# Patient Record
Sex: Female | Born: 1935 | Race: White | Hispanic: No | Smoking: Former smoker
Health system: Southern US, Community
[De-identification: ages and names within clinical notes are randomized; demographics above are authoritative.]

## PROBLEM LIST (undated history)

## (undated) DIAGNOSIS — I4891 Unspecified atrial fibrillation: Secondary | ICD-10-CM

## (undated) DIAGNOSIS — M1990 Unspecified osteoarthritis, unspecified site: Secondary | ICD-10-CM

## (undated) DIAGNOSIS — Z5189 Encounter for other specified aftercare: Secondary | ICD-10-CM

## (undated) DIAGNOSIS — K589 Irritable bowel syndrome without diarrhea: Secondary | ICD-10-CM

## (undated) DIAGNOSIS — R001 Bradycardia, unspecified: Secondary | ICD-10-CM

## (undated) DIAGNOSIS — I10 Essential (primary) hypertension: Secondary | ICD-10-CM

## (undated) DIAGNOSIS — E039 Hypothyroidism, unspecified: Secondary | ICD-10-CM

## (undated) DIAGNOSIS — K5790 Diverticulosis of intestine, part unspecified, without perforation or abscess without bleeding: Secondary | ICD-10-CM

## (undated) DIAGNOSIS — E785 Hyperlipidemia, unspecified: Secondary | ICD-10-CM

## (undated) DIAGNOSIS — N390 Urinary tract infection, site not specified: Secondary | ICD-10-CM

## (undated) DIAGNOSIS — K635 Polyp of colon: Secondary | ICD-10-CM

## (undated) DIAGNOSIS — M810 Age-related osteoporosis without current pathological fracture: Secondary | ICD-10-CM

## (undated) DIAGNOSIS — Z950 Presence of cardiac pacemaker: Secondary | ICD-10-CM

## (undated) HISTORY — DX: Essential (primary) hypertension: I10

## (undated) HISTORY — DX: Age-related osteoporosis without current pathological fracture: M81.0

## (undated) HISTORY — PX: COLONOSCOPY: SHX174

## (undated) HISTORY — DX: Encounter for other specified aftercare: Z51.89

## (undated) HISTORY — DX: Hypothyroidism, unspecified: E03.9

## (undated) HISTORY — DX: Bradycardia, unspecified: R00.1

## (undated) HISTORY — DX: Hyperlipidemia, unspecified: E78.5

## (undated) HISTORY — DX: Polyp of colon: K63.5

## (undated) HISTORY — DX: Urinary tract infection, site not specified: N39.0

## (undated) HISTORY — PX: OTHER SURGICAL HISTORY: SHX169

## (undated) HISTORY — DX: Diverticulosis of intestine, part unspecified, without perforation or abscess without bleeding: K57.90

## (undated) HISTORY — DX: Unspecified atrial fibrillation: I48.91

## (undated) HISTORY — DX: Irritable bowel syndrome, unspecified: K58.9

## (undated) HISTORY — DX: Presence of cardiac pacemaker: Z95.0

## (undated) HISTORY — DX: Unspecified osteoarthritis, unspecified site: M19.90

## (undated) HISTORY — PX: EYE SURGERY: SHX253

## (undated) HISTORY — PX: BREAST CYST ASPIRATION: SHX578

---

## 1993-03-18 ENCOUNTER — Encounter: Payer: Self-pay | Admitting: Internal Medicine

## 1998-04-01 ENCOUNTER — Ambulatory Visit (HOSPITAL_COMMUNITY): Admission: RE | Admit: 1998-04-01 | Discharge: 1998-04-01 | Payer: Self-pay | Admitting: Gastroenterology

## 1999-03-06 ENCOUNTER — Encounter: Admission: RE | Admit: 1999-03-06 | Discharge: 1999-03-06 | Payer: Self-pay | Admitting: Obstetrics and Gynecology

## 1999-03-06 ENCOUNTER — Encounter: Payer: Self-pay | Admitting: Obstetrics and Gynecology

## 1999-04-09 ENCOUNTER — Other Ambulatory Visit: Admission: RE | Admit: 1999-04-09 | Discharge: 1999-04-09 | Payer: Self-pay | Admitting: Obstetrics and Gynecology

## 2000-03-07 ENCOUNTER — Encounter: Admission: RE | Admit: 2000-03-07 | Discharge: 2000-03-07 | Payer: Self-pay | Admitting: Obstetrics and Gynecology

## 2000-03-07 ENCOUNTER — Encounter: Payer: Self-pay | Admitting: Obstetrics and Gynecology

## 2000-05-10 ENCOUNTER — Other Ambulatory Visit: Admission: RE | Admit: 2000-05-10 | Discharge: 2000-05-10 | Payer: Self-pay | Admitting: Obstetrics and Gynecology

## 2001-03-09 ENCOUNTER — Encounter: Admission: RE | Admit: 2001-03-09 | Discharge: 2001-03-09 | Payer: Self-pay | Admitting: Obstetrics and Gynecology

## 2001-03-09 ENCOUNTER — Encounter: Payer: Self-pay | Admitting: Obstetrics and Gynecology

## 2001-05-11 ENCOUNTER — Other Ambulatory Visit: Admission: RE | Admit: 2001-05-11 | Discharge: 2001-05-11 | Payer: Self-pay | Admitting: Obstetrics and Gynecology

## 2002-02-14 ENCOUNTER — Encounter: Payer: Self-pay | Admitting: Obstetrics and Gynecology

## 2002-02-14 ENCOUNTER — Encounter: Admission: RE | Admit: 2002-02-14 | Discharge: 2002-02-14 | Payer: Self-pay | Admitting: Obstetrics and Gynecology

## 2002-05-28 ENCOUNTER — Ambulatory Visit (HOSPITAL_COMMUNITY): Admission: RE | Admit: 2002-05-28 | Discharge: 2002-05-28 | Payer: Self-pay | Admitting: Ophthalmology

## 2002-05-28 ENCOUNTER — Encounter: Payer: Self-pay | Admitting: Ophthalmology

## 2003-02-18 ENCOUNTER — Encounter: Admission: RE | Admit: 2003-02-18 | Discharge: 2003-02-18 | Payer: Self-pay | Admitting: Obstetrics and Gynecology

## 2003-02-21 ENCOUNTER — Encounter: Admission: RE | Admit: 2003-02-21 | Discharge: 2003-02-21 | Payer: Self-pay | Admitting: Obstetrics and Gynecology

## 2004-03-05 ENCOUNTER — Encounter: Admission: RE | Admit: 2004-03-05 | Discharge: 2004-03-05 | Payer: Self-pay | Admitting: Obstetrics and Gynecology

## 2004-12-13 ENCOUNTER — Encounter: Admission: RE | Admit: 2004-12-13 | Discharge: 2004-12-13 | Payer: Self-pay | Admitting: Cardiology

## 2004-12-14 ENCOUNTER — Ambulatory Visit: Payer: Self-pay | Admitting: Internal Medicine

## 2005-03-08 ENCOUNTER — Encounter: Admission: RE | Admit: 2005-03-08 | Discharge: 2005-03-08 | Payer: Self-pay | Admitting: Obstetrics and Gynecology

## 2006-03-09 ENCOUNTER — Encounter: Admission: RE | Admit: 2006-03-09 | Discharge: 2006-03-09 | Payer: Self-pay | Admitting: Obstetrics and Gynecology

## 2007-02-03 ENCOUNTER — Emergency Department (HOSPITAL_COMMUNITY): Admission: EM | Admit: 2007-02-03 | Discharge: 2007-02-03 | Payer: Self-pay | Admitting: Family Medicine

## 2007-03-13 ENCOUNTER — Encounter: Admission: RE | Admit: 2007-03-13 | Discharge: 2007-03-13 | Payer: Self-pay | Admitting: Obstetrics and Gynecology

## 2007-05-12 ENCOUNTER — Ambulatory Visit: Payer: Self-pay | Admitting: Internal Medicine

## 2008-02-09 DIAGNOSIS — Z950 Presence of cardiac pacemaker: Secondary | ICD-10-CM

## 2008-02-09 HISTORY — DX: Presence of cardiac pacemaker: Z95.0

## 2008-02-09 HISTORY — PX: PACEMAKER INSERTION: SHX728

## 2008-03-19 ENCOUNTER — Encounter: Admission: RE | Admit: 2008-03-19 | Discharge: 2008-03-19 | Payer: Self-pay | Admitting: Obstetrics and Gynecology

## 2008-06-21 ENCOUNTER — Encounter: Admission: RE | Admit: 2008-06-21 | Discharge: 2008-06-21 | Payer: Self-pay | Admitting: Orthopaedic Surgery

## 2008-07-02 DIAGNOSIS — K5730 Diverticulosis of large intestine without perforation or abscess without bleeding: Secondary | ICD-10-CM | POA: Insufficient documentation

## 2008-07-02 DIAGNOSIS — E079 Disorder of thyroid, unspecified: Secondary | ICD-10-CM | POA: Insufficient documentation

## 2008-07-03 ENCOUNTER — Ambulatory Visit: Payer: Self-pay | Admitting: Internal Medicine

## 2008-07-03 DIAGNOSIS — R194 Change in bowel habit: Secondary | ICD-10-CM | POA: Insufficient documentation

## 2008-07-03 DIAGNOSIS — D689 Coagulation defect, unspecified: Secondary | ICD-10-CM | POA: Insufficient documentation

## 2008-07-03 DIAGNOSIS — R198 Other specified symptoms and signs involving the digestive system and abdomen: Secondary | ICD-10-CM | POA: Insufficient documentation

## 2008-07-03 DIAGNOSIS — K5900 Constipation, unspecified: Secondary | ICD-10-CM | POA: Insufficient documentation

## 2008-07-15 ENCOUNTER — Telehealth: Payer: Self-pay | Admitting: Internal Medicine

## 2008-07-17 ENCOUNTER — Ambulatory Visit: Payer: Self-pay | Admitting: Internal Medicine

## 2008-07-17 ENCOUNTER — Encounter: Payer: Self-pay | Admitting: Internal Medicine

## 2008-07-19 ENCOUNTER — Encounter: Payer: Self-pay | Admitting: Internal Medicine

## 2008-10-25 ENCOUNTER — Encounter: Admission: RE | Admit: 2008-10-25 | Discharge: 2008-10-25 | Payer: Self-pay | Admitting: Cardiology

## 2008-11-03 ENCOUNTER — Emergency Department (HOSPITAL_COMMUNITY): Admission: EM | Admit: 2008-11-03 | Discharge: 2008-11-04 | Payer: Self-pay | Admitting: Emergency Medicine

## 2008-11-12 ENCOUNTER — Telehealth: Payer: Self-pay | Admitting: Internal Medicine

## 2008-11-13 ENCOUNTER — Ambulatory Visit: Payer: Self-pay | Admitting: Gastroenterology

## 2008-11-13 DIAGNOSIS — R141 Gas pain: Secondary | ICD-10-CM | POA: Insufficient documentation

## 2008-11-13 DIAGNOSIS — R143 Flatulence: Secondary | ICD-10-CM

## 2008-11-13 DIAGNOSIS — R142 Eructation: Secondary | ICD-10-CM

## 2008-11-13 DIAGNOSIS — K5900 Constipation, unspecified: Secondary | ICD-10-CM | POA: Insufficient documentation

## 2008-11-14 ENCOUNTER — Telehealth: Payer: Self-pay | Admitting: Physician Assistant

## 2008-11-15 LAB — CONVERTED CEMR LAB
Calcium: 9.3 mg/dL (ref 8.4–10.5)
Eosinophils Absolute: 0.1 10*3/uL (ref 0.0–0.7)
GFR calc non Af Amer: 65.15 mL/min (ref >60)
HCT: 39.1 % (ref 36.0–46.0)
Hemoglobin: 13.2 g/dL (ref 12.0–15.0)
MCHC: 33.8 g/dL (ref 30.0–36.0)
Monocytes Relative: 14.7 % — ABNORMAL HIGH (ref 3.0–12.0)
Neutrophils Relative %: 61.5 % (ref 43.0–77.0)
Platelets: 247 10*3/uL (ref 150.0–400.0)
RBC: 4.28 M/uL (ref 3.87–5.11)
RDW: 12.5 % (ref 11.5–14.6)
WBC: 7.4 10*3/uL (ref 4.5–10.5)

## 2008-12-11 ENCOUNTER — Ambulatory Visit: Payer: Self-pay | Admitting: Internal Medicine

## 2008-12-11 DIAGNOSIS — K589 Irritable bowel syndrome without diarrhea: Secondary | ICD-10-CM | POA: Insufficient documentation

## 2008-12-11 DIAGNOSIS — Z860100 Personal history of colon polyps, unspecified: Secondary | ICD-10-CM | POA: Insufficient documentation

## 2008-12-11 DIAGNOSIS — I495 Sick sinus syndrome: Secondary | ICD-10-CM | POA: Insufficient documentation

## 2008-12-13 ENCOUNTER — Ambulatory Visit: Payer: Self-pay | Admitting: Cardiovascular Disease

## 2008-12-17 ENCOUNTER — Observation Stay (HOSPITAL_COMMUNITY): Admission: AD | Admit: 2008-12-17 | Discharge: 2008-12-18 | Payer: Self-pay | Admitting: Cardiology

## 2009-03-27 ENCOUNTER — Encounter: Admission: RE | Admit: 2009-03-27 | Discharge: 2009-03-27 | Payer: Self-pay | Admitting: Obstetrics and Gynecology

## 2009-09-25 ENCOUNTER — Ambulatory Visit: Payer: Self-pay | Admitting: Cardiology

## 2009-10-24 ENCOUNTER — Encounter: Payer: Self-pay | Admitting: Internal Medicine

## 2009-10-29 ENCOUNTER — Ambulatory Visit: Payer: Self-pay | Admitting: Cardiology

## 2009-11-12 ENCOUNTER — Ambulatory Visit: Payer: Self-pay | Admitting: Cardiology

## 2009-11-13 ENCOUNTER — Ambulatory Visit: Payer: Self-pay | Admitting: Cardiology

## 2009-12-09 ENCOUNTER — Ambulatory Visit: Payer: Self-pay | Admitting: Cardiology

## 2009-12-09 IMAGING — CR DG CHEST 2V
2 series · 2 of 2 positions shown · non-contrast
Comparison: 11/04/2008.

CLINICAL DATA: 75-year-old female with pacemaker placement.

CHEST - 2 VIEW

[w chest pa]
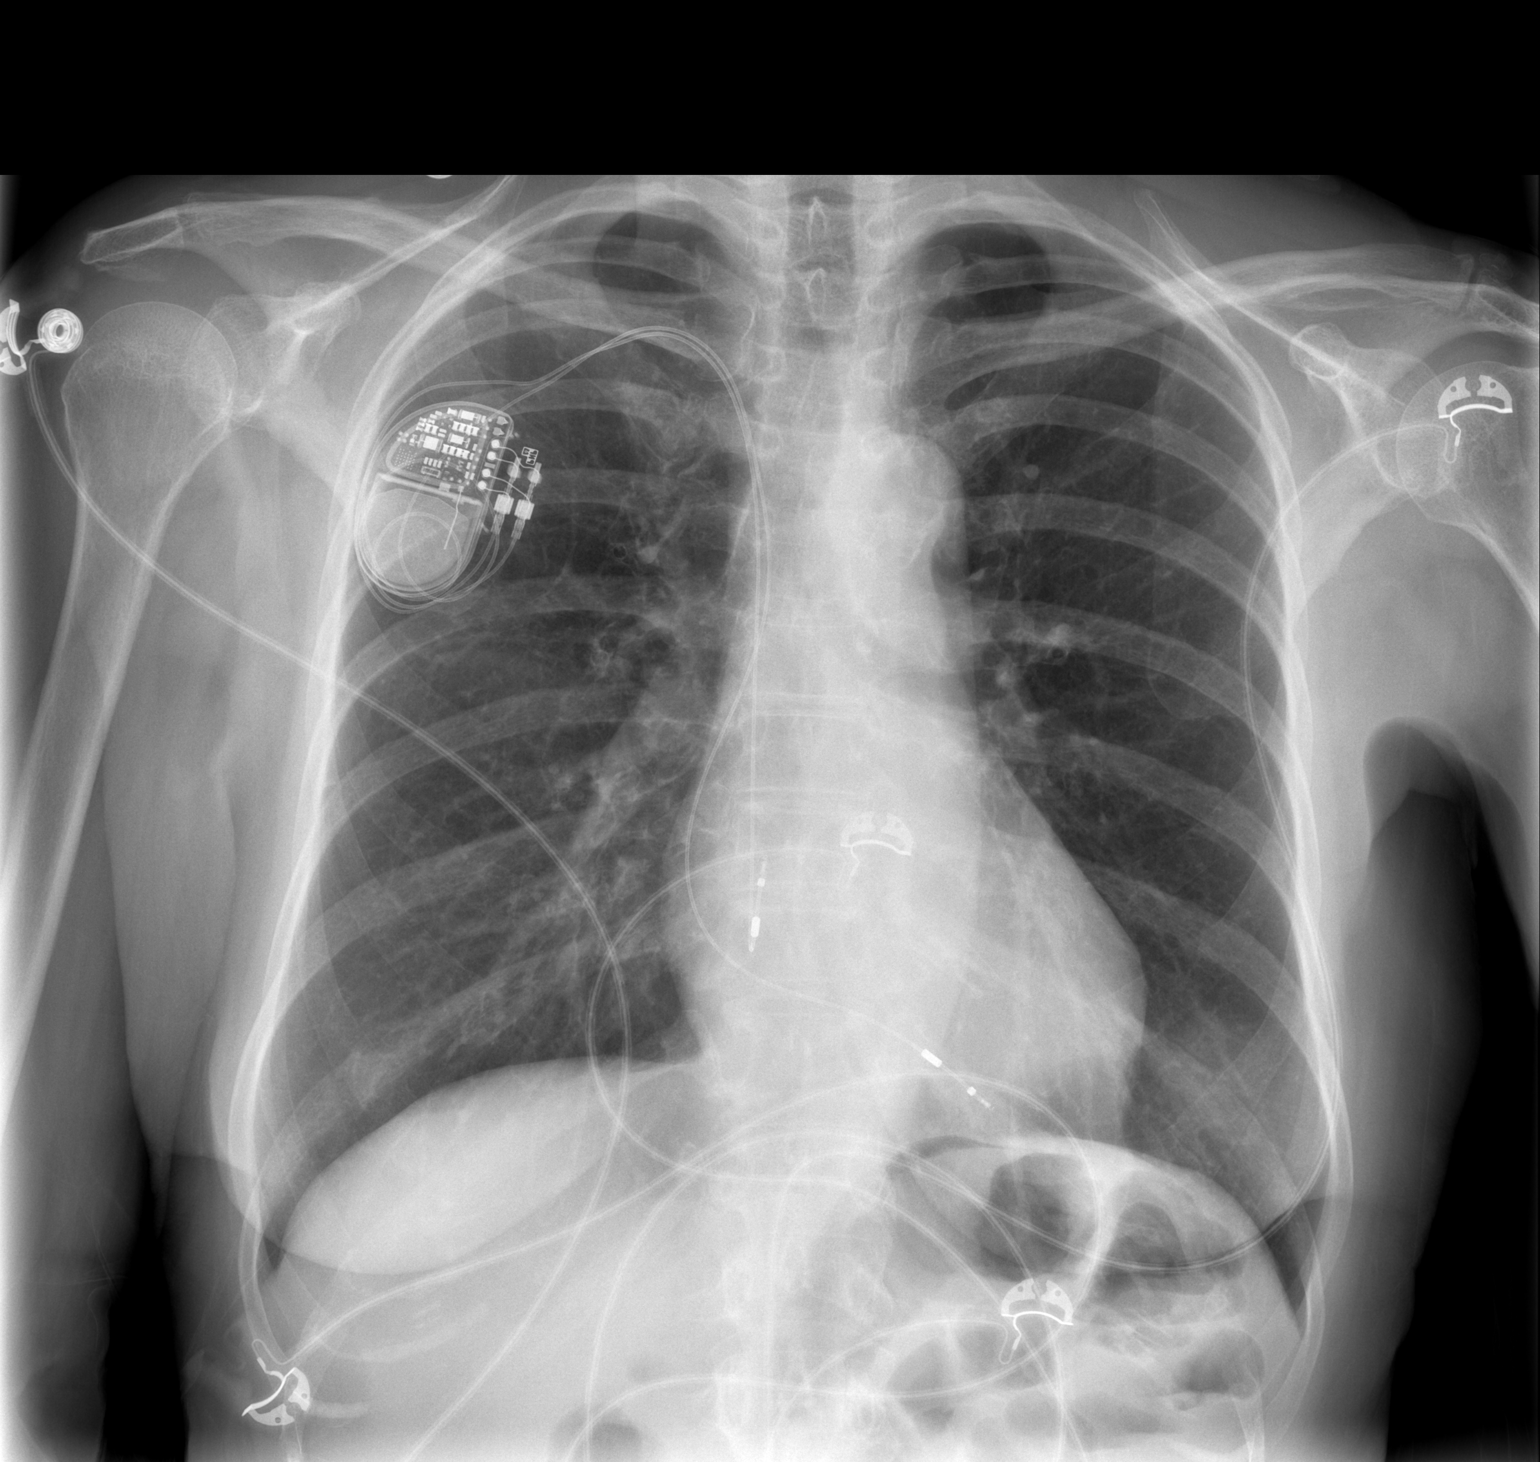

[w chest lat]
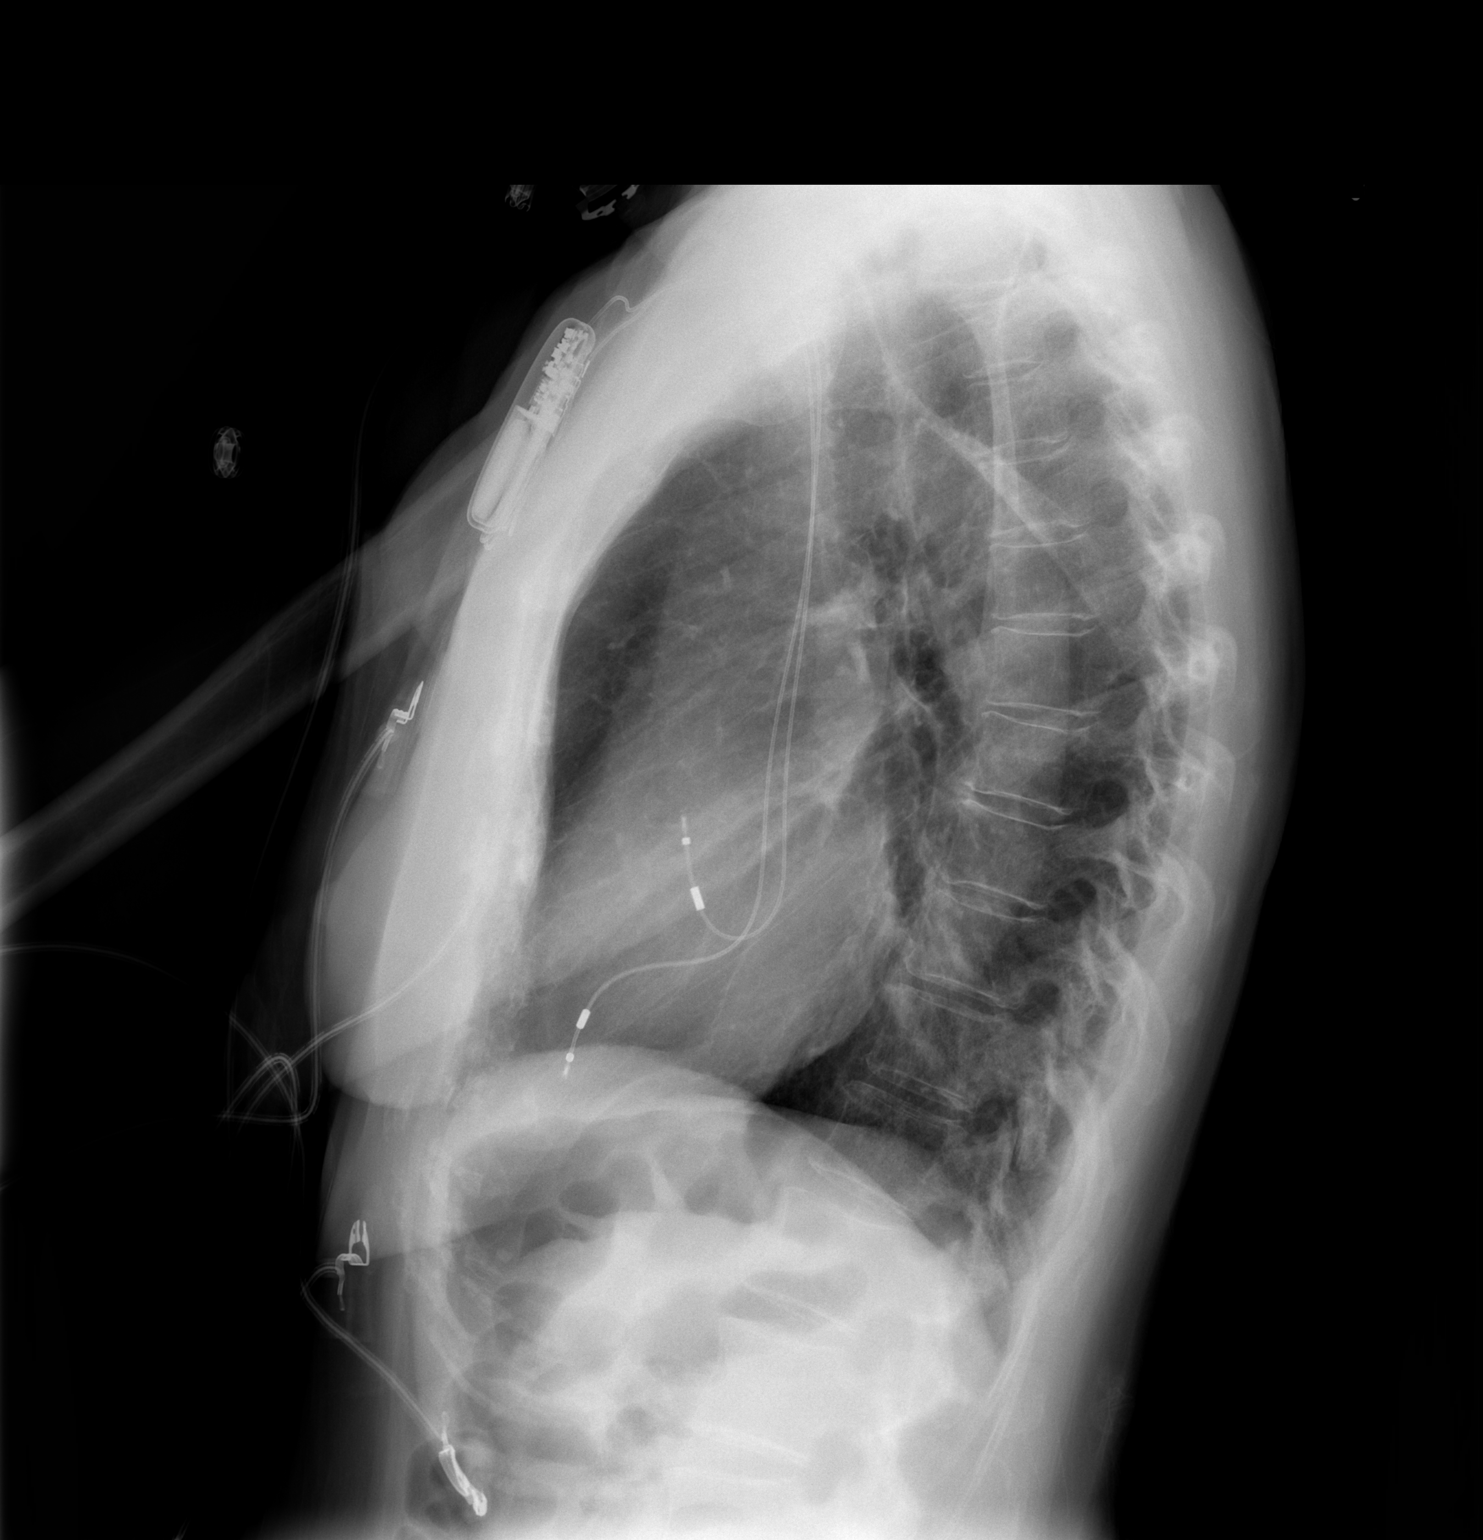

[2 of 2 positions shown; findings below may reference images not displayed]

FINDINGS: New right chest dual lead transvenous cardiac pacemaker.
No adverse features.  No pneumothorax. Stable cardiomegaly and
mediastinal contours.  No pulmonary edema, pleural effusion, or
acute airspace opacity. Stable visualized osseous structures.
IMPRESSION: 1.  Right chest cardiac pacemaker.  No adverse features.
2. No acute cardiopulmonary abnormality.

## 2010-01-05 ENCOUNTER — Encounter: Admission: RE | Admit: 2010-01-05 | Discharge: 2010-01-05 | Payer: Self-pay | Admitting: Internal Medicine

## 2010-01-09 ENCOUNTER — Ambulatory Visit: Payer: Self-pay | Admitting: Cardiology

## 2010-01-26 ENCOUNTER — Ambulatory Visit: Payer: Self-pay | Admitting: Cardiology

## 2010-01-29 ENCOUNTER — Telehealth (INDEPENDENT_AMBULATORY_CARE_PROVIDER_SITE_OTHER): Payer: Self-pay | Admitting: *Deleted

## 2010-01-29 ENCOUNTER — Ambulatory Visit: Payer: Self-pay | Admitting: Internal Medicine

## 2010-02-16 ENCOUNTER — Ambulatory Visit: Payer: Self-pay | Admitting: Cardiology

## 2010-02-18 ENCOUNTER — Encounter (INDEPENDENT_AMBULATORY_CARE_PROVIDER_SITE_OTHER): Payer: Self-pay | Admitting: *Deleted

## 2010-02-28 ENCOUNTER — Other Ambulatory Visit: Payer: Self-pay | Admitting: Obstetrics and Gynecology

## 2010-02-28 DIAGNOSIS — Z1239 Encounter for other screening for malignant neoplasm of breast: Secondary | ICD-10-CM

## 2010-03-10 NOTE — Miscellaneous (Signed)
Summary: Device preload  Clinical Lists Changes  Observations: Added new observation of PPM INDICATN: Sick sinus syndrome (10/24/2009 16:45) Added new observation of MAGNET RTE: BOL 85 ERI 65 (10/24/2009 16:45) Added new observation of PPMLEADSTAT2: active (10/24/2009 16:45) Added new observation of PPMLEADSER2: 161096  (10/24/2009 16:45) Added new observation of PPMLEADMOD2: 4470  (10/24/2009 16:45) Added new observation of PPMLEADDOI2: 12/17/2008  (10/24/2009 16:45) Added new observation of PPMLEADLOC2: 4470  (10/24/2009 16:45) Added new observation of PPMLEADSTAT1: active  (10/24/2009 16:45) Added new observation of PPMLEADSER1: 045409  (10/24/2009 16:45) Added new observation of PPMLEADMOD1: 4469  (10/24/2009 16:45) Added new observation of PPMLEADDOI1: 12/17/2008  (10/24/2009 16:45) Added new observation of PPMLEADLOC1: RA  (10/24/2009 16:45) Added new observation of PPM IMP MD: Duffy Rhody Tennant,MD  (10/24/2009 16:45) Added new observation of PPM DOI: 12/17/2008  (10/24/2009 16:45) Added new observation of PPM SERL#: [id number]  (10/24/2009 16:45) Added new observation of PPM MODL#: [id number]  (10/24/2009 16:45) Added new observation of PACEMAKERMFG: Medtronic  (10/24/2009 16:45) Added new observation of PACEMAKER MD: Hillis Range, MD  (10/24/2009 16:45)      PPM Specifications Following MD:  Hillis Range, MD     PPM Vendor:  Medtronic     PPM Model Number:  [id number]     PPM Serial Number:  [id number] PPM DOI:  12/17/2008     PPM Implanting MD:  Rolla Plate  Lead 1    Location: RA     DOI: 12/17/2008     Model #: 4469     Serial #: [id number]     Status: active Lead 2    Location: 4470     DOI: 12/17/2008     Model #: 4470     Serial #: [id number]     Status: active  Magnet Response Rate:  BOL 85 ERI 65  Indications:  Sick sinus syndrome

## 2010-03-12 NOTE — Progress Notes (Signed)
Summary: question transmission  Phone Note Call from Patient Call back at Home Phone [phone]   Caller: Patient Reason for Call: Talk to Nurse Summary of Call: pt has question re transmission. Initial call taken by: Roe Coombs,  January 29, 2010 2:07 PM  Follow-up for Phone Call        transmission recieved, patient aware. Follow-up by: Altha Harm, LPN,  January 30, 2010 8:18 AM

## 2010-03-12 NOTE — Cardiovascular Report (Signed)
Summary: Office Visit Remote   Office Visit Remote   Imported By: Roderic Ovens 02/20/2010 10:57:04  _____________________________________________________________________  External Attachment:    Type:   Image     Comment:   External Document

## 2010-03-12 NOTE — Letter (Signed)
Summary: Remote Device Check  [facility], Main Office  [address]   [place], [place] [address]   Phone: [phone]  Fax: [phone]     February 18, 2010 MRN: [id number]   Kathryn Gibson [address], [address] [place], [place]  [address]   Dear [name],   Your remote transmission was recieved and reviewed by your physician.  All diagnostics were within normal limits for you.   __X____Your next office visit is scheduled for:  04-16-10 @ 1200 with Dr Johney Frame.                                Sincerely,  Vella Kohler

## 2010-03-16 ENCOUNTER — Encounter (INDEPENDENT_AMBULATORY_CARE_PROVIDER_SITE_OTHER): Payer: Medicare Other

## 2010-03-16 DIAGNOSIS — Z7901 Long term (current) use of anticoagulants: Secondary | ICD-10-CM

## 2010-03-16 DIAGNOSIS — I4891 Unspecified atrial fibrillation: Secondary | ICD-10-CM

## 2010-03-30 ENCOUNTER — Ambulatory Visit
Admission: RE | Admit: 2010-03-30 | Discharge: 2010-03-30 | Disposition: A | Discharge status: Home / Self Care | Payer: Medicare Other | Source: Ambulatory Visit | Attending: Obstetrics and Gynecology | Admitting: Obstetrics and Gynecology

## 2010-03-30 DIAGNOSIS — Z1239 Encounter for other screening for malignant neoplasm of breast: Secondary | ICD-10-CM

## 2010-04-15 DIAGNOSIS — R002 Palpitations: Secondary | ICD-10-CM | POA: Insufficient documentation

## 2010-04-15 DIAGNOSIS — E039 Hypothyroidism, unspecified: Secondary | ICD-10-CM | POA: Insufficient documentation

## 2010-04-16 ENCOUNTER — Encounter (INDEPENDENT_AMBULATORY_CARE_PROVIDER_SITE_OTHER): Payer: Medicare Other

## 2010-04-16 ENCOUNTER — Encounter: Payer: Self-pay | Admitting: Internal Medicine

## 2010-04-16 ENCOUNTER — Encounter (INDEPENDENT_AMBULATORY_CARE_PROVIDER_SITE_OTHER): Payer: Medicare Other | Admitting: Internal Medicine

## 2010-04-16 DIAGNOSIS — I4891 Unspecified atrial fibrillation: Secondary | ICD-10-CM

## 2010-04-16 DIAGNOSIS — I495 Sick sinus syndrome: Secondary | ICD-10-CM

## 2010-04-16 DIAGNOSIS — I10 Essential (primary) hypertension: Secondary | ICD-10-CM

## 2010-04-16 DIAGNOSIS — Z7901 Long term (current) use of anticoagulants: Secondary | ICD-10-CM

## 2010-04-18 DIAGNOSIS — I10 Essential (primary) hypertension: Secondary | ICD-10-CM | POA: Insufficient documentation

## 2010-04-21 NOTE — Assessment & Plan Note (Signed)
Summary: pacer ck/medtronic/kl   Visit Type:  Initial Consult Referring Provider:  Dr Patty Sermons Primary Provider:  Larina Earthly, MD   History of Present Illness: Kathryn Gibson is a pleasant 77 yo WF with a h/o atrial fibrillation and tachycardia/bradycardia syndrome s/p PPM (MDT) by Dr Deborah Chalk  12/17/08 who presents today to establish care in the EP device clinic.  She reports doing well since his pacemaker was implanted.  She remains active despite his age.  She lives at [facility] and exercises there frequently.  She reports rare palpitations.  She denies chest pain, shortness of breath, orthopnea, PND, lower extremity edema, dizziness, presyncope, syncope, or neurologic sequela. The patient is tolerating medications without difficulties and is otherwise without complaint today.   Current Medications (verified): 1)  Coumadin 5 Mg Tabs (Warfarin Sodium) .... Take As Directed 2)  Pacerone 100 Mg Tabs (Amiodarone Hcl) .... 1/2 By Mouth Dialy 3)  Ambien 10 Mg Tabs (Zolpidem Tartrate) .Marland Kitchen.. 1 By Mouth At Bedtime 4)  Miralax  Powd (Polyethylene Glycol 3350) .... Mix Two Scoops in Water and Drink At Night 5)  Synthroid 50 Mcg Tabs (Levothyroxine Sodium) .... Take One By Mouth Every Other Day and 1 1/2 Tablet Every Other Day 6)  Multivitamins  Tabs (Multiple Vitamin) .Marland Kitchen.. 1 Tablet By Mouth Once Daily 7)  Vitamin D 1000 Unit Tabs (Cholecalciferol) .... One Tablet By Mouth Once Daily 8)  Calcium 600 600 Mg Tabs (Calcium Carbonate) .Marland Kitchen.. 1 Tablet By Mouth Once Daily 9)  Aspirin 81 Mg Tbec (Aspirin) .Marland Kitchen.. 1 Tablet By Mouth Once Daily 10)  Hyomax-Sl 0.125 Mg Subl (Hyoscyamine Sulfate) .... As Needed 11)  Vitamin C 1000 Mg Tabs (Ascorbic Acid) .... Take 1 Tablet By Mouth Once A Day 12)  Tylenol Extra Strength 500 Mg Tabs (Acetaminophen) .... As Needed 13)  Bromday 0.09 % (Daily) Soln (Bromfenac Sodium) .... Uad 14)  Fish Oil  Oil (Fish Oil) .... Take 1 Tablet By Mouth Once A Day 15)  Align  Caps (Probiotic  Product) .Marland Kitchen.. 1 Capsule By Mouth Once Daily X 2 Weeks 16)  Toprol Xl 50 Mg Xr24h-Tab (Metoprolol Succinate) .... Daily 17)  Calan Sr 240 Mg Cr-Tabs (Verapamil Hcl) .Marland Kitchen.. 1 By Mouth Daily  Allergies (verified): No Known Drug Allergies  Past History:  Past Medical History: BRADYCARDIA-TACHYCARDIA SYNDROME s/p PPM (MDT) HYPERTENSION  Paroxymal atrial fibrillation HYPOTHYROIDISM IRRITABLE BOWEL SYNDROME  CONSTIPATION COLONIC POLYPS, ADENOMATOUS, HX OF   Past Surgical History: s/p PPM (MDT) by Dr Deborah Chalk 2010  Family History: Reviewed history from 12/11/2008 and no changes required. No FH of Colon Cancer Family History of Heart Disease: Father, MI, Mother-CHF paternal grandmother cancer unknown type  Social History: Married, 2 boys Retired Patient is a former smoker. Quit 30 years ago Alcohol Use - yes-Red wine w/dinner Illicit Drug Use - no  Resides at [facility]  Review of Systems       All systems are reviewed and negative except as listed in the HPI.   Vital Signs:  Patient profile:   77 year old female Height:      61.5 inches Weight:      119 pounds BMI:     22.20 Pulse rate:   60 / minute Resp:     16 per minute BP sitting:   149 / 68  (right arm)  Vitals Entered By: Marrion Coy, CNA (April 16, 2010 12:04 PM)  Physical Exam  General:  Well developed, well nourished, in no acute distress. Head:  normocephalic  and atraumatic Eyes:  PERRLA/EOM intact; conjunctiva and lids normal. Mouth:  Teeth, gums and palate normal. Oral mucosa normal. Neck:  supple Chest Wall:  pacemaker pocket is well healed Lungs:  Clear bilaterally to auscultation and percussion. Heart:  RRR, no m/r/g Abdomen:  Bowel sounds positive; abdomen soft and non-tender without masses, organomegaly, or hernias noted. No hepatosplenomegaly. Msk:  Back normal, normal gait. Muscle strength and tone normal. Extremities:  No clubbing or cyanosis. Neurologic:  Alert and oriented x 3. Skin:   Intact without lesions or rashes. Psych:  Normal affect.   PPM Specifications Following MD:  Kathryn Range, MD     PPM Vendor:  Medtronic     PPM Model Number:  [id number]     PPM Serial Number:  [id number] PPM DOI:  12/17/2008     PPM Implanting MD:  Rolla Plate  Lead 1    Location: RA     DOI: 12/17/2008     Model #: 4469     Serial #: [id number]     Status: active Lead 2    Location: 4470     DOI: 12/17/2008     Model #: 4470     Serial #: [id number]     Status: active  Magnet Response Rate:  BOL 85 ERI 65  Indications:  Sick sinus syndrome  MD Comments:  see scanned report in [facility]  Impression & Recommendations:  Problem # 1:  BRADYCARDIA-TACHYCARDIA SYNDROME (ICD-427.81) normal pacemaker function no changes today see scanned report from [facility]  Problem # 2:  ESSENTIAL HYPERTENSION, BENIGN (ICD-401.1) above goal salt restriction no medicine changes today  Patient Instructions: 1)  Your physician wants you to follow-up in:12 months with Dr.Jermani Eberlein   You will receive a reminder letter in the mail two months in advance. If you don't receive a letter, please call our office to schedule the follow-up appointment.

## 2010-05-07 ENCOUNTER — Other Ambulatory Visit: Payer: Self-pay | Admitting: Cardiology

## 2010-05-07 DIAGNOSIS — I4891 Unspecified atrial fibrillation: Secondary | ICD-10-CM

## 2010-05-07 NOTE — Cardiovascular Report (Signed)
Summary: Office Visit   Office Visit   Imported By: Roderic Ovens 05/01/2010 14:39:36  _____________________________________________________________________  External Attachment:    Type:   Image     Comment:   External Document

## 2010-05-07 NOTE — Telephone Encounter (Signed)
escribe request  

## 2010-05-13 LAB — PROTIME-INR
INR: 1.18 (ref 0.00–1.49)
Prothrombin Time: 14.9 seconds (ref 11.6–15.2)

## 2010-05-15 LAB — CBC
HCT: 38 % (ref 36.0–46.0)
Hemoglobin: 13 g/dL (ref 12.0–15.0)
MCHC: 34.2 g/dL (ref 30.0–36.0)
MCV: 91.5 fL (ref 78.0–100.0)
Platelets: 220 10*3/uL (ref 150–400)
RBC: 4.15 MIL/uL (ref 3.87–5.11)
RDW: 13.3 % (ref 11.5–15.5)
WBC: 5.9 10*3/uL (ref 4.0–10.5)

## 2010-05-15 LAB — DIFFERENTIAL
Basophils Absolute: 0 10*3/uL (ref 0.0–0.1)
Eosinophils Relative: 1 % (ref 0–5)
Neutro Abs: 4.3 10*3/uL (ref 1.7–7.7)
Neutrophils Relative %: 72 % (ref 43–77)

## 2010-05-15 LAB — BASIC METABOLIC PANEL
BUN: 13 mg/dL (ref 6–23)
Chloride: 93 mEq/L — ABNORMAL LOW (ref 96–112)
Potassium: 4 mEq/L (ref 3.5–5.1)
Sodium: 130 mEq/L — ABNORMAL LOW (ref 135–145)

## 2010-05-15 LAB — POCT CARDIAC MARKERS
CKMB, poc: 1.8 ng/mL (ref 1.0–8.0)
Myoglobin, poc: 70.6 ng/mL (ref 12–200)

## 2010-05-18 ENCOUNTER — Encounter: Payer: Self-pay | Admitting: Cardiology

## 2010-05-19 ENCOUNTER — Encounter: Payer: Self-pay | Admitting: Cardiology

## 2010-05-19 ENCOUNTER — Ambulatory Visit (INDEPENDENT_AMBULATORY_CARE_PROVIDER_SITE_OTHER): Payer: Medicare Other | Admitting: Cardiology

## 2010-05-19 VITALS — BP 140/74 | HR 70 | Ht 61.0 in | Wt 117.6 lb

## 2010-05-19 DIAGNOSIS — I495 Sick sinus syndrome: Secondary | ICD-10-CM

## 2010-05-19 DIAGNOSIS — I10 Essential (primary) hypertension: Secondary | ICD-10-CM

## 2010-05-19 DIAGNOSIS — I4891 Unspecified atrial fibrillation: Secondary | ICD-10-CM

## 2010-05-19 NOTE — Assessment & Plan Note (Signed)
The patient has a dual-chamber pacemaker for tachybradycardia syndrome.  She had a recent pacemaker checkup with Dr. Jenel Lucks office.  No new problems encountered.

## 2010-05-19 NOTE — Assessment & Plan Note (Signed)
The patient has not been aware of any spikes in her blood pressure recently.  She is tolerating her present medications without difficulty.

## 2010-05-19 NOTE — Progress Notes (Signed)
History of Present Illness: This 77 year old woman is seen for a scheduled followup office visit.  She has a past history of atrial fibrillation and sick sinus syndrome.  She's had a dual-chamber pacemaker implanted for tachybradycardia syndrome on 12/17/08.  It is a Medtronic pacemaker.  The patient does have a history of hypothyroidism.  She maintains on low dose amiodarone 100 mg a day.  She's had fluctuating blood pressures which have been less extreme recently.  She does not have known coronary artery disease and she had a normal stress test nuclear study in 2007 and her ejection fraction was 70%.  Current Outpatient Prescriptions  Medication Sig Dispense Refill  . amiodarone (PACERONE) 200 MG tablet Take 200 mg by mouth daily. A half tablet daily po        . aspirin 81 MG tablet Take 81 mg by mouth daily.        . Bromfenac Sodium (BROMDAY OP) Apply to eye.        . calcium carbonate 200 MG capsule Take 250 mg by mouth daily.        . cholecalciferol (VITAMIN D) 1000 UNITS tablet Take 1,000 Units by mouth daily.        . fish oil-omega-3 fatty acids 1000 MG capsule Take 1 g by mouth.        . levothyroxine (SYNTHROID, LEVOTHROID) 50 MCG tablet Take 50 mcg by mouth daily. po and 75 mcg po alternate days       . metoprolol succinate (TOPROL-XL) 25 MG 24 hr tablet Take 25 mg by mouth daily.        . Multiple Vitamin (MULTIVITAMIN) capsule Take 1 capsule by mouth daily.        . Probiotic Product (ALIGN PO) Take by mouth as needed.       . verapamil (CALAN-SR) 240 MG CR tablet Take 240 mg by mouth at bedtime.        Marland Kitchen warfarin (COUMADIN) 5 MG tablet TAKE 1 TABLET (5 MG) FOR 5 DAYS AND TAKE ONE-HALF (1/2) TABLET (2.5MG ) FOR 2 DAYS OR AS DIRECTED  90 tablet  2  . zolpidem (AMBIEN) 10 MG tablet Take 10 mg by mouth at bedtime as needed.        . CycloSPORINE (RESTASIS OP) Apply to eye.        . fexofenadine (ALLEGRA) 180 MG tablet Take 180 mg by mouth as needed.          No Known  Allergies  Patient Active Problem List  Diagnoses  . UNSPECIFIED DISORDER OF THYROID  . COAGULOPATHY  . BRADYCARDIA-TACHYCARDIA SYNDROME  . DIVERTICULOSIS, COLON  . CONSTIPATION  . CONSTIPATION, CHRONIC  . IRRITABLE BOWEL SYNDROME  . ABDOMINAL BLOATING  . CHANGE IN BOWELS  . COLONIC POLYPS, ADENOMATOUS, HX OF  . HYPOTHYROIDISM  . Essential hypertension, benign  . PALPITATIONS  . Atrial fibrillation    History  Smoking status  . Former Smoker  . Types: Cigarettes  . Quit date: 02/09/1980  Smokeless tobacco  . Not on file    History  Alcohol Use  . 1.2 oz/week  . 2 Glasses of wine per week    No family history on file.  Review of Systems: Constitutional: no fever chills diaphoresis or fatigue or change in weight.  Head and neck: no hearing loss, no epistaxis, no photophobia or visual disturbance. Respiratory: No cough, shortness of breath or wheezing. Cardiovascular: No chest pain peripheral edema, palpitations. Gastrointestinal: No abdominal distention, no abdominal  pain, no change in bowel habits hematochezia or melena. Genitourinary: No dysuria, no frequency, no urgency, no nocturia. Musculoskeletal:No arthralgias, no back pain, no gait disturbance or myalgias. Neurological: No dizziness, no headaches, no numbness, no seizures, no syncope, no weakness, no tremors. Hematologic: No lymphadenopathy, no easy bruising. Psychiatric: No confusion, no hallucinations, no sleep disturbance.    Physical Exam: Filed Vitals:   05/19/10 1436  BP: 140/74  Pulse: 70  Her weight is 117 pounds.  The general appearance reveals a well-developed well-nourished woman in no distress.Pupils equal and reactive.   Extraocular Movements are full.  There is no scleral icterus.  The mouth and pharynx are normal.  The neck is supple.  The carotids reveal no bruits.  The jugular venous pressure is normal.  The thyroid is not enlarged.  There is no lymphadenopathy.The chest is clear to  percussion and auscultation. There are no rales or rhonchi. Expansion of the chest is symmetrical.The precordium is quiet.  The first heart sound is normal.  The second heart sound is physiologically split.  There is no murmur gallop rub or click.  There is no abnormal lift or heave.  The rhythm is regular today.The abdomen is soft and nontender. Bowel sounds are normal. The liver and spleen are not enlarged. There Are no abdominal masses. There are no bruits.The pedal pulses are good.  There is no phlebitis or edema.  There is no cyanosis or clubbing.Strength is normal and symmetrical in all extremities.  There is no lateralizing weakness.  There are no sensory deficits.   Assessment / Plan:  Her INR today is 2.1 and she'll continue same medication.  Recheck at monthly intervals and we'll see her back in 3 months for An office visit

## 2010-05-19 NOTE — Assessment & Plan Note (Signed)
Since last visit the patient has not been aware of any fibrillation.  She has been under a lot of stress.  Her husband has been hospitalized and is still in the hospital and is scheduled to get a PEG tube tomorrow.  Because of this her schedule has been less predictable.  She still has been able to get to her yoga class each week.

## 2010-06-16 ENCOUNTER — Ambulatory Visit (INDEPENDENT_AMBULATORY_CARE_PROVIDER_SITE_OTHER): Payer: Medicare Other | Admitting: *Deleted

## 2010-06-16 DIAGNOSIS — I4891 Unspecified atrial fibrillation: Secondary | ICD-10-CM

## 2010-06-16 LAB — POCT INR: INR: 2.1

## 2010-06-23 NOTE — Assessment & Plan Note (Signed)
[facility]                         GASTROENTEROLOGY OFFICE NOTE   NAME:[name], Kathryn Gibson                      MRN:          [id number]  DATE:05/12/2007                            DOB:          28-Feb-1933    HISTORY:  The patient presents today with complaints of constipation and  gas.  These are chronic problems.  She was initially evaluated in this  office December 14, 2004, for the same.  She is 77 years old and has a  history of hypertension, hypothyroidism, and atrial fibrillation for  which she is on Coumadin.  She is also known to have constipation,  predominant irritable bowel syndrome for which she had seen Dr. Sharrell Ku.  Her last complete colonoscopy was performed in April of 2005.  One diminutive polyp removed.  Follow up in 5 years recommended.  The  patient presents today stating that her bowel habits have been  acceptable on once daily MiraLax until the past 6 or 8 months.  Occasionally she will need to use MiraLax more often or even Dulcolax.  She has gone as much as 2 days without a bowel movement.  She also  complains of increased intestinal gas and belching.  She denies nausea,  vomiting, abdominal pain or GI bleeding.  Her appetite and weight are  stable.  There has been no interval family history of colon cancer.  Her  medications are stable.   CURRENT MEDICATIONS:  Coumadin, Toprol XL, amiodarone, Ambien, MiraLax,  Synthroid, hydrochlorothiazide, multivitamin, Vitamin D, Calcium,  vitamin C, Baby Aspirin, Diovan, Norvasc, and Restasis eye drops.   PHYSICAL EXAMINATION:  Finds a well-appearing female in no acute  distress.  Her blood pressure is 112/66, heart rate is 40 and regular,  her weight is 126 pounds.  HEENT:  Sclerae are anicteric.  Conjunctivae are pink.  Oral mucosa is  intact.  LUNGS:  Clear.  HEART:  Regular.  ABDOMEN:  Soft, without tenderness, mass or hernia, good bowel sounds  heard.   IMPRESSION:  1.  Constipation, predominant irritable bowel syndrome.  2. Gas and bloating.  3. History of colon polyps.   RECOMMENDATIONS:  1. Increase MiraLax to 2 scoops daily.  Titrate to need.  2. Empiric course of the probiotic Align 1 daily for two weeks.      Samples given.  Can be repeated on demand.  3. Due for surveillance colonoscopy April of 2010.  4. Ongoing general medical care with Dr. Felipa Eth.     Wilhemina Bonito. Marina Goodell, MD  Electronically Signed    JNP/MedQ  DD: 05/12/2007  DT: 05/12/2007  Job #: [id number]   cc:   Larina Earthly, M.D.

## 2010-06-26 NOTE — Op Note (Signed)
NAME:  Kathryn Gibson, Kathryn Gibson                         ACCOUNT NO.:  [id number]   MEDICAL RECORD NO.:  [id number]                   PATIENT TYPE:  OIB   LOCATION:  2873                                 FACILITY:  [facility]   PHYSICIAN:  Alford Highland. Rankin, M.D.                DATE OF BIRTH:  March 02, 1933   DATE OF PROCEDURE:  05/28/2002  DATE OF DISCHARGE:                                 OPERATIVE REPORT   PREOPERATIVE DIAGNOSIS:  Epiretinal membrane, left eye.   POSTOPERATIVE DIAGNOSIS:  Epiretinal membrane.   PROCEDURE:  Posterior vitrectomy with membrane peel, left eye.   ANESTHESIA:  Local retrobulbar with monitored anesthesia control.   INDICATION FOR PROCEDURE:  The patient is a 77 year old woman with profound  vision loss of the left eye affecting her activities of daily living and  contributing to poor functional reading capability.  She understands this is  an attempt to release the macular traction so as to allow visual acuity  improvement.  She understands the risks of anesthesia, including the rare  occurrence of death, and losses to the eye including but not limited to  hemorrhage, infection, scarring, need for another surgery, no change in  vision, loss of vision, and progressive disease despite intervention.  An  appropriate signed consent was obtained.   DESCRIPTION OF PROCEDURE:  She was taken to the operating room.  In the  operating room appropriate monitoring was followed by mild sedation.  Marcaine 0.75% delivered 5 mL retrobulbar followed by an additional 5 mL  laterally in the fashion of modified Darel Hong.  The left periocular region  was sterilely prepped and draped in the usual ophthalmic fashion.  A lid  speculum applied.  A conjunctival peritomy fashioned temporally and  superonasally.  A 4 mm infusion was secured 3.5 mm posterior to the limbus  in the inferotemporal quadrant.  Placement in the vitreous cavity verified  visually.  A superior sclerotomy was then  fashioned.  The wall microscope  was placed in position with BIOM attached.  Core vitrectomy was then begun.  Iatrogenic posterior vitreous detachment was necessary.  Vitreous skirt  elevated and trimmed 360 degrees.  Epiretinal membrane overlying the macular  region was then engaged with [facility] forceps.  The membrane was removed in a  continuous single sheet.  It was clear that most of this was internal  limiting membrane, as the typical whitening of the underlying retina.  No  macular hole formation was noted.   The peripheral retina was inspected using scleral depression techniques.   At this time the vitreous skirt was then cleaned further.  Anterior hyaloid  was confirmed to be removed.  At this time superior sclerotomies were closed  with 7-0 Vicryl suture.  The infusion removed and similarly closed with 7-0  Vicryl suture.  Conjunctiva closed with 7-0 Vicryl suture.  Subconjunctival  injection of antibiotic and steroid applied.  The patient  had a sterile  patch and Fox shield applied in the operating room.  She was returned to  short stay in excellent condition.  Then she will be discharged home as an  outpatient.                                               Alford Highland Rankin, M.D.    GAR/MEDQ  D:  05/28/2002  T:  05/29/2002  Job:  [id number]

## 2010-07-14 ENCOUNTER — Ambulatory Visit (INDEPENDENT_AMBULATORY_CARE_PROVIDER_SITE_OTHER): Payer: Medicare Other | Admitting: *Deleted

## 2010-07-14 DIAGNOSIS — I4891 Unspecified atrial fibrillation: Secondary | ICD-10-CM

## 2010-07-16 ENCOUNTER — Encounter: Payer: Medicare Other | Admitting: *Deleted

## 2010-07-21 ENCOUNTER — Encounter: Payer: Medicare Other | Admitting: *Deleted

## 2010-07-22 ENCOUNTER — Encounter: Payer: Medicare Other | Admitting: *Deleted

## 2010-07-23 ENCOUNTER — Other Ambulatory Visit: Payer: Self-pay | Admitting: Internal Medicine

## 2010-07-23 ENCOUNTER — Ambulatory Visit (INDEPENDENT_AMBULATORY_CARE_PROVIDER_SITE_OTHER): Payer: Medicare Other | Admitting: *Deleted

## 2010-07-23 DIAGNOSIS — I4891 Unspecified atrial fibrillation: Secondary | ICD-10-CM

## 2010-07-23 DIAGNOSIS — I495 Sick sinus syndrome: Secondary | ICD-10-CM

## 2010-07-27 NOTE — Progress Notes (Signed)
Pacer remote check  

## 2010-08-03 ENCOUNTER — Ambulatory Visit (INDEPENDENT_AMBULATORY_CARE_PROVIDER_SITE_OTHER): Payer: Medicare Other | Admitting: Cardiology

## 2010-08-03 ENCOUNTER — Encounter: Payer: Self-pay | Admitting: Cardiology

## 2010-08-03 ENCOUNTER — Ambulatory Visit (INDEPENDENT_AMBULATORY_CARE_PROVIDER_SITE_OTHER): Payer: Medicare Other | Admitting: *Deleted

## 2010-08-03 DIAGNOSIS — I4891 Unspecified atrial fibrillation: Secondary | ICD-10-CM

## 2010-08-03 DIAGNOSIS — I10 Essential (primary) hypertension: Secondary | ICD-10-CM

## 2010-08-03 DIAGNOSIS — I495 Sick sinus syndrome: Secondary | ICD-10-CM

## 2010-08-03 NOTE — Assessment & Plan Note (Signed)
The patient has been feeling well on her current blood pressure regimen.  She has been exercising on a regular basis.  She goes to the [employer] workout 3 times a week.  She does yoga twice a week she also walks in the swimming pool for exercise on regular basis.  She has not been expressing any chest pain or angina.  She notes occasional shortness of breath when climbing steps

## 2010-08-03 NOTE — Assessment & Plan Note (Signed)
The patient has a history of tachybradycardia syndrome.  She has a functioning dual-chamber pacemaker followed by Dr. Johney Frame.  Since last visit she has not been experiencing any significant palpitations or tachycardia he notes occasional brief extrasystoles.

## 2010-08-03 NOTE — Progress Notes (Signed)
Kathryn Gibson Date of Birth:  Oct 17, 1933 [facility] / [facility] [address]   [address] [place], [place]  [address] [phone]           Fax   [phone]  History of Present Illness: This pleasant 77 year old woman is seen for a scheduled followup visit.  She has become a widow over the past several months.  She seems to make any adjustment okay.  The friends at [facility] have been very kind to her.  She is also staying very busy exercising and keeping her up with physical fitness.  She's not expressing any chest pain or shortness of breath.  She does not have any history of known ischemic heart disease.  She had a normal nuclear stress test on 09/29/05.  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  Current Outpatient Prescriptions  Medication Sig Dispense Refill  . amiodarone (PACERONE) 200 MG tablet Take 200 mg by mouth daily. A half tablet daily po        . Ascorbic Acid (VITAMIN C) 1000 MG tablet Take 1,000 mg by mouth daily.        Marland Kitchen aspirin 81 MG tablet Take 81 mg by mouth daily.        . Bromfenac Sodium (BROMDAY OP) Apply to eye.        . calcium carbonate 200 MG capsule Take 250 mg by mouth daily.        . cholecalciferol (VITAMIN D) 1000 UNITS tablet Take 1,000 Units by mouth daily.        . fexofenadine (ALLEGRA) 180 MG tablet Take 180 mg by mouth as needed.        . fish oil-omega-3 fatty acids 1000 MG capsule Take 1 g by mouth.        . levothyroxine (SYNTHROID, LEVOTHROID) 50 MCG tablet Take 50 mcg by mouth daily. po and 75 mcg po alternate days       . metoprolol succinate (TOPROL-XL) 25 MG 24 hr tablet Take 25 mg by mouth daily.        . Multiple Vitamin (MULTIVITAMIN) capsule Take 1 capsule by mouth daily.         . Probiotic Product (ALIGN PO) Take by mouth as needed.       . verapamil (CALAN-SR) 240 MG CR tablet Take 240 mg by mouth at bedtime.        Marland Kitchen warfarin (COUMADIN) 5 MG tablet TAKE 1 TABLET (5 MG) FOR 5 DAYS AND TAKE ONE-HALF (1/2) TABLET (2.5MG ) FOR 2 DAYS OR AS DIRECTED  90 tablet  2  . zolpidem (AMBIEN) 10 MG tablet Take 10 mg by mouth at bedtime as needed.        Marland Kitchen DISCONTD: CycloSPORINE (RESTASIS OP) Apply to eye.          No Known Allergies  Patient Active Problem List  Diagnoses  . UNSPECIFIED DISORDER OF THYROID  . COAGULOPATHY  . BRADYCARDIA-TACHYCARDIA SYNDROME  . DIVERTICULOSIS, COLON  . CONSTIPATION  . CONSTIPATION, CHRONIC  . IRRITABLE BOWEL SYNDROME  . ABDOMINAL BLOATING  . CHANGE IN BOWELS  . COLONIC POLYPS, ADENOMATOUS, HX OF  . HYPOTHYROIDISM  . Essential hypertension, benign  . PALPITATIONS  . Atrial fibrillation    History  Smoking status  . Former Smoker  . Types:  Cigarettes  . Quit date: 02/09/1980  Smokeless tobacco  . Not on file    History  Alcohol Use  . 1.2 oz/week  . 2 Glasses of wine per week    No family history on file.  Review of Systems: Constitutional: no fever chills diaphoresis or fatigue or change in weight.  Head and neck: no hearing loss, no epistaxis, no photophobia or visual disturbance. Respiratory: No cough, shortness of breath or wheezing. Cardiovascular: No chest pain peripheral edema, palpitations. Gastrointestinal: No abdominal distention, no abdominal pain, no change in bowel habits hematochezia or melena. Genitourinary: No dysuria, no frequency, no urgency, no nocturia. Musculoskeletal:No arthralgias, no back pain, no gait disturbance or myalgias. Neurological: No dizziness, no headaches, no numbness, no seizures, no syncope, no weakness, no tremors. Hematologic: No lymphadenopathy, no easy bruising. Psychiatric: No confusion, no hallucinations, no sleep disturbance.    Physical Exam: Filed Vitals:    08/03/10 1447  BP: 134/78  Pulse: 70   The general appearance feels a well-developed well-nourished woman in no distress.The head and neck exam reveals pupils equal and reactive.  Extraocular movements are full.  There is no scleral icterus.  The mouth and pharynx are normal.  The neck is supple.  The carotids reveal no bruits.  The jugular venous pressure is normal.  The  thyroid is not enlarged.  There is no lymphadenopathy.  The chest is clear to percussion and auscultation.  There are no rales or rhonchi.  Expansion of the chest is symmetrical.  The precordium is quiet.  The first heart sound is normal.  The second heart sound is physiologically split.  There is no murmur gallop rub or click.  There is no abnormal lift or heave.  The abdomen is soft and nontender.  The bowel sounds are normal.  The liver and spleen are not enlarged.  There are no abdominal masses.  There are no abdominal bruits.  Extremities reveal good pedal pulses.  There is no phlebitis or edema.  There is no cyanosis or clubbing.  Strength is normal and symmetrical in all extremities.  There is no lateralizing weakness.  There are no sensory deficits.  The skin is warm and dry.  There is no rash.  Assessment / Plan: Continue present medication.  Her INR today is therapeutic at 2.3 white count at monthly intervals for protimes and we will see her in 4 months for a followup office visit

## 2010-08-14 ENCOUNTER — Encounter: Payer: Self-pay | Admitting: *Deleted

## 2010-08-17 ENCOUNTER — Ambulatory Visit: Payer: Medicare Other | Admitting: Cardiology

## 2010-09-01 ENCOUNTER — Encounter: Payer: Medicare Other | Admitting: *Deleted

## 2010-09-02 ENCOUNTER — Ambulatory Visit (INDEPENDENT_AMBULATORY_CARE_PROVIDER_SITE_OTHER): Payer: Medicare Other | Admitting: *Deleted

## 2010-09-02 DIAGNOSIS — I4891 Unspecified atrial fibrillation: Secondary | ICD-10-CM

## 2010-09-02 LAB — POCT INR: INR: 2.9

## 2010-09-30 ENCOUNTER — Ambulatory Visit (INDEPENDENT_AMBULATORY_CARE_PROVIDER_SITE_OTHER): Payer: Medicare Other | Admitting: *Deleted

## 2010-09-30 DIAGNOSIS — I4891 Unspecified atrial fibrillation: Secondary | ICD-10-CM

## 2010-09-30 LAB — POCT INR: INR: 2.7

## 2010-10-26 ENCOUNTER — Other Ambulatory Visit: Payer: Self-pay | Admitting: Dermatology

## 2010-10-28 ENCOUNTER — Ambulatory Visit (INDEPENDENT_AMBULATORY_CARE_PROVIDER_SITE_OTHER): Payer: Medicare Other | Admitting: *Deleted

## 2010-10-28 DIAGNOSIS — I4891 Unspecified atrial fibrillation: Secondary | ICD-10-CM

## 2010-10-28 LAB — POCT INR: INR: 2.5

## 2010-10-29 ENCOUNTER — Other Ambulatory Visit: Payer: Self-pay

## 2010-10-29 ENCOUNTER — Encounter: Payer: Self-pay | Admitting: Internal Medicine

## 2010-10-29 ENCOUNTER — Ambulatory Visit (INDEPENDENT_AMBULATORY_CARE_PROVIDER_SITE_OTHER): Payer: Medicare Other | Admitting: *Deleted

## 2010-10-29 DIAGNOSIS — I472 Ventricular tachycardia: Secondary | ICD-10-CM

## 2010-10-31 LAB — REMOTE PACEMAKER DEVICE
AL AMPLITUDE: 1.502 mv
BATTERY VOLTAGE: 3 V
RV LEAD AMPLITUDE: 8.7027 mv
RV LEAD IMPEDENCE PM: 552 Ohm

## 2010-11-06 NOTE — Progress Notes (Signed)
Pacer checked by remote 

## 2010-11-09 ENCOUNTER — Encounter: Payer: Self-pay | Admitting: *Deleted

## 2010-11-24 ENCOUNTER — Ambulatory Visit (INDEPENDENT_AMBULATORY_CARE_PROVIDER_SITE_OTHER): Payer: Medicare Other | Admitting: *Deleted

## 2010-11-24 ENCOUNTER — Ambulatory Visit (INDEPENDENT_AMBULATORY_CARE_PROVIDER_SITE_OTHER): Payer: Medicare Other | Admitting: Cardiology

## 2010-11-24 ENCOUNTER — Encounter: Payer: Self-pay | Admitting: Cardiology

## 2010-11-24 ENCOUNTER — Encounter: Payer: Medicare Other | Admitting: *Deleted

## 2010-11-24 DIAGNOSIS — R0609 Other forms of dyspnea: Secondary | ICD-10-CM

## 2010-11-24 DIAGNOSIS — R0600 Dyspnea, unspecified: Secondary | ICD-10-CM

## 2010-11-24 DIAGNOSIS — I10 Essential (primary) hypertension: Secondary | ICD-10-CM

## 2010-11-24 DIAGNOSIS — R0989 Other specified symptoms and signs involving the circulatory and respiratory systems: Secondary | ICD-10-CM

## 2010-11-24 DIAGNOSIS — R002 Palpitations: Secondary | ICD-10-CM

## 2010-11-24 DIAGNOSIS — I4891 Unspecified atrial fibrillation: Secondary | ICD-10-CM

## 2010-11-24 LAB — POCT INR: INR: 2.4

## 2010-11-24 NOTE — Assessment & Plan Note (Signed)
The patient has a history of essential hypertension.  She is on verapamil and metoprolol for her blood pressure.  Blood pressure has been remaining stable.  Is not having any headaches or dizzy spells.

## 2010-11-24 NOTE — Assessment & Plan Note (Signed)
The patient has been experiencing some dyspnea if she walks up an incline.  She has no dyspnea on level ground.  She is able to participate in exercise classes.  Her legs have felt a little weak when she walks up an incline.  He mentioned that her last TSH was high at 9.2, because she had not been taking her Synthroid in the proper way and wondered if the elevated TSH level could be affecting the muscles in her legs.  Now that she is taking the Synthroid properly.  The TSH should return to the normal range and we will observe the possible beneficial effect on her leg symptoms

## 2010-11-24 NOTE — Patient Instructions (Signed)
The current medical regimen is effective;  continue present plan and medications.  Your physician wants you to follow-up in: 4 months You will receive a reminder letter in the mail two months in advance. If you don't receive a letter, please call our office to schedule the follow-up appointment.  

## 2010-11-24 NOTE — Assessment & Plan Note (Signed)
The patient has been having occasional brief palpitations.  The episodes occur at random and are not related to exertion.  The episodes usually last between 2 minutes and 10 minutes.  They stopped spontaneously.

## 2010-11-24 NOTE — Progress Notes (Signed)
Kathryn Gibson Date of Birth:  1933/10/09 [facility] / [facility] [address]   [address] [place], [place]  [address] [phone]           Fax   [phone]  History of Present Illness: This pleasant 77 year old woman is seen for a scheduled followup office visit.  She has a history of tachybradycardia syndrome.  She has a past history of paroxysmal atrial fibrillation.  She is on long-term Coumadin.  She does not have a history of ischemic heart disease.  She had a normal nuclear stress test in August 2007.  She had a pacemaker implanted on 12/17/08 by Dr. Deborah Chalk for tachybradycardia syndrome.  Her most recent echocardiogram on 03/12/09 showed normal left ventricular systolic function and normal diastolic function with mild aortic sclerosis and mild aortic insufficiency and normal pulmonary artery pressure.  Current Outpatient Prescriptions  Medication Sig Dispense Refill  . Ascorbic Acid (VITAMIN C) 1000 MG tablet Take 1,000 mg by mouth daily.        Marland Kitchen aspirin 81 MG tablet Take 81 mg by mouth daily.        . Bromfenac Sodium (BROMDAY OP) Apply to eye.        . calcium carbonate 200 MG capsule Take 600 mg by mouth daily.       . cholecalciferol (VITAMIN D) 1000 UNITS tablet Take 1,000 Units by mouth daily.        . fexofenadine (ALLEGRA) 180 MG tablet Take 180 mg by mouth as needed.        . fish oil-omega-3 fatty acids 1000 MG capsule Take 1 g by mouth.        . levothyroxine (SYNTHROID, LEVOTHROID) 50 MCG tablet Take 50 mcg by mouth daily. po and 75 mcg po alternate days       . metoprolol succinate (TOPROL-XL) 25 MG 24 hr tablet Take 25 mg by mouth daily.        . Multiple Vitamin (MULTIVITAMIN) capsule Take 1 capsule by mouth daily.        . Probiotic Product (ALIGN PO) Take by mouth as needed.       . verapamil (CALAN-SR) 240 MG CR tablet Take 240 mg by mouth at bedtime.        Marland Kitchen warfarin (COUMADIN) 5 MG tablet TAKE 1 TABLET (5 MG) FOR 5 DAYS AND TAKE  ONE-HALF (1/2) TABLET (2.5MG ) FOR 2 DAYS OR AS DIRECTED  90 tablet  2  . zolpidem (AMBIEN) 10 MG tablet Take 10 mg by mouth at bedtime as needed.        Marland Kitchen amiodarone (PACERONE) 200 MG tablet Take 200 mg by mouth daily. A half tablet daily po          No Known Allergies  Patient Active Problem List  Diagnoses  . UNSPECIFIED DISORDER OF THYROID  . COAGULOPATHY  . BRADYCARDIA-TACHYCARDIA SYNDROME  . DIVERTICULOSIS, COLON  . CONSTIPATION  . CONSTIPATION, CHRONIC  . IRRITABLE BOWEL SYNDROME  . ABDOMINAL BLOATING  . CHANGE IN BOWELS  . COLONIC POLYPS, ADENOMATOUS, HX OF  . HYPOTHYROIDISM  . Essential hypertension, benign  . PALPITATIONS  . Atrial fibrillation    History  Smoking status  . Former Smoker  . Types: Cigarettes  . Quit date: 02/09/1980  Smokeless tobacco  . Not on file    History  Alcohol Use  . 1.2 oz/week  . 2 Glasses of wine per week    Family History  Problem Relation Age  of Onset  . Heart failure Mother   . Heart attack Father     Review of Systems: Constitutional: no fever chills diaphoresis or fatigue or change in weight.  Head and neck: no hearing loss, no epistaxis, no photophobia or visual disturbance. Respiratory: No cough, shortness of breath or wheezing. Cardiovascular: No chest pain peripheral edema, palpitations. Gastrointestinal: No abdominal distention, no abdominal pain, no change in bowel habits hematochezia or melena. Genitourinary: No dysuria, no frequency, no urgency, no nocturia. Musculoskeletal:No arthralgias, no back pain, no gait disturbance or myalgias. Neurological: No dizziness, no headaches, no numbness, no seizures, no syncope, no weakness, no tremors. Hematologic: No lymphadenopathy, no easy bruising. Psychiatric: No confusion, no hallucinations, no sleep disturbance.    Physical Exam: Filed Vitals:   11/24/10 1401  BP: 121/62  Pulse: 60   Gen. appearance reveals a well-developed, well-nourished woman in no  distress.Pupils equal and reactive.   Extraocular Movements are full.  There is no scleral icterus.  The mouth and pharynx are normal.  The neck is supple.  The carotids reveal no bruits.  The jugular venous pressure is normal.  The thyroid is not enlarged.  There is no lymphadenopathy.  The chest is clear to percussion and auscultation. There are no rales or rhonchi. Expansion of the chest is symmetrical.  The precordium is quiet.  The first heart sound is normal.  The second heart sound is physiologically split.  There is no murmur gallop rub or click.  There is no abnormal lift or heave.  The precordium is quiet.  The first heart sound is normal.  The second heart sound is physiologically split.  There is no murmur gallop rub or click.  There is no abnormal lift or heave.  The pedal pulses are good.  There is no phlebitis or edema.  There is no cyanosis or clubbing. Strength is normal and symmetrical in all extremities.  There is no lateralizing weakness.  There are no sensory deficits.  The skin is warm and dry.  There is no rash.   Assessment / Plan:  the patient is to continue her same cardiac medications.  She reported that she had a recent complete physical with Dr. Felipa Eth and that everything checked out well   recheck here in 4 months for followup office visit.  Electrocardiogram today shows atrial paced rhythm at 60 and nonspecific T-wave flattening

## 2010-12-07 ENCOUNTER — Ambulatory Visit: Payer: Medicare Other | Admitting: Cardiology

## 2010-12-10 NOTE — Progress Notes (Signed)
Addended by: Andrey Cota A on: 12/10/2010 04:28 PM   Modules accepted: Orders

## 2010-12-24 ENCOUNTER — Other Ambulatory Visit: Payer: Self-pay | Admitting: Cardiology

## 2010-12-24 NOTE — Telephone Encounter (Signed)
Refilled amiodarone 

## 2011-01-05 ENCOUNTER — Ambulatory Visit (INDEPENDENT_AMBULATORY_CARE_PROVIDER_SITE_OTHER): Payer: Medicare Other | Admitting: *Deleted

## 2011-01-05 DIAGNOSIS — I4891 Unspecified atrial fibrillation: Secondary | ICD-10-CM

## 2011-01-05 LAB — POCT INR: INR: 2.7

## 2011-01-07 ENCOUNTER — Telehealth: Payer: Self-pay | Admitting: Cardiology

## 2011-01-07 NOTE — Telephone Encounter (Signed)
Left message for patient that she can call to verify if we received her transmission before going out of town but the final from the MD may not be done by the time she goes out of town on the 18th.

## 2011-01-07 NOTE — Telephone Encounter (Signed)
New message: due to have check on 12-13 but will be going out of town on the 18 and would like to get report before leaving out of town.  Please call and let her know if this can be done.  [phone]  Leave message if no answer.

## 2011-01-21 ENCOUNTER — Encounter: Payer: Medicare Other | Admitting: *Deleted

## 2011-01-21 ENCOUNTER — Other Ambulatory Visit: Payer: Self-pay | Admitting: Internal Medicine

## 2011-01-21 ENCOUNTER — Encounter: Payer: Self-pay | Admitting: Internal Medicine

## 2011-01-21 ENCOUNTER — Ambulatory Visit (INDEPENDENT_AMBULATORY_CARE_PROVIDER_SITE_OTHER): Payer: Medicare Other | Admitting: *Deleted

## 2011-01-21 ENCOUNTER — Telehealth: Payer: Self-pay | Admitting: Cardiology

## 2011-01-21 DIAGNOSIS — I495 Sick sinus syndrome: Secondary | ICD-10-CM

## 2011-01-21 NOTE — Telephone Encounter (Signed)
New Msg: Pt calling wanting to make sure remote device check was received. Please return pt call to discuss further.

## 2011-01-21 NOTE — Telephone Encounter (Signed)
Spoke with pt to inform both transmissions that were sent was received. Pt is going out of town 01-26-11 for 3 weeks. Informed pt letter would be sent with next transmission date or OV.

## 2011-01-24 LAB — REMOTE PACEMAKER DEVICE
AL AMPLITUDE: 1.5 mv
AL IMPEDENCE PM: 560 Ohm
BAMS-0001: 170 {beats}/min
BATTERY VOLTAGE: 3 V
VENTRICULAR PACING PM: 0.07

## 2011-01-25 LAB — REMOTE PACEMAKER DEVICE
AL AMPLITUDE: 1.5 mv
AL IMPEDENCE PM: 560 Ohm
BAMS-0001: 170 {beats}/min
BRDY-0003RA: 120 {beats}/min
RV LEAD IMPEDENCE PM: 568 Ohm
VENTRICULAR PACING PM: 0.07

## 2011-01-26 NOTE — Progress Notes (Signed)
PPM remote 

## 2011-01-28 ENCOUNTER — Encounter: Payer: Medicare Other | Admitting: *Deleted

## 2011-02-04 ENCOUNTER — Encounter: Payer: Self-pay | Admitting: *Deleted

## 2011-02-17 ENCOUNTER — Other Ambulatory Visit: Payer: Self-pay | Admitting: *Deleted

## 2011-02-17 ENCOUNTER — Encounter: Payer: Self-pay | Admitting: Internal Medicine

## 2011-02-17 ENCOUNTER — Other Ambulatory Visit: Payer: Self-pay | Admitting: Internal Medicine

## 2011-02-17 ENCOUNTER — Ambulatory Visit (INDEPENDENT_AMBULATORY_CARE_PROVIDER_SITE_OTHER): Payer: Medicare Other | Admitting: *Deleted

## 2011-02-17 DIAGNOSIS — I4891 Unspecified atrial fibrillation: Secondary | ICD-10-CM

## 2011-02-17 MED ORDER — VERAPAMIL HCL 240 MG PO TBCR: 240.0000 mg | EXTENDED_RELEASE_TABLET | Freq: Every day | ORAL | Status: DC

## 2011-02-17 NOTE — Telephone Encounter (Signed)
Refilled verapamil.

## 2011-02-18 LAB — PACEMAKER DEVICE OBSERVATION
ATRIAL PACING PM: 96.56
BAMS-0001: 170 {beats}/min
RV LEAD IMPEDENCE PM: 552 Ohm

## 2011-02-26 ENCOUNTER — Other Ambulatory Visit: Payer: Self-pay | Admitting: Cardiology

## 2011-02-26 MED ORDER — VERAPAMIL HCL 240 MG PO TBCR: 240.0000 mg | EXTENDED_RELEASE_TABLET | Freq: Every day | ORAL | Status: DC

## 2011-03-01 ENCOUNTER — Other Ambulatory Visit: Payer: Self-pay | Admitting: *Deleted

## 2011-03-01 MED ORDER — METOPROLOL SUCCINATE ER 50 MG PO TB24: 50.0000 mg | ORAL_TABLET | Freq: Every day | ORAL | Status: DC

## 2011-03-01 NOTE — Telephone Encounter (Signed)
Refilled toprol

## 2011-03-04 ENCOUNTER — Other Ambulatory Visit: Payer: Self-pay | Admitting: Obstetrics and Gynecology

## 2011-03-04 DIAGNOSIS — Z1231 Encounter for screening mammogram for malignant neoplasm of breast: Secondary | ICD-10-CM

## 2011-03-08 ENCOUNTER — Other Ambulatory Visit: Payer: Self-pay | Admitting: *Deleted

## 2011-03-25 ENCOUNTER — Ambulatory Visit (INDEPENDENT_AMBULATORY_CARE_PROVIDER_SITE_OTHER): Payer: Medicare Other | Admitting: Cardiology

## 2011-03-25 ENCOUNTER — Encounter: Payer: Self-pay | Admitting: Cardiology

## 2011-03-25 ENCOUNTER — Ambulatory Visit (INDEPENDENT_AMBULATORY_CARE_PROVIDER_SITE_OTHER): Payer: Medicare Other

## 2011-03-25 DIAGNOSIS — I359 Nonrheumatic aortic valve disorder, unspecified: Secondary | ICD-10-CM

## 2011-03-25 DIAGNOSIS — I119 Hypertensive heart disease without heart failure: Secondary | ICD-10-CM

## 2011-03-25 DIAGNOSIS — I4891 Unspecified atrial fibrillation: Secondary | ICD-10-CM

## 2011-03-25 DIAGNOSIS — I10 Essential (primary) hypertension: Secondary | ICD-10-CM

## 2011-03-25 DIAGNOSIS — R002 Palpitations: Secondary | ICD-10-CM

## 2011-03-25 DIAGNOSIS — I351 Nonrheumatic aortic (valve) insufficiency: Secondary | ICD-10-CM | POA: Insufficient documentation

## 2011-03-25 LAB — POCT INR: INR: 2

## 2011-03-25 NOTE — Assessment & Plan Note (Signed)
The patient has not been having any headaches or dizzy spells.  Her blood pressure has been remaining stable on her current regimen.  She takes her beta blocker in the morning and her calcium blocker after lunch.

## 2011-03-25 NOTE — Progress Notes (Signed)
Kathryn Gibson Date of Birth:  1933-11-24 [facility] [address] [place], [place]  [address] [phone]         Fax   [phone]  History of Present Illness: This pleasant 78 year old woman is seen for a scheduled followup office visit.  She has a history of tachybradycardia syndrome and has a functioning pacemaker.  She is due to see Dr. Johney Frame next month.  Her pacemaker was implanted on 12/17/08 at Dr. Deborah Chalk is a history of aortic valve disease with mild aortic sclerosis and aortic insufficiency.  She does not have any history of ischemic heart disease and she had a normal nuclear stress test in August 2007.  Patient is on long-term Coumadin anticoagulation for her history of paroxysmal atrial fibrillation.  His last visit she's had several brief episodes of atrial fib but no sustained episodes.  Current Outpatient Prescriptions  Medication Sig Dispense Refill  . amiodarone (PACERONE) 200 MG tablet TAKE ONE-HALF (1/2) TABLET DAILY  45 tablet  3  . Ascorbic Acid (VITAMIN C) 1000 MG tablet Take 1,000 mg by mouth daily.        Marland Kitchen aspirin 81 MG tablet Take 81 mg by mouth daily.        . Bromfenac Sodium (BROMDAY OP) Apply to eye.        . calcium carbonate 200 MG capsule Take 600 mg by mouth daily.       . cholecalciferol (VITAMIN D) 1000 UNITS tablet Take 1,000 Units by mouth daily.        . fexofenadine (ALLEGRA) 180 MG tablet Take 180 mg by mouth as needed.        . fish oil-omega-3 fatty acids 1000 MG capsule Take 1 g by mouth.        Marland Kitchen GLUCOSAMINE-CHONDROITIN-MSM-D PO Take by mouth 2 (two) times daily.      Marland Kitchen levothyroxine (SYNTHROID, LEVOTHROID) 50 MCG tablet Take 50 mcg by mouth daily. po and 75 mcg po alternate days       . metoprolol succinate (TOPROL-XL) 50 MG 24 hr tablet Take 1 tablet (50 mg total) by mouth daily. Now taking 50mg  daily  90 tablet  3  . Multiple Vitamin (MULTIVITAMIN) capsule Take 1 capsule by mouth daily.        . Probiotic  Product (ALIGN PO) Take by mouth as needed.       . verapamil (CALAN-SR) 240 MG CR tablet Take 1 tablet (240 mg total) by mouth at bedtime.  90 tablet  3  . warfarin (COUMADIN) 5 MG tablet TAKE 1 TABLET (5 MG) FOR 5 DAYS AND TAKE ONE-HALF (1/2) TABLET (2.5MG ) FOR 2 DAYS OR AS DIRECTED  90 tablet  2  . zolpidem (AMBIEN) 10 MG tablet Take 10 mg by mouth at bedtime as needed.          No Known Allergies  Patient Active Problem List  Diagnoses  . UNSPECIFIED DISORDER OF THYROID  . COAGULOPATHY  . BRADYCARDIA-TACHYCARDIA SYNDROME  . DIVERTICULOSIS, COLON  . CONSTIPATION  . CONSTIPATION, CHRONIC  . IRRITABLE BOWEL SYNDROME  . ABDOMINAL BLOATING  . CHANGE IN BOWELS  . COLONIC POLYPS, ADENOMATOUS, HX OF  . HYPOTHYROIDISM  . Essential hypertension, benign  . PALPITATIONS  . Atrial fibrillation  . Dyspnea  . Aortic valve insufficiency    History  Smoking status  . Former Smoker  . Types: Cigarettes  . Quit date: 02/09/1980  Smokeless tobacco  . Not on file  History  Alcohol Use  . 1.2 oz/week  . 2 Glasses of wine per week    Family History  Problem Relation Age of Onset  . Heart failure Mother   . Heart attack Father     Review of Systems: Constitutional: no fever chills diaphoresis or fatigue or change in weight.  Head and neck: no hearing loss, no epistaxis, no photophobia or visual disturbance. Respiratory: No cough, shortness of breath or wheezing. Cardiovascular: No chest pain peripheral edema, palpitations. Gastrointestinal: No abdominal distention, no abdominal pain, no change in bowel habits hematochezia or melena. Genitourinary: No dysuria, no frequency, no urgency, no nocturia. Musculoskeletal:No arthralgias, no back pain, no gait disturbance or myalgias. Neurological: No dizziness, no headaches, no numbness, no seizures, no syncope, no weakness, no tremors. Hematologic: No lymphadenopathy, no easy bruising. Psychiatric: No confusion, no hallucinations, no  sleep disturbance.    Physical Exam: Filed Vitals:   03/25/11 1401  BP: 126/72  Pulse: 62   the general appearance reveals a well-developed well-nourished woman in no distress.The head and neck exam reveals pupils equal and reactive.  Extraocular movements are full.  There is no scleral icterus.  The mouth and pharynx are normal.  The neck is supple.  The carotids reveal no bruits.  The jugular venous pressure is normal.  The  thyroid is not enlarged.  There is no lymphadenopathy.  The chest is clear to percussion and auscultation.  There are no rales or rhonchi.  Expansion of the chest is symmetrical.  The precordium is quiet.  The first heart sound is normal.  The second heart sound is physiologically split.  There is no murmur gallop rub or click.  There is no abnormal lift or heave.  The abdomen is soft and nontender.  The bowel sounds are normal.  The liver and spleen are not enlarged.  There are no abdominal masses.  There are no abdominal bruits.  Extremities reveal good pedal pulses.  There is no phlebitis or edema.  There is no cyanosis or clubbing.  Strength is normal and symmetrical in all extremities.  There is no lateralizing weakness.  There are no sensory deficits.  The skin is warm and dry.  There is no rash.     Assessment / Plan: Continue same medication.  She will return soon for a two-dimensional echocardiogram to followup on her aortic valve disease.  Recheck in 4 months and after that plan to update her chest x-ray if she has not had one elsewhere in the interim.

## 2011-03-25 NOTE — Assessment & Plan Note (Signed)
The patient has not been having any symptoms of congestive heart failure.  She's had no paroxysmal nocturnal dyspnea.  She does have exertional dyspnea climbing stairs.  We will update her two-dimensional echocardiogram soon.

## 2011-03-25 NOTE — Assessment & Plan Note (Addendum)
The patient remains in normal sinus rhythm on low dose amiodarone 100 mg daily.  She's not having any side effects from the amiodarone.  Her last chest x-ray was in November 2010 and after her next office visit we should probably update her chest x-ray.

## 2011-03-25 NOTE — Patient Instructions (Signed)
Your physician recommends that you continue on your current medications as directed. Please refer to the Current Medication list given to you today. Your physician recommends that you schedule a follow-up appointment in: 4 months  Echocardiography Your caregiver has requested that you have an echocardiogram. This is a test which produces images of the heart by using sound waves. The echocardiogram is simple, painless, obtained within a short period of time and offers valuable information to your caregiver. The images from an echocardiogram can provide cardiac information such as:  Heart size.   Heart muscle function.   Heart valve function.   Aneurysm detection.   Evidence of a past heart attack.   Fluid buildup around the heart.   Heart muscle thickening.   Assess heart valve function after heart valve surgery.  BEFORE THE PROCEDURE  There may be paperwork for you to fill out prior to the echocardiogram. You should arrive 60 minutes prior to your procedure or as directed.  PROCEDURE   No special preparation is needed. Dress and eat normally.   The echocardiogram test normally takes less than an hour to do.   You will need to remove the clothes from your waist up.   A small hand held device about the size of a microphone is lubricated with gel and placed at various areas on your chest. The gel helps transmit the sound waves from the hand held device. The sound waves harmlessly bounce off your heart to allow the heart images to be captured in "real time" motion. These images are then recorded on video tape and stored on a computer so your cardiologist (specialized heart doctor) can interpret the echocardiogram.  RESULTS Echocardiogram results are usually available the same day you had your test. Ask your caregiver how to obtain your echocardiogram results. It is your responsibility to follow up on obtaining the results. Document Released: 01/23/2000 Document Revised: 10/07/2010  Document Reviewed: 09/05/2007 [facility] Patient Information 2012 [place], [place].

## 2011-04-01 ENCOUNTER — Ambulatory Visit
Admission: RE | Admit: 2011-04-01 | Discharge: 2011-04-01 | Disposition: A | Discharge status: Home / Self Care | Payer: Medicare Other | Source: Ambulatory Visit | Attending: Obstetrics and Gynecology | Admitting: Obstetrics and Gynecology

## 2011-04-01 DIAGNOSIS — Z1231 Encounter for screening mammogram for malignant neoplasm of breast: Secondary | ICD-10-CM

## 2011-04-09 ENCOUNTER — Ambulatory Visit (HOSPITAL_COMMUNITY): Payer: Medicare Other | Attending: Cardiology

## 2011-04-09 ENCOUNTER — Other Ambulatory Visit: Payer: Self-pay

## 2011-04-09 DIAGNOSIS — I351 Nonrheumatic aortic (valve) insufficiency: Secondary | ICD-10-CM

## 2011-04-09 DIAGNOSIS — R0989 Other specified symptoms and signs involving the circulatory and respiratory systems: Secondary | ICD-10-CM | POA: Insufficient documentation

## 2011-04-09 DIAGNOSIS — R0609 Other forms of dyspnea: Secondary | ICD-10-CM | POA: Insufficient documentation

## 2011-04-09 DIAGNOSIS — I359 Nonrheumatic aortic valve disorder, unspecified: Secondary | ICD-10-CM

## 2011-04-09 DIAGNOSIS — I4891 Unspecified atrial fibrillation: Secondary | ICD-10-CM | POA: Insufficient documentation

## 2011-04-16 ENCOUNTER — Telehealth: Payer: Self-pay | Admitting: Cardiology

## 2011-04-16 ENCOUNTER — Telehealth: Payer: Self-pay | Admitting: *Deleted

## 2011-04-16 NOTE — Telephone Encounter (Signed)
Advised of echo  

## 2011-04-16 NOTE — Telephone Encounter (Signed)
Message copied by Burnell Blanks on Fri Apr 16, 2011  5:11 PM ------      Message from: Cassell Clement      Created: Sun Apr 11, 2011  9:13 PM       Please report.  LV systolic function is normal.  There is diastolic dysfunction which can sometimes cause dyspnea.  The Aortic and mitral valves show only mild regurgitation.  Stay on same meds.

## 2011-04-16 NOTE — Telephone Encounter (Signed)
Fu call Pt was calling about verapamil sr or verapamil er she wants to discuss with you

## 2011-04-16 NOTE — Telephone Encounter (Signed)
Please return call to patient  Concerning medication, she can be reached at hm# [phone]  after 4pm today.

## 2011-04-16 NOTE — Telephone Encounter (Signed)
Message copied by Burnell Blanks on Fri Apr 16, 2011  5:12 PM ------      Message from: Cassell Clement      Created: Sun Apr 11, 2011  9:13 PM       Please report.  LV systolic function is normal.  There is diastolic dysfunction which can sometimes cause dyspnea.  The Aortic and mitral valves show only mild regurgitation.  Stay on same meds.

## 2011-04-16 NOTE — Telephone Encounter (Signed)
Advised ok to take 

## 2011-04-26 ENCOUNTER — Ambulatory Visit (INDEPENDENT_AMBULATORY_CARE_PROVIDER_SITE_OTHER): Payer: Medicare Other | Admitting: Pharmacist

## 2011-04-26 ENCOUNTER — Encounter: Payer: Self-pay | Admitting: Internal Medicine

## 2011-04-26 ENCOUNTER — Ambulatory Visit (INDEPENDENT_AMBULATORY_CARE_PROVIDER_SITE_OTHER): Payer: Medicare Other | Admitting: Internal Medicine

## 2011-04-26 VITALS — BP 128/67 | HR 65 | Resp 18 | Ht 61.0 in | Wt 119.8 lb

## 2011-04-26 DIAGNOSIS — I495 Sick sinus syndrome: Secondary | ICD-10-CM

## 2011-04-26 DIAGNOSIS — I4891 Unspecified atrial fibrillation: Secondary | ICD-10-CM

## 2011-04-26 LAB — PACEMAKER DEVICE OBSERVATION
AL IMPEDENCE PM: 568 Ohm
AL THRESHOLD: 1 V
ATRIAL PACING PM: 99
BAMS-0001: 170 {beats}/min
BATTERY VOLTAGE: 3 V
RV LEAD IMPEDENCE PM: 560 Ohm
RV LEAD THRESHOLD: 1 V

## 2011-04-26 LAB — POCT INR: INR: 2.1

## 2011-04-26 NOTE — Assessment & Plan Note (Signed)
No afib since last interrogation Continue coumadin I have recommended that she stop ASA.  She wishes to discuss this with Dr Patty Sermons first

## 2011-04-26 NOTE — Progress Notes (Signed)
PCP:  Hoyle Sauer, MD, MD Primary Cardiologist:  Dr Patty Sermons  The patient presents today for routine electrophysiology followup.  Since last being seen in our clinic, the patient reports doing very well.  She has a h/o atrial fibrillation and tachycardia/bradycardia syndrome s/p PPM (MDT) by Dr Deborah Chalk  12/17/08.  She remains active despite his age.  She lives at [place] and exercises there frequently.  She reports rare palpitations.  She denies chest pain, shortness of breath, orthopnea, PND, lower extremity edema, dizziness, presyncope, syncope, or neurologic sequela. The patient is tolerating medications without difficulties and is otherwise without complaint today.    Past Medical History  Diagnosis Date  . Atrial fibrillation   . Hypertension   . Hypothyroidism   . Irritable bowel syndrome (IBS)   . Constipation   . Colonic polyp   . Diverticulosis   . Bradycardia     s/p PPM   Past Surgical History  Procedure Date  . Pacemaker insertion 2010    implanted by Dr Deborah Chalk (MDT)  . Colonoscopy     Current Outpatient Prescriptions  Medication Sig Dispense Refill  . amiodarone (PACERONE) 200 MG tablet TAKE ONE-HALF (1/2) TABLET DAILY  45 tablet  3  . Ascorbic Acid (VITAMIN C) 1000 MG tablet Take 1,000 mg by mouth daily.        Marland Kitchen aspirin 81 MG tablet Take 81 mg by mouth daily.        . Bromfenac Sodium (BROMDAY OP) Apply to eye.        . calcium carbonate 200 MG capsule Take 600 mg by mouth daily.       . cholecalciferol (VITAMIN D) 1000 UNITS tablet Take 1,000 Units by mouth daily.        . fexofenadine (ALLEGRA) 180 MG tablet Take 180 mg by mouth as needed.        . fish oil-omega-3 fatty acids 1000 MG capsule Take 1 g by mouth.        Marland Kitchen GLUCOSAMINE-CHONDROITIN-MSM-D PO Take by mouth 2 (two) times daily.      Marland Kitchen levothyroxine (SYNTHROID, LEVOTHROID) 50 MCG tablet Take 50 mcg by mouth daily. po and 75 mcg po alternate days       . metoprolol succinate (TOPROL-XL) 50  MG 24 hr tablet Take 1 tablet (50 mg total) by mouth daily. Now taking 50mg  daily  90 tablet  3  . Multiple Vitamin (MULTIVITAMIN) capsule Take 1 capsule by mouth daily.        . Probiotic Product (ALIGN PO) Take by mouth as needed.       . verapamil (CALAN-SR) 240 MG CR tablet Take 1 tablet (240 mg total) by mouth at bedtime.  90 tablet  3  . warfarin (COUMADIN) 5 MG tablet TAKE 1 TABLET (5 MG) FOR 5 DAYS AND TAKE ONE-HALF (1/2) TABLET (2.5MG ) FOR 2 DAYS OR AS DIRECTED  90 tablet  2  . zolpidem (AMBIEN) 10 MG tablet Take 10 mg by mouth at bedtime as needed.          No Known Allergies  History   Social History  . Marital Status: Married    Spouse Name: N/A    Number of Children: N/A  . Years of Education: N/A   Occupational History  . Not on file.   Social History Main Topics  . Smoking status: Former Smoker    Types: Cigarettes    Quit date: 02/09/1980  . Smokeless tobacco: Not on file  .  Alcohol Use: 1.2 oz/week    2 Glasses of wine per week  . Drug Use: No  . Sexually Active: Not on file   Other Topics Concern  . Not on file   Social History Narrative  . No narrative on file    Family History  Problem Relation Age of Onset  . Heart failure Mother   . Heart attack Father     Physical Exam: Filed Vitals:   04/26/11 1512  BP: 128/67  Pulse: 65  Resp: 18  Height: 5\' 1"  (1.549 m)  Weight: 119 lb 12.8 oz (54.341 kg)    GEN- The patient is well appearing, alert and oriented x 3 today.   Head- normocephalic, atraumatic Eyes-  Sclera clear, conjunctiva pink Ears- hearing intact Oropharynx- clear Neck- supple, no JVP Lymph- no cervical lymphadenopathy Lungs- Clear to ausculation bilaterally, normal work of breathing Chest- pacemaker pocket is well healed Heart- Regular rate and rhythm, no murmurs, rubs or gallops, PMI not laterally displaced GI- soft, NT, ND, + BS Extremities- no clubbing, cyanosis, or edema  Pacemaker interrogation- reviewed in detail  today,  See PACEART report  Assessment and Plan:

## 2011-04-26 NOTE — Patient Instructions (Addendum)
Remote monitoring is used to monitor your Pacemaker of ICD from home. This monitoring reduces the number of office visits required to check your device to one time per year. It allows [place] to keep an eye on the functioning of your device to ensure it is working properly. You are scheduled for a device check from home on July 29, 2011. You may send your transmission at any time that day. If you have a wireless device, the transmission will be sent automatically. After your physician reviews your transmission, you will receive a postcard with your next transmission date.  Your physician wants you to follow-up in: 12 months with Dr Jacquiline Doe will receive a reminder letter in the mail two months in advance. If you don't receive a letter, please call our office to schedule the follow-up appointment.

## 2011-04-26 NOTE — Assessment & Plan Note (Signed)
Normal pacemaker function See Arita Miss Art report  She has a blunted heart rate histogram and is frequently A paced Rate response is adjusted today

## 2011-05-03 ENCOUNTER — Encounter: Payer: Self-pay | Admitting: Internal Medicine

## 2011-05-03 ENCOUNTER — Ambulatory Visit (INDEPENDENT_AMBULATORY_CARE_PROVIDER_SITE_OTHER): Payer: Medicare Other | Admitting: Internal Medicine

## 2011-05-03 VITALS — BP 120/64 | HR 60 | Ht 61.0 in | Wt 119.0 lb

## 2011-05-03 DIAGNOSIS — R143 Flatulence: Secondary | ICD-10-CM

## 2011-05-03 DIAGNOSIS — K5900 Constipation, unspecified: Secondary | ICD-10-CM

## 2011-05-03 DIAGNOSIS — Z86010 Personal history of colonic polyps: Secondary | ICD-10-CM

## 2011-05-03 DIAGNOSIS — R141 Gas pain: Secondary | ICD-10-CM

## 2011-05-03 DIAGNOSIS — D689 Coagulation defect, unspecified: Secondary | ICD-10-CM

## 2011-05-03 DIAGNOSIS — K589 Irritable bowel syndrome without diarrhea: Secondary | ICD-10-CM

## 2011-05-03 MED ORDER — PEG-KCL-NACL-NASULF-NA ASC-C 100 G PO SOLR: 1.0000 | Freq: Once | ORAL | Status: DC

## 2011-05-03 NOTE — Patient Instructions (Signed)
You have been scheduled for a colonoscopy with propofol. Please follow written instructions given to you at your visit today.  Please pick up your prep kit at the pharmacy within the next 1-3 days.  

## 2011-05-03 NOTE — Progress Notes (Signed)
HISTORY OF PRESENT ILLNESS:  Kathryn Gibson is a 78 y.o. female with multiple medical problems including hypertension, hypothyroidism, tachybradycardia syndrome status post pacemaker placement, atrial fibrillation on chronic Coumadin therapy, chronic abdominal complaints consistent with constipation predominant irritable bowel syndrome, and adenomatous colon polyps. She has been seen a number of times for chronic abdominal complaints. She was last seen November of 2010. See that dictation. She presents today with ongoing complaints of bloating, abdominal discomfort, and constipation. She inquires about surveillance colonoscopy. Problems with bloating are worse after meals. In addition to belching, she complains of foul-smelling gas which is embarrassing. She continues with regular gynecology checkups, the state she does not tolerate rectal examinations due to pain. She continues on chronic Coumadin therapy. For constipation she takes MiraLax. She has also been on a probiotic. Her last colonoscopy was performed in June of 2010. Small polyp was removed, but was found to have high-grade dysplasia. Followup in 3 years recommended. She also has internal and external hemorrhoids in addition to diverticulosis. REVIEW OF SYSTEMS:  All non-GI ROS negative except for back pain, arthritis, sinus and allergy trouble  Past Medical History  Diagnosis Date  . Atrial fibrillation   . Hypertension   . Hypothyroidism   . Irritable bowel syndrome (IBS)   . Constipation   . Colonic polyp   . Diverticulosis   . Bradycardia     s/p PPM  . Hemorrhoids     Past Surgical History  Procedure Date  . Pacemaker insertion 2010    implanted by Dr Deborah Chalk (MDT)  . Colonoscopy     Social History ANALUCIA HUSH  reports that she quit smoking about 31 years ago. Her smoking use included Cigarettes. She has never used smokeless tobacco. She reports that she drinks about 1.2 ounces of alcohol per week. She reports that  she does not use illicit drugs.  family history includes Heart attack in her father and Heart failure in her mother.  There is no history of Colon cancer.  No Known Allergies     PHYSICAL EXAMINATION: Vital signs: BP 120/64  Pulse 60  Ht 5\' 1"  (1.549 m)  Wt 119 lb (53.978 kg)  BMI 22.48 kg/m2  Constitutional: generally well-appearing, no acute distress Psychiatric: alert and oriented x3, cooperative Eyes: extraocular movements intact, anicteric, conjunctiva pink Mouth: oral pharynx moist, no lesions Neck: supple no lymphadenopathy Cardiovascular: heart regular rate and rhythm, no murmur Lungs: clear to auscultation bilaterally Abdomen: soft, nontender, nondistended, no obvious ascites, no peritoneal signs, normal bowel sounds, no organomegaly Rectal:deferred until colonoscopy Extremities: no lower extremity edema bilaterally Skin: no lesions on visible extremities Neuro: No focal deficits.   ASSESSMENT:  #1. Constipation predominant irritable bowel syndrome to explain problems with constipation, bloating, and abdominal discomfort #2. Personal history of adenomatous colon polyps. Due for followup surveillance. High-risk given comorbidities and chronic anticoagulation therapy #3. Multiple general medical problems. Stable. #4. Chronic Coumadin therapy   PLAN:  #1. Continue with MiraLax for constipation #2. Anti-gas and flatulence brochure and dietary measures #3. Surveillance colonoscopy.The nature of the procedure, as well as the risks, benefits, and alternatives were carefully and thoroughly reviewed with the patient. Ample time for discussion and questions allowed. The patient understood, was satisfied, and agreed to proceed. Movi prep prescribed. The patient instructed on its use #4. Hold Coumadin therapy 4 days prior to colonoscopy if okay with her cardiologist, Dr. Patty Sermons. We will correspond with him in this regard.Marland Kitchen

## 2011-05-04 ENCOUNTER — Telehealth: Payer: Self-pay

## 2011-05-04 NOTE — Telephone Encounter (Signed)
Advised patient

## 2011-05-04 NOTE — Telephone Encounter (Signed)
Left message at patients home.  Will forward to [employer]

## 2011-05-04 NOTE — Telephone Encounter (Signed)
05/04/2011    RE: Kathryn Gibson DOB: [birth year] MRN: [id number]   Dear Dr. Patty Sermons ,    We have scheduled the above patient for an endoscopic procedure. Our records show that she is on anticoagulation therapy.   Please advise as to how long the patient may come off her therapy of Coumandin prior to the procedure, which is scheduled for 06-21-11.  Please fax back/ or route the completed form to [facility] at [phone].   Sincerely,  Gracy Racer

## 2011-05-04 NOTE — Telephone Encounter (Signed)
Since she has had no recent atrial fibrillation she can hold her Coumadin for 4 days prior to the procedure and it does not need Lovenox bridging

## 2011-05-24 ENCOUNTER — Other Ambulatory Visit: Payer: Self-pay | Admitting: Pharmacist

## 2011-05-24 DIAGNOSIS — I4891 Unspecified atrial fibrillation: Secondary | ICD-10-CM

## 2011-05-24 MED ORDER — WARFARIN SODIUM 5 MG PO TABS: ORAL_TABLET | ORAL | Status: DC

## 2011-06-07 ENCOUNTER — Ambulatory Visit (INDEPENDENT_AMBULATORY_CARE_PROVIDER_SITE_OTHER): Payer: Medicare Other

## 2011-06-07 DIAGNOSIS — I4891 Unspecified atrial fibrillation: Secondary | ICD-10-CM

## 2011-06-21 ENCOUNTER — Ambulatory Visit (AMBULATORY_SURGERY_CENTER): Payer: Medicare Other | Admitting: Internal Medicine

## 2011-06-21 ENCOUNTER — Encounter: Payer: Self-pay | Admitting: Internal Medicine

## 2011-06-21 VITALS — BP 148/70 | HR 61 | Temp 95.7°F | Resp 18 | Ht 61.0 in | Wt 119.0 lb

## 2011-06-21 DIAGNOSIS — K589 Irritable bowel syndrome without diarrhea: Secondary | ICD-10-CM

## 2011-06-21 DIAGNOSIS — D126 Benign neoplasm of colon, unspecified: Secondary | ICD-10-CM

## 2011-06-21 DIAGNOSIS — Z86010 Personal history of colonic polyps: Secondary | ICD-10-CM

## 2011-06-21 DIAGNOSIS — R141 Gas pain: Secondary | ICD-10-CM

## 2011-06-21 DIAGNOSIS — Z1211 Encounter for screening for malignant neoplasm of colon: Secondary | ICD-10-CM

## 2011-06-21 MED ORDER — SODIUM CHLORIDE 0.9 % IV SOLN: 500.0000 mL | INTRAVENOUS | Status: DC

## 2011-06-21 NOTE — Progress Notes (Signed)
Patient did not experience any of the following events: a burn prior to discharge; a fall within the facility; wrong site/side/patient/procedure/implant event; or a hospital transfer or hospital admission upon discharge from the facility. (G8907) Patient did not have preoperative order for IV antibiotic SSI prophylaxis. (G8918)  

## 2011-06-21 NOTE — Patient Instructions (Signed)
Discharge instructions given with verbal understanding. Handouts on polyps,diverticulosis, and hemorrhoids given. Resume previous medications.YOU HAD AN ENDOSCOPIC PROCEDURE TODAY AT THE [facility]: Refer to the procedure report that was given to you for any specific questions about what was found during the examination.  If the procedure report does not answer your questions, please call your gastroenterologist to clarify.  If you requested that your care partner not be given the details of your procedure findings, then the procedure report has been included in a sealed envelope for you to review at your convenience later.  YOU SHOULD EXPECT: Some feelings of bloating in the abdomen. Passage of more gas than usual.  Walking can help get rid of the air that was put into your GI tract during the procedure and reduce the bloating. If you had a lower endoscopy (such as a colonoscopy or flexible sigmoidoscopy) you may notice spotting of blood in your stool or on the toilet paper. If you underwent a bowel prep for your procedure, then you may not have a normal bowel movement for a few days.  DIET: Your first meal following the procedure should be a light meal and then it is ok to progress to your normal diet.  A half-sandwich or bowl of soup is an example of a good first meal.  Heavy or fried foods are harder to digest and may make you feel nauseous or bloated.  Likewise meals heavy in dairy and vegetables can cause extra gas to form and this can also increase the bloating.  Drink plenty of fluids but you should avoid alcoholic beverages for 24 hours.  ACTIVITY: Your care partner should take you home directly after the procedure.  You should plan to take it easy, moving slowly for the rest of the day.  You can resume normal activity the day after the procedure however you should NOT DRIVE or use heavy machinery for 24 hours (because of the sedation medicines used during the test).    SYMPTOMS TO  REPORT IMMEDIATELY: A gastroenterologist can be reached at any hour.  During normal business hours, 8:30 AM to 5:00 PM Monday through Friday, call [phone].  After hours and on weekends, please call the GI answering service at [phone] who will take a message and have the physician on call contact you.   Following lower endoscopy (colonoscopy or flexible sigmoidoscopy):  Excessive amounts of blood in the stool  Significant tenderness or worsening of abdominal pains  Swelling of the abdomen that is new, acute  Fever of 100F or higher FOLLOW UP: If any biopsies were taken you will be contacted by phone or by letter within the next 1-3 weeks.  Call your gastroenterologist if you have not heard about the biopsies in 3 weeks.  Our staff will call the home number listed on your records the next business day following your procedure to check on you and address any questions or concerns that you may have at that time regarding the information given to you following your procedure. This is a courtesy call and so if there is no answer at the home number and we have not heard from you through the emergency physician on call, we will assume that you have returned to your regular daily activities without incident.  SIGNATURES/CONFIDENTIALITY: You and/or your care partner have signed paperwork which will be entered into your electronic medical record.  These signatures attest to the fact that that the information above on your After Visit Summary has   been reviewed and is understood.  Full responsibility of the confidentiality of this discharge information lies with you and/or your care-partner.  

## 2011-06-21 NOTE — Op Note (Signed)
[facility] [address] [place], [place]  [address]  COLONOSCOPY PROCEDURE REPORT  PATIENT:  Kathryn Gibson, Kathryn Gibson  MR#:  [id number] BIRTHDATE:  Oct 16, 1933, 78 yrs. old  GENDER:  female ENDOSCOPIST:  Wilhemina Bonito. Eda Keys, MD REF. BY:  Surveillance Program Recall, PROCEDURE DATE:  06/21/2011 PROCEDURE:  Colonoscopy with snare polypectomy x 3 ASA CLASS:  Class III INDICATIONS:  history of pre-cancerous (adenomatous) colon polyps, surveillance and high-risk screening ; multiple previous colonoscoppies elsewhere; last exam here 07-2008 (small polyp with HGD) MEDICATIONS:   MAC sedation, administered by CRNA, propofol (Diprivan) 320 mg IV  DESCRIPTION OF PROCEDURE:   After the risks benefits and alternatives of the procedure were thoroughly explained, informed consent was obtained.  Digital rectal exam was performed and revealed no abnormalities.   The LB CF-[id number] endoscope was introduced through the anus and advanced to the cecum, which was identified by both the appendix and ileocecal valve, without limitations.  The quality of the prep was adequate, using MoviPrep.  The instrument was then slowly withdrawn as the colon was fully examined. <<PROCEDUREIMAGES>>  FINDINGS:  Three polyps were found - 8mm sessile hyperplastic appearringin the cecum, 8mm sessile hp appearring and 5mm depressed lesion . Polyps were snared without cautery. Retrieval was successful.  Moderate diverticulosis was found throughout the colon.   Retroflexed views in the rectum revealed internal hemorrhoids.    The time to cecum = 3:24  minutes. The scope was then withdrawn in 21:52  minutes from the cecum and the procedure completed.  COMPLICATIONS:  None  ENDOSCOPIC IMPRESSION: 1) Three polyps in the colon - removed 2) Moderate diverticulosis throughout the colon 3) Internal and external hemorrhoids  RECOMMENDATIONS: 1) Repeat Colonoscopy in 3 or 5 years pending pathology  results.   ______________________________ Wilhemina Bonito. Eda Keys, MD  CC:  Chilton Greathouse, MD; The Patient  n. eSIGNED:   Wilhemina Bonito. Eda Keys at 06/21/2011 02:17 PM  Shella Maxim, [phone]

## 2011-06-22 ENCOUNTER — Telehealth: Payer: Self-pay | Admitting: Internal Medicine

## 2011-06-22 ENCOUNTER — Telehealth: Payer: Self-pay | Admitting: *Deleted

## 2011-06-22 NOTE — Telephone Encounter (Signed)
  Follow up Call-  Call back number 06/21/2011  Post procedure Call Back phone  # [phone]  Permission to leave phone message Yes     Patient questions:  Do you have a fever, pain , or abdominal swelling? yes Pain Score  3 *  Have you tolerated food without any problems? yes  Have you been able to return to your normal activities? yes  Do you have any questions about your discharge instructions: Diet   no Medications  no Follow up visit  no  Do you have questions or concerns about your Care? no  Actions: * If pain score is 4 or above: No action needed, pain <4.

## 2011-06-22 NOTE — Telephone Encounter (Signed)
Returned patient's call. Patient stating she has gas that she is having difficulty expelling. Patient stating she took a Yoga class this am which helped.Questioned patient's diet, she stated she had a burger with lettuce,tomato and onion with pasta salad and ice cream cake today for lunch. Patient instructed warm fluids and Gas-X would help. Positioning with one knees with head down and buttocks in air also helpful. Patient denies abdominal pain,swelling, fever or bleeding. Patient instructed to call us back with any concerns and especially any symptoms discussed on discharge paperwork. Patient verbalized satisfaction with instructions.

## 2011-06-28 ENCOUNTER — Encounter: Payer: Self-pay | Admitting: Internal Medicine

## 2011-06-28 ENCOUNTER — Ambulatory Visit (INDEPENDENT_AMBULATORY_CARE_PROVIDER_SITE_OTHER): Payer: Medicare Other

## 2011-06-28 DIAGNOSIS — I4891 Unspecified atrial fibrillation: Secondary | ICD-10-CM

## 2011-07-12 ENCOUNTER — Ambulatory Visit (INDEPENDENT_AMBULATORY_CARE_PROVIDER_SITE_OTHER): Payer: Medicare Other

## 2011-07-12 DIAGNOSIS — I4891 Unspecified atrial fibrillation: Secondary | ICD-10-CM

## 2011-07-27 ENCOUNTER — Encounter: Payer: Self-pay | Admitting: Cardiology

## 2011-07-27 ENCOUNTER — Ambulatory Visit (INDEPENDENT_AMBULATORY_CARE_PROVIDER_SITE_OTHER): Payer: Medicare Other

## 2011-07-27 ENCOUNTER — Ambulatory Visit (INDEPENDENT_AMBULATORY_CARE_PROVIDER_SITE_OTHER): Payer: Medicare Other | Admitting: Cardiology

## 2011-07-27 VITALS — BP 127/59 | HR 60 | Ht 61.0 in | Wt 121.1 lb

## 2011-07-27 DIAGNOSIS — I4891 Unspecified atrial fibrillation: Secondary | ICD-10-CM

## 2011-07-27 DIAGNOSIS — I119 Hypertensive heart disease without heart failure: Secondary | ICD-10-CM

## 2011-07-27 DIAGNOSIS — I359 Nonrheumatic aortic valve disorder, unspecified: Secondary | ICD-10-CM

## 2011-07-27 DIAGNOSIS — I480 Paroxysmal atrial fibrillation: Secondary | ICD-10-CM

## 2011-07-27 DIAGNOSIS — K5900 Constipation, unspecified: Secondary | ICD-10-CM

## 2011-07-27 DIAGNOSIS — I351 Nonrheumatic aortic (valve) insufficiency: Secondary | ICD-10-CM

## 2011-07-27 DIAGNOSIS — I10 Essential (primary) hypertension: Secondary | ICD-10-CM

## 2011-07-27 LAB — POCT INR: INR: 3.5

## 2011-07-27 NOTE — Assessment & Plan Note (Signed)
The patient has not been having any headaches or dizzy spells.  Blood pressure has been stable on current therapy

## 2011-07-27 NOTE — Patient Instructions (Signed)
Your physician recommends that you schedule a follow-up appointment in: 4 months with Dr. Patty Sermons.  EKG at 4 month appointment.

## 2011-07-27 NOTE — Assessment & Plan Note (Signed)
The patient reports that she had a recent colonoscopy by Dr. Marina Goodell which was uneventful.  He continues to have a lot of complaints of postprandial gas and bloating and she is going to try staying away from gluten products and see if this makes a difference

## 2011-07-27 NOTE — Assessment & Plan Note (Signed)
The patient has not been having any symptoms of congestive heart failure.  Her recent echocardiogram showed normal systolic function with pseudo-normal diastolic dysfunction

## 2011-07-27 NOTE — Progress Notes (Signed)
[name] Date of Birth:  1933/10/06 [facility] [address] [place], [place]  [address] [phone]  Fax   [phone]  HPI: This pleasant 78 year old woman is seen for a four-month followup office visit.  She has been feeling well.  She has had occasional brief episodes of palpitations.  She had some this past weekend while visiting friends in [place] and it was extremely hot and humid and she felt her heart beating out of rhythm.  Once she was able to rest and get into a cool environment her heart rate settled down.  She reports that overall she has done much better since her pacemaker was adjusted by Dr. Johney Frame at her last visit.  Her exertional dyspnea has improved significantly since then and she is able to do her yoga exercises and water aerobics without difficulty.  she also is able to climb steps without difficulty.  Current Outpatient Prescriptions  Medication Sig Dispense Refill  . amiodarone (PACERONE) 200 MG tablet TAKE ONE-HALF (1/2) TABLET DAILY  45 tablet  3  . Ascorbic Acid (VITAMIN C) 1000 MG tablet Take 1,000 mg by mouth daily.        Marland Kitchen aspirin 81 MG tablet Take 81 mg by mouth daily.        . Bromfenac Sodium (BROMDAY OP) Apply to eye.        . calcium carbonate 200 MG capsule Take 600 mg by mouth daily.       . cholecalciferol (VITAMIN D) 1000 UNITS tablet Take 1,000 Units by mouth daily.        . fexofenadine (ALLEGRA) 180 MG tablet Take 180 mg by mouth as needed.        . fish oil-omega-3 fatty acids 1000 MG capsule Take 1 g by mouth.        Marland Kitchen GLUCOSAMINE-CHONDROITIN-MSM-D PO Take by mouth 2 (two) times daily.      Marland Kitchen levothyroxine (SYNTHROID, LEVOTHROID) 50 MCG tablet Take 50 mcg by mouth daily. po and 75 mcg po alternate days       . metoprolol succinate (TOPROL-XL) 50 MG 24 hr tablet Take 1 tablet (50 mg total) by mouth daily. Now taking 50mg  daily  90 tablet  3  . Multiple Vitamin (MULTIVITAMIN) capsule Take 1 capsule by mouth  daily.        . polyethylene glycol powder (MIRALAX) powder Take by mouth daily. Two capfuls at bedtime      . Probiotic Product (ALIGN PO) Take by mouth as needed.       . verapamil (CALAN-SR) 240 MG CR tablet Take 1 tablet (240 mg total) by mouth at bedtime.  90 tablet  3  . warfarin (COUMADIN) 5 MG tablet TAKE 1 TABLET (5 MG) FOR 5 DAYS AND TAKE ONE-HALF (1/2) TABLET (2.5MG ) FOR 2 DAYS OR AS DIRECTED  90 tablet  1  . zolpidem (AMBIEN) 10 MG tablet Take 10 mg by mouth at bedtime as needed.          No Known Allergies  Patient Active Problem List  Diagnosis  . UNSPECIFIED DISORDER OF THYROID  . COAGULOPATHY  . BRADYCARDIA-TACHYCARDIA SYNDROME  . DIVERTICULOSIS, COLON  . CONSTIPATION  . CONSTIPATION, CHRONIC  . IRRITABLE BOWEL SYNDROME  . ABDOMINAL BLOATING  . CHANGE IN BOWELS  . COLONIC POLYPS, ADENOMATOUS, HX OF  . HYPOTHYROIDISM  . Essential hypertension, benign  . PALPITATIONS  . Atrial fibrillation  . Dyspnea  . Aortic valve insufficiency    History  Smoking status  . Former Smoker  . Types: Cigarettes  . Quit date: 02/09/1980  Smokeless tobacco  . Never Used    History  Alcohol Use  . 8.4 oz/week  . 14 Glasses of wine per week    Family History  Problem Relation Age of Onset  . Heart failure Mother   . Heart attack Father   . Colon cancer Neg Hx     Review of Systems: The patient denies any heat or cold intolerance.  No weight gain or weight loss.  The patient denies headaches or blurry vision.  There is no cough or sputum production.  The patient denies dizziness.  There is no hematuria or hematochezia.  The patient denies any muscle aches or arthritis.  The patient denies any rash.  The patient denies frequent falling or instability.  There is no history of depression or anxiety.  All other systems were reviewed and are negative.   Physical Exam: Filed Vitals:   07/27/11 1331  BP: 127/59  Pulse: 60   the general appearance reveals a well-developed  well-nourished woman in no distress.The head and neck exam reveals pupils equal and reactive.  Extraocular movements are full.  There is no scleral icterus.  The mouth and pharynx are normal.  The neck is supple.  The carotids reveal no bruits.  The jugular venous pressure is normal.  The  thyroid is not enlarged.  There is no lymphadenopathy.  The chest is clear to percussion and auscultation.  There are no rales or rhonchi.  Expansion of the chest is symmetrical.  The precordium is quiet.  The first heart sound is normal.  The second heart sound is physiologically split.  There is no murmur gallop rub or click.  There is no abnormal lift or heave.  The abdomen is soft and nontender.  The bowel sounds are normal.  The liver and spleen are not enlarged.  There are no abdominal masses.  There are no abdominal bruits.  Extremities reveal good pedal pulses.  There is no phlebitis or edema.  There is no cyanosis or clubbing.  Strength is normal and symmetrical in all extremities.  There is no lateralizing weakness.  There are no sensory deficits.  The skin is warm and dry.  There is no rash.      Assessment / Plan: The patient is to continue same medication.  She will recheck in 4 months for followup office visit and EKG.

## 2011-07-29 ENCOUNTER — Encounter: Payer: Self-pay | Admitting: Internal Medicine

## 2011-07-29 ENCOUNTER — Ambulatory Visit (INDEPENDENT_AMBULATORY_CARE_PROVIDER_SITE_OTHER): Payer: Medicare Other | Admitting: *Deleted

## 2011-07-29 DIAGNOSIS — I495 Sick sinus syndrome: Secondary | ICD-10-CM

## 2011-07-29 DIAGNOSIS — I4891 Unspecified atrial fibrillation: Secondary | ICD-10-CM

## 2011-07-29 LAB — MDC_IDC_ENUM_SESS_TYPE_REMOTE
Brady Statistic AS VS Percent: 0.35 %
Brady Statistic RV Percent Paced: 0 %
Lead Channel Impedance Value: 544 Ohm
Lead Channel Impedance Value: 552 Ohm
Lead Channel Sensing Intrinsic Amplitude: 1.3695
Lead Channel Sensing Intrinsic Amplitude: 9.3722
Lead Channel Setting Pacing Amplitude: 2 V
MDC IDC MSMT BATTERY VOLTAGE: 3 V
MDC IDC SESS DTM: 20130620133623
MDC IDC SET LEADCHNL RV PACING AMPLITUDE: 2.5 V
MDC IDC SET LEADCHNL RV PACING PULSEWIDTH: 0.4 ms
MDC IDC SET LEADCHNL RV SENSING SENSITIVITY: 0.9 mV
MDC IDC SET ZONE DETECTION INTERVAL: 350 ms
MDC IDC STAT BRADY AP VP PERCENT: 0 %
MDC IDC STAT BRADY AP VS PERCENT: 99.65 %
MDC IDC STAT BRADY AS VP PERCENT: 0 %
MDC IDC STAT BRADY RA PERCENT PACED: 99.65 %
Zone Setting Detection Interval: 400 ms

## 2011-08-09 LAB — REMOTE PACEMAKER DEVICE
AL AMPLITUDE: 1.4 mv
ATRIAL PACING PM: 97.57
BATTERY VOLTAGE: 3 V
VENTRICULAR PACING PM: 0.06

## 2011-08-10 ENCOUNTER — Ambulatory Visit (INDEPENDENT_AMBULATORY_CARE_PROVIDER_SITE_OTHER): Payer: Medicare Other | Admitting: *Deleted

## 2011-08-10 DIAGNOSIS — I4891 Unspecified atrial fibrillation: Secondary | ICD-10-CM

## 2011-08-10 LAB — POCT INR: INR: 2.9

## 2011-08-23 ENCOUNTER — Encounter: Payer: Self-pay | Admitting: *Deleted

## 2011-08-31 ENCOUNTER — Ambulatory Visit (INDEPENDENT_AMBULATORY_CARE_PROVIDER_SITE_OTHER): Payer: Medicare Other

## 2011-08-31 DIAGNOSIS — I4891 Unspecified atrial fibrillation: Secondary | ICD-10-CM

## 2011-09-28 ENCOUNTER — Ambulatory Visit (INDEPENDENT_AMBULATORY_CARE_PROVIDER_SITE_OTHER): Payer: Medicare Other | Admitting: Pharmacist

## 2011-09-28 DIAGNOSIS — I4891 Unspecified atrial fibrillation: Secondary | ICD-10-CM

## 2011-09-28 LAB — POCT INR: INR: 2.7

## 2011-10-26 ENCOUNTER — Ambulatory Visit (INDEPENDENT_AMBULATORY_CARE_PROVIDER_SITE_OTHER): Payer: Medicare Other | Admitting: Pharmacist

## 2011-10-26 DIAGNOSIS — I4891 Unspecified atrial fibrillation: Secondary | ICD-10-CM

## 2011-10-26 LAB — POCT INR: INR: 2.8

## 2011-11-02 ENCOUNTER — Other Ambulatory Visit (HOSPITAL_COMMUNITY): Payer: Self-pay | Admitting: Sports Medicine

## 2011-11-02 DIAGNOSIS — M79605 Pain in left leg: Secondary | ICD-10-CM

## 2011-11-08 ENCOUNTER — Ambulatory Visit (INDEPENDENT_AMBULATORY_CARE_PROVIDER_SITE_OTHER): Payer: Medicare Other | Admitting: *Deleted

## 2011-11-08 ENCOUNTER — Encounter: Payer: Self-pay | Admitting: Internal Medicine

## 2011-11-08 DIAGNOSIS — I495 Sick sinus syndrome: Secondary | ICD-10-CM

## 2011-11-08 DIAGNOSIS — I4891 Unspecified atrial fibrillation: Secondary | ICD-10-CM

## 2011-11-09 LAB — REMOTE PACEMAKER DEVICE
AL AMPLITUDE: 1.3 mv
BAMS-0001: 170 {beats}/min
RV LEAD IMPEDENCE PM: 520 Ohm

## 2011-11-09 NOTE — Progress Notes (Signed)
Remote pacer check  

## 2011-11-12 ENCOUNTER — Encounter (HOSPITAL_COMMUNITY)
Admission: RE | Admit: 2011-11-12 | Discharge: 2011-11-12 | Disposition: A | Discharge status: Home / Self Care | Payer: Medicare Other | Source: Ambulatory Visit | Attending: Sports Medicine | Admitting: Sports Medicine

## 2011-11-12 ENCOUNTER — Encounter (HOSPITAL_COMMUNITY): Payer: Self-pay

## 2011-11-12 DIAGNOSIS — M25559 Pain in unspecified hip: Secondary | ICD-10-CM | POA: Insufficient documentation

## 2011-11-12 DIAGNOSIS — M25569 Pain in unspecified knee: Secondary | ICD-10-CM | POA: Insufficient documentation

## 2011-11-12 DIAGNOSIS — M79609 Pain in unspecified limb: Secondary | ICD-10-CM | POA: Insufficient documentation

## 2011-11-12 DIAGNOSIS — M79605 Pain in left leg: Secondary | ICD-10-CM

## 2011-11-12 MED ORDER — TECHNETIUM TC 99M MEDRONATE IV KIT
25.0000 | PACK | Freq: Once | INTRAVENOUS | Status: AC | PRN
  Administered 2011-11-12: 25 via INTRAVENOUS

## 2011-11-17 ENCOUNTER — Encounter: Payer: Self-pay | Admitting: *Deleted

## 2011-11-17 ENCOUNTER — Other Ambulatory Visit: Payer: Self-pay | Admitting: Sports Medicine

## 2011-11-17 DIAGNOSIS — IMO0002 Reserved for concepts with insufficient information to code with codable children: Secondary | ICD-10-CM

## 2011-11-19 ENCOUNTER — Ambulatory Visit
Admission: RE | Admit: 2011-11-19 | Discharge: 2011-11-19 | Disposition: A | Discharge status: Home / Self Care | Payer: Medicare Other | Source: Ambulatory Visit | Attending: Sports Medicine | Admitting: Sports Medicine

## 2011-11-19 DIAGNOSIS — IMO0002 Reserved for concepts with insufficient information to code with codable children: Secondary | ICD-10-CM

## 2011-11-22 ENCOUNTER — Ambulatory Visit (INDEPENDENT_AMBULATORY_CARE_PROVIDER_SITE_OTHER): Payer: Medicare Other | Admitting: *Deleted

## 2011-11-22 ENCOUNTER — Encounter: Payer: Self-pay | Admitting: Cardiology

## 2011-11-22 ENCOUNTER — Ambulatory Visit (INDEPENDENT_AMBULATORY_CARE_PROVIDER_SITE_OTHER): Payer: Medicare Other | Admitting: Cardiology

## 2011-11-22 VITALS — BP 122/70 | HR 62 | Ht 61.0 in | Wt 123.4 lb

## 2011-11-22 DIAGNOSIS — I10 Essential (primary) hypertension: Secondary | ICD-10-CM

## 2011-11-22 DIAGNOSIS — I480 Paroxysmal atrial fibrillation: Secondary | ICD-10-CM

## 2011-11-22 DIAGNOSIS — R002 Palpitations: Secondary | ICD-10-CM

## 2011-11-22 DIAGNOSIS — I4891 Unspecified atrial fibrillation: Secondary | ICD-10-CM

## 2011-11-22 DIAGNOSIS — M5432 Sciatica, left side: Secondary | ICD-10-CM

## 2011-11-22 DIAGNOSIS — M5430 Sciatica, unspecified side: Secondary | ICD-10-CM

## 2011-11-22 NOTE — Assessment & Plan Note (Signed)
The patient has not been having any headaches or dizzy spells.  Blood pressure has been good when she checks it at home and it is good today here  in the office

## 2011-11-22 NOTE — Patient Instructions (Addendum)
Your physician recommends that you continue on your current medications as directed. Please refer to the Current Medication list given to you today.  Your physician recommends that you schedule a follow-up appointment in: 4 months  

## 2011-11-22 NOTE — Progress Notes (Signed)
Kathryn Gibson Date of Birth:  May 20, 1933 [facility] [address] [place], [place]  [address] [phone]  Fax   [phone]  HPI: This pleasant 78 year old woman is seen for a four-month followup office visit. She has been feeling well in regard to her heart. She has had occasional brief episodes of palpitations.  She has not been able to exercise as much this time because she has been experiencing sciatica of her left thigh and leg.  She does not experience discomfort at rest but does experience discomfort when she walks.  It has also interfered with her using an exercise bicycle and doing water aerobics exercises.   Current Outpatient Prescriptions  Medication Sig Dispense Refill  . amiodarone (PACERONE) 200 MG tablet TAKE ONE-HALF (1/2) TABLET DAILY  45 tablet  3  . Ascorbic Acid (VITAMIN C) 1000 MG tablet Take 1,000 mg by mouth daily.        Marland Kitchen aspirin 81 MG tablet Take 81 mg by mouth daily.        . Bromfenac Sodium (BROMDAY OP) Apply to eye.        . calcium carbonate 200 MG capsule Take 600 mg by mouth daily.       . cholecalciferol (VITAMIN D) 1000 UNITS tablet Take 1,000 Units by mouth daily.        . fexofenadine (ALLEGRA) 180 MG tablet Take 180 mg by mouth as needed.        . fish oil-omega-3 fatty acids 1000 MG capsule Take 1 g by mouth.        Marland Kitchen GLUCOSAMINE-CHONDROITIN-MSM-D PO Take by mouth 2 (two) times daily.      Marland Kitchen levothyroxine (SYNTHROID, LEVOTHROID) 50 MCG tablet Take 50 mcg by mouth daily. po and 75 mcg po alternate days       . metoprolol succinate (TOPROL-XL) 50 MG 24 hr tablet Take 1 tablet (50 mg total) by mouth daily. Now taking 50mg  daily  90 tablet  3  . Multiple Vitamin (MULTIVITAMIN) capsule Take 1 capsule by mouth daily.        . polyethylene glycol powder (MIRALAX) powder Take by mouth daily. Two capfuls at bedtime      . Probiotic Product (ALIGN PO) Take by mouth as needed.       . verapamil (CALAN-SR) 240 MG CR tablet Take  1 tablet (240 mg total) by mouth at bedtime.  90 tablet  3  . warfarin (COUMADIN) 5 MG tablet TAKE 1 TABLET (5 MG) FOR 5 DAYS AND TAKE ONE-HALF (1/2) TABLET (2.5MG ) FOR 2 DAYS OR AS DIRECTED  90 tablet  1  . zolpidem (AMBIEN) 10 MG tablet Take 10 mg by mouth at bedtime as needed.          No Known Allergies  Patient Active Problem List  Diagnosis  . UNSPECIFIED DISORDER OF THYROID  . COAGULOPATHY  . BRADYCARDIA-TACHYCARDIA SYNDROME  . DIVERTICULOSIS, COLON  . CONSTIPATION  . CONSTIPATION, CHRONIC  . IRRITABLE BOWEL SYNDROME  . ABDOMINAL BLOATING  . CHANGE IN BOWELS  . COLONIC POLYPS, ADENOMATOUS, HX OF  . HYPOTHYROIDISM  . Essential hypertension, benign  . PALPITATIONS  . Atrial fibrillation  . Dyspnea  . Aortic valve insufficiency    History  Smoking status  . Former Smoker  . Types: Cigarettes  . Quit date: 02/09/1980  Smokeless tobacco  . Never Used    History  Alcohol Use  . 8.4 oz/week  . 14 Glasses of wine per week  Family History  Problem Relation Age of Onset  . Heart failure Mother   . Heart attack Father   . Colon cancer Neg Hx     Review of Systems: The patient denies any heat or cold intolerance.  No weight gain or weight loss.  The patient denies headaches or blurry vision.  There is no cough or sputum production.  The patient denies dizziness.  There is no hematuria or hematochezia.  The patient denies any muscle aches or arthritis.  The patient denies any rash.  The patient denies frequent falling or instability.  There is no history of depression or anxiety.  All other systems were reviewed and are negative.   Physical Exam: Filed Vitals:   11/22/11 1339  BP: 122/70  Pulse: 62   the general appearance reveals a well-developed well-nourished woman in no distress.The head and neck exam reveals pupils equal and reactive.  Extraocular movements are full.  There is no scleral icterus.  The mouth and pharynx are normal.  The neck is supple.  The  carotids reveal no bruits.  The jugular venous pressure is normal.  The  thyroid is not enlarged.  There is no lymphadenopathy.  The chest is clear to percussion and auscultation.  There are no rales or rhonchi.  Expansion of the chest is symmetrical.  The precordium is quiet.  The first heart sound is normal.  The second heart sound is physiologically split.  There is no murmur gallop rub or click.  There is no abnormal lift or heave.  The abdomen is soft and nontender.  The bowel sounds are normal.  The liver and spleen are not enlarged.  There are no abdominal masses.  There are no abdominal bruits.  Extremities reveal good pedal pulses.  There is no phlebitis or edema.  There is no cyanosis or clubbing.  Strength is normal and symmetrical in all extremities.  There is no lateralizing weakness.  There are no sensory deficits.  The skin is warm and dry.  There is no rash.  EKG shows atrial paced rhythm with normal native QRS complexes    Assessment / Plan: Continue on same medication and be rechecked in 4 months

## 2011-11-22 NOTE — Assessment & Plan Note (Signed)
Patient has occasional sensation of brief palpitations.  She is on long-term anticoagulation with Coumadin.  She has not had any TIA symptoms

## 2011-11-22 NOTE — Assessment & Plan Note (Signed)
The patient is being followed by Dr. Rodolph Bong [facility]  She has also seen a chiropractor and has had some light massage therapy.  She is hopeful that the symptoms will start to resolve so that she can get back to her regular exercise routine.

## 2011-12-06 ENCOUNTER — Other Ambulatory Visit: Payer: Self-pay | Admitting: *Deleted

## 2011-12-06 ENCOUNTER — Other Ambulatory Visit: Payer: Self-pay

## 2011-12-06 DIAGNOSIS — I4891 Unspecified atrial fibrillation: Secondary | ICD-10-CM

## 2011-12-06 MED ORDER — WARFARIN SODIUM 5 MG PO TABS: ORAL_TABLET | ORAL | Status: DC

## 2011-12-10 ENCOUNTER — Other Ambulatory Visit: Payer: Self-pay | Admitting: *Deleted

## 2011-12-10 MED ORDER — AMIODARONE HCL 200 MG PO TABS: ORAL_TABLET | ORAL | Status: DC

## 2012-01-03 ENCOUNTER — Ambulatory Visit (INDEPENDENT_AMBULATORY_CARE_PROVIDER_SITE_OTHER): Payer: Medicare Other | Admitting: *Deleted

## 2012-01-03 DIAGNOSIS — I4891 Unspecified atrial fibrillation: Secondary | ICD-10-CM

## 2012-01-03 LAB — POCT INR: INR: 3

## 2012-02-12 ENCOUNTER — Encounter: Payer: Self-pay | Admitting: Internal Medicine

## 2012-02-14 ENCOUNTER — Ambulatory Visit (INDEPENDENT_AMBULATORY_CARE_PROVIDER_SITE_OTHER): Payer: Medicare Other | Admitting: *Deleted

## 2012-02-14 DIAGNOSIS — I495 Sick sinus syndrome: Secondary | ICD-10-CM

## 2012-02-14 DIAGNOSIS — I4891 Unspecified atrial fibrillation: Secondary | ICD-10-CM

## 2012-02-14 DIAGNOSIS — Z950 Presence of cardiac pacemaker: Secondary | ICD-10-CM

## 2012-02-16 LAB — REMOTE PACEMAKER DEVICE
AL AMPLITUDE: 1.3 mv
ATRIAL PACING PM: 97.81
BAMS-0001: 170 {beats}/min
RV LEAD IMPEDENCE PM: 528 Ohm

## 2012-02-24 ENCOUNTER — Encounter: Payer: Self-pay | Admitting: *Deleted

## 2012-02-28 ENCOUNTER — Other Ambulatory Visit: Payer: Self-pay | Admitting: Obstetrics and Gynecology

## 2012-02-28 DIAGNOSIS — Z1231 Encounter for screening mammogram for malignant neoplasm of breast: Secondary | ICD-10-CM

## 2012-03-14 ENCOUNTER — Other Ambulatory Visit: Payer: Self-pay

## 2012-03-14 MED ORDER — VERAPAMIL HCL ER 240 MG PO TBCR: 240.0000 mg | EXTENDED_RELEASE_TABLET | Freq: Every day | ORAL | Status: DC

## 2012-03-22 ENCOUNTER — Ambulatory Visit (INDEPENDENT_AMBULATORY_CARE_PROVIDER_SITE_OTHER): Payer: Medicare Other | Admitting: *Deleted

## 2012-03-22 ENCOUNTER — Ambulatory Visit: Payer: Medicare Other | Admitting: Cardiology

## 2012-03-22 DIAGNOSIS — I4891 Unspecified atrial fibrillation: Secondary | ICD-10-CM

## 2012-03-22 LAB — POCT INR: INR: 1.9

## 2012-03-25 ENCOUNTER — Other Ambulatory Visit: Payer: Self-pay

## 2012-03-29 ENCOUNTER — Ambulatory Visit: Payer: Medicare Other | Admitting: Cardiology

## 2012-04-03 ENCOUNTER — Ambulatory Visit
Admission: RE | Admit: 2012-04-03 | Discharge: 2012-04-03 | Disposition: A | Discharge status: Home / Self Care | Payer: Medicare Other | Source: Ambulatory Visit | Attending: Obstetrics and Gynecology | Admitting: Obstetrics and Gynecology

## 2012-04-03 DIAGNOSIS — Z1231 Encounter for screening mammogram for malignant neoplasm of breast: Secondary | ICD-10-CM

## 2012-04-06 ENCOUNTER — Ambulatory Visit (INDEPENDENT_AMBULATORY_CARE_PROVIDER_SITE_OTHER): Payer: Medicare Other

## 2012-04-06 ENCOUNTER — Encounter: Payer: Self-pay | Admitting: Cardiology

## 2012-04-06 ENCOUNTER — Ambulatory Visit (INDEPENDENT_AMBULATORY_CARE_PROVIDER_SITE_OTHER): Payer: Medicare Other | Admitting: Cardiology

## 2012-04-06 VITALS — BP 122/68 | HR 60 | Ht 61.5 in | Wt 122.4 lb

## 2012-04-06 DIAGNOSIS — I480 Paroxysmal atrial fibrillation: Secondary | ICD-10-CM

## 2012-04-06 DIAGNOSIS — I351 Nonrheumatic aortic (valve) insufficiency: Secondary | ICD-10-CM

## 2012-04-06 DIAGNOSIS — I4891 Unspecified atrial fibrillation: Secondary | ICD-10-CM

## 2012-04-06 DIAGNOSIS — I359 Nonrheumatic aortic valve disorder, unspecified: Secondary | ICD-10-CM

## 2012-04-06 LAB — POCT INR: INR: 2.5

## 2012-04-06 MED ORDER — AMIODARONE HCL 200 MG PO TABS: ORAL_TABLET | ORAL | Status: DC

## 2012-04-06 NOTE — Progress Notes (Signed)
Kathryn Gibson Date of Birth:  11-30-1933 [facility] [address] [place], [place]  [address] [phone]         Fax   [phone]  History of Present Illness: This pleasant 79 year old woman is seen for a four-month followup office visit. She has been feeling well in regard to her heart. She has had occasional brief episodes of palpitations.  Since last visit she has been doing well from the cardiac standpoint.  She estimates that she has brief runs of paroxysmal atrial fibrillation perhaps once every 2 weeks and the episode might last about 40 minutes.  Interestingly since she started to take her verapamil in the morning she feels that she has been having less episodes of palpitations.  She has not been getting as much regular exercise because the swimming pool out at [facility] has been closed for renovations for the past several months.   Current Outpatient Prescriptions  Medication Sig Dispense Refill  . amiodarone (PACERONE) 200 MG tablet Take 1/2 tablet daily  45 tablet  3  . Ascorbic Acid (VITAMIN C) 1000 MG tablet Take 1,000 mg by mouth daily.        Marland Kitchen aspirin 81 MG tablet Take 81 mg by mouth daily.        . Bromfenac Sodium (BROMDAY OP) Apply to eye.        . calcium carbonate 200 MG capsule Take 600 mg by mouth daily.       . cholecalciferol (VITAMIN D) 1000 UNITS tablet Take 1,000 Units by mouth daily.        . DULoxetine (CYMBALTA) 30 MG capsule Take 30 mg by mouth daily.      . fexofenadine (ALLEGRA) 180 MG tablet Take 180 mg by mouth as needed.        . fish oil-omega-3 fatty acids 1000 MG capsule Take 1 g by mouth.        Marland Kitchen GLUCOSAMINE-CHONDROITIN-MSM-D PO Take by mouth 2 (two) times daily.      Marland Kitchen levothyroxine (SYNTHROID, LEVOTHROID) 50 MCG tablet Take 50 mcg by mouth daily. po and 75 mcg po alternate days       . metoprolol succinate (TOPROL-XL) 50 MG 24 hr tablet Take 1 tablet (50 mg total) by mouth daily. Now taking 50mg  daily  90 tablet   3  . Multiple Vitamin (MULTIVITAMIN) capsule Take 1 capsule by mouth daily.        . polyethylene glycol powder (MIRALAX) powder Take by mouth daily. Two capfuls at bedtime      . Probiotic Product (ALIGN PO) Take by mouth as needed.       . verapamil (CALAN-SR) 240 MG CR tablet Take 1 tablet (240 mg total) by mouth at bedtime.  90 tablet  4  . warfarin (COUMADIN) 5 MG tablet TAKE 1 TABLET (5 MG) FOR 5 DAYS AND TAKE ONE-HALF (1/2) TABLET (2.5MG ) FOR 2 DAYS OR AS DIRECTED  90 tablet  1  . zolpidem (AMBIEN) 10 MG tablet Take 10 mg by mouth at bedtime as needed.         No current facility-administered medications for this visit.    No Known Allergies  Patient Active Problem List  Diagnosis  . UNSPECIFIED DISORDER OF THYROID  . COAGULOPATHY  . BRADYCARDIA-TACHYCARDIA SYNDROME  . DIVERTICULOSIS, COLON  . CONSTIPATION  . CONSTIPATION, CHRONIC  . IRRITABLE BOWEL SYNDROME  . ABDOMINAL BLOATING  . CHANGE IN BOWELS  . COLONIC POLYPS, ADENOMATOUS, HX OF  .  HYPOTHYROIDISM  . Essential hypertension, benign  . PALPITATIONS  . Atrial fibrillation  . Dyspnea  . Aortic valve insufficiency  . Sciatica of left side    History  Smoking status  . Former Smoker  . Types: Cigarettes  . Quit date: 02/09/1980  Smokeless tobacco  . Never Used    History  Alcohol Use  . 8.4 oz/week  . 14 Glasses of wine per week    Family History  Problem Relation Age of Onset  . Heart failure Mother   . Heart attack Father   . Colon cancer Neg Hx     Review of Systems: Constitutional: no fever chills diaphoresis or fatigue or change in weight.  Head and neck: no hearing loss, no epistaxis, no photophobia or visual disturbance. Respiratory: No cough, shortness of breath or wheezing. Cardiovascular: No chest pain peripheral edema, palpitations. Gastrointestinal: No abdominal distention, no abdominal pain, no change in bowel habits hematochezia or melena. Genitourinary: No dysuria, no frequency, no  urgency, no nocturia. Musculoskeletal:No arthralgias, no back pain, no gait disturbance or myalgias. Neurological: No dizziness, no headaches, no numbness, no seizures, no syncope, no weakness, no tremors. Hematologic: No lymphadenopathy, no easy bruising. Psychiatric: No confusion, no hallucinations, no sleep disturbance.    Physical Exam: Filed Vitals:   04/06/12 1437  BP: 122/68  Pulse: 60   the general appearance reveals a well-developed well-nourished woman in no distress.The head and neck exam reveals pupils equal and reactive.  Extraocular movements are full.  There is no scleral icterus.  The mouth and pharynx are normal.  The neck is supple.  The carotids reveal no bruits.  The jugular venous pressure is normal.  The  thyroid is not enlarged.  There is no lymphadenopathy.  The chest is clear to percussion and auscultation.  There are no rales or rhonchi.  Expansion of the chest is symmetrical.  The precordium is quiet.  The first heart sound is normal.  The second heart sound is physiologically split.  There is no murmur gallop rub or click.  There is no abnormal lift or heave.  The abdomen is soft and nontender.  The bowel sounds are normal.  The liver and spleen are not enlarged.  There are no abdominal masses.  There are no abdominal bruits.  Extremities reveal good pedal pulses.  There is no phlebitis or edema.  There is no cyanosis or clubbing.  Strength is normal and symmetrical in all extremities.  There is no lateralizing weakness.  There are no sensory deficits.  The skin is warm and dry.  There is no rash.     Assessment / Plan: Continue same medication.  Recheck in 4 months for followup office visit and EKG

## 2012-04-06 NOTE — Assessment & Plan Note (Signed)
The patient is not having a symptoms of congestive heart failure.  She has had some mild intermittent ankle edema after taking long car trips such as a recent trip to [place] with friends

## 2012-04-06 NOTE — Assessment & Plan Note (Signed)
The patient has done well with her long-term Coumadin.  She's had no TIA symptoms.

## 2012-04-06 NOTE — Patient Instructions (Addendum)
Your physician recommends that you continue on your current medications as directed. Please refer to the Current Medication list given to you today.  Your physician recommends that you schedule a follow-up appointment in: 4 months ov/ekg

## 2012-04-24 ENCOUNTER — Other Ambulatory Visit: Payer: Self-pay | Admitting: *Deleted

## 2012-04-24 DIAGNOSIS — I4891 Unspecified atrial fibrillation: Secondary | ICD-10-CM

## 2012-04-24 MED ORDER — METOPROLOL SUCCINATE ER 50 MG PO TB24: 50.0000 mg | ORAL_TABLET | Freq: Every day | ORAL | Status: DC

## 2012-04-24 MED ORDER — WARFARIN SODIUM 5 MG PO TABS: 5.0000 mg | ORAL_TABLET | ORAL | Status: DC

## 2012-05-03 ENCOUNTER — Encounter: Payer: Self-pay | Admitting: Internal Medicine

## 2012-05-03 ENCOUNTER — Ambulatory Visit (INDEPENDENT_AMBULATORY_CARE_PROVIDER_SITE_OTHER): Payer: Medicare Other | Admitting: Internal Medicine

## 2012-05-03 ENCOUNTER — Ambulatory Visit (INDEPENDENT_AMBULATORY_CARE_PROVIDER_SITE_OTHER): Payer: Medicare Other | Admitting: *Deleted

## 2012-05-03 VITALS — BP 136/82 | HR 64 | Ht 61.5 in | Wt 124.2 lb

## 2012-05-03 DIAGNOSIS — I4891 Unspecified atrial fibrillation: Secondary | ICD-10-CM

## 2012-05-03 DIAGNOSIS — I495 Sick sinus syndrome: Secondary | ICD-10-CM

## 2012-05-03 LAB — PACEMAKER DEVICE OBSERVATION
AL IMPEDENCE PM: 544 Ohm
ATRIAL PACING PM: 97.88
RV LEAD IMPEDENCE PM: 544 Ohm
VENTRICULAR PACING PM: 0.1

## 2012-05-03 NOTE — Progress Notes (Signed)
PCP:  Hoyle Sauer, MD Primary Cardiologist:  Dr Patty Sermons  The patient presents today for routine electrophysiology followup.  Since last being seen in our clinic, the patient reports doing very well.  She has a h/o atrial fibrillation and tachycardia/bradycardia syndrome s/p PPM (MDT) by Dr Deborah Chalk  12/17/08.  She remains active despite his age.  She lives at [facility] and exercises there frequently.  She reports rare palpitations.  She denies chest pain, shortness of breath, orthopnea, PND, lower extremity edema, dizziness, presyncope, syncope, or neurologic sequela. The patient is tolerating medications without difficulties and is otherwise without complaint today.    Past Medical History  Diagnosis Date  . Atrial fibrillation   . Hypertension   . Hypothyroidism   . Irritable bowel syndrome (IBS)   . Constipation   . Colonic polyp   . Diverticulosis   . Bradycardia     s/p PPM  . Hemorrhoids   . Arthritis   . Cataract    Past Surgical History  Procedure Laterality Date  . Pacemaker insertion  2010    implanted by Dr Deborah Chalk (MDT)  . Colonoscopy      Current Outpatient Prescriptions  Medication Sig Dispense Refill  . amiodarone (PACERONE) 200 MG tablet Take 1/2 tablet daily  45 tablet  3  . Ascorbic Acid (VITAMIN C) 1000 MG tablet Take 1,000 mg by mouth daily.        Marland Kitchen aspirin 81 MG tablet Take 81 mg by mouth daily.        . Bromfenac Sodium (BROMDAY OP) Apply to eye.        . calcium carbonate 200 MG capsule Take 600 mg by mouth daily.       . cholecalciferol (VITAMIN D) 1000 UNITS tablet Take 1,000 Units by mouth daily.        . clobetasol (OLUX) 0.05 % topical foam       . DULoxetine (CYMBALTA) 30 MG capsule Take 30 mg by mouth daily.      . fexofenadine (ALLEGRA) 180 MG tablet Take 180 mg by mouth as needed.        . fish oil-omega-3 fatty acids 1000 MG capsule Take 1 g by mouth.        Marland Kitchen GLUCOSAMINE-CHONDROITIN-MSM-D PO Take by mouth 2 (two) times daily.      Marland Kitchen  levothyroxine (SYNTHROID, LEVOTHROID) 50 MCG tablet Take 50 mcg by mouth daily. po and 75 mcg po alternate days       . metoprolol succinate (TOPROL-XL) 50 MG 24 hr tablet Take 1 tablet (50 mg total) by mouth daily.  90 tablet  3  . Multiple Vitamin (MULTIVITAMIN) capsule Take 1 capsule by mouth daily.        . polyethylene glycol powder (MIRALAX) powder Take by mouth daily. Two capfuls at bedtime      . Probiotic Product (ALIGN PO) Take by mouth as needed.       . traZODone (DESYREL) 50 MG tablet       . verapamil (CALAN-SR) 240 MG CR tablet Take 1 tablet (240 mg total) by mouth at bedtime.  90 tablet  4  . warfarin (COUMADIN) 5 MG tablet Take 1 tablet (5 mg total) by mouth as directed.  90 tablet  1  . zolpidem (AMBIEN) 10 MG tablet Take 10 mg by mouth at bedtime as needed.         No current facility-administered medications for this visit.    No Known Allergies  History  Social History  . Marital Status: Married    Spouse Name: N/A    Number of Children: N/A  . Years of Education: N/A   Occupational History  . Not on file.   Social History Main Topics  . Smoking status: Former Smoker    Types: Cigarettes    Quit date: 02/09/1980  . Smokeless tobacco: Never Used  . Alcohol Use: 8.4 oz/week    14 Glasses of wine per week  . Drug Use: No  . Sexually Active: Not on file   Other Topics Concern  . Not on file   Social History Narrative  . No narrative on file    Family History  Problem Relation Age of Onset  . Heart failure Mother   . Heart attack Father   . Colon cancer Neg Hx     Physical Exam: Filed Vitals:   05/03/12 1354  BP: 136/82  Pulse: 64  Height: 5' 1.5" (1.562 m)  Weight: 124 lb 3.2 oz (56.337 kg)  SpO2: 98%    GEN- The patient is well appearing, alert and oriented x 3 today.   Head- normocephalic, atraumatic Eyes-  Sclera clear, conjunctiva pink Ears- hearing intact Oropharynx- clear Neck- supple, no JVP Lymph- no cervical  lymphadenopathy Lungs- Clear to ausculation bilaterally, normal work of breathing Chest- pacemaker pocket is well healed Heart- Regular rate and rhythm, no murmurs, rubs or gallops, PMI not laterally displaced GI- soft, NT, ND, + BS Extremities- no clubbing, cyanosis, or edema  Pacemaker interrogation- reviewed in detail today,  See PACEART report  Assessment and Plan:  1. afib Continue current medicine long temr  2. Tachy/brady Normal pacemaker function See Pace Art report Histogram is flat.  Will adjust sensor from medium high to medium low  Return in 1 year

## 2012-05-03 NOTE — Patient Instructions (Addendum)
Your physician wants you to follow-up in: 12 months with Dr Jacquiline Doe will receive a reminder letter in the mail two months in advance. If you don't receive a letter, please call our office to schedule the follow-up appointment.   Remote monitoring is used to monitor your Pacemaker of ICD from home. This monitoring reduces the number of office visits required to check your device to one time per year. It allows [place] to keep an eye on the functioning of your device to ensure it is working properly. You are scheduled for a device check from home on 08/07/12. You may send your transmission at any time that day. If you have a wireless device, the transmission will be sent automatically. After your physician reviews your transmission, you will receive a postcard with your next transmission date.

## 2012-06-08 ENCOUNTER — Ambulatory Visit (INDEPENDENT_AMBULATORY_CARE_PROVIDER_SITE_OTHER): Payer: Medicare Other | Admitting: *Deleted

## 2012-06-08 DIAGNOSIS — I4891 Unspecified atrial fibrillation: Secondary | ICD-10-CM

## 2012-07-17 ENCOUNTER — Ambulatory Visit (INDEPENDENT_AMBULATORY_CARE_PROVIDER_SITE_OTHER): Payer: Medicare Other | Admitting: *Deleted

## 2012-07-17 ENCOUNTER — Telehealth: Payer: Self-pay | Admitting: Cardiology

## 2012-07-17 DIAGNOSIS — I4891 Unspecified atrial fibrillation: Secondary | ICD-10-CM

## 2012-07-17 LAB — POCT INR: INR: 3.2

## 2012-07-17 NOTE — Telephone Encounter (Signed)
New Prob     Pt has some questions regarding her pacemaker card. Please call.

## 2012-07-17 NOTE — Telephone Encounter (Signed)
Fast busy signal  Will try later

## 2012-07-18 NOTE — Telephone Encounter (Signed)
LMOM for return call//kwm  

## 2012-07-19 NOTE — Telephone Encounter (Signed)
LMOM for return call//kwm  

## 2012-07-20 NOTE — Telephone Encounter (Signed)
LMOM for return call, closing encounter due to several messages being left with no return call.

## 2012-07-26 ENCOUNTER — Encounter: Payer: Self-pay | Admitting: Cardiology

## 2012-07-26 ENCOUNTER — Ambulatory Visit (INDEPENDENT_AMBULATORY_CARE_PROVIDER_SITE_OTHER): Payer: Medicare Other | Admitting: Cardiology

## 2012-07-26 VITALS — BP 128/68 | HR 64 | Ht 61.5 in | Wt 120.8 lb

## 2012-07-26 DIAGNOSIS — I4891 Unspecified atrial fibrillation: Secondary | ICD-10-CM

## 2012-07-26 DIAGNOSIS — I495 Sick sinus syndrome: Secondary | ICD-10-CM

## 2012-07-26 DIAGNOSIS — Z79899 Other long term (current) drug therapy: Secondary | ICD-10-CM

## 2012-07-26 NOTE — Assessment & Plan Note (Signed)
EKG today shows that she is in a paced atrial rhythm.  She is maintaining normal sinus rhythm.  She is on low-dose amiodarone 100 mg daily.  She is not showing any evidence of amiodarone toxicity.  She has been getting thyroid function studies and liver function studies at her PCP.  She states that she has not had a chest x-ray in several years and we will order one for surveillance for her amiodarone.

## 2012-07-26 NOTE — Assessment & Plan Note (Signed)
The patient has not been expressing any dizziness or syncope.  She has a functioning dual-chamber pacemaker.

## 2012-07-26 NOTE — Patient Instructions (Addendum)
A chest x-ray takes a picture of the organs and structures inside the chest, including the heart, lungs, and blood vessels. This test can show several things, including, whether the heart is enlarges; whether fluid is building up in the lungs; and whether pacemaker / defibrillator leads are still in place. Go to [facility] on [address]/  Across  from [employer].  Your physician wants you to follow-up in: 4 months with ekg  You will receive a reminder letter in the mail two months in advance. If you don't receive a letter, please call our office to schedule the follow-up appointment.

## 2012-07-26 NOTE — Progress Notes (Signed)
[name] Date of Birth:  02/02/1934 [facility] [address] [place], [place]  [address] [phone]  Fax   [phone]  HPI: This pleasant 79 year old woman is seen for a four-month followup office visit. She has been feeling well in regard to her heart. She has had occasional brief episodes of palpitations. Since last visit she has been doing well from the cardiac standpoint. She estimates that she has brief runs of paroxysmal atrial fibrillation perhaps once every 2 weeks and the episode might last just a few minutes. Interestingly since she started to take her verapamil in the morning she feels that she has been having less episodes of palpitations.  Since we last saw her the swimming pool at [facility] has been renovated and she has been doing a lot of water aerobics in the pool.  She also does some yoga exercises.  She is feeling more physically fit.  Current Outpatient Prescriptions  Medication Sig Dispense Refill  . amiodarone (PACERONE) 200 MG tablet Take 1/2 tablet daily  45 tablet  3  . Ascorbic Acid (VITAMIN C) 1000 MG tablet Take 1,000 mg by mouth daily.        Marland Kitchen aspirin 81 MG tablet Take 81 mg by mouth daily.        . Bromfenac Sodium (BROMDAY OP) Apply to eye.        . calcium carbonate 200 MG capsule Take 600 mg by mouth daily.       . cholecalciferol (VITAMIN D) 1000 UNITS tablet Take 1,000 Units by mouth daily.        . clobetasol (OLUX) 0.05 % topical foam       . fish oil-omega-3 fatty acids 1000 MG capsule Take 1 g by mouth.        . levothyroxine (SYNTHROID, LEVOTHROID) 50 MCG tablet Take 50 mcg by mouth daily. po and 75 mcg po alternate days       . metoprolol succinate (TOPROL-XL) 50 MG 24 hr tablet Take 1 tablet (50 mg total) by mouth daily.  90 tablet  3  . Multiple Vitamin (MULTIVITAMIN) capsule Take 1 capsule by mouth daily.        . polyethylene glycol powder (MIRALAX) powder Take by mouth daily. Two capfuls at bedtime      .  Probiotic Product (ALIGN PO) Take by mouth as needed.       . traZODone (DESYREL) 50 MG tablet       . verapamil (CALAN-SR) 240 MG CR tablet Take 1 tablet (240 mg total) by mouth at bedtime.  90 tablet  4  . warfarin (COUMADIN) 5 MG tablet Take 1 tablet (5 mg total) by mouth as directed.  90 tablet  1  . zolpidem (AMBIEN) 10 MG tablet Take 10 mg by mouth at bedtime as needed.         No current facility-administered medications for this visit.    No Known Allergies  Patient Active Problem List   Diagnosis Date Noted  . Atrial fibrillation 05/19/2010    Priority: Medium  . Essential hypertension, benign 04/18/2010    Priority: Medium  . BRADYCARDIA-TACHYCARDIA SYNDROME 12/11/2008    Priority: Medium  . Sciatica of left side 11/22/2011  . Aortic valve insufficiency 03/25/2011  . Dyspnea 11/24/2010  . HYPOTHYROIDISM 04/15/2010  . PALPITATIONS 04/15/2010  . IRRITABLE BOWEL SYNDROME 12/11/2008  . COLONIC POLYPS, ADENOMATOUS, HX OF 12/11/2008  . CONSTIPATION 11/13/2008  . ABDOMINAL BLOATING 11/13/2008  . COAGULOPATHY 07/03/2008  .  CONSTIPATION, CHRONIC 07/03/2008  . CHANGE IN BOWELS 07/03/2008  . UNSPECIFIED DISORDER OF THYROID 07/02/2008  . DIVERTICULOSIS, COLON 07/02/2008    History  Smoking status  . Former Smoker  . Types: Cigarettes  . Quit date: 02/09/1980  Smokeless tobacco  . Never Used    History  Alcohol Use  . 8.4 oz/week  . 14 Glasses of wine per week    Family History  Problem Relation Age of Onset  . Heart failure Mother   . Heart attack Father   . Colon cancer Neg Hx     Review of Systems: The patient denies any heat or cold intolerance.  No weight gain or weight loss.  The patient denies headaches or blurry vision.  There is no cough or sputum production.  The patient denies dizziness.  There is no hematuria or hematochezia.  The patient denies any muscle aches or arthritis.  The patient denies any rash.  The patient denies frequent falling or  instability.  There is no history of depression or anxiety.  All other systems were reviewed and are negative.   Physical Exam: Filed Vitals:   07/26/12 1357  BP: 128/68  Pulse: 64   the general appearance is that of a healthy appearing middle-aged woman in no distress.The head and neck exam reveals pupils equal and reactive.  Extraocular movements are full.  There is no scleral icterus.  The mouth and pharynx are normal.  The neck is supple.  The carotids reveal no bruits.  The jugular venous pressure is normal.  The  thyroid is not enlarged.  There is no lymphadenopathy.  The chest is clear to percussion and auscultation.  There are no rales or rhonchi.  Expansion of the chest is symmetrical.  The precordium is quiet.  The first heart sound is normal.  The second heart sound is physiologically split.  There is no murmur gallop rub or click.  There is no abnormal lift or heave.  The abdomen is soft and nontender.  The bowel sounds are normal.  The liver and spleen are not enlarged.  There are no abdominal masses.  There are no abdominal bruits.  Extremities reveal good pedal pulses.  There is no phlebitis or edema.  There is no cyanosis or clubbing.  Strength is normal and symmetrical in all extremities.  There is no lateralizing weakness.  There are no sensory deficits.  The skin is warm and dry.  There is no rash.  EKG reviewed from 05/03/12 shows atrial paced rhythm with first degree AV block.      Assessment / Plan: The patient is to continue same medication.  She will get a chest x-ray in the next several days.  Recheck in 4 months for followup office visit and EKG

## 2012-08-04 ENCOUNTER — Ambulatory Visit (INDEPENDENT_AMBULATORY_CARE_PROVIDER_SITE_OTHER)
Admission: RE | Admit: 2012-08-04 | Discharge: 2012-08-04 | Disposition: A | Discharge status: Home / Self Care | Payer: Medicare Other | Source: Ambulatory Visit | Attending: Cardiology | Admitting: Cardiology

## 2012-08-04 DIAGNOSIS — Z79899 Other long term (current) drug therapy: Secondary | ICD-10-CM

## 2012-08-07 ENCOUNTER — Telehealth: Payer: Self-pay | Admitting: *Deleted

## 2012-08-07 ENCOUNTER — Ambulatory Visit (INDEPENDENT_AMBULATORY_CARE_PROVIDER_SITE_OTHER): Payer: Medicare Other | Admitting: *Deleted

## 2012-08-07 ENCOUNTER — Encounter: Payer: Self-pay | Admitting: Internal Medicine

## 2012-08-07 DIAGNOSIS — Z950 Presence of cardiac pacemaker: Secondary | ICD-10-CM

## 2012-08-07 DIAGNOSIS — I495 Sick sinus syndrome: Secondary | ICD-10-CM

## 2012-08-07 NOTE — Telephone Encounter (Signed)
Message copied by Burnell Blanks on Mon Aug 07, 2012 12:17 PM ------      Message from: Cassell Clement      Created: Sun Aug 06, 2012  6:22 PM       Chest xray good. Continue same meds. ------

## 2012-08-07 NOTE — Telephone Encounter (Signed)
Advised patient

## 2012-08-08 LAB — REMOTE PACEMAKER DEVICE
BAMS-0001: 170 {beats}/min
RV LEAD AMPLITUDE: 9.7 mv

## 2012-08-14 ENCOUNTER — Ambulatory Visit (INDEPENDENT_AMBULATORY_CARE_PROVIDER_SITE_OTHER): Payer: Medicare Other | Admitting: *Deleted

## 2012-08-14 ENCOUNTER — Telehealth: Payer: Self-pay | Admitting: Internal Medicine

## 2012-08-14 ENCOUNTER — Telehealth: Payer: Self-pay | Admitting: Cardiology

## 2012-08-14 DIAGNOSIS — I4891 Unspecified atrial fibrillation: Secondary | ICD-10-CM

## 2012-08-14 LAB — POCT INR: INR: 2.3

## 2012-08-15 NOTE — Telephone Encounter (Signed)
Spoke w/pt in regards transmission. Transmission was received but in mychart it shows as a no show. Pt was concerned with no show. Advised pt is was lengthy process to get completed and would be taken care of in next week or so. Pt aware and understands.

## 2012-08-15 NOTE — Telephone Encounter (Signed)
New Problem:    Patient called in wanting to speak with you.  Please call back around 10:30am or 3:00pm.

## 2012-08-16 ENCOUNTER — Encounter: Payer: Self-pay | Admitting: *Deleted

## 2012-08-23 NOTE — Telephone Encounter (Signed)
Talked with pt and informed her that green tea does have Vitamin K present and she needs to count it with her green intake for the week and she states understanding.

## 2012-08-23 NOTE — Telephone Encounter (Signed)
New Prob     Pt has a questions about green tea in regards to her coumadin. Please call.

## 2012-08-24 ENCOUNTER — Telehealth: Payer: Self-pay | Admitting: Cardiology

## 2012-08-24 NOTE — Telephone Encounter (Signed)
Having episodes over the last week to week and a half. Is not worse with exercise. Episodes seem to be happening more frequently. Last Saturday it happened on and off all day but usually they are only a few minutes. Is happening daily. Denies pain just feels it beating in her chest. Will discuss with Kathryn Patricia NP in am

## 2012-08-24 NOTE — Telephone Encounter (Signed)
New problem   Pt having tachycardia episodes but is feeling fine right now

## 2012-08-25 NOTE — Telephone Encounter (Signed)
Discussed with Lawson Fiscal NP regarding elevated heart rate/palpitations. Ok to have patient increase her Metoprolol to 75 mg daily, had to leave message. Will call her Tuesday and follow up

## 2012-08-29 NOTE — Telephone Encounter (Signed)
Left message to call back  

## 2012-08-29 NOTE — Telephone Encounter (Signed)
Follow Up      Pt calling following up on call from last week. Please call.

## 2012-08-29 NOTE — Telephone Encounter (Signed)
Spoke with patient and she has not had any episodes since stopping the green tea nor did she make any medication changes. Will call back if any further issues

## 2012-08-29 NOTE — Telephone Encounter (Signed)
Follow up  Pt states that she believes she has discovered what was causing the afib. She started drinking green tea. She said since she has stopped, she has stopped having the problem.

## 2012-09-13 ENCOUNTER — Ambulatory Visit (INDEPENDENT_AMBULATORY_CARE_PROVIDER_SITE_OTHER): Payer: Medicare Other | Admitting: *Deleted

## 2012-09-13 ENCOUNTER — Other Ambulatory Visit: Payer: Self-pay

## 2012-09-13 DIAGNOSIS — I4891 Unspecified atrial fibrillation: Secondary | ICD-10-CM

## 2012-09-13 LAB — POCT INR: INR: 2.9

## 2012-10-11 ENCOUNTER — Ambulatory Visit (INDEPENDENT_AMBULATORY_CARE_PROVIDER_SITE_OTHER): Payer: Medicare Other | Admitting: *Deleted

## 2012-10-11 DIAGNOSIS — I4891 Unspecified atrial fibrillation: Secondary | ICD-10-CM

## 2012-10-11 LAB — POCT INR: INR: 3.3

## 2012-10-23 ENCOUNTER — Other Ambulatory Visit: Payer: Self-pay | Admitting: *Deleted

## 2012-10-23 DIAGNOSIS — I4891 Unspecified atrial fibrillation: Secondary | ICD-10-CM

## 2012-10-23 MED ORDER — WARFARIN SODIUM 5 MG PO TABS: 5.0000 mg | ORAL_TABLET | ORAL | Status: DC

## 2012-11-01 ENCOUNTER — Ambulatory Visit (INDEPENDENT_AMBULATORY_CARE_PROVIDER_SITE_OTHER): Payer: Medicare Other | Admitting: *Deleted

## 2012-11-01 DIAGNOSIS — I4891 Unspecified atrial fibrillation: Secondary | ICD-10-CM

## 2012-11-13 ENCOUNTER — Encounter: Payer: Self-pay | Admitting: Internal Medicine

## 2012-11-13 ENCOUNTER — Telehealth: Payer: Self-pay | Admitting: Internal Medicine

## 2012-11-13 ENCOUNTER — Ambulatory Visit (INDEPENDENT_AMBULATORY_CARE_PROVIDER_SITE_OTHER): Payer: Medicare Other | Admitting: *Deleted

## 2012-11-13 DIAGNOSIS — I495 Sick sinus syndrome: Secondary | ICD-10-CM

## 2012-11-13 NOTE — Telephone Encounter (Signed)
Pt was out of town and had px with constipation. Used an enema but the "liquid would not come out." Pt went to the ER while she was out of town and the dx was fecal impaction. Pt states she is continuing to have problems with constipation. Pt scheduled to see Mike Gip PA tomorrow at 1:30pm. Pt aware of appt.

## 2012-11-14 ENCOUNTER — Encounter: Payer: Self-pay | Admitting: Physician Assistant

## 2012-11-14 ENCOUNTER — Ambulatory Visit (INDEPENDENT_AMBULATORY_CARE_PROVIDER_SITE_OTHER): Payer: Medicare Other | Admitting: Physician Assistant

## 2012-11-14 VITALS — BP 100/60 | HR 68 | Ht 61.0 in | Wt 118.4 lb

## 2012-11-14 DIAGNOSIS — K5900 Constipation, unspecified: Secondary | ICD-10-CM

## 2012-11-14 DIAGNOSIS — R140 Abdominal distension (gaseous): Secondary | ICD-10-CM

## 2012-11-14 DIAGNOSIS — R141 Gas pain: Secondary | ICD-10-CM

## 2012-11-14 MED ORDER — RIFAXIMIN 550 MG PO TABS: 550.0000 mg | ORAL_TABLET | Freq: Two times a day (BID) | ORAL | Status: DC

## 2012-11-14 NOTE — Progress Notes (Signed)
Subjective:    Patient ID: Kathryn Gibson, female    DOB: [birth year], 79 y.o.   MRN: [id number]  HPI Kathryn Gibson is a very nice 79 year old female known to Dr. Marina Goodell who has history of IBS and chronic constipation colon polyps and diverticulosis.  She also has a tachybradycardia syndrome and is status post pacemaker and history of atrial fibrillation for which she is on Coumadin. Last colonoscopy was done in May of 2013, she had 3 polyps removed one of which was a sessile serrated adenoma with high-grade dysplasia and was consistent with a depressed introverted adenoma. She is to have followup at a 3 year interval. Patient has been on chronic MiraLax. She comes in today after an episode of severe constipation while she was out of town visiting in [place]. She says she had been doing well with her bowels using 2 doses of MiraLax daily. She got off of her schedule just for a few days but says she was still having regular bowel movements but developed lower abdominal pain and pressure urgency in her rectum and also felt that she was having difficulty evacuating her bladder. She went to emergency room [facility] and had workup with CT scan of the abdomen and pelvis. We have obtained a copy of that report and this was done without any contrast. She was noted to have fecal retention with stool seen in the colon but no other abnormalities other than diverticulosis. She says she did not receive an enema but did have some sort of attempts at disimpaction. Her labs are reviewed and were unremarkable with the exception of a WBC of 14,000. She says she has been better since returning home and has continued to have bowel movements but is concerned whether she still has a high impaction. She says she's also been having increased difficulty with bloating and gas as well as belching. Says bloating and gas of been a problem long-term but are worse recently. She does not use any artificial sweeteners and does not drink any carbonated  beverages.    Review of Systems  Constitutional: Negative.   HENT: Negative.   Eyes: Negative.   Respiratory: Negative.   Cardiovascular: Negative.   Gastrointestinal: Positive for constipation.  Endocrine: Negative.   Genitourinary: Negative.   Musculoskeletal: Negative.   Skin: Negative.   Allergic/Immunologic: Negative.   Neurological: Negative.   Hematological: Negative.   Psychiatric/Behavioral: Negative.    Outpatient Prescriptions Prior to Visit  Medication Sig Dispense Refill  . amiodarone (PACERONE) 200 MG tablet Take 1/2 tablet daily  45 tablet  3  . Ascorbic Acid (VITAMIN C) 1000 MG tablet Take 1,000 mg by mouth daily.        Marland Kitchen aspirin 81 MG tablet Take 81 mg by mouth daily.        . Bromfenac Sodium (BROMDAY OP) Apply to eye.        . calcium carbonate 200 MG capsule Take 600 mg by mouth daily.       . cholecalciferol (VITAMIN D) 1000 UNITS tablet Take 1,000 Units by mouth daily.        . clobetasol (OLUX) 0.05 % topical foam       . fish oil-omega-3 fatty acids 1000 MG capsule Take 1 g by mouth.        . levothyroxine (SYNTHROID, LEVOTHROID) 50 MCG tablet Take 50 mcg by mouth daily. po and 75 mcg po alternate days       . metoprolol succinate (TOPROL-XL) 50 MG 24 hr  tablet Take 1 tablet (50 mg total) by mouth daily.  90 tablet  3  . Multiple Vitamin (MULTIVITAMIN) capsule Take 1 capsule by mouth daily.        . polyethylene glycol powder (MIRALAX) powder Take by mouth daily. Two capfuls at bedtime      . Probiotic Product (ALIGN PO) Take by mouth as needed.       . verapamil (CALAN-SR) 240 MG CR tablet Take 1 tablet (240 mg total) by mouth at bedtime.  90 tablet  4  . warfarin (COUMADIN) 5 MG tablet Take 1 tablet (5 mg total) by mouth as directed.  100 tablet  1  . zolpidem (AMBIEN) 10 MG tablet Take 10 mg by mouth at bedtime as needed.        . traZODone (DESYREL) 50 MG tablet        No facility-administered medications prior to visit.   No Known  Allergies Patient Active Problem List   Diagnosis Date Noted  . Sciatica of left side 11/22/2011  . Aortic valve insufficiency 03/25/2011  . Dyspnea 11/24/2010  . Atrial fibrillation 05/19/2010  . Essential hypertension, benign 04/18/2010  . HYPOTHYROIDISM 04/15/2010  . PALPITATIONS 04/15/2010  . BRADYCARDIA-TACHYCARDIA SYNDROME 12/11/2008  . IRRITABLE BOWEL SYNDROME 12/11/2008  . COLONIC POLYPS, ADENOMATOUS, HX OF 12/11/2008  . CONSTIPATION 11/13/2008  . ABDOMINAL BLOATING 11/13/2008  . COAGULOPATHY 07/03/2008  . CONSTIPATION, CHRONIC 07/03/2008  . CHANGE IN BOWELS 07/03/2008  . UNSPECIFIED DISORDER OF THYROID 07/02/2008  . DIVERTICULOSIS, COLON 07/02/2008   History  Substance Use Topics  . Smoking status: Former Smoker    Types: Cigarettes    Quit date: 02/09/1980  . Smokeless tobacco: Never Used  . Alcohol Use: 8.4 oz/week    14 Glasses of wine per week   Patient Active Problem List   Diagnosis Date Noted  . Sciatica of left side 11/22/2011  . Aortic valve insufficiency 03/25/2011  . Dyspnea 11/24/2010  . Atrial fibrillation 05/19/2010  . Essential hypertension, benign 04/18/2010  . HYPOTHYROIDISM 04/15/2010  . PALPITATIONS 04/15/2010  . BRADYCARDIA-TACHYCARDIA SYNDROME 12/11/2008  . IRRITABLE BOWEL SYNDROME 12/11/2008  . COLONIC POLYPS, ADENOMATOUS, HX OF 12/11/2008  . CONSTIPATION 11/13/2008  . ABDOMINAL BLOATING 11/13/2008  . COAGULOPATHY 07/03/2008  . CONSTIPATION, CHRONIC 07/03/2008  . CHANGE IN BOWELS 07/03/2008  . UNSPECIFIED DISORDER OF THYROID 07/02/2008  . DIVERTICULOSIS, COLON 07/02/2008       Objective:   Physical Exam  elderly white female in no acute distress, pleasant blood pressure 100/60 pulse 68 height 5 foot 1 weight 118. HEENT ;nontraumatic normocephalic EOMI PERRLA sclera anicteric, Supple; no JVD, Cardiovascular ;regular rate and rhythm with S1-S2 no murmur or gallop, pacemaker in the right chest wall Pulmonary; clear bilaterally, Abdomen;  soft, nondistended bowel sounds are active there is no palpable mass or hepatosplenomegaly she has  minimal tenderness bilaterally in the lower quadrants, Rectal; exam noninflamed external hemorrhoids there is no stool in the rectal vault, no fecal impaction, Extremities; no clubbing cyanosis or edema skin warm and dry, Psych; mood and affect normal and appropriate        Assessment & Plan:  #6  79 year old female with IBS and chronic constipation with recent exacerbation. I am not clear that she actually had a fecal impaction by the CT report and wonder if she had a mild episode of diverticulitis or other cause for discomfort and leukocytosis nevertheless she is better. #2 chronic bloating and gas-this may in part be side effect of double dose  MiraLax, will treat for possible bacterial overgrowth #3 history of adenomatous polyps with a sessile serrated adenoma with high-grade dysplasia May 2013 due for 3 year interval followup #4 status post pacemaker history of tachybradycardia syndrome #5 atrial fibrillation on chronic Coumadin  Plan; patient will continue MiraLax either one dose every day or 2 doses every other day. Have asked her to purge her bowel with 6 doses of MiraLax in a one-day period-then go back to her usual dosing. Continue probiotics She is given a course of Xifaxan 550 twice daily for 7 days to treat for possible bacterial overgrowth and have asked her to call after she completes the course with an update. She will followup with Dr. Marina Goodell in the office as needed and again will need followup colonoscopy in 2016

## 2012-11-14 NOTE — Patient Instructions (Addendum)
On the day of your choosing, take 6 doses of Miralax  ( all in one day ).  Stay close to the bathroom until finished.  We have given you samples of Xifaxan to take, 2 tablets daily for 2 days.  Then the prescription at [facility] is 2 tablets daily for 5 days, ( 10 ).    Call us back after you finish the antibiotic with a progress report.

## 2012-11-14 NOTE — Progress Notes (Signed)
Agree with initial assessment and plans 

## 2012-11-15 ENCOUNTER — Ambulatory Visit (INDEPENDENT_AMBULATORY_CARE_PROVIDER_SITE_OTHER): Payer: Medicare Other | Admitting: *Deleted

## 2012-11-15 DIAGNOSIS — I4891 Unspecified atrial fibrillation: Secondary | ICD-10-CM

## 2012-11-17 LAB — REMOTE PACEMAKER DEVICE
AL IMPEDENCE PM: 528 Ohm
ATRIAL PACING PM: 97.58
BAMS-0001: 170 {beats}/min
BATTERY VOLTAGE: 3 V

## 2012-11-22 ENCOUNTER — Ambulatory Visit: Payer: Medicare Other | Admitting: Internal Medicine

## 2012-11-24 ENCOUNTER — Ambulatory Visit (INDEPENDENT_AMBULATORY_CARE_PROVIDER_SITE_OTHER): Payer: Medicare Other | Admitting: *Deleted

## 2012-11-24 ENCOUNTER — Encounter: Payer: Self-pay | Admitting: Cardiology

## 2012-11-24 ENCOUNTER — Ambulatory Visit (INDEPENDENT_AMBULATORY_CARE_PROVIDER_SITE_OTHER): Payer: Medicare Other | Admitting: Cardiology

## 2012-11-24 VITALS — BP 130/70 | HR 63 | Ht 61.0 in | Wt 118.8 lb

## 2012-11-24 DIAGNOSIS — I4891 Unspecified atrial fibrillation: Secondary | ICD-10-CM

## 2012-11-24 DIAGNOSIS — I351 Nonrheumatic aortic (valve) insufficiency: Secondary | ICD-10-CM

## 2012-11-24 DIAGNOSIS — I359 Nonrheumatic aortic valve disorder, unspecified: Secondary | ICD-10-CM

## 2012-11-24 DIAGNOSIS — I480 Paroxysmal atrial fibrillation: Secondary | ICD-10-CM

## 2012-11-24 DIAGNOSIS — I119 Hypertensive heart disease without heart failure: Secondary | ICD-10-CM

## 2012-11-24 DIAGNOSIS — I10 Essential (primary) hypertension: Secondary | ICD-10-CM

## 2012-11-24 NOTE — Assessment & Plan Note (Signed)
She is not having any symptoms referable to her mild valvular heart disease.

## 2012-11-24 NOTE — Progress Notes (Signed)
Kathryn Gibson Date of Birth:  10-18-1933 [address] [place], [place]  [address] [phone]  Fax   [phone]  HPI: This pleasant 79 year old woman is seen for a four-month followup office visit.  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  She has a functioning dual-chamber pacemaker.  Since last visit she has had very few palpitations.  For a while she was drinking some green tea and noticed that the palpitations have increased in frequency.  Since stopping the green tea, palpitations have resolved.  The patient has been having a lot of GI issues since I last saw her.  She was seen in the emergency room in [facility] for partial intestinal blockage and did not require surgery.  Since returning to [facility] she has seen Dr. Marina Goodell, her gastroenterologist.  She has a lot of problems with intestinal gas.  She wonders if she is lactose intolerant.  She does avoid gluten as much as possible.  Current Outpatient Prescriptions  Medication Sig Dispense Refill  . amiodarone (PACERONE) 200 MG tablet Take 1/2 tablet daily  45 tablet  3  . Ascorbic Acid (VITAMIN C) 1000 MG tablet Take 1,000 mg by mouth daily.        Marland Kitchen aspirin 81 MG tablet Take 81 mg by mouth daily.        . Bromfenac Sodium (BROMDAY OP) Apply to eye.        . calcium carbonate 200 MG capsule Take 600 mg by mouth daily.       . cholecalciferol (VITAMIN D) 1000 UNITS tablet Take 1,000 Units by mouth daily.        . clobetasol (OLUX) 0.05 % topical foam       . fish oil-omega-3 fatty acids 1000 MG capsule Take 1 g by mouth.        . levothyroxine (SYNTHROID, LEVOTHROID) 50 MCG tablet Take 50 mcg by mouth daily. po and 75 mcg po alternate days       . metoprolol succinate (TOPROL-XL) 50 MG 24 hr tablet Take 1 tablet (50 mg total) by mouth daily.  90 tablet  3  . Multiple Vitamin (MULTIVITAMIN) capsule Take 1 capsule by mouth daily.        . polyethylene  glycol powder (MIRALAX) powder Take by mouth daily. Two capfuls at bedtime      . Probiotic Product (ALIGN PO) Take by mouth as needed.       . rifaximin (XIFAXAN) 550 MG TABS tablet Take 1 tablet (550 mg total) by mouth 2 (two) times daily.  10 tablet  0  . verapamil (CALAN-SR) 240 MG CR tablet Take 1 tablet (240 mg total) by mouth at bedtime.  90 tablet  4  . warfarin (COUMADIN) 5 MG tablet Take 1 tablet (5 mg total) by mouth as directed.  100 tablet  1  . zolpidem (AMBIEN) 10 MG tablet Take 10 mg by mouth at bedtime as needed.         No current facility-administered medications for this visit.    No Known Allergies  Patient Active Problem List   Diagnosis Date Noted  . Atrial fibrillation 05/19/2010    Priority: Medium  . Essential hypertension, benign 04/18/2010    Priority: Medium  . BRADYCARDIA-TACHYCARDIA SYNDROME 12/11/2008    Priority: Medium  . Sciatica of left side 11/22/2011  . Aortic valve insufficiency 03/25/2011  . Dyspnea 11/24/2010  . HYPOTHYROIDISM 04/15/2010  . PALPITATIONS  04/15/2010  . IRRITABLE BOWEL SYNDROME 12/11/2008  . COLONIC POLYPS, ADENOMATOUS, HX OF 12/11/2008  . CONSTIPATION 11/13/2008  . ABDOMINAL BLOATING 11/13/2008  . COAGULOPATHY 07/03/2008  . CONSTIPATION, CHRONIC 07/03/2008  . CHANGE IN BOWELS 07/03/2008  . UNSPECIFIED DISORDER OF THYROID 07/02/2008  . DIVERTICULOSIS, COLON 07/02/2008    History  Smoking status  . Former Smoker  . Types: Cigarettes  . Quit date: 02/09/1980  Smokeless tobacco  . Never Used    History  Alcohol Use  . 8.4 oz/week  . 14 Glasses of wine per week    Family History  Problem Relation Age of Onset  . Heart failure Mother   . Heart attack Father   . Colon cancer Neg Hx     Review of Systems: The patient denies any heat or cold intolerance.  No weight gain or weight loss.  The patient denies headaches or blurry vision.  There is no cough or sputum production.  The patient denies dizziness.  There  is no hematuria or hematochezia.  The patient denies any muscle aches or arthritis.  The patient denies any rash.  The patient denies frequent falling or instability.  There is no history of depression or anxiety.  All other systems were reviewed and are negative.   Physical Exam: Filed Vitals:   11/24/12 1327  BP: 130/70  Pulse: 63   the general appearance is that of a healthy appearing middle-aged woman in no distress.The head and neck exam reveals pupils equal and reactive.  Extraocular movements are full.  There is no scleral icterus.  The mouth and pharynx are normal.  The neck is supple.  The carotids reveal no bruits.  The jugular venous pressure is normal.  The  thyroid is not enlarged.  There is no lymphadenopathy.  The chest is clear to percussion and auscultation.  There are no rales or rhonchi.  Expansion of the chest is symmetrical.  The precordium is quiet.  The first heart sound is normal.  The second heart sound is physiologically split.  There is no murmur gallop rub or click.  There is no abnormal lift or heave.  The abdomen is soft and nontender.  The bowel sounds are normal.  The liver and spleen are not enlarged.  There are no abdominal masses.  There are no abdominal bruits.  Extremities reveal good pedal pulses.  There is no phlebitis or edema.  There is no cyanosis or clubbing.  Strength is normal and symmetrical in all extremities.  There is no lateralizing weakness.  There are no sensory deficits.  The skin is warm and dry.  There is no rash.  EKG today shows atrial pacemaker with normal QRS complex.  She has mild first degree AV block      Assessment / Plan: The patient is to continue same medication.  Recheck in 4 months for followup office visit.  Continue regular exercise.  She does-year-old and pool exercises as well as cardio fitness class

## 2012-11-24 NOTE — Assessment & Plan Note (Signed)
She continues to do well on long-term Coumadin.  She has not had any TIA or stroke symptoms.  Her frequency of paroxysmal atrial fibrillation he has decreased with avoidance of caffeine.

## 2012-11-24 NOTE — Assessment & Plan Note (Signed)
Her blood pressure was remaining stable on current therapy 

## 2012-11-24 NOTE — Patient Instructions (Signed)
Your physician recommends that you continue on your current medications as directed. Please refer to the Current Medication list given to you today.  Your physician wants you to follow-up in: 4 month ov You will receive a reminder letter in the mail two months in advance. If you don't receive a letter, please call our office to schedule the follow-up appointment.  

## 2012-11-27 ENCOUNTER — Telehealth: Payer: Self-pay | Admitting: Internal Medicine

## 2012-11-27 NOTE — Telephone Encounter (Signed)
Patient calling back after completing Xifaxan. States she is still burping after she eats and bloating after she eats. States she is not seeing any difference after taking the antibiotics.

## 2012-11-29 NOTE — Telephone Encounter (Signed)
Line busy, will try again later

## 2012-11-29 NOTE — Telephone Encounter (Signed)
Left a message for patient to call me. 

## 2012-11-29 NOTE — Telephone Encounter (Signed)
Ok- was not sure it would help but wanted to treat for Bact overgrowth to see if would help-would ask her to  Try one gas x or one phazyme  Before  each meal

## 2012-11-30 NOTE — Telephone Encounter (Signed)
Patient given recommendations. 

## 2012-12-04 ENCOUNTER — Encounter: Payer: Self-pay | Admitting: Internal Medicine

## 2012-12-11 ENCOUNTER — Telehealth: Payer: Self-pay | Admitting: Internal Medicine

## 2012-12-11 NOTE — Telephone Encounter (Signed)
Pt states her stomach has been hurting and she cannot eat much. C/O lots of gas and bloating and losing weight. Pts appt moved to Wednesday at 3:15pm. Pt aware of new appt date and time.

## 2012-12-11 NOTE — Telephone Encounter (Signed)
Left message for pt to call back  °

## 2012-12-13 ENCOUNTER — Ambulatory Visit (INDEPENDENT_AMBULATORY_CARE_PROVIDER_SITE_OTHER): Payer: Medicare Other | Admitting: Internal Medicine

## 2012-12-13 ENCOUNTER — Encounter: Payer: Self-pay | Admitting: Internal Medicine

## 2012-12-13 VITALS — BP 132/66 | HR 70 | Ht 61.0 in | Wt 117.0 lb

## 2012-12-13 DIAGNOSIS — Z86010 Personal history of colonic polyps: Secondary | ICD-10-CM

## 2012-12-13 DIAGNOSIS — R141 Gas pain: Secondary | ICD-10-CM

## 2012-12-13 DIAGNOSIS — K589 Irritable bowel syndrome without diarrhea: Secondary | ICD-10-CM

## 2012-12-13 DIAGNOSIS — K5900 Constipation, unspecified: Secondary | ICD-10-CM

## 2012-12-13 MED ORDER — LINACLOTIDE 145 MCG PO CAPS: 145.0000 ug | ORAL_CAPSULE | Freq: Every day | ORAL | Status: DC

## 2012-12-13 NOTE — Progress Notes (Signed)
HISTORY OF PRESENT ILLNESS:  Kathryn Gibson is a 79 y.o. female with multiple medical problems including hypertension, hypothyroidism, tachybradycardia syndrome status post pacemaker placement, atrial fibrillation on chronic Coumadin therapy, chronic abdominal complaints consistent with constipation predominant irritable bowel syndrome, and adenomatous colon polyps. She has been seen in number of occasions for chronic abdominal complaints. I last saw the patient in March 2013. She was seen by our extender 11/14/2012 regarding chronic constipation with recent exacerbation question of fecal impaction. As well chronic bloating with gas. She was continued on MiraLax. As well probiotics. Prescribe Xifaxan for 7 days. Followup at this time. Patient reports that she can move her bowels with MiraLax though occasionally 2 loose. Still with burping. This disturbs her socially. She takes beano with some relief. Also takes antacids. No abdominal pain. She inquired about when she is due for colonoscopy again. Her last colonoscopy May 2013 with 3 adenomas. Followup in 3 years recommended. She also has moderate diverticulosis.  REVIEW OF SYSTEMS:  All non-GI ROS negative upon review  Past Medical History  Diagnosis Date  . Atrial fibrillation   . Hypertension   . Hypothyroidism   . Irritable bowel syndrome (IBS)   . Constipation   . Colonic polyp   . Diverticulosis   . Bradycardia     s/p PPM  . Hemorrhoids   . Arthritis   . Cataract     Past Surgical History  Procedure Laterality Date  . Pacemaker insertion  2010    implanted by Dr Deborah Chalk ([facility])  . Colonoscopy      Social History [name]  reports that she quit smoking about 32 years ago. Her smoking use included Cigarettes. She smoked 0.00 packs per day. She has never used smokeless tobacco. She reports that she drinks about 8.4 ounces of alcohol per week. She reports that she does not use illicit drugs.  family history includes Heart  attack in her father; Heart failure in her mother. There is no history of Colon cancer.  No Known Allergies     PHYSICAL EXAMINATION: Vital signs: BP 132/66  Pulse 70  Ht 5\' 1"  (1.549 m)  Wt 117 lb (53.071 kg)  BMI 22.12 kg/m2 General: Well-developed, well-nourished, no acute distress HEENT: Sclerae are anicteric, conjunctiva pink. Oral mucosa intact Lungs: Clear Heart: Regular Abdomen: soft, nontender, nondistended, no obvious ascites, no peritoneal signs, normal bowel sounds. No organomegaly. Extremities: No edema Psychiatric: alert and oriented x3. Cooperative     ASSESSMENT:  #1. Constipation predominant IBS #2. Gas and bloating #3. History of adenomatous polyps   PLAN:  #1. Trial of Linzess 145 mcg daily. 1 month of samples provided #2. Discussion on intestinal gas. Discussed the role of diet, probiotic, etc. #3. Surveillance colonoscopy due around May 2016 #4. Followup as needed. If Linzess helpful, we may prescribe this for her. She will contact the office with an update

## 2012-12-13 NOTE — Patient Instructions (Signed)
We have given you some samples of Linzess - take one capsule 30 minutes before your first meal of the day.  If these work well for you, we will call you in a prescription

## 2012-12-14 ENCOUNTER — Other Ambulatory Visit: Payer: Self-pay

## 2012-12-22 ENCOUNTER — Ambulatory Visit (INDEPENDENT_AMBULATORY_CARE_PROVIDER_SITE_OTHER): Payer: Medicare Other | Admitting: *Deleted

## 2012-12-22 DIAGNOSIS — I4891 Unspecified atrial fibrillation: Secondary | ICD-10-CM

## 2012-12-27 ENCOUNTER — Ambulatory Visit: Payer: Medicare Other | Admitting: Internal Medicine

## 2013-01-10 ENCOUNTER — Ambulatory Visit (INDEPENDENT_AMBULATORY_CARE_PROVIDER_SITE_OTHER): Payer: Medicare Other | Admitting: Pharmacist

## 2013-01-10 DIAGNOSIS — I4891 Unspecified atrial fibrillation: Secondary | ICD-10-CM

## 2013-01-10 LAB — POCT INR: INR: 2.5

## 2013-02-09 ENCOUNTER — Ambulatory Visit (INDEPENDENT_AMBULATORY_CARE_PROVIDER_SITE_OTHER): Payer: Medicare Other | Admitting: *Deleted

## 2013-02-09 DIAGNOSIS — I4891 Unspecified atrial fibrillation: Secondary | ICD-10-CM

## 2013-02-09 LAB — POCT INR: INR: 2.2

## 2013-02-16 ENCOUNTER — Encounter: Payer: Medicare Other | Admitting: *Deleted

## 2013-02-16 DIAGNOSIS — I495 Sick sinus syndrome: Secondary | ICD-10-CM

## 2013-02-20 ENCOUNTER — Telehealth: Payer: Self-pay | Admitting: Internal Medicine

## 2013-02-20 ENCOUNTER — Encounter: Payer: Self-pay | Admitting: Internal Medicine

## 2013-02-20 NOTE — Telephone Encounter (Signed)
New message     Pt want device clinic to know that she did send in her remote transmission on the 9th,,,

## 2013-02-20 NOTE — Telephone Encounter (Signed)
Pt alerted by MyChart about a missed transmission. Transmission was received on 02/16/13.

## 2013-02-23 ENCOUNTER — Telehealth: Payer: Self-pay | Admitting: Cardiology

## 2013-02-23 ENCOUNTER — Telehealth: Payer: Self-pay | Admitting: Internal Medicine

## 2013-02-23 NOTE — Telephone Encounter (Signed)
Spoke with pt.  She missed 1 dose on Monday and took an extra 1/2 tablet on Tuesday.  Wanted to know if there was anything else she should do.  Explained to pt to continue with previous dose and we will follow up as previously scheduled.

## 2013-02-23 NOTE — Telephone Encounter (Signed)
New problem    Pt called for some advice she missed a dose and would like a call back.  Pt will back out by 10:30 am  back by 1:30pm.

## 2013-02-23 NOTE — Telephone Encounter (Signed)
Pt states she is having problems with constipation and she is going out of town next week, requests to be seen. Pt scheduled to see Amy Esterwood PA 02/26/13@2 :30pm. Pt aware of appt date and time.

## 2013-02-26 ENCOUNTER — Other Ambulatory Visit: Payer: Self-pay

## 2013-02-26 ENCOUNTER — Encounter: Payer: Self-pay | Admitting: Physician Assistant

## 2013-02-26 ENCOUNTER — Ambulatory Visit (INDEPENDENT_AMBULATORY_CARE_PROVIDER_SITE_OTHER): Payer: Medicare Other | Admitting: Physician Assistant

## 2013-02-26 VITALS — BP 106/52 | HR 64 | Ht 61.0 in | Wt 118.4 lb

## 2013-02-26 DIAGNOSIS — R143 Flatulence: Secondary | ICD-10-CM

## 2013-02-26 DIAGNOSIS — R140 Abdominal distension (gaseous): Secondary | ICD-10-CM

## 2013-02-26 DIAGNOSIS — R141 Gas pain: Secondary | ICD-10-CM

## 2013-02-26 DIAGNOSIS — R142 Eructation: Secondary | ICD-10-CM

## 2013-02-26 DIAGNOSIS — Z1231 Encounter for screening mammogram for malignant neoplasm of breast: Secondary | ICD-10-CM

## 2013-02-26 DIAGNOSIS — K5900 Constipation, unspecified: Secondary | ICD-10-CM

## 2013-02-26 DIAGNOSIS — K5909 Other constipation: Secondary | ICD-10-CM

## 2013-02-26 MED ORDER — LINACLOTIDE 290 MCG PO CAPS: 290.0000 ug | ORAL_CAPSULE | Freq: Every day | ORAL | Status: DC

## 2013-02-26 NOTE — Patient Instructions (Signed)
We have given you samples of Linzess 290 mcg. Take 1 tab daily. Continue the Miralax 17 gram in 8 oz of juice or water. We have given you a Gas prevention diet brochure.  Call with a progress report when you return from your trip.

## 2013-02-26 NOTE — Progress Notes (Signed)
Subjective:    Patient ID: Kathryn Gibson, female    DOB: 03-06-1933, 80 y.o.   MRN: [id number]  HPI  Kathryn Gibson is a pleasant 80 year old female known to Dr. Henrene Pastor. She has history of chronic constipation and diverticular disease as well as IV S. and adenomatous colon polyps. She is status post pacemaker placement for tachybradycardia syndrome and has history of atrial fib for which she is maintained on Coumadin. She is been seen a couple of times this past year with complaints of constipation and abdominal bloating. Her last colonoscopy was in May of 2013 she had 3 polyps removed at that time which were all adenomatous - one  with high-grade dysplasia and she is set for 3 year interval followup. She is maintained on 16 ounces of MiraLax daily. She had been given a trial of Xifaxan several months ago for potential bacterial overgrowth and says she did not notice any difference in her symptoms after taking the Xifaxan. Should also been given a trial of Linzess, November of 2014 after office visit with Dr. Henrene Pastor. She says she took the samples which were the 145 mcg dosage. She did not feel that that offered much benefit over the MiraLax. She  comes in today now concerned because she is going out of town on vacation and has been quite constipated again. She says her stools are somewhat narrow and piecy She also has a lot of belching and burping and says she feels full fluid most of the time. She has been drinking a glass of hot water with lemon every morning and has tried prune juice over the past couple of days. This is in addition to 16 ounces of MiraLax every night. Usually she will have some bowel movement each day but does not feel that she evacuates her bowels. She says this morning she did have a very good bowel movement and actually feels much better. She has also recently added beano - as needed basis especially when going out to eat.    Review of Systems  Constitutional: Negative.   HENT: Negative.    Eyes: Negative.   Respiratory: Negative.   Cardiovascular: Negative.   Gastrointestinal: Positive for constipation and abdominal distention.  Endocrine: Negative.   Genitourinary: Negative.   Musculoskeletal: Positive for back pain.  Allergic/Immunologic: Negative.   Neurological: Negative.   Hematological: Negative.   Psychiatric/Behavioral: Negative.    Outpatient Prescriptions Prior to Visit  Medication Sig Dispense Refill  . amiodarone (PACERONE) 200 MG tablet Take 1/2 tablet daily  45 tablet  3  . Ascorbic Acid (VITAMIN C) 1000 MG tablet Take 1,000 mg by mouth daily.        Marland Kitchen aspirin 81 MG tablet Take 81 mg by mouth daily.        . Bromfenac Sodium (BROMDAY OP) Apply to eye.        . calcium carbonate 200 MG capsule Take 600 mg by mouth daily.       . cholecalciferol (VITAMIN D) 1000 UNITS tablet Take 1,000 Units by mouth daily.        . clobetasol (OLUX) 0.05 % topical foam Use as needed      . fish oil-omega-3 fatty acids 1000 MG capsule Take 1 g by mouth.        . levothyroxine (SYNTHROID, LEVOTHROID) 50 MCG tablet Take 50 mcg by mouth daily. 20mcg po and 75 mcg po alternate days       . metoprolol succinate (TOPROL-XL) 50 MG 24 hr  tablet Take 1 tablet (50 mg total) by mouth daily.  90 tablet  3  . Multiple Vitamin (MULTIVITAMIN) capsule Take 1 capsule by mouth daily.        . polyethylene glycol powder (MIRALAX) powder Take by mouth daily. Two capfuls at bedtime      . Probiotic Product (ALIGN PO) Take by mouth as needed.       . verapamil (CALAN-SR) 240 MG CR tablet Take 1 tablet (240 mg total) by mouth at bedtime.  90 tablet  4  . warfarin (COUMADIN) 5 MG tablet Take 1 tablet (5 mg total) by mouth as directed.  100 tablet  1  . zolpidem (AMBIEN) 10 MG tablet Take 10 mg by mouth at bedtime as needed.        . rifaximin (XIFAXAN) 550 MG TABS tablet Take 1 tablet (550 mg total) by mouth 2 (two) times daily.  10 tablet  0   No facility-administered medications prior to  visit.   No Known Allergies Patient Active Problem List   Diagnosis Date Noted  . Sciatica of left side 11/22/2011  . Aortic valve insufficiency 03/25/2011  . Dyspnea 11/24/2010  . Atrial fibrillation 05/19/2010  . Essential hypertension, benign 04/18/2010  . HYPOTHYROIDISM 04/15/2010  . PALPITATIONS 04/15/2010  . BRADYCARDIA-TACHYCARDIA SYNDROME 12/11/2008  . IRRITABLE BOWEL SYNDROME 12/11/2008  . COLONIC POLYPS, ADENOMATOUS, HX OF 12/11/2008  . CONSTIPATION 11/13/2008  . ABDOMINAL BLOATING 11/13/2008  . COAGULOPATHY 07/03/2008  . CONSTIPATION, CHRONIC 07/03/2008  . CHANGE IN BOWELS 07/03/2008  . UNSPECIFIED DISORDER OF THYROID 07/02/2008  . DIVERTICULOSIS, COLON 07/02/2008   History  Substance Use Topics  . Smoking status: Former Smoker    Types: Cigarettes    Quit date: 02/09/1980  . Smokeless tobacco: Never Used  . Alcohol Use: 8.4 oz/week    14 Glasses of wine per week   family history includes Diabetes in her paternal grandfather; Heart attack in her father; Heart disease in her brother; Heart failure in her mother; Lung cancer in her son. There is no history of Colon cancer, Kidney disease, Liver disease, or Esophageal cancer.     Objective:   Physical Exam  well-developed elderly white female in no acute distress, pleasant blood pressure 106/52 pulse 64 height 5 foot 1 weight 118. HEENT ;nontraumatic normocephalic EOMI PERRLA sclera anicteric, Supple ;no JVD, Cardiovascular; regular rate and rhythm with S1-S2 no murmur or gallop, Pulmonary; clear bilaterally, Abdomen; soft no focal tenderness no guarding or rebound no palpable mass or hepatosplenomegaly bowel sounds are present, Rectal; exam not done, Extremities; no clubbing cyanosis or edema skin warm and dry, Psych ;mood and affect appropriate        Assessment & Plan:  #74 80 year old female with IBS and chronic constipation. Patient with chronic complaints of bloating and gas. Previous attempts at treating  bacterial overgrowth unhelpful. #2 adenomatous colon polyps due for followup colonoscopy May 2016 #3 diverticulosis #4 tachybradycardia syndrome status post pacemaker #5 chronic atrial fibrillation on Coumadin   Plan; Low gas diet Have offered a trial of higher dose Linzess 290 mcg once daily-and she was given a couple of weeks worth of samples. Initially she will continue 16 ounces of MiraLax every day and if she finds the Linzess helpful can back off to 8 ounces of MiraLax once daily Continue per induced daily Beano or Phazyme when necessary for gas Continue daily probiotic

## 2013-02-26 NOTE — Progress Notes (Signed)
Agree with assessment and plans 

## 2013-03-05 ENCOUNTER — Encounter: Payer: Self-pay | Admitting: *Deleted

## 2013-03-06 ENCOUNTER — Encounter: Payer: Self-pay | Admitting: Internal Medicine

## 2013-03-07 ENCOUNTER — Encounter: Payer: Self-pay | Admitting: Internal Medicine

## 2013-03-09 ENCOUNTER — Ambulatory Visit (INDEPENDENT_AMBULATORY_CARE_PROVIDER_SITE_OTHER): Payer: Medicare Other | Admitting: Pharmacist

## 2013-03-09 DIAGNOSIS — I4891 Unspecified atrial fibrillation: Secondary | ICD-10-CM

## 2013-03-09 LAB — POCT INR: INR: 2.9

## 2013-03-12 ENCOUNTER — Other Ambulatory Visit: Payer: Self-pay

## 2013-03-12 MED ORDER — AMIODARONE HCL 200 MG PO TABS: ORAL_TABLET | ORAL | Status: DC

## 2013-03-12 MED ORDER — METOPROLOL SUCCINATE ER 50 MG PO TB24: 50.0000 mg | ORAL_TABLET | Freq: Every day | ORAL | Status: DC

## 2013-03-13 ENCOUNTER — Ambulatory Visit: Payer: Medicare Other | Admitting: Cardiology

## 2013-04-03 ENCOUNTER — Ambulatory Visit: Payer: Medicare Other | Admitting: Cardiology

## 2013-04-04 ENCOUNTER — Encounter: Payer: Self-pay | Admitting: Cardiology

## 2013-04-04 ENCOUNTER — Ambulatory Visit (INDEPENDENT_AMBULATORY_CARE_PROVIDER_SITE_OTHER): Payer: Medicare Other | Admitting: Cardiology

## 2013-04-04 ENCOUNTER — Ambulatory Visit (INDEPENDENT_AMBULATORY_CARE_PROVIDER_SITE_OTHER): Payer: Medicare Other | Admitting: *Deleted

## 2013-04-04 VITALS — BP 130/58 | HR 60 | Ht 61.0 in | Wt 118.0 lb

## 2013-04-04 DIAGNOSIS — I351 Nonrheumatic aortic (valve) insufficiency: Secondary | ICD-10-CM

## 2013-04-04 DIAGNOSIS — I10 Essential (primary) hypertension: Secondary | ICD-10-CM

## 2013-04-04 DIAGNOSIS — I480 Paroxysmal atrial fibrillation: Secondary | ICD-10-CM

## 2013-04-04 DIAGNOSIS — Z5181 Encounter for therapeutic drug level monitoring: Secondary | ICD-10-CM | POA: Insufficient documentation

## 2013-04-04 DIAGNOSIS — I359 Nonrheumatic aortic valve disorder, unspecified: Secondary | ICD-10-CM

## 2013-04-04 DIAGNOSIS — I4891 Unspecified atrial fibrillation: Secondary | ICD-10-CM

## 2013-04-04 DIAGNOSIS — I119 Hypertensive heart disease without heart failure: Secondary | ICD-10-CM

## 2013-04-04 DIAGNOSIS — Z950 Presence of cardiac pacemaker: Secondary | ICD-10-CM

## 2013-04-04 LAB — POCT INR: INR: 3.2

## 2013-04-04 NOTE — Progress Notes (Signed)
Kathryn Gibson Date of Birth:  August 06, 1933 [address] [place], [place]  [address] [phone]  Fax   [phone]  HPI: This pleasant 80 year old woman is seen for a four-month followup office visit.  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  She has a functioning dual-chamber pacemaker.  Since last visit she has had very few palpitations.  She has had only one recognized episode of atrial fibrillation which lasted about an hour.  This is of note because she has been under more stress.  She has been flying back and forth to [facility] to help care for her son who has lung cancer and he was just released from the hospital after being there for a month.  Current Outpatient Prescriptions  Medication Sig Dispense Refill  . Alpha-D-Galactosidase (BEANO PO) Take by mouth. As needed      . amiodarone (PACERONE) 200 MG tablet Take 1/2 tablet daily  45 tablet  3  . Ascorbic Acid (VITAMIN C) 1000 MG tablet Take 1,000 mg by mouth daily.        Marland Kitchen aspirin 81 MG tablet Take 81 mg by mouth daily.        . Bromfenac Sodium (BROMDAY OP) Apply to eye.        . calcium carbonate 200 MG capsule Take 600 mg by mouth daily.       . Calcium Carbonate Antacid (TUMS E-X PO) Take by mouth. As needed      . cholecalciferol (VITAMIN D) 1000 UNITS tablet Take 1,000 Units by mouth daily.        . clobetasol (OLUX) 0.05 % topical foam Use as needed      . fish oil-omega-3 fatty acids 1000 MG capsule Take 1 g by mouth.        . levothyroxine (SYNTHROID, LEVOTHROID) 50 MCG tablet Take 50 mcg by mouth daily. 27mcg po and 75 mcg po alternate days       . metoprolol succinate (TOPROL-XL) 50 MG 24 hr tablet Take 1 tablet (50 mg total) by mouth daily.  90 tablet  3  . Multiple Vitamin (MULTIVITAMIN) capsule Take 1 capsule by mouth daily.        . polyethylene glycol powder (MIRALAX) powder Take by mouth daily. Two capfuls at bedtime      . Probiotic  Product (ALIGN PO) Take by mouth as needed.       . verapamil (CALAN-SR) 240 MG CR tablet Take 1 tablet (240 mg total) by mouth at bedtime.  90 tablet  4  . warfarin (COUMADIN) 5 MG tablet Take 1 tablet (5 mg total) by mouth as directed.  100 tablet  1  . zolpidem (AMBIEN) 10 MG tablet Take 10 mg by mouth at bedtime as needed.         No current facility-administered medications for this visit.    No Known Allergies  Patient Active Problem List   Diagnosis Date Noted  . Atrial fibrillation 05/19/2010    Priority: Medium  . Essential hypertension, benign 04/18/2010    Priority: Medium  . BRADYCARDIA-TACHYCARDIA SYNDROME 12/11/2008    Priority: Medium  . Encounter for therapeutic drug monitoring 04/04/2013  . Sciatica of left side 11/22/2011  . Aortic valve insufficiency 03/25/2011  . Dyspnea 11/24/2010  . HYPOTHYROIDISM 04/15/2010  . PALPITATIONS 04/15/2010  . IRRITABLE BOWEL SYNDROME 12/11/2008  . COLONIC POLYPS, ADENOMATOUS, HX OF 12/11/2008  . CONSTIPATION 11/13/2008  . ABDOMINAL BLOATING 11/13/2008  .  COAGULOPATHY 07/03/2008  . CONSTIPATION, CHRONIC 07/03/2008  . CHANGE IN BOWELS 07/03/2008  . UNSPECIFIED DISORDER OF THYROID 07/02/2008  . DIVERTICULOSIS, COLON 07/02/2008    History  Smoking status  . Former Smoker  . Types: Cigarettes  . Quit date: 02/09/1980  Smokeless tobacco  . Never Used    History  Alcohol Use  . 8.4 oz/week  . 14 Glasses of wine per week    Family History  Problem Relation Age of Onset  . Heart failure Mother   . Heart attack Father   . Colon cancer Neg Hx   . Diabetes Paternal Grandfather   . Kidney disease Neg Hx   . Liver disease Neg Hx   . Esophageal cancer Neg Hx   . Heart disease Brother   . Lung cancer Son     Review of Systems: The patient denies any heat or cold intolerance.  No weight gain or weight loss.  The patient denies headaches or blurry vision.  There is no cough or sputum production.  The patient denies  dizziness.  There is no hematuria or hematochezia.  The patient denies any muscle aches or arthritis.  The patient denies any rash.  The patient denies frequent falling or instability.  There is no history of depression or anxiety.  All other systems were reviewed and are negative.   Physical Exam: Filed Vitals:   04/04/13 1425  BP: 130/58  Pulse: 60   the general appearance is that of a healthy appearing middle-aged woman in no distress.The head and neck exam reveals pupils equal and reactive.  Extraocular movements are full.  There is no scleral icterus.  The mouth and pharynx are normal.  The neck is supple.  The carotids reveal no bruits.  The jugular venous pressure is normal.  The  thyroid is not enlarged.  There is no lymphadenopathy.  The chest is clear to percussion and auscultation.  There are no rales or rhonchi.  Expansion of the chest is symmetrical.  The precordium is quiet.  The first heart sound is normal.  The second heart sound is physiologically split.  There is no murmur gallop rub or click.  There is no abnormal lift or heave.  The abdomen is soft and nontender.  The bowel sounds are normal.  The liver and spleen are not enlarged.  There are no abdominal masses.  There are no abdominal bruits.  Extremities reveal good pedal pulses.  There is no phlebitis or edema.  There is no cyanosis or clubbing.  Strength is normal and symmetrical in all extremities.  There is no lateralizing weakness.  There are no sensory deficits.  The skin is warm and dry.  There is no rash.  EKG today shows atrial pacemaker with normal QRS complex.  She has mild first degree AV block      Assessment / Plan: The patient is to continue same medication.  Recheck in 4 months for followup office visit.  Continue regular aerobic exercise.

## 2013-04-04 NOTE — Patient Instructions (Signed)
Your physician recommends that you continue on your current medications as directed. Please refer to the Current Medication list given to you today.  Your physician recommends that you schedule a follow-up appointment in: 4 MONTH OV 

## 2013-04-04 NOTE — Assessment & Plan Note (Signed)
Blood pressure is remaining stable on current therapy.

## 2013-04-04 NOTE — Assessment & Plan Note (Signed)
The patient has not had any TIA symptoms.  Her heart rate has been stable.  She remains on low-dose amiodarone 100 mg daily.

## 2013-04-04 NOTE — Assessment & Plan Note (Signed)
The patient is not having any symptoms of congestive heart failure.  She stays physically active when in town.  She uses the swimming pool and also does yoga exercises at [facility] where she lives.

## 2013-04-05 ENCOUNTER — Ambulatory Visit: Payer: Medicare Other

## 2013-04-16 ENCOUNTER — Ambulatory Visit (INDEPENDENT_AMBULATORY_CARE_PROVIDER_SITE_OTHER): Payer: Medicare Other | Admitting: Internal Medicine

## 2013-04-16 ENCOUNTER — Encounter: Payer: Self-pay | Admitting: Internal Medicine

## 2013-04-16 VITALS — BP 125/68 | HR 65 | Ht 61.0 in | Wt 118.4 lb

## 2013-04-16 DIAGNOSIS — I4891 Unspecified atrial fibrillation: Secondary | ICD-10-CM

## 2013-04-16 DIAGNOSIS — I495 Sick sinus syndrome: Secondary | ICD-10-CM

## 2013-04-16 LAB — MDC_IDC_ENUM_SESS_TYPE_INCLINIC
Battery Voltage: 3 V
Brady Statistic AP VS Percent: 97.54 %
Brady Statistic AS VP Percent: 0.03 %
Brady Statistic AS VS Percent: 2.38 %
Brady Statistic RA Percent Paced: 97.59 %
Date Time Interrogation Session: 20150309123638
Lead Channel Impedance Value: 472 Ohm
Lead Channel Pacing Threshold Amplitude: 1 V
Lead Channel Pacing Threshold Amplitude: 1 V
Lead Channel Pacing Threshold Pulse Width: 0.4 ms
Lead Channel Sensing Intrinsic Amplitude: 0.5301
Lead Channel Sensing Intrinsic Amplitude: 9.3722
Lead Channel Setting Pacing Amplitude: 2.5 V
Lead Channel Setting Sensing Sensitivity: 0.9 mV
MDC IDC MSMT LEADCHNL RA IMPEDANCE VALUE: 520 Ohm
MDC IDC MSMT LEADCHNL RA PACING THRESHOLD PULSEWIDTH: 0.4 ms
MDC IDC SET LEADCHNL RA PACING AMPLITUDE: 2 V
MDC IDC SET LEADCHNL RV PACING PULSEWIDTH: 0.4 ms
MDC IDC STAT BRADY AP VP PERCENT: 0.05 %
MDC IDC STAT BRADY RV PERCENT PACED: 0.08 %
Zone Setting Detection Interval: 350 ms
Zone Setting Detection Interval: 400 ms

## 2013-04-16 NOTE — Patient Instructions (Signed)
Your physician wants you to follow-up in: 12 months with  [name], PA You will receive a reminder letter in the mail two months in advance. If you don't receive a letter, please call our office to schedule the follow-up appointment.   Remote monitoring is used to monitor your Pacemaker or ICD from home. This monitoring reduces the number of office visits required to check your device to one time per year. It allows [place] to keep an eye on the functioning of your device to ensure it is working properly. You are scheduled for a device check from home on 07/18/13. You may send your transmission at any time that day. If you have a wireless device, the transmission will be sent automatically. After your physician reviews your transmission, you will receive a postcard with your next transmission date.

## 2013-04-16 NOTE — Progress Notes (Signed)
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 female who presents today for routine electrophysiology followup.  Since last being seen in our clinic, the patient reports doing very well. She remains very active at [employer].  She exercises regularly without limitation.  Today, she denies symptoms of palpitations, chest pain, shortness of breath,  lower extremity edema, dizziness, presyncope, or syncope.  The patient is otherwise without complaint today.   Past Medical History  Diagnosis Date  . Atrial fibrillation   . Hypertension   . Hypothyroidism   . Irritable bowel syndrome (IBS)   . Constipation   . Colonic polyp   . Diverticulosis   . Bradycardia     s/p PPM  . Hemorrhoids   . Arthritis   . Cataract    Past Surgical History  Procedure Laterality Date  . Pacemaker insertion  2010    implanted by Dr Doreatha Lew ([facility])  . Colonoscopy      Current Outpatient Prescriptions  Medication Sig Dispense Refill  . Alpha-D-Galactosidase (BEANO PO) Take 1 capsule by mouth. As needed      . amiodarone (PACERONE) 200 MG tablet Take 1/2 tablet daily  45 tablet  3  . Ascorbic Acid (VITAMIN C) 1000 MG tablet Take 1,000 mg by mouth daily.        Marland Kitchen aspirin 81 MG tablet Take 81 mg by mouth daily.        . Bromfenac Sodium (BROMDAY OP) Apply to eye.        . calcium carbonate 200 MG capsule Take 600 mg by mouth daily.       . Calcium Carbonate Antacid (TUMS E-X PO) Take 1 capsule by mouth. As needed      . cholecalciferol (VITAMIN D) 1000 UNITS tablet Take 1,000 Units by mouth daily.        . clobetasol (OLUX) 0.05 % topical foam Use as needed      . fish oil-omega-3 fatty acids 1000 MG capsule Take 1 g by mouth daily.       Marland Kitchen levothyroxine (SYNTHROID, LEVOTHROID) 50 MCG tablet Take 12mcg by mouth and 75 mcg by mouth on alternate days      . metoprolol succinate (TOPROL-XL) 50 MG 24 hr tablet Take 1 tablet (50 mg total) by mouth daily.  90 tablet  3   . Multiple Vitamin (MULTIVITAMIN) capsule Take 1 capsule by mouth daily.        . polyethylene glycol powder (MIRALAX) powder Two capfuls at bedtime      . Probiotic Product (ALIGN PO) Take 1 capsule by mouth daily.       . verapamil (CALAN-SR) 240 MG CR tablet Take 1 tablet (240 mg total) by mouth at bedtime.  90 tablet  4  . warfarin (COUMADIN) 5 MG tablet Take 1 tablet (5 mg total) by mouth as directed.  100 tablet  1  . zolpidem (AMBIEN) 10 MG tablet Take 10 mg by mouth at bedtime as needed.         No current facility-administered medications for this visit.    Physical Exam: Filed Vitals:   04/16/13 1155  BP: 125/68  Pulse: 65  Height: 5\' 1"  (1.549 m)  Weight: 118 lb 6.4 oz (53.706 kg)    GEN- The patient is well appearing, alert and oriented x 3 today.   Head- normocephalic, atraumatic Eyes-  Sclera clear, conjunctiva pink Ears- hearing intact Oropharynx- clear Lungs- Clear to ausculation bilaterally,  normal work of breathing Chest- pacemaker pocket is well healed Heart- Regular rate and rhythm, no murmurs, rubs or gallops, PMI not laterally displaced GI- soft, NT, ND, + BS Extremities- no clubbing, cyanosis, or edema  Pacemaker interrogation- reviewed in detail today,  See PACEART report ekg today reveals atrial pacing at 65 bpm, nonspecific ST/T changes  Assessment and Plan:  1. Sick sinus syndrome Normal pacemaker function See Pace Art report No changes today  2. afib Well controlled apprporiately anticoagulated with coumadin chads2vasc score is at least 4.  carelink Return to see Jerene Pitch in 1 year Follow-up with Dr Mare Ferrari as scheduled

## 2013-04-20 ENCOUNTER — Ambulatory Visit (INDEPENDENT_AMBULATORY_CARE_PROVIDER_SITE_OTHER): Payer: Medicare Other | Admitting: *Deleted

## 2013-04-20 DIAGNOSIS — Z5181 Encounter for therapeutic drug level monitoring: Secondary | ICD-10-CM

## 2013-04-20 DIAGNOSIS — I4891 Unspecified atrial fibrillation: Secondary | ICD-10-CM

## 2013-04-20 LAB — POCT INR: INR: 2.5

## 2013-04-23 ENCOUNTER — Ambulatory Visit: Payer: Medicare Other

## 2013-05-02 ENCOUNTER — Encounter: Payer: Medicare Other | Admitting: Internal Medicine

## 2013-05-02 ENCOUNTER — Other Ambulatory Visit: Payer: Self-pay | Admitting: *Deleted

## 2013-05-02 ENCOUNTER — Other Ambulatory Visit: Payer: Self-pay

## 2013-05-02 DIAGNOSIS — I4891 Unspecified atrial fibrillation: Secondary | ICD-10-CM

## 2013-05-02 MED ORDER — WARFARIN SODIUM 5 MG PO TABS: ORAL_TABLET | ORAL | Status: DC

## 2013-05-02 MED ORDER — VERAPAMIL HCL ER 240 MG PO TBCR: 240.0000 mg | EXTENDED_RELEASE_TABLET | Freq: Every day | ORAL | Status: DC

## 2013-05-08 ENCOUNTER — Ambulatory Visit
Admission: RE | Admit: 2013-05-08 | Discharge: 2013-05-08 | Disposition: A | Discharge status: Home / Self Care | Payer: Medicare Other | Source: Ambulatory Visit

## 2013-05-08 DIAGNOSIS — Z1231 Encounter for screening mammogram for malignant neoplasm of breast: Secondary | ICD-10-CM

## 2013-05-16 ENCOUNTER — Encounter: Payer: Self-pay | Admitting: Cardiology

## 2013-05-18 ENCOUNTER — Ambulatory Visit (INDEPENDENT_AMBULATORY_CARE_PROVIDER_SITE_OTHER): Payer: Medicare Other | Admitting: *Deleted

## 2013-05-18 DIAGNOSIS — I4891 Unspecified atrial fibrillation: Secondary | ICD-10-CM

## 2013-05-18 DIAGNOSIS — Z5181 Encounter for therapeutic drug level monitoring: Secondary | ICD-10-CM

## 2013-05-18 LAB — POCT INR: INR: 2.2

## 2013-05-29 ENCOUNTER — Telehealth: Payer: Self-pay | Admitting: Cardiology

## 2013-05-29 NOTE — Telephone Encounter (Signed)
Advised patient, verbalized understanding  

## 2013-05-29 NOTE — Telephone Encounter (Signed)
For the last 5 days she is having multiple episodes of her heart going in and out of rhythm heart starts going up to 80 and then suddenly drops, happens several times a day. Patient states this is usual for her and wants for  Dr. Mare Ferrari to be aware. Will forward to him for review.

## 2013-05-29 NOTE — Telephone Encounter (Signed)
The heart rate of 80 is not significantly elevated.  If her arrhythmia is bothering her a lot she could try taking an extra half Toprol daily and see if it helps to stabilize the rhythm.  She has a pacemaker which will prevent her heart rate from going too low.

## 2013-05-29 NOTE — Telephone Encounter (Signed)
New Message  Pt called. Requests a call back to discuss tachycardia. She states that she has had symptoms every day since 04/17. Please call back to discuss.

## 2013-06-18 ENCOUNTER — Ambulatory Visit (INDEPENDENT_AMBULATORY_CARE_PROVIDER_SITE_OTHER): Payer: Medicare Other

## 2013-06-18 DIAGNOSIS — Z5181 Encounter for therapeutic drug level monitoring: Secondary | ICD-10-CM

## 2013-06-18 DIAGNOSIS — I4891 Unspecified atrial fibrillation: Secondary | ICD-10-CM

## 2013-06-18 LAB — POCT INR: INR: 2.2

## 2013-07-18 ENCOUNTER — Ambulatory Visit (INDEPENDENT_AMBULATORY_CARE_PROVIDER_SITE_OTHER): Payer: Medicare Other | Admitting: *Deleted

## 2013-07-18 ENCOUNTER — Telehealth: Payer: Self-pay | Admitting: Cardiology

## 2013-07-18 DIAGNOSIS — I495 Sick sinus syndrome: Secondary | ICD-10-CM

## 2013-07-18 DIAGNOSIS — I4891 Unspecified atrial fibrillation: Secondary | ICD-10-CM

## 2013-07-18 LAB — MDC_IDC_ENUM_SESS_TYPE_REMOTE
Battery Voltage: 2.99 V
Brady Statistic AP VP Percent: 0.03 %
Brady Statistic RA Percent Paced: 97.59 %
Brady Statistic RV Percent Paced: 0.04 %
Date Time Interrogation Session: 20150610195033
Lead Channel Impedance Value: 520 Ohm
Lead Channel Sensing Intrinsic Amplitude: 10.0416
Lead Channel Setting Pacing Amplitude: 2 V
Lead Channel Setting Sensing Sensitivity: 0.9 mV
MDC IDC MSMT LEADCHNL RA SENSING INTR AMPL: 1.1928
MDC IDC MSMT LEADCHNL RV IMPEDANCE VALUE: 432 Ohm
MDC IDC SET LEADCHNL RV PACING AMPLITUDE: 2.5 V
MDC IDC SET LEADCHNL RV PACING PULSEWIDTH: 0.4 ms
MDC IDC STAT BRADY AP VS PERCENT: 97.56 %
MDC IDC STAT BRADY AS VP PERCENT: 0.01 %
MDC IDC STAT BRADY AS VS PERCENT: 2.4 %
Zone Setting Detection Interval: 350 ms
Zone Setting Detection Interval: 400 ms

## 2013-07-18 NOTE — Progress Notes (Signed)
Remote pacemaker transmission.   

## 2013-07-18 NOTE — Telephone Encounter (Signed)
LMOVM reminding pt to send remote transmission.   

## 2013-07-23 ENCOUNTER — Ambulatory Visit: Payer: Medicare Other | Admitting: Cardiology

## 2013-07-24 ENCOUNTER — Encounter: Payer: Self-pay | Admitting: Cardiology

## 2013-07-24 ENCOUNTER — Ambulatory Visit (INDEPENDENT_AMBULATORY_CARE_PROVIDER_SITE_OTHER): Payer: Medicare Other | Admitting: Pharmacist

## 2013-07-24 ENCOUNTER — Ambulatory Visit (INDEPENDENT_AMBULATORY_CARE_PROVIDER_SITE_OTHER): Payer: Medicare Other | Admitting: Cardiology

## 2013-07-24 VITALS — BP 118/58 | HR 68 | Ht 61.0 in | Wt 120.0 lb

## 2013-07-24 DIAGNOSIS — I359 Nonrheumatic aortic valve disorder, unspecified: Secondary | ICD-10-CM

## 2013-07-24 DIAGNOSIS — R198 Other specified symptoms and signs involving the digestive system and abdomen: Secondary | ICD-10-CM

## 2013-07-24 DIAGNOSIS — Z5181 Encounter for therapeutic drug level monitoring: Secondary | ICD-10-CM

## 2013-07-24 DIAGNOSIS — I351 Nonrheumatic aortic (valve) insufficiency: Secondary | ICD-10-CM

## 2013-07-24 DIAGNOSIS — I4891 Unspecified atrial fibrillation: Secondary | ICD-10-CM

## 2013-07-24 LAB — POCT INR: INR: 2

## 2013-07-24 NOTE — Patient Instructions (Signed)
Your physician recommends that you continue on your current medications as directed. Please refer to the Current Medication list given to you today.   Your physician wants you to follow-up in: 4 months with EKG.   You will receive a reminder letter in the mail two months in advance. If you don't receive a letter, please call our office to schedule the follow-up appointment @ [phone]

## 2013-07-24 NOTE — Assessment & Plan Note (Signed)
The patient has not had any symptoms referable to her mild mitral regurgitation and mild aortic insufficiency.  Her exercise tolerance is good

## 2013-07-24 NOTE — Assessment & Plan Note (Signed)
At her last visit the patient had been complaining of a lot of digestive symptoms with noisy bowel sounds etc. on her own she started to drink hot water with lemon prior to meals and her symptoms have resolved significantly.

## 2013-07-24 NOTE — Progress Notes (Signed)
Kathryn Gibson Date of Birth:  September 17, 1933 [place],   [address] [phone]  Fax   [phone]  HPI: This pleasant 80 year old woman is seen for a scheduled four-month followup office visit.  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  She has a functioning dual-chamber pacemaker and is followed by Dr. Rayann Heman.  Since we last saw her she has been doing well.  She had been under more stress because her son who was living in [place] succumbed to lung cancer after battling it for 5 years. The patient has not been having any new cardiac symptoms.  She exercises by swimming and has added yoga.  She also intends to do more walking.  Current Outpatient Prescriptions  Medication Sig Dispense Refill  . Alpha-D-Galactosidase (BEANO PO) Take 1 capsule by mouth. As needed      . amiodarone (PACERONE) 200 MG tablet Take 1/2 tablet daily  45 tablet  3  . Ascorbic Acid (VITAMIN C) 1000 MG tablet Take 1,000 mg by mouth daily.        Marland Kitchen aspirin 81 MG tablet Take 81 mg by mouth daily.        . Bromfenac Sodium (BROMDAY OP) Apply to eye.        . calcium carbonate 200 MG capsule Take 600 mg by mouth daily.       . Calcium Carbonate Antacid (TUMS E-X PO) Take 1 capsule by mouth. As needed      . cholecalciferol (VITAMIN D) 1000 UNITS tablet Take 1,000 Units by mouth daily.        . clobetasol (OLUX) 0.05 % topical foam Use as needed      . fish oil-omega-3 fatty acids 1000 MG capsule Take 1 g by mouth daily.       Marland Kitchen levothyroxine (SYNTHROID, LEVOTHROID) 50 MCG tablet Take 69mcg by mouth and 75 mcg by mouth on alternate days      . metoprolol succinate (TOPROL-XL) 50 MG 24 hr tablet Take 50 mg by mouth as directed. 1 and 1/2 tablet daily      . Multiple Vitamin (MULTIVITAMIN) capsule Take 1 capsule by mouth daily.        . polyethylene glycol powder (MIRALAX) powder Two capfuls at bedtime      . Probiotic  Product (ALIGN PO) Take 1 capsule by mouth daily.       . temazepam (RESTORIL) 30 MG capsule Take 30 mg by mouth at bedtime as needed for sleep.      . verapamil (CALAN-SR) 240 MG CR tablet Take 1 tablet (240 mg total) by mouth at bedtime.  90 tablet  4  . warfarin (COUMADIN) 5 MG tablet Take as directed by coumadin clinic  100 tablet  1   No current facility-administered medications for this visit.    No Known Allergies  Patient Active Problem List   Diagnosis Date Noted  . Atrial fibrillation 05/19/2010    Priority: Medium  . Essential hypertension, benign 04/18/2010    Priority: Medium  . BRADYCARDIA-TACHYCARDIA SYNDROME 12/11/2008    Priority: Medium  . Encounter for therapeutic drug monitoring 04/04/2013  . Sciatica of left side 11/22/2011  . Aortic valve insufficiency 03/25/2011  . Dyspnea 11/24/2010  . HYPOTHYROIDISM 04/15/2010  . PALPITATIONS 04/15/2010  . IRRITABLE BOWEL SYNDROME 12/11/2008  . COLONIC POLYPS, ADENOMATOUS, HX OF 12/11/2008  . CONSTIPATION 11/13/2008  . ABDOMINAL BLOATING 11/13/2008  . COAGULOPATHY 07/03/2008  .  CONSTIPATION, CHRONIC 07/03/2008  . CHANGE IN BOWELS 07/03/2008  . UNSPECIFIED DISORDER OF THYROID 07/02/2008  . DIVERTICULOSIS, COLON 07/02/2008    History  Smoking status  . Former Smoker  . Types: Cigarettes  . Quit date: 02/09/1980  Smokeless tobacco  . Never Used    History  Alcohol Use  . 8.4 oz/week  . 14 Glasses of wine per week    Family History  Problem Relation Age of Onset  . Heart failure Mother   . Heart attack Father   . Colon cancer Neg Hx   . Diabetes Paternal Grandfather   . Kidney disease Neg Hx   . Liver disease Neg Hx   . Esophageal cancer Neg Hx   . Heart disease Brother   . Lung cancer Son     Review of Systems: The patient denies any heat or cold intolerance.  No weight gain or weight loss.  The patient denies headaches or blurry vision.  There is no cough or sputum production.  The patient denies  dizziness.  There is no hematuria or hematochezia.  The patient denies any muscle aches or arthritis.  The patient denies any rash.  The patient denies frequent falling or instability.  There is no history of depression or anxiety.  All other systems were reviewed and are negative.   Physical Exam: Filed Vitals:   07/24/13 1338  BP: 118/58  Pulse: 68   the general appearance reveals a well-developed well-nourished woman in no distress.  Her weight is up 2 pounds since last visit.The head and neck exam reveals pupils equal and reactive.  Extraocular movements are full.  There is no scleral icterus.  The mouth and pharynx are normal.  The neck is supple.  The carotids reveal no bruits.  The jugular venous pressure is normal.  The  thyroid is not enlarged.  There is no lymphadenopathy.  The chest is clear to percussion and auscultation.  There are no rales or rhonchi.  Expansion of the chest is symmetrical.  The precordium is quiet.  The first heart sound is normal.  The second heart sound is physiologically split.  There is no murmur gallop rub or click.  There is no abnormal lift or heave.  The abdomen is soft and nontender.  The bowel sounds are normal.  The liver and spleen are not enlarged.  There are no abdominal masses.  There are no abdominal bruits.  Extremities reveal good pedal pulses.  There is no phlebitis or edema.  There is no cyanosis or clubbing.  Strength is normal and symmetrical in all extremities.  There is no lateralizing weakness.  There are no sensory deficits.  The skin is warm and dry.  There is no rash.      Assessment / Plan:  1.  Paroxysmal atrial fibrillation, on warfarin and amiodarone 2. tachybradycardia syndrome with dual-chamber pacemaker 3.  Benign hypertensive heart disease without heart failure 4. chronic insomnia, improved on temazepam  Continue current medication.  Recheck in 4 months for office visit and EKG

## 2013-07-24 NOTE — Assessment & Plan Note (Signed)
She has had occasional brief episodes of palpitations or fluttering.  These do not last long and are not sustained.  She remains on amiodarone 100 mg daily.  Her last chest x-ray was less than a year ago and was satisfactory.  Her thyroid function has been monitored by Dr. Dagmar Hait.  She does have a history of hypothyroidism and is on Synthroid replacement

## 2013-08-07 ENCOUNTER — Encounter: Payer: Self-pay | Admitting: Cardiology

## 2013-08-21 ENCOUNTER — Ambulatory Visit (INDEPENDENT_AMBULATORY_CARE_PROVIDER_SITE_OTHER): Payer: Medicare Other | Admitting: *Deleted

## 2013-08-21 DIAGNOSIS — I4891 Unspecified atrial fibrillation: Secondary | ICD-10-CM

## 2013-08-21 DIAGNOSIS — Z5181 Encounter for therapeutic drug level monitoring: Secondary | ICD-10-CM

## 2013-08-21 LAB — POCT INR: INR: 2.4

## 2013-09-25 ENCOUNTER — Ambulatory Visit (INDEPENDENT_AMBULATORY_CARE_PROVIDER_SITE_OTHER): Payer: Medicare Other | Admitting: *Deleted

## 2013-09-25 DIAGNOSIS — I4891 Unspecified atrial fibrillation: Secondary | ICD-10-CM

## 2013-09-25 DIAGNOSIS — Z5181 Encounter for therapeutic drug level monitoring: Secondary | ICD-10-CM

## 2013-09-25 LAB — POCT INR: INR: 2.7

## 2013-10-10 ENCOUNTER — Encounter: Payer: Self-pay | Admitting: Internal Medicine

## 2013-10-22 ENCOUNTER — Ambulatory Visit (INDEPENDENT_AMBULATORY_CARE_PROVIDER_SITE_OTHER): Payer: Medicare Other | Admitting: *Deleted

## 2013-10-22 DIAGNOSIS — I495 Sick sinus syndrome: Secondary | ICD-10-CM

## 2013-10-22 LAB — MDC_IDC_ENUM_SESS_TYPE_REMOTE
Brady Statistic AP VP Percent: 0.05 %
Brady Statistic AP VS Percent: 97.21 %
Brady Statistic AS VS Percent: 2.72 %
Brady Statistic RV Percent Paced: 0.07 %
Lead Channel Impedance Value: 520 Ohm
Lead Channel Sensing Intrinsic Amplitude: 1.1928
Lead Channel Setting Pacing Amplitude: 2 V
Lead Channel Setting Pacing Pulse Width: 0.4 ms
Lead Channel Setting Sensing Sensitivity: 0.9 mV
MDC IDC MSMT BATTERY VOLTAGE: 2.99 V
MDC IDC MSMT LEADCHNL RV IMPEDANCE VALUE: 440 Ohm
MDC IDC MSMT LEADCHNL RV SENSING INTR AMPL: 9.3722
MDC IDC SESS DTM: 20150914181202
MDC IDC SET LEADCHNL RV PACING AMPLITUDE: 2.5 V
MDC IDC STAT BRADY AS VP PERCENT: 0.03 %
MDC IDC STAT BRADY RA PERCENT PACED: 97.25 %
Zone Setting Detection Interval: 350 ms
Zone Setting Detection Interval: 400 ms

## 2013-10-22 NOTE — Progress Notes (Signed)
Remote pacemaker transmission.   

## 2013-10-30 ENCOUNTER — Encounter: Payer: Self-pay | Admitting: Cardiology

## 2013-11-01 ENCOUNTER — Encounter: Payer: Self-pay | Admitting: Internal Medicine

## 2013-11-06 ENCOUNTER — Ambulatory Visit (INDEPENDENT_AMBULATORY_CARE_PROVIDER_SITE_OTHER): Payer: Medicare Other | Admitting: Pharmacist

## 2013-11-06 DIAGNOSIS — I4891 Unspecified atrial fibrillation: Secondary | ICD-10-CM

## 2013-11-06 DIAGNOSIS — Z5181 Encounter for therapeutic drug level monitoring: Secondary | ICD-10-CM

## 2013-11-06 LAB — POCT INR: INR: 3.8

## 2013-11-20 ENCOUNTER — Ambulatory Visit (INDEPENDENT_AMBULATORY_CARE_PROVIDER_SITE_OTHER): Payer: Medicare Other | Admitting: *Deleted

## 2013-11-20 DIAGNOSIS — I4891 Unspecified atrial fibrillation: Secondary | ICD-10-CM

## 2013-11-20 DIAGNOSIS — Z5181 Encounter for therapeutic drug level monitoring: Secondary | ICD-10-CM

## 2013-11-20 LAB — POCT INR: INR: 3.1

## 2013-12-10 ENCOUNTER — Ambulatory Visit (INDEPENDENT_AMBULATORY_CARE_PROVIDER_SITE_OTHER): Payer: Medicare Other | Admitting: Cardiology

## 2013-12-10 ENCOUNTER — Ambulatory Visit (INDEPENDENT_AMBULATORY_CARE_PROVIDER_SITE_OTHER): Payer: Medicare Other

## 2013-12-10 VITALS — BP 130/62 | HR 61 | Ht 61.0 in | Wt 119.0 lb

## 2013-12-10 DIAGNOSIS — Z5181 Encounter for therapeutic drug level monitoring: Secondary | ICD-10-CM

## 2013-12-10 DIAGNOSIS — I4891 Unspecified atrial fibrillation: Secondary | ICD-10-CM

## 2013-12-10 DIAGNOSIS — I480 Paroxysmal atrial fibrillation: Secondary | ICD-10-CM

## 2013-12-10 DIAGNOSIS — I10 Essential (primary) hypertension: Secondary | ICD-10-CM

## 2013-12-10 LAB — POCT INR: INR: 3

## 2013-12-10 NOTE — Assessment & Plan Note (Signed)
Blood pressure has been remaining stable on current therapy.  No dizziness or syncope.  Chest pain or angina.

## 2013-12-10 NOTE — Assessment & Plan Note (Signed)
She has not been aware of any recurrent atrial fibrillation.

## 2013-12-10 NOTE — Patient Instructions (Addendum)
Will check your coumadin today  Your physician recommends that you continue on your current medications as directed. Please refer to the Current Medication list given to you today.  Your physician recommends that you schedule a follow-up appointment in: 4 month ov

## 2013-12-10 NOTE — Progress Notes (Signed)
Kathryn Gibson Date of Birth:  09/03/1933 [address], [place]  [address] [phone]  Fax   [phone]  HPI: This pleasant 80 year old woman is seen for a scheduled four-month followup office visit.  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  She has a functioning dual-chamber pacemaker and is followed by Dr. Rayann Heman.  Since we last saw her she has been doing well.  The patient has not been having any new cardiac symptoms.  She exercises by walking in the swimming pool and has added yoga.  She also intends to do more walking.  Current Outpatient Prescriptions  Medication Sig Dispense Refill  . Alpha-D-Galactosidase (BEANO PO) Take 1 capsule by mouth. As needed    . amiodarone (PACERONE) 200 MG tablet Take 1/2 tablet daily 45 tablet 3  . Ascorbic Acid (VITAMIN C) 1000 MG tablet Take 1,000 mg by mouth daily.      Marland Kitchen aspirin 81 MG tablet Take 81 mg by mouth daily.      . Bromfenac Sodium (BROMDAY OP) Apply to eye.      . calcium carbonate 200 MG capsule Take 600 mg by mouth daily.     . Calcium Carbonate Antacid (TUMS E-X PO) Take 1 capsule by mouth. As needed    . cholecalciferol (VITAMIN D) 1000 UNITS tablet Take 1,000 Units by mouth daily.      . clobetasol (OLUX) 0.05 % topical foam Use as needed    . fish oil-omega-3 fatty acids 1000 MG capsule Take 1 g by mouth daily.     Marland Kitchen levothyroxine (SYNTHROID, LEVOTHROID) 50 MCG tablet Take 25mcg by mouth and 75 mcg by mouth on alternate days    . metoprolol succinate (TOPROL-XL) 50 MG 24 hr tablet Take 50 mg by mouth as directed. 1 and 1/2 tablet daily    . Multiple Vitamin (MULTIVITAMIN) capsule Take 1 capsule by mouth daily.      . polyethylene glycol powder (MIRALAX) powder Two capfuls at bedtime    . Probiotic Product (ALIGN PO) Take 1 capsule by mouth daily.     . temazepam (RESTORIL) 30 MG capsule Take 30 mg by mouth at bedtime as needed for  sleep.    . verapamil (CALAN-SR) 240 MG CR tablet Take 1 tablet (240 mg total) by mouth at bedtime. 90 tablet 4  . warfarin (COUMADIN) 5 MG tablet Take as directed by coumadin clinic 100 tablet 1   No current facility-administered medications for this visit.    No Known Allergies  Patient Active Problem List   Diagnosis Date Noted  . Atrial fibrillation 05/19/2010    Priority: Medium  . Essential hypertension, benign 04/18/2010    Priority: Medium  . BRADYCARDIA-TACHYCARDIA SYNDROME 12/11/2008    Priority: Medium  . Encounter for therapeutic drug monitoring 04/04/2013  . Sciatica of left side 11/22/2011  . Aortic valve insufficiency 03/25/2011  . Dyspnea 11/24/2010  . HYPOTHYROIDISM 04/15/2010  . PALPITATIONS 04/15/2010  . IRRITABLE BOWEL SYNDROME 12/11/2008  . COLONIC POLYPS, ADENOMATOUS, HX OF 12/11/2008  . CONSTIPATION 11/13/2008  . ABDOMINAL BLOATING 11/13/2008  . COAGULOPATHY 07/03/2008  . CONSTIPATION, CHRONIC 07/03/2008  . CHANGE IN BOWELS 07/03/2008  . UNSPECIFIED DISORDER OF THYROID 07/02/2008  . DIVERTICULOSIS, COLON 07/02/2008    History  Smoking status  . Former Smoker  . Types: Cigarettes  . Quit date: 02/09/1980  Smokeless tobacco  . Never Used    History  Alcohol  Use  . 8.4 oz/week  . 14 Glasses of wine per week    Family History  Problem Relation Age of Onset  . Heart failure Mother   . Heart attack Father   . Colon cancer Neg Hx   . Diabetes Paternal Grandfather   . Kidney disease Neg Hx   . Liver disease Neg Hx   . Esophageal cancer Neg Hx   . Heart disease Brother   . Lung cancer Son     Review of Systems: The patient denies any heat or cold intolerance.  No weight gain or weight loss.  The patient denies headaches or blurry vision.  There is no cough or sputum production.  The patient denies dizziness.  There is no hematuria or hematochezia.  The patient denies any muscle aches or arthritis.  The patient denies any rash.  The patient  denies frequent falling or instability.  There is no history of depression or anxiety.  All other systems were reviewed and are negative.   Physical Exam: Filed Vitals:   12/10/13 1347  BP: 130/62  Pulse: 61   the general appearance reveals a well-developed well-nourished woman in no distress.  Her weight is up 2 pounds since last visit.The head and neck exam reveals pupils equal and reactive.  Extraocular movements are full.  There is no scleral icterus.  The mouth and pharynx are normal.  The neck is supple.  The carotids reveal no bruits.  The jugular venous pressure is normal.  The  thyroid is not enlarged.  There is no lymphadenopathy.  The chest is clear to percussion and auscultation.  There are no rales or rhonchi.  Expansion of the chest is symmetrical.  The precordium is quiet.  The first heart sound is normal.  The second heart sound is physiologically split.  There is no murmur gallop rub or click.  There is no abnormal lift or heave.  The abdomen is soft and nontender.  The bowel sounds are normal.  The liver and spleen are not enlarged.  There are no abdominal masses.  There are no abdominal bruits.  Extremities reveal good pedal pulses.  There is no phlebitis or edema.  There is no cyanosis or clubbing.  Strength is normal and symmetrical in all extremities.  There is no lateralizing weakness.  There are no sensory deficits.  The skin is warm and dry.  There is no rash.   EKG today shows atrial paced rhythm at 61/m.  Nonspecific T wave abnormality.   Assessment / Plan:  1.  Paroxysmal atrial fibrillation, on warfarin and amiodarone 2. tachybradycardia syndrome with dual-chamber pacemaker 3.  Benign hypertensive heart disease without heart failure 4. chronic insomnia, improved on temazepam  Continue current medication.  Recheck in 4 months for office visit.  Consider chest x-ray after next office visit. Her last chest x-ray was in June 2014 and was normal.

## 2014-01-02 ENCOUNTER — Telehealth: Payer: Self-pay | Admitting: *Deleted

## 2014-01-02 NOTE — Telephone Encounter (Signed)
Pt called stating that she has been placed on Prednisone dose pak Prednisone 10 mg take 4 tabs for 2 days then 3 tabs for 2 days then 2 tabs for 2 days and 1 tab for 2 days. Instructed that Prednisone and coumadin have interaction and appt made to be seen on Monday and pt states understanding.

## 2014-01-07 ENCOUNTER — Ambulatory Visit (INDEPENDENT_AMBULATORY_CARE_PROVIDER_SITE_OTHER): Payer: Medicare Other | Admitting: Pharmacist

## 2014-01-07 DIAGNOSIS — Z5181 Encounter for therapeutic drug level monitoring: Secondary | ICD-10-CM

## 2014-01-07 DIAGNOSIS — I4891 Unspecified atrial fibrillation: Secondary | ICD-10-CM

## 2014-01-07 DIAGNOSIS — I480 Paroxysmal atrial fibrillation: Secondary | ICD-10-CM

## 2014-01-07 LAB — POCT INR: INR: 5.6

## 2014-01-21 ENCOUNTER — Ambulatory Visit (INDEPENDENT_AMBULATORY_CARE_PROVIDER_SITE_OTHER): Payer: Medicare Other | Admitting: Pharmacist

## 2014-01-21 DIAGNOSIS — I480 Paroxysmal atrial fibrillation: Secondary | ICD-10-CM

## 2014-01-21 DIAGNOSIS — I4891 Unspecified atrial fibrillation: Secondary | ICD-10-CM

## 2014-01-21 DIAGNOSIS — Z5181 Encounter for therapeutic drug level monitoring: Secondary | ICD-10-CM

## 2014-01-21 LAB — POCT INR: INR: 4

## 2014-01-22 ENCOUNTER — Telehealth: Payer: Self-pay | Admitting: Cardiology

## 2014-01-22 ENCOUNTER — Ambulatory Visit (INDEPENDENT_AMBULATORY_CARE_PROVIDER_SITE_OTHER): Payer: Medicare Other | Admitting: *Deleted

## 2014-01-22 ENCOUNTER — Encounter: Payer: Self-pay | Admitting: Internal Medicine

## 2014-01-22 DIAGNOSIS — I495 Sick sinus syndrome: Secondary | ICD-10-CM

## 2014-01-22 NOTE — Telephone Encounter (Signed)
Called spoke with pt, she is concerned missed Sunday's dosage of Coumadin, and then skipped yesterday's dosage of Coumadin per our instruction after INR check was 4.0.  Advised pt ok to have missed both doses of Coumadin, pt concerned INR will drop too low with 2 skipped dosages and dosage change of 1/2 tablet less per week.  Advised pt to take 5mg  today instead of 2.5mg , but then change dosage as instructed at OV 5mg  QD 2.5mg  on Tu, Th, and Sat since previous dosage was running INR supra therapeutic.

## 2014-01-22 NOTE — Progress Notes (Signed)
Remote pacemaker transmission.   

## 2014-01-22 NOTE — Telephone Encounter (Signed)
New message     For Gay Filler Pt says she did not take coumadin Sunday night.  She was seen yesterday, but pt did not know at that time she had missed Sunday's dosage.  She said her coumadin level was "off".  Could this be the reason why?

## 2014-01-23 LAB — MDC_IDC_ENUM_SESS_TYPE_REMOTE
Battery Voltage: 2.99 V
Brady Statistic AP VS Percent: 97.41 %
Brady Statistic AS VP Percent: 0.04 %
Brady Statistic RA Percent Paced: 97.45 %
Brady Statistic RV Percent Paced: 0.08 %
Date Time Interrogation Session: 20151215143125
Lead Channel Impedance Value: 424 Ohm
Lead Channel Setting Pacing Amplitude: 2.5 V
Lead Channel Setting Sensing Sensitivity: 0.9 mV
MDC IDC MSMT LEADCHNL RA IMPEDANCE VALUE: 488 Ohm
MDC IDC MSMT LEADCHNL RA SENSING INTR AMPL: 1.1 mV
MDC IDC MSMT LEADCHNL RV SENSING INTR AMPL: 8.7 mV
MDC IDC SET LEADCHNL RA PACING AMPLITUDE: 2 V
MDC IDC SET LEADCHNL RV PACING PULSEWIDTH: 0.4 ms
MDC IDC STAT BRADY AP VP PERCENT: 0.04 %
MDC IDC STAT BRADY AS VS PERCENT: 2.51 %
Zone Setting Detection Interval: 350 ms
Zone Setting Detection Interval: 400 ms

## 2014-01-28 ENCOUNTER — Telehealth: Payer: Self-pay | Admitting: Cardiology

## 2014-01-28 ENCOUNTER — Other Ambulatory Visit: Payer: Self-pay | Admitting: *Deleted

## 2014-01-28 MED ORDER — WARFARIN SODIUM 5 MG PO TABS: ORAL_TABLET | ORAL | Status: DC

## 2014-01-28 NOTE — Telephone Encounter (Signed)
New Msg    Pt missed nighttime  Medication so she took it this morning. Pt concerned about dosage please call at [phone].

## 2014-01-28 NOTE — Telephone Encounter (Signed)
Spoke with pt.  She took her dose of Coumadin from yesterday this morning.  Will have her go back to her normal dose tonight.

## 2014-02-07 ENCOUNTER — Ambulatory Visit (INDEPENDENT_AMBULATORY_CARE_PROVIDER_SITE_OTHER): Payer: Medicare Other | Admitting: Pharmacist

## 2014-02-07 DIAGNOSIS — Z5181 Encounter for therapeutic drug level monitoring: Secondary | ICD-10-CM

## 2014-02-07 DIAGNOSIS — I480 Paroxysmal atrial fibrillation: Secondary | ICD-10-CM

## 2014-02-07 DIAGNOSIS — I4891 Unspecified atrial fibrillation: Secondary | ICD-10-CM

## 2014-02-07 LAB — POCT INR: INR: 2.9

## 2014-02-14 ENCOUNTER — Encounter: Payer: Self-pay | Admitting: Cardiology

## 2014-02-16 ENCOUNTER — Other Ambulatory Visit: Payer: Self-pay | Admitting: Cardiology

## 2014-03-07 ENCOUNTER — Ambulatory Visit (INDEPENDENT_AMBULATORY_CARE_PROVIDER_SITE_OTHER): Payer: Medicare Other | Admitting: *Deleted

## 2014-03-07 DIAGNOSIS — I4891 Unspecified atrial fibrillation: Secondary | ICD-10-CM

## 2014-03-07 DIAGNOSIS — Z5181 Encounter for therapeutic drug level monitoring: Secondary | ICD-10-CM

## 2014-03-07 DIAGNOSIS — I480 Paroxysmal atrial fibrillation: Secondary | ICD-10-CM

## 2014-03-07 LAB — POCT INR: INR: 2.6

## 2014-04-04 ENCOUNTER — Ambulatory Visit (INDEPENDENT_AMBULATORY_CARE_PROVIDER_SITE_OTHER): Payer: Medicare Other | Admitting: *Deleted

## 2014-04-04 DIAGNOSIS — I4891 Unspecified atrial fibrillation: Secondary | ICD-10-CM

## 2014-04-04 DIAGNOSIS — I480 Paroxysmal atrial fibrillation: Secondary | ICD-10-CM

## 2014-04-04 DIAGNOSIS — Z5181 Encounter for therapeutic drug level monitoring: Secondary | ICD-10-CM

## 2014-04-04 LAB — POCT INR: INR: 2.7

## 2014-04-09 ENCOUNTER — Other Ambulatory Visit: Payer: Self-pay

## 2014-04-09 DIAGNOSIS — Z1231 Encounter for screening mammogram for malignant neoplasm of breast: Secondary | ICD-10-CM

## 2014-04-12 ENCOUNTER — Other Ambulatory Visit: Payer: Self-pay | Admitting: Cardiology

## 2014-04-15 ENCOUNTER — Other Ambulatory Visit: Payer: Self-pay | Admitting: Cardiology

## 2014-04-15 NOTE — Telephone Encounter (Signed)
Should this be 1.5 tablets qd? Please advise. Thanks, MI

## 2014-04-16 ENCOUNTER — Encounter: Payer: Self-pay | Admitting: Cardiology

## 2014-04-16 ENCOUNTER — Other Ambulatory Visit: Payer: Self-pay | Admitting: *Deleted

## 2014-04-16 ENCOUNTER — Ambulatory Visit (INDEPENDENT_AMBULATORY_CARE_PROVIDER_SITE_OTHER): Payer: Medicare Other | Admitting: Cardiology

## 2014-04-16 VITALS — BP 108/70 | HR 68 | Ht 61.0 in | Wt 121.2 lb

## 2014-04-16 DIAGNOSIS — I480 Paroxysmal atrial fibrillation: Secondary | ICD-10-CM

## 2014-04-16 DIAGNOSIS — I119 Hypertensive heart disease without heart failure: Secondary | ICD-10-CM

## 2014-04-16 DIAGNOSIS — Z79899 Other long term (current) drug therapy: Secondary | ICD-10-CM

## 2014-04-16 MED ORDER — METOPROLOL SUCCINATE ER 50 MG PO TB24: 50.0000 mg | ORAL_TABLET | Freq: Every day | ORAL | Status: DC

## 2014-04-16 NOTE — Patient Instructions (Signed)
Your physician recommends that you continue on your current medications as directed. Please refer to the Current Medication list given to you today.  Your physician wants you to follow-up in: [place] will receive a reminder letter in the mail two months in advance. If you don't receive a letter, please call our office to schedule the follow-up appointment.   A chest x-ray takes a picture of the organs and structures inside the chest, including the heart, lungs, and blood vessels. This test can show several things, including, whether the heart is enlarges; whether fluid is building up in the lungs; and whether pacemaker / defibrillator leads are still in place. [place]

## 2014-04-16 NOTE — Progress Notes (Signed)
Cardiology Office Note   Date:  04/16/2014   ID:  Kathryn Gibson, DOB [birth year], MRN [id number]  PCP:  Tivis Ringer, MD  Cardiologist:   Darlin Coco, MD   No chief complaint on file.     History of Present Illness: Kathryn Gibson is a 81 y.o. female who presents for scheduled four-month follow-up office visit This pleasant 81 year old woman is seen for a scheduled four-month followup office visit. 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 She has a functioning dual-chamber pacemaker and is followed by Dr. Rayann Heman. Since we last saw her she has been doing well.  The patient has not been having any new cardiac symptoms. She exercises by walking in the swimming pool and has added yoga. She also intends to do more walking.  Since we last saw her she injured her right leg in exercise class.  He saw her orthopedist and also a Restaurant manager, fast food.  She apparently had a bad strain all of her quadriceps muscle.  She has been able to get back into the water aerobics class and she is back in her yoga class now.  After her leg injury she also had a flareup of her sciatica. The cardiac standpoint she has done well.  She has had one brief episode of irregular heartbeat lasting less than 2 hours. The patient has had problems with drooping eyelids which is affecting her field of vision and may need surgery.  From a cardiac standpoint she would be okay for surgery and we would need to hold her Coumadin.   Past Medical History  Diagnosis Date  . Atrial fibrillation   . Hypertension   . Hypothyroidism   . Irritable bowel syndrome (IBS)   . Constipation   . Colonic polyp   . Diverticulosis   . Bradycardia     s/p PPM  . Hemorrhoids   . Arthritis   . Cataract     Past Surgical History  Procedure Laterality Date  . Pacemaker insertion  2010    implanted by Dr Doreatha Lew ([facility])  . Colonoscopy       Current Outpatient Prescriptions    Medication Sig Dispense Refill  . amiodarone (PACERONE) 200 MG tablet TAKE 1/2 BY MOUTH DAILY 45 tablet 0  . Ascorbic Acid (VITAMIN C) 1000 MG tablet Take 1,000 mg by mouth daily.      Marland Kitchen aspirin 81 MG tablet Take 81 mg by mouth daily.      . Bromfenac Sodium (BROMDAY OP) Apply to eye.      . calcium carbonate 200 MG capsule Take 600 mg by mouth daily.     . Calcium Carbonate Antacid (TUMS E-X PO) Take 1 capsule by mouth. As needed    . cholecalciferol (VITAMIN D) 1000 UNITS tablet Take 1,000 Units by mouth daily.      . fish oil-omega-3 fatty acids 1000 MG capsule Take 1 g by mouth daily.     Marland Kitchen levothyroxine (SYNTHROID, LEVOTHROID) 50 MCG tablet Take 45mcg by mouth and 75 mcg by mouth on alternate days    . Multiple Vitamin (MULTIVITAMIN) capsule Take 1 capsule by mouth daily.      . polyethylene glycol powder (MIRALAX) powder Two capfuls at bedtime    . Probiotic Product (ALIGN PO) Take 1 capsule by mouth daily.     . temazepam (RESTORIL) 30 MG capsule Take 30 mg by mouth at bedtime as needed for sleep.    . verapamil (CALAN-SR) 240  MG CR tablet Take 1 tablet (240 mg total) by mouth at bedtime. 90 tablet 4  . warfarin (COUMADIN) 5 MG tablet Take as directed by coumadin clinic (Patient taking differently: Take 5 mg by mouth. Take 5 mg Sunday, Monday, Wednesday, Friday and take 2.5 mg all other days) 90 tablet 1  . metoprolol succinate (TOPROL-XL) 50 MG 24 hr tablet Take 1 tablet (50 mg total) by mouth daily. 90 tablet 6   No current facility-administered medications for this visit.    Allergies:   Review of patient's allergies indicates no known allergies.    Social History:  The patient  reports that she quit smoking about 34 years ago. Her smoking use included Cigarettes. She has never used smokeless tobacco. She reports that she drinks about 8.4 oz of alcohol per week. She reports that she does not use illicit drugs.   Family History:  The patient's family history includes Diabetes in  her paternal grandfather; Heart attack in her father; Heart disease in her brother; Heart failure in her mother; Lung cancer in her son. There is no history of Colon cancer, Kidney disease, Liver disease, or Esophageal cancer.    ROS:  Please see the history of present illness.   Otherwise, review of systems are positive for none.   All other systems are reviewed and negative.    PHYSICAL EXAM: VS:  BP 108/70 mmHg  Pulse 68  Ht 5\' 1"  (1.549 m)  Wt 121 lb 3.2 oz (54.976 kg)  BMI 22.91 kg/m2 , BMI Body mass index is 22.91 kg/(m^2). GEN: Well nourished, well developed, in no acute distress HEENT: normal Neck: no JVD, carotid bruits, or masses Cardiac: RRR; no murmurs, rubs, or gallops,no edema  Respiratory:  clear to auscultation bilaterally, normal work of breathing GI: soft, nontender, nondistended, + BS MS: no deformity or atrophy Skin: warm and dry, no rash Neuro:  Strength and sensation are intact Psych: euthymic mood, full affect   EKG:  EKG is not ordered today.    Recent Labs: No results found for requested labs within last 365 days.    Lipid Panel No results found for: CHOL, TRIG, HDL, CHOLHDL, VLDL, LDLCALC, LDLDIRECT    Wt Readings from Last 3 Encounters:  04/16/14 121 lb 3.2 oz (54.976 kg)  12/10/13 119 lb (53.978 kg)  07/24/13 120 lb (54.432 kg)         ASSESSMENT AND PLAN:  1. Paroxysmal atrial fibrillation, on warfarin and amiodarone 2. tachybradycardia syndrome with dual-chamber pacemaker 3. Benign hypertensive heart disease without heart failure 4. chronic insomnia, improved on temazepam   Current medicines are reviewed at length with the patient today.  The patient does not have concerns regarding medicines.  The following changes have been made:  no change    Orders Placed This Encounter  Procedures  . DG Chest 2 View     Disposition: Continue on same medication.  We are going to arrange for a chest x-ray to follow-up on her chronic  amiodarone usage. Recheck in 4 months for office visit and EKG The patient states that Dr. Dagmar Hait has been monitoring her thyroid function   Signed, Darlin Coco, MD  04/16/2014 5:49 PM    [address], [place], [place]  [address] Phone: [phone]; Fax: [phone]

## 2014-04-29 ENCOUNTER — Ambulatory Visit (INDEPENDENT_AMBULATORY_CARE_PROVIDER_SITE_OTHER): Payer: Medicare Other | Admitting: Internal Medicine

## 2014-04-29 ENCOUNTER — Encounter: Payer: Self-pay | Admitting: Internal Medicine

## 2014-04-29 ENCOUNTER — Ambulatory Visit (INDEPENDENT_AMBULATORY_CARE_PROVIDER_SITE_OTHER): Payer: Medicare Other | Admitting: *Deleted

## 2014-04-29 ENCOUNTER — Ambulatory Visit
Admission: RE | Admit: 2014-04-29 | Discharge: 2014-04-29 | Disposition: A | Discharge status: Home / Self Care | Payer: Medicare Other | Source: Ambulatory Visit | Attending: Cardiology | Admitting: Cardiology

## 2014-04-29 VITALS — BP 134/60 | HR 64 | Ht 61.5 in | Wt 122.6 lb

## 2014-04-29 DIAGNOSIS — I4891 Unspecified atrial fibrillation: Secondary | ICD-10-CM

## 2014-04-29 DIAGNOSIS — I495 Sick sinus syndrome: Secondary | ICD-10-CM

## 2014-04-29 DIAGNOSIS — I480 Paroxysmal atrial fibrillation: Secondary | ICD-10-CM

## 2014-04-29 DIAGNOSIS — Z79899 Other long term (current) drug therapy: Secondary | ICD-10-CM

## 2014-04-29 DIAGNOSIS — Z5181 Encounter for therapeutic drug level monitoring: Secondary | ICD-10-CM

## 2014-04-29 LAB — MDC_IDC_ENUM_SESS_TYPE_INCLINIC
Brady Statistic AP VP Percent: 0.04 %
Brady Statistic AP VS Percent: 97.78 %
Brady Statistic AS VS Percent: 2.16 %
Brady Statistic RA Percent Paced: 97.82 %
Brady Statistic RV Percent Paced: 0.06 %
Date Time Interrogation Session: 20160321165207
Lead Channel Impedance Value: 504 Ohm
MDC IDC MSMT BATTERY VOLTAGE: 2.99 V
MDC IDC MSMT LEADCHNL RA PACING THRESHOLD AMPLITUDE: 0.5 V
MDC IDC MSMT LEADCHNL RA PACING THRESHOLD PULSEWIDTH: 0.4 ms
MDC IDC MSMT LEADCHNL RA SENSING INTR AMPL: 1.0161
MDC IDC MSMT LEADCHNL RV IMPEDANCE VALUE: 432 Ohm
MDC IDC MSMT LEADCHNL RV PACING THRESHOLD AMPLITUDE: 1 V
MDC IDC MSMT LEADCHNL RV PACING THRESHOLD PULSEWIDTH: 0.4 ms
MDC IDC MSMT LEADCHNL RV SENSING INTR AMPL: 10.0416
MDC IDC SET LEADCHNL RA PACING AMPLITUDE: 2 V
MDC IDC SET LEADCHNL RV PACING AMPLITUDE: 2.5 V
MDC IDC SET LEADCHNL RV PACING PULSEWIDTH: 0.4 ms
MDC IDC SET LEADCHNL RV SENSING SENSITIVITY: 0.9 mV
MDC IDC SET ZONE DETECTION INTERVAL: 350 ms
MDC IDC STAT BRADY AS VP PERCENT: 0.02 %
Zone Setting Detection Interval: 400 ms

## 2014-04-29 LAB — POCT INR: INR: 3

## 2014-04-29 NOTE — Progress Notes (Signed)
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 female who presents today for routine electrophysiology followup.  Since last being seen in our clinic, the patient reports doing very well. She remains very active at [employer].  She exercises regularly. Today, she denies symptoms of palpitations, chest pain, shortness of breath,  lower extremity edema, dizziness, presyncope, or syncope.  The patient is otherwise without complaint today.   Past Medical History  Diagnosis Date  . Atrial fibrillation   . Hypertension   . Hypothyroidism   . Irritable bowel syndrome (IBS)   . Constipation   . Colonic polyp   . Diverticulosis   . Bradycardia     s/p PPM  . Hemorrhoids   . Arthritis   . Cataract    Past Surgical History  Procedure Laterality Date  . Pacemaker insertion  2010    implanted by Dr Doreatha Lew ([facility])  . Colonoscopy      Current Outpatient Prescriptions  Medication Sig Dispense Refill  . amiodarone (PACERONE) 200 MG tablet TAKE 1/2 BY MOUTH DAILY (Patient taking differently: TAKE 1/2 TABLET BY MOUTH DAILY) 45 tablet 0  . Ascorbic Acid (VITAMIN C) 1000 MG tablet Take 1,000 mg by mouth daily.      Marland Kitchen aspirin 81 MG tablet Take 81 mg by mouth daily.      . Bromfenac Sodium (BROMDAY OP) Apply to eye as directed.     . calcium carbonate 200 MG capsule Take 600 mg by mouth daily.     . Calcium Carbonate Antacid (TUMS E-X PO) Take 1 capsule by mouth daily as needed (heartburn).     . cholecalciferol (VITAMIN D) 1000 UNITS tablet Take 1,000 Units by mouth daily.      . fish oil-omega-3 fatty acids 1000 MG capsule Take 1 g by mouth daily.     Marland Kitchen levothyroxine (SYNTHROID, LEVOTHROID) 50 MCG tablet Take 39mcg by mouth every other day and take 75 mcg by mouth on alternate days    . metoprolol succinate (TOPROL-XL) 50 MG 24 hr tablet Take 1 tablet (50 mg total) by mouth daily. 90 tablet 6  . Multiple Vitamin (MULTIVITAMIN) capsule Take 1 capsule by  mouth daily.      . polyethylene glycol powder (MIRALAX) powder Take two capfuls by mouth at bedtime    . Probiotic Product (ALIGN PO) Take 1 capsule by mouth daily.     . temazepam (RESTORIL) 30 MG capsule Take 30 mg by mouth at bedtime as needed for sleep.    . verapamil (CALAN-SR) 240 MG CR tablet Take 1 tablet (240 mg total) by mouth at bedtime. 90 tablet 4  . warfarin (COUMADIN) 5 MG tablet Take as directed by coumadin clinic (Patient taking differently: Take 5 mg by mouth. Take 5 mg Sunday, Monday, Wednesday, Friday and take 2.5 mg all other days) 90 tablet 1   No current facility-administered medications for this visit.    Physical Exam: Filed Vitals:   04/29/14 1245  BP: 134/60  Pulse: 64  Height: 5' 1.5" (1.562 m)  Weight: 122 lb 9.6 oz (55.611 kg)    GEN- The patient is well appearing, alert and oriented x 3 today.   Head- normocephalic, atraumatic Eyes-  Sclera clear, conjunctiva pink Ears- hearing intact Oropharynx- clear Lungs- Clear to ausculation bilaterally, normal work of breathing Chest- pacemaker pocket is well healed Heart- Regular rate and rhythm, no murmurs, rubs or gallops, PMI not laterally displaced GI- soft, NT,  ND, + BS Extremities- no clubbing, cyanosis, or edema  Pacemaker interrogation- reviewed in detail today,  See PACEART report ekg today reveals atrial pacing at 65 bpm, nonspecific ST/T changes  Assessment and Plan:  1. Sick sinus syndrome Normal pacemaker function See Pace Art report No changes today  2. afib Well controlled apprporiately anticoagulated with coumadin chads2vasc score is at least 4.  carelink Return to see Chanetta Marshall NP in 1 year Follow-up with Dr Mare Ferrari as scheduled

## 2014-04-29 NOTE — Patient Instructions (Signed)
Remote monitoring is used to monitor your Pacemaker or ICD from home. This monitoring reduces the number of office visits required to check your device to one time per year. It allows [place] to keep an eye on the functioning of your device to ensure it is working properly. You are scheduled for a device check from home on  07/29/2014. You may send your transmission at any time that day. If you have a wireless device, the transmission will be sent automatically. After your physician reviews your transmission, you will receive a postcard with your next transmission date.   Your physician wants you to follow-up in: 95 month with Chanetta Marshall NP. You will receive a reminder letter in the mail two months in advance. If you don't receive a letter, please call our office to schedule the follow-up appointment.

## 2014-05-01 ENCOUNTER — Telehealth: Payer: Self-pay | Admitting: Cardiology

## 2014-05-08 NOTE — Telephone Encounter (Signed)
Patient aware of chest xray

## 2014-05-13 ENCOUNTER — Ambulatory Visit
Admission: RE | Admit: 2014-05-13 | Discharge: 2014-05-13 | Disposition: A | Discharge status: Home / Self Care | Payer: Medicare Other | Source: Ambulatory Visit

## 2014-05-13 DIAGNOSIS — Z1231 Encounter for screening mammogram for malignant neoplasm of breast: Secondary | ICD-10-CM

## 2014-05-21 ENCOUNTER — Encounter: Payer: Self-pay | Admitting: Internal Medicine

## 2014-05-21 ENCOUNTER — Ambulatory Visit: Payer: Medicare Other | Admitting: Internal Medicine

## 2014-05-21 ENCOUNTER — Telehealth: Payer: Self-pay

## 2014-05-21 ENCOUNTER — Ambulatory Visit (INDEPENDENT_AMBULATORY_CARE_PROVIDER_SITE_OTHER): Payer: Medicare Other | Admitting: Internal Medicine

## 2014-05-21 VITALS — BP 110/60 | HR 61 | Ht 61.5 in | Wt 123.0 lb

## 2014-05-21 DIAGNOSIS — Z86010 Personal history of colonic polyps: Secondary | ICD-10-CM

## 2014-05-21 DIAGNOSIS — K6289 Other specified diseases of anus and rectum: Secondary | ICD-10-CM

## 2014-05-21 DIAGNOSIS — R32 Unspecified urinary incontinence: Secondary | ICD-10-CM | POA: Diagnosis not present

## 2014-05-21 DIAGNOSIS — K5900 Constipation, unspecified: Secondary | ICD-10-CM

## 2014-05-21 DIAGNOSIS — Z7901 Long term (current) use of anticoagulants: Secondary | ICD-10-CM | POA: Diagnosis not present

## 2014-05-21 DIAGNOSIS — I480 Paroxysmal atrial fibrillation: Secondary | ICD-10-CM

## 2014-05-21 NOTE — Patient Instructions (Signed)
You have been scheduled for a colonoscopy. Please follow written instructions given to you at your visit today.  Please pick up your prep supplies at the pharmacy within the next 1-3 days. If you use inhalers (even only as needed), please bring them with you on the day of your procedure.   

## 2014-05-21 NOTE — Telephone Encounter (Signed)
She should leave off her warfarin for 4 days

## 2014-05-21 NOTE — Telephone Encounter (Signed)
  05/21/2014   RE: Kathryn Gibson DOB: 07-25-1933 MRN: [id number]   Dear Dr. Mare Ferrari,    We have scheduled the above patient for an endoscopic procedure. Our records show that she is on anticoagulation therapy.   Please advise as to how long the patient may come off her therapy of Coumadin prior to the procedure, which is scheduled for 06/12/2014.  Please fax back/ or route the completed form to [place] at [phone].   Sincerely,    Phillis Haggis

## 2014-05-21 NOTE — Progress Notes (Signed)
HISTORY OF PRESENT ILLNESS:  Kathryn Gibson is a 81 y.o. female  with multiple medical problems as listed below including prior pacemaker placement and history of atrial fibrillation for which she is on chronic Coumadin. She also has a history of chronic constipation and adenomatous colon polyps for which she has been followed in this office. She has been on a number of agents for her chronic constipation which continues. She wishes to discuss this. She also reports intermittent incontinent episodes over the past year without warning. Finally, during routine evaluation with her gynecologist she had significant pain on rectal exam. Otherwise, she complains of no rectal pain with defecation or otherwise. She has undergone multiple previous colonoscopies elsewhere and here. Her last colonoscopy was May 2013. 3 polyps at that time including one with high-grade dysplasia. She also had colon polyp with high-grade dysplasia in 2010.Marland Kitchen Follow-up in 3 years recommended. Her INR has been stable on Coumadin. She currently uses 2 doses of MiraLAX daily. She has noticed that she needs more with time to achieve the desired effect. She has not had problems with rectal bleeding. Her overall general health has been stable over the past year including cardiac-wise for which she was recently evaluated by Dr. Mare Ferrari  REVIEW OF SYSTEMS:  All non-GI ROS negative except for the right as, visual change  Past Medical History  Diagnosis Date  . Atrial fibrillation   . Hypertension   . Hypothyroidism   . Irritable bowel syndrome (IBS)   . Constipation   . Colonic polyp     adenomatous  . Diverticulosis   . Bradycardia     s/p PPM  . Hemorrhoids   . Arthritis   . Cataract     Past Surgical History  Procedure Laterality Date  . Pacemaker insertion  2010    implanted by Dr Doreatha Lew ([facility])  . Colonoscopy      Social History Kathryn Gibson  reports that she quit smoking about 34 years ago. Her smoking use  included Cigarettes. She has never used smokeless tobacco. She reports that she drinks about 8.4 oz of alcohol per week. She reports that she does not use illicit drugs.  family history includes Diabetes in her paternal grandfather; Heart attack in her father; Heart disease in her brother; Heart failure in her mother; Lung cancer in her son. There is no history of Colon cancer, Kidney disease, Liver disease, or Esophageal cancer.  No Known Allergies     PHYSICAL EXAMINATION: Vital signs: BP 110/60 mmHg  Pulse 61  Ht 5' 1.5" (1.562 m)  Wt 123 lb (55.792 kg)  BMI 22.87 kg/m2  Constitutional: generally well-appearing, no acute distress Psychiatric: alert and oriented x3, cooperative Eyes: extraocular movements intact, anicteric, conjunctiva pink Mouth: oral pharynx moist, no lesions Neck: supple no lymphadenopathy Cardiovascular: heart regular rate and rhythm today, no murmur Lungs: clear to auscultation bilaterally Abdomen: soft, nontender, nondistended, no obvious ascites, no peritoneal signs, normal bowel sounds, no organomegaly Rectal: Deferred until colonoscopy Extremities: no lower extremity edema bilaterally Skin: no lesions on visible extremities Neuro: No focal deficits.   ASSESSMENT:  #1. Constipation. Ongoing problem #2. Intermittent fecal incontinence. New problem #3. Rectal pain on digital exam. Etiology unclear. Rule out stricture #4. Multiple medical problems. Stable #5. Chronic anticoagulation therapy #6. History of adenomatous colon polyps including high-grade dysplasia. Due for surveillance. Medically fit without contraindication   PLAN:  #1. Continue MiraLAX. We discussed titration techniques #2. Recommend protective undergarments for fecal incontinence episodes #3.  We will examine rectum at the time of colonoscopy with sedation #4. Management of anticoagulation for colonoscopy. Recommended to hold 5 days prior to the procedure with anticipated resumption  post procedure. We will inform her cardiologist to confirm acceptability #5. Colonoscopy with possible polypectomy. The patient is high risk given her age and comorbidities and the need to address anticoagulation.The nature of the procedure, as well as the risks, benefits, and alternatives were carefully and thoroughly reviewed with the patient. Ample time for discussion and questions allowed. The patient understood, was satisfied, and agreed to proceed.

## 2014-05-22 ENCOUNTER — Encounter: Payer: Self-pay | Admitting: Internal Medicine

## 2014-05-22 NOTE — Telephone Encounter (Signed)
Spoke with patient and told that, per Dr. Mare Ferrari, she should hold her Warfarin for 4 days prior to her procedure.  Patient agreed.

## 2014-05-27 ENCOUNTER — Ambulatory Visit (INDEPENDENT_AMBULATORY_CARE_PROVIDER_SITE_OTHER): Payer: Medicare Other | Admitting: *Deleted

## 2014-05-27 ENCOUNTER — Other Ambulatory Visit: Payer: Self-pay | Admitting: Cardiology

## 2014-05-27 DIAGNOSIS — I4891 Unspecified atrial fibrillation: Secondary | ICD-10-CM

## 2014-05-27 DIAGNOSIS — Z5181 Encounter for therapeutic drug level monitoring: Secondary | ICD-10-CM | POA: Diagnosis not present

## 2014-05-27 DIAGNOSIS — I480 Paroxysmal atrial fibrillation: Secondary | ICD-10-CM | POA: Diagnosis not present

## 2014-05-27 LAB — POCT INR: INR: 2.1

## 2014-05-27 NOTE — Patient Instructions (Signed)
Last dose of coumadin on 06/07/14 Once approved to continue coumadin by GI physician take extra 1/2 tablet for two doses then resume normal dose.

## 2014-05-30 ENCOUNTER — Telehealth: Payer: Self-pay | Admitting: *Deleted

## 2014-05-30 NOTE — Telephone Encounter (Signed)
Pt states she forgot to take her 5mg  of coumadin last night and states she has just taken it now at 9:48am. Instructed to take her regular dose of 2.5mg  later tonight . Pt states she will take it at midnight. Instructed to continue her dose as instructed and she states understanding.

## 2014-06-06 ENCOUNTER — Telehealth: Payer: Self-pay

## 2014-06-06 NOTE — Telephone Encounter (Signed)
Form signed by  Dr. Mare Ferrari to continue and faxed back to pharmacy   Patient called she can not get her pacerone from [facility] until some one call them to let them know its ok for her to get because of a drug interaction with warfarin please advise

## 2014-06-10 ENCOUNTER — Other Ambulatory Visit: Payer: Self-pay | Admitting: Cardiology

## 2014-06-12 ENCOUNTER — Ambulatory Visit (AMBULATORY_SURGERY_CENTER): Payer: Medicare Other | Admitting: Internal Medicine

## 2014-06-12 ENCOUNTER — Encounter: Payer: Self-pay | Admitting: Internal Medicine

## 2014-06-12 VITALS — BP 144/75 | HR 60 | Temp 96.3°F | Resp 11 | Ht 61.5 in | Wt 122.0 lb

## 2014-06-12 DIAGNOSIS — Z86010 Personal history of colonic polyps: Secondary | ICD-10-CM

## 2014-06-12 MED ORDER — SODIUM CHLORIDE 0.9 % IV SOLN: 500.0000 mL | INTRAVENOUS | Status: DC

## 2014-06-12 NOTE — Progress Notes (Signed)
Large bruised area right upper arm.  Pt does not really know what she did to her arm.  States she may have done  It on vacation.

## 2014-06-12 NOTE — Op Note (Signed)
[facility]  [address] [place], [address]   COLONOSCOPY PROCEDURE REPORT  PATIENT: Kathryn Gibson, Kathryn Gibson  MR#: [id number] BIRTHDATE: Apr 19, 1933 , 81  yrs. old GENDER: female ENDOSCOPIST: Eustace Quail, MD REFERRED JO:[id number] Program Recall PROCEDURE DATE:  06/12/2014 PROCEDURE:   Colonoscopy, surveillance First Screening Colonoscopy - Avg.  risk and is 50 yrs.  old or older - No.  Prior Negative Screening - Now for repeat screening. N/A  History of Adenoma - Now for follow-up colonoscopy & has been > or = to 3 yrs.  Yes hx of adenoma.  Has been 3 or more years since last colonoscopy. ASA CLASS:   Class III INDICATIONS:Surveillance due to prior colonic neoplasia and PH Colon Adenoma. Multiple prior colonoscopies. Had HGD in small polyps 2006 and 2013 MEDICATIONS: Monitored anesthesia care and Propofol 200 mg IV  DESCRIPTION OF PROCEDURE:   After the risks benefits and alternatives of the procedure were thoroughly explained, informed consent was obtained.  The digital rectal exam revealed external hemorrhoids and mild benign stenosis..   The LB [id number] endoscope was introduced through the anus and advanced to the cecum, which was identified by both the appendix and ileocecal valve. No adverse events experienced.   The quality of the prep was good.  (MoviPrep was used)  The instrument was then slowly withdrawn as the colon was fully examined.     COLON FINDINGS: There was severe diverticulosis noted in the left colon.   The examination was otherwise normal.  Retroflexed views revealed internal hemorrhoids. The time to cecum = 5.1 Withdrawal time = 15.6   The scope was withdrawn and the procedure completed. COMPLICATIONS: There were no immediate complications.  ENDOSCOPIC IMPRESSION: 1.   Severe diverticulosis was noted in the left colon 2.   The examination was otherwise normal  RECOMMENDATIONS: 1. Follow up colonoscopy in 5 years  if  medically fit and willing 2. Resume coumadin today and follow up with your coumadin clinic as you normally would  eSigned:  Eustace Quail, MD 06/12/2014 2:42 PM   cc: Prince Solian, MD and The Patient

## 2014-06-12 NOTE — Progress Notes (Signed)
To recovery, report to Myers, RN, VSS. 

## 2014-06-12 NOTE — Patient Instructions (Signed)
YOU HAD AN ENDOSCOPIC PROCEDURE TODAY AT [facility]:   Refer to the procedure report that was given to you for any specific questions about what was found during the examination.  If the procedure report does not answer your questions, please call your gastroenterologist to clarify.  If you requested that your care partner not be given the details of your procedure findings, then the procedure report has been included in a sealed envelope for you to review at your convenience later.  YOU SHOULD EXPECT: Some feelings of bloating in the abdomen. Passage of more gas than usual.  Walking can help get rid of the air that was put into your GI tract during the procedure and reduce the bloating. If you had a lower endoscopy (such as a colonoscopy or flexible sigmoidoscopy) you may notice spotting of blood in your stool or on the toilet paper. If you underwent a bowel prep for your procedure, you may not have a normal bowel movement for a few days.  Please Note:  You might notice some irritation and congestion in your nose or some drainage.  This is from the oxygen used during your procedure.  There is no need for concern and it should clear up in a day or so.  SYMPTOMS TO REPORT IMMEDIATELY:   Following lower endoscopy (colonoscopy or flexible sigmoidoscopy):  Excessive amounts of blood in the stool  Significant tenderness or worsening of abdominal pains  Swelling of the abdomen that is new, acute  Fever of 100F or higher   Following upper endoscopy (EGD)  Vomiting of blood or coffee ground material  New chest pain or pain under the shoulder blades  Painful or persistently difficult swallowing  New shortness of breath  Fever of 100F or higher  Black, tarry-looking stools  For urgent or emergent issues, a gastroenterologist can be reached at any hour by calling [phone].   DIET: Your first meal following the procedure should be a small meal and then it is ok to  progress to your normal diet. Heavy or fried foods are harder to digest and may make you feel nauseous or bloated.  Likewise, meals heavy in dairy and vegetables can increase bloating.  Drink plenty of fluids but you should avoid alcoholic beverages for 24 hours.  ACTIVITY:  You should plan to take it easy for the rest of today and you should NOT DRIVE or use heavy machinery until tomorrow (because of the sedation medicines used during the test).    FOLLOW UP: Our staff will call the number listed on your records the next business day following your procedure to check on you and address any questions or concerns that you may have regarding the information given to you following your procedure. If we do not reach you, we will leave a message.  However, if you are feeling well and you are not experiencing any problems, there is no need to return our call.  We will assume that you have returned to your regular daily activities without incident.  If any biopsies were taken you will be contacted by phone or by letter within the next 1-3 weeks.  Please call us at [phone] if you have not heard about the biopsies in 3 weeks.    SIGNATURES/CONFIDENTIALITY: You and/or your care partner have signed paperwork which will be entered into your electronic medical record.  These signatures attest to the fact that that the information above on your After Visit Summary has  been reviewed and is understood.  Full responsibility of the confidentiality of this discharge information lies with you and/or your care-partner.  Resume Coumadin today and follow up with Coumadin Clinic. Please review diverticulosis and high fiber diet handouts provided.

## 2014-06-13 ENCOUNTER — Telehealth: Payer: Self-pay

## 2014-06-13 NOTE — Telephone Encounter (Signed)
  Follow up Call-  Call back number 06/12/2014  Post procedure Call Back phone  # [phone]  Permission to leave phone message Yes     Patient questions:  Do you have a fever, pain , or abdominal swelling? No. Pain Score  0 *  Have you tolerated food without any problems? Yes.    Have you been able to return to your normal activities? Yes.    Do you have any questions about your discharge instructions: Diet   No. Medications  No. Follow up visit  No.  Do you have questions or concerns about your Care? No.  Actions: * If pain score is 4 or above: No action needed, pain <4.  No problems per the pt. maw

## 2014-06-19 ENCOUNTER — Ambulatory Visit (INDEPENDENT_AMBULATORY_CARE_PROVIDER_SITE_OTHER): Payer: Medicare Other | Admitting: *Deleted

## 2014-06-19 DIAGNOSIS — I480 Paroxysmal atrial fibrillation: Secondary | ICD-10-CM

## 2014-06-19 DIAGNOSIS — I4891 Unspecified atrial fibrillation: Secondary | ICD-10-CM | POA: Diagnosis not present

## 2014-06-19 DIAGNOSIS — Z5181 Encounter for therapeutic drug level monitoring: Secondary | ICD-10-CM | POA: Diagnosis not present

## 2014-06-19 LAB — POCT INR: INR: 1.6

## 2014-06-20 ENCOUNTER — Other Ambulatory Visit: Payer: Self-pay | Admitting: Cardiology

## 2014-07-03 ENCOUNTER — Ambulatory Visit (INDEPENDENT_AMBULATORY_CARE_PROVIDER_SITE_OTHER): Payer: Medicare Other | Admitting: *Deleted

## 2014-07-03 DIAGNOSIS — I480 Paroxysmal atrial fibrillation: Secondary | ICD-10-CM | POA: Diagnosis not present

## 2014-07-03 DIAGNOSIS — I4891 Unspecified atrial fibrillation: Secondary | ICD-10-CM

## 2014-07-03 DIAGNOSIS — Z5181 Encounter for therapeutic drug level monitoring: Secondary | ICD-10-CM | POA: Diagnosis not present

## 2014-07-03 LAB — POCT INR: INR: 2.5

## 2014-07-09 ENCOUNTER — Telehealth: Payer: Self-pay | Admitting: *Deleted

## 2014-07-09 NOTE — Telephone Encounter (Signed)
Spoke with pt and she states that she tripped and fell yesterday afternoon hitting her nose and left knee and states had lot of bleeding from nose. Went to Urgent Care and states had no fractures . Pt states she took 1/2 tablet( 2.5mg ) of coumadin  yesterday and will take 1 tablet (5mg )  today and asked if that would be ok and instructed that is fine. Did instruct her if has any more bleeding needs to see PCP and also if has more bleeding to call back and we will get her in this week to check INR and she states understanding.

## 2014-07-12 ENCOUNTER — Telehealth: Payer: Self-pay | Admitting: *Deleted

## 2014-07-12 ENCOUNTER — Emergency Department (HOSPITAL_COMMUNITY)
Admission: EM | Admit: 2014-07-12 | Discharge: 2014-07-13 | Disposition: A | Discharge status: Home / Self Care | Payer: Medicare Other | Source: Home / Self Care | Attending: Emergency Medicine | Admitting: Emergency Medicine

## 2014-07-12 ENCOUNTER — Encounter (HOSPITAL_COMMUNITY): Payer: Self-pay | Admitting: Emergency Medicine

## 2014-07-12 DIAGNOSIS — Z87891 Personal history of nicotine dependence: Secondary | ICD-10-CM

## 2014-07-12 DIAGNOSIS — I10 Essential (primary) hypertension: Secondary | ICD-10-CM | POA: Diagnosis present

## 2014-07-12 DIAGNOSIS — M1990 Unspecified osteoarthritis, unspecified site: Secondary | ICD-10-CM

## 2014-07-12 DIAGNOSIS — Z79899 Other long term (current) drug therapy: Secondary | ICD-10-CM

## 2014-07-12 DIAGNOSIS — R040 Epistaxis: Secondary | ICD-10-CM | POA: Diagnosis not present

## 2014-07-12 DIAGNOSIS — K5900 Constipation, unspecified: Secondary | ICD-10-CM | POA: Insufficient documentation

## 2014-07-12 DIAGNOSIS — Z7982 Long term (current) use of aspirin: Secondary | ICD-10-CM

## 2014-07-12 DIAGNOSIS — I4891 Unspecified atrial fibrillation: Secondary | ICD-10-CM | POA: Insufficient documentation

## 2014-07-12 DIAGNOSIS — H269 Unspecified cataract: Secondary | ICD-10-CM

## 2014-07-12 DIAGNOSIS — E876 Hypokalemia: Secondary | ICD-10-CM | POA: Diagnosis not present

## 2014-07-12 DIAGNOSIS — Z950 Presence of cardiac pacemaker: Secondary | ICD-10-CM

## 2014-07-12 DIAGNOSIS — Z86010 Personal history of colonic polyps: Secondary | ICD-10-CM

## 2014-07-12 DIAGNOSIS — T45515A Adverse effect of anticoagulants, initial encounter: Secondary | ICD-10-CM | POA: Diagnosis present

## 2014-07-12 DIAGNOSIS — E039 Hypothyroidism, unspecified: Secondary | ICD-10-CM

## 2014-07-12 DIAGNOSIS — D62 Acute posthemorrhagic anemia: Secondary | ICD-10-CM | POA: Diagnosis not present

## 2014-07-12 DIAGNOSIS — I480 Paroxysmal atrial fibrillation: Secondary | ICD-10-CM | POA: Diagnosis present

## 2014-07-12 DIAGNOSIS — Y838 Other surgical procedures as the cause of abnormal reaction of the patient, or of later complication, without mention of misadventure at the time of the procedure: Secondary | ICD-10-CM | POA: Diagnosis not present

## 2014-07-12 DIAGNOSIS — I482 Chronic atrial fibrillation: Secondary | ICD-10-CM | POA: Diagnosis present

## 2014-07-12 DIAGNOSIS — L7622 Postprocedural hemorrhage and hematoma of skin and subcutaneous tissue following other procedure: Secondary | ICD-10-CM | POA: Diagnosis not present

## 2014-07-12 DIAGNOSIS — R001 Bradycardia, unspecified: Secondary | ICD-10-CM | POA: Diagnosis present

## 2014-07-12 DIAGNOSIS — Z7901 Long term (current) use of anticoagulants: Secondary | ICD-10-CM

## 2014-07-12 DIAGNOSIS — D6832 Hemorrhagic disorder due to extrinsic circulating anticoagulants: Secondary | ICD-10-CM | POA: Diagnosis present

## 2014-07-12 LAB — PROTIME-INR
INR: 2.09 — ABNORMAL HIGH (ref 0.00–1.49)
Prothrombin Time: 23.4 seconds — ABNORMAL HIGH (ref 11.6–15.2)

## 2014-07-12 LAB — CBC
HCT: 37 % (ref 36.0–46.0)
HEMOGLOBIN: 12.1 g/dL (ref 12.0–15.0)
MCH: 29.2 pg (ref 26.0–34.0)
MCHC: 32.7 g/dL (ref 30.0–36.0)
MCV: 89.2 fL (ref 78.0–100.0)
PLATELETS: 236 10*3/uL (ref 150–400)
RBC: 4.15 MIL/uL (ref 3.87–5.11)
RDW: 14.7 % (ref 11.5–15.5)
WBC: 6.1 10*3/uL (ref 4.0–10.5)

## 2014-07-12 MED ORDER — BENZOCAINE 20 % MT SOLN
Freq: Once | OROMUCOSAL | Status: AC
  Administered 2014-07-12: 22:00:00 via OROMUCOSAL
  Filled 2014-07-12: qty 57

## 2014-07-12 NOTE — ED Notes (Signed)
Brought in by EMS from [facility] (independent living) with c/o nose bleeding.  Pt reported that she started having nose bleeding at  around 1600 today and has "not stopped since".  Pt reports she's on Coumadin.  Pt denies injury or trauma today---- pt reports she had a fall 4 days ago and was seen at Urgent Care.  Today, pt reported that she just bent over and started nose bleeding.  Pt has used Afrin Nasal Spray but it did not help stop the bleeding.

## 2014-07-12 NOTE — ED Provider Notes (Signed)
CSN: [id number]     Arrival date & time 07/12/14  2031 History   First MD Initiated Contact with Patient 07/12/14 2116     Chief Complaint  Patient presents with  . Epistaxis     (Consider location/radiation/quality/duration/timing/severity/associated sxs/prior Treatment) HPI Comments: Pt with hx of a-fib, on coumdin, fell 5 days ago and hit nose on ground.  States she did not hit her head anywhere else.  No LOC.  No neck or back pain.  No dizziness.  Went to Urgent Care and had x-ray of her nose, was not broken.  Today, when she bent over, her nose started bleeding again.  Has not been able to get the bleeding to stop.  Patient is a 81 y.o. female presenting with nosebleeds.  Epistaxis Associated symptoms: no congestion, no cough, no dizziness, no fever, no headaches and no sneezing     Past Medical History  Diagnosis Date  . Atrial fibrillation   . Hypertension   . Hypothyroidism   . Irritable bowel syndrome (IBS)   . Constipation   . Colonic polyp     adenomatous  . Diverticulosis   . Bradycardia     s/p PPM  . Hemorrhoids   . Arthritis   . Cataract    Past Surgical History  Procedure Laterality Date  . Pacemaker insertion  2010    implanted by Dr Doreatha Lew ([facility])  . Colonoscopy     Family History  Problem Relation Age of Onset  . Heart failure Mother   . Heart attack Father   . Colon cancer Neg Hx   . Diabetes Paternal Grandfather   . Kidney disease Neg Hx   . Liver disease Neg Hx   . Esophageal cancer Neg Hx   . Heart disease Brother   . Lung cancer Son    History  Substance Use Topics  . Smoking status: Former Smoker    Types: Cigarettes    Quit date: 02/09/1980  . Smokeless tobacco: Never Used  . Alcohol Use: 8.4 oz/week    14 Glasses of wine per week   OB History    No data available     Review of Systems  Constitutional: Negative for fever, chills, diaphoresis and fatigue.  HENT: Positive for nosebleeds. Negative for congestion, rhinorrhea and  sneezing.   Eyes: Negative.   Respiratory: Negative for cough, chest tightness and shortness of breath.   Cardiovascular: Negative for chest pain and leg swelling.  Gastrointestinal: Negative for nausea, vomiting, abdominal pain, diarrhea and blood in stool.  Genitourinary: Negative for frequency, hematuria, flank pain and difficulty urinating.  Musculoskeletal: Negative for back pain and arthralgias.  Skin: Negative for rash.  Neurological: Negative for dizziness, speech difficulty, weakness, numbness and headaches.      Allergies  Review of patient's allergies indicates no known allergies.  Home Medications   Prior to Admission medications   Medication Sig Start Date End Date Taking? Authorizing Provider  acetaminophen (TYLENOL) 500 MG tablet Take 500-1,000 mg by mouth every 6 (six) hours as needed for moderate pain or headache.   Yes Historical Provider, MD  amiodarone (PACERONE) 100 MG tablet Take 50 mg by mouth daily.   Yes Historical Provider, MD  Ascorbic Acid (VITAMIN C) 1000 MG tablet Take 1,000 mg by mouth daily.     Yes Historical Provider, MD  aspirin 81 MG tablet Take 81 mg by mouth daily.     Yes Historical Provider, MD  Calcium-Vitamin D (CALTRATE 600 PLUS-VIT D PO) Take  1 tablet by mouth daily.   Yes Historical Provider, MD  cholecalciferol (VITAMIN D) 1000 UNITS tablet Take 1,000 Units by mouth daily.     Yes Historical Provider, MD  levothyroxine (SYNTHROID, LEVOTHROID) 50 MCG tablet Take 58mcg by mouth every other day and take 75 mcg by mouth on alternate days   Yes Historical Provider, MD  LYSINE PO Take 1-2 tablets by mouth daily as needed (fever blister).   Yes Historical Provider, MD  metoprolol succinate (TOPROL-XL) 25 MG 24 hr tablet Take 25 mg by mouth daily.   Yes Historical Provider, MD  Multiple Vitamin (MULTIVITAMIN) capsule Take 1 capsule by mouth daily.     Yes Historical Provider, MD  Omega-3 Fatty Acids (FISH OIL) 1200 MG CAPS Take 1 capsule by mouth  daily. Mega Red.   Yes Historical Provider, MD  Polyethyl Glycol-Propyl Glycol (SYSTANE OP) Apply 1-2 drops to eye daily.   Yes Historical Provider, MD  polyethylene glycol powder (MIRALAX) powder Take two capfuls by mouth at bedtime   Yes Historical Provider, MD  Probiotic Product (ALIGN PO) Take 1 capsule by mouth daily.    Yes Historical Provider, MD  temazepam (RESTORIL) 30 MG capsule Take 30 mg by mouth at bedtime.    Yes Historical Provider, MD  verapamil (CALAN-SR) 240 MG CR tablet TAKE 1 BY MOUTH AT BEDTIME 05/27/14  Yes Darlin Coco, MD  warfarin (COUMADIN) 5 MG tablet Take as directed by coumadin clinic Patient taking differently: Take 5 mg by mouth. Take 5 mg Sunday, Monday, Wednesday, Friday and take 2.5 mg all other days 01/28/14  Yes Darlin Coco, MD  amiodarone (PACERONE) 200 MG tablet TAKE 1/2 BY MOUTH DAILY Patient not taking: Reported on 07/12/2014 04/12/14   Darlin Coco, MD  amiodarone (PACERONE) 200 MG tablet TAKE 1/2 BY MOUTH DAILY Patient not taking: Reported on 07/12/2014 06/20/14   Darlin Coco, MD  metoprolol succinate (TOPROL-XL) 50 MG 24 hr tablet Take 1 tablet (50 mg total) by mouth daily. Patient not taking: Reported on 07/12/2014 04/16/14   Darlin Coco, MD   BP 154/76 mmHg  Pulse 70  Temp(Src) 98 F (36.7 C) (Oral)  Resp 18  SpO2 96% Physical Exam  Constitutional: She is oriented to person, place, and time. She appears well-developed and well-nourished.  HENT:  Head: Normocephalic and atraumatic.  Bleeding from left nare and down posterior pharnyx  Eyes: Pupils are equal, round, and reactive to light.  Neck: Normal range of motion. Neck supple.  Cardiovascular: Normal rate, regular rhythm and normal heart sounds.   Pulmonary/Chest: Effort normal and breath sounds normal. No respiratory distress. She has no wheezes. She has no rales. She exhibits no tenderness.  Abdominal: Soft. Bowel sounds are normal. There is no tenderness. There is no rebound  and no guarding.  Musculoskeletal: Normal range of motion. She exhibits no edema.  Lymphadenopathy:    She has no cervical adenopathy.  Neurological: She is alert and oriented to person, place, and time.  Skin: Skin is warm and dry. No rash noted.  Psychiatric: She has a normal mood and affect.    ED Course  Procedures (including critical care time) Labs Review Labs Reviewed  PROTIME-INR - Abnormal; Notable for the following:    Prothrombin Time 23.4 (*)    INR 2.09 (*)    All other components within normal limits  CBC    Imaging Review No results found.   EKG Interpretation None      MDM   Final diagnoses:  Epistaxis    I initially placed a rhino rocket, but this did not stop the bleeding.  This was removed and surgifoam was packed in left nares.  Pt monitored for over an hour, and no further bleeding identified.  Pt advised to stop her coumadin for next 2 days and start it back on Monday.  Given referral to ENT.  Pt says that she did not hit her head, other than nose when she fell and fall was 5 days ago.  Denies any headaches, vomiting, or dizziness.  Given this, I did not feel that head CT indicated.    Malvin Johns, MD 07/13/14 [phone]

## 2014-07-12 NOTE — ED Notes (Signed)
Cart at bedside.

## 2014-07-12 NOTE — Telephone Encounter (Signed)
Patient scheduled for bilateral Ptosis Repair  Ok to stop ASA and Coumadin 5 days prio Will fax to Dr Katy Fitch [phone]

## 2014-07-12 NOTE — Telephone Encounter (Signed)
Pt called stating that she has another nose bleed  now had fall on 07/08/2014  and  is asking if she should hold coumadin Instructed that I cannot instruct  her to hold coumadin without knowing INR results and that this nurse  recommends that since is having another nose bleed that she needs to have this checked at either Urgent Care or ER and she states understanding.

## 2014-07-12 NOTE — ED Notes (Signed)
Per EMS, pt from [facility], reports nose bleeding all day today.  Started when she bent over to pick something up.  Pt is on blood thinner.

## 2014-07-13 NOTE — Discharge Instructions (Signed)
Stop your coumadin for the next 2 days.  Restart on Monday.  Nosebleed Nosebleeds can be caused by many conditions, including trauma, infections, polyps, foreign bodies, dry mucous membranes or climate, medicines, and air conditioning. Most nosebleeds occur in the front of the nose. Because of this location, most nosebleeds can be controlled by pinching the nostrils gently and continuously for at least 10 to 20 minutes. The long, continuous pressure allows enough time for the blood to clot. If pressure is released during that 10 to 20 minute time period, the process may have to be started again. The nosebleed may stop by itself or quit with pressure, or it may need concentrated heating (cautery) or pressure from packing. HOME CARE INSTRUCTIONS   If your nose was packed, try to maintain the pack inside until your health care provider removes it. If a gauze pack was used and it starts to fall out, gently replace it or cut the end off. Do not cut if a balloon catheter was used to pack the nose. Otherwise, do not remove unless instructed.  Avoid blowing your nose for 12 hours after treatment. This could dislodge the pack or clot and start the bleeding again.  If the bleeding starts again, sit up and bend forward, gently pinching the front half of your nose continuously for 20 minutes.  If bleeding was caused by dry mucous membranes, use over-the-counter saline nasal spray or gel. This will keep the mucous membranes moist and allow them to heal. If you must use a lubricant, choose the water-soluble variety. Use it only sparingly and not within several hours of lying down.  Do not use petroleum jelly or mineral oil, as these may drip into the lungs and cause serious problems.  Maintain humidity in your home by using less air conditioning or by using a humidifier.  Do not use aspirin or medicines which make bleeding more likely. Your health care provider can give you recommendations on this.  Resume  normal activities as you are able, but try to avoid straining, lifting, or bending at the waist for several days.  If the nosebleeds become recurrent and the cause is unknown, your health care provider may suggest laboratory tests. SEEK MEDICAL CARE IF: You have a fever. SEEK IMMEDIATE MEDICAL CARE IF:   Bleeding recurs and cannot be controlled.  There is unusual bleeding from or bruising on other parts of the body.  Nosebleeds continue.  There is any worsening of the condition which originally brought you in.  You become light-headed, feel faint, become sweaty, or vomit blood. MAKE SURE YOU:   Understand these instructions.  Will watch your condition.  Will get help right away if you are not doing well or get worse. Document Released: 11/04/2004 Document Revised: 06/11/2013 Document Reviewed: 12/26/2008 [facility] Patient Information 2015 [place], [place]. This information is not intended to replace advice given to you by your health care provider. Make sure you discuss any questions you have with your health care provider.

## 2014-07-14 ENCOUNTER — Inpatient Hospital Stay (HOSPITAL_COMMUNITY): Payer: Medicare Other | Admitting: Anesthesiology

## 2014-07-14 ENCOUNTER — Encounter (HOSPITAL_COMMUNITY)
Admission: EM | Disposition: A | Discharge status: Skilled Nursing Facility | Payer: Self-pay | Source: Home / Self Care | Attending: Internal Medicine

## 2014-07-14 ENCOUNTER — Inpatient Hospital Stay (HOSPITAL_COMMUNITY): Payer: Medicare Other

## 2014-07-14 ENCOUNTER — Emergency Department (HOSPITAL_COMMUNITY): Payer: Medicare Other | Admitting: Anesthesiology

## 2014-07-14 ENCOUNTER — Encounter (HOSPITAL_COMMUNITY): Payer: Self-pay | Admitting: Emergency Medicine

## 2014-07-14 ENCOUNTER — Inpatient Hospital Stay (HOSPITAL_COMMUNITY)
Admission: EM | Admit: 2014-07-14 | Discharge: 2014-07-22 | DRG: 133 | Disposition: A | Discharge status: Skilled Nursing Facility | Payer: Medicare Other | Attending: Internal Medicine | Admitting: Internal Medicine

## 2014-07-14 ENCOUNTER — Other Ambulatory Visit (HOSPITAL_COMMUNITY): Payer: Self-pay

## 2014-07-14 DIAGNOSIS — E876 Hypokalemia: Secondary | ICD-10-CM | POA: Diagnosis present

## 2014-07-14 DIAGNOSIS — I5189 Other ill-defined heart diseases: Secondary | ICD-10-CM | POA: Diagnosis present

## 2014-07-14 DIAGNOSIS — I480 Paroxysmal atrial fibrillation: Secondary | ICD-10-CM | POA: Diagnosis present

## 2014-07-14 DIAGNOSIS — D6832 Hemorrhagic disorder due to extrinsic circulating anticoagulants: Secondary | ICD-10-CM | POA: Diagnosis present

## 2014-07-14 DIAGNOSIS — Z87891 Personal history of nicotine dependence: Secondary | ICD-10-CM | POA: Diagnosis not present

## 2014-07-14 DIAGNOSIS — I10 Essential (primary) hypertension: Secondary | ICD-10-CM | POA: Diagnosis present

## 2014-07-14 DIAGNOSIS — E039 Hypothyroidism, unspecified: Secondary | ICD-10-CM

## 2014-07-14 DIAGNOSIS — I519 Heart disease, unspecified: Secondary | ICD-10-CM

## 2014-07-14 DIAGNOSIS — T148 Other injury of unspecified body region: Secondary | ICD-10-CM | POA: Diagnosis not present

## 2014-07-14 DIAGNOSIS — Z79899 Other long term (current) drug therapy: Secondary | ICD-10-CM | POA: Diagnosis not present

## 2014-07-14 DIAGNOSIS — Z7901 Long term (current) use of anticoagulants: Secondary | ICD-10-CM | POA: Diagnosis not present

## 2014-07-14 DIAGNOSIS — D62 Acute posthemorrhagic anemia: Secondary | ICD-10-CM | POA: Diagnosis not present

## 2014-07-14 DIAGNOSIS — R040 Epistaxis: Secondary | ICD-10-CM | POA: Diagnosis present

## 2014-07-14 DIAGNOSIS — L7622 Postprocedural hemorrhage and hematoma of skin and subcutaneous tissue following other procedure: Secondary | ICD-10-CM | POA: Diagnosis not present

## 2014-07-14 DIAGNOSIS — K5900 Constipation, unspecified: Secondary | ICD-10-CM | POA: Diagnosis present

## 2014-07-14 DIAGNOSIS — I495 Sick sinus syndrome: Secondary | ICD-10-CM

## 2014-07-14 DIAGNOSIS — T148XXA Other injury of unspecified body region, initial encounter: Secondary | ICD-10-CM

## 2014-07-14 DIAGNOSIS — Z7982 Long term (current) use of aspirin: Secondary | ICD-10-CM | POA: Diagnosis not present

## 2014-07-14 DIAGNOSIS — R194 Change in bowel habit: Secondary | ICD-10-CM | POA: Diagnosis present

## 2014-07-14 DIAGNOSIS — Z950 Presence of cardiac pacemaker: Secondary | ICD-10-CM | POA: Diagnosis not present

## 2014-07-14 DIAGNOSIS — Z86010 Personal history of colonic polyps: Secondary | ICD-10-CM | POA: Diagnosis not present

## 2014-07-14 DIAGNOSIS — M7981 Nontraumatic hematoma of soft tissue: Secondary | ICD-10-CM | POA: Diagnosis not present

## 2014-07-14 DIAGNOSIS — S301XXA Contusion of abdominal wall, initial encounter: Secondary | ICD-10-CM | POA: Diagnosis not present

## 2014-07-14 DIAGNOSIS — E038 Other specified hypothyroidism: Secondary | ICD-10-CM | POA: Diagnosis present

## 2014-07-14 DIAGNOSIS — Y838 Other surgical procedures as the cause of abnormal reaction of the patient, or of later complication, without mention of misadventure at the time of the procedure: Secondary | ICD-10-CM | POA: Diagnosis not present

## 2014-07-14 DIAGNOSIS — I482 Chronic atrial fibrillation, unspecified: Secondary | ICD-10-CM | POA: Diagnosis present

## 2014-07-14 DIAGNOSIS — IMO0002 Reserved for concepts with insufficient information to code with codable children: Secondary | ICD-10-CM | POA: Diagnosis present

## 2014-07-14 DIAGNOSIS — T45515A Adverse effect of anticoagulants, initial encounter: Secondary | ICD-10-CM | POA: Diagnosis present

## 2014-07-14 DIAGNOSIS — R001 Bradycardia, unspecified: Secondary | ICD-10-CM | POA: Diagnosis present

## 2014-07-14 HISTORY — PX: NASAL ENDOSCOPY WITH EPISTAXIS CONTROL: SHX5664

## 2014-07-14 HISTORY — PX: RADIOLOGY WITH ANESTHESIA: SHX6223

## 2014-07-14 LAB — CBC
HCT: 33.7 % — ABNORMAL LOW (ref 36.0–46.0)
HCT: 35.5 % — ABNORMAL LOW (ref 36.0–46.0)
HCT: 26.4 % — ABNORMAL LOW (ref 36.0–46.0)
HEMOGLOBIN: 12 g/dL (ref 12.0–15.0)
Hemoglobin: 11.3 g/dL — ABNORMAL LOW (ref 12.0–15.0)
Hemoglobin: 8.6 g/dL — ABNORMAL LOW (ref 12.0–15.0)
MCH: 29.4 pg (ref 26.0–34.0)
MCH: 29.9 pg (ref 26.0–34.0)
MCH: 29.4 pg (ref 26.0–34.0)
MCHC: 33.5 g/dL (ref 30.0–36.0)
MCHC: 33.8 g/dL (ref 30.0–36.0)
MCHC: 32.6 g/dL (ref 30.0–36.0)
MCV: 87.5 fL (ref 78.0–100.0)
MCV: 88.3 fL (ref 78.0–100.0)
MCV: 90.1 fL (ref 78.0–100.0)
PLATELETS: 220 10*3/uL (ref 150–400)
PLATELETS: 186 10*3/uL (ref 150–400)
Platelets: 246 10*3/uL (ref 150–400)
RBC: 3.85 MIL/uL — ABNORMAL LOW (ref 3.87–5.11)
RBC: 4.02 MIL/uL (ref 3.87–5.11)
RBC: 2.93 MIL/uL — ABNORMAL LOW (ref 3.87–5.11)
RDW: 14.7 % (ref 11.5–15.5)
RDW: 14.8 % (ref 11.5–15.5)
RDW: 15 % (ref 11.5–15.5)
WBC: 7.3 10*3/uL (ref 4.0–10.5)
WBC: 9.1 10*3/uL (ref 4.0–10.5)
WBC: 11.8 10*3/uL — AB (ref 4.0–10.5)

## 2014-07-14 LAB — PREPARE RBC (CROSSMATCH)

## 2014-07-14 LAB — PROTIME-INR
INR: 1.73 — ABNORMAL HIGH (ref 0.00–1.49)
INR: 1.58 — ABNORMAL HIGH (ref 0.00–1.49)
PROTHROMBIN TIME: 20.2 s — AB (ref 11.6–15.2)
PROTHROMBIN TIME: 18.9 s — AB (ref 11.6–15.2)

## 2014-07-14 LAB — POCT ACTIVATED CLOTTING TIME
ACTIVATED CLOTTING TIME: 208 s
Activated Clotting Time: 147 seconds
Activated Clotting Time: 177 seconds

## 2014-07-14 LAB — BASIC METABOLIC PANEL
Anion gap: 9 (ref 5–15)
BUN: 15 mg/dL (ref 6–20)
CO2: 28 mmol/L (ref 22–32)
CREATININE: 0.74 mg/dL (ref 0.44–1.00)
Calcium: 9.2 mg/dL (ref 8.9–10.3)
Chloride: 102 mmol/L (ref 101–111)
GFR calc Af Amer: >60 mL/min (ref >60)
GLUCOSE: 100 mg/dL — AB (ref 65–99)
Potassium: 3.8 mmol/L (ref 3.5–5.1)
Sodium: 139 mmol/L (ref 135–145)

## 2014-07-14 LAB — MAGNESIUM: Magnesium: 1.6 mg/dL — ABNORMAL LOW (ref 1.7–2.4)

## 2014-07-14 LAB — ABO/RH: ABO/RH(D): A POS

## 2014-07-14 SURGERY — CONTROL OF EPISTAXIS, ENDOSCOPIC
Anesthesia: General | Site: Nose

## 2014-07-14 SURGERY — RADIOLOGY WITH ANESTHESIA
Anesthesia: General

## 2014-07-14 MED ORDER — BACITRACIN-NEOMYCIN-POLYMYXIN 400-5-5000 EX OINT
TOPICAL_OINTMENT | CUTANEOUS | Status: AC
  Filled 2014-07-14: qty 1

## 2014-07-14 MED ORDER — PROTAMINE SULFATE 10 MG/ML IV SOLN
INTRAVENOUS | Status: DC | PRN
  Administered 2014-07-14: 7.5 mg via INTRAVENOUS

## 2014-07-14 MED ORDER — WHITE PETROLATUM GEL
Status: AC
  Administered 2014-07-15: 0.2
  Filled 2014-07-14: qty 1

## 2014-07-14 MED ORDER — PROPOFOL 10 MG/ML IV BOLUS
INTRAVENOUS | Status: DC | PRN
  Administered 2014-07-14: 100 mg via INTRAVENOUS

## 2014-07-14 MED ORDER — SUCCINYLCHOLINE CHLORIDE 20 MG/ML IJ SOLN
INTRAMUSCULAR | Status: DC | PRN
  Administered 2014-07-14: 100 mg via INTRAVENOUS

## 2014-07-14 MED ORDER — HEPARIN SODIUM (PORCINE) 1000 UNIT/ML IJ SOLN
INTRAMUSCULAR | Status: DC | PRN
  Administered 2014-07-14: 1000 [IU] via INTRAVENOUS
  Administered 2014-07-14: 500 [IU] via INTRAVENOUS
  Administered 2014-07-14: 1000 [IU] via INTRAVENOUS
  Administered 2014-07-14: 500 [IU] via INTRAVENOUS
  Administered 2014-07-14: 1000 [IU] via INTRAVENOUS

## 2014-07-14 MED ORDER — MEPERIDINE HCL 25 MG/ML IJ SOLN
INTRAMUSCULAR | Status: AC
  Administered 2014-07-14: 12.5 mg via INTRAVENOUS
  Filled 2014-07-14: qty 1

## 2014-07-14 MED ORDER — POLYETHYL GLYCOL-PROPYL GLYCOL 0.4-0.3 % OP SOLN: Freq: Every day | OPHTHALMIC | Status: DC

## 2014-07-14 MED ORDER — ROCURONIUM BROMIDE 100 MG/10ML IV SOLN
INTRAVENOUS | Status: DC | PRN
  Administered 2014-07-14: 20 mg via INTRAVENOUS

## 2014-07-14 MED ORDER — VITAMIN K1 10 MG/ML IJ SOLN
1.0000 mg | Freq: Once | INTRAVENOUS | Status: AC
  Administered 2014-07-14: 1 mg via INTRAVENOUS
  Filled 2014-07-14: qty 0.1

## 2014-07-14 MED ORDER — MEPERIDINE HCL 25 MG/ML IJ SOLN: 6.2500 mg | INTRAMUSCULAR | Status: DC | PRN

## 2014-07-14 MED ORDER — LIDOCAINE HCL (CARDIAC) 20 MG/ML IV SOLN
INTRAVENOUS | Status: AC
  Filled 2014-07-14: qty 5

## 2014-07-14 MED ORDER — ONDANSETRON HCL 4 MG PO TABS: 4.0000 mg | ORAL_TABLET | Freq: Four times a day (QID) | ORAL | Status: DC | PRN

## 2014-07-14 MED ORDER — PROPOFOL 10 MG/ML IV BOLUS
INTRAVENOUS | Status: AC
  Filled 2014-07-14: qty 20

## 2014-07-14 MED ORDER — CEFAZOLIN SODIUM 1-5 GM-% IV SOLN
1.0000 g | Freq: Once | INTRAVENOUS | Status: AC
  Administered 2014-07-14: 1 g via INTRAVENOUS
  Filled 2014-07-14: qty 50

## 2014-07-14 MED ORDER — LIDOCAINE HCL (CARDIAC) 20 MG/ML IV SOLN
INTRAVENOUS | Status: DC | PRN
  Administered 2014-07-14: 60 mg via INTRAVENOUS

## 2014-07-14 MED ORDER — ROCURONIUM BROMIDE 50 MG/5ML IV SOLN
INTRAVENOUS | Status: AC
  Filled 2014-07-14: qty 1

## 2014-07-14 MED ORDER — FENTANYL CITRATE (PF) 100 MCG/2ML IJ SOLN
25.0000 ug | INTRAMUSCULAR | Status: DC | PRN
  Administered 2014-07-14 (×2): 50 ug via INTRAVENOUS

## 2014-07-14 MED ORDER — LIDOCAINE-EPINEPHRINE 1 %-1:100000 IJ SOLN
INTRAMUSCULAR | Status: DC | PRN
  Administered 2014-07-14: 10 mL

## 2014-07-14 MED ORDER — ACETAMINOPHEN 650 MG RE SUPP: 650.0000 mg | Freq: Four times a day (QID) | RECTAL | Status: DC | PRN

## 2014-07-14 MED ORDER — GLYCOPYRROLATE 0.2 MG/ML IJ SOLN
INTRAMUSCULAR | Status: DC | PRN
  Administered 2014-07-14: 0.3 mg via INTRAVENOUS

## 2014-07-14 MED ORDER — BACITRACIN-NEOMYCIN-POLYMYXIN 400-5-5000 EX OINT
TOPICAL_OINTMENT | CUTANEOUS | Status: DC | PRN
  Administered 2014-07-14: 1 via TOPICAL

## 2014-07-14 MED ORDER — FENTANYL CITRATE (PF) 100 MCG/2ML IJ SOLN
INTRAMUSCULAR | Status: AC
  Filled 2014-07-14: qty 2

## 2014-07-14 MED ORDER — SODIUM CHLORIDE 0.9 % IV SOLN: INTRAVENOUS | Status: DC

## 2014-07-14 MED ORDER — SUCCINYLCHOLINE CHLORIDE 20 MG/ML IJ SOLN
INTRAMUSCULAR | Status: AC
  Filled 2014-07-14: qty 1

## 2014-07-14 MED ORDER — FENTANYL CITRATE (PF) 250 MCG/5ML IJ SOLN
INTRAMUSCULAR | Status: AC
  Filled 2014-07-14: qty 5

## 2014-07-14 MED ORDER — ONDANSETRON HCL 4 MG/2ML IJ SOLN: 4.0000 mg | Freq: Four times a day (QID) | INTRAMUSCULAR | Status: DC | PRN

## 2014-07-14 MED ORDER — IOHEXOL 300 MG/ML  SOLN
150.0000 mL | Freq: Once | INTRAMUSCULAR | Status: AC | PRN
  Administered 2014-07-14: 450 mL via INTRAVENOUS

## 2014-07-14 MED ORDER — NITROGLYCERIN 1 MG/10 ML FOR IR/CATH LAB
INTRA_ARTERIAL | Status: AC
  Administered 2014-07-14 (×2): 25 ug
  Filled 2014-07-14: qty 10

## 2014-07-14 MED ORDER — ONDANSETRON HCL 4 MG/2ML IJ SOLN
INTRAMUSCULAR | Status: DC | PRN
  Administered 2014-07-14: 4 mg via INTRAVENOUS

## 2014-07-14 MED ORDER — PROMETHAZINE HCL 25 MG/ML IJ SOLN: 6.2500 mg | INTRAMUSCULAR | Status: DC | PRN

## 2014-07-14 MED ORDER — SODIUM CHLORIDE 0.9 % IV SOLN
INTRAVENOUS | Status: DC | PRN
Start: 2014-07-14 — End: 2014-07-14
  Administered 2014-07-14 (×2): via INTRAVENOUS

## 2014-07-14 MED ORDER — LEVOTHYROXINE SODIUM 100 MCG IV SOLR
25.0000 ug | Freq: Every day | INTRAVENOUS | Status: DC
  Administered 2014-07-15: 25 ug via INTRAVENOUS
  Filled 2014-07-14 (×2): qty 5

## 2014-07-14 MED ORDER — MORPHINE SULFATE 2 MG/ML IJ SOLN
1.0000 mg | INTRAMUSCULAR | Status: DC | PRN
  Filled 2014-07-14: qty 1

## 2014-07-14 MED ORDER — PROPOFOL 10 MG/ML IV BOLUS
INTRAVENOUS | Status: DC | PRN
  Administered 2014-07-14: 70 mg via INTRAVENOUS
  Administered 2014-07-14: 30 mg via INTRAVENOUS

## 2014-07-14 MED ORDER — DEXAMETHASONE SODIUM PHOSPHATE 4 MG/ML IJ SOLN
INTRAMUSCULAR | Status: DC | PRN
  Administered 2014-07-14: 4 mg via INTRAVENOUS

## 2014-07-14 MED ORDER — SODIUM CHLORIDE 0.9 % IJ SOLN
3.0000 mL | Freq: Two times a day (BID) | INTRAMUSCULAR | Status: DC
  Administered 2014-07-15 – 2014-07-22 (×14): 3 mL via INTRAVENOUS

## 2014-07-14 MED ORDER — CEFAZOLIN SODIUM 1-5 GM-% IV SOLN
1.0000 g | Freq: Two times a day (BID) | INTRAVENOUS | Status: DC
  Administered 2014-07-15 – 2014-07-20 (×12): 1 g via INTRAVENOUS
  Filled 2014-07-14 (×14): qty 50

## 2014-07-14 MED ORDER — GLYCERIN (LAXATIVE) 2.1 G RE SUPP
1.0000 | Freq: Once | RECTAL | Status: DC
  Filled 2014-07-14: qty 1

## 2014-07-14 MED ORDER — PHENYLEPHRINE HCL 10 MG/ML IJ SOLN
10.0000 mg | INTRAMUSCULAR | Status: DC | PRN
  Administered 2014-07-14: 20 ug/min via INTRAVENOUS

## 2014-07-14 MED ORDER — FENTANYL CITRATE (PF) 100 MCG/2ML IJ SOLN
25.0000 ug | INTRAMUSCULAR | Status: DC | PRN
  Administered 2014-07-14 (×2): 25 ug via INTRAVENOUS
  Administered 2014-07-14: 50 ug via INTRAVENOUS

## 2014-07-14 MED ORDER — VITAMIN K1 10 MG/ML IJ SOLN: 10.0000 mg | Freq: Once | INTRAVENOUS | Status: DC

## 2014-07-14 MED ORDER — CEFAZOLIN SODIUM-DEXTROSE 2-3 GM-% IV SOLR
INTRAVENOUS | Status: DC | PRN
  Administered 2014-07-14: 2 g via INTRAVENOUS

## 2014-07-14 MED ORDER — LIDOCAINE-EPINEPHRINE 1 %-1:100000 IJ SOLN
INTRAMUSCULAR | Status: AC
  Filled 2014-07-14: qty 1

## 2014-07-14 MED ORDER — SODIUM CHLORIDE 0.9 % IV SOLN
10.0000 mg | INTRAVENOUS | Status: DC | PRN
Start: 2014-07-14 — End: 2014-07-14
  Administered 2014-07-14: 10 ug/min via INTRAVENOUS

## 2014-07-14 MED ORDER — FENTANYL CITRATE (PF) 100 MCG/2ML IJ SOLN
INTRAMUSCULAR | Status: DC | PRN
  Administered 2014-07-14: 50 ug via INTRAVENOUS
  Administered 2014-07-14 (×2): 25 ug via INTRAVENOUS
  Administered 2014-07-14: 50 ug via INTRAVENOUS

## 2014-07-14 MED ORDER — NEOSTIGMINE METHYLSULFATE 10 MG/10ML IV SOLN
INTRAVENOUS | Status: AC
  Filled 2014-07-14: qty 1

## 2014-07-14 MED ORDER — GLYCOPYRROLATE 0.2 MG/ML IJ SOLN
INTRAMUSCULAR | Status: AC
  Filled 2014-07-14: qty 3

## 2014-07-14 MED ORDER — SODIUM CHLORIDE 0.9 % IV SOLN
INTRAVENOUS | Status: AC
  Administered 2014-07-14: 21:00:00 via INTRAVENOUS

## 2014-07-14 MED ORDER — OXYMETAZOLINE HCL 0.05 % NA SOLN
NASAL | Status: AC
  Filled 2014-07-14: qty 15

## 2014-07-14 MED ORDER — LIDOCAINE HCL (CARDIAC) 20 MG/ML IV SOLN
INTRAVENOUS | Status: DC | PRN
  Administered 2014-07-14: 100 mg via INTRAVENOUS

## 2014-07-14 MED ORDER — NEOSTIGMINE METHYLSULFATE 10 MG/10ML IV SOLN
INTRAVENOUS | Status: DC | PRN
  Administered 2014-07-14: 2.5 mg via INTRAVENOUS

## 2014-07-14 MED ORDER — LORAZEPAM 2 MG/ML IJ SOLN: 0.2500 mg | Freq: Three times a day (TID) | INTRAMUSCULAR | Status: DC | PRN

## 2014-07-14 MED ORDER — METOPROLOL TARTRATE 1 MG/ML IV SOLN
5.0000 mg | INTRAVENOUS | Status: DC | PRN
  Administered 2014-07-16: 5 mg via INTRAVENOUS
  Filled 2014-07-14: qty 5

## 2014-07-14 MED ORDER — LORAZEPAM 2 MG/ML IJ SOLN: 0.5000 mg | Freq: Four times a day (QID) | INTRAMUSCULAR | Status: DC | PRN

## 2014-07-14 MED ORDER — SODIUM CHLORIDE 0.9 % IV SOLN
INTRAVENOUS | Status: DC | PRN
  Administered 2014-07-14: 17:00:00 via INTRAVENOUS

## 2014-07-14 MED ORDER — SODIUM CHLORIDE 0.9 % IV SOLN: Freq: Once | INTRAVENOUS | Status: DC

## 2014-07-14 MED ORDER — LIDOCAINE HCL 1 % IJ SOLN
INTRAMUSCULAR | Status: AC
  Filled 2014-07-14: qty 20

## 2014-07-14 MED ORDER — PHENYLEPHRINE HCL 0.5 % NA SOLN
NASAL | Status: DC | PRN
  Administered 2014-07-14: 2 [drp] via NASAL

## 2014-07-14 MED ORDER — NICARDIPINE HCL IN NACL 20-0.86 MG/200ML-% IV SOLN
5.0000 mg/h | INTRAVENOUS | Status: DC
  Administered 2014-07-14: 5 mg/h via INTRAVENOUS
  Filled 2014-07-14 (×2): qty 200

## 2014-07-14 MED ORDER — ONDANSETRON HCL 4 MG/2ML IJ SOLN
INTRAMUSCULAR | Status: AC
  Filled 2014-07-14: qty 2

## 2014-07-14 MED ORDER — ACETAMINOPHEN 500 MG PO TABS
1000.0000 mg | ORAL_TABLET | Freq: Four times a day (QID) | ORAL | Status: DC | PRN
  Administered 2014-07-20 – 2014-07-22 (×3): 1000 mg via ORAL
  Filled 2014-07-14 (×3): qty 2

## 2014-07-14 MED ORDER — POLYVINYL ALCOHOL 1.4 % OP SOLN
1.0000 [drp] | Freq: Every day | OPHTHALMIC | Status: DC
  Administered 2014-07-17 – 2014-07-22 (×6): 1 [drp] via OPHTHALMIC
  Filled 2014-07-14: qty 15

## 2014-07-14 SURGICAL SUPPLY — 47 items
ATTRACTOMAT 16X20 MAGNETIC DRP (DRAPES) IMPLANT
BLADE RAD40 ROTATE 4M 4 5PK (BLADE) IMPLANT
BLADE RAD60 ROTATE M4 4 5PK (BLADE) IMPLANT
BLADE SURG 15 STRL LF DISP TIS (BLADE) IMPLANT
BLADE SURG 15 STRL SS (BLADE)
BLADE TRICUT ROTATE M4 4 5PK (BLADE) ×2 IMPLANT
CANISTER SUCTION 2500CC (MISCELLANEOUS) ×2 IMPLANT
COAGULATOR SUCT SWTCH 10FR 6 (ELECTROSURGICAL) IMPLANT
CORDS BIPOLAR (ELECTRODE) IMPLANT
CRADLE DONUT ADULT HEAD (MISCELLANEOUS) IMPLANT
DECANTER SPIKE VIAL GLASS SM (MISCELLANEOUS) ×2 IMPLANT
DRAPE PROXIMA HALF (DRAPES) IMPLANT
DRESSING NASAL POPE 10X1.5X2.5 (GAUZE/BANDAGES/DRESSINGS) ×2 IMPLANT
DRESSING TELFA 8X10 (GAUZE/BANDAGES/DRESSINGS) IMPLANT
DRSG NASAL POPE 10X1.5X2.5 (GAUZE/BANDAGES/DRESSINGS) ×4
DRSG NASOPORE 8CM (GAUZE/BANDAGES/DRESSINGS) ×8 IMPLANT
ELECT REM PT RETURN 9FT ADLT (ELECTROSURGICAL)
ELECTRODE REM PT RTRN 9FT ADLT (ELECTROSURGICAL) IMPLANT
FILTER ARTHROSCOPY CONVERTOR (FILTER) ×2 IMPLANT
FLOSEAL 10ML (HEMOSTASIS) IMPLANT
GLOVE SURG SS PI 7.5 STRL IVOR (GLOVE) ×2 IMPLANT
GOWN STRL REUS W/ TWL LRG LVL3 (GOWN DISPOSABLE) ×2 IMPLANT
GOWN STRL REUS W/TWL LRG LVL3 (GOWN DISPOSABLE) ×2
KIT BASIN OR (CUSTOM PROCEDURE TRAY) ×2 IMPLANT
KIT ROOM TURNOVER OR (KITS) ×2 IMPLANT
NEEDLE HYPO 25GX1X1/2 BEV (NEEDLE) ×2 IMPLANT
NEEDLE SPNL 25GX3.5 QUINCKE BL (NEEDLE) IMPLANT
NS IRRIG 1000ML POUR BTL (IV SOLUTION) ×2 IMPLANT
PAD ARMBOARD 7.5X6 YLW CONV (MISCELLANEOUS) ×4 IMPLANT
PATTIES SURGICAL .5 X3 (DISPOSABLE) ×2 IMPLANT
PENCIL FOOT CONTROL (ELECTRODE) ×2 IMPLANT
SHEATH ENDOSCRUB 0 DEG (SHEATH) IMPLANT
SHEATH ENDOSCRUB 30 DEG (SHEATH) IMPLANT
SHEATH ENDOSCRUB 45 DEG (SHEATH) IMPLANT
SOLUTION ANTI FOG 6CC (MISCELLANEOUS) ×2 IMPLANT
SPECIMEN JAR SMALL (MISCELLANEOUS) ×4 IMPLANT
STRIP CLOSURE SKIN 1/2X4 (GAUZE/BANDAGES/DRESSINGS) ×2 IMPLANT
SURGIFLO W/THROMBIN 8M KIT (HEMOSTASIS) ×4 IMPLANT
SUT ETHILON 3 0 PS 1 (SUTURE) IMPLANT
SWAB COLLECTION DEVICE MRSA (MISCELLANEOUS) IMPLANT
SYR 50ML SLIP (SYRINGE) IMPLANT
SYRINGE 10CC LL (SYRINGE) ×2 IMPLANT
TOWEL OR 17X24 6PK STRL BLUE (TOWEL DISPOSABLE) ×2 IMPLANT
TRAY ENT MC OR (CUSTOM PROCEDURE TRAY) ×2 IMPLANT
TUBE CONNECTING 12X1/4 (SUCTIONS) ×2 IMPLANT
WATER STERILE IRR 1000ML POUR (IV SOLUTION) ×2 IMPLANT
WIPE INSTRUMENT VISIWIPE 73X73 (MISCELLANEOUS) ×2 IMPLANT

## 2014-07-14 NOTE — H&P (Signed)
[facility] History and Physical                                                                                    Kathryn Gibson, is a 81 y.o. female  MRN: [id number]   DOB - 06/21/1933  Admit Date - 07/14/2014  Outpatient Primary MD for the patient is Tivis Ringer, MD  Referring Physician:    Chief Complaint:   Chief Complaint  Patient presents with  . Epistaxis     HPI  Kathryn Gibson  is a 81 y.o. female, with a past medical history of atrial fibrillation on chronic Coumadin, hypothyroidism, and sick sinus syndrome status post pacemaker placement. She presented to [facility] emergency department this morning with recurrent epistaxis. Mrs. Koper is a very active lady who lives at [facility]. She was going to the movies with her girlfriends on 5/30 when she tripped over a newly constructed concrete barrier in the front of a parking space. Unfortunately she landed on her face. She went to the urgent care who x-rayed her face and found no fracture.  On 6/30 the patient developed epistaxis and was seen in [place]. She left there at 2 AM on 6/4 with nasal packing in place. At approximately 2 AM on 6/5 her nose started bleeding again and soaked through the packing. She presented to [facility] ER. She was evaluated by Dr. core of the ENT who took her to the OR and embolized her left nare.  Unfortunately shortly after the procedure she started to bleed profusely once again. She was taken to interventional radiology for potential maxillary artery embolization.  In the emergency department the patient's initial CBC shows a stable hemoglobin. Her INR is 1.73.  She will be admitted to stepdown with severe epistaxis.   Review of Systems   In addition to the HPI above,  No Fever-chills, No Headache, No changes with Vision or hearing, No problems swallowing food or Liquids, No Chest pain, Cough or Shortness of Breath, No Abdominal pain, No Nausea or Vomiting,  Bowel movements are regular, No Blood in stool or Urine, No dysuria, No new skin rashes or bruises, No new joints pains-aches,  No new weakness, tingling, numbness in any extremity, No recent weight gain or loss, A full 10 point Review of Systems was done, except as stated above, all other Review of Systems were negative.  Past Medical History  Past Medical History  Diagnosis Date  . Atrial fibrillation   . Hypertension   . Hypothyroidism   . Irritable bowel syndrome (IBS)   . Constipation   . Colonic polyp     adenomatous  . Diverticulosis   . Bradycardia     s/p PPM  . Hemorrhoids   . Arthritis   . Cataract     Past Surgical History  Procedure Laterality Date  . Pacemaker insertion  2010    implanted by Dr Doreatha Lew ([facility])  . Colonoscopy        Social History History  Substance Use Topics  . Smoking status: Former Smoker    Types: Cigarettes    Quit date: 02/09/1980  . Smokeless tobacco: Never Used  .  Alcohol Use: 8.4 oz/week    14 Glasses of wine per week   she has a 35-pack-year tobacco history but no longer smokes. She drinks one to 2 glasses of wine per night.  Family History Family History  Problem Relation Age of Onset  . Heart failure Mother   . Heart attack Father   . Colon cancer Neg Hx   . Diabetes Paternal Grandfather   . Kidney disease Neg Hx   . Liver disease Neg Hx   . Esophageal cancer Neg Hx   . Heart disease Brother   . Lung cancer Son    her father died at age 55 with an MI. Her mother passed at age 24 with congestive heart failure.  Prior to Admission medications   Medication Sig Start Date End Date Taking? Authorizing Provider  acetaminophen (TYLENOL) 500 MG tablet Take 500-1,000 mg by mouth every 6 (six) hours as needed for moderate pain or headache.   Yes Historical Provider, MD  amiodarone (PACERONE) 100 MG tablet Take 50 mg by mouth daily.   Yes Historical Provider, MD  Ascorbic Acid (VITAMIN C) 1000 MG tablet Take 1,000 mg by  mouth daily.     Yes Historical Provider, MD  aspirin 81 MG tablet Take 81 mg by mouth daily.     Yes Historical Provider, MD  Calcium-Vitamin D (CALTRATE 600 PLUS-VIT D PO) Take 1 tablet by mouth daily.   Yes Historical Provider, MD  cholecalciferol (VITAMIN D) 1000 UNITS tablet Take 1,000 Units by mouth daily.     Yes Historical Provider, MD  levothyroxine (SYNTHROID, LEVOTHROID) 50 MCG tablet Take 15mcg by mouth every other day and take 75 mcg by mouth on alternate days   Yes Historical Provider, MD  LYSINE PO Take 1-2 tablets by mouth daily as needed (fever blister).   Yes Historical Provider, MD  metoprolol succinate (TOPROL-XL) 50 MG 24 hr tablet Take 1 tablet (50 mg total) by mouth daily. 04/16/14  Yes Darlin Coco, MD  Multiple Vitamin (MULTIVITAMIN) capsule Take 1 capsule by mouth daily.     Yes Historical Provider, MD  Omega-3 Fatty Acids (FISH OIL) 1200 MG CAPS Take 1 capsule by mouth daily. Mega Red.   Yes Historical Provider, MD  Polyethyl Glycol-Propyl Glycol (SYSTANE OP) Apply 1-2 drops to eye daily.   Yes Historical Provider, MD  polyethylene glycol powder (MIRALAX) powder Take two capfuls by mouth at bedtime   Yes Historical Provider, MD  Probiotic Product (ALIGN PO) Take 1 capsule by mouth daily.    Yes Historical Provider, MD  temazepam (RESTORIL) 30 MG capsule Take 30 mg by mouth at bedtime.    Yes Historical Provider, MD  verapamil (CALAN-SR) 240 MG CR tablet TAKE 1 BY MOUTH AT BEDTIME 05/27/14  Yes Darlin Coco, MD  warfarin (COUMADIN) 5 MG tablet Take as directed by coumadin clinic Patient taking differently: Take 2.5-5 mg by mouth daily. Take 5 mg Sunday, Monday, Wednesday, Friday and take 2.5 mg all other days 01/28/14   Darlin Coco, MD    No Known Allergies  Physical Exam  Vitals  Blood pressure 157/60, pulse 62, temperature 97.8 F (36.6 C), temperature source Axillary, resp. rate 25, height 5' 1.5" (1.562 m), weight 54.432 kg (120 lb), SpO2 96  %.   General:  Thin, well-preserved female lying in bed in NAD, daughter at bedside. Nasal packing in left nare  Psych:  Normal affect and insight, Not Suicidal or Homicidal, Awake Alert, Oriented X 3.  Neuro:  No F.N deficits, ALL C.Nerves Intact, Strength 5/5 all 4 extremities, Sensation intact all 4 extremities.  ENT:  Ears and Eyes appear Normal, Conjunctivae clear, PER. Dry mucous membranes, red petechiae in posterior oropharynx  Neck:  Supple, No lymphadenopathy appreciated  Respiratory:  Symmetrical chest wall movement, Good air movement bilaterally, CTAB.  Cardiac:  RRR, No Murmurs, no LE edema noted, no JVD.    Abdomen:  Positive bowel sounds, Soft, Non tender, Non distended,  No masses appreciated  Skin:  No Cyanosis, Normal Skin Turgor, No Skin Rash or Bruise.  Extremities:  Able to move all 4. 5/5 strength in each,  no effusions.  Data Review  CBC  Recent Labs Lab 07/12/14 2111 07/14/14 0540 07/14/14 1613  WBC 6.1 7.3 9.1  HGB 12.1 11.3* 12.0  HCT 37.0 33.7* 35.5*  PLT 236 220 246  MCV 89.2 87.5 88.3  MCH 29.2 29.4 29.9  MCHC 32.7 33.5 33.8  RDW 14.7 14.7 14.8    Chemistries   Recent Labs Lab 07/14/14 0859  NA 139  K 3.8  CL 102  CO2 28  GLUCOSE 100*  BUN 15  CREATININE 0.74  CALCIUM 9.2     Coagulation profile  Recent Labs Lab 07/12/14 2111 07/14/14 0540  INR 2.09* 1.73*     Imaging results:   My personal review of EKG: Atrial paced rhythm with a prolonged QT.   Assessment & Plan  Principal Problem:   Epistaxis Active Problems:   BRADYCARDIA-TACHYCARDIA SYNDROME   CONSTIPATION, CHRONIC   Hypothyroidism   Essential hypertension, benign   Sick sinus syndrome   Diastolic dysfunction   Epistaxis, recurrent   Severe epistaxis  Severe epistaxis Thought to be secondary to trauma earlier in the week.  Taken to the operating room by Dr. Simeon Craft, ENT who embolized the left nare.  Unfortunately the patient started rebleeding  shortly after the procedure. Interventional radiology is currently evaluating the patient for internal maxillary artery embolization.  She last took Coumadin and aspirin on Thursday evening 6/2.   2 units of FFP have been ordered. The patient has received vitamin K 1 mg She will be admitted to stepdown.  check CBC every 8. Would consider transfusion before she reaches a hgb of 7 given her history of atrial fibrillation, and pacemaker placement.  Atrial fibrillation Rate controlled. On chronic Coumadin. INR 1.7 in the emergency department today.  Hold Coumadin and aspirin. Reversing INR as patient continues to have profuse bleeding after embolizing the left nare. Normally on amio.  Holding all oral medications as the patient is being kept NPO. Will place orders for PRN metoprolol  Sick sinus syndrome Status post pacemaker placement.  Diastolic dysfunction grade 2 Echocardiogram done in 2013 shows an LVEF of 60% with grade 2 diastolic dysfunction. She is currently slightly dry.  No signs of volume overload. As she is NPO I placed her on gentle IVF for 24 hours.  Chronic constipation. The patient normally manages with daily miralax. Glycerin suppository is ordered while she is NPO.  Hypothyroidism Synthroid IV.  Consultants Called:  Dr. Simeon Craft of ENT, Interventional Radiology  Family Communication:   Daughter at bedside.  Code Status:  Full code  Condition:  guarded  Potential Disposition: TBD.  Hopefully will return to [place] at [place] in 48 hours.  Time spent in minutes : [address]  PA-C on 07/14/2014 at 6:27 PM Between 7am to 7pm - Pager - [phone] After 7pm go to [email or web address] - password  [email or web address] And look for the night coverage person covering me after hours  [facility]

## 2014-07-14 NOTE — Sedation Documentation (Signed)
Manual pressure still being held by Dr Estanislado Pandy

## 2014-07-14 NOTE — ED Provider Notes (Signed)
Patient seen/examined in the Emergency Department in conjunction with Midlevel Provider  Patient reports epistaxis Exam : awake/alert, blood noted to left nare.  Plan: concern for possible posterior epistaxis PA will place rhino rocket and reassess bleeding Pt currently stable and protecting her airway   Ripley Fraise, MD 07/14/14 [phone]

## 2014-07-14 NOTE — ED Notes (Signed)
Pt fell at home on Monday onto the sidewalk (cement). Pt reports she went to the MD Monday after her fall to be evaluated. Pt reports uncontrolled nose bleed that started yesterday and pt reports she was seen at [facility]. Pt reports nose bleed started again at 0200, EMS called and EMS adm afrin, EMS reports pt's nose bleed stopped. However restarted about 30 minutes ago, EMS reports clots and uncontrolled bleeding. Pt in department to be evaluated.

## 2014-07-14 NOTE — ED Notes (Signed)
Pt remains  Monitored by blood pressure and pulse ox. Pts family remains at bedside.

## 2014-07-14 NOTE — Anesthesia Procedure Notes (Signed)
Procedure Name: Intubation Date/Time: 07/14/2014 5:11 PM Performed by: Marinda Elk A Pre-anesthesia Checklist: Patient identified, Emergency Drugs available, Suction available and Patient being monitored Patient Re-evaluated:Patient Re-evaluated prior to inductionOxygen Delivery Method: Circle system utilized Preoxygenation: Pre-oxygenation with 100% oxygen Intubation Type: IV induction, Rapid sequence and Cricoid Pressure applied Laryngoscope Size: Mac and 3 Grade View: Grade I Tube type: Subglottic suction tube Tube size: 7.5 mm Number of attempts: 1 Airway Equipment and Method: Stylet Placement Confirmation: ETT inserted through vocal cords under direct vision and positive ETCO2 Secured at: 21 cm Tube secured with: Tape Dental Injury: Teeth and Oropharynx as per pre-operative assessment

## 2014-07-14 NOTE — Progress Notes (Signed)
.   Subjective: S/p left sinus surgery and nasal endoscopy with cautery/sphenopalatine artery ligation. Awake and alert in PACU.  Objective: Vital signs in last 24 hours: Temp:  [97.9 F (36.6 C)-98 F (36.7 C)] 98 F (36.7 C) (06/05 1430) Pulse Rate:  [55-65] 59 (06/05 1430) Resp:  [14-22] 14 (06/05 1430) BP: (58-143)/(40-85) 141/55 mmHg (06/05 1426) SpO2:  [97 %-100 %] 98 % (06/05 1430) Weight:  [54.432 kg (120 lb)] 54.432 kg (120 lb) (06/05 0513)  Cn 2-12 intact and symmetric, has some left eyelid ecchymosis that was present pre-op, EOMI, PERRLA, awake, alert, left nostril packed with absorbable nasopore with some mild sanguinous staining but no active epistaxis.  @LABLAST2 (wbc:2,hgb:2,hct:2,plt:2)  Recent Labs  07/14/14 0859  NA 139  K 3.8  CL 102  CO2 28  GLUCOSE 100*  BUN 15  CREATININE 0.74  CALCIUM 9.2    Medications: Scheduled Meds:  Continuous Infusions:  PRN Meds:.fentaNYL (SUBLIMAZE) injection, promethazine  Assessment/Plan: Hospitalist service to admit for observation s/p left nasal endoscopy with cautery given medical issues/Coumadin. Can go home from ENT standpoint with PRN followup if no further bleeding, packing is all absorbable.   LOS: 0 days   Ruby Cola 07/14/2014, 2:34 PM

## 2014-07-14 NOTE — Transfer of Care (Signed)
Immediate Anesthesia Transfer of Care Note  Patient: Kathryn Gibson  Procedure(s) Performed: Procedure(s): RADIOLOGY WITH ANESTHESIA (N/A)  Patient Location: PACU  Anesthesia Type:General  Level of Consciousness: awake, alert , oriented and patient cooperative  Airway & Oxygen Therapy: Patient Spontanous Breathing and Patient connected to face mask oxygen  Post-op Assessment: Report given to RN and Post -op Vital signs reviewed and stable  Post vital signs: Reviewed and stable  Last Vitals:  Filed Vitals:   07/14/14 1645  BP: 157/60  Pulse: 62  Temp:   Resp: 25    Complications: No apparent anesthesia complications

## 2014-07-14 NOTE — Op Note (Signed)
DATE OF OPERATION: 07/14/2014 Surgeon: Ruby Cola Procedure Performed: left endoscopic ligation of sphenopalatine artery and control of left epistaxis 31238-LT 31256-LT left maxillary antrostomy 31255-LT left total ethmoidectomy 31287-LT left sphenoidotomy  PREOPERATIVE DIAGNOSIS: recurrent severe left epistaxis on Coumadin POSTOPERATIVE DIAGNOSIS: recurrent severe left epistaxis on Coumadin  SURGEON: Ruby Cola ANESTHESIA: General endotracheal.  ESTIMATED BLOOD LOSS: less than 39mL.  DRAINS/DRESSINGS: left nasal/sinus surgiflo and nasopore absorbable packing SPECIMENS: left sinus contents INDICATIONS: The patient is a 51 with a history of recurrent severe left epistaxis on Coumadin DESCRIPTION OF OPERATION: The patient was brought to the operating room and was placed in the supine position and was placed under general endotracheal anesthesia by anesthesiology. The patient's nose was inspected the nose was decongested with Afrin-coated pledgets which were then removed. The patient was prepped and draped in the usual sterile fashion. After the Afrin pledgets were removed, the inferior turbinates were outfractured bilaterally.  I first began on the left. Lidocaine 1% with 1:100,000 epinephrine was injected into the region of the nasal septum, sphenopalatine arteries, greater palatine arteries, and uncinate process bilaterally. Brief endoscopy on the right demonstrated no clot or active bleeding, and no prominent vessels, so no cautery was performed on the right. On the left there was copious clotted blood suctioned out from the left sphenoethmoid recess and the left middle meatus, with no distinct bleeding source identified on the left septum or turbinates, just diffuse mucosal oozing and middle meatal and sphenoethmoid recess clot.  In order to identify and isolate the left sphenopalatine main trunk, lateral nasal branch of the sphenopalatine artery, and the posterior septal branch of the left  sphenopalatine artery, the left maxillary, ethmoid, and sphenoid sinuses were opened to allow full visualization and isolation of the left sphenopalatine artery and its branches.  Attention then was directed toward the left side sinuses. The left uncinate process was removed systematically inferiorly to superiorly with back-biting forceps and the 45 blakesley.. Next, the maxillary medial wall was identified and expanded with the back-biting forceps and 45 blakesley until the back wall of the maxillary sinus was clearly visible. I  widely opened the left maxillary medial wall all the way up to the maxillary roof and back to the back wall of the maxillary sinus, and the natural ostium was widely opened using the 45 blakesley.  I used the 0 degree scope and Blakesley to identifying the left ethmoid bulla and the anterior and posterior ethmoid air cells were entered primarily and dissected with the 52mm Kerrison and 45 Blakesley out to the lamina and up to the ethmoid roof.  The natural ostium of the left sphenoid sinus was identified using the suction and opened using the suction and the Cottle elevator. The anterior wall of the left sphenoid was then opened widely using the 45 Blakesley up to the sphenoid roof and out to the lamina.  Now that the surrounding sinuses were patent and the crista ethmoidalis landmarks were clearly visible, I proceeded with the left sphenopalatine artery ligation. The left middle turbinate was deflected medially and Bolgerized to the septum with the Bovie, and the left crista ethmoidalis was identified. The Cottle was used to elevate the mucosa off of the crista ethmoidalis and the sphenopalatine foramen was identified with the sphenopalatine artery main trunk coursing out of it. The suction Bovie cautery was used to ligate and cauterize the left sphenopalatine artery. Next I identified the left lateral nasal branch coming anteriorly along the lateral nasal wall/inferior to the left  maxillary sinus and cauterized the lateral nasal branch with the suction Bovie. Lastly I used the suction and Cottle to identify the left posterior septal branch of the sphenopalatine artery coursing along the left sphenoidotomy anteriorly/inferiorly, and the left posterior septal branch was cauterized using the Bovie suction cautery.   I irrigated out the sinuses and suctioned out the nose and stomach. Hemostasis was noted so I Bolgerized the left middle turbinate to the septum and placed absorbable surgiflo foam and then absorbable 8cm Nasopore packing in the left ethmoid/middle meatus. The patient was turned back to anesthesia and extubated and awakened.   The patient tolerated the procedure well and returned to the recovery room in stable condition.   Dr. Ruby Cola was present and performed the entire procedure. 07/14/2014 2:08 PM Ruby Cola, MD  Plan: packing is all absorbable and will dissolve in 2 to 3 weeks. Patient can follow up as needed. No nose blowing x 2 weeks. Hospitalist service is admitting for observation and can go home from ENT standpoint if no further major bleeding.

## 2014-07-14 NOTE — ED Provider Notes (Signed)
CSN: [id number]     Arrival date & time 07/14/14  0505 History   First MD Initiated Contact with Patient 07/14/14 0602     Chief Complaint  Patient presents with  . Epistaxis     (Consider location/radiation/quality/duration/timing/severity/associated sxs/prior Treatment) HPI Comments: Patient's on anticoagulation with Coumadin due to A. fib presents with continued bleeding from her left naris. Patient was seen at [facility] Friday evening (6/3) and had a Rhino Rocket placed which did not work. She then had her there are packed with Surgicel which stopped the bleeding for several hours. Patient states that several hours after returning home the bleeding returned again. The packing must have fallen out when it the bleeding restarted. She's continued to have a slow ooze since that time. She had called EMS early this morning who applied a gauze packing. Patient has changed packing 3 since that time. The onset of this condition was acute. The course is constant. Aggravating factors: none. Alleviating factors: none.   Patient has held coumadin since bleeding began.   Several days prior to the bleeding, patient had a fall. She was seen at an outside urgent care and had x-rays which revealed a broken nose. She did not have bleeding until several days after this.   Patient is a 81 y.o. female presenting with nosebleeds. The history is provided by the patient and medical records.  Epistaxis Associated symptoms: no cough, no dizziness, no fever, no headaches and no sore throat     Past Medical History  Diagnosis Date  . Atrial fibrillation   . Hypertension   . Hypothyroidism   . Irritable bowel syndrome (IBS)   . Constipation   . Colonic polyp     adenomatous  . Diverticulosis   . Bradycardia     s/p PPM  . Hemorrhoids   . Arthritis   . Cataract    Past Surgical History  Procedure Laterality Date  . Pacemaker insertion  2010    implanted by Dr Doreatha Lew ([facility])  . Colonoscopy      Family History  Problem Relation Age of Onset  . Heart failure Mother   . Heart attack Father   . Colon cancer Neg Hx   . Diabetes Paternal Grandfather   . Kidney disease Neg Hx   . Liver disease Neg Hx   . Esophageal cancer Neg Hx   . Heart disease Brother   . Lung cancer Son    History  Substance Use Topics  . Smoking status: Former Smoker    Types: Cigarettes    Quit date: 02/09/1980  . Smokeless tobacco: Never Used  . Alcohol Use: 8.4 oz/week    14 Glasses of wine per week   OB History    No data available     Review of Systems  Constitutional: Negative for fever and fatigue.  HENT: Positive for nosebleeds. Negative for rhinorrhea, sore throat and tinnitus.   Eyes: Negative for photophobia, pain, redness and visual disturbance.  Respiratory: Negative for cough and shortness of breath.   Cardiovascular: Negative for chest pain.  Gastrointestinal: Negative for nausea, vomiting, abdominal pain and diarrhea.  Genitourinary: Negative for dysuria.  Musculoskeletal: Negative for myalgias, back pain, gait problem and neck pain.  Skin: Negative for rash and wound.  Neurological: Negative for dizziness, weakness, light-headedness, numbness and headaches.  Psychiatric/Behavioral: Negative for confusion and decreased concentration.    Allergies  Review of patient's allergies indicates no known allergies.  Home Medications   Prior to Admission medications  Medication Sig Start Date End Date Taking? Authorizing Provider  acetaminophen (TYLENOL) 500 MG tablet Take 500-1,000 mg by mouth every 6 (six) hours as needed for moderate pain or headache.    Historical Provider, MD  amiodarone (PACERONE) 100 MG tablet Take 50 mg by mouth daily.    Historical Provider, MD  amiodarone (PACERONE) 200 MG tablet TAKE 1/2 BY MOUTH DAILY Patient not taking: Reported on 07/12/2014 04/12/14   Darlin Coco, MD  amiodarone (PACERONE) 200 MG tablet TAKE 1/2 BY MOUTH DAILY Patient not taking:  Reported on 07/12/2014 06/20/14   Darlin Coco, MD  Ascorbic Acid (VITAMIN C) 1000 MG tablet Take 1,000 mg by mouth daily.      Historical Provider, MD  aspirin 81 MG tablet Take 81 mg by mouth daily.      Historical Provider, MD  Calcium-Vitamin D (CALTRATE 600 PLUS-VIT D PO) Take 1 tablet by mouth daily.    Historical Provider, MD  cholecalciferol (VITAMIN D) 1000 UNITS tablet Take 1,000 Units by mouth daily.      Historical Provider, MD  levothyroxine (SYNTHROID, LEVOTHROID) 50 MCG tablet Take 77mcg by mouth every other day and take 75 mcg by mouth on alternate days    Historical Provider, MD  LYSINE PO Take 1-2 tablets by mouth daily as needed (fever blister).    Historical Provider, MD  metoprolol succinate (TOPROL-XL) 25 MG 24 hr tablet Take 25 mg by mouth daily.    Historical Provider, MD  metoprolol succinate (TOPROL-XL) 50 MG 24 hr tablet Take 1 tablet (50 mg total) by mouth daily. Patient not taking: Reported on 07/12/2014 04/16/14   Darlin Coco, MD  Multiple Vitamin (MULTIVITAMIN) capsule Take 1 capsule by mouth daily.      Historical Provider, MD  Omega-3 Fatty Acids (FISH OIL) 1200 MG CAPS Take 1 capsule by mouth daily. Mega Red.    Historical Provider, MD  Polyethyl Glycol-Propyl Glycol (SYSTANE OP) Apply 1-2 drops to eye daily.    Historical Provider, MD  polyethylene glycol powder (MIRALAX) powder Take two capfuls by mouth at bedtime    Historical Provider, MD  Probiotic Product (ALIGN PO) Take 1 capsule by mouth daily.     Historical Provider, MD  temazepam (RESTORIL) 30 MG capsule Take 30 mg by mouth at bedtime.     Historical Provider, MD  verapamil (CALAN-SR) 240 MG CR tablet TAKE 1 BY MOUTH AT BEDTIME 05/27/14   Darlin Coco, MD  warfarin (COUMADIN) 5 MG tablet Take as directed by coumadin clinic Patient taking differently: Take 5 mg by mouth. Take 5 mg Sunday, Monday, Wednesday, Friday and take 2.5 mg all other days 01/28/14   Darlin Coco, MD   BP 108/60 mmHg   Pulse 60  Temp(Src) 97.9 F (36.6 C)  Resp 16  Ht 5' 1.5" (1.562 m)  Wt 120 lb (54.432 kg)  BMI 22.31 kg/m2  SpO2 100%   Physical Exam  Constitutional: She appears well-developed and well-nourished.  HENT:  Head: Normocephalic. Head is with contusion (minimal bruising below eyes).  Nose: Mucosal edema present. No sinus tenderness, nasal deformity, septal deviation or nasal septal hematoma. Epistaxis (L, posterior) is observed.  Dried blood in pharynx.   Eyes: Conjunctivae are normal.  Neck: Normal range of motion. Neck supple.  Pulmonary/Chest: No respiratory distress.  Neurological: She is alert.  Skin: Skin is warm and dry.  Psychiatric: She has a normal mood and affect.  Nursing note and vitals reviewed.   ED Course  Procedures (including critical  care time) Labs Review Labs Reviewed  CBC - Abnormal; Notable for the following:    RBC 3.85 (*)    Hemoglobin 11.3 (*)    HCT 33.7 (*)    All other components within normal limits  PROTIME-INR - Abnormal; Notable for the following:    Prothrombin Time 20.2 (*)    INR 1.73 (*)    All other components within normal limits  BASIC METABOLIC PANEL    Imaging Review No results found.   EKG Interpretation None       6:24 AM Patient seen and examined. We reviewed previous treatment which includes holding pressure appropriately, Afrin, gauze packing, rapid rhino, surgicel. I offer to attempt packing again. Patient adamantly refuses stating that it hurts and it didn't work the first time. I cannot see the exact point where bleeding is coming from but appears posterior and I do not see anything targets to cauterize.   Vital signs reviewed and are as follows: BP 108/60 mmHg  Pulse 60  Temp(Src) 97.9 F (36.6 C)  Resp 16  Ht 5' 1.5" (1.562 m)  Wt 120 lb (54.432 kg)  BMI 22.31 kg/m2  SpO2 100%  7:20 AM Patient seen by Dr. Christy Gentles who convinced patient to have packing.   Patient was asked to blow her nose, which she had  trouble doing 2/2 mucosal edema. One spray of Afrin was administered. 7.5cm rapid rhino was wetted with saline and inserted. Both balloons were inflated with 3cc of air. Patient tolerated procedure well. Will reassess. INR noted to be 1.73.   7:30 AM Packing saturated with blood. Additional 1cc air (2cc total) into each balloon. Will recheck.   8:25 AM Several rechecks performed. Patient continues to have some dripping from packing. I am not confident that patient will do well if discharged to home.   Spoke with Dr. Simeon Craft of ENT. Advised admit to medicine. He will see inpatient.   Patient and family updated and agree with plan. Waiting for BMP to result so I can admit patient.   11:04 AM Pt admitted to [facility].    MDM   Final diagnoses:  Posterior epistaxis   Admit.    Carlisle Cater, PA-C 07/14/14 [place], MD 07/14/14 [phone]

## 2014-07-14 NOTE — Anesthesia Postprocedure Evaluation (Signed)
  Anesthesia Post-op Note  Patient: Kathryn Gibson  Procedure(s) Performed: Procedure(s) (LRB): RADIOLOGY WITH ANESTHESIA (N/A)  Patient Location: PACU  Anesthesia Type: General  Level of Consciousness: awake and alert   Airway and Oxygen Therapy: Patient Spontanous Breathing  Post-op Pain: mild  Post-op Assessment: Post-op Vital signs reviewed, Patient's Cardiovascular Status Stable, Respiratory Function Stable, Patent Airway and No signs of Nausea or vomiting  Last Vitals:  Filed Vitals:   07/14/14 2030  BP:   Pulse: 71  Temp:   Resp: 16    Post-op Vital Signs: stable   Complications: No apparent anesthesia complications

## 2014-07-14 NOTE — ED Notes (Signed)
Pt is on coumadin

## 2014-07-14 NOTE — Progress Notes (Signed)
Late entry Call MD notify hematoma, press area and keep BP 110 order received.also notified pt/INR, Hgb result. Started cardine gtts 5mg /hr @2217 , bp dropped 74, stopped cardine gtts, call anesthegiolgy for BP, press Rt. Groin, called rapid respond, 500 ml NS bolus given,  Dr. Kalman Shan aware of Pt/INR, cbc result  FFP i unit order received. Pt. tx to 35m04 by Rapid respond RN@ 1820 via bed

## 2014-07-14 NOTE — Sedation Documentation (Signed)
Right groin noted to have soft swelling inferior lateral to puncture site, pressure held immediately by Melchor Amour.  Dr Estanislado Pandy aware.

## 2014-07-14 NOTE — Procedures (Signed)
S/P bilateral common carotid and bilateral external carotid arteriograms followed superselective embolization of Lt IMAX and infraorbital branches ,and Rt IMAX ,Infraorbital and nasal branches of facial artery using PVA particles

## 2014-07-14 NOTE — Progress Notes (Addendum)
Called by PACU nurse with report of patient bleeding from left nasal cavity. When seen bright red blood oozing from left nasal cavity through and around the left nasopore packing. I removed the old nasopore and placed a new 8cm nasopore anteriorly and a new 8cm nasopore posteriorly on the left, both coated with bactitracin ointment. Hemostasis was noted. Will speak with Hospitalists about reversing Coumadin. May need interventional radiology embolization of left internal maxillary artery if continues to bleed.

## 2014-07-14 NOTE — Anesthesia Preprocedure Evaluation (Signed)
Anesthesia Evaluation  Patient identified by MRN, date of birth, ID band Patient awake    Reviewed: Allergy & Precautions, NPO status , Patient's Chart, lab work & pertinent test results  Airway Mallampati: II  TM Distance: >3 FB Neck ROM: Full    Dental no notable dental hx.    Pulmonary neg pulmonary ROS, former smoker,  breath sounds clear to auscultation  Pulmonary exam normal       Cardiovascular hypertension, Pt. on medications and Pt. on home beta blockers Normal cardiovascular exam+ dysrhythmias Atrial Fibrillation + pacemaker + Valvular Problems/Murmurs AI Rhythm:Regular Rate:Normal     Neuro/Psych negative neurological ROS  negative psych ROS   GI/Hepatic negative GI ROS, Neg liver ROS,   Endo/Other  negative endocrine ROS  Renal/GU negative Renal ROS  negative genitourinary   Musculoskeletal  (+) Arthritis -,   Abdominal   Peds negative pediatric ROS (+)  Hematology negative hematology ROS (+)   Anesthesia Other Findings   Reproductive/Obstetrics negative OB ROS                             Anesthesia Physical Anesthesia Plan  ASA: III and emergent  Anesthesia Plan: General   Post-op Pain Management:    Induction: Intravenous  Airway Management Planned: Oral ETT  Additional Equipment:   Intra-op Plan:   Post-operative Plan: Extubation in OR  Informed Consent: I have reviewed the patients History and Physical, chart, labs and discussed the procedure including the risks, benefits and alternatives for the proposed anesthesia with the patient or authorized representative who has indicated his/her understanding and acceptance.   Dental advisory given  Plan Discussed with: CRNA and Surgeon  Anesthesia Plan Comments:         Anesthesia Quick Evaluation

## 2014-07-14 NOTE — ED Notes (Signed)
PA Josh at bedside.

## 2014-07-14 NOTE — Progress Notes (Signed)
Nasal bleeding noted, BRB, from left nostril.states feels something going down throat as well. Dr. Simeon Craft contacted and made aware.

## 2014-07-14 NOTE — ED Notes (Signed)
Returned from the bathroom. Lab at the bedside. ENT at the bedside.

## 2014-07-14 NOTE — Transfer of Care (Signed)
Immediate Anesthesia Transfer of Care Note  Patient: Kathryn Gibson  Procedure(s) Performed: Procedure(s): NASAL ENDOSCOPY WITH EPISTAXIS CONTROL (N/A)  Patient Location: PACU  Anesthesia Type:General  Level of Consciousness: awake, oriented, patient cooperative and responds to stimulation  Airway & Oxygen Therapy: Patient Spontanous Breathing and Patient connected to face mask oxygen  Post-op Assessment: Patient moving all extremities and Patient moving all extremities X 4  Post vital signs: Reviewed and stable  Last Vitals:  Filed Vitals:   07/14/14 1200  BP: 117/51  Pulse: 58  Temp:   Resp: 16    Complications: No apparent anesthesia complications

## 2014-07-14 NOTE — Anesthesia Procedure Notes (Signed)
Procedure Name: Intubation Date/Time: 07/14/2014 1:10 PM Performed by: Jacquiline Doe A Pre-anesthesia Checklist: Patient identified, Timeout performed, Emergency Drugs available, Suction available and Patient being monitored Patient Re-evaluated:Patient Re-evaluated prior to inductionOxygen Delivery Method: Circle system utilized Preoxygenation: Pre-oxygenation with 100% oxygen Intubation Type: IV induction, Rapid sequence and Cricoid Pressure applied Ventilation: Mask ventilation without difficulty Laryngoscope Size: Mac and 4 Grade View: Grade I Tube type: Oral Tube size: 7.0 mm Number of attempts: 1 Airway Equipment and Method: Stylet Placement Confirmation: ETT inserted through vocal cords under direct vision,  breath sounds checked- equal and bilateral and positive ETCO2 Secured at: 21 cm Tube secured with: Tape Dental Injury: Teeth and Oropharynx as per pre-operative assessment

## 2014-07-14 NOTE — Sedation Documentation (Signed)
Transferred to PACU.  Right groin soft, dressing clean, dry and intact.  Pressure dressing in place.  10 lb sand bag applied.  Groin reviewed with PACU RN by Sedalia Muta RT.  Pedal pulses strong.

## 2014-07-14 NOTE — Sedation Documentation (Signed)
6 french Eoseal by Melchor Amour RT

## 2014-07-14 NOTE — ED Notes (Signed)
amb pt to the bathroom.

## 2014-07-14 NOTE — Progress Notes (Signed)
When seen again in PACU patient still bleeding through anterior and posterior 8cm nasopores. I removed both the anterior and posterior nasopore and placed two side-by-side 10 cm meroceles and secured these to patient's left cheek. Patient was still bleeding through the side by side meroceles so she received vitamin K and I ordered 1 unit of fresh frozen plasma and spoke to Dr. Allean Found from Hospitalist service and Interventional radiology on call who will evaluate patient for possible internal maxillary artery embolization.

## 2014-07-14 NOTE — H&P (Signed)
07/14/2014  8:53 AM  Kathryn Gibson  PREOPERATIVE HISTORY AND PHYSICAL/CONSULT NOTE  CHIEF COMPLAINT: left epistaxis related to coumadin  HISTORY: This is a 81 year old on Coumadin for A-fib who presents with left epistaxis. Was apparently seen at [facility], had packing that did not work, EMT tried gauze, patient has had persistent epistaxis and ER consulted ENT for control.  She now presents for endoscopy with control of epistaxis.  Dr. Simeon Craft, Alroy Dust has discussed the risks (bleeding, infection, anesthesia risks, septal perforation, etc.), benefits, and alternatives of this procedure. The patient understands the risks and would like to proceed with the procedure. The chances of success of the procedure are >50% and the patient understands this. I personally performed an examination of the patient within 24 hours of the procedure.  PAST MEDICAL HISTORY: Past Medical History  Diagnosis Date  . Atrial fibrillation   . Hypertension   . Hypothyroidism   . Irritable bowel syndrome (IBS)   . Constipation   . Colonic polyp     adenomatous  . Diverticulosis   . Bradycardia     s/p PPM  . Hemorrhoids   . Arthritis   . Cataract     PAST SURGICAL HISTORY: Past Surgical History  Procedure Laterality Date  . Pacemaker insertion  2010    implanted by Dr Doreatha Lew ([facility])  . Colonoscopy      MEDICATIONS: No current facility-administered medications on file prior to encounter.   Current Outpatient Prescriptions on File Prior to Encounter  Medication Sig Dispense Refill  . acetaminophen (TYLENOL) 500 MG tablet Take 500-1,000 mg by mouth every 6 (six) hours as needed for moderate pain or headache.    Marland Kitchen amiodarone (PACERONE) 100 MG tablet Take 50 mg by mouth daily.    . Ascorbic Acid (VITAMIN C) 1000 MG tablet Take 1,000 mg by mouth daily.      Marland Kitchen aspirin 81 MG tablet Take 81 mg by mouth daily.      . Calcium-Vitamin D (CALTRATE 600 PLUS-VIT D PO) Take 1 tablet by mouth daily.    .  cholecalciferol (VITAMIN D) 1000 UNITS tablet Take 1,000 Units by mouth daily.      Marland Kitchen levothyroxine (SYNTHROID, LEVOTHROID) 50 MCG tablet Take 19mcg by mouth every other day and take 75 mcg by mouth on alternate days    . LYSINE PO Take 1-2 tablets by mouth daily as needed (fever blister).    . metoprolol succinate (TOPROL-XL) 50 MG 24 hr tablet Take 1 tablet (50 mg total) by mouth daily. 90 tablet 6  . Multiple Vitamin (MULTIVITAMIN) capsule Take 1 capsule by mouth daily.      . Omega-3 Fatty Acids (FISH OIL) 1200 MG CAPS Take 1 capsule by mouth daily. Mega Red.    Vladimir Faster Glycol-Propyl Glycol (SYSTANE OP) Apply 1-2 drops to eye daily.    . polyethylene glycol powder (MIRALAX) powder Take two capfuls by mouth at bedtime    . Probiotic Product (ALIGN PO) Take 1 capsule by mouth daily.     . temazepam (RESTORIL) 30 MG capsule Take 30 mg by mouth at bedtime.     . verapamil (CALAN-SR) 240 MG CR tablet TAKE 1 BY MOUTH AT BEDTIME 90 tablet 1  . amiodarone (PACERONE) 200 MG tablet TAKE 1/2 BY MOUTH DAILY (Patient not taking: Reported on 07/12/2014) 45 tablet 0  . amiodarone (PACERONE) 200 MG tablet TAKE 1/2 BY MOUTH DAILY (Patient not taking: Reported on 07/12/2014) 45 tablet 0  . warfarin (COUMADIN) 5  MG tablet Take as directed by coumadin clinic (Patient taking differently: Take 2.5-5 mg by mouth daily. Take 5 mg Sunday, Monday, Wednesday, Friday and take 2.5 mg all other days) 90 tablet 1     ALLERGIES: No Known Allergies  SOCIAL HISTORY: History   Social History  . Marital Status: Widowed    Spouse Name: N/A  . Number of Children: 3  . Years of Education: N/A   Occupational History  . retired    Social History Main Topics  . Smoking status: Former Smoker    Types: Cigarettes    Quit date: 02/09/1980  . Smokeless tobacco: Never Used  . Alcohol Use: 8.4 oz/week    14 Glasses of wine per week  . Drug Use: No  . Sexual Activity: Not on file   Other Topics Concern  . Not on file    Social History Narrative    FAMILY HISTORY: Family History  Problem Relation Age of Onset  . Heart failure Mother   . Heart attack Father   . Colon cancer Neg Hx   . Diabetes Paternal Grandfather   . Kidney disease Neg Hx   . Liver disease Neg Hx   . Esophageal cancer Neg Hx   . Heart disease Brother   . Lung cancer Son     REVIEW OF SYSTEMS:  HEENT: epistaxis, roof of mouth pain, otherwise negative x 12 systems except per HPI    PHYSICAL EXAM:  GENERAL:  NAD VITAL SIGNS:   Filed Vitals:   07/14/14 0815  BP: 118/60  Pulse: 59  Temp:   Resp:    SKIN:  Warm, dry HEENT:  Oral cavity normal with no masses or lesions, soft and hard palate normal. Right nasal cavity hemostatic, left nasal cavity with bloody rhino-rocket half-in nasal cavity. NECK: supple   ABDOMEN:  soft MUSCULOSKELETAL: normal strength PSYCH:  Normal affect NEUROLOGIC:  CN 2-12 intact and symmetric   ASSESSMENT AND PLAN: Plan to proceed with nasal endoscopy with control of epistaxis. Patient understands the risks, benefits, and alternatives. Informed written consent obtained/signed/witnessed and given to operating room staff. Hospitalists to admit for post-op care given multiple medical problems. 07/14/2014  8:53 AM Kathryn Gibson

## 2014-07-14 NOTE — ED Notes (Signed)
Lab called to ask about the BMP, has not received a light green top. Reordered so it is not an add-on

## 2014-07-14 NOTE — ED Notes (Signed)
Jarrett Soho, RN on 2W called back and informed pt will go to OR before coming to floor

## 2014-07-14 NOTE — Anesthesia Preprocedure Evaluation (Signed)
Anesthesia Evaluation  Patient identified by MRN, date of birth, ID band Patient awake    Reviewed: Allergy & Precautions, NPO status , Patient's Chart, lab work & pertinent test results  Airway Mallampati: II  TM Distance: >3 FB Neck ROM: Full    Dental no notable dental hx.    Pulmonary neg pulmonary ROS, former smoker,  breath sounds clear to auscultation  Pulmonary exam normal       Cardiovascular hypertension, Pt. on medications Normal cardiovascular exam+ dysrhythmias Atrial Fibrillation + pacemaker + Valvular Problems/Murmurs AI Rhythm:Regular Rate:Normal     Neuro/Psych negative neurological ROS  negative psych ROS   GI/Hepatic negative GI ROS, Neg liver ROS,   Endo/Other  Hypothyroidism   Renal/GU negative Renal ROS  negative genitourinary   Musculoskeletal negative musculoskeletal ROS (+)   Abdominal   Peds negative pediatric ROS (+)  Hematology On coumadin, anticoagulated   Anesthesia Other Findings   Reproductive/Obstetrics negative OB ROS                             Anesthesia Physical Anesthesia Plan  ASA: III and emergent  Anesthesia Plan: General   Post-op Pain Management:    Induction: Intravenous, Rapid sequence and Cricoid pressure planned  Airway Management Planned: Oral ETT  Additional Equipment:   Intra-op Plan:   Post-operative Plan: Extubation in OR  Informed Consent: I have reviewed the patients History and Physical, chart, labs and discussed the procedure including the risks, benefits and alternatives for the proposed anesthesia with the patient or authorized representative who has indicated his/her understanding and acceptance.   Dental advisory given  Plan Discussed with: CRNA and Surgeon  Anesthesia Plan Comments:         Anesthesia Quick Evaluation

## 2014-07-14 NOTE — Anesthesia Postprocedure Evaluation (Signed)
  Anesthesia Post-op Note  Patient: Kathryn Gibson  Procedure(s) Performed: Procedure(s) (LRB): NASAL ENDOSCOPY WITH EPISTAXIS CONTROL (N/A)  Patient Location: PACU  Anesthesia Type: General  Level of Consciousness: awake and alert   Airway and Oxygen Therapy: Patient Spontanous Breathing  Post-op Pain: mild  Post-op Assessment: Post-op Vital signs reviewed, Patient's Cardiovascular Status Stable, Respiratory Function Stable, Patent Airway and No signs of Nausea or vomiting  Last Vitals:  Filed Vitals:   07/14/14 1630  BP:   Pulse: 60  Temp:   Resp: 21    Post-op Vital Signs: stable   Complications: No apparent anesthesia complications

## 2014-07-14 NOTE — Progress Notes (Addendum)
I instilled additional 74mL of surgiflo foam around and anterior to the left 10cm meroceles and replaced the drip pad. Patient still with left sided oozing, somewhat improved after the meroceles. Will monitor. The meroceles will need to stay in for 2 weeks before removal and patient will need to be on an antibiotic for strep/staph coverage until the packing is removed.

## 2014-07-15 ENCOUNTER — Encounter (HOSPITAL_COMMUNITY)
Admission: EM | Disposition: A | Discharge status: Skilled Nursing Facility | Payer: Self-pay | Source: Home / Self Care | Attending: Internal Medicine

## 2014-07-15 ENCOUNTER — Inpatient Hospital Stay (HOSPITAL_COMMUNITY): Payer: Medicare Other

## 2014-07-15 ENCOUNTER — Encounter (HOSPITAL_COMMUNITY): Payer: Self-pay

## 2014-07-15 ENCOUNTER — Other Ambulatory Visit: Payer: Self-pay

## 2014-07-15 ENCOUNTER — Inpatient Hospital Stay (HOSPITAL_COMMUNITY): Payer: Medicare Other | Admitting: Anesthesiology

## 2014-07-15 DIAGNOSIS — M7981 Nontraumatic hematoma of soft tissue: Secondary | ICD-10-CM

## 2014-07-15 DIAGNOSIS — S301XXA Contusion of abdominal wall, initial encounter: Secondary | ICD-10-CM

## 2014-07-15 DIAGNOSIS — IMO0002 Reserved for concepts with insufficient information to code with codable children: Secondary | ICD-10-CM | POA: Diagnosis present

## 2014-07-15 HISTORY — PX: NASAL ENDOSCOPY WITH EPISTAXIS CONTROL: SHX5664

## 2014-07-15 HISTORY — PX: HEMATOMA EVACUATION: SHX5118

## 2014-07-15 LAB — CBC WITH DIFFERENTIAL/PLATELET
Basophils Absolute: 0 10*3/uL (ref 0.0–0.1)
Basophils Relative: 0 % (ref 0–1)
Eosinophils Absolute: 0 10*3/uL (ref 0.0–0.7)
Eosinophils Relative: 0 % (ref 0–5)
HEMATOCRIT: 22.2 % — AB (ref 36.0–46.0)
Hemoglobin: 7.4 g/dL — ABNORMAL LOW (ref 12.0–15.0)
Lymphocytes Relative: 10 % — ABNORMAL LOW (ref 12–46)
Lymphs Abs: 0.9 10*3/uL (ref 0.7–4.0)
MCH: 29.8 pg (ref 26.0–34.0)
MCHC: 33.3 g/dL (ref 30.0–36.0)
MCV: 89.5 fL (ref 78.0–100.0)
MONOS PCT: 11 % (ref 3–12)
Monocytes Absolute: 0.9 10*3/uL (ref 0.1–1.0)
NEUTROS PCT: 79 % — AB (ref 43–77)
Neutro Abs: 7 10*3/uL (ref 1.7–7.7)
Platelets: 171 10*3/uL (ref 150–400)
RBC: 2.48 MIL/uL — ABNORMAL LOW (ref 3.87–5.11)
RDW: 14.9 % (ref 11.5–15.5)
WBC: 8.9 10*3/uL (ref 4.0–10.5)

## 2014-07-15 LAB — PREPARE FRESH FROZEN PLASMA
UNIT DIVISION: 0
UNIT DIVISION: 0
Unit division: 0
Unit division: 0
Unit division: 0

## 2014-07-15 LAB — CBC
HCT: 19.3 % — ABNORMAL LOW (ref 36.0–46.0)
HCT: 27 % — ABNORMAL LOW (ref 36.0–46.0)
HEMOGLOBIN: 9.3 g/dL — AB (ref 12.0–15.0)
Hemoglobin: 6.4 g/dL — CL (ref 12.0–15.0)
MCH: 29.6 pg (ref 26.0–34.0)
MCH: 28.8 pg (ref 26.0–34.0)
MCHC: 33.2 g/dL (ref 30.0–36.0)
MCHC: 34.4 g/dL (ref 30.0–36.0)
MCV: 89.4 fL (ref 78.0–100.0)
MCV: 83.6 fL (ref 78.0–100.0)
Platelets: 155 10*3/uL (ref 150–400)
Platelets: 137 10*3/uL — ABNORMAL LOW (ref 150–400)
RBC: 2.16 MIL/uL — ABNORMAL LOW (ref 3.87–5.11)
RBC: 3.23 MIL/uL — AB (ref 3.87–5.11)
RDW: 14.9 % (ref 11.5–15.5)
RDW: 17.4 % — ABNORMAL HIGH (ref 11.5–15.5)
WBC: 11.6 10*3/uL — AB (ref 4.0–10.5)
WBC: 12.7 10*3/uL — ABNORMAL HIGH (ref 4.0–10.5)

## 2014-07-15 LAB — APTT: aPTT: 25 seconds (ref 24–37)

## 2014-07-15 LAB — BASIC METABOLIC PANEL
Anion gap: 6 (ref 5–15)
BUN: 9 mg/dL (ref 6–20)
CO2: 26 mmol/L (ref 22–32)
Calcium: 7.6 mg/dL — ABNORMAL LOW (ref 8.9–10.3)
Chloride: 110 mmol/L (ref 101–111)
Creatinine, Ser: 0.7 mg/dL (ref 0.44–1.00)
GFR calc non Af Amer: >60 mL/min (ref >60)
GLUCOSE: 139 mg/dL — AB (ref 65–99)
Potassium: 3.9 mmol/L (ref 3.5–5.1)
Sodium: 142 mmol/L (ref 135–145)

## 2014-07-15 LAB — PREPARE RBC (CROSSMATCH)

## 2014-07-15 LAB — PROTIME-INR
INR: 1.3 (ref 0.00–1.49)
PROTHROMBIN TIME: 16.3 s — AB (ref 11.6–15.2)

## 2014-07-15 SURGERY — EVACUATION HEMATOMA
Anesthesia: General | Site: Nose | Laterality: Right

## 2014-07-15 MED ORDER — LACTATED RINGERS IV SOLN
INTRAVENOUS | Status: DC | PRN
  Administered 2014-07-15: 16:00:00 via INTRAVENOUS

## 2014-07-15 MED ORDER — EPHEDRINE SULFATE 50 MG/ML IJ SOLN
INTRAMUSCULAR | Status: DC | PRN
  Administered 2014-07-15: 5 mg via INTRAVENOUS

## 2014-07-15 MED ORDER — PROPOFOL 10 MG/ML IV BOLUS
INTRAVENOUS | Status: DC | PRN
  Administered 2014-07-15: 80 mg via INTRAVENOUS

## 2014-07-15 MED ORDER — SODIUM CHLORIDE 0.9 % IV SOLN
INTRAVENOUS | Status: DC
  Administered 2014-07-15 – 2014-07-17 (×2): via INTRAVENOUS

## 2014-07-15 MED ORDER — LEVOTHYROXINE SODIUM 75 MCG PO TABS: 75.0000 ug | ORAL_TABLET | ORAL | Status: DC

## 2014-07-15 MED ORDER — ONDANSETRON HCL 4 MG/2ML IJ SOLN
INTRAMUSCULAR | Status: AC
  Filled 2014-07-15: qty 2

## 2014-07-15 MED ORDER — SODIUM CHLORIDE 0.9 % IV SOLN: Freq: Once | INTRAVENOUS | Status: DC

## 2014-07-15 MED ORDER — PROPOFOL 10 MG/ML IV BOLUS
INTRAVENOUS | Status: AC
  Filled 2014-07-15: qty 20

## 2014-07-15 MED ORDER — FENTANYL CITRATE (PF) 250 MCG/5ML IJ SOLN
INTRAMUSCULAR | Status: AC
  Filled 2014-07-15: qty 5

## 2014-07-15 MED ORDER — LEVOTHYROXINE SODIUM 50 MCG PO TABS
50.0000 ug | ORAL_TABLET | ORAL | Status: DC
  Administered 2014-07-15: 50 ug via ORAL
  Filled 2014-07-15 (×2): qty 1

## 2014-07-15 MED ORDER — PROMETHAZINE HCL 25 MG/ML IJ SOLN: 6.2500 mg | INTRAMUSCULAR | Status: DC | PRN

## 2014-07-15 MED ORDER — BACITRACIN ZINC 500 UNIT/GM EX OINT
TOPICAL_OINTMENT | CUTANEOUS | Status: AC
  Filled 2014-07-15: qty 28.35

## 2014-07-15 MED ORDER — LIDOCAINE HCL (CARDIAC) 20 MG/ML IV SOLN
INTRAVENOUS | Status: AC
  Filled 2014-07-15: qty 5

## 2014-07-15 MED ORDER — AMIODARONE HCL 100 MG PO TABS
50.0000 mg | ORAL_TABLET | Freq: Every day | ORAL | Status: DC
  Administered 2014-07-15 – 2014-07-22 (×8): 50 mg via ORAL
  Filled 2014-07-15 (×8): qty 1

## 2014-07-15 MED ORDER — LIDOCAINE-EPINEPHRINE 1 %-1:100000 IJ SOLN
INTRAMUSCULAR | Status: AC
  Filled 2014-07-15: qty 1

## 2014-07-15 MED ORDER — HYDROMORPHONE HCL 1 MG/ML IJ SOLN: 0.2500 mg | INTRAMUSCULAR | Status: DC | PRN

## 2014-07-15 MED ORDER — WARFARIN SODIUM 5 MG PO TABS: ORAL_TABLET | ORAL | Status: DC

## 2014-07-15 MED ORDER — OXYMETAZOLINE HCL 0.05 % NA SOLN
NASAL | Status: DC | PRN
  Administered 2014-07-15: 1 via TOPICAL

## 2014-07-15 MED ORDER — FENTANYL CITRATE (PF) 100 MCG/2ML IJ SOLN
INTRAMUSCULAR | Status: DC | PRN
  Administered 2014-07-15 (×3): 50 ug via INTRAVENOUS

## 2014-07-15 MED ORDER — GELATIN ABSORBABLE 12-7 MM EX MISC
CUTANEOUS | Status: DC | PRN
  Administered 2014-07-15: 1

## 2014-07-15 MED ORDER — LIDOCAINE HCL (CARDIAC) 20 MG/ML IV SOLN
INTRAVENOUS | Status: DC | PRN
  Administered 2014-07-15: 40 mg via INTRAVENOUS

## 2014-07-15 MED ORDER — OXYMETAZOLINE HCL 0.05 % NA SOLN
NASAL | Status: AC
  Filled 2014-07-15: qty 15

## 2014-07-15 MED ORDER — SODIUM CHLORIDE 0.9 % IR SOLN
Status: DC | PRN
  Administered 2014-07-15 (×2): 1000 mL

## 2014-07-15 MED ORDER — HEMOSTATIC AGENTS (NO CHARGE) OPTIME
TOPICAL | Status: DC | PRN
  Administered 2014-07-15: 1 via TOPICAL

## 2014-07-15 MED ORDER — SUCCINYLCHOLINE CHLORIDE 20 MG/ML IJ SOLN
INTRAMUSCULAR | Status: DC | PRN
  Administered 2014-07-15: 80 mg via INTRAVENOUS

## 2014-07-15 MED ORDER — LEVOTHYROXINE SODIUM 75 MCG PO TABS
75.0000 ug | ORAL_TABLET | ORAL | Status: DC
  Administered 2014-07-16: 75 ug via ORAL
  Filled 2014-07-15: qty 1

## 2014-07-15 MED ORDER — BACITRACIN ZINC 500 UNIT/GM EX OINT
TOPICAL_OINTMENT | CUTANEOUS | Status: DC | PRN
  Administered 2014-07-15: 1 via TOPICAL

## 2014-07-15 MED ORDER — PHENYLEPHRINE HCL 10 MG/ML IJ SOLN
10.0000 mg | INTRAVENOUS | Status: DC | PRN
  Administered 2014-07-15: 20 ug/min via INTRAVENOUS

## 2014-07-15 MED ORDER — ARTIFICIAL TEARS OP OINT
TOPICAL_OINTMENT | OPHTHALMIC | Status: DC | PRN
  Administered 2014-07-15: 1 via OPHTHALMIC

## 2014-07-15 MED ORDER — ONDANSETRON HCL 4 MG/2ML IJ SOLN
INTRAMUSCULAR | Status: DC | PRN
  Administered 2014-07-15: 4 mg via INTRAVENOUS

## 2014-07-15 MED ORDER — ROCURONIUM BROMIDE 50 MG/5ML IV SOLN
INTRAVENOUS | Status: AC
  Filled 2014-07-15: qty 1

## 2014-07-15 MED ORDER — LIDOCAINE-EPINEPHRINE 1 %-1:100000 IJ SOLN
INTRAMUSCULAR | Status: DC | PRN
  Administered 2014-07-15: 10 mL

## 2014-07-15 SURGICAL SUPPLY — 58 items
BANDAGE ESMARK 6X9 LF (GAUZE/BANDAGES/DRESSINGS) IMPLANT
BENZOIN TINCTURE PRP APPL 2/3 (GAUZE/BANDAGES/DRESSINGS) ×3 IMPLANT
BNDG ESMARK 6X9 LF (GAUZE/BANDAGES/DRESSINGS)
CANISTER SUCTION 2500CC (MISCELLANEOUS) ×3 IMPLANT
CLIP LIGATING EXTRA MED SLVR (CLIP) IMPLANT
CLIP LIGATING EXTRA SM BLUE (MISCELLANEOUS) ×3 IMPLANT
COAGULATOR SUCT SWTCH 10FR 6 (ELECTROSURGICAL) ×3 IMPLANT
COVER MAYO STAND STRL (DRAPES) ×6 IMPLANT
CUFF TOURNIQUET SINGLE 18IN (TOURNIQUET CUFF) IMPLANT
CUFF TOURNIQUET SINGLE 24IN (TOURNIQUET CUFF) IMPLANT
CUFF TOURNIQUET SINGLE 34IN LL (TOURNIQUET CUFF) IMPLANT
CUFF TOURNIQUET SINGLE 44IN (TOURNIQUET CUFF) IMPLANT
DRAIN SNY 10X20 3/4 PERF (WOUND CARE) IMPLANT
DRAIN WOUND SNY 15 RND (WOUND CARE) ×3 IMPLANT
DRAPE ORTHO SPLIT 77X108 STRL (DRAPES) ×1
DRAPE SURG ORHT 6 SPLT 77X108 (DRAPES) ×2 IMPLANT
DRAPE X-RAY CASS 24X20 (DRAPES) IMPLANT
DRSG COVADERM 4X8 (GAUZE/BANDAGES/DRESSINGS) IMPLANT
DRSG TELFA 3X8 NADH (GAUZE/BANDAGES/DRESSINGS) ×6 IMPLANT
ELECT REM PT RETURN 9FT ADLT (ELECTROSURGICAL) ×3
ELECTRODE REM PT RTRN 9FT ADLT (ELECTROSURGICAL) ×2 IMPLANT
EVACUATOR SILICONE 100CC (DRAIN) ×3 IMPLANT
GAUZE SPONGE 4X4 12PLY STRL (GAUZE/BANDAGES/DRESSINGS) ×3 IMPLANT
GEL ULTRASOUND 20GR AQUASONIC (MISCELLANEOUS) IMPLANT
GLOVE SS BIOGEL STRL SZ 7.5 (GLOVE) ×2 IMPLANT
GLOVE SUPERSENSE BIOGEL SZ 7.5 (GLOVE) ×1
GOWN STRL REUS W/ TWL LRG LVL3 (GOWN DISPOSABLE) ×6 IMPLANT
GOWN STRL REUS W/TWL LRG LVL3 (GOWN DISPOSABLE) ×3
HEMOSTAT NU-KNIT SURGICAL 3X4 (HEMOSTASIS) ×3 IMPLANT
KIT BASIN OR (CUSTOM PROCEDURE TRAY) ×3 IMPLANT
KIT ROOM TURNOVER OR (KITS) ×3 IMPLANT
NEEDLE HYPO 25GX1X1/2 BEV (NEEDLE) ×3 IMPLANT
NS IRRIG 1000ML POUR BTL (IV SOLUTION) ×3 IMPLANT
PACK PERIPHERAL VASCULAR (CUSTOM PROCEDURE TRAY) ×3 IMPLANT
PAD ABD 8X10 STRL (GAUZE/BANDAGES/DRESSINGS) ×3 IMPLANT
PAD ARMBOARD 7.5X6 YLW CONV (MISCELLANEOUS) ×6 IMPLANT
PADDING CAST COTTON 6X4 STRL (CAST SUPPLIES) IMPLANT
PATTIES SURGICAL 1/4 X 3 (GAUZE/BANDAGES/DRESSINGS) ×3 IMPLANT
SET COLLECT BLD 21X3/4 12 (NEEDLE) IMPLANT
SPONGE GAUZE 4X4 12PLY STER LF (GAUZE/BANDAGES/DRESSINGS) ×3 IMPLANT
SPONGE SURGIFOAM ABS GEL 100 (HEMOSTASIS) ×3 IMPLANT
STAPLER VISISTAT 35W (STAPLE) IMPLANT
STOPCOCK 4 WAY LG BORE MALE ST (IV SETS) IMPLANT
STRIP CLOSURE SKIN 1/2X4 (GAUZE/BANDAGES/DRESSINGS) ×3 IMPLANT
SURGIFLO W/THROMBIN 8M KIT (HEMOSTASIS) ×3 IMPLANT
SUT ETHILON 3 0 PS 1 (SUTURE) ×3 IMPLANT
SUT PROLENE 5 0 C 1 24 (SUTURE) IMPLANT
SUT PROLENE 6 0 CC (SUTURE) IMPLANT
SUT VIC AB 2-0 CTX 36 (SUTURE) IMPLANT
SUT VIC AB 3-0 SH 27 (SUTURE) ×1
SUT VIC AB 3-0 SH 27X BRD (SUTURE) ×2 IMPLANT
SYR CONTROL 10ML LL (SYRINGE) ×3 IMPLANT
TAPE CLOTH SURG 4X10 WHT LF (GAUZE/BANDAGES/DRESSINGS) ×3 IMPLANT
TRAY FOLEY W/METER SILVER 16FR (SET/KITS/TRAYS/PACK) IMPLANT
TUBING EXTENTION W/L.L. (IV SETS) IMPLANT
UNDERPAD 30X30 INCONTINENT (UNDERPADS AND DIAPERS) ×3 IMPLANT
WATER STERILE IRR 1000ML POUR (IV SOLUTION) ×3 IMPLANT
YANKAUER SUCT BULB TIP NO VENT (SUCTIONS) ×3 IMPLANT

## 2014-07-15 NOTE — Progress Notes (Signed)
Patient ID: Kathryn Gibson, female   DOB: [birth year], 81 y.o.   MRN: [id number]    Dr Estanislado Pandy has seen and examined pt CT pelvis does reveal large hematoma See dimensions in report  US doppler of Rt groin does not reveal pseudoaneurysm or fistula  Rt groin: skin break down with noted blisters . Hematoma obvious and tender; firm to touch Rt foot: 2+ pulses   Dr Early Vascular surgeon notified for consult per Dr Estanislado Pandy.

## 2014-07-15 NOTE — Anesthesia Postprocedure Evaluation (Signed)
  Anesthesia Post-op Note  Patient: Kathryn Gibson  Procedure(s) Performed: Procedure(s): EVACUATION Right Groin Hematoma (Right) NASAL ENDOSCOPY WITH EPISTAXIS CONTROL (Bilateral)  Patient Location: PACU  Anesthesia Type: General   Level of Consciousness: awake, alert  and oriented  Airway and Oxygen Therapy: Patient Spontanous Breathing  Post-op Pain: none  Post-op Assessment: Post-op Vital signs reviewed  Post-op Vital Signs: Reviewed  Last Vitals:  Filed Vitals:   07/15/14 1900  BP: 142/51  Pulse: 78  Temp:   Resp: 20    Complications: No apparent anesthesia complications

## 2014-07-15 NOTE — H&P (Signed)
07/15/2014  Kathryn Gibson  Interval PREOPERATIVE HISTORY AND PHYSICAL  CHIEF COMPLAINT: recurrent/persistent COumadin-related left epistaxis  HISTORY: This is a 81 year old who presented with recurrent left severe refractory Coumadin-related epistaxis. Has had left sinus surgery with sphenopalatine artery cautery as well as cautery with packing x 4 total with bilateral internal maxillary artery embolization and has persistent left sided oozing. She also has an ~ 11.4 x 5.3cm right groin hematoma and Vascular surgery is taking to OR to drain. Patient is still complaining of left oozing so will take to OR for revision left nasal endoscopy with cautery and revision packing. Dr. Simeon Craft, Alroy Dust has discussed the risks (anesthesia risks, recurrent bleeding, stroke, death, anoxic brain injury, etc.), benefits, and alternatives of this procedure. The patient understands the risks and would like to proceed with the procedure. The chances of success of the procedure are >25% and the patient understands this. I personally performed an examination of the patient within 24 hours of the procedure.  PAST MEDICAL HISTORY: Past Medical History  Diagnosis Date  . Atrial fibrillation   . Hypertension   . Hypothyroidism   . Irritable bowel syndrome (IBS)   . Constipation   . Colonic polyp     adenomatous  . Diverticulosis   . Bradycardia     s/p PPM  . Hemorrhoids   . Arthritis   . Cataract     PAST SURGICAL HISTORY: Past Surgical History  Procedure Laterality Date  . Pacemaker insertion  2010    implanted by Dr Doreatha Lew ([facility])  . Colonoscopy    . Nasal endoscopy with epistaxis control N/A 07/14/2014    Procedure: NASAL ENDOSCOPY WITH EPISTAXIS CONTROL;  Surgeon: Kathryn Cola, MD;  Location: [facility] OR;  Service: ENT;  Laterality: N/A;  . Radiology with anesthesia N/A 07/14/2014    Procedure: RADIOLOGY WITH ANESTHESIA;  Surgeon: Medication Radiologist, MD;  Location: [facility];  Service: Radiology;  Laterality:  N/A;    MEDICATIONS: Scheduled Meds: . sodium chloride   Intravenous Once  . [MAR Hold] amiodarone  50 mg Oral Daily  . [MAR Hold]  ceFAZolin (ANCEF) IV  1 g Intravenous Q12H  . Glycerin (Adult)  1 suppository Rectal Once  . [MAR Hold] levothyroxine  50 mcg Oral QODAY  . [MAR Hold] levothyroxine  75 mcg Oral QODAY  . [MAR Hold] polyvinyl alcohol  1 drop Both Eyes Daily  . [MAR Hold] sodium chloride  3 mL Intravenous Q12H   Continuous Infusions: . niCARDipine 5 mg/hr (07/14/14 2217)   PRN Meds:.[MAR Hold] acetaminophen **OR** [MAR Hold] acetaminophen, [MAR Hold] LORazepam, [MAR Hold] metoprolol, [MAR Hold]  morphine injection, [MAR Hold] ondansetron (ZOFRAN) IV   ALLERGIES: No Known Allergies  SOCIAL HISTORY: History   Social History  . Marital Status: Widowed    Spouse Name: N/A  . Number of Children: 3  . Years of Education: N/A   Occupational History  . retired    Social History Main Topics  . Smoking status: Former Smoker    Types: Cigarettes    Quit date: 02/09/1980  . Smokeless tobacco: Never Used  . Alcohol Use: 8.4 oz/week    14 Glasses of wine per week  . Drug Use: No  . Sexual Activity: Not on file   Other Topics Concern  . Not on file   Social History Narrative    FAMILY HISTORY:  REVIEW OF SYSTEMS:  HEENT: left epistaxis, right groin pain, otherwise negative x 12 systems except per HPI   PHYSICAL EXAM:  GENERAL:  NAD VITAL SIGNS:  Filed Vitals:   07/15/14 1420  BP: 147/58  Pulse:   Temp: 98.3 F (36.8 C)  Resp:    SKIN:  Warm, dry HEENT:  Oral cavity clear, left sided meroceles in place soaked with sanguinous drainage and mild oozing. ABDOMEN/MUSCULOSKELETAL:  Right groin hematoma PSYCH:  Normal affect NEUROLOGIC:  CN 2-12 intact and symmetric  DIAGNOSTIC STUDIES: hemoglobin down to ~6, CT abdomen shows 11.4x5.3cm right groin hematoma.  ASSESSMENT AND PLAN: Plan to proceed with revision nasal endoscopy with control of epistaxis  and packing. Patient understands the risks, benefits, and alternatives. Informed written consent signed, witnessed, and on chart. 07/15/2014  3:31 PM Kathryn Gibson

## 2014-07-15 NOTE — Progress Notes (Signed)
Right groin ultrasound evaluation completed.  Negative for pseudoaneurysm and A-V fistula.

## 2014-07-15 NOTE — Progress Notes (Addendum)
Subjective: POD#1 from left sinus surgery, cautery, and cautery of left sphenopalatine artery, packing x 3, and IR embolization of bilateral internal maxillary arteries. I was called to bedside for persistent mild left sided oozing/drip pad staining. Patient states she feels like there are clots in the back of her nose and she feels like "she is drowning" but she is not actually spitting up any clots.  Objective: Vital signs in last 24 hours: Temp:  [97.8 F (36.6 C)-99.8 F (37.7 C)] 98.8 F (37.1 C) (06/06 1220) Pulse Rate:  [59-76] 72 (06/06 1205) Resp:  [13-26] 19 (06/06 1205) BP: (60-170)/(30-68) 139/54 mmHg (06/06 1220) SpO2:  [94 %-100 %] 97 % (06/06 1205)  Oral cavity clear, EOMI, PERRLA, alert and oriented. Lying flat as she has a weight on a right groin hematoma from her catherization. Right nostril hemostatic with minimal surgiflo. Left nostril packed with two side by side 10cm meroceles secured to face, soaked with sanguinous drainage, with some mild sanguinous staining on her nasal drip pad. She is receiving a blood transfusion.  @LABLAST2 (wbc:2,hgb:2,hct:2,plt:2)  Recent Labs  07/14/14 0859 07/15/14 0150  NA 139 142  K 3.8 3.9  CL 102 110  CO2 28 26  GLUCOSE 100* 139*  BUN 15 9  CREATININE 0.74 0.70  CALCIUM 9.2 7.6*    Medications:  Scheduled Meds: . sodium chloride   Intravenous Once  . amiodarone  50 mg Oral Daily  .  ceFAZolin (ANCEF) IV  1 g Intravenous Q12H  . Glycerin (Adult)  1 suppository Rectal Once  . levothyroxine  50 mcg Oral QODAY  . [START ON 07/16/2014] levothyroxine  75 mcg Oral QODAY  . polyvinyl alcohol  1 drop Both Eyes Daily  . sodium chloride  3 mL Intravenous Q12H   Continuous Infusions: . sodium chloride 75 mL/hr at 07/15/14 1200  . niCARDipine 5 mg/hr (07/14/14 2217)   PRN Meds:.acetaminophen **OR** acetaminophen, LORazepam, metoprolol, morphine injection, ondansetron (ZOFRAN) IV  Assessment/Plan: I had a lengthy discussion with  the patient regarding her refractory left epistaxis.   She is getting a stat CT abdomen and [place] left groin to assess the hematoma and apparently a Vascular surgery consult. I discussed at length that this sort of refractory Coumadin-related couagulopathy/epistaxis can be extremely difficult to control. She has had maximal surgery, bilateral internal maxillary embolizations, two episodes of general anesthesia in the past 24 hours, and 4 sets of packing counting the ER packing she came in with. I discussed the options, which would be observation and changing the drip pad as needed and not panicking over mild oozing or dripping, removing the packs at the bedside and placing new 10cm meroceles, which may not be effective, will cause additional nasal trauma, and may not stop the bleeding, taking her back to the OR for removal of her packing, repeat cautery, and placement of new packing, or speaking with [facility] to see if they would recommend any additional or alternative treatments. She wants to wait until her abdominal/groin imaging is done to see if she needs to go to the OR with vascular surgery for treatment of her groin hematoma and reassess at that time. We will await her imaging and reassess.   LOS: 1 day   Ruby Cola 07/15/2014, 12:42 PM

## 2014-07-15 NOTE — Transfer of Care (Signed)
Immediate Anesthesia Transfer of Care Note  Patient: Kathryn Gibson  Procedure(s) Performed: Procedure(s): EVACUATION Right Groin Hematoma (Right) NASAL ENDOSCOPY WITH EPISTAXIS CONTROL (Bilateral)  Patient Location: PACU  Anesthesia Type:General  Level of Consciousness: awake, alert  and oriented  Airway & Oxygen Therapy: Patient Spontanous Breathing and Patient connected to face mask oxygen  Post-op Assessment: Report given to RN and Post -op Vital signs reviewed and stable  Post vital signs: Reviewed and stable  Last Vitals:  Filed Vitals:   07/15/14 1545  BP:   Pulse:   Temp: 36.2 C  Resp:     Complications: No apparent anesthesia complications

## 2014-07-15 NOTE — Plan of Care (Signed)
Problem: Consults Goal: General Medical Patient Education See Patient Education Module for specific education. Outcome: Progressing Sandbag 10 lbs applied/kept  to groin hematoma. Pulses palpable. Continuing to monitor. Epistaxis packing intact and dressing reinforced as necessary.

## 2014-07-15 NOTE — Progress Notes (Signed)
[facility] TEAM 1 - Stepdown/ICU TEAM Progress Note  TYKISHA AREOLA EYC:[id number] DOB: 1933/02/10 DOA: 07/14/2014 PCP: Tivis Ringer, MD  Admit HPI / Brief Narrative: 81 y.o. F with a history of atrial fibrillation on chronic warfarin, hypothyroidism, and sick sinus syndrome status post pacemaker placement who presented to the ED with recurrent epistaxis. She was going to the movies with her girlfriends on 5/30 when she tripped over a newly constructed concrete barrier in the front of a parking space. Unfortunately she landed on her face. She went to an urgent care center who x-rayed her face and found no fracture. On 6/03 the patient developed epistaxis and was seen in [place]. She left there at 2 AM on 6/4 with nasal packing in place. At approximately 2 AM on 6/5 her nose started bleeding again and soaked through the packing. She presented to [facility] ER. She was evaluated by Dr. Simeon Craft (ENT) who took her to the OR and embolized her left nare. Unfortunately shortly after the procedure she started to bleed profusely again. She was taken to IR for superselective embolization of Lt IMAX and infraorbital branches and Rt IMAX ,Infraorbital and nasal branches of facial artery using PVA particles.  Her initial INR was 1.73.Marland Kitchen  HPI/Subjective: Pt c/o ongoing discomfort in her nose, and in the R groin.  She denies cp, sob, n/v, or abdom pain.    Assessment/Plan:  Refractory epistaxis Care as per ENT  R groin hemorrhage at puncture site Care as per vascular surgery who has been consulted by interventional radiology  Acute blood loss anemia Threshold for further transfusion will be hemoglobin less than 7.0 - with ongoing bleeding from 2 different sources decision was made to transfuse with 2 units today when hemoglobin dropped to 6.4  Hypomagnesemia Replace and follow  Chronic afib In sinus rhythm at present  HTN Blood pressure reasonably controlled - avoid overcorrection in setting  of acute blood loss  Hypothyroidism Continue home Synthroid dose  SSS s/p pacer   Chronic constipation  Code Status: FULL Family Communication: no family present at time of exam Disposition Plan: SDU   Consultants: Vascular surgery ENT  Antibiotics: Cefazolin 6/5 >  DVT prophylaxis: SCDs  Objective: Blood pressure 129/49, pulse 69, temperature 98.8 F (37.1 C), temperature source Axillary, resp. rate 21, height 5' 1.5" (1.562 m), weight 54.432 kg (120 lb), SpO2 97 %.  Intake/Output Summary (Last 24 hours) at 07/15/14 1205 Last data filed at 07/15/14 1100  Gross per 24 hour  Intake   5554 ml  Output    625 ml  Net   4929 ml   Exam: General: No acute respiratory distress Lungs: Clear to auscultation bilaterally without wheezes or crackles Cardiovascular: Regular rate and rhythm without murmur gallop or rub normal S1 and S2 Abdomen: Nontender, nondistended, soft, bowel sounds positive, no rebound, no ascites, no appreciable mass Extremities: No significant cyanosis, clubbing, or edema bilateral lower extremities - large hematoma in right groin extending into posterior aspect of right thigh   Data Reviewed: Basic Metabolic Panel:  Recent Labs Lab 07/14/14 0859 07/14/14 2034 07/15/14 0150  NA 139  --  142  K 3.8  --  3.9  CL 102  --  110  CO2 28  --  26  GLUCOSE 100*  --  139*  BUN 15  --  9  CREATININE 0.74  --  0.70  CALCIUM 9.2  --  7.6*  MG  --  1.6*  --  CBC:  Recent Labs Lab 07/14/14 0540 07/14/14 1613 07/14/14 2034 07/15/14 0150 07/15/14 1045  WBC 7.3 9.1 11.8* 8.9 11.6*  NEUTROABS  --   --   --  7.0  --   HGB 11.3* 12.0 8.6* 7.4* 6.4*  HCT 33.7* 35.5* 26.4* 22.2* 19.3*  MCV 87.5 88.3 90.1 89.5 89.4  PLT 220 246 186 171 155    Liver Function Tests: No results for input(s): AST, ALT, ALKPHOS, BILITOT, PROT, ALBUMIN in the last 168 hours. No results for input(s): LIPASE, AMYLASE in the last 168 hours. No results for input(s): AMMONIA  in the last 168 hours.  Coags:  Recent Labs Lab 07/12/14 2111 07/14/14 0540 07/14/14 2034 07/15/14 0150  INR 2.09* 1.73* 1.58* 1.30    Recent Labs Lab 07/15/14 0150  APTT 25    Studies:   Recent x-ray studies have been reviewed in detail by the Attending Physician  Scheduled Meds:  Scheduled Meds: . sodium chloride   Intravenous Once  . amiodarone  50 mg Oral Daily  .  ceFAZolin (ANCEF) IV  1 g Intravenous Q12H  . Glycerin (Adult)  1 suppository Rectal Once  . levothyroxine  50 mcg Oral QODAY  . [START ON 07/16/2014] levothyroxine  75 mcg Oral QODAY  . polyvinyl alcohol  1 drop Both Eyes Daily  . sodium chloride  3 mL Intravenous Q12H    Time spent on care of this patient: 35 mins   Alfretta Pinch T , MD   [facility] Office  [phone] Pager - Text Page per Shea Evans as per below:  On-Call/Text Page:      [email or web address]      password [email or web address]  If 7PM-7AM, please contact night-coverage [email or web address] Password [email or web address] 07/15/2014, 12:05 PM   LOS: 1 day

## 2014-07-15 NOTE — Op Note (Signed)
    OPERATIVE REPORT  DATE OF SURGERY: 07/15/2014  PATIENT: Kathryn Gibson, 81 y.o. female MRN: [id number]  DOB: 29-May-1933  PRE-OPERATIVE DIAGNOSIS: Large hematoma right medial thigh with threatened skin viability  POST-OPERATIVE DIAGNOSIS:  Same  PROCEDURE: Evacuation of large hematoma right medial thigh and placement of a 15 Blake drain  SURGEON:  Curt Jews, M.D.  PHYSICIAN ASSISTANT: Collins PA-C  ANESTHESIA:  Gen.  EBL: Minimal ml  Total I/O In: 1691.7 [P.O.:240; I.V.:600; Blood:626.7; Other:175; IV Piggyback:50] Out: 850 [Urine:750; Blood:100]  BLOOD ADMINISTERED: None  DRAINS: 15 [place] Blake drain  SPECIMEN: None  COUNTS CORRECT:  YES  PLAN OF CARE: Patient will undergo ENT evaluation with Dr. Simeon Craft in the operating room for ongoing nosebleed   PATIENT DISPOSITION:  PACU - hemodynamically stable  PROCEDURE DETAILS: Patient was taken to the operative placed supine position where the area of the right groin right leg prepped in sterile fashion. Patient very large hematoma in the medial aspect of the upper thigh. There was already of blistering and skin breakdown over the medial area this from pressure. An incision was made slightly anterior to the worse area of skin breakdown. This was approximately 2-3 cm below the groin puncture site at the groin crease. The large hematoma was entered. Large hematoma was evacuated. There was no ongoing bleeding. The hematoma was evacuated in its entirety. The resulting defect was irrigated with saline and hemostasis tablet cautery. A 15 bright Blake drain was brought through the inferior most portion of the hematoma cavity and secured to the skin with a 3-0 nylon stitch. The drain was placed and the resultant cavity. The subcutaneous tissue and skin were closed with 30 Vicryls suture. Bactroban ointment was placed on the skin where it was excoriated. Sterile dressing was applied. The patient was then reprepped and draped for Dr. Simeon Craft who  will dictate a separate note   Curt Jews, M.D. 07/15/2014 4:42 PM

## 2014-07-15 NOTE — Consult Note (Signed)
[facility] [place]  Reason for Consult:  Right groin hematoma Referring Physician:  Dr. Estanislado Pandy MRN #:  [id number]  History of Present Illness: This is a 81 y.o. female who we've been consulted on regarding a right groin hematoma. She is s/p bilateral common carotid and bilateral external carotid arteriograms followed superselective embolization of left IMAX and infraorbital branches and right IMAX ,Infraorbital and nasal branches of facial artery using PVA particles by IR on 07/14/14. She presented to the [facility] ED on 07/14/14 for recurrent epistaxis. She was previously seen at [facility] where she had packing performed.  She has a PMH of atrial fibrillation on coumadin and HTN.   Past Medical History  Diagnosis Date  . Atrial fibrillation   . Hypertension   . Hypothyroidism   . Irritable bowel syndrome (IBS)   . Constipation   . Colonic polyp     adenomatous  . Diverticulosis   . Bradycardia     s/p PPM  . Hemorrhoids   . Arthritis   . Cataract    Past Surgical History  Procedure Laterality Date  . Pacemaker insertion  2010    implanted by Dr Doreatha Lew ([facility])  . Colonoscopy    . Nasal endoscopy with epistaxis control N/A 07/14/2014    Procedure: NASAL ENDOSCOPY WITH EPISTAXIS CONTROL;  Surgeon: Ruby Cola, MD;  Location: [facility] OR;  Service: ENT;  Laterality: N/A;  . Radiology with anesthesia N/A 07/14/2014    Procedure: RADIOLOGY WITH ANESTHESIA;  Surgeon: Medication Radiologist, MD;  Location: [facility];  Service: Radiology;  Laterality: N/A;    No Known Allergies  Prior to Admission medications   Medication Sig Start Date End Date Taking? Authorizing Provider  acetaminophen (TYLENOL) 500 MG tablet Take 500-1,000 mg by mouth every 6 (six) hours as needed for moderate pain or headache.   Yes Historical Provider, MD  amiodarone (PACERONE) 100 MG tablet Take 50 mg by mouth daily.   Yes Historical Provider, MD  Ascorbic Acid (VITAMIN C) 1000 MG tablet  Take 1,000 mg by mouth daily.     Yes Historical Provider, MD  aspirin 81 MG tablet Take 81 mg by mouth daily.     Yes Historical Provider, MD  Calcium-Vitamin D (CALTRATE 600 PLUS-VIT D PO) Take 1 tablet by mouth daily.   Yes Historical Provider, MD  cholecalciferol (VITAMIN D) 1000 UNITS tablet Take 1,000 Units by mouth daily.     Yes Historical Provider, MD  levothyroxine (SYNTHROID, LEVOTHROID) 50 MCG tablet Take 72mcg by mouth every other day and take 75 mcg by mouth on alternate days   Yes Historical Provider, MD  LYSINE PO Take 1-2 tablets by mouth daily as needed (fever blister).   Yes Historical Provider, MD  metoprolol succinate (TOPROL-XL) 50 MG 24 hr tablet Take 1 tablet (50 mg total) by mouth daily. 04/16/14  Yes Darlin Coco, MD  Multiple Vitamin (MULTIVITAMIN) capsule Take 1 capsule by mouth daily.     Yes Historical Provider, MD  Omega-3 Fatty Acids (FISH OIL) 1200 MG CAPS Take 1 capsule by mouth daily. Mega Red.   Yes Historical Provider, MD  Polyethyl Glycol-Propyl Glycol (SYSTANE OP) Apply 1-2 drops to eye daily.   Yes Historical Provider, MD  polyethylene glycol powder (MIRALAX) powder Take two capfuls by mouth at bedtime   Yes Historical Provider, MD  Probiotic Product (ALIGN PO) Take 1 capsule by mouth daily.    Yes Historical Provider, MD  temazepam (RESTORIL) 30 MG capsule Take  30 mg by mouth at bedtime.    Yes Historical Provider, MD  verapamil (CALAN-SR) 240 MG CR tablet TAKE 1 BY MOUTH AT BEDTIME 05/27/14  Yes Darlin Coco, MD  warfarin (COUMADIN) 5 MG tablet Take as directed by coumadin clinic Patient taking differently: Take 2.5-5 mg by mouth daily. Take 5 mg Sunday, Monday, Wednesday, Friday and take 2.5 mg all other days 01/28/14   Darlin Coco, MD    History   Social History  . Marital Status: Widowed    Spouse Name: N/A  . Number of Children: 3  . Years of Education: N/A   Occupational History  . retired    Social History Main Topics  . Smoking  status: Former Smoker    Types: Cigarettes    Quit date: 02/09/1980  . Smokeless tobacco: Never Used  . Alcohol Use: 8.4 oz/week    14 Glasses of wine per week  . Drug Use: No  . Sexual Activity: Not on file   Other Topics Concern  . Not on file   Social History Narrative    Family History  Problem Relation Age of Onset  . Heart failure Mother   . Heart attack Father   . Colon cancer Neg Hx   . Diabetes Paternal Grandfather   . Kidney disease Neg Hx   . Liver disease Neg Hx   . Esophageal cancer Neg Hx   . Heart disease Brother   . Lung cancer Son     ROS: [x]  Positive   [ ]  Negative   [ ]  All sytems reviewed and are negative  Cardiovascular: []  chest pain/pressure []  palpitations []  SOB lying flat []  DOE []  pain in legs while walking []  pain in legs at rest []  pain in legs at night []  non-healing ulcers []  hx of DVT []  swelling in legs  Pulmonary: []  productive cough []  asthma/wheezing []  home O2  Neurologic: []  weakness in []  arms []  legs []  numbness in []  arms []  legs []  hx of CVA []  mini stroke [] difficulty speaking or slurred speech []  temporary loss of vision in one eye []  dizziness  Hematologic: []  hx of cancer []  bleeding problems []  problems with blood clotting easily  Endocrine:   []  diabetes []  thyroid disease  GI []  vomiting blood []  blood in stool  GU: []  CKD/renal failure []  HD--[]  M/W/F or []  T/T/S []  burning with urination []  blood in urine  Psychiatric: []  anxiety []  depression  Musculoskeletal: []  arthritis []  joint pain  Integumentary: []  rashes []  ulcers  Constitutional: []  fever []  chills   Physical Examination  Filed Vitals:   07/15/14 1420  BP: 147/58  Pulse:   Temp: 98.3 F (36.8 C)  Resp:    Body mass index is 22.31 kg/(m^2).  General:  WDWN in NAD Gait: Not observed HENT: WNL, normocephalic Pulmonary: normal non-labored breathing, without Rales, rhonchi,  wheezing Cardiac: nml s1  s2 Abdomen: soft, NT/ND, no masses Vascular Exam/Pulses: 2+ radial pulses bilaterally. Large hematoma to right groin with blistering and thinning of skin. 2+ DP pulses bilaterally.  Extremities: without ischemic changes, without Gangrene , without cellulitis; without open wounds;  Musculoskeletal: no muscle wasting or atrophy  Neurologic: A&O X 3; Appropriate Affect ; SENSATION: normal; MOTOR FUNCTION:  moving all extremities equally. Speech is fluent/normal  CBC    Component Value Date/Time   WBC 11.6* 07/15/2014 1045   RBC 2.16* 07/15/2014 1045   HGB 6.4* 07/15/2014 1045   HCT 19.3* 07/15/2014 1045  PLT 155 07/15/2014 1045   MCV 89.4 07/15/2014 1045   MCH 29.6 07/15/2014 1045   MCHC 33.2 07/15/2014 1045   RDW 14.9 07/15/2014 1045   LYMPHSABS 0.9 07/15/2014 0150   MONOABS 0.9 07/15/2014 0150   EOSABS 0.0 07/15/2014 0150   BASOSABS 0.0 07/15/2014 0150    BMET    Component Value Date/Time   NA 142 07/15/2014 0150   K 3.9 07/15/2014 0150   CL 110 07/15/2014 0150   CO2 26 07/15/2014 0150   GLUCOSE 139* 07/15/2014 0150   BUN 9 07/15/2014 0150   CREATININE 0.70 07/15/2014 0150   CALCIUM 7.6* 07/15/2014 0150   GFRNONAA >60 07/15/2014 0150   GFRAA >60 07/15/2014 0150    COAGS: Lab Results  Component Value Date   INR 1.30 07/15/2014   INR 1.58* 07/14/2014   INR 1.73* 07/14/2014    ASSESSMENT: This is a 81 y.o. female with large right groin hematoma.  PLAN: The hematoma is not actively expanding. CT pelvis reveals no intraperitoneal or retroperitoneal hemorrhage in the pelvis. [place] of right groin reveals no pseudoaneurysm or fistula. She is starting to have significant skin breakdown from the tension of the hematoma. Will take to OR for evacuation of hematoma. Dr. Simeon Craft will also be seeing her in the OR for removal of packing and possible repeat cautery and new packing.    Virgina Jock, PA-C [facility] Office: [phone] Pager: [phone]  I  have examined the patient, reviewed and agree with above. Concern regarding the viability of skin over this large hematoma. She has completely normal vasculature with completely normal dorsalis pedis and normal femoral pulse. Reviewed her CT scan which showed no evidence of retroperitoneal hematoma. I does have a large hematoma in the medial right thigh which is tenting the skin. On physical exam she is artery had a significant amount of breakdown and excoriation over the skin. Did the evacuation hematoma and probable placement of drain to reduce chance for ongoing skin problems. She may have wound issues despite this with the degree of maceration and bruising and the questionable viability of the skin. Will take to the operating room this evening for evacuation of hematoma. Discussed with Dr. Simeon Craft. Will likely hematoma that he will address the ongoing nasal bleeding  Curt Jews, MD 07/15/2014 3:59 PM

## 2014-07-15 NOTE — Op Note (Signed)
DATE OF OPERATION: 07/15/2014 Surgeon: Ruby Cola Procedure Performed:  Left endoscopic control of epistaxis/nasal endoscopy with cauterization 31238-Left  PREOPERATIVE DIAGNOSIS: recurrent epistaxis-left POSTOPERATIVE DIAGNOSIS: recurrent epistaxis-left  SURGEON: Ruby Cola ANESTHESIA: General endotracheal.  ESTIMATED BLOOD LOSS: less than 50 mL.  DRAINS/DRESSINGS: left nasal gelfoam followed by surgicel followed by surgiflo absorbable packing completely filling the left sinuses and nasal cavity. SPECIMENS: none INDICATIONS: The patient is a 81yo with a history of refractory multiply recurrent epistaxis-left, s/p nasal endoscopy with cautery/spehnopalatine artery ligation/packing x 4 with recurrence of left epistaxis. DESCRIPTION OF OPERATION: The patient was brought to the operating room and was placed in the supine position and was placed under general endotracheal anesthesia by anesthesiology. Dr. Donnetta Hutching from Vascular surgery drained a right groin hematoma. The patient's nose was inspected and after the left merocel packs x 2 were removed the nose was decongested with Afrin-coated pledgets which were then removed. The patient was prepped and draped in the usual sterile fashion. After the Afrin pledgets were removed, the inose was inspected with the 0 degree scope.  I first began on the left. The middle turbinate was deflected medially and the left maxillary, ethmoid, and sphenoid cavities were irrigated out and suctioned. I cauterized all visible excoriated nasal septal, nasal floor, inferior and middle turbinate, and sinus cavity mucosa until hemostasis was noted. Once hemostasis was noted and no active bleeding or excoriated/oozing mucosa was seen, I meticulously packed the superior nasal cavity and maxillary/ethmoid/and sphenoid sinuses on the left with saline-soaked gelfoam, and then inferior to the gelfoam I meticulously layered surgicel, and then inferior to and to hold the surgicel in  place I placed 70mL of surgiflo foam. I then covered the left nostril with telfa and tape. The stomach was suctioned out and the patient was turned back to anesthesia and awakened from anesthesia and extubated without difficulty. The patient tolerated the procedure well with no immediate complications and was taken to the postoperative recovery area in good condition.   Dr. Ruby Cola was present and performed the entire procedure. 07/15/2014  5:48 PM Ruby Cola

## 2014-07-15 NOTE — Care Management Note (Signed)
Case Management Note  Patient Details  Name: UMA JERDE MRN: [id number] Date of Birth: 07/23/1933  Subjective/Objective:     Pt adm on 07/14/14 with epistaxis.  PTA, pt resided at [facility].                 Action/Plan: Will follow for discharge needs as pt progresses.  May benefit from PT evaluation prior to dc.    Expected Discharge Date:   (unknown)               Expected Discharge Plan:  Home/Self Care  In-House Referral:     Discharge planning Services  CM Consult  Post Acute Care Choice:    Choice offered to:     DME Arranged:    DME Agency:     HH Arranged:    HH Agency:     Status of Service:  In process, will continue to follow  Medicare Important Message Given:    Date Medicare IM Given:    Medicare IM give by:    Date Additional Medicare IM Given:    Additional Medicare Important Message give by:     If discussed at [place] of Stay Meetings, dates discussed:    Additional Comments:  Reinaldo Raddle, RN, BSN  Trauma/Neuro ICU Case Manager [phone]

## 2014-07-15 NOTE — Progress Notes (Signed)
PT Cancellation Note  Patient Details Name: TYLENE QUASHIE MRN: [id number] DOB: 26-Nov-1933   Cancelled Treatment:    Reason Eval/Treat Not Completed: Patient not medically ready.  Hgb in the 6's among other complications.  Will see 6/7 as able. 07/15/2014  Donnella Sham, [place] [phone]  (pager)   Brendalyn Vallely, Tessie Fass 07/15/2014, 11:33 AM

## 2014-07-15 NOTE — Anesthesia Preprocedure Evaluation (Signed)
Anesthesia Evaluation  Patient identified by MRN, date of birth, ID band Patient awake    Reviewed: Allergy & Precautions, NPO status , Patient's Chart, lab work & pertinent test results  Airway Mallampati: II  TM Distance: >3 FB Neck ROM: Full    Dental no notable dental hx. (+) Dental Advisory Given   Pulmonary neg pulmonary ROS, former smoker,  breath sounds clear to auscultation  Pulmonary exam normal       Cardiovascular hypertension, Pt. on medications and Pt. on home beta blockers Normal cardiovascular exam+ dysrhythmias Atrial Fibrillation + pacemaker + Valvular Problems/Murmurs AI Rhythm:Regular Rate:Normal     Neuro/Psych negative neurological ROS  negative psych ROS   GI/Hepatic negative GI ROS, Neg liver ROS,   Endo/Other  Hypothyroidism   Renal/GU negative Renal ROS  negative genitourinary   Musculoskeletal  (+) Arthritis -,   Abdominal   Peds negative pediatric ROS (+)  Hematology negative hematology ROS (+)   Anesthesia Other Findings   Reproductive/Obstetrics negative OB ROS                             Anesthesia Physical  Anesthesia Plan  ASA: III and emergent  Anesthesia Plan: General   Post-op Pain Management:    Induction: Intravenous  Airway Management Planned: Oral ETT  Additional Equipment:   Intra-op Plan:   Post-operative Plan: Extubation in OR  Informed Consent: I have reviewed the patients History and Physical, chart, labs and discussed the procedure including the risks, benefits and alternatives for the proposed anesthesia with the patient or authorized representative who has indicated his/her understanding and acceptance.   Dental advisory given  Plan Discussed with: CRNA, Surgeon and Anesthesiologist  Anesthesia Plan Comments:         Anesthesia Quick Evaluation

## 2014-07-15 NOTE — Progress Notes (Signed)
Subjective: POD#1 from left nasal endoscopy with cautery and POD#0 from revision left nasal endoscopy with revision packing. telfa over left nasal cavity is dry, patient awake and alert.  Objective: Vital signs in last 24 hours: Temp:  [97.1 F (36.2 C)-99.8 F (37.7 C)] 97.1 F (36.2 C) (06/06 1545) Pulse Rate:  [59-76] 72 (06/06 1530) Resp:  [13-26] 23 (06/06 1530) BP: (60-170)/(30-68) 166/55 mmHg (06/06 1530) SpO2:  [94 %-100 %] 98 % (06/06 1530)  Right groin with drain from I&D right groin hematoma. Right nostril hemostatic, left nostril covered with Telfa over layered absorbable left nasal packing.  @LABLAST2 (wbc:2,hgb:2,hct:2,plt:2)  Recent Labs  07/14/14 0859 07/15/14 0150  NA 139 142  K 3.8 3.9  CL 102 110  CO2 28 26  GLUCOSE 100* 139*  BUN 15 9  CREATININE 0.74 0.70  CALCIUM 9.2 7.6*    Medications:  Scheduled Meds: . sodium chloride   Intravenous Once  . [MAR Hold] amiodarone  50 mg Oral Daily  . [MAR Hold]  ceFAZolin (ANCEF) IV  1 g Intravenous Q12H  . Glycerin (Adult)  1 suppository Rectal Once  . [MAR Hold] levothyroxine  50 mcg Oral QODAY  . [MAR Hold] levothyroxine  75 mcg Oral QODAY  . [MAR Hold] polyvinyl alcohol  1 drop Both Eyes Daily  . [MAR Hold] sodium chloride  3 mL Intravenous Q12H   Continuous Infusions: . niCARDipine 5 mg/hr (07/14/14 2217)   PRN Meds:.[MAR Hold] acetaminophen **OR** [MAR Hold] acetaminophen, HYDROmorphone (DILAUDID) injection, [MAR Hold] LORazepam, [MAR Hold] metoprolol, [MAR Hold]  morphine injection, [MAR Hold] ondansetron (ZOFRAN) IV, promethazine  Assessment/Plan: Stable POD#0 from revision left nasal endoscopy with cautery and absorbable packing. Would keep NPO for now until 24 hours of no significant bleeding (mild or minimal oozing is normal). Her left nasal packing is now all absorbable and will dissolve over several weeks. No nose blowing or straining x 3 weeks. I updated her daughter Mickel Baas by phone.   LOS: 1 day    Ruby Cola 07/15/2014, 5:59 PM

## 2014-07-15 NOTE — Progress Notes (Signed)
Referring Physician(s):  Kathryn Gibson  Subjective:  Epistaxis B IMAX and infraorbital braches embolized in IR 6/5 pm Pt still with some oozing from L nostril RN has discussed with ENT--plan to go back to OR per RN  Hg from 7.4 to 6.4 R groin hematoma is hard to palpate Larger inferiorly Tender Back pain Discussed with Kathryn Gibson Will order [place] Rt groin And CT pelvis without Cx   Allergies: Review of patient's allergies indicates no known allergies.  Medications: Prior to Admission medications   Medication Sig Start Date End Date Taking? Authorizing Provider  acetaminophen (TYLENOL) 500 MG tablet Take 500-1,000 mg by mouth every 6 (six) hours as needed for moderate pain or headache.   Yes Historical Provider, MD  amiodarone (PACERONE) 100 MG tablet Take 50 mg by mouth daily.   Yes Historical Provider, MD  Ascorbic Acid (VITAMIN C) 1000 MG tablet Take 1,000 mg by mouth daily.     Yes Historical Provider, MD  aspirin 81 MG tablet Take 81 mg by mouth daily.     Yes Historical Provider, MD  Calcium-Vitamin D (CALTRATE 600 PLUS-VIT D PO) Take 1 tablet by mouth daily.   Yes Historical Provider, MD  cholecalciferol (VITAMIN D) 1000 UNITS tablet Take 1,000 Units by mouth daily.     Yes Historical Provider, MD  levothyroxine (SYNTHROID, LEVOTHROID) 50 MCG tablet Take 33mcg by mouth every other day and take 75 mcg by mouth on alternate days   Yes Historical Provider, MD  LYSINE PO Take 1-2 tablets by mouth daily as needed (fever blister).   Yes Historical Provider, MD  metoprolol succinate (TOPROL-XL) 50 MG 24 hr tablet Take 1 tablet (50 mg total) by mouth daily. 04/16/14  Yes Darlin Coco, MD  Multiple Vitamin (MULTIVITAMIN) capsule Take 1 capsule by mouth daily.     Yes Historical Provider, MD  Omega-3 Fatty Acids (FISH OIL) 1200 MG CAPS Take 1 capsule by mouth daily. Mega Red.   Yes Historical Provider, MD  Polyethyl Glycol-Propyl Glycol (SYSTANE OP) Apply 1-2 drops to eye daily.   Yes  Historical Provider, MD  polyethylene glycol powder (MIRALAX) powder Take two capfuls by mouth at bedtime   Yes Historical Provider, MD  Probiotic Product (ALIGN PO) Take 1 capsule by mouth daily.    Yes Historical Provider, MD  temazepam (RESTORIL) 30 MG capsule Take 30 mg by mouth at bedtime.    Yes Historical Provider, MD  verapamil (CALAN-SR) 240 MG CR tablet TAKE 1 BY MOUTH AT BEDTIME 05/27/14  Yes Darlin Coco, MD  warfarin (COUMADIN) 5 MG tablet Take as directed by coumadin clinic Patient taking differently: Take 2.5-5 mg by mouth daily. Take 5 mg Sunday, Monday, Wednesday, Friday and take 2.5 mg all other days 01/28/14   Darlin Coco, MD     Vital Signs: BP 129/49 mmHg  Pulse 69  Temp(Src) 98.8 F (37.1 C) (Axillary)  Resp 21  Ht 5' 1.5" (1.562 m)  Wt 54.432 kg (120 lb)  BMI 22.31 kg/m2  SpO2 97%  Physical Exam  Constitutional: She is oriented to person, place, and time. She appears well-nourished.  HENT:  B nasal packing in place Oozing from L side  Cardiovascular: Normal rate and regular rhythm.   Abdominal:  Rt groin with enlarged hematoma Hematoma is palpable and hard to touch Painful to pt Noted at site and above travels to medial thigh   Musculoskeletal: Normal range of motion.  Rt foot 2+ pulses  Neurological: She is alert and  oriented to person, place, and time.  Skin: Skin is warm.  Psychiatric: She has a normal mood and affect. Her behavior is normal. Judgment and thought content normal.  Nursing note and vitals reviewed.   Imaging: No results found.  Labs:  CBC:  Recent Labs  07/14/14 1613 07/14/14 2034 07/15/14 0150 07/15/14 1045  WBC 9.1 11.8* 8.9 11.6*  HGB 12.0 8.6* 7.4* 6.4*  HCT 35.5* 26.4* 22.2* 19.3*  PLT 246 186 171 155    COAGS:  Recent Labs  07/12/14 2111 07/14/14 0540 07/14/14 2034 07/15/14 0150  INR 2.09* 1.73* 1.58* 1.30  APTT  --   --   --  25    BMP:  Recent Labs  07/14/14 0859 07/15/14 0150  NA  139 142  K 3.8 3.9  CL 102 110  CO2 28 26  GLUCOSE 100* 139*  BUN 15 9  CALCIUM 9.2 7.6*  CREATININE 0.74 0.70  GFRNONAA >60 >60  GFRAA >60 >60    LIVER FUNCTION TESTS: No results for input(s): BILITOT, AST, ALT, ALKPHOS, PROT, ALBUMIN in the last 8760 hours.  Assessment and Plan:  Epistaxis Continued oozing---to return to OR per Kathryn Gibson Rt groin hematoma: hard; enlarged; Hg from 7.4 to 6.4 Rt groin arterial doppler stat ; CT pelvis stat per Kathryn Gibson Pt aware of plan  Signed: Ludy Gibson A 07/15/2014, 11:59 AM   I spent a total of 25 Minutes in face to face in clinical consultation/evaluation, greater than 50% of which was counseling/coordinating care for epistaxis; Rt groin hematoma

## 2014-07-15 NOTE — Anesthesia Procedure Notes (Signed)
Procedure Name: Intubation Date/Time: 07/15/2014 4:11 PM Performed by: Susa Loffler Pre-anesthesia Checklist: Patient identified, Timeout performed, Emergency Drugs available, Patient being monitored and Suction available Patient Re-evaluated:Patient Re-evaluated prior to inductionOxygen Delivery Method: Circle system utilized Preoxygenation: Pre-oxygenation with 100% oxygen Intubation Type: IV induction, Rapid sequence and Cricoid Pressure applied Laryngoscope Size: Mac and 3 Grade View: Grade I Tube type: Oral Tube size: 7.0 mm Number of attempts: 1 Airway Equipment and Method: Stylet Placement Confirmation: ETT inserted through vocal cords under direct vision,  positive ETCO2 and breath sounds checked- equal and bilateral Secured at: 21 cm Tube secured with: Tape Dental Injury: Teeth and Oropharynx as per pre-operative assessment

## 2014-07-16 ENCOUNTER — Encounter (HOSPITAL_COMMUNITY): Payer: Self-pay | Admitting: Vascular Surgery

## 2014-07-16 DIAGNOSIS — I482 Chronic atrial fibrillation, unspecified: Secondary | ICD-10-CM | POA: Diagnosis present

## 2014-07-16 DIAGNOSIS — R040 Epistaxis: Principal | ICD-10-CM

## 2014-07-16 DIAGNOSIS — I480 Paroxysmal atrial fibrillation: Secondary | ICD-10-CM | POA: Diagnosis present

## 2014-07-16 DIAGNOSIS — T148 Other injury of unspecified body region: Secondary | ICD-10-CM

## 2014-07-16 DIAGNOSIS — D62 Acute posthemorrhagic anemia: Secondary | ICD-10-CM | POA: Diagnosis present

## 2014-07-16 DIAGNOSIS — I10 Essential (primary) hypertension: Secondary | ICD-10-CM | POA: Diagnosis present

## 2014-07-16 DIAGNOSIS — E038 Other specified hypothyroidism: Secondary | ICD-10-CM | POA: Diagnosis present

## 2014-07-16 LAB — COMPREHENSIVE METABOLIC PANEL
ALK PHOS: 43 U/L (ref 38–126)
ALT: 15 U/L (ref 14–54)
ALT: 16 U/L (ref 14–54)
AST: 27 U/L (ref 15–41)
AST: 35 U/L (ref 15–41)
Albumin: 2.9 g/dL — ABNORMAL LOW (ref 3.5–5.0)
Albumin: 3 g/dL — ABNORMAL LOW (ref 3.5–5.0)
Alkaline Phosphatase: 49 U/L (ref 38–126)
Anion gap: 7 (ref 5–15)
Anion gap: 7 (ref 5–15)
BILIRUBIN TOTAL: 0.8 mg/dL (ref 0.3–1.2)
BUN: 7 mg/dL (ref 6–20)
BUN: 5 mg/dL — AB (ref 6–20)
CHLORIDE: 103 mmol/L (ref 101–111)
CO2: 26 mmol/L (ref 22–32)
CO2: 27 mmol/L (ref 22–32)
CREATININE: 0.64 mg/dL (ref 0.44–1.00)
CREATININE: 0.59 mg/dL (ref 0.44–1.00)
Calcium: 7.8 mg/dL — ABNORMAL LOW (ref 8.9–10.3)
Calcium: 8.1 mg/dL — ABNORMAL LOW (ref 8.9–10.3)
Chloride: 104 mmol/L (ref 101–111)
GFR calc Af Amer: >60 mL/min (ref >60)
GFR calc Af Amer: >60 mL/min (ref >60)
Glucose, Bld: 116 mg/dL — ABNORMAL HIGH (ref 65–99)
Glucose, Bld: 117 mg/dL — ABNORMAL HIGH (ref 65–99)
Potassium: 3.4 mmol/L — ABNORMAL LOW (ref 3.5–5.1)
Potassium: 3.1 mmol/L — ABNORMAL LOW (ref 3.5–5.1)
Sodium: 137 mmol/L (ref 135–145)
Sodium: 137 mmol/L (ref 135–145)
TOTAL PROTEIN: 5.6 g/dL — AB (ref 6.5–8.1)
Total Bilirubin: 1.1 mg/dL (ref 0.3–1.2)
Total Protein: 4.9 g/dL — ABNORMAL LOW (ref 6.5–8.1)

## 2014-07-16 LAB — CBC WITH DIFFERENTIAL/PLATELET
BASOS PCT: 0 % (ref 0–1)
Basophils Absolute: 0 10*3/uL (ref 0.0–0.1)
EOS PCT: 0 % (ref 0–5)
Eosinophils Absolute: 0 10*3/uL (ref 0.0–0.7)
HCT: 27.9 % — ABNORMAL LOW (ref 36.0–46.0)
HEMOGLOBIN: 9.4 g/dL — AB (ref 12.0–15.0)
LYMPHS ABS: 1.5 10*3/uL (ref 0.7–4.0)
Lymphocytes Relative: 12 % (ref 12–46)
MCH: 28.5 pg (ref 26.0–34.0)
MCHC: 33.7 g/dL (ref 30.0–36.0)
MCV: 84.5 fL (ref 78.0–100.0)
Monocytes Absolute: 2.3 10*3/uL — ABNORMAL HIGH (ref 0.1–1.0)
Monocytes Relative: 19 % — ABNORMAL HIGH (ref 3–12)
NEUTROS ABS: 8.7 10*3/uL — AB (ref 1.7–7.7)
Neutrophils Relative %: 69 % (ref 43–77)
PLATELETS: 148 10*3/uL — AB (ref 150–400)
RBC: 3.3 MIL/uL — AB (ref 3.87–5.11)
RDW: 17.6 % — ABNORMAL HIGH (ref 11.5–15.5)
WBC: 12.5 10*3/uL — AB (ref 4.0–10.5)

## 2014-07-16 LAB — CBC
HCT: 25.7 % — ABNORMAL LOW (ref 36.0–46.0)
Hemoglobin: 8.8 g/dL — ABNORMAL LOW (ref 12.0–15.0)
MCH: 28.6 pg (ref 26.0–34.0)
MCHC: 34.2 g/dL (ref 30.0–36.0)
MCV: 83.4 fL (ref 78.0–100.0)
PLATELETS: 144 10*3/uL — AB (ref 150–400)
RBC: 3.08 MIL/uL — ABNORMAL LOW (ref 3.87–5.11)
RDW: 18.1 % — AB (ref 11.5–15.5)
WBC: 11.6 10*3/uL — ABNORMAL HIGH (ref 4.0–10.5)

## 2014-07-16 LAB — MAGNESIUM
MAGNESIUM: 1.9 mg/dL (ref 1.7–2.4)
Magnesium: 1.8 mg/dL (ref 1.7–2.4)

## 2014-07-16 LAB — BLOOD PRODUCT ORDER (VERBAL) VERIFICATION

## 2014-07-16 LAB — MRSA PCR SCREENING: MRSA by PCR: NEGATIVE

## 2014-07-16 MED ORDER — LEVOTHYROXINE SODIUM 50 MCG PO TABS
50.0000 ug | ORAL_TABLET | ORAL | Status: DC
  Administered 2014-07-17 – 2014-07-21 (×3): 50 ug via ORAL
  Filled 2014-07-16 (×4): qty 1

## 2014-07-16 MED ORDER — WHITE PETROLATUM GEL
Status: AC
  Administered 2014-07-16: 01:00:00
  Filled 2014-07-16: qty 1

## 2014-07-16 MED ORDER — METOPROLOL SUCCINATE ER 50 MG PO TB24
50.0000 mg | ORAL_TABLET | Freq: Every day | ORAL | Status: DC
  Administered 2014-07-16 – 2014-07-22 (×7): 50 mg via ORAL
  Filled 2014-07-16 (×7): qty 1

## 2014-07-16 MED ORDER — SILVER SULFADIAZINE 1 % EX CREA
TOPICAL_CREAM | Freq: Two times a day (BID) | CUTANEOUS | Status: DC
Start: 2014-07-16 — End: 2014-07-22
  Administered 2014-07-17 – 2014-07-18 (×4): via TOPICAL
  Administered 2014-07-19: 1 via TOPICAL
  Administered 2014-07-19 – 2014-07-20 (×2): via TOPICAL
  Administered 2014-07-20: 1 via TOPICAL
  Administered 2014-07-21 – 2014-07-22 (×3): via TOPICAL
  Filled 2014-07-16: qty 85

## 2014-07-16 MED ORDER — DEXTROSE 5 % IV SOLN
5.0000 mg/h | INTRAVENOUS | Status: DC
  Administered 2014-07-16: 15 mg/h via INTRAVENOUS
  Administered 2014-07-16: 5 mg/h via INTRAVENOUS
  Administered 2014-07-17: 15 mg/h via INTRAVENOUS
  Filled 2014-07-16 (×2): qty 100

## 2014-07-16 MED ORDER — LEVOTHYROXINE SODIUM 75 MCG PO TABS
75.0000 ug | ORAL_TABLET | ORAL | Status: DC
  Administered 2014-07-18 – 2014-07-22 (×3): 75 ug via ORAL
  Filled 2014-07-16 (×5): qty 1

## 2014-07-16 NOTE — Evaluation (Signed)
Physical Therapy Evaluation Patient Details Name: Kathryn Gibson MRN: [id number] DOB: [birth year] Today's Date: 07/16/2014   History of Present Illness  This is a 81 year old who presented with recurrent left severe refractory Coumadin-related epistaxis s/p Left endoscopic control of epistaxis/nasal endoscopy with cauterization and evac of hematoma.  Clinical Impression  Pt admitted with/for epistaxis s/p sinus sx for repair.  Pt currently limited functionally due to the problems listed. ( See problems list.)   Pt will benefit from PT to maximize function and safety in order to get ready for next venue listed below.     Follow Up Recommendations Other (comment) ([facility] level rehab for a few days)    Equipment Recommendations  None recommended by PT    Recommendations for Other Services       Precautions / Restrictions Precautions Precautions: Fall      Mobility  Bed Mobility Overal bed mobility: Needs Assistance Bed Mobility: Rolling;Sidelying to Sit Rolling: Min guard Sidelying to sit: Min guard       General bed mobility comments: no assist needed  Transfers Overall transfer level: Needs assistance   Transfers: Sit to/from Stand Sit to Stand: Min guard         General transfer comment: use of UE's  Ambulation/Gait Ambulation/Gait assistance: Supervision Ambulation Distance (Feet): 200 Feet Assistive device: None Gait Pattern/deviations: Step-through pattern Gait velocity: slower   General Gait Details: guarded and mildly unsteady, but able to scan her environment, change speed and back up without LOB  Stairs            Wheelchair Mobility    Modified Rankin (Stroke Patients Only)       Balance Overall balance assessment: Needs assistance Sitting-balance support: No upper extremity supported Sitting balance-Leahy Scale: Good     Standing balance support: No upper extremity supported Standing balance-Leahy Scale: Fair                                Pertinent Vitals/Pain Pain Assessment: Faces Faces Pain Scale: Hurts a little bit Pain Location: nose Pain Descriptors / Indicators: Discomfort Pain Intervention(s): Monitored during session    Home Living Family/patient expects to be discharged to:: Other (Comment) (rehab at [facility]) Living Arrangements: Alone Available Help at Discharge: Available PRN/intermittently Type of Home: Apartment Home Access: Level entry     Home Layout: One level Home Equipment: None      Prior Function Level of Independence: Independent               Hand Dominance        Extremity/Trunk Assessment   Upper Extremity Assessment: Overall WFL for tasks assessed           Lower Extremity Assessment: Overall WFL for tasks assessed (mild weakness)         Communication   Communication: No difficulties  Cognition Arousal/Alertness: Awake/alert Behavior During Therapy: WFL for tasks assessed/performed Overall Cognitive Status: Within Functional Limits for tasks assessed                      General Comments      Exercises        Assessment/Plan    PT Assessment Patient needs continued PT services  PT Diagnosis Generalized weakness;Other (comment) (decreased activity tolerance)   PT Problem List Decreased strength;Decreased activity tolerance;Decreased balance  PT Treatment Interventions Gait training;Stair training;Functional mobility training;Therapeutic activities;Balance training;Patient/family education  PT Goals (Current goals can be found in the Care Plan section) Acute Rehab PT Goals Patient Stated Goal: back to my active lifestyle PT Goal Formulation: With patient Time For Goal Achievement: 07/23/14 Potential to Achieve Goals: Good    Frequency Min 3X/week   Barriers to discharge Decreased caregiver support      Co-evaluation               End of Session   Activity Tolerance: Patient tolerated  treatment well;Other (comment) Patient left: in chair;with call bell/phone within reach Nurse Communication: Mobility status         Time: 2449-7530 PT Time Calculation (min) (ACUTE ONLY): 21 min   Charges:   PT Evaluation $Initial PT Evaluation Tier I: 1 Procedure     PT G Codes:        Justeen Hehr, Tessie Fass 07/16/2014, 6:43 PM 07/16/2014  Donnella Sham, [place] [phone]  (pager)

## 2014-07-16 NOTE — Progress Notes (Signed)
[phone]  Pt became tachycardic 120s, ?afib on monitor, pt alert, in NAD, reported that she felt heart beating faster but that she felt ok. BP stable SBP 130s. PVCs noted in lead V followed by 14 beat run of Vtach, bigeminy, and another 14-15 beat run of Vtach in lead V. Pt states her heart has periods of tachycardia at home as well. MD notified, amiodarone AM dose given as previously ordered. MD to notify cardiology. Pt states her cardiologist is Dr. Mare Ferrari.

## 2014-07-16 NOTE — Progress Notes (Addendum)
[place] TEAM 1 - Stepdown/ICU TEAM Progress Note  Kathryn Gibson [id number]:[id number] DOB: Oct 20, 1933 DOA: 07/14/2014 PCP: Tivis Ringer, MD  Admit HPI / Brief Narrative: 81 year old WF PMHx Coumadin for A-fib, sick sinus syndrome with pacer, HTN, hypothyroidism.  Presents with left epistaxis. Was apparently seen at [facility], had packing that did not work, EMT tried gauze, patient has had persistent epistaxis and ER consulted ENT for control. She now presents for endoscopy with control of epistaxis. Dr. Simeon Craft, Alroy Dust has discussed the risks (bleeding, infection, anesthesia risks, septal perforation, etc.), benefits, and alternatives of this procedure. The patient understands the risks and would like to proceed with the procedure. The chances of success of the procedure are >50% and the patient understands this. I personally performed an examination of the patient within 24 hours of the procedure.  HPI/Subjective: 6/7A/O 4, NAD, patient can feel her rapid heartbeat, negative SOB, negative CP  Assessment/Plan: Refractory epistaxis Care as per ENT  R groin hemorrhage at puncture site Care as per vascular surgery who has been consulted by interventional radiology  Acute blood loss anemia -6/6 transfused with 2 units PRBC -Transfuse for Hemoglobin< 7.  Hypomagnesemia Replace and follow  Chronic atrial fibrillation/Frequent PVC -Started Diltiazem gtt no  Bolus; Consulted cardiology -Continue Toprol 50 mg daily -Await cardiology recommendations  HTN -See chronic H fibrillation   Hypothyroidism -Continue home Synthroid dose  SSS s/p pacer   Chronic constipation    Code Status: FULL Family Communication: no family present at time of exam Disposition Plan: Per ENT/cardiology    Consultants: Vascular surgery ENT Cardiology   Procedure/Significant Events: 6/5 S/P bilateral common carotid and bilateral external carotid arteriograms followed superselective  embolization of Lt IMAX and infraorbital branches ,and Rt IMAX ,Infraorbital and nasal branches of facial artery using PVA particles 6/6 Evacuation of large hematoma right medial thigh and placement of a 15 Blake drain -6/6 transfused with 2 units PRBC   Culture   Antibiotics: Cefazolin 6/5 >  DVT prophylaxis: SCDs   Devices    LINES / TUBES:      Continuous Infusions: . sodium chloride 60 mL/hr at 07/15/14 2148    Objective: VITAL SIGNS: Temp: 99 F (37.2 C) (06/07 1134) Temp Source: Oral (06/07 1134) BP: 129/85 mmHg (06/07 1500) Pulse Rate: 153 (06/07 1500) SPO2; FIO2:   Intake/Output Summary (Last 24 hours) at 07/16/14 1627 Last data filed at 07/16/14 1500  Gross per 24 hour  Intake   1832 ml  Output   3150 ml  Net  -1318 ml     Exam: General: No acute respiratory distress Lungs: Clear to auscultation bilaterally without wheezes or crackles Cardiovascular: Regular rate and rhythm without murmur gallop or rub normal S1 and S2 Abdomen: Nontender, nondistended, soft, bowel sounds positive, no rebound, no ascites, no appreciable mass Extremities: No significant cyanosis, clubbing, or edema bilateral lower extremities - large hematoma in right groin extending into posterior aspect of right thigh  Psychiatric:  Negative depression, negative anxiety, negative fatigue, negative mania  Neurologic:  Cranial nerves II through XII intact, tongue/uvula midline, all extremities muscle strength 5/5, sensation intact throughout, negative dysarthria, negative expressive aphasia, negative receptive aphasia.     Data Reviewed: Basic Metabolic Panel:  Recent Labs Lab 07/14/14 0859 07/14/14 2034 07/15/14 0150 07/16/14 0225  NA 139  --  142 137  K 3.8  --  3.9 3.4*  CL 102  --  110 104  CO2 28  --  26 26  GLUCOSE 100*  --  139* 116*  BUN 15  --  9 7  CREATININE 0.74  --  0.70 0.64  CALCIUM 9.2  --  7.6* 7.8*  MG  --  1.6*  --  1.8   Liver Function  Tests:  Recent Labs Lab 07/16/14 0225  AST 27  ALT 15  ALKPHOS 43  BILITOT 0.8  PROT 4.9*  ALBUMIN 2.9*   No results for input(s): LIPASE, AMYLASE in the last 168 hours. No results for input(s): AMMONIA in the last 168 hours. CBC:  Recent Labs Lab 07/14/14 2034 07/15/14 0150 07/15/14 1045 07/15/14 2006 07/16/14 0225  WBC 11.8* 8.9 11.6* 12.7* 11.6*  NEUTROABS  --  7.0  --   --   --   HGB 8.6* 7.4* 6.4* 9.3* 8.8*  HCT 26.4* 22.2* 19.3* 27.0* 25.7*  MCV 90.1 89.5 89.4 83.6 83.4  PLT 186 171 155 137* 144*   Cardiac Enzymes: No results for input(s): CKTOTAL, CKMB, CKMBINDEX, TROPONINI in the last 168 hours. BNP (last 3 results) No results for input(s): BNP in the last 8760 hours.  ProBNP (last 3 results) No results for input(s): PROBNP in the last 8760 hours.  CBG: No results for input(s): GLUCAP in the last 168 hours.  No results found for this or any previous visit (from the past 240 hour(s)).   Studies:  Recent x-ray studies have been reviewed in detail by the Attending Physician  Scheduled Meds:  Scheduled Meds: . amiodarone  50 mg Oral Daily  .  ceFAZolin (ANCEF) IV  1 g Intravenous Q12H  . Glycerin (Adult)  1 suppository Rectal Once  . [START ON 07/17/2014] levothyroxine  50 mcg Oral QODAY   And  . [START ON 07/18/2014] levothyroxine  75 mcg Oral QODAY  . polyvinyl alcohol  1 drop Both Eyes Daily  . silver sulfADIAZINE   Topical BID  . sodium chloride  3 mL Intravenous Q12H    Time spent on care of this patient: 40 mins   Tanaiya Kolarik, Geraldo Docker , MD  [facility] Office  [phone] Pager - [phone]  On-Call/Text Page:      [email or web address]      password [email or web address]  If 7PM-7AM, please contact night-coverage [email or web address] Password [email or web address] 07/16/2014, 4:27 PM   LOS: 2 days   Care during the described time interval was provided by me .  I have reviewed this patient's available data, including medical history, events of note, physical examination, and all  test results as part of my evaluation. I have personally reviewed and interpreted all radiology studies.   Dia Crawford, MD [phone] Pager

## 2014-07-16 NOTE — Progress Notes (Addendum)
[facility]  Subjective  - Very sore, but the right groin area feels better over all since surgery.   Objective 131/53 71 99.3 F (37.4 C) (Oral) 22 93%  Intake/Output Summary (Last 24 hours) at 07/16/14 0749 Last data filed at 07/16/14 0700  Gross per 24 hour  Intake 2993.66 ml  Output   2150 ml  Net 843.66 ml    Drain in place out put 20 cc total Dressing in place, skin soft DP/PT palpable 2+ pulses right LE   Assessment/Planning: POD # 1 Evacuation of large hematoma right medial thigh and placement of a 15 Blake drain  Plan dressing changes tomorrow with silvadene daily to skin, not incision and cover with dry dressing Activity as tolerates per primary physician  Laurence Slate [facility] 07/16/2014 7:49 AM --  Laboratory Lab Results:  Recent Labs  07/15/14 2006 07/16/14 0225  WBC 12.7* 11.6*  HGB 9.3* 8.8*  HCT 27.0* 25.7*  PLT 137* 144*   BMET  Recent Labs  07/15/14 0150 07/16/14 0225  NA 142 137  K 3.9 3.4*  CL 110 104  CO2 26 26  GLUCOSE 139* 116*  BUN 9 7  CREATININE 0.70 0.64  CALCIUM 7.6* 7.8*    COAG Lab Results  Component Value Date   INR 1.30 07/15/2014   INR 1.58* 07/14/2014   INR 1.73* 07/14/2014   No results found for: PTT    I have examined the patient, reviewed and agree with above. Comfortable this morning. 2+ dorsalis pedis pulse on the right. Dressing intact with no large hematoma. Minimal output from her JP drain. Will probably discontinue this tomorrow. Can mobilize ad lib.  Curt Jews, MD 07/16/2014 8:40 AM

## 2014-07-16 NOTE — Consult Note (Signed)
CARDIOLOGY CONSULT NOTE   Patient ID: Kathryn Gibson MRN: [id number], DOB/AGE: 07/31/1933   Admit date: 07/14/2014 Date of Consult: 07/16/2014   Primary Physician: Tivis Ringer, MD Primary Cardiologist: Dr. Moise Boring. Allred  Pt. Profile  81 yo female with PMH of HTN, hypothyroidism, PAF on coumadin, and h/o tachybrady s/p Medtronic  PPM present with recurrent L sided epistaxis, s/p endoscopic cauterization x 2 by ENT and maxillary artery occlusion by IR. Hospital course complicated by R groing hematoma require evacuation by vascular surgery. Pt went into a-fib with RVR around 2:48pm on 6/7  Problem List  Past Medical History  Diagnosis Date  . Atrial fibrillation   . Hypertension   . Hypothyroidism   . Irritable bowel syndrome (IBS)   . Constipation   . Colonic polyp     adenomatous  . Diverticulosis   . Bradycardia     s/p PPM  . Hemorrhoids   . Arthritis   . Cataract     Past Surgical History  Procedure Laterality Date  . Pacemaker insertion  2010    implanted by Dr Doreatha Lew ([facility])  . Colonoscopy    . Nasal endoscopy with epistaxis control N/A 07/14/2014    Procedure: NASAL ENDOSCOPY WITH EPISTAXIS CONTROL;  Surgeon: Ruby Cola, MD;  Location: [facility] OR;  Service: ENT;  Laterality: N/A;  . Radiology with anesthesia N/A 07/14/2014    Procedure: RADIOLOGY WITH ANESTHESIA;  Surgeon: Medication Radiologist, MD;  Location: [place];  Service: Radiology;  Laterality: N/A;  . Hematoma evacuation Right 07/15/2014    Procedure: EVACUATION Right Groin Hematoma;  Surgeon: Rosetta Posner, MD;  Location: [facility];  Service: Vascular;  Laterality: Right;  . Nasal endoscopy with epistaxis control Bilateral 07/15/2014    Procedure: NASAL ENDOSCOPY WITH EPISTAXIS CONTROL;  Surgeon: Ruby Cola, MD;  Location: [facility] OR;  Service: ENT;  Laterality: Bilateral;     Allergies  No Known Allergies  HPI   The patient is a 81 yo female with PMH of HTN, hypothyroidism, PAF on coumadin, and h/o  tachybrady s/p Medtronic PPM. She has been followed by both Dr. Mare Ferrari and Dr. Rayann Heman. She has been taking amiodarone, Toprol-XL, verapamil and Coumadin for her atrial fibrillation. With amiodarone, patient has been maintaining sinus rhythm prior to this admission. She has been on Coumadin chronically without significant bleeding episode.  According to the patient, she had a fall last Monday on [place]. She hit her face and nose at the time. However she did not have significant bleeding until Friday 07/12/2014. She initially went to [facility] ER on 6/3, she had nasal packing in place. Unfortunately, she had recurrent left-sided epistaxis and sought medical attention at [facility]. She was seen by ENT who took her to the OR for endoscopic ligation of sphenopalatine artery. Postop, she had recurrent nosebleed. She was admitted to internal medicine service and was given FFP for reversal. She was also seen by interventional radiology service and underwent superselective embolization of bilateral IMAX and infraorbital branches using PVA particles. Post procedure, she developed enlarging right groin hematoma. Ultrasound did not reveal any sign of pseudoaneurysm or AV fistula. CT of the pelvis reveals a large hematoma. Vascular surgery was consulted, given the enlarging size of groin hematoma, she was taken to the OR for evacuation of blood clot. Due to recurrent bleed, she was taken back to the OR on 07/15/2014 for repeat left endoscopy control of epistaxis with cauterization. She was doing well until 07/16/2014 when she went  back into atrial fibrillation with RVR around 2:48 PM. Cardiology has been consulted for recurrent paroxysmal atrial fibrillation with RVR. Of note, she is currently off verapamil and Toprol-XL, IV diltiazem has been started.  Inpatient Medications  . amiodarone  50 mg Oral Daily  .  ceFAZolin (ANCEF) IV  1 g Intravenous Q12H  . Glycerin (Adult)  1 suppository Rectal Once  . [START ON  07/17/2014] levothyroxine  50 mcg Oral QODAY   And  . [START ON 07/18/2014] levothyroxine  75 mcg Oral QODAY  . polyvinyl alcohol  1 drop Both Eyes Daily  . silver sulfADIAZINE   Topical BID  . sodium chloride  3 mL Intravenous Q12H    Family History Family History  Problem Relation Age of Onset  . Heart failure Mother   . Heart attack Father   . Colon cancer Neg Hx   . Diabetes Paternal Grandfather   . Kidney disease Neg Hx   . Liver disease Neg Hx   . Esophageal cancer Neg Hx   . Heart disease Brother   . Lung cancer Son      Social History History   Social History  . Marital Status: Widowed    Spouse Name: N/A  . Number of Children: 3  . Years of Education: N/A   Occupational History  . retired    Social History Main Topics  . Smoking status: Former Smoker    Types: Cigarettes    Quit date: 02/09/1980  . Smokeless tobacco: Never Used  . Alcohol Use: 8.4 oz/week    14 Glasses of wine per week  . Drug Use: No  . Sexual Activity: Not on file   Other Topics Concern  . Not on file   Social History Narrative     Review of Systems  General:  No chills, fever, night sweats or weight changes.  Cardiovascular:  No chest pain, dyspnea on exertion, edema, orthopnea, palpitations, paroxysmal nocturnal dyspnea. +L nose bleed, R groin hematoma Dermatological: No rash, lesions/masses Respiratory: No cough, dyspnea Urologic: No hematuria, dysuria Abdominal:   No nausea, vomiting, diarrhea, bright red blood per rectum, melena, or hematemesis Neurologic:  No visual changes, wkns, changes in mental status. All other systems reviewed and are otherwise negative except as noted above.  Physical Exam  Blood pressure 142/100, pulse 139, temperature 99.1 F (37.3 C), temperature source Oral, resp. rate 20, height 5' 1.5" (1.562 m), weight 120 lb (54.432 kg), SpO2 96 %.  General: Pleasant, NAD Psych: Normal affect. Neuro: Alert and oriented X 3. Moves all extremities  spontaneously. HEENT: L nasal packing in place Neck: Supple without bruits or JVD. Lungs:  Resp regular and unlabored, CTA. Heart: irregular. Tachycardic. no s3, s4, or murmurs. Abdomen: Soft, non-tender, non-distended, BS + x 4.  R groin dressing in place.  Extremities: No clubbing, cyanosis or edema. DP/PT/Radials 2+ and equal bilaterally.  Labs  No results for input(s): CKTOTAL, CKMB, TROPONINI in the last 72 hours. Lab Results  Component Value Date   WBC 11.6* 07/16/2014   HGB 8.8* 07/16/2014   HCT 25.7* 07/16/2014   MCV 83.4 07/16/2014   PLT 144* 07/16/2014    Recent Labs Lab 07/16/14 0225  NA 137  K 3.4*  CL 104  CO2 26  BUN 7  CREATININE 0.64  CALCIUM 7.8*  PROT 4.9*  BILITOT 0.8  ALKPHOS 43  ALT 15  AST 27  GLUCOSE 116*    Radiology/Studies  Ct Pelvis Wo Contrast  07/15/2014  CLINICAL DATA:  Status post arteriographic procedures yesterday with embolization for treatment of uncontrolled epistaxis. There is drop in hemoglobin with clinical right groin/thigh hematoma.  EXAM: CT PELVIS WITHOUT CONTRAST  TECHNIQUE: Multidetector CT imaging of the pelvis was performed following the standard protocol without intravenous contrast.  COMPARISON:  None.  FINDINGS: There is a large focal hematoma of the right groin beginning below the inguinal ligament and extending into the medial proximal thigh. On axial images, maximal hematoma dimensions are approximately 5.3 x 11.5 cm. There is adjacent stranding in the fat. This hematoma appears to lie outside of the muscular compartments. There is no evidence of retroperitoneal or peritoneal hemorrhage in the pelvis. The bladder contains a Foley catheter. No incidental masses. Bony structures are within normal limits.  IMPRESSION: Focal hematoma of the right groin extending into the medial proximal thigh. Maximal dimensions are approximately 12 cm on axial images. No intraperitoneal or retroperitoneal hemorrhage within the pelvis.    Electronically Signed   By: Aletta Edouard M.D.   On: 07/15/2014 13:16    ECG  A-fib with RVR  ASSESSMENT AND PLAN (patient seen with Dr. Johnsie Cancel)  1. PAF with RVR  - CHA2DS2-Vasc score at least 4 (female, HTN, age)  - appears wide complex on telemetry, however unlikely to be vtach given lack of hemodynamic compromise. Likely a-fib with RVR   - continue amiodarone, uptitrate IV diltiazem to keep her HR <100 at rest  - restart PO Toprol XL. Likely will be off coumadin for 3-4 weeks  2. L sided epistaxis  - s/p endoscopic cauterization x 2 by ENT and maxillary artery occlusion by IR  3. R groin hematoma s/p evacuation by vascular on 6/6  - U/S large hematoma, negative for AV fistula or pseudoanuerysm  4. HTN 5. Hypothyroidism 6. history of tachybradycardia syndrome status post Medtronic pacemaker  Signed, Almyra Deforest, PA-C 07/16/2014, 5:38 PM;t

## 2014-07-16 NOTE — Progress Notes (Signed)
Referring Physician(s): Dr Simeon Craft  Subjective:  Epistaxis B IMAX and infraorbital branches embolization 6/5 Rt groin hematoma--to OR for evacuation 6/6 with Dr Donnetta Hutching and revision of L nasal cautery/packing at same time with Dr Simeon Craft Resting this am Packing L nares in place Rt groin evacuated with drain intact   Allergies: Review of patient's allergies indicates no known allergies.  Medications: Prior to Admission medications   Medication Sig Start Date End Date Taking? Authorizing Provider  acetaminophen (TYLENOL) 500 MG tablet Take 500-1,000 mg by mouth every 6 (six) hours as needed for moderate pain or headache.   Yes Historical Provider, MD  amiodarone (PACERONE) 100 MG tablet Take 50 mg by mouth daily.   Yes Historical Provider, MD  Ascorbic Acid (VITAMIN C) 1000 MG tablet Take 1,000 mg by mouth daily.     Yes Historical Provider, MD  aspirin 81 MG tablet Take 81 mg by mouth daily.     Yes Historical Provider, MD  Calcium-Vitamin D (CALTRATE 600 PLUS-VIT D PO) Take 1 tablet by mouth daily.   Yes Historical Provider, MD  cholecalciferol (VITAMIN D) 1000 UNITS tablet Take 1,000 Units by mouth daily.     Yes Historical Provider, MD  levothyroxine (SYNTHROID, LEVOTHROID) 50 MCG tablet Take 4mcg by mouth every other day and take 75 mcg by mouth on alternate days   Yes Historical Provider, MD  LYSINE PO Take 1-2 tablets by mouth daily as needed (fever blister).   Yes Historical Provider, MD  metoprolol succinate (TOPROL-XL) 50 MG 24 hr tablet Take 1 tablet (50 mg total) by mouth daily. 04/16/14  Yes Darlin Coco, MD  Multiple Vitamin (MULTIVITAMIN) capsule Take 1 capsule by mouth daily.     Yes Historical Provider, MD  Omega-3 Fatty Acids (FISH OIL) 1200 MG CAPS Take 1 capsule by mouth daily. Mega Red.   Yes Historical Provider, MD  Polyethyl Glycol-Propyl Glycol (SYSTANE OP) Apply 1-2 drops to eye daily.   Yes Historical Provider, MD  polyethylene glycol powder (MIRALAX) powder  Take two capfuls by mouth at bedtime   Yes Historical Provider, MD  Probiotic Product (ALIGN PO) Take 1 capsule by mouth daily.    Yes Historical Provider, MD  temazepam (RESTORIL) 30 MG capsule Take 30 mg by mouth at bedtime.    Yes Historical Provider, MD  verapamil (CALAN-SR) 240 MG CR tablet TAKE 1 BY MOUTH AT BEDTIME 05/27/14  Yes Darlin Coco, MD  warfarin (COUMADIN) 5 MG tablet Take as directed by coumadin clinic 07/15/14   Darlin Coco, MD     Vital Signs: BP 131/53 mmHg  Pulse 71  Temp(Src) 99.3 F (37.4 C) (Oral)  Resp 22  Ht 5' 1.5" (1.562 m)  Wt 54.432 kg (120 lb)  BMI 22.31 kg/m2  SpO2 93%  Physical Exam  Abdominal:  Rt groin sl tender to touch Flat and soft; ecchymotic Skin breakdown appears some better today Drain intact---bloody fluid Rt 2+ pulses  T max 100 last pm afeb this am Hg 8.8      Imaging: Ct Pelvis Wo Contrast  07/15/2014   CLINICAL DATA:  Status post arteriographic procedures yesterday with embolization for treatment of uncontrolled epistaxis. There is drop in hemoglobin with clinical right groin/thigh hematoma.  EXAM: CT PELVIS WITHOUT CONTRAST  TECHNIQUE: Multidetector CT imaging of the pelvis was performed following the standard protocol without intravenous contrast.  COMPARISON:  None.  FINDINGS: There is a large focal hematoma of the right groin beginning below the inguinal ligament and  extending into the medial proximal thigh. On axial images, maximal hematoma dimensions are approximately 5.3 x 11.5 cm. There is adjacent stranding in the fat. This hematoma appears to lie outside of the muscular compartments. There is no evidence of retroperitoneal or peritoneal hemorrhage in the pelvis. The bladder contains a Foley catheter. No incidental masses. Bony structures are within normal limits.  IMPRESSION: Focal hematoma of the right groin extending into the medial proximal thigh. Maximal dimensions are approximately 12 cm on axial images. No  intraperitoneal or retroperitoneal hemorrhage within the pelvis.   Electronically Signed   By: Aletta Edouard M.D.   On: 07/15/2014 13:16    Labs:  CBC:  Recent Labs  07/15/14 0150 07/15/14 1045 07/15/14 2006 07/16/14 0225  WBC 8.9 11.6* 12.7* 11.6*  HGB 7.4* 6.4* 9.3* 8.8*  HCT 22.2* 19.3* 27.0* 25.7*  PLT 171 155 137* 144*    COAGS:  Recent Labs  07/12/14 2111 07/14/14 0540 07/14/14 2034 07/15/14 0150  INR 2.09* 1.73* 1.58* 1.30  APTT  --   --   --  25    BMP:  Recent Labs  07/14/14 0859 07/15/14 0150 07/16/14 0225  NA 139 142 137  K 3.8 3.9 3.4*  CL 102 110 104  CO2 28 26 26   GLUCOSE 100* 139* 116*  BUN 15 9 7   CALCIUM 9.2 7.6* 7.8*  CREATININE 0.74 0.70 0.64  GFRNONAA >60 >60 >60  GFRAA >60 >60 >60    LIVER FUNCTION TESTS:  Recent Labs  07/16/14 0225  BILITOT 0.8  AST 27  ALT 15  ALKPHOS 43  PROT 4.9*  ALBUMIN 2.9*    Assessment and Plan:  Epistaxis B IMAX and infraorbital branches embolization 6/5 Rt groin hematoma evac 6/6 With re cautery and packing L nares Plan per Dr Simeon Craft and Dr Donnetta Hutching Will follow  Signed: Remiel Corti A 07/16/2014, 8:18 AM   I spent a total of 15 Minutes in face to face in clinical consultation/evaluation, greater than 50% of which was counseling/coordinating care for epistaxis

## 2014-07-16 NOTE — Progress Notes (Addendum)
Pt ambulated with PT. HR tachycardic not resolved with sitting in chair. Pt given prn Lopressor, BP stable 120s/60s. Pt feels the tachycardia in her chest but denies CP. Pt assisted back to bed. MD called. EKG ordered. No further orders at this time. Will continue to monitor and f/u with MD upon receipt of EKG.  Pt asked about Verapamil, MD notified.  Update: MD immediately contact with EKG results. HR irregular 110s-120s, MD aware. Pt same. No further orders received at this time. Will continue to monitor.

## 2014-07-17 ENCOUNTER — Telehealth: Payer: Self-pay

## 2014-07-17 DIAGNOSIS — I482 Chronic atrial fibrillation: Secondary | ICD-10-CM

## 2014-07-17 LAB — CBC
HEMATOCRIT: 31.8 % — AB (ref 36.0–46.0)
Hemoglobin: 10.6 g/dL — ABNORMAL LOW (ref 12.0–15.0)
MCH: 28.3 pg (ref 26.0–34.0)
MCHC: 33.3 g/dL (ref 30.0–36.0)
MCV: 85 fL (ref 78.0–100.0)
PLATELETS: 171 10*3/uL (ref 150–400)
RBC: 3.74 MIL/uL — ABNORMAL LOW (ref 3.87–5.11)
RDW: 16.9 % — ABNORMAL HIGH (ref 11.5–15.5)
WBC: 12.4 10*3/uL — AB (ref 4.0–10.5)

## 2014-07-17 MED ORDER — LEVOTHYROXINE SODIUM 50 MCG PO TABS
50.0000 ug | ORAL_TABLET | ORAL | Status: DC
Start: 2014-07-17 — End: 2014-07-17

## 2014-07-17 MED ORDER — SENNOSIDES-DOCUSATE SODIUM 8.6-50 MG PO TABS
1.0000 | ORAL_TABLET | Freq: Two times a day (BID) | ORAL | Status: DC
  Administered 2014-07-17 – 2014-07-22 (×9): 1 via ORAL
  Filled 2014-07-17 (×11): qty 1

## 2014-07-17 MED ORDER — POTASSIUM CHLORIDE CRYS ER 20 MEQ PO TBCR
40.0000 meq | EXTENDED_RELEASE_TABLET | Freq: Once | ORAL | Status: AC
  Administered 2014-07-17: 40 meq via ORAL
  Filled 2014-07-17: qty 2

## 2014-07-17 MED ORDER — POLYETHYLENE GLYCOL 3350 17 G PO PACK
17.0000 g | PACK | Freq: Two times a day (BID) | ORAL | Status: DC
  Administered 2014-07-17 – 2014-07-21 (×7): 17 g via ORAL
  Filled 2014-07-17 (×12): qty 1

## 2014-07-17 MED ORDER — DILTIAZEM HCL 60 MG PO TABS
60.0000 mg | ORAL_TABLET | Freq: Three times a day (TID) | ORAL | Status: DC
  Administered 2014-07-17 – 2014-07-18 (×3): 60 mg via ORAL
  Filled 2014-07-17 (×7): qty 1

## 2014-07-17 NOTE — Progress Notes (Signed)
[place] TEAM 1 - Stepdown/ICU TEAM Progress Note  Kathryn Gibson MCN:[id number] DOB: 07-Jul-1933 DOA: 07/14/2014 PCP: Tivis Ringer, MD  Admit HPI / Brief Narrative: 81 y.o. F with a history of atrial fibrillation on chronic warfarin, hypothyroidism, and sick sinus syndrome status post pacemaker placement who presented to the ED with recurrent epistaxis. She was going to the movies with her girlfriends on 5/30 when she tripped over a newly constructed concrete barrier in the front of a parking space. Unfortunately she landed on her face. She went to an urgent care center who x-rayed her face and found no fracture. On 6/03 the patient developed epistaxis and was seen in [place]. She left there at 2 AM on 6/4 with nasal packing in place. At approximately 2 AM on 6/5 her nose started bleeding again and soaked through the packing. She presented to [facility] ER. She was evaluated by Dr. Simeon Craft (ENT) who took her to the OR and embolized her left nare. Unfortunately shortly after the procedure she started to bleed profusely again. She was taken to IR for superselective embolization of Lt IMAX and infraorbital branches and Rt IMAX ,Infraorbital and nasal branches of facial artery using PVA particles.  Her initial INR was 1.73.Marland Kitchen  HPI/Subjective: The patient has no new complaints today report from being hungry.  She denies chest pain shortness of breath fevers chills nausea or vomiting.  She has not noted any further epistaxis.  She reports the pain in the right thigh is tolerable.  Assessment/Plan:  Refractory epistaxis Care as per ENT - appears to have resolved s/p second trip to OR - nasal packing is absorbable over 2-3 week period  R groin hemorrhage at puncture site Care as per Vascular Surgery - appears to be improving nicely  Acute blood loss anemia The patient's hemoglobin is holding steady/increasing - she is status post 2 units packed for blood cells during the course of his  hospital stay thus far - there is no evidence of ongoing blood loss at this time  Hypomagnesemia Replace and follow - goal is 4.0  Chronic afib In sinus rhythm at present - Cardiology is transitioning her from IV Cardizem to oral Cardizem - if heart rate remains controlled overnight should be able to transfer to a telemetry bed  HTN Blood pressure well controlled at the present time  Hypothyroidism Continue home Synthroid dose  SSS s/p pacer   Chronic constipation Resume usual home bowel regimen  Code Status: FULL Family Communication: Spoke with daughter in law at bedside Disposition Plan: SDU - transfer to telemetry bed in a.m. if heart rate controlled  Consultants: Vascular surgery ENT  Antibiotics: Cefazolin 6/5 >  DVT prophylaxis: SCDs  Objective: Blood pressure 128/84, pulse 111, temperature 98 F (36.7 C), temperature source Oral, resp. rate 22, height 5\' 1"  (1.549 m), weight 61.4 kg (135 lb 5.8 oz), SpO2 96 %.  Intake/Output Summary (Last 24 hours) at 07/17/14 1100 Last data filed at 07/17/14 0800  Gross per 24 hour  Intake 1460.84 ml  Output   4175 ml  Net -2714.16 ml   Exam: General: No acute respiratory distress HEENT: Nasal packing in place and left snare with no evidence of bleeding Lungs: Clear to auscultation bilaterally - no wheezes or crackles Cardiovascular: Regular rate and rhythm without murmur gallop or rub  Abdomen: Nontender, nondistended, soft, bowel sounds positive, no rebound, no ascites, no appreciable mass Extremities: No significant cyanosis, clubbing, or edema bilateral lower extremities - right thigh/groin  wound dressed and dry  Data Reviewed: Basic Metabolic Panel:  Recent Labs Lab 07/14/14 0859 07/14/14 2034 07/15/14 0150 07/16/14 0225 07/16/14 1912  NA 139  --  142 137 137  K 3.8  --  3.9 3.4* 3.1*  CL 102  --  110 104 103  CO2 28  --  26 26 27   GLUCOSE 100*  --  139* 116* 117*  BUN 15  --  9 7 5*  CREATININE 0.74   --  0.70 0.64 0.59  CALCIUM 9.2  --  7.6* 7.8* 8.1*  MG  --  1.6*  --  1.8 1.9    CBC:  Recent Labs Lab 07/15/14 0150 07/15/14 1045 07/15/14 2006 07/16/14 0225 07/16/14 1912  WBC 8.9 11.6* 12.7* 11.6* 12.5*  NEUTROABS 7.0  --   --   --  8.7*  HGB 7.4* 6.4* 9.3* 8.8* 9.4*  HCT 22.2* 19.3* 27.0* 25.7* 27.9*  MCV 89.5 89.4 83.6 83.4 84.5  PLT 171 155 137* 144* 148*    Liver Function Tests:  Recent Labs Lab 07/16/14 0225 07/16/14 1912  AST 27 35  ALT 15 16  ALKPHOS 43 49  BILITOT 0.8 1.1  PROT 4.9* 5.6*  ALBUMIN 2.9* 3.0*    Coags:  Recent Labs Lab 07/12/14 2111 07/14/14 0540 07/14/14 2034 07/15/14 0150  INR 2.09* 1.73* 1.58* 1.30    Recent Labs Lab 07/15/14 0150  APTT 25    Studies:   Recent x-ray studies have been reviewed in detail by the Attending Physician  Scheduled Meds:  Scheduled Meds: . amiodarone  50 mg Oral Daily  .  ceFAZolin (ANCEF) IV  1 g Intravenous Q12H  . Glycerin (Adult)  1 suppository Rectal Once  . levothyroxine  50 mcg Oral QODAY   And  . [START ON 07/18/2014] levothyroxine  75 mcg Oral QODAY  . metoprolol succinate  50 mg Oral Daily  . polyvinyl alcohol  1 drop Both Eyes Daily  . silver sulfADIAZINE   Topical BID  . sodium chloride  3 mL Intravenous Q12H    Time spent on care of this patient: 35 mins   Darnice Comrie T , MD   [facility] Office  [phone] Pager - Text Page per Shea Evans as per below:  On-Call/Text Page:      [email or web address]      password [email or web address]  If 7PM-7AM, please contact night-coverage [email or web address] Password [email or web address] 07/17/2014, 11:00 AM   LOS: 3 days

## 2014-07-17 NOTE — Progress Notes (Addendum)
  Progress Note    07/17/2014 7:44 AM 2 Days Post-Op  Subjective:  C/o soreness and swelling in right thigh  Tm 99.5 now afebrile  Filed Vitals:   07/17/14 0529  BP: 103/60  Pulse: 110  Temp:   Resp: 18    Physical Exam: Cardiac:  irregular Lungs:  Non labored Incisions:  Clean, dry & intact Extremities:  2+ right palpable DP pulse; skin breakdown over right medial thigh.   CBC    Component Value Date/Time   WBC 12.5* 07/16/2014 1912   RBC 3.30* 07/16/2014 1912   HGB 9.4* 07/16/2014 1912   HCT 27.9* 07/16/2014 1912   PLT 148* 07/16/2014 1912   MCV 84.5 07/16/2014 1912   MCH 28.5 07/16/2014 1912   MCHC 33.7 07/16/2014 1912   RDW 17.6* 07/16/2014 1912   LYMPHSABS 1.5 07/16/2014 1912   MONOABS 2.3* 07/16/2014 1912   EOSABS 0.0 07/16/2014 1912   BASOSABS 0.0 07/16/2014 1912    BMET    Component Value Date/Time   NA 137 07/16/2014 1912   K 3.1* 07/16/2014 1912   CL 103 07/16/2014 1912   CO2 27 07/16/2014 1912   GLUCOSE 117* 07/16/2014 1912   BUN 5* 07/16/2014 1912   CREATININE 0.59 07/16/2014 1912   CALCIUM 8.1* 07/16/2014 1912   GFRNONAA >60 07/16/2014 1912   GFRAA >60 07/16/2014 1912    INR    Component Value Date/Time   INR 1.30 07/15/2014 0150   INR 2.5 07/03/2014 1316     Intake/Output Summary (Last 24 hours) at 07/17/14 0744 Last data filed at 07/17/14 0616  Gross per 24 hour  Intake 1690.84 ml  Output   3750 ml  Net -2059.16 ml   JP drain output:  50cc/24hr (up from previous day)  Assessment:  81 y.o. female is s/p:  Evacuation of large hematoma right medial thigh and placement of a 15 Blake drain  2 Days Post-Op  Plan: -pt doing well this morning-incision is clean and in tact. -2+ right palpable DP pulse -c/o soreness and swelling over right groin this am-right thigh is soft -JP drain with 50cc/24hr.  Will d/w Dr. Donnetta Hutching when to dc -still with skin breakdown over the skin of medial thigh -there is order to remove foley, but given  where skin breakdown is, would favor leaving foley in one more day -DVT prophylaxis:  None at this time for high risk of bleeding-resume per Cardiology (3-4 weeks) -Afib-rate control-cardiology following -change dressing after Dr. Donnetta Hutching has seen pt. -will check CBC tomorrow   Leontine Locket, PA-C [facility] [phone] 07/17/2014 7:44 AM    I have examined the patient, reviewed and agree with above. Looks good today. Spoke with The patient and her stepdaughter present. Skin actually looks much better in her right medial thigh. Minimal output JP drain will discontinue this. No lower extremity ischemia. Mobilize when necessary.  Curt Jews, MD 07/17/2014 10:17 AM

## 2014-07-17 NOTE — Progress Notes (Signed)
Patient ID: Kathryn Gibson, female   DOB: 31-Oct-1933, 81 y.o.   MRN: [id number]    Subjective:  Denies SSCP, palpitations or Dyspnea Feels much better  Dressings/JP drain left leg   Objective:  Filed Vitals:   07/17/14 0400 07/17/14 0529 07/17/14 0700 07/17/14 0756  BP: 96/53 103/60  128/84  Pulse: 110 110  111  Temp: 98.7 F (37.1 C)  98 F (36.7 C)   TempSrc: Oral  Oral   Resp: 20 18  22   Height:      Weight:      SpO2: 95% 96%  96%    Intake/Output from previous day:  Intake/Output Summary (Last 24 hours) at 07/17/14 1131 Last data filed at 07/17/14 0800  Gross per 24 hour  Intake 1460.84 ml  Output   4175 ml  Net -2714.16 ml    Physical Exam: Affect appropriate Elderly white female  HEENT: normal Neck supple with no adenopathy JVP normal no bruits no thyromegaly Lungs clear with no wheezing and good diaphragmatic motion Heart:  S1/S2 no murmur, no rub, gallop or click PMI normal Abdomen: benighn, BS positve, no tenderness, no AAA Post left groin hematoma evacuation with JP drain in place Distal pulses intact with no bruits No edema Neuro non-focal Skin warm and dry No muscular weakness   Lab Results: Basic Metabolic Panel:  Recent Labs  07/16/14 0225 07/16/14 1912  NA 137 137  K 3.4* 3.1*  CL 104 103  CO2 26 27  GLUCOSE 116* 117*  BUN 7 5*  CREATININE 0.64 0.59  CALCIUM 7.8* 8.1*  MG 1.8 1.9   Liver Function Tests:  Recent Labs  07/16/14 0225 07/16/14 1912  AST 27 35  ALT 15 16  ALKPHOS 43 49  BILITOT 0.8 1.1  PROT 4.9* 5.6*  ALBUMIN 2.9* 3.0*   CBC:  Recent Labs  07/15/14 0150  07/16/14 0225 07/16/14 1912  WBC 8.9  < > 11.6* 12.5*  NEUTROABS 7.0  --   --  8.7*  HGB 7.4*  < > 8.8* 9.4*  HCT 22.2*  < > 25.7* 27.9*  MCV 89.5  < > 83.4 84.5  PLT 171  < > 144* 148*  < > = values in this interval not displayed.   Imaging: Ct Pelvis Wo Contrast  07/15/2014   CLINICAL DATA:  Status post arteriographic procedures yesterday  with embolization for treatment of uncontrolled epistaxis. There is drop in hemoglobin with clinical right groin/thigh hematoma.  EXAM: CT PELVIS WITHOUT CONTRAST  TECHNIQUE: Multidetector CT imaging of the pelvis was performed following the standard protocol without intravenous contrast.  COMPARISON:  None.  FINDINGS: There is a large focal hematoma of the right groin beginning below the inguinal ligament and extending into the medial proximal thigh. On axial images, maximal hematoma dimensions are approximately 5.3 x 11.5 cm. There is adjacent stranding in the fat. This hematoma appears to lie outside of the muscular compartments. There is no evidence of retroperitoneal or peritoneal hemorrhage in the pelvis. The bladder contains a Foley catheter. No incidental masses. Bony structures are within normal limits.  IMPRESSION: Focal hematoma of the right groin extending into the medial proximal thigh. Maximal dimensions are approximately 12 cm on axial images. No intraperitoneal or retroperitoneal hemorrhage within the pelvis.   Electronically Signed   By: Aletta Edouard M.D.   On: 07/15/2014 13:16    Cardiac Studies:  ECG:  afib nonspecific ST changes   Telemetry:  afib rate 115  Echo:  Medications:   . amiodarone  50 mg Oral Daily  .  ceFAZolin (ANCEF) IV  1 g Intravenous Q12H  . Glycerin (Adult)  1 suppository Rectal Once  . levothyroxine  50 mcg Oral QODAY   And  . [START ON 07/18/2014] levothyroxine  75 mcg Oral QODAY  . metoprolol succinate  50 mg Oral Daily  . polyvinyl alcohol  1 drop Both Eyes Daily  . silver sulfADIAZINE   Topical BID  . sodium chloride  3 mL Intravenous Q12H     . sodium chloride 60 mL/hr at 07/17/14 0800  . diltiazem (CARDIZEM) infusion 15 mg/hr (07/17/14 0513)    Assessment/Plan:  Afib:  No anticoagulation due to epistaxis and hematoma evacuation.  Continue toprol and change to po cardizem Epistaxis: resolved post packing, ligation and embolization of  maxillary and orbital artery Hematoma: per Dr Donnetta Hutching JP drain out Hct stable   Jenkins Rouge 07/17/2014, 11:31 AM

## 2014-07-17 NOTE — Progress Notes (Signed)
Subjective: POD# 3 and 2 from left nasal endoscopy with cautery. Left nasal cavity hemostatic completely filled with absorbable packing.  Objective: Vital signs in last 24 hours: Temp:  [98.5 F (36.9 C)-99.3 F (37.4 C)] 98.7 F (37.1 C) (06/08 0400) Pulse Rate:  [72-153] 110 (06/08 0529) Resp:  [17-29] 18 (06/08 0529) BP: (96-147)/(43-100) 103/60 mmHg (06/08 0529) SpO2:  [91 %-99 %] 96 % (06/08 0529) Weight:  [61.4 kg (135 lb 5.8 oz)] 61.4 kg (135 lb 5.8 oz) (06/07 2040)  EOMI, PERLLA, stable resolving left periorbital ecchymosis, right nasal cavity patent and hemostatic, left nasal cavity completely packed with absorbable packing, I removed her left nasal telfa, the surgiflo has set up nicely and is dry with no sanguinous staining.  @LABLAST2 (wbc:2,hgb:2,hct:2,plt:2)  Recent Labs  07/16/14 0225 07/16/14 1912  NA 137 137  K 3.4* 3.1*  CL 104 103  CO2 26 27  GLUCOSE 116* 117*  BUN 7 5*  CREATININE 0.64 0.59  CALCIUM 7.8* 8.1*    Medications:  Scheduled Meds: . amiodarone  50 mg Oral Daily  .  ceFAZolin (ANCEF) IV  1 g Intravenous Q12H  . Glycerin (Adult)  1 suppository Rectal Once  . levothyroxine  50 mcg Oral QODAY   And  . [START ON 07/18/2014] levothyroxine  75 mcg Oral QODAY  . metoprolol succinate  50 mg Oral Daily  . polyvinyl alcohol  1 drop Both Eyes Daily  . silver sulfADIAZINE   Topical BID  . sodium chloride  3 mL Intravenous Q12H   Continuous Infusions: . sodium chloride 60 mL/hr at 07/16/14 2215  . diltiazem (CARDIZEM) infusion 15 mg/hr (07/17/14 0513)   PRN Meds:.acetaminophen **OR** acetaminophen, LORazepam, metoprolol, morphine injection, ondansetron (ZOFRAN) IV  Assessment/Plan: Improved s/p left nasal endoscopy with cautery x 2. Left nasal cavity is hemostatic. Left nasal packing is all absorbable and will dissolve on it's own over the next 2-3 weeks. No nose blowing and patient should not try to manipulate or remove the left nasal packing. Follow  up as needed.   LOS: 3 days   Ruby Cola 07/17/2014, 7:26 AM

## 2014-07-17 NOTE — Progress Notes (Signed)
Subjective: Patient denies any further nasal bleeding. She states her right thigh drain has been removed today and pain improving.  Allergies: Review of patient's allergies indicates no known allergies.  Medications: Prior to Admission medications   Medication Sig Start Date End Date Taking? Authorizing Provider  acetaminophen (TYLENOL) 500 MG tablet Take 500-1,000 mg by mouth every 6 (six) hours as needed for moderate pain or headache.   Yes Historical Provider, MD  amiodarone (PACERONE) 100 MG tablet Take 50 mg by mouth daily.   Yes Historical Provider, MD  Ascorbic Acid (VITAMIN C) 1000 MG tablet Take 1,000 mg by mouth daily.     Yes Historical Provider, MD  aspirin 81 MG tablet Take 81 mg by mouth daily.     Yes Historical Provider, MD  Calcium-Vitamin D (CALTRATE 600 PLUS-VIT D PO) Take 1 tablet by mouth daily.   Yes Historical Provider, MD  cholecalciferol (VITAMIN D) 1000 UNITS tablet Take 1,000 Units by mouth daily.     Yes Historical Provider, MD  levothyroxine (SYNTHROID, LEVOTHROID) 50 MCG tablet Take 41mcg by mouth every other day and take 75 mcg by mouth on alternate days   Yes Historical Provider, MD  LYSINE PO Take 1-2 tablets by mouth daily as needed (fever blister).   Yes Historical Provider, MD  metoprolol succinate (TOPROL-XL) 50 MG 24 hr tablet Take 1 tablet (50 mg total) by mouth daily. 04/16/14  Yes Darlin Coco, MD  Multiple Vitamin (MULTIVITAMIN) capsule Take 1 capsule by mouth daily.     Yes Historical Provider, MD  Omega-3 Fatty Acids (FISH OIL) 1200 MG CAPS Take 1 capsule by mouth daily. Mega Red.   Yes Historical Provider, MD  Polyethyl Glycol-Propyl Glycol (SYSTANE OP) Apply 1-2 drops to eye daily.   Yes Historical Provider, MD  polyethylene glycol powder (MIRALAX) powder Take two capfuls by mouth at bedtime   Yes Historical Provider, MD  Probiotic Product (ALIGN PO) Take 1 capsule by mouth daily.    Yes Historical Provider, MD  temazepam (RESTORIL) 30 MG  capsule Take 30 mg by mouth at bedtime.    Yes Historical Provider, MD  verapamil (CALAN-SR) 240 MG CR tablet TAKE 1 BY MOUTH AT BEDTIME 05/27/14  Yes Darlin Coco, MD  warfarin (COUMADIN) 5 MG tablet Take as directed by coumadin clinic 07/15/14   Darlin Coco, MD   Vital Signs: BP 128/84 mmHg  Pulse 111  Temp(Src) 98.5 F (36.9 C) (Oral)  Resp 22  Ht 5\' 1"  (1.549 m)  Wt 135 lb 5.8 oz (61.4 kg)  BMI 25.59 kg/m2  SpO2 96%  Physical Exam General: A&Ox3, NAD, sitting up in chair Periorbital ecchymosis, packing intact Ext: Right groin dressing intact, soft, JP drain out, DP 2+ b/l  Imaging: Ct Pelvis Wo Contrast  07/15/2014   CLINICAL DATA:  Status post arteriographic procedures yesterday with embolization for treatment of uncontrolled epistaxis. There is drop in hemoglobin with clinical right groin/thigh hematoma.  EXAM: CT PELVIS WITHOUT CONTRAST  TECHNIQUE: Multidetector CT imaging of the pelvis was performed following the standard protocol without intravenous contrast.  COMPARISON:  None.  FINDINGS: There is a large focal hematoma of the right groin beginning below the inguinal ligament and extending into the medial proximal thigh. On axial images, maximal hematoma dimensions are approximately 5.3 x 11.5 cm. There is adjacent stranding in the fat. This hematoma appears to lie outside of the muscular compartments. There is no evidence of retroperitoneal or peritoneal hemorrhage in the pelvis. The bladder contains  a Foley catheter. No incidental masses. Bony structures are within normal limits.  IMPRESSION: Focal hematoma of the right groin extending into the medial proximal thigh. Maximal dimensions are approximately 12 cm on axial images. No intraperitoneal or retroperitoneal hemorrhage within the pelvis.   Electronically Signed   By: Aletta Edouard M.D.   On: 07/15/2014 13:16   Ir Transcath/emboliz  07/16/2014   CLINICAL DATA:  Recurrent severe epistaxis.  EXAM: IR ANGIO EXTERNAL  CAROTID SEL EXT CAROTID BILAT MOD SED BILATERAL COMMON CAROTID ARTERIOGRAMS, BILATERAL EXTERNAL CAROTID ARTERIOGRAMS FOLLOWED BY SUPER SELECTIVE ENDOVASCULAR EMBOLIZATION OF INTERNAL MAXILLARY ARTERIES BILATERALLY, INFRAORBITAL ARTERIES BILATERALLY, AND OF THE RIGHT FACIAL ARTERY NASAL BRANCHES USING PVA PARTICLES.  ANESTHESIA/SEDATION: General anesthesia.  MEDICATIONS: As per general anesthesia.  CONTRAST:  60 mL OMNIPAQUE IOHEXOL 300 MG/ML  SOLN  PROCEDURE: Following a full explanation of the procedure along with the potential associated complications, an informed witnessed consent was obtained.  The patient was put under general anesthesia by the Department of Anesthesiology at [facility].  The right groin was prepped and draped in the usual sterile fashion. Thereafter using modified Seldinger technique, transfemoral access into the right common femoral artery was obtained without difficulty. Over a 0.035 inch guidewire, a 5 French Pinnacle sheath was inserted. Through this, and also over a 0.035 inch guidewire a 5 French JB1 catheter was advanced to the aortic arch region and selectively positioned in the left common carotid artery, the left external carotid artery, the right common carotid artery and the right external carotid artery.  COMPLICATIONS: None immediate.  FINDINGS: The left common carotid arteriogram demonstrates the left external carotid artery at its origin and its major branches proximally normal.  The left internal carotid artery at the bulb to the cranial skull base appears widely patent.  The left internal carotid artery at its proximal two-thirds is widely patent.  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 No evidence of significant narrowing noted.  More distally the petrous, the cavernous and the supraclinoid segments are widely patent.  The left middle and the left anterior cerebral arteries opacify normally into the  capillary and venous phases. The right common carotid arteriogram demonstrates the right internal carotid artery at the bulb and its proximal one-third to be normal.  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  No associated significant narrowing is noted.  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  Distal to this the vessel is seen to opacify to the cranial skull base.  The petrous, the cavernous and the supraclinoid segments are widely patent.  The right middle and the right anterior cerebral arteries opacify normally into the capillary and venous phases.  ENDOVASCULAR SUPER SELECTIVE EMBOLIZATION OF THE EXTERNAL CAROTID ARTERY BRANCHES BILATERALLY USING PVA PARTICLES SIZES 250-350 MICRONS, 350-500 MICRONS, AND 500-700 MICRONS.  The diagnostic JB1 catheter in the left common carotid artery was then exchanged over a 0.035 inch 300 cm Rosen exchange guidewire for a 6 French 65 cm neurovascular sheath using biplane roadmap technique and constant fluoroscopic guidance. Good aspiration was obtained from the side port of the 6 [place] Arrow neurovascular sheath. A gentle contrast injection demonstrated no evidence of spasm, dissections or of intraluminal filling defects.  This was then connected to continuous heparinized saline infusion.  Over  the [employer] guidewire and using biplane roadmap technique and constant fluoroscopic guidance, a 6 French 90 cm Brite tip Envoy guide catheter was then advanced and positioned in the proximal left external carotid artery. The guidewire was removed. Good aspiration was obtained from the hub of the 6 [place] Envoy guide catheter.  A control arteriogram performed through the left external carotid artery demonstrated abnormal prominence of the internal maxillary artery branches and  of the sphenopalatine branch.  Using biplane roadmap technique and constant fluoroscopic guidance, in a coaxial manner and with constant heparinized saline infusion, a rapid transit micro catheter was then advanced over a 0.014 inch Softip Synchro micro guidewire to the distal end of the 6 [place] guide catheter.  With the micro guidewire leading with a J-tip configuration and torque device, the combination was then navigated without difficulty to the junction of the infraorbital and the sphenopalatine branches in the internal maxillary vessel.  After having confirmed absence of dangerous anastomosis, embolization was then performed using PVA particles of 250-350 microns, 350-500 microns and 500-700 microns mixture mixed with 75% contrast and 25% heparinized saline infusion.  The embolizations were performed under intermittent fluoroscopic guidance until there was stasis and/or left flex at the tip of the micro catheter as the micro catheter was retrieved more proximally.  At the end of this, a control arteriogram performed through the 6 [place] guide catheter in the left external carotid artery demonstrated complete occlusion of the embolized vessels.  Attempts were then made to gain IV access into the significantly severely tortuous left facial artery proximally. Access was obtained with the micro guidewire and micro catheter.  However, there was severe spasm and occlusion.  This vessel was, therefore, not embolized.  A control arteriogram performed after removal of the micro catheter and the micro guidewire, with the 6 [place] guide catheter in the left common carotid artery demonstrated no evidence of abnormal intracranial filling defects.  In a similar fashion as described above, access was obtained with the 6 French Brite tip Envoy guide catheter in the proximal right external carotid artery. Prior to this, a control arteriogram performed centered intracranially demonstrated no evidence of abnormal filling  defects or occlusions or stenosis.  Again using biplane roadmap technique and constant fluoroscopic guidance, in a coaxial manner and with constant heparinized saline infusion, the combination of the rapid transit 2 tip micro catheter over a 0.014 inch Softip Transend EX micro guidewire was then advanced into the distal right internal maxillary artery just proximal to the infraorbital and the sphenopalatine arteries.  Again after having confirmed absence of dangerous anastomosis, embolization was then performed utilizing PVA particles sizes 250-350 microns, 350-500 microns, and 500-700 microns in a mixture of 75% contrast and 25% heparinized saline infusion.  Following achievement of stasis following complete embolization of these vessels, access was obtained into the right facial artery using biplane roadmap technique and constant fluoroscopic guidance. Again after having confirmed the absence of dangerous anastomosis, embolization was carried out with PVA particles of sizes as described above of the nasal branches of the facial artery.  Once stasis had been achieved, the micro catheter was then gently retrieved and removed. A control arteriogram performed through the 6 [place] Envoy guide catheter in the right external carotid artery demonstrated complete stasis of the embolized vessels.  No other abnormal vessels were noted.  A control arteriogram performed through the 6 [place] guide catheter in the right common carotid artery demonstrated no evidence of intracranial filling defects or of occlusions.  Throughout the procedure, the patient's hemodynamic status and neurological status remained stable.  The patient's ACT was maintained in the region of approximately 160-180 seconds.  The 6 [place] guide catheter and the 6 French neurovascular sheath were then retrieved into the abdominal aorta and exchanged over a J-tip guidewire for a 6 [place] Pinnacle sheath. This in turn was then successfully replaced with an  external closure device. The patient was given 7.5 mg of protamine sulfate to reverse the effects of IV heparin.  IMPRESSION: Status post super selective endovascular embolization of the left sphenopalatine, the infraorbital and the internal maxillary artery on the left, and of the right sphenopalatine, the infraorbital, the internal maxillary branches, and of the of nasal branches of the right facial artery using PVA particles as described above.  A rounded prominence of contrast is to the left of the midline in the left floor of the left orbit, being supplied by the left ophthalmic artery. The significance of this is unclear at this time. This may represent a pseudo aneurysm or a vascular abnormality. Further evaluation of this may be needed with either an orbital MRI MRA exam or a CT of the orbits with contrast.   Electronically Signed   By: Luanne Bras M.D.   On: 07/15/2014 10:03   Ir Angiogram Follow Up Study  07/16/2014   CLINICAL DATA:  Recurrent severe epistaxis.  EXAM: IR ANGIO EXTERNAL CAROTID SEL EXT CAROTID BILAT MOD SED BILATERAL COMMON CAROTID ARTERIOGRAMS, BILATERAL EXTERNAL CAROTID ARTERIOGRAMS FOLLOWED BY SUPER SELECTIVE ENDOVASCULAR EMBOLIZATION OF INTERNAL MAXILLARY ARTERIES BILATERALLY, INFRAORBITAL ARTERIES BILATERALLY, AND OF THE RIGHT FACIAL ARTERY NASAL BRANCHES USING PVA PARTICLES.  ANESTHESIA/SEDATION: General anesthesia.  MEDICATIONS: As per general anesthesia.  CONTRAST:  60 mL OMNIPAQUE IOHEXOL 300 MG/ML  SOLN  PROCEDURE: Following a full explanation of the procedure along with the potential associated complications, an informed witnessed consent was obtained.  The patient was put under general anesthesia by the Department of Anesthesiology at [facility].  The right groin was prepped and draped in the usual sterile fashion. Thereafter using modified Seldinger technique, transfemoral access into the right common femoral artery was obtained without difficulty. Over a  0.035 inch guidewire, a 5 French Pinnacle sheath was inserted. Through this, and also over a 0.035 inch guidewire a 5 French JB1 catheter was advanced to the aortic arch region and selectively positioned in the left common carotid artery, the left external carotid artery, the right common carotid artery and the right external carotid artery.  COMPLICATIONS: None immediate.  FINDINGS: The left common carotid arteriogram demonstrates the left external carotid artery at its origin and its major branches proximally normal.  The left internal carotid artery at the bulb to the cranial skull base appears widely patent.  The left internal carotid artery at its proximal two-thirds is widely patent.  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 No evidence of significant narrowing noted.  More distally the petrous, the cavernous and the supraclinoid segments are widely patent.  The left middle and the left anterior cerebral arteries opacify normally into the capillary and venous phases. The right common carotid arteriogram demonstrates the right internal carotid artery at the bulb and its proximal one-third to be normal.  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  No associated significant narrowing is noted.  Also demonstrated is  a broad-based outpouching of the distal right cervical ICA indicative of a probable aneurysm.  Distal to this the vessel is seen to opacify to the cranial skull base.  The petrous, the cavernous and the supraclinoid segments are widely patent.  The right middle and the right anterior cerebral arteries opacify normally into the capillary and venous phases.  ENDOVASCULAR SUPER SELECTIVE EMBOLIZATION OF THE EXTERNAL CAROTID ARTERY BRANCHES BILATERALLY USING PVA PARTICLES SIZES 250-350 MICRONS, 350-500 MICRONS, AND 500-700  MICRONS.  The diagnostic JB1 catheter in the left common carotid artery was then exchanged over a 0.035 inch 300 cm Rosen exchange guidewire for a 6 French 65 cm neurovascular sheath using biplane roadmap technique and constant fluoroscopic guidance. Good aspiration was obtained from the side port of the 6 [place] Arrow neurovascular sheath. A gentle contrast injection demonstrated no evidence of spasm, dissections or of intraluminal filling defects.  This was then connected to continuous heparinized saline infusion.  Over the [facility] exchange guidewire and using biplane roadmap technique and constant fluoroscopic guidance, a 6 French 90 cm Brite tip Envoy guide catheter was then advanced and positioned in the proximal left external carotid artery. The guidewire was removed. Good aspiration was obtained from the hub of the 6 [place] Envoy guide catheter.  A control arteriogram performed through the left external carotid artery demonstrated abnormal prominence of the internal maxillary artery branches and of the sphenopalatine branch.  Using biplane roadmap technique and constant fluoroscopic guidance, in a coaxial manner and with constant heparinized saline infusion, a rapid transit micro catheter was then advanced over a 0.014 inch Softip Synchro micro guidewire to the distal end of the 6 [place] guide catheter.  With the micro guidewire leading with a J-tip configuration and torque device, the combination was then navigated without difficulty to the junction of the infraorbital and the sphenopalatine branches in the internal maxillary vessel.  After having confirmed absence of dangerous anastomosis, embolization was then performed using PVA particles of 250-350 microns, 350-500 microns and 500-700 microns mixture mixed with 75% contrast and 25% heparinized saline infusion.  The embolizations were performed under intermittent fluoroscopic guidance until there was stasis and/or left flex at the tip of the micro catheter  as the micro catheter was retrieved more proximally.  At the end of this, a control arteriogram performed through the 6 [place] guide catheter in the left external carotid artery demonstrated complete occlusion of the embolized vessels.  Attempts were then made to gain IV access into the significantly severely tortuous left facial artery proximally. Access was obtained with the micro guidewire and micro catheter.  However, there was severe spasm and occlusion.  This vessel was, therefore, not embolized.  A control arteriogram performed after removal of the micro catheter and the micro guidewire, with the 6 [place] guide catheter in the left common carotid artery demonstrated no evidence of abnormal intracranial filling defects.  In a similar fashion as described above, access was obtained with the 6 French Brite tip Envoy guide catheter in the proximal right external carotid artery. Prior to this, a control arteriogram performed centered intracranially demonstrated no evidence of abnormal filling defects or occlusions or stenosis.  Again using biplane roadmap technique and constant fluoroscopic guidance, in a coaxial manner and with constant heparinized saline infusion, the combination of the rapid transit 2 tip micro catheter over a 0.014 inch Softip Transend EX micro guidewire was then advanced into the distal right internal maxillary artery just proximal to the infraorbital and the sphenopalatine arteries.  Again after having confirmed absence of dangerous anastomosis, embolization was then performed utilizing PVA particles sizes 250-350 microns, 350-500 microns, and 500-700 microns in a mixture of 75% contrast and 25% heparinized saline infusion.  Following achievement of stasis following complete embolization of these vessels, access was obtained into the right facial artery using biplane roadmap technique and constant fluoroscopic guidance. Again after having confirmed the absence of dangerous anastomosis,  embolization was carried out with PVA particles of sizes as described above of the nasal branches of the facial artery.  Once stasis had been achieved, the micro catheter was then gently retrieved and removed. A control arteriogram performed through the 6 [place] Envoy guide catheter in the right external carotid artery demonstrated complete stasis of the embolized vessels.  No other abnormal vessels were noted.  A control arteriogram performed through the 6 [place] guide catheter in the right common carotid artery demonstrated no evidence of intracranial filling defects or of occlusions.  Throughout the procedure, the patient's hemodynamic status and neurological status remained stable.  The patient's ACT was maintained in the region of approximately 160-180 seconds.  The 6 [place] guide catheter and the 6 French neurovascular sheath were then retrieved into the abdominal aorta and exchanged over a J-tip guidewire for a 6 [place] Pinnacle sheath. This in turn was then successfully replaced with an external closure device. The patient was given 7.5 mg of protamine sulfate to reverse the effects of IV heparin.  IMPRESSION: Status post super selective endovascular embolization of the left sphenopalatine, the infraorbital and the internal maxillary artery on the left, and of the right sphenopalatine, the infraorbital, the internal maxillary branches, and of the of nasal branches of the right facial artery using PVA particles as described above.  A rounded prominence of contrast is to the left of the midline in the left floor of the left orbit, being supplied by the left ophthalmic artery. The significance of this is unclear at this time. This may represent a pseudo aneurysm or a vascular abnormality. Further evaluation of this may be needed with either an orbital MRI MRA exam or a CT of the orbits with contrast.   Electronically Signed   By: Luanne Bras M.D.   On: 07/15/2014 10:03   Ir Angio Intra Extracran Sel  Com Carotid Innominate Bilat Mod Sed  07/16/2014   CLINICAL DATA:  Recurrent severe epistaxis.  EXAM: IR ANGIO EXTERNAL CAROTID SEL EXT CAROTID BILAT MOD SED BILATERAL COMMON CAROTID ARTERIOGRAMS, BILATERAL EXTERNAL CAROTID ARTERIOGRAMS FOLLOWED BY SUPER SELECTIVE ENDOVASCULAR EMBOLIZATION OF INTERNAL MAXILLARY ARTERIES BILATERALLY, INFRAORBITAL ARTERIES BILATERALLY, AND OF THE RIGHT FACIAL ARTERY NASAL BRANCHES USING PVA PARTICLES.  ANESTHESIA/SEDATION: General anesthesia.  MEDICATIONS: As per general anesthesia.  CONTRAST:  60 mL OMNIPAQUE IOHEXOL 300 MG/ML  SOLN  PROCEDURE: Following a full explanation of the procedure along with the potential associated complications, an informed witnessed consent was obtained.  The patient was put under general anesthesia by the Department of Anesthesiology at [facility].  The right groin was prepped and draped in the usual sterile fashion. Thereafter using modified Seldinger technique, transfemoral access into the right common femoral artery was obtained without difficulty. Over a 0.035 inch guidewire, a 5 French Pinnacle sheath was inserted. Through this, and also over a 0.035 inch guidewire a 5 French JB1 catheter was advanced to the aortic arch region and selectively positioned in the left common carotid artery, the left external carotid artery, the right common carotid artery and the right external carotid artery.  COMPLICATIONS: None immediate.  FINDINGS: The left common carotid arteriogram demonstrates the left external carotid artery at its origin and its major branches proximally normal.  The left internal carotid artery at the bulb to the cranial skull base appears widely patent.  The left internal carotid artery at its proximal two-thirds is widely patent.  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 No evidence of significant narrowing noted.  More distally the petrous, the cavernous and the  supraclinoid segments are widely patent.  The left middle and the left anterior cerebral arteries opacify normally into the capillary and venous phases. The right common carotid arteriogram demonstrates the right internal carotid artery at the bulb and its proximal one-third to be normal.  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  No associated significant narrowing is noted.  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  Distal to this the vessel is seen to opacify to the cranial skull base.  The petrous, the cavernous and the supraclinoid segments are widely patent.  The right middle and the right anterior cerebral arteries opacify normally into the capillary and venous phases.  ENDOVASCULAR SUPER SELECTIVE EMBOLIZATION OF THE EXTERNAL CAROTID ARTERY BRANCHES BILATERALLY USING PVA PARTICLES SIZES 250-350 MICRONS, 350-500 MICRONS, AND 500-700 MICRONS.  The diagnostic JB1 catheter in the left common carotid artery was then exchanged over a 0.035 inch 300 cm Rosen exchange guidewire for a 6 French 65 cm neurovascular sheath using biplane roadmap technique and constant fluoroscopic guidance. Good aspiration was obtained from the side port of the 6 [place] Arrow neurovascular sheath. A gentle contrast injection demonstrated no evidence of spasm, dissections or of intraluminal filling defects.  This was then connected to continuous heparinized saline infusion.  Over the [facility] exchange guidewire and using biplane roadmap technique and constant fluoroscopic guidance, a 6 French 90 cm Brite tip Envoy guide catheter was then advanced and positioned in the proximal left external carotid artery. The guidewire was removed. Good aspiration was obtained from the hub of the 6 [place] Envoy guide catheter.  A control arteriogram  performed through the left external carotid artery demonstrated abnormal prominence of the internal maxillary artery branches and of the sphenopalatine branch.  Using biplane roadmap technique and constant fluoroscopic guidance, in a coaxial manner and with constant heparinized saline infusion, a rapid transit micro catheter was then advanced over a 0.014 inch Softip Synchro micro guidewire to the distal end of the 6 [place] guide catheter.  With the micro guidewire leading with a J-tip configuration and torque device, the combination was then navigated without difficulty to the junction of the infraorbital and the sphenopalatine branches in the internal maxillary vessel.  After having confirmed absence of dangerous anastomosis, embolization was then performed using PVA particles of 250-350 microns, 350-500 microns and 500-700 microns mixture mixed with 75% contrast and 25% heparinized saline infusion.  The embolizations were performed under intermittent fluoroscopic guidance until there was stasis and/or left flex at the tip of the micro catheter as the micro catheter was retrieved more proximally.  At the end of this, a control arteriogram performed through the 6 [place] guide catheter in the left external carotid artery demonstrated complete occlusion of the embolized vessels.  Attempts were then made to gain IV access into the significantly severely tortuous left  facial artery proximally. Access was obtained with the micro guidewire and micro catheter.  However, there was severe spasm and occlusion.  This vessel was, therefore, not embolized.  A control arteriogram performed after removal of the micro catheter and the micro guidewire, with the 6 [place] guide catheter in the left common carotid artery demonstrated no evidence of abnormal intracranial filling defects.  In a similar fashion as described above, access was obtained with the 6 [place] Brite tip Envoy guide catheter in the proximal right external carotid  artery. Prior to this, a control arteriogram performed centered intracranially demonstrated no evidence of abnormal filling defects or occlusions or stenosis.  Again using biplane roadmap technique and constant fluoroscopic guidance, in a coaxial manner and with constant heparinized saline infusion, the combination of the rapid transit 2 tip micro catheter over a 0.014 inch Softip Transend EX micro guidewire was then advanced into the distal right internal maxillary artery just proximal to the infraorbital and the sphenopalatine arteries.  Again after having confirmed absence of dangerous anastomosis, embolization was then performed utilizing PVA particles sizes 250-350 microns, 350-500 microns, and 500-700 microns in a mixture of 75% contrast and 25% heparinized saline infusion.  Following achievement of stasis following complete embolization of these vessels, access was obtained into the right facial artery using biplane roadmap technique and constant fluoroscopic guidance. Again after having confirmed the absence of dangerous anastomosis, embolization was carried out with PVA particles of sizes as described above of the nasal branches of the facial artery.  Once stasis had been achieved, the micro catheter was then gently retrieved and removed. A control arteriogram performed through the 6 [place] Envoy guide catheter in the right external carotid artery demonstrated complete stasis of the embolized vessels.  No other abnormal vessels were noted.  A control arteriogram performed through the 6 [place] guide catheter in the right common carotid artery demonstrated no evidence of intracranial filling defects or of occlusions.  Throughout the procedure, the patient's hemodynamic status and neurological status remained stable.  The patient's ACT was maintained in the region of approximately 160-180 seconds.  The 6 [place] guide catheter and the 6 French neurovascular sheath were then retrieved into the abdominal aorta  and exchanged over a J-tip guidewire for a 6 [place] Pinnacle sheath. This in turn was then successfully replaced with an external closure device. The patient was given 7.5 mg of protamine sulfate to reverse the effects of IV heparin.  IMPRESSION: Status post super selective endovascular embolization of the left sphenopalatine, the infraorbital and the internal maxillary artery on the left, and of the right sphenopalatine, the infraorbital, the internal maxillary branches, and of the of nasal branches of the right facial artery using PVA particles as described above.  A rounded prominence of contrast is to the left of the midline in the left floor of the left orbit, being supplied by the left ophthalmic artery. The significance of this is unclear at this time. This may represent a pseudo aneurysm or a vascular abnormality. Further evaluation of this may be needed with either an orbital MRI MRA exam or a CT of the orbits with contrast.   Electronically Signed   By: Luanne Bras M.D.   On: 07/15/2014 10:03   Ir Angio External Carotid Sel Ext Carotid Bilat Mod Sed  07/16/2014   CLINICAL DATA:  Recurrent severe epistaxis.  EXAM: IR ANGIO EXTERNAL CAROTID SEL EXT CAROTID BILAT MOD SED BILATERAL COMMON CAROTID ARTERIOGRAMS, BILATERAL EXTERNAL CAROTID ARTERIOGRAMS FOLLOWED BY SUPER SELECTIVE  ENDOVASCULAR EMBOLIZATION OF INTERNAL MAXILLARY ARTERIES BILATERALLY, INFRAORBITAL ARTERIES BILATERALLY, AND OF THE RIGHT FACIAL ARTERY NASAL BRANCHES USING PVA PARTICLES.  ANESTHESIA/SEDATION: General anesthesia.  MEDICATIONS: As per general anesthesia.  CONTRAST:  60 mL OMNIPAQUE IOHEXOL 300 MG/ML  SOLN  PROCEDURE: Following a full explanation of the procedure along with the potential associated complications, an informed witnessed consent was obtained.  The patient was put under general anesthesia by the Department of Anesthesiology at [facility].  The right groin was prepped and draped in the usual sterile fashion.  Thereafter using modified Seldinger technique, transfemoral access into the right common femoral artery was obtained without difficulty. Over a 0.035 inch guidewire, a 5 French Pinnacle sheath was inserted. Through this, and also over a 0.035 inch guidewire a 5 French JB1 catheter was advanced to the aortic arch region and selectively positioned in the left common carotid artery, the left external carotid artery, the right common carotid artery and the right external carotid artery.  COMPLICATIONS: None immediate.  FINDINGS: The left common carotid arteriogram demonstrates the left external carotid artery at its origin and its major branches proximally normal.  The left internal carotid artery at the bulb to the cranial skull base appears widely patent.  The left internal carotid artery at its proximal two-thirds is widely patent.  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 No evidence of significant narrowing noted.  More distally the petrous, the cavernous and the supraclinoid segments are widely patent.  The left middle and the left anterior cerebral arteries opacify normally into the capillary and venous phases. The right common carotid arteriogram demonstrates the right internal carotid artery at the bulb and its proximal one-third to be normal.  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  No associated significant narrowing is noted.  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  Distal to this the vessel is seen to opacify to the cranial skull base.  The petrous, the cavernous and the supraclinoid segments are widely patent.  The right middle and the right anterior cerebral arteries opacify normally into the capillary and venous phases.  ENDOVASCULAR SUPER  SELECTIVE EMBOLIZATION OF THE EXTERNAL CAROTID ARTERY BRANCHES BILATERALLY USING PVA PARTICLES SIZES 250-350 MICRONS, 350-500 MICRONS, AND 500-700 MICRONS.  The diagnostic JB1 catheter in the left common carotid artery was then exchanged over a 0.035 inch 300 cm Rosen exchange guidewire for a 6 French 65 cm neurovascular sheath using biplane roadmap technique and constant fluoroscopic guidance. Good aspiration was obtained from the side port of the 6 [place] Arrow neurovascular sheath. A gentle contrast injection demonstrated no evidence of spasm, dissections or of intraluminal filling defects.  This was then connected to continuous heparinized saline infusion.  Over the [facility] exchange guidewire and using biplane roadmap technique and constant fluoroscopic guidance, a 6 French 90 cm Brite tip Envoy guide catheter was then advanced and positioned in the proximal left external carotid artery. The guidewire was removed. Good aspiration was obtained from the hub of the 6 [place] Envoy guide catheter.  A control arteriogram performed through the left external carotid artery demonstrated abnormal prominence of the internal maxillary artery branches and of the sphenopalatine branch.  Using biplane roadmap technique and constant fluoroscopic guidance, in a coaxial manner and with constant heparinized saline infusion,  a rapid transit micro catheter was then advanced over a 0.014 inch Softip Synchro micro guidewire to the distal end of the 6 [place] guide catheter.  With the micro guidewire leading with a J-tip configuration and torque device, the combination was then navigated without difficulty to the junction of the infraorbital and the sphenopalatine branches in the internal maxillary vessel.  After having confirmed absence of dangerous anastomosis, embolization was then performed using PVA particles of 250-350 microns, 350-500 microns and 500-700 microns mixture mixed with 75% contrast and 25% heparinized saline infusion.   The embolizations were performed under intermittent fluoroscopic guidance until there was stasis and/or left flex at the tip of the micro catheter as the micro catheter was retrieved more proximally.  At the end of this, a control arteriogram performed through the 6 [place] guide catheter in the left external carotid artery demonstrated complete occlusion of the embolized vessels.  Attempts were then made to gain IV access into the significantly severely tortuous left facial artery proximally. Access was obtained with the micro guidewire and micro catheter.  However, there was severe spasm and occlusion.  This vessel was, therefore, not embolized.  A control arteriogram performed after removal of the micro catheter and the micro guidewire, with the 6 [place] guide catheter in the left common carotid artery demonstrated no evidence of abnormal intracranial filling defects.  In a similar fashion as described above, access was obtained with the 6 [place] Brite tip Envoy guide catheter in the proximal right external carotid artery. Prior to this, a control arteriogram performed centered intracranially demonstrated no evidence of abnormal filling defects or occlusions or stenosis.  Again using biplane roadmap technique and constant fluoroscopic guidance, in a coaxial manner and with constant heparinized saline infusion, the combination of the rapid transit 2 tip micro catheter over a 0.014 inch Softip Transend EX micro guidewire was then advanced into the distal right internal maxillary artery just proximal to the infraorbital and the sphenopalatine arteries.  Again after having confirmed absence of dangerous anastomosis, embolization was then performed utilizing PVA particles sizes 250-350 microns, 350-500 microns, and 500-700 microns in a mixture of 75% contrast and 25% heparinized saline infusion.  Following achievement of stasis following complete embolization of these vessels, access was obtained into the right facial  artery using biplane roadmap technique and constant fluoroscopic guidance. Again after having confirmed the absence of dangerous anastomosis, embolization was carried out with PVA particles of sizes as described above of the nasal branches of the facial artery.  Once stasis had been achieved, the micro catheter was then gently retrieved and removed. A control arteriogram performed through the 6 [place] Envoy guide catheter in the right external carotid artery demonstrated complete stasis of the embolized vessels.  No other abnormal vessels were noted.  A control arteriogram performed through the 6 [place] guide catheter in the right common carotid artery demonstrated no evidence of intracranial filling defects or of occlusions.  Throughout the procedure, the patient's hemodynamic status and neurological status remained stable.  The patient's ACT was maintained in the region of approximately 160-180 seconds.  The 6 [place] guide catheter and the 6 French neurovascular sheath were then retrieved into the abdominal aorta and exchanged over a J-tip guidewire for a 6 [place] Pinnacle sheath. This in turn was then successfully replaced with an external closure device. The patient was given 7.5 mg of protamine sulfate to reverse the effects of IV heparin.  IMPRESSION: Status post super selective endovascular embolization of the left sphenopalatine,  the infraorbital and the internal maxillary artery on the left, and of the right sphenopalatine, the infraorbital, the internal maxillary branches, and of the of nasal branches of the right facial artery using PVA particles as described above.  A rounded prominence of contrast is to the left of the midline in the left floor of the left orbit, being supplied by the left ophthalmic artery. The significance of this is unclear at this time. This may represent a pseudo aneurysm or a vascular abnormality. Further evaluation of this may be needed with either an orbital MRI MRA exam or a CT  of the orbits with contrast.   Electronically Signed   By: Luanne Bras M.D.   On: 07/15/2014 10:03   Ir Neuro Each Add'l After Basic Uni Left (ms)  07/16/2014   CLINICAL DATA:  Recurrent severe epistaxis.  EXAM: IR ANGIO EXTERNAL CAROTID SEL EXT CAROTID BILAT MOD SED BILATERAL COMMON CAROTID ARTERIOGRAMS, BILATERAL EXTERNAL CAROTID ARTERIOGRAMS FOLLOWED BY SUPER SELECTIVE ENDOVASCULAR EMBOLIZATION OF INTERNAL MAXILLARY ARTERIES BILATERALLY, INFRAORBITAL ARTERIES BILATERALLY, AND OF THE RIGHT FACIAL ARTERY NASAL BRANCHES USING PVA PARTICLES.  ANESTHESIA/SEDATION: General anesthesia.  MEDICATIONS: As per general anesthesia.  CONTRAST:  60 mL OMNIPAQUE IOHEXOL 300 MG/ML  SOLN  PROCEDURE: Following a full explanation of the procedure along with the potential associated complications, an informed witnessed consent was obtained.  The patient was put under general anesthesia by the Department of Anesthesiology at [facility].  The right groin was prepped and draped in the usual sterile fashion. Thereafter using modified Seldinger technique, transfemoral access into the right common femoral artery was obtained without difficulty. Over a 0.035 inch guidewire, a 5 French Pinnacle sheath was inserted. Through this, and also over a 0.035 inch guidewire a 5 French JB1 catheter was advanced to the aortic arch region and selectively positioned in the left common carotid artery, the left external carotid artery, the right common carotid artery and the right external carotid artery.  COMPLICATIONS: None immediate.  FINDINGS: The left common carotid arteriogram demonstrates the left external carotid artery at its origin and its major branches proximally normal.  The left internal carotid artery at the bulb to the cranial skull base appears widely patent.  The left internal carotid artery at its proximal two-thirds is widely patent.  Mild FMD-like changes are seen involving the distal one-third of the left internal  carotid artery. No evidence of significant narrowing noted.  More distally the petrous, the cavernous and the supraclinoid segments are widely patent.  The left middle and the left anterior cerebral arteries opacify normally into the capillary and venous phases. The right common carotid arteriogram demonstrates the right internal carotid artery at the bulb and its proximal one-third to be normal.  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  No associated significant narrowing is noted.  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  Distal to this the vessel is seen to opacify to the cranial skull base.  The petrous, the cavernous and the supraclinoid segments are widely patent.  The right middle and the right anterior cerebral arteries opacify normally into the capillary and venous phases.  ENDOVASCULAR SUPER SELECTIVE EMBOLIZATION OF THE EXTERNAL CAROTID ARTERY BRANCHES BILATERALLY USING PVA PARTICLES SIZES 250-350 MICRONS, 350-500 MICRONS, AND 500-700 MICRONS.  The diagnostic JB1 catheter in the left common carotid  artery was then exchanged over a 0.035 inch 300 cm [place] Holster exchange guidewire for a 6 French 65 cm neurovascular sheath using biplane roadmap technique and constant fluoroscopic guidance. Good aspiration was obtained from the side port of the 6 [place] Arrow neurovascular sheath. A gentle contrast injection demonstrated no evidence of spasm, dissections or of intraluminal filling defects.  This was then connected to continuous heparinized saline infusion.  Over the [facility] exchange guidewire and using biplane roadmap technique and constant fluoroscopic guidance, a 6 French 90 cm Brite tip Envoy guide catheter was then advanced and positioned in the proximal left external carotid artery. The guidewire was removed.  Good aspiration was obtained from the hub of the 6 [place] Envoy guide catheter.  A control arteriogram performed through the left external carotid artery demonstrated abnormal prominence of the internal maxillary artery branches and of the sphenopalatine branch.  Using biplane roadmap technique and constant fluoroscopic guidance, in a coaxial manner and with constant heparinized saline infusion, a rapid transit micro catheter was then advanced over a 0.014 inch Softip Synchro micro guidewire to the distal end of the 6 [place] guide catheter.  With the micro guidewire leading with a J-tip configuration and torque device, the combination was then navigated without difficulty to the junction of the infraorbital and the sphenopalatine branches in the internal maxillary vessel.  After having confirmed absence of dangerous anastomosis, embolization was then performed using PVA particles of 250-350 microns, 350-500 microns and 500-700 microns mixture mixed with 75% contrast and 25% heparinized saline infusion.  The embolizations were performed under intermittent fluoroscopic guidance until there was stasis and/or left flex at the tip of the micro catheter as the micro catheter was retrieved more proximally.  At the end of this, a control arteriogram performed through the 6 [place] guide catheter in the left external carotid artery demonstrated complete occlusion of the embolized vessels.  Attempts were then made to gain IV access into the significantly severely tortuous left facial artery proximally. Access was obtained with the micro guidewire and micro catheter.  However, there was severe spasm and occlusion.  This vessel was, therefore, not embolized.  A control arteriogram performed after removal of the micro catheter and the micro guidewire, with the 6 [place] guide catheter in the left common carotid artery demonstrated no evidence of abnormal intracranial filling defects.  In a similar fashion as described above,  access was obtained with the 6 [place] Brite tip Envoy guide catheter in the proximal right external carotid artery. Prior to this, a control arteriogram performed centered intracranially demonstrated no evidence of abnormal filling defects or occlusions or stenosis.  Again using biplane roadmap technique and constant fluoroscopic guidance, in a coaxial manner and with constant heparinized saline infusion, the combination of the rapid transit 2 tip micro catheter over a 0.014 inch Softip Transend EX micro guidewire was then advanced into the distal right internal maxillary artery just proximal to the infraorbital and the sphenopalatine arteries.  Again after having confirmed absence of dangerous anastomosis, embolization was then performed utilizing PVA particles sizes 250-350 microns, 350-500 microns, and 500-700 microns in a mixture of 75% contrast and 25% heparinized saline infusion.  Following achievement of stasis following complete embolization of these vessels, access was obtained into the right facial artery using biplane roadmap technique and constant fluoroscopic guidance. Again after having confirmed the absence of dangerous anastomosis, embolization was carried out with PVA particles of sizes as described above of the nasal branches of the facial artery.  Once  stasis had been achieved, the micro catheter was then gently retrieved and removed. A control arteriogram performed through the 6 [place] Envoy guide catheter in the right external carotid artery demonstrated complete stasis of the embolized vessels.  No other abnormal vessels were noted.  A control arteriogram performed through the 6 [place] guide catheter in the right common carotid artery demonstrated no evidence of intracranial filling defects or of occlusions.  Throughout the procedure, the patient's hemodynamic status and neurological status remained stable.  The patient's ACT was maintained in the region of approximately 160-180 seconds.  The 6  [place] guide catheter and the 6 [place] neurovascular sheath were then retrieved into the abdominal aorta and exchanged over a J-tip guidewire for a 6 [place] Pinnacle sheath. This in turn was then successfully replaced with an external closure device. The patient was given 7.5 mg of protamine sulfate to reverse the effects of IV heparin.  IMPRESSION: Status post super selective endovascular embolization of the left sphenopalatine, the infraorbital and the internal maxillary artery on the left, and of the right sphenopalatine, the infraorbital, the internal maxillary branches, and of the of nasal branches of the right facial artery using PVA particles as described above.  A rounded prominence of contrast is to the left of the midline in the left floor of the left orbit, being supplied by the left ophthalmic artery. The significance of this is unclear at this time. This may represent a pseudo aneurysm or a vascular abnormality. Further evaluation of this may be needed with either an orbital MRI MRA exam or a CT of the orbits with contrast.   Electronically Signed   By: Luanne Bras M.D.   On: 07/15/2014 10:03   Ir Neuro Each Add'l After Basic Uni Right (ms)  07/16/2014   CLINICAL DATA:  Recurrent severe epistaxis.  EXAM: IR ANGIO EXTERNAL CAROTID SEL EXT CAROTID BILAT MOD SED BILATERAL COMMON CAROTID ARTERIOGRAMS, BILATERAL EXTERNAL CAROTID ARTERIOGRAMS FOLLOWED BY SUPER SELECTIVE ENDOVASCULAR EMBOLIZATION OF INTERNAL MAXILLARY ARTERIES BILATERALLY, INFRAORBITAL ARTERIES BILATERALLY, AND OF THE RIGHT FACIAL ARTERY NASAL BRANCHES USING PVA PARTICLES.  ANESTHESIA/SEDATION: General anesthesia.  MEDICATIONS: As per general anesthesia.  CONTRAST:  60 mL OMNIPAQUE IOHEXOL 300 MG/ML  SOLN  PROCEDURE: Following a full explanation of the procedure along with the potential associated complications, an informed witnessed consent was obtained.  The patient was put under general anesthesia by the Department of  Anesthesiology at [facility].  The right groin was prepped and draped in the usual sterile fashion. Thereafter using modified Seldinger technique, transfemoral access into the right common femoral artery was obtained without difficulty. Over a 0.035 inch guidewire, a 5 French Pinnacle sheath was inserted. Through this, and also over a 0.035 inch guidewire a 5 French JB1 catheter was advanced to the aortic arch region and selectively positioned in the left common carotid artery, the left external carotid artery, the right common carotid artery and the right external carotid artery.  COMPLICATIONS: None immediate.  FINDINGS: The left common carotid arteriogram demonstrates the left external carotid artery at its origin and its major branches proximally normal.  The left internal carotid artery at the bulb to the cranial skull base appears widely patent.  The left internal carotid artery at its proximal two-thirds is widely patent.  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 No evidence of significant narrowing noted.  More distally the petrous, the cavernous and the supraclinoid segments are widely patent.  The  left middle and the left anterior cerebral arteries opacify normally into the capillary and venous phases. The right common carotid arteriogram demonstrates the right internal carotid artery at the bulb and its proximal one-third to be normal.  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  No associated significant narrowing is noted.  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  Distal to this the vessel is seen to opacify to the cranial skull base.  The petrous, the cavernous and the supraclinoid segments are widely patent.  The right middle and the  right anterior cerebral arteries opacify normally into the capillary and venous phases.  ENDOVASCULAR SUPER SELECTIVE EMBOLIZATION OF THE EXTERNAL CAROTID ARTERY BRANCHES BILATERALLY USING PVA PARTICLES SIZES 250-350 MICRONS, 350-500 MICRONS, AND 500-700 MICRONS.  The diagnostic JB1 catheter in the left common carotid artery was then exchanged over a 0.035 inch 300 cm Rosen exchange guidewire for a 6 French 65 cm neurovascular sheath using biplane roadmap technique and constant fluoroscopic guidance. Good aspiration was obtained from the side port of the 6 [place] Arrow neurovascular sheath. A gentle contrast injection demonstrated no evidence of spasm, dissections or of intraluminal filling defects.  This was then connected to continuous heparinized saline infusion.  Over the [facility] exchange guidewire and using biplane roadmap technique and constant fluoroscopic guidance, a 6 French 90 cm Brite tip Envoy guide catheter was then advanced and positioned in the proximal left external carotid artery. The guidewire was removed. Good aspiration was obtained from the hub of the 6 [place] Envoy guide catheter.  A control arteriogram performed through the left external carotid artery demonstrated abnormal prominence of the internal maxillary artery branches and of the sphenopalatine branch.  Using biplane roadmap technique and constant fluoroscopic guidance, in a coaxial manner and with constant heparinized saline infusion, a rapid transit micro catheter was then advanced over a 0.014 inch Softip Synchro micro guidewire to the distal end of the 6 [place] guide catheter.  With the micro guidewire leading with a J-tip configuration and torque device, the combination was then navigated without difficulty to the junction of the infraorbital and the sphenopalatine branches in the internal maxillary vessel.  After having confirmed absence of dangerous anastomosis, embolization was then performed using PVA particles of 250-350  microns, 350-500 microns and 500-700 microns mixture mixed with 75% contrast and 25% heparinized saline infusion.  The embolizations were performed under intermittent fluoroscopic guidance until there was stasis and/or left flex at the tip of the micro catheter as the micro catheter was retrieved more proximally.  At the end of this, a control arteriogram performed through the 6 [place] guide catheter in the left external carotid artery demonstrated complete occlusion of the embolized vessels.  Attempts were then made to gain IV access into the significantly severely tortuous left facial artery proximally. Access was obtained with the micro guidewire and micro catheter.  However, there was severe spasm and occlusion.  This vessel was, therefore, not embolized.  A control arteriogram performed after removal of the micro catheter and the micro guidewire, with the 6 [place] guide catheter in the left common carotid artery demonstrated no evidence of abnormal intracranial filling defects.  In a similar fashion as described above, access was obtained with the 6 French Brite tip Envoy guide catheter in the proximal right external carotid artery. Prior to this,  a control arteriogram performed centered intracranially demonstrated no evidence of abnormal filling defects or occlusions or stenosis.  Again using biplane roadmap technique and constant fluoroscopic guidance, in a coaxial manner and with constant heparinized saline infusion, the combination of the rapid transit 2 tip micro catheter over a 0.014 inch Softip Transend EX micro guidewire was then advanced into the distal right internal maxillary artery just proximal to the infraorbital and the sphenopalatine arteries.  Again after having confirmed absence of dangerous anastomosis, embolization was then performed utilizing PVA particles sizes 250-350 microns, 350-500 microns, and 500-700 microns in a mixture of 75% contrast and 25% heparinized saline infusion.  Following  achievement of stasis following complete embolization of these vessels, access was obtained into the right facial artery using biplane roadmap technique and constant fluoroscopic guidance. Again after having confirmed the absence of dangerous anastomosis, embolization was carried out with PVA particles of sizes as described above of the nasal branches of the facial artery.  Once stasis had been achieved, the micro catheter was then gently retrieved and removed. A control arteriogram performed through the 6 [place] Envoy guide catheter in the right external carotid artery demonstrated complete stasis of the embolized vessels.  No other abnormal vessels were noted.  A control arteriogram performed through the 6 [place] guide catheter in the right common carotid artery demonstrated no evidence of intracranial filling defects or of occlusions.  Throughout the procedure, the patient's hemodynamic status and neurological status remained stable.  The patient's ACT was maintained in the region of approximately 160-180 seconds.  The 6 [place] guide catheter and the 6 French neurovascular sheath were then retrieved into the abdominal aorta and exchanged over a J-tip guidewire for a 6 [place] Pinnacle sheath. This in turn was then successfully replaced with an external closure device. The patient was given 7.5 mg of protamine sulfate to reverse the effects of IV heparin.  IMPRESSION: Status post super selective endovascular embolization of the left sphenopalatine, the infraorbital and the internal maxillary artery on the left, and of the right sphenopalatine, the infraorbital, the internal maxillary branches, and of the of nasal branches of the right facial artery using PVA particles as described above.  A rounded prominence of contrast is to the left of the midline in the left floor of the left orbit, being supplied by the left ophthalmic artery. The significance of this is unclear at this time. This may represent a pseudo  aneurysm or a vascular abnormality. Further evaluation of this may be needed with either an orbital MRI MRA exam or a CT of the orbits with contrast.   Electronically Signed   By: Luanne Bras M.D.   On: 07/15/2014 10:03    Labs:  CBC:  Recent Labs  07/15/14 2006 07/16/14 0225 07/16/14 1912 07/17/14 1405  WBC 12.7* 11.6* 12.5* 12.4*  HGB 9.3* 8.8* 9.4* 10.6*  HCT 27.0* 25.7* 27.9* 31.8*  PLT 137* 144* 148* 171    COAGS:  Recent Labs  07/12/14 2111 07/14/14 0540 07/14/14 2034 07/15/14 0150  INR 2.09* 1.73* 1.58* 1.30  APTT  --   --   --  25    BMP:  Recent Labs  07/14/14 0859 07/15/14 0150 07/16/14 0225 07/16/14 1912  NA 139 142 137 137  K 3.8 3.9 3.4* 3.1*  CL 102 110 104 103  CO2 28 26 26 27   GLUCOSE 100* 139* 116* 117*  BUN 15 9 7  5*  CALCIUM 9.2 7.6* 7.8* 8.1*  CREATININE 0.74 0.70 0.64  0.9  GFRNONAA >60 >60 >60 >60  GFRAA >60 >60 >60 >60    LIVER FUNCTION TESTS:  Recent Labs  07/16/14 0225 07/16/14 1912  BILITOT 0.8 1.1  AST 27 35  ALT 15 16  ALKPHOS 43 49  PROT 4.9* 5.6*  ALBUMIN 2.9* 3.0*    Assessment and Plan: Afib on anticoagulation s/p fall Epistaxis  S/p embolization of bilateral IMAX arteries and infraorbital branches 6/5 S S/p left nasal endoscopy with cautery x 2-Dr. Simeon Craft Right groin hematoma-S/p OR evacuation and 15 F drain placed-removed today H/H stable    Signed: Hedy Jacob 07/17/2014, 4:28 PM   I spent a total of 15 Minutes in face to face in clinical consultation/evaluation, greater than 50% of which was counseling/coordinating care for epistaxis.

## 2014-07-17 NOTE — Progress Notes (Signed)
Medicare Important Message given? YES (If response is "NO", the following Medicare IM given date fields will be blank) Date Medicare IM given:07/17/14 Medicare IM given by: Abigaile Rossie 

## 2014-07-18 DIAGNOSIS — E876 Hypokalemia: Secondary | ICD-10-CM | POA: Diagnosis present

## 2014-07-18 LAB — CBC
HCT: 28.7 % — ABNORMAL LOW (ref 36.0–46.0)
Hemoglobin: 9.5 g/dL — ABNORMAL LOW (ref 12.0–15.0)
MCH: 28.7 pg (ref 26.0–34.0)
MCHC: 33.1 g/dL (ref 30.0–36.0)
MCV: 86.7 fL (ref 78.0–100.0)
PLATELETS: 183 10*3/uL (ref 150–400)
RBC: 3.31 MIL/uL — ABNORMAL LOW (ref 3.87–5.11)
RDW: 16.8 % — AB (ref 11.5–15.5)
WBC: 10.6 10*3/uL — ABNORMAL HIGH (ref 4.0–10.5)

## 2014-07-18 LAB — TYPE AND SCREEN
ABO/RH(D): A POS
Antibody Screen: NEGATIVE
UNIT DIVISION: 0
UNIT DIVISION: 0
Unit division: 0
Unit division: 0

## 2014-07-18 LAB — MAGNESIUM: Magnesium: 2 mg/dL (ref 1.7–2.4)

## 2014-07-18 LAB — BASIC METABOLIC PANEL
ANION GAP: 8 (ref 5–15)
CO2: 27 mmol/L (ref 22–32)
CREATININE: 0.51 mg/dL (ref 0.44–1.00)
Calcium: 8.2 mg/dL — ABNORMAL LOW (ref 8.9–10.3)
Chloride: 106 mmol/L (ref 101–111)
GFR calc non Af Amer: >60 mL/min (ref >60)
GLUCOSE: 111 mg/dL — AB (ref 65–99)
Potassium: 3.6 mmol/L (ref 3.5–5.1)
Sodium: 141 mmol/L (ref 135–145)

## 2014-07-18 MED ORDER — DILTIAZEM HCL 60 MG PO TABS
60.0000 mg | ORAL_TABLET | Freq: Four times a day (QID) | ORAL | Status: DC
  Administered 2014-07-18 – 2014-07-19 (×4): 60 mg via ORAL
  Filled 2014-07-18 (×8): qty 1

## 2014-07-18 MED ORDER — POTASSIUM CHLORIDE CRYS ER 20 MEQ PO TBCR
40.0000 meq | EXTENDED_RELEASE_TABLET | Freq: Once | ORAL | Status: AC
  Administered 2014-07-18: 40 meq via ORAL
  Filled 2014-07-18: qty 2

## 2014-07-18 NOTE — Telephone Encounter (Signed)
refill 

## 2014-07-18 NOTE — Progress Notes (Signed)
Patient Name: Kathryn Gibson Date of Encounter: 07/18/2014   Primary Cardiologist: Dr. Moise Boring. Allred    Principal Problem:   Epistaxis Active Problems:   BRADYCARDIA-TACHYCARDIA SYNDROME   CONSTIPATION, CHRONIC   Hypothyroidism   Essential hypertension, benign   Sick sinus syndrome   Diastolic dysfunction   Epistaxis, recurrent   Severe epistaxis   Hematoma   PAF (paroxysmal atrial fibrillation)   Acute blood loss anemia   Hypomagnesemia   Chronic atrial fibrillation   Essential hypertension   Other specified hypothyroidism   Posterior epistaxis    SUBJECTIVE  Denies any CP or SOB. Has been ambulating without problem.  CURRENT MEDS . amiodarone  50 mg Oral Daily  .  ceFAZolin (ANCEF) IV  1 g Intravenous Q12H  . diltiazem  60 mg Oral 3 times per day  . Glycerin (Adult)  1 suppository Rectal Once  . levothyroxine  50 mcg Oral QODAY   And  . levothyroxine  75 mcg Oral QODAY  . metoprolol succinate  50 mg Oral Daily  . polyethylene glycol  17 g Oral BID  . polyvinyl alcohol  1 drop Both Eyes Daily  . senna-docusate  1 tablet Oral BID  . silver sulfADIAZINE   Topical BID  . sodium chloride  3 mL Intravenous Q12H    OBJECTIVE  Filed Vitals:   07/17/14 2337 07/18/14 0434 07/18/14 0435 07/18/14 0725  BP:   120/69 135/70  Pulse:   88 102  Temp: 98.3 F (36.8 C) 98.1 F (36.7 C)  98.3 F (36.8 C)  TempSrc: Oral Oral  Oral  Resp:   21 16  Height:      Weight:      SpO2:   97% 98%    Intake/Output Summary (Last 24 hours) at 07/18/14 1032 Last data filed at 07/18/14 0725  Gross per 24 hour  Intake 730.83 ml  Output    600 ml  Net 130.83 ml   Filed Weights   07/14/14 0513 07/16/14 2040  Weight: 120 lb (54.432 kg) 135 lb 5.8 oz (61.4 kg)    PHYSICAL EXAM  General: Pleasant, NAD. Neuro: Alert and oriented X 3. Moves all extremities spontaneously. Psych: Normal affect. HEENT:  Normal  Neck: Supple without bruits or JVD. Lungs:  Resp regular  and unlabored, CTA. Heart: irregular. no s3, s4, or murmurs. Abdomen: Soft, non-tender, non-distended, BS + x 4.  Extremities: No clubbing, cyanosis or edema. DP/PT/Radials 2+ and equal bilaterally.  Accessory Clinical Findings  CBC  Recent Labs  07/16/14 1912 07/17/14 1405 07/18/14 0352  WBC 12.5* 12.4* 10.6*  NEUTROABS 8.7*  --   --   HGB 9.4* 10.6* 9.5*  HCT 27.9* 31.8* 28.7*  MCV 84.5 85.0 86.7  PLT 148* 171 630   Basic Metabolic Panel  Recent Labs  07/16/14 1912 07/18/14 0352  NA 137 141  K 3.1* 3.6  CL 103 106  CO2 27 27  GLUCOSE 117* 111*  BUN 5* <5*  CREATININE 0.59 0.51  CALCIUM 8.1* 8.2*  MG 1.9 2.0   Liver Function Tests  Recent Labs  07/16/14 0225 07/16/14 1912  AST 27 35  ALT 15 16  ALKPHOS 43 49  BILITOT 0.8 1.1  PROT 4.9* 5.6*  ALBUMIN 2.9* 3.0*    TELE A-fib with HR high 90-110s    ECG  No new EKG  Echocardiogram 04/09/2011  LV EF: 55% -  60%  ------------------------------------------------------------ Indications:   424.1 Aortic valve disorders.  ------------------------------------------------------------ History:  PMH: Acquired from the patient and from the patient's chart. PMH: Atrial Fibrillation. Dyspnea. Hypothyroidism. Palpitations. Tachy-Brady Syndrome. Risk factors: Former tobacco use.  ------------------------------------------------------------ Study Conclusions  - Left ventricle: The cavity size was normal. Wall thickness was normal. Systolic function was normal. The estimated ejection fraction was in the range of 55% to 60%. Wall motion was normal; there were no regional wall motion abnormalities. Features are consistent with a pseudonormal left ventricular filling pattern, with concomitant abnormal relaxation and increased filling pressure (grade 2 diastolic dysfunction). - Aortic valve: Mild regurgitation. - Mitral valve: Mild regurgitation. - Left atrium: The atrium was mildly  dilated. - Pulmonary arteries: Systolic pressure was mildly increased. PA peak pressure: 46mm Hg (S).    Radiology/Studies  Ct Pelvis Wo Contrast  07/15/2014   CLINICAL DATA:  Status post arteriographic procedures yesterday with embolization for treatment of uncontrolled epistaxis. There is drop in hemoglobin with clinical right groin/thigh hematoma.  EXAM: CT PELVIS WITHOUT CONTRAST  TECHNIQUE: Multidetector CT imaging of the pelvis was performed following the standard protocol without intravenous contrast.  COMPARISON:  None.  FINDINGS: There is a large focal hematoma of the right groin beginning below the inguinal ligament and extending into the medial proximal thigh. On axial images, maximal hematoma dimensions are approximately 5.3 x 11.5 cm. There is adjacent stranding in the fat. This hematoma appears to lie outside of the muscular compartments. There is no evidence of retroperitoneal or peritoneal hemorrhage in the pelvis. The bladder contains a Foley catheter. No incidental masses. Bony structures are within normal limits.  IMPRESSION: Focal hematoma of the right groin extending into the medial proximal thigh. Maximal dimensions are approximately 12 cm on axial images. No intraperitoneal or retroperitoneal hemorrhage within the pelvis.   Electronically Signed   By: Aletta Edouard M.D.   On: 07/15/2014 13:16    ASSESSMENT AND PLAN   1. PAF with RVR - CHA2DS2-Vasc score at least 4 (female, HTN, age) - Likely will be off coumadin for 3-4 weeks  - continue toprol XL, HR only boderline controlled, will increase diltiazem to 60mg  q6HR, likely can consolidate to 240mg  or 300mg  diltiazem CD tomorrow   2. L sided epistaxis - s/p endoscopic cauterization x 2 by ENT and maxillary artery occlusion by IR  3. R groin hematoma s/p evacuation by vascular on 6/6 - U/S large hematoma, negative for AV fistula or pseudoanuerysm  4. HTN 5.  Hypothyroidism 6. history of tachybradycardia syndrome status post [employer] pacemaker  Signed, Woodward Ku Pager: [phone]  She is fine to participate in PT do not fixate on HR  Meds adjusted  Would have ENT re evaluate for Left tear duct drainage and sinus pressure.  No anticoagulation for 3-4 weeks  Jenkins Rouge

## 2014-07-18 NOTE — Progress Notes (Signed)
Physical Therapy Treatment Patient Details Name: Kathryn Gibson MRN: [id number] DOB: Sep 06, 1933 Today's Date: 08-12-2014    History of Present Illness This is a 81 year old who presented with recurrent left severe refractory Coumadin-related epistaxis s/p Left endoscopic control of epistaxis/nasal endoscopy with cauterization and evac of hematoma.    PT Comments    Pt with increased gait and balance but with utilization of berg assessment remains a significant fall risk. Pt with education for HEP and states she has been performing throughout the day as well as education for current balance deficits and fall risk. Will continue to follow.   Follow Up Recommendations  SNF;Home health PT (if pt not back to [facility] plans on ST-SNF)     Equipment Recommendations       Recommendations for Other Services       Precautions / Restrictions Precautions Precautions: Fall    Mobility  Bed Mobility Overal bed mobility: Modified Independent                Transfers Overall transfer level: Modified independent                  Ambulation/Gait Ambulation/Gait assistance: Supervision Ambulation Distance (Feet): 300 Feet Assistive device: None Gait Pattern/deviations: Step-through pattern   Gait velocity interpretation: at or above normal speed for age/gender General Gait Details: guarded and mildly unsteady   Stairs            Wheelchair Mobility    Modified Rankin (Stroke Patients Only)       Balance     Sitting balance-Leahy Scale: Good       Standing balance-Leahy Scale: Good                      Cognition Arousal/Alertness: Awake/alert Behavior During Therapy: WFL for tasks assessed/performed Overall Cognitive Status: Within Functional Limits for tasks assessed                      Exercises General Exercises - Lower Extremity Hip ABduction/ADduction: AROM;Both;20 reps;Supine Hip Flexion/Marching: AROM;Seated;Both;20 reps     General Comments        Pertinent Vitals/Pain Pain Assessment: No/denies pain  HR 95-115    Home Living                      Prior Function            PT Goals (current goals can now be found in the care plan section) Progress towards PT goals: Progressing toward goals    Frequency       PT Plan Current plan remains appropriate    Co-evaluation             End of Session   Activity Tolerance: Patient tolerated treatment well Patient left: in bed;with call bell/phone within Gibson     Time: 1414-1435 PT Time Calculation (min) (ACUTE ONLY): 21 min  Charges:  $Gait Training: 8-22 mins                    G Codes:      Kathryn Gibson 08/12/14, 3:02 PM Kathryn Gibson, [place]

## 2014-07-18 NOTE — Progress Notes (Signed)
[facility]  Subjective  - Still complains of pain, but some better than yesterday.  She also complains of dry mouth.   Objective 120/69 88 98.1 F (36.7 C) (Oral) 21 97%  Intake/Output Summary (Last 24 hours) at 07/18/14 0758 Last data filed at 07/18/14 0600  Gross per 24 hour  Intake 780.83 ml  Output    975 ml  Net -194.17 ml    Right medial thigh healing well, incision without active drainage and compartments are soft Distally palpable DP2+ Heart irregularly irregular Lungs non labored breathing  Assessment/Planning: POD # 3 Hematoma evacuation right groin  Skin is healing, incision clean and dry Foley was d/c'd  A fib followed by cardiology as well as DVT prophylaxis HGB 9.5 today asymptomatic when getting up, no current active bleeding Continue BID silvadene dressing changes to medial right thigh  Theda Sers [facility] MAUREEN 07/18/2014 7:58 AM --  Laboratory Lab Results:  Recent Labs  07/17/14 1405 07/18/14 0352  WBC 12.4* 10.6*  HGB 10.6* 9.5*  HCT 31.8* 28.7*  PLT 171 183   BMET  Recent Labs  07/16/14 1912 07/18/14 0352  NA 137 141  K 3.1* 3.6  CL 103 106  CO2 27 27  GLUCOSE 117* 111*  BUN 5* <5*  CREATININE 0.59 0.51  CALCIUM 8.1* 8.2*    COAG Lab Results  Component Value Date   INR 1.30 07/15/2014   INR 1.58* 07/14/2014   INR 1.73* 07/14/2014   No results found for: PTT

## 2014-07-18 NOTE — Progress Notes (Signed)
[facility] TEAM 1 - Stepdown/ICU TEAM Progress Note  Kathryn Gibson JSE:[id number] DOB: 09-16-1933 DOA: 07/14/2014 PCP: Tivis Ringer, MD  Admit HPI / Brief Narrative: 81 year old WF PMHx Coumadin for A-fib, sick sinus syndrome with pacer, HTN, hypothyroidism.  Presents with left epistaxis. Was apparently seen at [facility], had packing that did not work, EMT tried gauze, patient has had persistent epistaxis and ER consulted ENT for control. She now presents for endoscopy with control of epistaxis. Dr. Simeon Craft, Al[place] Dust has discussed the risks (bleeding, infection, anesthesia risks, septal perforation, etc.), benefits, and alternatives of this procedure. The patient understands the risks and would like to proceed with the procedure. The chances of success of the procedure are >50% and the patient understands this. I personally performed an examination of the patient within 24 hours of the procedure.  HPI/Subjective: 6/9 A/O 4, positive excessive tearing, positive left cheek and ear pressure, negative SOB, negative CP  Assessment/Plan: Refractory epistaxis -Care as per ENT -Continue antibiotics per ENT  R groin hemorrhage at puncture site -Care as per vascular surgery who has been consulted by interventional radiology  Acute blood loss anemia -6/6 transfused with 2 units PRBC -Transfuse for Hemoglobin< 7.  Hypokalemia -Potassium goal> 4 -K Dur 40 mEq 1  Hypomagnesemia Replace and follow  Chronic atrial fibrillation/Frequent PVC -Started Diltiazem gtt no  Bolus; cardiology not transitioning patient to PO Cardizem  -Continue Toprol 50 mg daily -Rate not yet well controlled.  HTN -See chronic atrial fibrillation   Hypothyroidism -Continue home Synthroid dose  SSS s/p pacer   Chronic constipation    Code Status: FULL Family Communication: no family present at time of exam Disposition Plan: Per ENT/cardiology    Consultants: Vascular  surgery ENT Cardiology   Procedure/Significant Events: 6/5 S/P bilateral common carotid and bilateral external carotid arteriograms followed superselective embolization of Lt IMAX and infraorbital branches ,and Rt IMAX ,Infraorbital and nasal branches of facial artery using PVA particles 6/6 Evacuation of large hematoma right medial thigh and placement of a 15 Blake drain -6/6 transfused with 2 units PRBC   Culture   Antibiotics: Cefazolin 6/5 >  DVT prophylaxis: SCDs   Devices    LINES / TUBES:      Continuous Infusions: . sodium chloride 10 mL/hr at 07/18/14 0700    Objective: VITAL SIGNS: Temp: 97.7 F (36.5 C) (06/09 1908) Temp Source: Oral (06/09 1908) BP: 111/58 mmHg (06/09 1556) Pulse Rate: 115 (06/09 1448) SPO2; FIO2:   Intake/Output Summary (Last 24 hours) at 07/18/14 2152 Last data filed at 07/18/14 1916  Gross per 24 hour  Intake    120 ml  Output    500 ml  Net   -380 ml     Exam: General: A/O 4, No acute respiratory distress, discomfort with left nare packing Lungs: Clear to auscultation bilaterally without wheezes or crackles Cardiovascular: Irregular irregular rhythm and rate, without murmur gallop or rub normal S1 and S2 Abdomen: Nontender, nondistended, soft, bowel sounds positive, no rebound, no ascites, no appreciable mass Extremities: No significant cyanosis, clubbing, or edema bilateral lower extremities - large hematoma in right groin extending into posterior aspect of right thigh (resolving)  Psychiatric:  Negative depression, negative anxiety, negative fatigue, negative mania  Neurologic:  Cranial nerves II through XII intact, tongue/uvula midline, all extremities muscle strength 5/5, sensation intact throughout, negative dysarthria, negative expressive aphasia, negative receptive aphasia.     Data Reviewed: Basic Metabolic Panel:  Recent Labs Lab 07/14/14 0859 07/14/14 2034 07/15/14 0150  07/16/14 0225 07/16/14 1912  07/18/14 0352  NA 139  --  142 137 137 141  K 3.8  --  3.9 3.4* 3.1* 3.6  CL 102  --  110 104 103 106  CO2 28  --  26 26 27 27   GLUCOSE 100*  --  139* 116* 117* 111*  BUN 15  --  9 7 5* <5*  CREATININE 0.74  --  0.70 0.64 0.59 0.51  CALCIUM 9.2  --  7.6* 7.8* 8.1* 8.2*  MG  --  1.6*  --  1.8 1.9 2.0   Liver Function Tests:  Recent Labs Lab 07/16/14 0225 07/16/14 1912  AST 27 35  ALT 15 16  ALKPHOS 43 49  BILITOT 0.8 1.1  PROT 4.9* 5.6*  ALBUMIN 2.9* 3.0*   No results for input(s): LIPASE, AMYLASE in the last 168 hours. No results for input(s): AMMONIA in the last 168 hours. CBC:  Recent Labs Lab 07/15/14 0150  07/15/14 2006 07/16/14 0225 07/16/14 1912 07/17/14 1405 07/18/14 0352  WBC 8.9  < > 12.7* 11.6* 12.5* 12.4* 10.6*  NEUTROABS 7.0  --   --   --  8.7*  --   --   HGB 7.4*  < > 9.3* 8.8* 9.4* 10.6* 9.5*  HCT 22.2*  < > 27.0* 25.7* 27.9* 31.8* 28.7*  MCV 89.5  < > 83.6 83.4 84.5 85.0 86.7  PLT 171  < > 137* 144* 148* 171 183  < > = values in this interval not displayed. Cardiac Enzymes: No results for input(s): CKTOTAL, CKMB, CKMBINDEX, TROPONINI in the last 168 hours. BNP (last 3 results) No results for input(s): BNP in the last 8760 hours.  ProBNP (last 3 results) No results for input(s): PROBNP in the last 8760 hours.  CBG: No results for input(s): GLUCAP in the last 168 hours.  Recent Results (from the past 240 hour(s))  MRSA PCR Screening     Status: None   Collection Time: 07/16/14  9:00 PM  Result Value Ref Range Status   MRSA by PCR NEGATIVE NEGATIVE Final    Comment:        The GeneXpert MRSA Assay (FDA approved for NASAL specimens only), is one component of a comprehensive MRSA colonization surveillance program. It is not intended to diagnose MRSA infection nor to guide or monitor treatment for MRSA infections.      Studies:  Recent x-ray studies have been reviewed in detail by the Attending Physician  Scheduled  Meds:  Scheduled Meds: . amiodarone  50 mg Oral Daily  .  ceFAZolin (ANCEF) IV  1 g Intravenous Q12H  . diltiazem  60 mg Oral 4 times per day  . Glycerin (Adult)  1 suppository Rectal Once  . levothyroxine  50 mcg Oral QODAY   And  . levothyroxine  75 mcg Oral QODAY  . metoprolol succinate  50 mg Oral Daily  . polyethylene glycol  17 g Oral BID  . polyvinyl alcohol  1 drop Both Eyes Daily  . senna-docusate  1 tablet Oral BID  . silver sulfADIAZINE   Topical BID  . sodium chloride  3 mL Intravenous Q12H    Time spent on care of this patient: 40 mins   Vondell Babers, Geraldo Docker , MD  [facility] Office  [phone] Pager - [phone]  On-Call/Text Page:      [email or web address]      password [email or web address]  If 7PM-7AM, please contact night-coverage [email or web address] Password [email or web address] 07/18/2014, 9:52 PM   LOS:  4 days   Care during the described time interval was provided by me .  I have reviewed this patient's available data, including medical history, events of note, physical examination, and all test results as part of my evaluation. I have personally reviewed and interpreted all radiology studies.   Dia Crawford, MD [phone] Pager

## 2014-07-18 NOTE — Progress Notes (Signed)
PT Cancellation Note  Patient Details Name: BRITTINI BRUBECK MRN: [id number] DOB: 1933/06/02   Cancelled Treatment:    Reason Eval/Treat Not Completed: Medical issues which prohibited therapy (pt in uncontrollled a-fib, HR 112-160 at rest. Will check back later today after she receives her medication.)   Philomena Doheny 07/18/2014, 9:44 AM [phone]

## 2014-07-18 NOTE — Progress Notes (Signed)
Note in cardiology evaluation to have ENT evaluate for sinus pressure and tear duct drainage. Patient had life threatening refractory epistaxis that required multiple episodes of packing and endoscopic cauterization x 2. Currently has extensive left sinus cavity absorbable packing that was required to stop her life-threatening refractory epistaxis. The tear duct drainage is expected/routine tear duct drainage/epiphora from blockage of the inferior meatus opening of the tear duct from the absorbable packing and the sinus pressure is also from the absorbable packing. The risk of removing her absorbable packing FAR outweighs the sinus pressure and tearing symptoms as if patient has another episode of epistaxis she has failed all surgical and endovascular options and the only other options would be transfer to [facility] and/or consideration of transcervical ligation of carotid artery vessels. The absorbable packing will dissolve on its own in 2-3 weeks and the packing is routinely used in sinus and CSF leak surgeries and the tearing/sinus pressure will resolve on its own as the packing dissolves.

## 2014-07-19 LAB — COMPREHENSIVE METABOLIC PANEL
ALK PHOS: 49 U/L (ref 38–126)
ALT: 13 U/L — ABNORMAL LOW (ref 14–54)
ANION GAP: 9 (ref 5–15)
AST: 22 U/L (ref 15–41)
Albumin: 2.7 g/dL — ABNORMAL LOW (ref 3.5–5.0)
BUN: 7 mg/dL (ref 6–20)
CO2: 27 mmol/L (ref 22–32)
CREATININE: 0.63 mg/dL (ref 0.44–1.00)
Calcium: 8.3 mg/dL — ABNORMAL LOW (ref 8.9–10.3)
Chloride: 104 mmol/L (ref 101–111)
GFR calc non Af Amer: >60 mL/min (ref >60)
Glucose, Bld: 109 mg/dL — ABNORMAL HIGH (ref 65–99)
Potassium: 3.6 mmol/L (ref 3.5–5.1)
Sodium: 140 mmol/L (ref 135–145)
TOTAL PROTEIN: 5.4 g/dL — AB (ref 6.5–8.1)
Total Bilirubin: 0.8 mg/dL (ref 0.3–1.2)

## 2014-07-19 LAB — CBC WITH DIFFERENTIAL/PLATELET
Basophils Absolute: 0 10*3/uL (ref 0.0–0.1)
Basophils Relative: 0 % (ref 0–1)
Eosinophils Absolute: 0.2 10*3/uL (ref 0.0–0.7)
Eosinophils Relative: 3 % (ref 0–5)
HEMATOCRIT: 27.7 % — AB (ref 36.0–46.0)
Hemoglobin: 9.1 g/dL — ABNORMAL LOW (ref 12.0–15.0)
LYMPHS PCT: 19 % (ref 12–46)
Lymphs Abs: 1.8 10*3/uL (ref 0.7–4.0)
MCH: 28.7 pg (ref 26.0–34.0)
MCHC: 32.9 g/dL (ref 30.0–36.0)
MCV: 87.4 fL (ref 78.0–100.0)
MONO ABS: 1.9 10*3/uL — AB (ref 0.1–1.0)
Monocytes Relative: 20 % — ABNORMAL HIGH (ref 3–12)
Neutro Abs: 5.5 10*3/uL (ref 1.7–7.7)
Neutrophils Relative %: 58 % (ref 43–77)
Platelets: 234 10*3/uL (ref 150–400)
RBC: 3.17 MIL/uL — ABNORMAL LOW (ref 3.87–5.11)
RDW: 16.7 % — ABNORMAL HIGH (ref 11.5–15.5)
WBC: 9.4 10*3/uL (ref 4.0–10.5)

## 2014-07-19 LAB — URINALYSIS, ROUTINE W REFLEX MICROSCOPIC
Bilirubin Urine: NEGATIVE
Glucose, UA: NEGATIVE mg/dL
Hgb urine dipstick: NEGATIVE
KETONES UR: NEGATIVE mg/dL
LEUKOCYTES UA: NEGATIVE
NITRITE: NEGATIVE
PH: 7.5 (ref 5.0–8.0)
Protein, ur: NEGATIVE mg/dL
Specific Gravity, Urine: 1.005 (ref 1.005–1.030)
Urobilinogen, UA: 0.2 mg/dL (ref 0.0–1.0)

## 2014-07-19 LAB — MAGNESIUM: Magnesium: 2 mg/dL (ref 1.7–2.4)

## 2014-07-19 MED ORDER — DILTIAZEM HCL 90 MG PO TABS
90.0000 mg | ORAL_TABLET | Freq: Four times a day (QID) | ORAL | Status: AC
  Administered 2014-07-19: 90 mg via ORAL
  Filled 2014-07-19 (×3): qty 1

## 2014-07-19 MED ORDER — DILTIAZEM HCL ER COATED BEADS 360 MG PO CP24
360.0000 mg | ORAL_CAPSULE | Freq: Every day | ORAL | Status: DC
  Administered 2014-07-20 – 2014-07-22 (×3): 360 mg via ORAL
  Filled 2014-07-19 (×3): qty 1

## 2014-07-19 NOTE — Progress Notes (Signed)
Subjective: Patient states she isn't having as good of a day today. She does still c/o right groin pain but improving.   Allergies: Review of patient's allergies indicates no known allergies.  Medications: Prior to Admission medications   Medication Sig Start Date End Date Taking? Authorizing Provider  acetaminophen (TYLENOL) 500 MG tablet Take 500-1,000 mg by mouth every 6 (six) hours as needed for moderate pain or headache.   Yes Historical Provider, MD  amiodarone (PACERONE) 100 MG tablet Take 50 mg by mouth daily.   Yes Historical Provider, MD  Ascorbic Acid (VITAMIN C) 1000 MG tablet Take 1,000 mg by mouth daily.     Yes Historical Provider, MD  aspirin 81 MG tablet Take 81 mg by mouth daily.     Yes Historical Provider, MD  Calcium-Vitamin D (CALTRATE 600 PLUS-VIT D PO) Take 1 tablet by mouth daily.   Yes Historical Provider, MD  cholecalciferol (VITAMIN D) 1000 UNITS tablet Take 1,000 Units by mouth daily.     Yes Historical Provider, MD  levothyroxine (SYNTHROID, LEVOTHROID) 50 MCG tablet Take 66mcg by mouth every other day and take 75 mcg by mouth on alternate days   Yes Historical Provider, MD  LYSINE PO Take 1-2 tablets by mouth daily as needed (fever blister).   Yes Historical Provider, MD  metoprolol succinate (TOPROL-XL) 50 MG 24 hr tablet Take 1 tablet (50 mg total) by mouth daily. 04/16/14  Yes Darlin Coco, MD  Multiple Vitamin (MULTIVITAMIN) capsule Take 1 capsule by mouth daily.     Yes Historical Provider, MD  Omega-3 Fatty Acids (FISH OIL) 1200 MG CAPS Take 1 capsule by mouth daily. Mega Red.   Yes Historical Provider, MD  Polyethyl Glycol-Propyl Glycol (SYSTANE OP) Apply 1-2 drops to eye daily.   Yes Historical Provider, MD  polyethylene glycol powder (MIRALAX) powder Take two capfuls by mouth at bedtime   Yes Historical Provider, MD  Probiotic Product (ALIGN PO) Take 1 capsule by mouth daily.    Yes Historical Provider, MD  temazepam (RESTORIL) 30 MG capsule Take 30  mg by mouth at bedtime.    Yes Historical Provider, MD  verapamil (CALAN-SR) 240 MG CR tablet TAKE 1 BY MOUTH AT BEDTIME 05/27/14  Yes Darlin Coco, MD  warfarin (COUMADIN) 5 MG tablet Take as directed by coumadin clinic 07/15/14   Darlin Coco, MD   Vital Signs: BP 119/68 mmHg  Pulse 95  Temp(Src) 98.1 F (36.7 C) (Oral)  Resp 12  Ht 5\' 1"  (1.549 m)  Wt 135 lb 5.8 oz (61.4 kg)  BMI 25.59 kg/m2  SpO2 100%  Physical Exam General: A&Ox3, NAD, sitting up in chair Periorbital ecchymosis improving, packing intact Ext: Right groin dressing intact, soft, ecchymosis improving  Imaging: Ct Pelvis Wo Contrast  07/15/2014   CLINICAL DATA:  Status post arteriographic procedures yesterday with embolization for treatment of uncontrolled epistaxis. There is drop in hemoglobin with clinical right groin/thigh hematoma.  EXAM: CT PELVIS WITHOUT CONTRAST  TECHNIQUE: Multidetector CT imaging of the pelvis was performed following the standard protocol without intravenous contrast.  COMPARISON:  None.  FINDINGS: There is a large focal hematoma of the right groin beginning below the inguinal ligament and extending into the medial proximal thigh. On axial images, maximal hematoma dimensions are approximately 5.3 x 11.5 cm. There is adjacent stranding in the fat. This hematoma appears to lie outside of the muscular compartments. There is no evidence of retroperitoneal or peritoneal hemorrhage in the pelvis. The bladder contains a Foley  catheter. No incidental masses. Bony structures are within normal limits.  IMPRESSION: Focal hematoma of the right groin extending into the medial proximal thigh. Maximal dimensions are approximately 12 cm on axial images. No intraperitoneal or retroperitoneal hemorrhage within the pelvis.   Electronically Signed   By: Aletta Edouard M.D.   On: 07/15/2014 13:16    Labs:  CBC:  Recent Labs  07/16/14 1912 07/17/14 1405 07/18/14 0352 07/19/14 0245  WBC 12.5* 12.4* 10.6*  9.4  HGB 9.4* 10.6* 9.5* 9.1*  HCT 27.9* 31.8* 28.7* 27.7*  PLT 148* 171 183 234    COAGS:  Recent Labs  07/12/14 2111 07/14/14 0540 07/14/14 2034 07/15/14 0150  INR 2.09* 1.73* 1.58* 1.30  APTT  --   --   --  25    BMP:  Recent Labs  07/16/14 0225 07/16/14 1912 07/18/14 0352 07/19/14 0245  NA 137 137 141 140  K 3.4* 3.1* 3.6 3.6  CL 104 103 106 104  CO2 26 27 27 27   GLUCOSE 116* 117* 111* 109*  BUN 7 5* <5* 7  CALCIUM 7.8* 8.1* 8.2* 8.3*  CREATININE 0.64 0.59 0.51 0.63  GFRNONAA >60 >60 >60 >60  GFRAA >60 >60 >60 >60    LIVER FUNCTION TESTS:  Recent Labs  07/16/14 0225 07/16/14 1912 07/19/14 0245  BILITOT 0.8 1.1 0.8  AST 27 35 22  ALT 15 16 13*  ALKPHOS 43 49 49  PROT 4.9* 5.6* 5.4*  ALBUMIN 2.9* 3.0* 2.7*    Assessment and Plan: Afib on anticoagulation s/p fall Epistaxis  S/p embolization of bilateral IMAX arteries and infraorbital branches 6/5  S/p left nasal endoscopy with cautery x 2-Dr. Simeon Craft Right groin hematoma-S/p OR evacuation and 15 F drain placed-removed, hematoma soft, appreciate vascular surgery assistance, continue BID silvadene    Signed: Hedy Jacob 07/19/2014, 12:24 PM   I spent a total of 15 Minutes in face to face in clinical consultation/evaluation, greater than 50% of which was counseling/coordinating care for epistaxis.

## 2014-07-19 NOTE — Progress Notes (Signed)
Pt with complaints of difficulty starting urine stream at start of shift. Increased frequency throughout the night. Post void residual checked, bladder scan showed 412. Tylene Fantasia, NP(o/c) notified, order received for I&O cath and UA. Before cathing pt spontaneously voided 337ml, so I&O canceled and urine sent to lab

## 2014-07-19 NOTE — Progress Notes (Signed)
Patient ID: YAZMYN VALBUENA, female   DOB: [birth year], 81 y.o.   MRN: [id number]   Patient Name: Kathryn Gibson Date of Encounter: 07/19/2014   Primary Cardiologist: Dr. Moise Boring. Allred    Principal Problem:   Epistaxis Active Problems:   BRADYCARDIA-TACHYCARDIA SYNDROME   CONSTIPATION, CHRONIC   Hypothyroidism   Essential hypertension, benign   Sick sinus syndrome   Diastolic dysfunction   Epistaxis, recurrent   Severe epistaxis   Hematoma   PAF (paroxysmal atrial fibrillation)   Acute blood loss anemia   Hypomagnesemia   Chronic atrial fibrillation   Essential hypertension   Other specified hypothyroidism   Posterior epistaxis   Hypokalemia    SUBJECTIVE  Denies any CP or SOB. Has been ambulating without problem.  CURRENT MEDS . amiodarone  50 mg Oral Daily  .  ceFAZolin (ANCEF) IV  1 g Intravenous Q12H  . diltiazem  60 mg Oral 4 times per day  . Glycerin (Adult)  1 suppository Rectal Once  . levothyroxine  50 mcg Oral QODAY   And  . levothyroxine  75 mcg Oral QODAY  . metoprolol succinate  50 mg Oral Daily  . polyethylene glycol  17 g Oral BID  . polyvinyl alcohol  1 drop Both Eyes Daily  . senna-docusate  1 tablet Oral BID  . silver sulfADIAZINE   Topical BID  . sodium chloride  3 mL Intravenous Q12H    OBJECTIVE  Filed Vitals:   07/19/14 0735 07/19/14 0736 07/19/14 0737 07/19/14 1251  BP:  119/68    Pulse: 88 123 95   Temp:    98.8 F (37.1 C)  TempSrc:    Oral  Resp: 27 21 12    Height:      Weight:      SpO2: 99% 99% 100%     Intake/Output Summary (Last 24 hours) at 07/19/14 1256 Last data filed at 07/19/14 1025  Gross per 24 hour  Intake    120 ml  Output   1404 ml  Net  -1284 ml   Filed Weights   07/14/14 0513 07/16/14 2040  Weight: 54.432 kg (120 lb) 61.4 kg (135 lb 5.8 oz)    PHYSICAL EXAM  General: Pleasant, NAD. Neuro: Alert and oriented X 3. Moves all extremities spontaneously. Psych: Normal affect. HEENT:   Normal  Neck: Supple without bruits or JVD. Lungs:  Resp regular and unlabored, CTA. Heart: irregular. no s3, s4, or murmurs. Abdomen: Soft, non-tender, non-distended, BS + x 4.  Extremities: No clubbing, cyanosis or edema. DP/PT/Radials 2+ and equal bilaterally.  Accessory Clinical Findings  CBC  Recent Labs  07/16/14 1912  07/18/14 0352 07/19/14 0245  WBC 12.5*  < > 10.6* 9.4  NEUTROABS 8.7*  --   --  5.5  HGB 9.4*  < > 9.5* 9.1*  HCT 27.9*  < > 28.7* 27.7*  MCV 84.5  < > 86.7 87.4  PLT 148*  < > 183 234  < > = values in this interval not displayed. Basic Metabolic Panel  Recent Labs  07/18/14 0352 07/19/14 0245  NA 141 140  K 3.6 3.6  CL 106 104  CO2 27 27  GLUCOSE 111* 109*  BUN <5* 7  CREATININE 0.51 0.63  CALCIUM 8.2* 8.3*  MG 2.0 2.0   Liver Function Tests  Recent Labs  07/16/14 1912 07/19/14 0245  AST 35 22  ALT 16 13*  ALKPHOS 49 49  BILITOT 1.1 0.8  PROT 5.6* 5.4*  ALBUMIN  3.0* 2.7*    TELE A-fib with HR high 90-110s    ECG  No new EKG  Echocardiogram 04/09/2011  LV EF: 55% -  60%  ------------------------------------------------------------ Indications:   424.1 Aortic valve disorders.  ------------------------------------------------------------ History:  PMH: Acquired from the patient and from the patient's chart. PMH: Atrial Fibrillation. Dyspnea. Hypothyroidism. Palpitations. Tachy-Brady Syndrome. Risk factors: Former tobacco use.  ------------------------------------------------------------ Study Conclusions  - Left ventricle: The cavity size was normal. Wall thickness was normal. Systolic function was normal. The estimated ejection fraction was in the range of 55% to 60%. Wall motion was normal; there were no regional wall motion abnormalities. Features are consistent with a pseudonormal left ventricular filling pattern, with concomitant abnormal relaxation and increased filling pressure (grade 2  diastolic dysfunction). - Aortic valve: Mild regurgitation. - Mitral valve: Mild regurgitation. - Left atrium: The atrium was mildly dilated. - Pulmonary arteries: Systolic pressure was mildly increased. PA peak pressure: 15mm Hg (S).    Radiology/Studies  Ct Pelvis Wo Contrast  07/15/2014   CLINICAL DATA:  Status post arteriographic procedures yesterday with embolization for treatment of uncontrolled epistaxis. There is drop in hemoglobin with clinical right groin/thigh hematoma.  EXAM: CT PELVIS WITHOUT CONTRAST  TECHNIQUE: Multidetector CT imaging of the pelvis was performed following the standard protocol without intravenous contrast.  COMPARISON:  None.  FINDINGS: There is a large focal hematoma of the right groin beginning below the inguinal ligament and extending into the medial proximal thigh. On axial images, maximal hematoma dimensions are approximately 5.3 x 11.5 cm. There is adjacent stranding in the fat. This hematoma appears to lie outside of the muscular compartments. There is no evidence of retroperitoneal or peritoneal hemorrhage in the pelvis. The bladder contains a Foley catheter. No incidental masses. Bony structures are within normal limits.  IMPRESSION: Focal hematoma of the right groin extending into the medial proximal thigh. Maximal dimensions are approximately 12 cm on axial images. No intraperitoneal or retroperitoneal hemorrhage within the pelvis.   Electronically Signed   By: Aletta Edouard M.D.   On: 07/15/2014 13:16    ASSESSMENT AND PLAN   1. PAF with RVR - CHA2DS2-Vasc score at least 4 (female, HTN, age) - Likely will be off coumadin for 3-4 weeks  - continue toprol XL, HR only boderline controlled, will increase diltiazem to 60mg  q6HR, likely can consolidate to 240mg  or 300mg  diltiazem CD tomorrow   2. L sided epistaxis - s/p endoscopic cauterization x 2 by ENT and maxillary artery occlusion by IR Appreciate ENT note.   Tear duct drainage and sinus pressure normal due to  Resorbable packing still in place that is blocking duct and causing pressure  Fluid clear   3. R groin hematoma s/p evacuation by vascular on 6/6 - U/S large hematoma, negative for AV fistula or pseudoanuerysm  4. HTN 5. Hypothyroidism 6. history of tachybradycardia syndrome status post [employer] pacemaker   [employer]

## 2014-07-19 NOTE — Progress Notes (Signed)
Brilinta  pamplet with enclosed 30 day free card given to pt. Awaiting benefits check , CM  will f/u with pt with results prior to d/c. [phone], has Brilinta in stock, CM made pt aware. CM will f/u with d/cneeds.

## 2014-07-19 NOTE — Progress Notes (Signed)
UR COMPLETED  

## 2014-07-19 NOTE — Progress Notes (Signed)
Patient Name: Kathryn Gibson Date of Encounter: 07/19/2014  Principal Problem:   Epistaxis Active Problems:   BRADYCARDIA-TACHYCARDIA SYNDROME   CONSTIPATION, CHRONIC   Hypothyroidism   Essential hypertension, benign   Sick sinus syndrome   Diastolic dysfunction   Epistaxis, recurrent   Severe epistaxis   Hematoma   PAF (paroxysmal atrial fibrillation)   Acute blood loss anemia   Hypomagnesemia   Chronic atrial fibrillation   Essential hypertension   Other specified hypothyroidism   Posterior epistaxis   Hypokalemia  SUBJECTIVE  Feels tired. Was dizzy in AM, better now. She stated that she would like to go back on verapamil. Feels palpitations. Denies CP or SOB.   CURRENT MEDS . amiodarone  50 mg Oral Daily  .  ceFAZolin (ANCEF) IV  1 g Intravenous Q12H  . diltiazem  60 mg Oral 4 times per day  . Glycerin (Adult)  1 suppository Rectal Once  . levothyroxine  50 mcg Oral QODAY   And  . levothyroxine  75 mcg Oral QODAY  . metoprolol succinate  50 mg Oral Daily  . polyethylene glycol  17 g Oral BID  . polyvinyl alcohol  1 drop Both Eyes Daily  . senna-docusate  1 tablet Oral BID  . silver sulfADIAZINE   Topical BID  . sodium chloride  3 mL Intravenous Q12H    OBJECTIVE  Filed Vitals:   07/19/14 0700 07/19/14 0735 07/19/14 0736 07/19/14 0737  BP:   119/68   Pulse:  88 123 95  Temp: 98.1 F (36.7 C)     TempSrc: Oral     Resp:  27 21 12   Height:      Weight:      SpO2:  99% 99% 100%    Intake/Output Summary (Last 24 hours) at 07/19/14 1216 Last data filed at 07/19/14 1025  Gross per 24 hour  Intake    120 ml  Output   1404 ml  Net  -1284 ml   Filed Weights   07/14/14 0513 07/16/14 2040  Weight: 120 lb (54.432 kg) 135 lb 5.8 oz (61.4 kg)    PHYSICAL EXAM  General: Pleasant, NAD. Neuro: Alert and oriented X 3. Moves all extremities spontaneously. Psych: Normal affect. HEENT:  Normal  Neck: Supple without bruits or JVD. Lungs:  Resp regular and  unlabored, CTA. Heart: irregular.  no s3, s4, or murmurs. Abdomen: Soft, non-tender, non-distended, BS + x 4.  Extremities: No clubbing, cyanosis or edema. DP/PT/Radials 2+ and equal bilaterally.  Accessory Clinical Findings  CBC  Recent Labs  07/16/14 1912  07/18/14 0352 07/19/14 0245  WBC 12.5*  < > 10.6* 9.4  NEUTROABS 8.7*  --   --  5.5  HGB 9.4*  < > 9.5* 9.1*  HCT 27.9*  < > 28.7* 27.7*  MCV 84.5  < > 86.7 87.4  PLT 148*  < > 183 234  < > = values in this interval not displayed. Basic Metabolic Panel  Recent Labs  07/18/14 0352 07/19/14 0245  NA 141 140  K 3.6 3.6  CL 106 104  CO2 27 27  GLUCOSE 111* 109*  BUN <5* 7  CREATININE 0.51 0.63  CALCIUM 8.2* 8.3*  MG 2.0 2.0   Liver Function Tests  Recent Labs  07/16/14 1912 07/19/14 0245  AST 35 22  ALT 16 13*  ALKPHOS 49 49  BILITOT 1.1 0.8  PROT 5.6* 5.4*  ALBUMIN 3.0* 2.7*   TELE  afib with rate of 90-100s.  At time elevates to 120-130s. Occasional PVCs.    ASSESSMENT AND PLAN    1. PAF with RVR - CHA2DS2-Vasc score at least 4 (female, HTN, age) - Likely will be off coumadin for 3-4 weeks due to epistaxis and hematoma evacuation. - Continue toprol XL 50mg  and home dose of amio 50mg  daily.   - Rate in 100s, at times elevated to 120s-130s. Somehow she hasn't received AM dose of dilt. Will increase diltiazem to 90mg  q6HR.  - She would like to go back on verapamil, however we will hold for now until stable.   2. L sided epistaxis - s/p endoscopic cauterization x 2 by ENT and maxillary artery occlusion by IR - of note 6/9, Dr. Simeon Craft, "risk of removing her absorbable packing FAR outweighs the sinus pressure and tearing symptoms". - Absorbable packing will dissolve on its own in 2-3 weeks  3. R groin hematoma s/p evacuation by vascular on 6/6 - U/S large hematoma, negative for AV fistula or pseudoanuerysm  4. HTN - Stable, continue current regimen.   5. Hypothyroidism 6. history of  tachybradycardia syndrome status post [employer] pacemaker  Signed, Bhagat,Bhavinkumar PA-C Pager [phone]   See separate progress note same day.

## 2014-07-19 NOTE — Clinical Social Work Note (Signed)
CSW spoke with PT about her recommendation for SNF placement. Per PT pt is doing well and does not need SNF placement. Pt will transition back home. CSW will sign off.   Garrett, MSW, [place]

## 2014-07-19 NOTE — Progress Notes (Signed)
Patient ID: Kathryn Gibson, female   DOB: 05-03-1933, 81 y.o.   MRN: [id number] Comfortable. Denies any right groin or leg pain. Incision and right groin is difficult. Skin looks quite good. Does not appear as though there will be a full-thickness loss. Palpable dorsalis pedis pulse on the right.  No vascular issues. We'll see her again next week if still in house. Please call over the weekend if there are vascular issues

## 2014-07-19 NOTE — Progress Notes (Signed)
[place] TEAM 1 - Stepdown/ICU TEAM Progress Note  Kathryn Gibson [id number]:[id number] DOB: January 25, 1934 DOA: 07/14/2014 PCP: Tivis Ringer, MD  Admit HPI / Brief Narrative: 81 y.o. F with a history of atrial fibrillation on chronic warfarin, hypothyroidism, and sick sinus syndrome status post pacemaker placement who presented to the ED with recurrent epistaxis. She was going to the movies with her girlfriends on 5/30 when she tripped over a newly constructed concrete barrier in the front of a parking space. Unfortunately she landed on her face. She went to an urgent care center who x-rayed her face and found no fracture. On 6/03 the patient developed epistaxis and was seen in [place]. She left there at 2 AM on 6/4 with nasal packing in place. At approximately 2 AM on 6/5 her nose started bleeding again and soaked through the packing. She presented to [facility] ER. She was evaluated by Dr. Simeon Craft (ENT) who took her to the OR and embolized her left nare. Unfortunately shortly after the procedure she started to bleed profusely again. She was taken to IR for superselective embolization of Lt IMAX and infraorbital branches and Rt IMAX ,Infraorbital and nasal branches of facial artery using PVA particles.  Her initial INR was 1.73.Marland Kitchen  HPI/Subjective: The patient continues to complain of significant drainage from the left eye and pressure in the left ear.  It has been explained to her again that this is a natural consequence of her necessary nasal packing.  She denies headache fevers chills nausea vomiting chest pain or shortness of breath.  Assessment/Plan:  Refractory epistaxis Care as per ENT - appears to have resolved s/p second trip to OR - nasal packing is absorbable over 2-3 week period - prophylactic antibiotics continues  R groin hemorrhage at puncture site Care as per Vascular Surgery - appears to be improving nicely  Acute blood loss anemia The patient's hemoglobin is holding  steady/increasing - she is status post 2 units packed for blood cells during the course of his hospital stay thus far - there is no evidence of ongoing blood loss at this time  Hypomagnesemia Magnesium presently at goal of 2.0  Chronic afib Heart rate better controlled today - transfer to telemetry bed - transition to long-acting medication/mimic home regimen  HTN Blood pressure well controlled at the present time  Hypothyroidism Continue home Synthroid dose  SSS s/p pacer   Chronic constipation On usual home regimen  Code Status: FULL Family Communication: No family present at time of visit today Disposition Plan: transfer to telemetry bed - probable discharge in next 24-48 hours if heart rate remains controlled with adjustment in medications  Consultants: Vascular surgery ENT  Antibiotics: Cefazolin 6/5 >  DVT prophylaxis: SCDs  Objective: Blood pressure 129/72, pulse 110, temperature 98.8 F (37.1 C), temperature source Oral, resp. rate 16, height 5\' 1"  (1.549 m), weight 61.4 kg (135 lb 5.8 oz), SpO2 100 %.  Intake/Output Summary (Last 24 hours) at 07/19/14 1515 Last data filed at 07/19/14 1025  Gross per 24 hour  Intake    120 ml  Output   1404 ml  Net  -1284 ml   Exam: General: No acute respiratory distress - alert and conversant HEENT: Nasal packing in place in left snare with no evidence of bleeding Lungs: Clear to auscultation bilaterally without wheezes or crackles Cardiovascular: Regular rate without murmur gallop or rub  Abdomen: Nontender, nondistended, soft, bowel sounds positive, no rebound, no ascites, no appreciable mass Extremities: No significant cyanosis,  clubbing, or edema bilateral lower extremities - right thigh/groin wound dressed and dry  Data Reviewed: Basic Metabolic Panel:  Recent Labs Lab 07/14/14 2034 07/15/14 0150 07/16/14 0225 07/16/14 1912 07/18/14 0352 07/19/14 0245  NA  --  142 137 137 141 140  K  --  3.9 3.4* 3.1* 3.6  3.6  CL  --  110 104 103 106 104  CO2  --  26 26 27 27 27   GLUCOSE  --  139* 116* 117* 111* 109*  BUN  --  9 7 5* <5* 7  CREATININE  --  0.70 0.64 0.59 0.51 0.63  CALCIUM  --  7.6* 7.8* 8.1* 8.2* 8.3*  MG 1.6*  --  1.8 1.9 2.0 2.0    CBC:  Recent Labs Lab 07/15/14 0150  07/16/14 0225 07/16/14 1912 07/17/14 1405 07/18/14 0352 07/19/14 0245  WBC 8.9  < > 11.6* 12.5* 12.4* 10.6* 9.4  NEUTROABS 7.0  --   --  8.7*  --   --  5.5  HGB 7.4*  < > 8.8* 9.4* 10.6* 9.5* 9.1*  HCT 22.2*  < > 25.7* 27.9* 31.8* 28.7* 27.7*  MCV 89.5  < > 83.4 84.5 85.0 86.7 87.4  PLT 171  < > 144* 148* 171 183 234  < > = values in this interval not displayed.  Liver Function Tests:  Recent Labs Lab 07/16/14 0225 07/16/14 1912 07/19/14 0245  AST 27 35 22  ALT 15 16 13*  ALKPHOS 43 49 49  BILITOT 0.8 1.1 0.8  PROT 4.9* 5.6* 5.4*  ALBUMIN 2.9* 3.0* 2.7*    Coags:  Recent Labs Lab 07/12/14 2111 07/14/14 0540 07/14/14 2034 07/15/14 0150  INR 2.09* 1.73* 1.58* 1.30    Recent Labs Lab 07/15/14 0150  APTT 25    Studies:   Recent x-ray studies have been reviewed in detail by the Attending Physician  Scheduled Meds:  Scheduled Meds: . amiodarone  50 mg Oral Daily  .  ceFAZolin (ANCEF) IV  1 g Intravenous Q12H  . diltiazem  90 mg Oral 4 times per day  . Glycerin (Adult)  1 suppository Rectal Once  . levothyroxine  50 mcg Oral QODAY   And  . levothyroxine  75 mcg Oral QODAY  . metoprolol succinate  50 mg Oral Daily  . polyethylene glycol  17 g Oral BID  . polyvinyl alcohol  1 drop Both Eyes Daily  . senna-docusate  1 tablet Oral BID  . silver sulfADIAZINE   Topical BID  . sodium chloride  3 mL Intravenous Q12H    Time spent on care of this patient: 25 mins   Shariah Assad T , MD   [facility] Office  [phone] Pager - Text Page per Shea Evans as per below:  On-Call/Text Page:      [email or web address]      password [email or web address]  If 7PM-7AM, please contact  night-coverage [email or web address] Password [email or web address] 07/19/2014, 3:15 PM   LOS: 5 days

## 2014-07-19 NOTE — Progress Notes (Signed)
Medicare Important Message given? YES (If response is "NO", the following Medicare IM given date fields will be blank) Date Medicare IM given:07/19/14 Medicare IM given by: Tymon Nemetz 

## 2014-07-19 NOTE — Progress Notes (Signed)
Patient to transfer to 2W09 report given to receiving nurse [facility] all questions answered at this time.  Pt. VSS with no s/s of distress noted.  Patient stable at transfer.

## 2014-07-20 MED ORDER — LORAZEPAM 0.5 MG PO TABS: 0.2500 mg | ORAL_TABLET | Freq: Three times a day (TID) | ORAL | Status: DC | PRN

## 2014-07-20 MED ORDER — OXYCODONE HCL 5 MG PO TABS: 5.0000 mg | ORAL_TABLET | ORAL | Status: DC | PRN

## 2014-07-20 MED ORDER — ACETAMINOPHEN-CODEINE #3 300-30 MG PO TABS: 1.0000 | ORAL_TABLET | ORAL | Status: DC | PRN

## 2014-07-20 MED ORDER — MORPHINE SULFATE 2 MG/ML IJ SOLN: 1.0000 mg | INTRAMUSCULAR | Status: DC | PRN

## 2014-07-20 MED ORDER — POTASSIUM CHLORIDE CRYS ER 20 MEQ PO TBCR
40.0000 meq | EXTENDED_RELEASE_TABLET | Freq: Once | ORAL | Status: AC
  Administered 2014-07-20: 40 meq via ORAL
  Filled 2014-07-20: qty 2

## 2014-07-20 MED ORDER — CEPHALEXIN 500 MG PO CAPS
500.0000 mg | ORAL_CAPSULE | Freq: Two times a day (BID) | ORAL | Status: DC
  Administered 2014-07-20 – 2014-07-22 (×5): 500 mg via ORAL
  Filled 2014-07-20 (×9): qty 1

## 2014-07-20 NOTE — Progress Notes (Signed)
[facility] TEAM 1 - Stepdown/ICU TEAM Progress Note  Kathryn Gibson TDD:[id number] DOB: Nov 12, 1933 DOA: 07/14/2014 PCP: Tivis Ringer, MD  Admit HPI / Brief Narrative: 81 y.o. F with a history of atrial fibrillation on chronic warfarin, hypothyroidism, and sick sinus syndrome status post pacemaker placement who presented to the ED with recurrent epistaxis. She was going to the movies with her girlfriends on 5/30 when she tripped over a newly constructed concrete barrier in the front of a parking space. Unfortunately she landed on her face. She went to an urgent care center who x-rayed her face and found no fracture. On 6/03 the patient developed epistaxis and was seen in [place]. She left there at 2 AM on 6/4 with nasal packing in place. At approximately 2 AM on 6/5 her nose started bleeding again and soaked through the packing. She presented to [facility] ER. She was evaluated by Dr. Simeon Craft (ENT) who took her to the OR and embolized her left nare. Unfortunately shortly after the procedure she started to bleed profusely again. She was taken to IR for superselective embolization of Lt IMAX and infraorbital branches and Rt IMAX ,Infraorbital and nasal branches of facial artery using PVA particles.  Her initial INR was 1.73.Marland Kitchen  HPI/Subjective: Pt is noting some minor blood staining of the fluid dripping from her L nare today.  She denies posterior bleeding, sob, n/v, abdom pain, or cp.  She ambulated in the hall this morning and her HR did not increase markedly.    Assessment/Plan:  Refractory epistaxis Care as per ENT - appears to have resolved s/p second trip to OR - nasal packing is absorbable over 2-3 week period - prophylactic antibiotics continues - change abx to oral today   R groin hemorrhage at puncture site Care as per Vascular Surgery - appears to be improving nicely - exam reveals soft thigh w/ no acute complications   Acute blood loss anemia The patient's hemoglobin is  variable but appears to be stable in general - she is status post 2 units packed for blood cells during the course of his hospital stay thus far - blood loss at L nare currently appears to be very minimal - cont to follow   Hypomagnesemia Magnesium presently at goal of 2.0  Chronic afib transitioned to long-acting medication/mimic home regimen - HR somewhat rapid this AM (116) but presently better controlled - follow trend   HTN Blood pressure well controlled at the present time  Hypothyroidism Continue home Synthroid dose  SSS s/p pacer   Chronic constipation On usual home regimen  Code Status: FULL Family Communication: daughter in law at bedside  Disposition Plan: transfer to telemetry bed - probable d/c Monday if HR controlled and Hgb stable - pt desires rehab level at [facility] to begin with, and I agree this is a good idea - PT to f/u   Consultants: Vascular surgery ENT  Antibiotics: Cefazolin 6/5 > 6/11 Keflex 6/11 >  DVT prophylaxis: SCDs  Objective: Blood pressure 128/79, pulse 116, temperature 98 F (36.7 C), temperature source Oral, resp. rate 20, height 5\' 1"  (1.549 m), weight 61.4 kg (135 lb 5.8 oz), SpO2 98 %.  Intake/Output Summary (Last 24 hours) at 07/20/14 1322 Last data filed at 07/20/14 1229  Gross per 24 hour  Intake    240 ml  Output   2050 ml  Net  -1810 ml   Exam: General: alert and conversant HEENT: Nasal packing in place in left nare - now  blood stained w/ some clotting evident at orifice, but no evidence of significant active bleeding  Lungs: Clear to auscultation bilaterally without wheezes or crackles Cardiovascular: Regular rate - irreg irreg rhythm - without murmur gallop or rub  Abdomen: Nontender, nondistended, soft, bowel sounds positive, no rebound, no ascites, no appreciable mass Extremities: No significant cyanosis, clubbing, or edema bilateral lower extremities - right thigh/groin wound dressed and dry and not tense   Data  Reviewed: Basic Metabolic Panel:  Recent Labs Lab 07/14/14 2034 07/15/14 0150 07/16/14 0225 07/16/14 1912 07/18/14 0352 07/19/14 0245  NA  --  142 137 137 141 140  K  --  3.9 3.4* 3.1* 3.6 3.6  CL  --  110 104 103 106 104  CO2  --  26 26 27 27 27   GLUCOSE  --  139* 116* 117* 111* 109*  BUN  --  9 7 5* <5* 7  CREATININE  --  0.70 0.64 0.59 0.51 0.63  CALCIUM  --  7.6* 7.8* 8.1* 8.2* 8.3*  MG 1.6*  --  1.8 1.9 2.0 2.0    CBC:  Recent Labs Lab 07/15/14 0150  07/16/14 0225 07/16/14 1912 07/17/14 1405 07/18/14 0352 07/19/14 0245  WBC 8.9  < > 11.6* 12.5* 12.4* 10.6* 9.4  NEUTROABS 7.0  --   --  8.7*  --   --  5.5  HGB 7.4*  < > 8.8* 9.4* 10.6* 9.5* 9.1*  HCT 22.2*  < > 25.7* 27.9* 31.8* 28.7* 27.7*  MCV 89.5  < > 83.4 84.5 85.0 86.7 87.4  PLT 171  < > 144* 148* 171 183 234  < > = values in this interval not displayed.  Liver Function Tests:  Recent Labs Lab 07/16/14 0225 07/16/14 1912 07/19/14 0245  AST 27 35 22  ALT 15 16 13*  ALKPHOS 43 49 49  BILITOT 0.8 1.1 0.8  PROT 4.9* 5.6* 5.4*  ALBUMIN 2.9* 3.0* 2.7*    Coags:  Recent Labs Lab 07/14/14 0540 07/14/14 2034 07/15/14 0150  INR 1.73* 1.58* 1.30    Recent Labs Lab 07/15/14 0150  APTT 25    Studies:   Recent x-ray studies have been reviewed in detail by the Attending Physician  Scheduled Meds:  Scheduled Meds: . amiodarone  50 mg Oral Daily  .  ceFAZolin (ANCEF) IV  1 g Intravenous Q12H  . diltiazem  360 mg Oral Daily  . Glycerin (Adult)  1 suppository Rectal Once  . levothyroxine  50 mcg Oral QODAY   And  . levothyroxine  75 mcg Oral QODAY  . metoprolol succinate  50 mg Oral Daily  . polyethylene glycol  17 g Oral BID  . polyvinyl alcohol  1 drop Both Eyes Daily  . senna-docusate  1 tablet Oral BID  . silver sulfADIAZINE   Topical BID  . sodium chloride  3 mL Intravenous Q12H    Time spent on care of this patient: 35 mins   Quitman Norberto T , MD   [facility]  [phone] Pager - Text Page per Shea Evans as per below:  On-Call/Text Page:      [email or web address]      password [email or web address]  If 7PM-7AM, please contact night-coverage [email or web address] Password [email or web address] 07/20/2014, 1:22 PM   LOS: 6 days

## 2014-07-21 LAB — CBC
HEMATOCRIT: 29.9 % — AB (ref 36.0–46.0)
HEMOGLOBIN: 9.7 g/dL — AB (ref 12.0–15.0)
MCH: 28.4 pg (ref 26.0–34.0)
MCHC: 32.4 g/dL (ref 30.0–36.0)
MCV: 87.4 fL (ref 78.0–100.0)
PLATELETS: 328 10*3/uL (ref 150–400)
RBC: 3.42 MIL/uL — AB (ref 3.87–5.11)
RDW: 16.2 % — ABNORMAL HIGH (ref 11.5–15.5)
WBC: 9.7 10*3/uL (ref 4.0–10.5)

## 2014-07-21 NOTE — Progress Notes (Signed)
[place] TEAM 1 - Stepdown/ICU TEAM Progress Note  Kathryn Gibson BSJ:[id number] DOB: [birth year] DOA: 07/14/2014 PCP: Tivis Ringer, MD  Admit HPI / Brief Narrative: 81 y.o. F with a history of atrial fibrillation on chronic warfarin, hypothyroidism, and sick sinus syndrome status post pacemaker placement who presented to the ED with recurrent epistaxis. She was going to the movies with her girlfriends on 5/30 when she tripped over a newly constructed concrete barrier in the front of a parking space. Unfortunately she landed on her face. She went to an urgent care center who x-rayed her face and found no fracture. On 6/03 the patient developed epistaxis and was seen in [place]. She left there at 2 AM on 6/4 with nasal packing in place. At approximately 2 AM on 6/5 her nose started bleeding again and soaked through the packing. She presented to [facility] ER. She was evaluated by Dr. Simeon Craft (ENT) who took her to the OR and embolized her left nare. Unfortunately shortly after the procedure she started to bleed profusely again. She was taken to IR for superselective embolization of Lt IMAX and infraorbital branches and Rt IMAX ,Infraorbital and nasal branches of facial artery using PVA particles.  Her initial INR was 1.73.Marland Kitchen  HPI/Subjective: No new complaints today.  Has ambulated in the hall 2 times today.  Reports that she continued to feel weak in general.  Denies chest pain nausea vomiting or abdominal pain.    Assessment/Plan:  Refractory epistaxis Care as per ENT - appears to have resolved s/p second trip to OR - nasal packing is absorbable over 2-3 week period - prophylactic antibiotic continues - changed abx to oral    R groin hemorrhage at puncture site Care as per Vascular Surgery - appears to be improving nicely   Acute blood loss anemia The patient's hemoglobin is climbing - she is status post 2 units packed for blood cells during the course of his hospital stay thus far -  blood loss at L nare currently appears to be very minimal - cont to follow   Hypomagnesemia Magnesium presently at goal of 2.0  Chronic afib Patient now on oral medication only - heart rate presently well controlled - follow trend   HTN Blood pressure well controlled at the present time  Hypothyroidism Continue home Synthroid dose  SSS s/p pacer   Chronic constipation On usual home regimen  Code Status: FULL Family Communication: No family present at time of exam today  Disposition Plan: probable d/c Monday if HR controlled and Hgb stable - pt desires rehab level at [facility] to begin with and I agree this is a good idea - PT to f/u   Consultants: Vascular surgery ENT  Antibiotics: Cefazolin 6/5 > 6/11 Keflex 6/11 >  DVT prophylaxis: SCDs  Objective: Blood pressure 118/52, pulse 88, temperature 98.1 F (36.7 C), temperature source Oral, resp. rate 18, height 5\' 1"  (1.549 m), weight 61.4 kg (135 lb 5.8 oz), SpO2 98 %.  Intake/Output Summary (Last 24 hours) at 07/21/14 1645 Last data filed at 07/21/14 1500  Gross per 24 hour  Intake    735 ml  Output   2650 ml  Net  -1915 ml   Exam: General: alert and conversant HEENT: Nasal packing in place in left nare blood stained w/ some clotting evident at orifice, but no evidence of active bleeding  Lungs: Clear to auscultation bilaterally without wheezes / crackles Cardiovascular: Regular rate - irreg irreg rhythm - without murmur gallop  or rub  Abdomen: Nontender, nondistended, soft, bowel sounds positive, no rebound, no ascites, no appreciable mass Extremities: No significant cyanosis, clubbing, or edema bilateral lower extremities  Data Reviewed: Basic Metabolic Panel:  Recent Labs Lab 07/14/14 2034 07/15/14 0150 07/16/14 0225 07/16/14 1912 07/18/14 0352 07/19/14 0245  NA  --  142 137 137 141 140  K  --  3.9 3.4* 3.1* 3.6 3.6  CL  --  110 104 103 106 104  CO2  --  26 26 27 27 27   GLUCOSE  --  139* 116*  117* 111* 109*  BUN  --  9 7 5* <5* 7  CREATININE  --  0.70 0.64 0.59 0.51 0.63  CALCIUM  --  7.6* 7.8* 8.1* 8.2* 8.3*  MG 1.6*  --  1.8 1.9 2.0 2.0    CBC:  Recent Labs Lab 07/15/14 0150  07/16/14 1912 07/17/14 1405 07/18/14 0352 07/19/14 0245 07/21/14 0440  WBC 8.9  < > 12.5* 12.4* 10.6* 9.4 9.7  NEUTROABS 7.0  --  8.7*  --   --  5.5  --   HGB 7.4*  < > 9.4* 10.6* 9.5* 9.1* 9.7*  HCT 22.2*  < > 27.9* 31.8* 28.7* 27.7* 29.9*  MCV 89.5  < > 84.5 85.0 86.7 87.4 87.4  PLT 171  < > 148* 171 183 234 328  < > = values in this interval not displayed.  Liver Function Tests:  Recent Labs Lab 07/16/14 0225 07/16/14 1912 07/19/14 0245  AST 27 35 22  ALT 15 16 13*  ALKPHOS 43 49 49  BILITOT 0.8 1.1 0.8  PROT 4.9* 5.6* 5.4*  ALBUMIN 2.9* 3.0* 2.7*    Coags:  Recent Labs Lab 07/14/14 2034 07/15/14 0150  INR 1.58* 1.30    Recent Labs Lab 07/15/14 0150  APTT 25    Studies:   Recent x-ray studies have been reviewed in detail by the Attending Physician  Scheduled Meds:  Scheduled Meds: . amiodarone  50 mg Oral Daily  . cephALEXin  500 mg Oral Q12H  . diltiazem  360 mg Oral Daily  . Glycerin (Adult)  1 suppository Rectal Once  . levothyroxine  50 mcg Oral QODAY   And  . levothyroxine  75 mcg Oral QODAY  . metoprolol succinate  50 mg Oral Daily  . polyethylene glycol  17 g Oral BID  . polyvinyl alcohol  1 drop Both Eyes Daily  . senna-docusate  1 tablet Oral BID  . silver sulfADIAZINE   Topical BID  . sodium chloride  3 mL Intravenous Q12H    Time spent on care of this patient: 25 mins   Bryse Blanchette T , MD   [facility] Office  [phone] Pager - Text Page per Shea Evans as per below:  On-Call/Text Page:      [email or web address]      password [email or web address]  If 7PM-7AM, please contact night-coverage [email or web address] Password [email or web address] 07/21/2014, 4:45 PM   LOS: 7 days

## 2014-07-21 NOTE — Progress Notes (Signed)
Patient ID: Kathryn Gibson, female   DOB: 05-08-1933, 81 y.o.   MRN: [id number] Comfortable. Possibly for discharge tomorrow The patient was concern regarding fullness in her right thigh. Continues to heal. Extensive bruising as anticipated. Some thickness at the incision as this is healing with no evidence of reaccumulation of more hematoma. Fortunately skin continues to look quite good with no evidence of significant full-thickness loss.

## 2014-07-21 NOTE — Progress Notes (Signed)
Pt ambulated 600 feet with the rolling walker. Pt tolerated well no s/s of distress. Pt c/o of nasal packing irritating her, but no s/s of bleeding.   Will continue to monitor.   Earlie Lou

## 2014-07-22 ENCOUNTER — Telehealth: Payer: Self-pay | Admitting: Vascular Surgery

## 2014-07-22 LAB — CBC
HEMATOCRIT: 30.5 % — AB (ref 36.0–46.0)
Hemoglobin: 9.9 g/dL — ABNORMAL LOW (ref 12.0–15.0)
MCH: 28.4 pg (ref 26.0–34.0)
MCHC: 32.5 g/dL (ref 30.0–36.0)
MCV: 87.4 fL (ref 78.0–100.0)
PLATELETS: 353 10*3/uL (ref 150–400)
RBC: 3.49 MIL/uL — AB (ref 3.87–5.11)
RDW: 16.1 % — ABNORMAL HIGH (ref 11.5–15.5)
WBC: 10.4 10*3/uL (ref 4.0–10.5)

## 2014-07-22 MED ORDER — SILVER SULFADIAZINE 1 % EX CREA: TOPICAL_CREAM | Freq: Two times a day (BID) | CUTANEOUS | Status: DC

## 2014-07-22 MED ORDER — CEPHALEXIN 500 MG PO CAPS: 500.0000 mg | ORAL_CAPSULE | Freq: Two times a day (BID) | ORAL | Status: DC

## 2014-07-22 MED ORDER — DILTIAZEM HCL ER COATED BEADS 360 MG PO CP24: 360.0000 mg | ORAL_CAPSULE | Freq: Every day | ORAL | Status: DC

## 2014-07-22 NOTE — Progress Notes (Signed)
CSW contacted [facility] for SNF placement- per [facility] pt is not appropriate for SNF placement but they could accept the pt into their ALF facility for respite care- pt is agreeable.  Patient will discharge to [facility] Anticipated discharge date: 07/22/14 Family notified: pt called family for ride Transportation by family- pt anticipates that family will arrive at [place] signing off.  Domenica Reamer, [place] Social Worker [phone]

## 2014-07-22 NOTE — Clinical Social Work Note (Signed)
Clinical Social Work Assessment  Patient Details  Name: Kathryn Gibson MRN: [id number] Date of Birth: December 01, 1933  Date of referral:  07/22/14               Reason for consult:  Facility Placement                Permission sought to share information with:  Facility Sport and exercise psychologist Permission granted to share information::  Yes, Verbal Permission Granted  Name::     Rosendo Gros  Agency::  [facility]  Relationship::     Contact Information:  [phone]  Housing/Transportation Living arrangements for the past 2 months:  [place] of Information:  Patient Patient Interpreter Needed:  None Criminal Activity/Legal Involvement Pertinent to Current Situation/Hospitalization:  No - Comment as needed Significant Relationships:  Adult Children Lives with:  Facility Resident, Self Do you feel safe going back to the place where you live?  Yes Need for family participation in patient care:  No (Coment)  Care giving concerns:  Pt lives alone in independent living portion at [facility]   Social Worker assessment / plan: CSW spoke with pt about Dr concerns about pt returning to living alone/ pt had expressed desire to go to SNF portion of [facility].   Employment status:  Retired Nurse, adult PT Recommendations:  [place] / Referral to community resources:  [place]  Patient/Family's Response to care:  Pt had requested that she be transferred to SNF portion at [facility] for a week so she could become more stable prior to returning back to [place] spoke with [facility] who does not think the pt sounds appropriate for SNF portion and wishes to speak to the pt about going to ALF while the pt recovers.  Patient/Family's Understanding of and Emotional Response to Diagnosis, Current Treatment, and Prognosis: unclear- pt did not  express any concerns at this time  Emotional Assessment Appearance:  Appears stated age Attitude/Demeanor/Rapport:    Affect (typically observed):  Appropriate, Anxious, Pleasant Orientation:  Oriented to Self, Oriented to Place, Oriented to  Time, Oriented to Situation Alcohol / Substance use:  Not Applicable Psych involvement (Current and /or in the community):  No (Comment)  Discharge Needs  Concerns to be addressed:  Discharge Planning Concerns Readmission within the last 30 days:  No Current discharge risk:  Lives alone, Physical Impairment Barriers to Discharge:  No Barriers Identified   Cranford Mon, LCSW 07/22/2014, 12:00 PM

## 2014-07-22 NOTE — Progress Notes (Signed)
  [facility] Progress Note  Subjective  - POD #7  Denies any pain with right groin. Having some numbness there. This is not new. Has been present since before surgery.   Objective Filed Vitals:   07/22/14 0431  BP: 120/65  Pulse: 84  Temp: 97.6 F (36.4 C)  Resp: 18    Intake/Output Summary (Last 24 hours) at 07/22/14 0931 Last data filed at 07/22/14 0856  Gross per 24 hour  Intake    998 ml  Output   2200 ml  Net  -1202 ml    Right groin with ecchymosis. Tissue swelling at right groin from prior hematoma. No new hematoma. Incision clean and intact. Skin without breakdown. Healing well.   Assessment/Planning: 81 y.o. female is s/p: Evacuation of right medial thigh hematoma 7 Days Post-Op   Skin is healing well without evidence of breakdown.  Numbness likely due to compression of nerves with prior hematoma.  For possible d/c today. F/u with Dr. Donnetta Hutching in 2 weeks.   Alvia Grove 07/22/2014 9:31 AM --  Laboratory CBC    Component Value Date/Time   WBC 10.4 07/22/2014 0343   HGB 9.9* 07/22/2014 0343   HCT 30.5* 07/22/2014 0343   PLT 353 07/22/2014 0343    BMET    Component Value Date/Time   NA 140 07/19/2014 0245   K 3.6 07/19/2014 0245   CL 104 07/19/2014 0245   CO2 27 07/19/2014 0245   GLUCOSE 109* 07/19/2014 0245   BUN 7 07/19/2014 0245   CREATININE 0.63 07/19/2014 0245   CALCIUM 8.3* 07/19/2014 0245   GFRNONAA >60 07/19/2014 0245   GFRAA >60 07/19/2014 0245    COAG Lab Results  Component Value Date   INR 1.30 07/15/2014   INR 1.58* 07/14/2014   INR 1.73* 07/14/2014   No results found for: PTT  Antibiotics Anti-infectives    Start     Dose/Rate Route Frequency Ordered Stop   07/20/14 1500  cephALEXin (KEFLEX) capsule 500 mg     500 mg Oral Every 12 hours 07/20/14 1406     07/15/14 0000  ceFAZolin (ANCEF) IVPB 1 g/50 mL premix  Status:  Discontinued     1 g 100 mL/hr over 30 Minutes Intravenous Every 12 hours 07/14/14  1605 07/20/14 1406   07/14/14 0900  ceFAZolin (ANCEF) IVPB 1 g/50 mL premix    Comments:  In operating room   1 g 100 mL/hr over 30 Minutes Intravenous  Once 07/14/14 0900 07/14/14 1010       Virgina Jock, PA-C [facility] Office: [phone] Pager: [phone] 07/22/2014 9:31 AM

## 2014-07-22 NOTE — Discharge Summary (Signed)
DISCHARGE SUMMARY  Kathryn Gibson  MR#: [id number]  DOB:[birth year]  Date of Admission: 07/14/2014 Date of Discharge: 07/22/2014  Attending Physician:Mayleen Borrero T  Patient's XHB:ZJIR,CVELFYBOFB R, MD  Consults: ENT - [place] [facility] - [facility]   Disposition: D/C to ALF at [facility] w/ PT/OT assistance   Follow-up Appts:     Follow-up Information    Follow up with Curt Jews, MD In 2 weeks.   Specialties:  Vascular Surgery, Cardiology   Why:  Our office will call you to arrange an appointment (sent)   Contact information:   [place] [address] [phone]       Schedule an appointment as soon as possible for a visit with Ruby Cola, MD.   Specialty:  Otolaryngology   Why:  As needed   Contact information:   [address] [place] [address] [place] [address] [phone]       Follow up with Warren Danes, MD. Schedule an appointment as soon as possible for a visit in 2 weeks.   Specialty:  Cardiology   Contact information:   [address] [place] [address] [phone]      Tests Needing Follow-up: -consideration should be given to resuming warfarin anticoag for Afib and ASA ~2 weeks post d/c  -R thigh wound and L nare should be followed w/ serial exam  Wound Care: -nasal packing should not be manipulated and will dissolve on its on per ENT -R thigh wound should be kept clean and dry - silvadene should be applied to wound BID  Discharge Diagnoses: Refractory epistaxis  R groin hemorrhage at puncture site Acute blood loss anemia Hypomagnesemia Chronic afib  HTN Hypothyroidism SSS s/p pacer  Chronic constipation  Initial presentation: 81 y.o. F with a history of atrial fibrillation on chronic warfarin, hypothyroidism, and sick sinus syndrome status post pacemaker placement who presented to the ED with recurrent epistaxis. She was going to the movies with her girlfriends on 5/30 when she tripped  over a newly constructed concrete barrier in the front of a parking space. Unfortunately she landed on her face. She went to an urgent care center who x-rayed her face and found no fracture. On 6/03 the patient developed epistaxis and was seen in [place]. She left there at 2 AM on 6/4 with nasal packing in place. At approximately 2 AM on 6/5 her nose started bleeding again and soaked through the packing. She presented to [facility] ER. She was evaluated by Dr. Simeon Craft (ENT) who took her to the OR and embolized her left nare. Unfortunately shortly after the procedure she started to bleed profusely again. She was taken to IR for superselective embolization of Lt IMAX and infraorbital branches and Rt IMAX ,Infraorbital and nasal branches of facial artery using PVA particles. Her initial INR was 1.73.Marland Kitchen  Hospital Course:  Refractory epistaxis Care as per ENT - appears to have resolved s/p second trip to OR - nasal packing is absorbable over 2-3 week period per ENT - prophylactic antibiotic continues - changed abx to oral to complete another week of tx - blood loss at L nare currently appears to be very minimal though pt is developing a significant amount of clotted blood and packing at nare orifice - this is troublesome to the pt but should not be disturbed and allowed to dissolve/loosen on its onas per ENT  R groin hemorrhage at puncture site Improving nicely as per Vascular Surgery - no apparent complications  Acute blood loss anemia The patient's hemoglobin is climbing at the time of her d/c - she is status post 2 units packed for blood cells during the course of his hospital stay  Hypomagnesemia Magnesium presently at goal of 2.0  Chronic afib heart rate presently well controlled - follow trend   HTN Blood pressure well controlled at the present time  Hypothyroidism Continue home Synthroid dose  SSS s/p pacer   Chronic constipation On usual home regimen    Medication List      STOP taking these medications        aspirin 81 MG tablet     verapamil 240 MG CR tablet  Commonly known as:  CALAN-SR     warfarin 5 MG tablet  Commonly known as:  COUMADIN      TAKE these medications        acetaminophen 500 MG tablet  Commonly known as:  TYLENOL  Take 500-1,000 mg by mouth every 6 (six) hours as needed for moderate pain or headache.     ALIGN PO  Take 1 capsule by mouth daily.     amiodarone 100 MG tablet  Commonly known as:  PACERONE  Take 50 mg by mouth daily.     CALTRATE 600 PLUS-VIT D PO  Take 1 tablet by mouth daily.     cephALEXin 500 MG capsule  Commonly known as:  KEFLEX  Take 1 capsule (500 mg total) by mouth every 12 (twelve) hours.     cholecalciferol 1000 UNITS tablet  Commonly known as:  VITAMIN D  Take 1,000 Units by mouth daily.     diltiazem 360 MG 24 hr capsule  Commonly known as:  CARDIZEM CD  Take 1 capsule (360 mg total) by mouth daily.     Fish Oil 1200 MG Caps  Take 1 capsule by mouth daily. Mega Red.     levothyroxine 50 MCG tablet  Commonly known as:  SYNTHROID, LEVOTHROID  Take 18mcg by mouth every other day and take 75 mcg by mouth on alternate days     LYSINE PO  Take 1-2 tablets by mouth daily as needed (fever blister).     metoprolol succinate 50 MG 24 hr tablet  Commonly known as:  TOPROL-XL  Take 1 tablet (50 mg total) by mouth daily.     MIRALAX powder  Generic drug:  polyethylene glycol powder  Take two capfuls by mouth at bedtime     multivitamin capsule  Take 1 capsule by mouth daily.     silver sulfADIAZINE 1 % cream  Commonly known as:  SILVADENE  Apply topically 2 (two) times daily.     SYSTANE OP  Apply 1-2 drops to eye daily.     temazepam 30 MG capsule  Commonly known as:  RESTORIL  Take 30 mg by mouth at bedtime.     vitamin C 1000 MG tablet  Take 1,000 mg by mouth daily.       Day of Discharge BP 120/65 mmHg  Pulse 84  Temp(Src) 97.6 F (36.4 C) (Oral)  Resp 18  Ht 5\' 1"   (1.549 m)  Wt 61.4 kg (135 lb 5.8 oz)  BMI 25.59 kg/m2  SpO2 98%  Physical Exam: General: alert and conversant HEENT: Nasal packing in place in left nare blood stained w/ some clotting evident at orifice, but no evidence of active bleeding  Lungs: Clear to auscultation bilaterally without wheezes / crackles Cardiovascular: Regular rate - irreg irreg rhythm - no murmur  gallop or rub  Abdomen: Nontender, nondistended, soft, bowel sounds positive, no rebound, no ascites, no appreciable mass Extremities: No significant cyanosis, clubbing, edema bilateral lower extremities Cutaneous:  R groin ecchymosis - no palpable hematoma   Basic Metabolic Panel:  Recent Labs Lab 07/16/14 0225 07/16/14 1912 07/18/14 0352 07/19/14 0245  NA 137 137 141 140  K 3.4* 3.1* 3.6 3.6  CL 104 103 106 104  CO2 26 27 27 27   GLUCOSE 116* 117* 111* 109*  BUN 7 5* <5* 7  CREATININE 0.64 0.59 0.51 0.63  CALCIUM 7.8* 8.1* 8.2* 8.3*  MG 1.8 1.9 2.0 2.0    Liver Function Tests:  Recent Labs Lab 07/16/14 0225 07/16/14 1912 07/19/14 0245  AST 27 35 22  ALT 15 16 13*  ALKPHOS 43 49 49  BILITOT 0.8 1.1 0.8  PROT 4.9* 5.6* 5.4*  ALBUMIN 2.9* 3.0* 2.7*   CBC:  Recent Labs Lab 07/16/14 1912 07/17/14 1405 07/18/14 0352 07/19/14 0245 07/21/14 0440 07/22/14 0343  WBC 12.5* 12.4* 10.6* 9.4 9.7 10.4  NEUTROABS 8.7*  --   --  5.5  --   --   HGB 9.4* 10.6* 9.5* 9.1* 9.7* 9.9*  HCT 27.9* 31.8* 28.7* 27.7* 29.9* 30.5*  MCV 84.5 85.0 86.7 87.4 87.4 87.4  PLT 148* 171 183 234 328 353    Recent Results (from the past 240 hour(s))  MRSA PCR Screening     Status: None   Collection Time: 07/16/14  9:00 PM  Result Value Ref Range Status   MRSA by PCR NEGATIVE NEGATIVE Final    Comment:        The GeneXpert MRSA Assay (FDA approved for NASAL specimens only), is one component of a comprehensive MRSA colonization surveillance program. It is not intended to diagnose MRSA infection nor to guide  or monitor treatment for MRSA infections.       Time spent in discharge (includes decision making & examination of pt): >30 minutes  07/22/2014, 1:36 PM   Cherene Altes, MD [facility] Office  [phone] Pager [phone]  On-Call/Text Page:      [email or web address]      password [facility]

## 2014-07-22 NOTE — Discharge Instructions (Signed)

## 2014-07-22 NOTE — Telephone Encounter (Signed)
-----   Message from Mena Goes, RN sent at 07/22/2014  9:42 AM EDT ----- Regarding: Schedule   ----- Message -----    From: Alvia Grove, PA-C    Sent: 07/22/2014   9:35 AM      To: Vvs Charge Pool  S/p evacuation of right medial thigh hematoma 07/15/14  F/u with Dr. Donnetta Hutching in 2 weeks  Thanks Maudie Mercury

## 2014-07-22 NOTE — Telephone Encounter (Signed)
Tried to reach [place] at [place], had to leave a message regarding appt, dpm

## 2014-07-22 NOTE — Care Management Note (Addendum)
Case Management Note  Patient Details  Name: Kathryn Gibson MRN: [id number] Date of Birth: 12-08-1933  Subjective/Objective:     Pt adm on 07/14/14 with epistaxis.  PTA, pt resided at [place].                 Action/Plan: Will follow for discharge needs as pt progresses.  May benefit from PT evaluation prior to dc.    Expected Discharge Date:   (unknown)               Expected Discharge Plan:  Home/Self Care  In-House Referral:     Discharge planning Services  CM Consult, SW Consult  Post Acute Care Choice:    Choice offered to:     DME Arranged:    DME Agency:     HH Arranged:    [place] Agency:     Status of Service:  Complete, will sign off Medicare Important Message Given:   Yes Date Medicare IM Given:   07/22/14 Medicare IM give by:   Elenor Quinones Date Additional Medicare IM Given:    Additional Medicare Important Message give by:     If discussed at [place] of Stay Meetings, dates discussed:    Disposition Plan:  [place]  Additional Comments:  Pt will discharge to [facility] per SW. NO CM needs  Reinaldo Raddle, RN, BSN  Trauma/Neuro ICU Case Manager [phone]

## 2014-07-22 NOTE — Progress Notes (Signed)
Physical Therapy Treatment Patient Details Name: Kathryn Gibson MRN: [id number] DOB: Jul 19, 1933 Today's Date: 07/22/2014    History of Present Illness This is a 81 year old who presented with recurrent left severe refractory Coumadin-related epistaxis s/p Left endoscopic control of epistaxis/nasal endoscopy with cauterization and evac of hematoma.    PT Comments    Pt making good progress.  Plan is to go to [facility] before returning to her Independent living apartment at [facility].  Follow Up Recommendations  SNF     Equipment Recommendations  None recommended by PT    Recommendations for Other Services       Precautions / Restrictions Precautions Precautions: Fall Restrictions Weight Bearing Restrictions: No    Mobility  Bed Mobility               General bed mobility comments: up in chair upon arrival  Transfers Overall transfer level: Modified independent                  Ambulation/Gait Ambulation/Gait assistance: Min guard Ambulation Distance (Feet): 550 Feet Assistive device: None Gait Pattern/deviations: Step-through pattern Gait velocity: decreased   General Gait Details: some unsteadiness noted and with decreased gait speed with slight guarded gait   Stairs            Wheelchair Mobility    Modified Rankin (Stroke Patients Only)       Balance     Sitting balance-Leahy Scale: Good     Standing balance support: No upper extremity supported Standing balance-Leahy Scale: Good                      Cognition Arousal/Alertness: Awake/alert Behavior During Therapy: WFL for tasks assessed/performed Overall Cognitive Status: Within Functional Limits for tasks assessed                      Exercises General Exercises - Lower Extremity Hip Flexion/Marching: Standing;10 reps Toe Raises: Both;10 reps;Standing Heel Raises: Both;10 reps;Standing Mini-Sqauts: Strengthening;Both;10 reps;Standing     General Comments        Pertinent Vitals/Pain Pain Assessment: Faces Faces Pain Scale: Hurts little more Pain Location: headache Pain Descriptors / Indicators: Heaviness Pain Intervention(s): Patient requesting pain meds-RN notified    Home Living                      Prior Function            PT Goals (current goals can now be found in the care plan section) Acute Rehab PT Goals Patient Stated Goal: back to my active lifestyle PT Goal Formulation: With patient Time For Goal Achievement: 07/29/14 Potential to Achieve Goals: Good Progress towards PT goals: Progressing toward goals    Frequency  Min 3X/week    PT Plan Current plan remains appropriate    Co-evaluation             End of Session Equipment Utilized During Treatment: Gait belt Activity Tolerance: Patient tolerated treatment well Patient left: in chair;with call bell/phone within reach     Time: 1046-1103 PT Time Calculation (min) (ACUTE ONLY): 17 min  Charges:  $Gait Training: 8-22 mins                    G Codes:      Edge Mauger LUBECK 07/22/2014, 11:20 AM

## 2014-07-23 ENCOUNTER — Non-Acute Institutional Stay: Payer: Medicare Other | Admitting: Internal Medicine

## 2014-07-23 ENCOUNTER — Encounter: Payer: Self-pay | Admitting: Internal Medicine

## 2014-07-23 DIAGNOSIS — R410 Disorientation, unspecified: Secondary | ICD-10-CM

## 2014-07-23 DIAGNOSIS — I480 Paroxysmal atrial fibrillation: Secondary | ICD-10-CM | POA: Diagnosis not present

## 2014-07-23 DIAGNOSIS — K121 Other forms of stomatitis: Secondary | ICD-10-CM

## 2014-07-23 DIAGNOSIS — Z5181 Encounter for therapeutic drug level monitoring: Secondary | ICD-10-CM | POA: Diagnosis not present

## 2014-07-23 DIAGNOSIS — Z7901 Long term (current) use of anticoagulants: Secondary | ICD-10-CM

## 2014-07-23 DIAGNOSIS — E038 Other specified hypothyroidism: Secondary | ICD-10-CM

## 2014-07-23 DIAGNOSIS — D62 Acute posthemorrhagic anemia: Secondary | ICD-10-CM | POA: Diagnosis not present

## 2014-07-23 DIAGNOSIS — R040 Epistaxis: Secondary | ICD-10-CM

## 2014-07-23 DIAGNOSIS — E876 Hypokalemia: Secondary | ICD-10-CM | POA: Diagnosis not present

## 2014-07-23 NOTE — Progress Notes (Signed)
Patient ID: Kath[facility], female   DOB: 1933/06/26, 81 y.o.   MRN: [id number]  Provider:  Rexene Edison. Mariea Clonts, D.O., C.M.D. Location:  [facility]  PCP: Tivis Ringer, MD  Code Status: DNR Goals of Care: Advanced Directive information Does patient have an advance directive?: Yes, Type of Advance Directive: [place], Does patient want to make changes to advanced directive?: No - Patient declined   Chief Complaint  Patient presents with  . New Admit To SNF    actually ALF s/p hospitalization for fall with epistaxis and right groin bleed at puncture site    HPI: 81 y.o. female who is very active and independent at [facility] with h/o pafib and sick sinus syndrome s/p pacemaker, also on antiarrhythmic, rate control and coumadin therapy with INR goal 2-3, htn, colon polyp, diverticulosis and irritable bowel was admitted to AL for short term respite and PT, OT s/p hospitalization with recurrent epistaxis.  She had sustained a fall on concrete landing on her face.  She was found not to have any acute injuries from this. She was seen in urgent care.  However, a few days later, she started with her epistaxis--first went to [facility] for packing and finally [facility] where she wound up getting admitted due to persistent epistaxis.  At admission, her INR was 1.7 and her coumadin was held.  She required a 2 unit prbc transfusion at one point.  She underwent initial left endocsopic ligation on inside her left nare by Dr. Simeon Craft, however, continued to have bleeding that became more severe felt to be posterior that required selective embolization by interventional radiology.   The epistaxis continued and she wound up having to have another procedure and have dissolvable packing put in the left nostril.  She still has this at present and it appears saturated with blood and is coming out of her left nostril despite her best attempts not to mess with it.   Unfortunately, per patient, a  blood vessel was nicked in her right groin and she wound up with a baseball sized hematoma. The hematoma required surgical evacuation due to skin damage.  This was performed 6/6 by Dr. Donnetta Hutching.  She still has swelling and bruising in the right groin.  She is getting the dressing there changed daily and silvadene applied.  She denies pain in the groin.    Her biggest concern today was pain in her mouth.  She has a dry mouth to begin with and is very uncomfortable eating.  She has an ulceration on the roof of her mouth from intubation.  She has been using lozenges and avoiding hard foods.  Drinking more liquids.    She also says she's tried increased the potassium in her diet bc she was hypokalemic.    She is frustrated that this ordeal has been going on for 8 days now.  She sees Dr. Simeon Craft again this afternoon.  She says she feels confused but was fully alert and oriented while speaking with me.  The discharge summary suggests considering restarting coumadin after 2 weeks.    ROS: Review of Systems  Constitutional: Positive for weight loss and malaise/fatigue. Negative for fever and chills.  HENT: Positive for nosebleeds. Negative for congestion and hearing loss.   Eyes: Negative for blurred vision.  Respiratory: Negative for shortness of breath.   Cardiovascular: Negative for chest pain.  Gastrointestinal: Negative for abdominal pain, constipation, blood in stool and melena.  Genitourinary: Negative for dysuria, urgency  and frequency.  Musculoskeletal: Positive for falls. Negative for myalgias, back pain, joint pain and neck pain.  Skin: Negative for rash.  Neurological: Positive for weakness. Negative for dizziness and headaches.  Psychiatric/Behavioral: Negative for depression and memory loss.       Says she feels confused, but seems oriented, did misplace her tv remote today    Past Medical History  Diagnosis Date  . Atrial fibrillation   . Hypertension   . Hypothyroidism   .  Irritable bowel syndrome (IBS)   . Constipation   . Colonic polyp     adenomatous  . Diverticulosis   . Bradycardia     s/p PPM  . Hemorrhoids   . Arthritis   . Cataract    Past Surgical History  Procedure Laterality Date  . Pacemaker insertion  2010    implanted by Dr Doreatha Lew ([facility])  . Colonoscopy    . Nasal endoscopy with epistaxis control N/A 07/14/2014    Procedure: NASAL ENDOSCOPY WITH EPISTAXIS CONTROL;  Surgeon: Ruby Cola, MD;  Location: [facility] OR;  Service: ENT;  Laterality: N/A;  . Radiology with anesthesia N/A 07/14/2014    Procedure: RADIOLOGY WITH ANESTHESIA;  Surgeon: Medication Radiologist, MD;  Location: [facility];  Service: Radiology;  Laterality: N/A;  . Hematoma evacuation Right 07/15/2014    Procedure: EVACUATION Right Groin Hematoma;  Surgeon: Rosetta Posner, MD;  Location: [facility];  Service: Vascular;  Laterality: Right;  . Nasal endoscopy with epistaxis control Bilateral 07/15/2014    Procedure: NASAL ENDOSCOPY WITH EPISTAXIS CONTROL;  Surgeon: Ruby Cola, MD;  Location: [facility] OR;  Service: ENT;  Laterality: Bilateral;   Social History:   reports that she quit smoking about 34 years ago. Her smoking use included Cigarettes. She has never used smokeless tobacco. She reports that she drinks about 8.4 oz of alcohol per week. She reports that she does not use illicit drugs.  Family History  Problem Relation Age of Onset  . Heart failure Mother   . Heart attack Father   . Colon cancer Neg Hx   . Diabetes Paternal Grandfather   . Kidney disease Neg Hx   . Liver disease Neg Hx   . Esophageal cancer Neg Hx   . Heart disease Brother   . Lung cancer Son     No Known Allergies  Medications: Patient's Medications  New Prescriptions   No medications on file  Previous Medications   ACETAMINOPHEN (TYLENOL) 500 MG TABLET    Take 500-1,000 mg by mouth every 6 (six) hours as needed for moderate pain or headache.   AMIODARONE (PACERONE) 100 MG TABLET    Take 50 mg by mouth daily.     ASCORBIC ACID (VITAMIN C) 1000 MG TABLET    Take 1,000 mg by mouth daily.     CALCIUM-VITAMIN D (CALTRATE 600 PLUS-VIT D PO)    Take 1 tablet by mouth daily.   CEPHALEXIN (KEFLEX) 500 MG CAPSULE    Take 1 capsule (500 mg total) by mouth every 12 (twelve) hours.   CHOLECALCIFEROL (VITAMIN D) 1000 UNITS TABLET    Take 1,000 Units by mouth daily.     DILTIAZEM (CARDIZEM CD) 360 MG 24 HR CAPSULE    Take 1 capsule (360 mg total) by mouth daily.   LEVOTHYROXINE (SYNTHROID, LEVOTHROID) 50 MCG TABLET    Take 45mcg by mouth every other day and take 75 mcg by mouth on alternate days   LYSINE PO    Take 1-2 tablets by mouth  daily as needed (fever blister).   METOPROLOL SUCCINATE (TOPROL-XL) 50 MG 24 HR TABLET    Take 1 tablet (50 mg total) by mouth daily.   MULTIPLE VITAMIN (MULTIVITAMIN) CAPSULE    Take 1 capsule by mouth daily.     OMEGA-3 FATTY ACIDS (FISH OIL) 1200 MG CAPS    Take 1 capsule by mouth daily. Mega Red.   POLYETHYL GLYCOL-PROPYL GLYCOL (SYSTANE OP)    Apply 1-2 drops to eye daily.   POLYETHYLENE GLYCOL POWDER (MIRALAX) POWDER    Take two capfuls by mouth at bedtime   PROBIOTIC PRODUCT (ALIGN PO)    Take 1 capsule by mouth daily.    SILVER SULFADIAZINE (SILVADENE) 1 % CREAM    Apply topically 2 (two) times daily.   TEMAZEPAM (RESTORIL) 30 MG CAPSULE    Take 30 mg by mouth at bedtime.   Modified Medications   No medications on file  Discontinued Medications   No medications on file     Physical Exam: Filed Vitals:   07/23/14 1505  BP: 113/64  Pulse: 86  Temp: 98.2 F (36.8 C)  Resp: 18  Weight: 120 lb (54.432 kg)  SpO2: 97%   Body mass index is 22.69 kg/(m^2). Physical Exam  Constitutional: She is oriented to person, place, and time. She appears well-developed and well-nourished. No distress.  HENT:  Left nare with dissolvable gauze tampon in place that is saturated with blood--she is dabbing her nose with a tissue and blood and sticky material are coming off onto the  tissue; some ecchymoses remain around both eyes  Eyes: EOM are normal. Pupils are equal, round, and reactive to light.  Cardiovascular: Normal rate, regular rhythm, normal heart sounds and intact distal pulses.   Pulmonary/Chest: Effort normal and breath sounds normal. She has no rales.  Abdominal: Soft. Bowel sounds are normal. She exhibits no distension. There is no tenderness.  Musculoskeletal: Normal range of motion.  Neurological: She is alert and oriented to person, place, and time.  Skin: Skin is warm and dry.  Right groin with hematoma and ecchymoses still present down medial thigh; silvadene and dressing in place  Psychiatric: She has a normal mood and affect.    Labs reviewed: Basic Metabolic Panel:  Recent Labs  07/16/14 1912 07/18/14 0352 07/19/14 0245  NA 137 141 140  K 3.1* 3.6 3.6  CL 103 106 104  CO2 27 27 27   GLUCOSE 117* 111* 109*  BUN 5* <5* 7  CREATININE 0.59 0.51 0.63  CALCIUM 8.1* 8.2* 8.3*  MG 1.9 2.0 2.0   Liver Function Tests:  Recent Labs  07/16/14 0225 07/16/14 1912 07/19/14 0245  AST 27 35 22  ALT 15 16 13*  ALKPHOS 43 49 49  BILITOT 0.8 1.1 0.8  PROT 4.9* 5.6* 5.4*  ALBUMIN 2.9* 3.0* 2.7*   No results for input(s): LIPASE, AMYLASE in the last 8760 hours. No results for input(s): AMMONIA in the last 8760 hours. CBC:  Recent Labs  07/15/14 0150  07/16/14 1912  07/19/14 0245 07/21/14 0440 07/22/14 0343  WBC 8.9  < > 12.5*  < > 9.4 9.7 10.4  NEUTROABS 7.0  --  8.7*  --  5.5  --   --   HGB 7.4*  < > 9.4*  < > 9.1* 9.7* 9.9*  HCT 22.2*  < > 27.9*  < > 27.7* 29.9* 30.5*  MCV 89.5  < > 84.5  < > 87.4 87.4 87.4  PLT 171  < > 148*  < >  234 328 353  < > = values in this interval not displayed. Imaging and Procedures obtained prior to AL admission: 07/14/14:  IR angiogram f/u study:  Status post super selective endovascular embolization of the left sphenopalatine, the infraorbital and the internal maxillary artery on the left, and of the  right sphenopalatine, the infraorbital, the internal maxillary branches, and of the of nasal branches of the right facial artery using PVA particles as described above.  A rounded prominence of contrast is to the left of the midline in the left floor of the left orbit, being supplied by the left ophthalmic artery. The significance of this is unclear at this time.This may represent a pseudo aneurysm or a vascular abnormality. Further evaluation of this may be needed with either an orbital MRI MRA exam or a CT of the orbits with contrast.  07/15/14: CT pelvis w/o contrast:  Focal hematoma of the right groin extending into the medial proximal thigh. Maximal dimensions are approximately 12 cm on axial images. No intraperitoneal or retroperitoneal hemorrhage within the pelvis.  Patient Care Team: Prince Solian, MD as PCP - General (Internal Medicine) Ruby Cola, MD as Consulting Physician (Otolaryngology) Rosetta Posner, MD as Consulting Physician (Vascular Surgery) Darlin Coco, MD as Consulting Physician (Cardiology)  Assessment/Plan 1. Posterior epistaxis -sounds like this was posterior and anterior  -is s/p several procedures by ENT and then IR to embolize the affected vessels -now has blood saturated packing in left nostril--await Dr. Theressa Millard opinion for next stops on that  2. Other specified hypothyroidism -cont current synthroid  3. PAF (paroxysmal atrial fibrillation) -has pacemaker, but rate had been rapid in hospital and diltiazem was increased to 360mg  from 240mg   4. Anticoagulation goal of INR 2 to 3 -when on coumadin for her afib -coumadin is on hold at present due to the epistaxis and groin hematoma -consider restarting in 2 wks from when it got under control  5. Hypokalemia -cont potassium rich diet and f/u bmp  6. Acute blood loss anemia -will f/u cbc next draw to reassess her hgb  7. Ulcer (traumatic) of oral mucosa -likely from intubation or extubation -using  lozenges and also ordered some magic mouthwash with lidocaine to help with this irritation  8. Acute delirium -seems very mild--only pt notes her confusion at this time, will monitor   Functional status: is independent in adls, but feels weak at present  Family/ staff Communication: discussed with AL nurse and nurse manager  Labs/tests ordered:  Cbc, bmp next draw, keep appts with Dr. Simeon Craft, Dr. Donnetta Hutching and Dr. Mare Ferrari  Mykell Genao L. Anikin Prosser, D.O. [facility] [address], [place] [address] Cell Phone (Mon-Fri 8am-5pm):  [phone] On Call:  [phone] & follow prompts after 5pm & weekends Office Phone:  [phone] Office Fax:  [phone]

## 2014-07-29 ENCOUNTER — Encounter: Payer: Self-pay | Admitting: Vascular Surgery

## 2014-07-30 ENCOUNTER — Encounter: Payer: Self-pay | Admitting: Vascular Surgery

## 2014-07-30 ENCOUNTER — Ambulatory Visit (INDEPENDENT_AMBULATORY_CARE_PROVIDER_SITE_OTHER): Payer: Medicare Other | Admitting: *Deleted

## 2014-07-30 ENCOUNTER — Ambulatory Visit (INDEPENDENT_AMBULATORY_CARE_PROVIDER_SITE_OTHER): Payer: Self-pay | Admitting: Vascular Surgery

## 2014-07-30 ENCOUNTER — Encounter: Payer: Self-pay | Admitting: Nurse Practitioner

## 2014-07-30 ENCOUNTER — Encounter: Payer: Self-pay | Admitting: Internal Medicine

## 2014-07-30 ENCOUNTER — Ambulatory Visit (INDEPENDENT_AMBULATORY_CARE_PROVIDER_SITE_OTHER): Payer: Medicare Other | Admitting: Nurse Practitioner

## 2014-07-30 VITALS — BP 118/57 | HR 61 | Resp 16 | Ht 60.75 in | Wt 117.8 lb

## 2014-07-30 VITALS — BP 90/60 | HR 61

## 2014-07-30 DIAGNOSIS — T148 Other injury of unspecified body region: Secondary | ICD-10-CM | POA: Diagnosis not present

## 2014-07-30 DIAGNOSIS — Z9889 Other specified postprocedural states: Secondary | ICD-10-CM

## 2014-07-30 DIAGNOSIS — R040 Epistaxis: Secondary | ICD-10-CM

## 2014-07-30 DIAGNOSIS — I495 Sick sinus syndrome: Secondary | ICD-10-CM

## 2014-07-30 DIAGNOSIS — I480 Paroxysmal atrial fibrillation: Secondary | ICD-10-CM | POA: Diagnosis not present

## 2014-07-30 DIAGNOSIS — I10 Essential (primary) hypertension: Secondary | ICD-10-CM

## 2014-07-30 DIAGNOSIS — T148XXA Other injury of unspecified body region, initial encounter: Secondary | ICD-10-CM

## 2014-07-30 LAB — CUP PACEART INCLINIC DEVICE CHECK
Battery Voltage: 2.99 V
Brady Statistic AP VP Percent: 0.11 %
Brady Statistic AS VP Percent: 0.25 %
Brady Statistic RA Percent Paced: 90.81 %
Brady Statistic RV Percent Paced: 0.36 %
Date Time Interrogation Session: 20160621093400
Lead Channel Impedance Value: 504 Ohm
Lead Channel Pacing Threshold Amplitude: 1 V
Lead Channel Sensing Intrinsic Amplitude: 1.4578
Lead Channel Setting Pacing Amplitude: 2.5 V
Lead Channel Setting Pacing Pulse Width: 0.4 ms
MDC IDC MSMT LEADCHNL RA PACING THRESHOLD PULSEWIDTH: 0.4 ms
MDC IDC MSMT LEADCHNL RV IMPEDANCE VALUE: 400 Ohm
MDC IDC MSMT LEADCHNL RV PACING THRESHOLD AMPLITUDE: 1 V
MDC IDC MSMT LEADCHNL RV PACING THRESHOLD PULSEWIDTH: 0.4 ms
MDC IDC MSMT LEADCHNL RV SENSING INTR AMPL: 9.7069
MDC IDC SET LEADCHNL RA PACING AMPLITUDE: 2 V
MDC IDC SET LEADCHNL RV SENSING SENSITIVITY: 0.9 mV
MDC IDC SET ZONE DETECTION INTERVAL: 400 ms
MDC IDC STAT BRADY AP VS PERCENT: 90.7 %
MDC IDC STAT BRADY AS VS PERCENT: 8.94 %
Zone Setting Detection Interval: 350 ms

## 2014-07-30 LAB — CBC
HCT: 32 % — ABNORMAL LOW (ref 36.0–46.0)
Hemoglobin: 10.6 g/dL — ABNORMAL LOW (ref 12.0–15.0)
MCHC: 32.9 g/dL (ref 30.0–36.0)
MCV: 88 fl (ref 78.0–100.0)
Platelets: 552 10*3/uL — ABNORMAL HIGH (ref 150.0–400.0)
RBC: 3.64 Mil/uL — ABNORMAL LOW (ref 3.87–5.11)
RDW: 16.7 % — ABNORMAL HIGH (ref 11.5–15.5)
WBC: 10.7 10*3/uL — ABNORMAL HIGH (ref 4.0–10.5)

## 2014-07-30 NOTE — Progress Notes (Signed)
Pacemaker check in clinic- add on (missed remote 07/29/14). Normal device function. Thresholds, sensing, impedances consistent with previous measurements. Device programmed to maximize longevity. 42 AT/AF episodes (7.4% +warfarin-- on hold currently r/t nose bleed)- longest 6 days, pk A > 400, pk V 194. 25 NSVT episodes- available EGMs show AF RVR. Device programmed at appropriate safety margins. Histogram distribution appropriate for patient activity level. Device programmed to optimize intrinsic conduction. Battery voltage 2.99V. Carelink 10/29/14, ROV with Amber in March 2017.

## 2014-07-30 NOTE — Progress Notes (Signed)
CARDIOLOGY OFFICE NOTE  Date:  07/30/2014    [name] Date of Birth: [birth year] Medical Record #[id number]  PCP:  Tivis Ringer, MD  Cardiologist:  Mare Ferrari   Chief Complaint  Patient presents with  . Atrial Fibrillation    One week post hospital visit - seen for Dr. Mare Ferrari    History of Present Illness: Kathryn [facility] is a 81 y.o. female who presents today for a post hospital visit. Seen for Dr. Mare Ferrari. She has a history of atrial fibrillation on chronic warfarin, hypothyroidism, and sick sinus syndrome status post pacemaker placement.  She presented to the ED with recurrent epistaxis. She was going to the movies with her girlfriends on 5/30 when she tripped over a newly constructed concrete barrier in the front of a parking space. Unfortunately she landed on her face. She went to an urgent care center who x-rayed her face and found no fracture. On 6/03 the patient developed epistaxis and was seen in [facility]. She left there at 2 AM on 6/4 with nasal packing in place. At approximately 2 AM on 6/5 her nose started bleeding again and soaked through the packing. She presented to [facility] ER. She was evaluated by Dr. Simeon Craft (ENT) who took her to the OR and embolized her left nare. Unfortunately shortly after the procedure she started to bleed profusely again. She was taken to IR for superselective embolization of Lt IMAX and infraorbital branches and Rt IMAX ,Infraorbital and nasal branches of facial artery using PVA particles.This was done thru her groin - she ended up needing surgical intervention on her groin due to significant skin breakdown from the tension of the hematoma -  Her initial INR was 1.73.  Her epistaxis resolved after second trip to OR - nasal packing is absorbable over 2-3 week period per ENT - prophylactic antibiotic continues - changed abx to oral to complete another week of tx. She was transfused. Coumadin was held. Had to see vascular for  her groin puncture. No intervention required.  She has PAF - did have some AF with RVR noted during this admission. Meds were adjusted.   Discharged on 07/22/2014. Need to consider resuming anticoagulation 2 weeks post discharge.   Comes in here today. Here alone. Events from earlier this month reviewed. Still with some oozing from her left nare. No active bleeding. Her groin is sore and she thinks she has another hematoma - seeing Dr. Donnetta Hutching and Dr. Simeon Craft today. No palpitations. Not dizzy. BP is soft. Remains off all anticoagulation. No chest pain. Not short of breath. Remains on amiodarone.   Past Medical History  Diagnosis Date  . Atrial fibrillation   . Hypertension   . Hypothyroidism   . Irritable bowel syndrome (IBS)   . Constipation   . Colonic polyp     adenomatous  . Diverticulosis   . Bradycardia     s/p PPM  . Hemorrhoids   . Arthritis   . Cataract     Past Surgical History  Procedure Laterality Date  . Pacemaker insertion  2010    implanted by Dr Doreatha Lew ([facility])  . Colonoscopy    . Nasal endoscopy with epistaxis control N/A 07/14/2014    Procedure: NASAL ENDOSCOPY WITH EPISTAXIS CONTROL;  Surgeon: Ruby Cola, MD;  Location: [facility];  Service: ENT;  Laterality: N/A;  . Radiology with anesthesia N/A 07/14/2014    Procedure: RADIOLOGY WITH ANESTHESIA;  Surgeon: Medication Radiologist, MD;  Location: [place];  Service: Radiology;  Laterality: N/A;  . Hematoma evacuation Right 07/15/2014    Procedure: EVACUATION Right Groin Hematoma;  Surgeon: Rosetta Posner, MD;  Location: [place];  Service: Vascular;  Laterality: Right;  . Nasal endoscopy with epistaxis control Bilateral 07/15/2014    Procedure: NASAL ENDOSCOPY WITH EPISTAXIS CONTROL;  Surgeon: Ruby Cola, MD;  Location: [place];  Service: ENT;  Laterality: Bilateral;     Medications: Current Outpatient Prescriptions  Medication Sig Dispense Refill  . acetaminophen (TYLENOL) 500 MG tablet Take 500-1,000 mg by mouth every 6 (six)  hours as needed for moderate pain or headache.    Marland Kitchen amiodarone (PACERONE) 100 MG tablet Take 50 mg by mouth daily.    . Ascorbic Acid (VITAMIN C) 1000 MG tablet Take 1,000 mg by mouth daily.      . Calcium-Vitamin D (CALTRATE 600 PLUS-VIT D PO) Take 1 tablet by mouth daily.    . cephALEXin (KEFLEX) 500 MG capsule Take 1 capsule (500 mg total) by mouth every 12 (twelve) hours. 14 capsule 0  . cholecalciferol (VITAMIN D) 1000 UNITS tablet Take 1,000 Units by mouth daily.      Marland Kitchen diltiazem (CARDIZEM CD) 360 MG 24 hr capsule Take 1 capsule (360 mg total) by mouth daily. 30 capsule 0  . guaifenesin (ROBITUSSIN) 100 MG/5ML syrup Take 200 mg by mouth 3 (three) times daily as needed for cough.    . levothyroxine (SYNTHROID, LEVOTHROID) 50 MCG tablet Take 42mcg by mouth every other day and take 75 mcg by mouth on alternate days    . LYSINE PO Take 1-2 tablets by mouth daily as needed (fever blister).    . metoprolol succinate (TOPROL-XL) 50 MG 24 hr tablet Take 1 tablet (50 mg total) by mouth daily. 90 tablet 6  . Multiple Vitamin (MULTIVITAMIN) capsule Take 1 capsule by mouth daily.      . Omega-3 Fatty Acids (FISH OIL) 1200 MG CAPS Take 1 capsule by mouth daily. Mega Red.    Vladimir Faster Glycol-Propyl Glycol (SYSTANE OP) Apply 1-2 drops to eye daily.    . polyethylene glycol powder (MIRALAX) powder Take two capfuls by mouth at bedtime    . Probiotic Product (ALIGN PO) Take 1 capsule by mouth daily.     . silver sulfADIAZINE (SILVADENE) 1 % cream Apply topically 2 (two) times daily. 50 g 0  . temazepam (RESTORIL) 30 MG capsule Take 30 mg by mouth at bedtime.      No current facility-administered medications for this visit.    Allergies: No Known Allergies  Social History: The patient  reports that she quit smoking about 34 years ago. Her smoking use included Cigarettes. She has never used smokeless tobacco. She reports that she drinks about 8.4 oz of alcohol per week. She reports that she does not  use illicit drugs.   Family History: The patient's family history includes Diabetes in her paternal grandfather; Heart attack in her father; Heart disease in her brother; Heart failure in her mother; Lung cancer in her son. There is no history of Colon cancer, Kidney disease, Liver disease, or Esophageal cancer.   Review of Systems: Please see the history of present illness.      All other systems are reviewed and negative.   Physical Exam: VS:  BP 90/60 mmHg  Pulse 61  SpO2 100% .  BMI There is no weight on file to calculate BMI.  Wt Readings from Last 3 Encounters:  07/23/14 120 lb (54.432 kg)  07/16/14  135 lb 5.8 oz (61.4 kg)  06/12/14 122 lb (55.339 kg)    General: Pleasant. Well developed, well nourished and in no acute distress.  HEENT: Normal but has packing in left nare. Neck: Supple, no JVD, carotid bruits, or masses noted.  Cardiac: Regular rate and rhythm. No murmurs, rubs, or gallops. No edema.  Respiratory:  Lungs are clear to auscultation bilaterally with normal work of breathing.  GI: Soft and nontender.  MS: No deformity or atrophy. Gait and ROM intact. Skin: Warm and dry. Color is normal. Groin dressing on the right noted. Extensive bruising down her leg. Neuro:  Strength and sensation are intact and no gross focal deficits noted.  Psych: Alert, appropriate and with normal affect.   LABORATORY DATA:  EKG:  EKG is ordered today. This demonstrates A pacing with underlying NSR   Lab Results  Component Value Date   WBC 10.4 07/22/2014   HGB 9.9* 07/22/2014   HCT 30.5* 07/22/2014   PLT 353 07/22/2014   GLUCOSE 109* 07/19/2014   ALT 13* 07/19/2014   AST 22 07/19/2014   NA 140 07/19/2014   K 3.6 07/19/2014   CL 104 07/19/2014   CREATININE 0.63 07/19/2014   BUN 7 07/19/2014   CO2 27 07/19/2014   INR 1.30 07/15/2014    BNP (last 3 results) No results for input(s): BNP in the last 8760 hours.  ProBNP (last 3 results) No results for input(s): PROBNP in  the last 8760 hours.   Other Studies Reviewed Today:   Assessment/Plan: 1. Fall - with significant epistaxis/transfusion/surgical intervention - seeing Dr. Simeon Craft today.  2. Hematoma - requiring surgical intervention - seeing Dr. Donnetta Hutching today  3. PAF - on higher doses of medicines. In sinus today. Holding on restarting anticoagulation for now.   4. HTN - BP pretty soft today - she does not want me to cut back on her medicines - she is not dizzy. She will increase her salt. Monitor BP at the facility.  5. PPM - missed her remote check - will see if we can spot check her today.   Current medicines are reviewed with the patient today.  The patient does not have concerns regarding medicines other than what has been noted above.  The following changes have been made:  See above.  Labs/ tests ordered today include:    Orders Placed This Encounter  Procedures  . CBC  . EKG 12-Lead     Disposition:   FU with Dr. Mare Ferrari in 10 days.   Patient is agreeable to this plan and will call if any problems develop in the interim.   Signed: Burtis Junes, RN, ANP-C 07/30/2014 9:00 AM  [facility] [address] [place], [place]  [address] Phone: [phone] Fax: [phone]

## 2014-07-30 NOTE — Progress Notes (Signed)
Here today for follow-up of evacuation of a large hematoma in her right medial thigh on 07/15/2014. She had had recurrent nosebleed and underwent procedure interventional radiology with coiling of the vessels to hopefully stop the nosebleed. She suffered complication very large tense hematoma and her medial right thigh. She was taken to the operating room with concern regarding regarding threatened skin viability. She had a somewhat prolonged hospital course and was eventually discharged. She presents today for wound check.  Dorsally her skin loss over this area was partial-thickness with blistering and this is now healing quite nicely. She does have a large amount of bruising which is continuing to be resolved. She did have some thickness in the midportion of the hematoma and appears though she is having a serous fluid collection in this area. This is not pulsatile. She does have a 2+ right popliteal and dorsalis pedis pulse. I imaged this area with SonoSite ultrasound and does appear to be mostly serous fluid.  Impression and plan: Serous fluid collection at the area of a large hematoma. The skin is viable. We'll see her again in one week. Discussed option of attempted to aspirate this versus allowing for continued resolution. Also explain the outside chance that she may require return to the operating room as an outpatient for evacuation of this area as well. She will continue her activity without limitation and we will see her again in one week. She is here today with her daughter who understands to proceed the plan as well

## 2014-07-30 NOTE — Patient Instructions (Addendum)
We will be checking the following labs today - CBC   Medication Instructions:    Continue with your current medicines.   Stay off aspirin and coumadin for now    Testing/Procedures To Be Arranged:  N/A  Follow-Up:  See Dr. Mare Ferrari in about 10 days   Other Special Instructions:   Monitor BP  Increase salt intake   Call the [place] office at [phone] if you have any questions, problems or concerns.

## 2014-08-02 ENCOUNTER — Encounter: Payer: Self-pay | Admitting: Vascular Surgery

## 2014-08-06 ENCOUNTER — Ambulatory Visit (INDEPENDENT_AMBULATORY_CARE_PROVIDER_SITE_OTHER): Payer: Self-pay | Admitting: Vascular Surgery

## 2014-08-06 ENCOUNTER — Encounter: Payer: Self-pay | Admitting: Vascular Surgery

## 2014-08-06 VITALS — BP 134/52 | HR 64 | Ht 60.0 in | Wt 119.5 lb

## 2014-08-06 DIAGNOSIS — Z9889 Other specified postprocedural states: Secondary | ICD-10-CM

## 2014-08-06 NOTE — Progress Notes (Signed)
Here today for continued follow-up of the evacuation of hematoma in her left thigh 1 07/15/2014. I'd seen her one week ago. It did appear that she was wanting office serous fluid collections that time. She was having resolution of infiltration of blood throughout her thigh. She is seen today to determine whether there was any role for drainage of this collection.  She looks quite good today. In the  Discomfort is resolving. The skin is completely intact over the area of the hematoma. Her incision is healed completely. She does continue to have extensive bruising which is resolving. The area of fullness in her groin is significantly less than it was one week ago.  Impression and plan: Continued resolution of hematoma. Do not see any role for aspiration of this this time. We'll see her again in 3 weeks for continued follow-up. Continues to have 2+ dorsalis pedis pulse. I do not see any contraindication to anticoagulation is indicated for cardiac reasons.

## 2014-08-07 ENCOUNTER — Encounter: Payer: Self-pay | Admitting: Cardiology

## 2014-08-07 ENCOUNTER — Ambulatory Visit (INDEPENDENT_AMBULATORY_CARE_PROVIDER_SITE_OTHER): Payer: Medicare Other | Admitting: Cardiology

## 2014-08-07 VITALS — BP 130/50 | HR 53 | Ht 60.0 in | Wt 119.0 lb

## 2014-08-07 DIAGNOSIS — T148 Other injury of unspecified body region: Secondary | ICD-10-CM | POA: Diagnosis not present

## 2014-08-07 DIAGNOSIS — I480 Paroxysmal atrial fibrillation: Secondary | ICD-10-CM | POA: Diagnosis not present

## 2014-08-07 DIAGNOSIS — T148XXA Other injury of unspecified body region, initial encounter: Secondary | ICD-10-CM

## 2014-08-07 MED ORDER — AMIODARONE HCL 100 MG PO TABS: ORAL_TABLET | ORAL | Status: DC

## 2014-08-07 MED ORDER — DILTIAZEM HCL ER COATED BEADS 360 MG PO CP24: 360.0000 mg | ORAL_CAPSULE | Freq: Every day | ORAL | Status: DC

## 2014-08-07 NOTE — Patient Instructions (Addendum)
Medication Instructions:  RESTART WARFARIN AS PREVIOUSLY TAKING (5 MG 1 TABLET X 4 DAYS AND 1/2 X 3 DAYS)  Labwork: NONE  Testing/Procedures: NONE  Follow-Up: Keep appointment 08/22/14  Return next week for Coumadin check

## 2014-08-07 NOTE — Progress Notes (Signed)
Cardiology Office Note   Date:  08/07/2014   ID:  Kathryn Gibson, DOB 11/20/1933, MRN [id number]  PCP:  Kathryn Ringer, MD  Cardiologist: Darlin Coco MD  Chief Complaint  Patient presents with  . Atrial Fibrillation      History of Present Illness: Kathryn Gibson is a 81 y.o. female who presents for for follow-up office visit. She has a past history of paroxysmal atrial fibrillation and tachybradycardia syndrome.  She has a functioning pacemaker.  She was in her usual state of health until several weeks ago when she tripped on a [facility] barrier [facility] of [facility].  She suffered severe trauma to her nose.  She was on Coumadin at the time.  She was going to the movies with her girlfriends on 5/30 when she tripped over a newly constructed concrete barrier in the front of a parking space. Unfortunately she landed on her face. She went to an urgent care center who x-rayed her face and found no fracture. On 6/03 the patient developed epistaxis and was seen in [place]. She left there at 2 AM on 6/4 with nasal packing in place. At approximately 2 AM on 6/5 her nose started bleeding again and soaked through the packing. She presented to [facility] ER. She was evaluated by Dr. Simeon Craft (ENT) who took her to the OR and embolized her left nare. Unfortunately shortly after the procedure she started to bleed profusely again. She was taken to IR for superselective embolization of Lt IMAX and infraorbital branches and Rt IMAX ,Infraorbital and nasal branches of facial artery using PVA particles.This was done thru her groin - she ended up needing surgical intervention on her groin due to significant skin breakdown from the tension of the hematoma - Her initial INR was 1.73. She has seen Dr. Donnetta Hutching twice and has a final appointment in follow-up with him in several weeks.  Right now she is off her Coumadin.  She is not having any active bleeding.  She has remained in sinus rhythm.  She did  have some paroxysmal atrial fibrillation from the stress of the acute injury.  Past Medical History  Diagnosis Date  . Atrial fibrillation   . Hypertension   . Hypothyroidism   . Irritable bowel syndrome (IBS)   . Constipation   . Colonic polyp     adenomatous  . Diverticulosis   . Bradycardia     s/p PPM  . Hemorrhoids   . Arthritis   . Cataract     Past Surgical History  Procedure Laterality Date  . Pacemaker insertion  2010    implanted by Dr Doreatha Lew ([facility])  . Colonoscopy    . Nasal endoscopy with epistaxis control N/A 07/14/2014    Procedure: NASAL ENDOSCOPY WITH EPISTAXIS CONTROL;  Surgeon: Ruby Cola, MD;  Location: [facility] OR;  Service: ENT;  Laterality: N/A;  . Radiology with anesthesia N/A 07/14/2014    Procedure: RADIOLOGY WITH ANESTHESIA;  Surgeon: Medication Radiologist, MD;  Location: [place];  Service: Radiology;  Laterality: N/A;  . Hematoma evacuation Right 07/15/2014    Procedure: EVACUATION Right Groin Hematoma;  Surgeon: Rosetta Posner, MD;  Location: [place];  Service: Vascular;  Laterality: Right;  . Nasal endoscopy with epistaxis control Bilateral 07/15/2014    Procedure: NASAL ENDOSCOPY WITH EPISTAXIS CONTROL;  Surgeon: Ruby Cola, MD;  Location: [place];  Service: ENT;  Laterality: Bilateral;     Current Outpatient Prescriptions  Medication Sig Dispense Refill  . acetaminophen (  TYLENOL) 500 MG tablet Take 500-1,000 mg by mouth every 6 (six) hours as needed for moderate pain or headache.    Marland Kitchen amiodarone (PACERONE) 100 MG tablet 1/2 TABLET BY MOUTH DAILY 45 tablet 3  . amoxicillin-clavulanate (AUGMENTIN) 500-125 MG per tablet Take 500 mg by mouth 2 (two) times daily.   0  . Ascorbic Acid (VITAMIN C) 1000 MG tablet Take 1,000 mg by mouth daily.      . Calcium-Vitamin D (CALTRATE 600 PLUS-VIT D PO) Take 1 tablet by mouth daily.    . cholecalciferol (VITAMIN D) 1000 UNITS tablet Take 1,000 Units by mouth daily.      Marland Kitchen diltiazem (CARDIZEM CD) 360 MG 24 hr capsule Take 1  capsule (360 mg total) by mouth daily. 90 capsule 3  . levothyroxine (SYNTHROID, LEVOTHROID) 50 MCG tablet Take 45mcg by mouth every other day and take 75 mcg by mouth on alternate days    . LYSINE PO Take 1-2 tablets by mouth daily as needed (fever blister).    . metoprolol succinate (TOPROL-XL) 50 MG 24 hr tablet Take 1 tablet (50 mg total) by mouth daily. 90 tablet 6  . Multiple Vitamin (MULTIVITAMIN) capsule Take 1 capsule by mouth daily.      . mupirocin ointment (BACTROBAN) 2 % Apply 1 application topically 3 (three) times daily.   0  . Omega-3 Fatty Acids (FISH OIL) 1200 MG CAPS Take 1 capsule by mouth daily. Mega Red.    Vladimir Faster Glycol-Propyl Glycol (SYSTANE OP) Apply 1-2 drops to eye daily.    . polyethylene glycol powder (GLYCOLAX/MIRALAX) powder Take 1 Container by mouth daily.   0  . Probiotic Product (ALIGN PO) Take 1 capsule by mouth daily.     . temazepam (RESTORIL) 30 MG capsule Take 30 mg by mouth at bedtime.     Marland Kitchen warfarin (COUMADIN) 5 MG tablet Take 5 mg by mouth as directed. As directed by Coumadin Clinic     No current facility-administered medications for this visit.    Allergies:   Review of patient's allergies indicates no known allergies.    Social History:  The patient  reports that she quit smoking about 34 years ago. Her smoking use included Cigarettes. She has never used smokeless tobacco. She reports that she drinks about 8.4 oz of alcohol per week. She reports that she does not use illicit drugs.   Family History:  The patient's family history includes Diabetes in her paternal grandfather; Heart attack in her father; Heart disease in her brother; Heart failure in her mother; Hypertension in her mother; Lung cancer in her son. There is no history of Colon cancer, Kidney disease, Liver disease, Esophageal cancer, or Stroke.    ROS:  Please see the history of present illness.   Otherwise, review of systems are positive for none.   All other systems are reviewed  and negative.    PHYSICAL EXAM: VS:  BP 130/50 mmHg  Pulse 53  Ht 5' (1.524 m)  Wt 119 lb (53.978 kg)  BMI 23.24 kg/m2  SpO2 98% , BMI Body mass index is 23.24 kg/(m^2). GEN: Well nourished, well developed, in no acute distress HEENT: normal Neck: no JVD, carotid bruits, or masses Cardiac: RRR; no murmurs, rubs, or gallops,no edema  Respiratory:  clear to auscultation bilaterally, normal work of breathing GI: soft, nontender, nondistended, + BS MS: no deformity or atrophy Skin: warm and dry, no rash Neuro:  Strength and sensation are intact Psych: euthymic mood, full affect  EKG:  EKG is not ordered today.    Recent Labs: 07/19/2014: ALT 13*; BUN 7; Creatinine, Ser 0.63; Magnesium 2.0; Potassium 3.6; Sodium 140 07/30/2014: Hemoglobin 10.6*; Platelets 552.0*    Lipid Panel No results found for: CHOL, TRIG, HDL, CHOLHDL, VLDL, LDLCALC, LDLDIRECT    Wt Readings from Last 3 Encounters:  08/07/14 119 lb (53.978 kg)  08/06/14 119 lb 8 oz (54.205 kg)  07/30/14 117 lb 12.8 oz (53.434 kg)        ASSESSMENT AND PLAN:  1.  Sick sinus syndrome with paroxysmal atrial fibrillation. 2.  Recent mechanical fall resulting in severe epistaxis 3.  Recent surgical intervention for large hematoma of right groin following percutaneous intervention for her epistaxis. 4.  Functioning pacemaker (Medtronic) 5.  Hypothyroidism followed by Dr. Dagmar Hait   Current medicines are reviewed at length with the patient today.  The patient does not have concerns regarding medicines.  The following changes have been made:  Continue current dose of amiodarone 50 mg daily.  Continue generic diltiazem CD 361 daily.  She is no longer on verapamil.  We will restart warfarin now and it would like her to attend the Coumadin clinic next week for follow-up labs.  She will keep her previously scheduled appointment with me on July 14  Labs/ tests ordered today include:  No orders of the defined types were placed  in this encounter.      Berna Spare MD 08/07/2014 7:21 PM    [address], [place], [place]  [address] Phone: [phone]; Fax: [phone]

## 2014-08-14 ENCOUNTER — Ambulatory Visit (INDEPENDENT_AMBULATORY_CARE_PROVIDER_SITE_OTHER): Payer: Medicare Other | Admitting: *Deleted

## 2014-08-14 ENCOUNTER — Telehealth: Payer: Self-pay | Admitting: *Deleted

## 2014-08-14 DIAGNOSIS — I4891 Unspecified atrial fibrillation: Secondary | ICD-10-CM

## 2014-08-14 DIAGNOSIS — Z5181 Encounter for therapeutic drug level monitoring: Secondary | ICD-10-CM

## 2014-08-14 DIAGNOSIS — I480 Paroxysmal atrial fibrillation: Secondary | ICD-10-CM

## 2014-08-14 LAB — POCT INR: INR: 1.5

## 2014-08-14 NOTE — Telephone Encounter (Signed)
-----   Message from Darlin Coco, MD sent at 08/14/2014  3:00 PM EDT ----- Regarding: RE: ASA resumption  No we will not restart ASA at this time. Will discuss with patient at next OV. ----- Message -----    From: Zenovia Jarred, RN    Sent: 08/14/2014   1:32 PM      To: Darlin Coco, MD Subject: ASA resumption                                 Pt has been off ASA after fall and serve nose bleeds, seen today in [facility], she wants to know if she should resume 81mg s ASA or not? Please advise.

## 2014-08-14 NOTE — Telephone Encounter (Signed)
Telephoned pt and made her aware to not restart ASA at this time per Dr Mare Ferrari, he will discuss at next OV.

## 2014-08-16 ENCOUNTER — Telehealth: Payer: Self-pay

## 2014-08-16 NOTE — Telephone Encounter (Signed)
Prior auth for Amiodarone 100mg sent to [employer]. 

## 2014-08-19 ENCOUNTER — Telehealth: Payer: Self-pay

## 2014-08-19 ENCOUNTER — Other Ambulatory Visit: Payer: Self-pay

## 2014-08-19 ENCOUNTER — Ambulatory Visit: Payer: Medicare Other | Admitting: Cardiology

## 2014-08-19 MED ORDER — DILTIAZEM HCL ER COATED BEADS 360 MG PO CP24: 360.0000 mg | ORAL_CAPSULE | Freq: Every day | ORAL | Status: DC

## 2014-08-19 NOTE — Telephone Encounter (Signed)
30 day supply of Diltiazem 360mg  sent to [employer] till mail order arrives.

## 2014-08-22 ENCOUNTER — Encounter: Payer: Self-pay | Admitting: Cardiology

## 2014-08-22 ENCOUNTER — Ambulatory Visit (INDEPENDENT_AMBULATORY_CARE_PROVIDER_SITE_OTHER): Payer: Medicare Other | Admitting: Cardiology

## 2014-08-22 ENCOUNTER — Ambulatory Visit (INDEPENDENT_AMBULATORY_CARE_PROVIDER_SITE_OTHER): Payer: Medicare Other | Admitting: Pharmacist

## 2014-08-22 VITALS — BP 124/50 | HR 62 | Ht 60.0 in | Wt 119.0 lb

## 2014-08-22 DIAGNOSIS — Z5181 Encounter for therapeutic drug level monitoring: Secondary | ICD-10-CM

## 2014-08-22 DIAGNOSIS — I480 Paroxysmal atrial fibrillation: Secondary | ICD-10-CM

## 2014-08-22 DIAGNOSIS — I4891 Unspecified atrial fibrillation: Secondary | ICD-10-CM | POA: Diagnosis not present

## 2014-08-22 DIAGNOSIS — I10 Essential (primary) hypertension: Secondary | ICD-10-CM | POA: Diagnosis not present

## 2014-08-22 LAB — POCT INR: INR: 3.3

## 2014-08-22 NOTE — Patient Instructions (Signed)
Medication Instructions:  Your physician recommends that you continue on your current medications as directed. Please refer to the Current Medication list given to you today.  Labwork: NONE  Testing/Procedures: NONE  Follow-Up: Your physician wants you to follow-up in: 4 MONTH You will receive a reminder letter in the mail two months in advance. If you don't receive a letter, please call our office to schedule the follow-up appointment.

## 2014-08-22 NOTE — Progress Notes (Signed)
Cardiology Office Note   Date:  08/22/2014   ID:  Kathryn Gibson, DOB May 08, 1933, MRN [id number]  PCP:  Tivis Ringer, MD  Cardiologist: Darlin Coco MD  Chief Complaint  Patient presents with  . Atrial Fibrillation      History of Present Illness: Kathryn Gibson is a 81 y.o. female who presents for scheduled follow-up office visit.  She has a past history of paroxysmal atrial fibrillation and tachybradycardia syndrome. She has a functioning pacemaker. She was in her usual state of health until last month when she tripped on a concrete barrier outside of [facility]. She suffered severe trauma to her nose. She was on Coumadin at the time. She was going to the movies with her girlfriends on 5/30 when she tripped over a newly constructed concrete barrier in the front of a parking space. Unfortunately she landed on her face. She went to an urgent care center who x-rayed her face and found no fracture. On 6/03 the patient developed epistaxis and was seen in [place]. She left there at 2 AM on 6/4 with nasal packing in place. At approximately 2 AM on 6/5 her nose started bleeding again and soaked through the packing. She presented to [facility] ER. She was evaluated by Dr. Simeon Craft (ENT) who took her to the OR and embolized her left nare. Unfortunately shortly after the procedure she started to bleed profusely again. She was taken to IR for superselective embolization of Lt IMAX and infraorbital branches and Rt IMAX ,Infraorbital and nasal branches of facial artery using PVA particles.This was done thru her groin - she ended up needing surgical intervention on her groin due to significant skin breakdown from the tension of the hematoma - Her initial INR was 1.73.  Her groin has now stabilized and she is back on her warfarin.  She has had no further epistaxis.  Her INR today is actually high at 3.3 and she has been instructed to eat some greens tonight by the coagulation  staff. She has noted occasional mild ankle edema.  This may be from her high dose diltiazem.  She is not having any symptoms of congestive heart failure.  Her edema has improved since she has been more physically active this past week.  She is back going to water aerobics etc. She has not had any recurrent atrial fibrillation.  No chest pain.    Past Medical History  Diagnosis Date  . Atrial fibrillation   . Hypertension   . Hypothyroidism   . Irritable bowel syndrome (IBS)   . Constipation   . Colonic polyp     adenomatous  . Diverticulosis   . Bradycardia     s/p PPM  . Hemorrhoids   . Arthritis   . Cataract     Past Surgical History  Procedure Laterality Date  . Pacemaker insertion  2010    implanted by Dr Doreatha Lew ([facility])  . Colonoscopy    . Nasal endoscopy with epistaxis control N/A 07/14/2014    Procedure: NASAL ENDOSCOPY WITH EPISTAXIS CONTROL;  Surgeon: Ruby Cola, MD;  Location: [facility] OR;  Service: ENT;  Laterality: N/A;  . Radiology with anesthesia N/A 07/14/2014    Procedure: RADIOLOGY WITH ANESTHESIA;  Surgeon: Medication Radiologist, MD;  Location: [place];  Service: Radiology;  Laterality: N/A;  . Hematoma evacuation Right 07/15/2014    Procedure: EVACUATION Right Groin Hematoma;  Surgeon: Rosetta Posner, MD;  Location: [place];  Service: Vascular;  Laterality: Right;  .  Nasal endoscopy with epistaxis control Bilateral 07/15/2014    Procedure: NASAL ENDOSCOPY WITH EPISTAXIS CONTROL;  Surgeon: Ruby Cola, MD;  Location: [place];  Service: ENT;  Laterality: Bilateral;     Current Outpatient Prescriptions  Medication Sig Dispense Refill  . acetaminophen (TYLENOL) 500 MG tablet Take 500-1,000 mg by mouth every 6 (six) hours as needed for moderate pain or headache.    Marland Kitchen amiodarone (PACERONE) 100 MG tablet 1/2 TABLET BY MOUTH DAILY 45 tablet 3  . Ascorbic Acid (VITAMIN C) 1000 MG tablet Take 1,000 mg by mouth daily.      . Calcium-Vitamin D (CALTRATE 600 PLUS-VIT D PO) Take 1  tablet by mouth daily.    . cholecalciferol (VITAMIN D) 1000 UNITS tablet Take 1,000 Units by mouth daily.      Marland Kitchen diltiazem (CARDIZEM CD) 360 MG 24 hr capsule Take 1 capsule (360 mg total) by mouth daily. 30 capsule 0  . levothyroxine (SYNTHROID, LEVOTHROID) 50 MCG tablet Take 4mcg by mouth every other day and take 75 mcg by mouth on alternate days    . LYSINE PO Take 1-2 tablets by mouth daily as needed (fever blister).    . metoprolol succinate (TOPROL-XL) 50 MG 24 hr tablet Take 1 tablet (50 mg total) by mouth daily. 90 tablet 6  . Multiple Vitamin (MULTIVITAMIN) capsule Take 1 capsule by mouth daily.      . mupirocin ointment (BACTROBAN) 2 % Apply 1 application topically 3 (three) times daily.   0  . Omega-3 Fatty Acids (FISH OIL) 1200 MG CAPS Take 1 capsule by mouth daily. Mega Red.    Vladimir Faster Glycol-Propyl Glycol (SYSTANE OP) Apply 1-2 drops to eye daily.    . polyethylene glycol powder (GLYCOLAX/MIRALAX) powder Take 1 Container by mouth daily.   0  . Probiotic Product (ALIGN PO) Take 1 capsule by mouth daily.     . temazepam (RESTORIL) 30 MG capsule Take 30 mg by mouth at bedtime.     Marland Kitchen warfarin (COUMADIN) 5 MG tablet Take 5 mg by mouth as directed. As directed by Coumadin Clinic     No current facility-administered medications for this visit.    Allergies:   Review of patient's allergies indicates no known allergies.    Social History:  The patient  reports that she quit smoking about 34 years ago. Her smoking use included Cigarettes. She has never used smokeless tobacco. She reports that she drinks about 8.4 oz of alcohol per week. She reports that she does not use illicit drugs.   Family History:  The patient's family history includes Diabetes in her paternal grandfather; Heart attack in her father; Heart disease in her brother; Heart failure in her mother; Hypertension in her mother; Lung cancer in her son. There is no history of Colon cancer, Kidney disease, Liver disease,  Esophageal cancer, or Stroke.    ROS:  Please see the history of present illness.   Otherwise, review of systems are positive for none.   All other systems are reviewed and negative.    PHYSICAL EXAM: VS:  BP 124/50 mmHg  Pulse 62  Ht 5' (1.524 m)  Wt 119 lb (53.978 kg)  BMI 23.24 kg/m2  SpO2 98% , BMI Body mass index is 23.24 kg/(m^2). GEN: Well nourished, well developed, in no acute distress HEENT: normal Neck: no JVD, carotid bruits, or masses Cardiac: RRR; no murmurs, rubs, or gallops,no edema  Respiratory:  clear to auscultation bilaterally, normal work of breathing GI: soft, nontender,  nondistended, + BS MS: no deformity or atrophy Skin: warm and dry, no rash Neuro:  Strength and sensation are intact Psych: euthymic mood, full affect   EKG:  EKG is not ordered today.    Recent Labs: 07/19/2014: ALT 13*; BUN 7; Creatinine, Ser 0.63; Magnesium 2.0; Potassium 3.6; Sodium 140 07/30/2014: Hemoglobin 10.6*; Platelets 552.0*    Lipid Panel No results found for: CHOL, TRIG, HDL, CHOLHDL, VLDL, LDLCALC, LDLDIRECT    Wt Readings from Last 3 Encounters:  08/22/14 119 lb (53.978 kg)  08/07/14 119 lb (53.978 kg)  08/06/14 119 lb 8 oz (54.205 kg)        ASSESSMENT AND PLAN:  1. Sick sinus syndrome with paroxysmal atrial fibrillation. 2. Recent mechanical fall resulting in severe epistaxis 3. Recent surgical intervention for large hematoma of right groin following percutaneous intervention for her epistaxis. 4. Functioning pacemaker (Medtronic) 5. Hypothyroidism followed by Dr. Dagmar Hait   Current medicines are reviewed at length with the patient today. The patient does not have concerns regarding medicines.  The following changes have been made: Continue current dose of amiodarone 50 mg daily. Continue generic diltiazem CD 361 daily. She is no longer on verapamil.She is back on her warfarin.  Current medicines are reviewed at length with the patient today.  The  patient does not have concerns regarding medicines.  The following changes have been made:  no change  Labs/ tests ordered today include:  No orders of the defined types were placed in this encounter.     Disposition: We talked about possibly reducing the dose of her diltiazem CD.  However she has a large quantity of her current dose.  She would prefer not to change.  Some of her pedal edema has improved with increased activity.  Continue to observe.  Consider reduction in dosage at next visit if symptoms persist.  Recheck in 4 months for a follow-up office visit.   Berna Spare MD 08/22/2014 3:14 PM    [facility] [address], [place], [place]  [address] Phone: [phone]; Fax: [phone]

## 2014-08-23 ENCOUNTER — Encounter: Payer: Self-pay | Admitting: Vascular Surgery

## 2014-08-27 ENCOUNTER — Ambulatory Visit (INDEPENDENT_AMBULATORY_CARE_PROVIDER_SITE_OTHER): Payer: Self-pay | Admitting: Vascular Surgery

## 2014-08-27 ENCOUNTER — Encounter: Payer: Self-pay | Admitting: Vascular Surgery

## 2014-08-27 VITALS — BP 120/65 | HR 61 | Temp 97.1°F | Resp 18 | Ht 61.0 in | Wt 123.4 lb

## 2014-08-27 DIAGNOSIS — Z9889 Other specified postprocedural states: Secondary | ICD-10-CM

## 2014-08-27 NOTE — Progress Notes (Signed)
Here today for continued follow-up of her right groin hematoma. She is continue to have marked resolution of this. The area in her thigh is COMPLETELY RESOLVED. SHE DOES HAVE SOME DARK STAINING DIFFUSELY IN HER THIGH PARTICULARLY IN THE MEDIAL GROIN AREA FROM THE EXTENSIVE HEMATOMA THAT WAS PRESENT. THE INCISION ITSELF IS WELL-HEALED AND THE DORSALIS PEDIS PULSES ARE 2+ BILATERALLY.  He continues to have some slight was in her medial groin related to hematoma and this is resolving as well.  The patient is released for full activity and will see [place] again on as-needed basis

## 2014-09-05 ENCOUNTER — Ambulatory Visit (INDEPENDENT_AMBULATORY_CARE_PROVIDER_SITE_OTHER): Payer: Medicare Other | Admitting: Pharmacist

## 2014-09-05 DIAGNOSIS — Z5181 Encounter for therapeutic drug level monitoring: Secondary | ICD-10-CM | POA: Diagnosis not present

## 2014-09-05 DIAGNOSIS — I480 Paroxysmal atrial fibrillation: Secondary | ICD-10-CM

## 2014-09-05 DIAGNOSIS — I4891 Unspecified atrial fibrillation: Secondary | ICD-10-CM

## 2014-09-05 LAB — POCT INR: INR: 2.9

## 2014-09-19 ENCOUNTER — Other Ambulatory Visit: Payer: Self-pay | Admitting: Cardiology

## 2014-09-26 ENCOUNTER — Ambulatory Visit (INDEPENDENT_AMBULATORY_CARE_PROVIDER_SITE_OTHER): Payer: Medicare Other | Admitting: *Deleted

## 2014-09-26 DIAGNOSIS — I480 Paroxysmal atrial fibrillation: Secondary | ICD-10-CM | POA: Diagnosis not present

## 2014-09-26 DIAGNOSIS — Z5181 Encounter for therapeutic drug level monitoring: Secondary | ICD-10-CM | POA: Diagnosis not present

## 2014-09-26 DIAGNOSIS — I4891 Unspecified atrial fibrillation: Secondary | ICD-10-CM | POA: Diagnosis not present

## 2014-09-26 LAB — POCT INR: INR: 2.1

## 2014-10-24 ENCOUNTER — Ambulatory Visit (INDEPENDENT_AMBULATORY_CARE_PROVIDER_SITE_OTHER): Payer: Medicare Other | Admitting: *Deleted

## 2014-10-24 DIAGNOSIS — I480 Paroxysmal atrial fibrillation: Secondary | ICD-10-CM

## 2014-10-24 DIAGNOSIS — Z5181 Encounter for therapeutic drug level monitoring: Secondary | ICD-10-CM

## 2014-10-24 DIAGNOSIS — I4891 Unspecified atrial fibrillation: Secondary | ICD-10-CM

## 2014-10-24 LAB — POCT INR: INR: 2.5

## 2014-10-29 ENCOUNTER — Telehealth: Payer: Self-pay | Admitting: Cardiology

## 2014-10-29 ENCOUNTER — Ambulatory Visit (INDEPENDENT_AMBULATORY_CARE_PROVIDER_SITE_OTHER): Payer: Medicare Other | Admitting: *Deleted

## 2014-10-29 DIAGNOSIS — I495 Sick sinus syndrome: Secondary | ICD-10-CM

## 2014-10-29 NOTE — Telephone Encounter (Signed)
LMOVM reminding pt to send remote transmission.   

## 2014-10-29 NOTE — Telephone Encounter (Signed)
New Message  4. Are you calling to see if we received your device transmission? N  Pt is not sure how to send the signal with the new part. Please assist.

## 2014-10-30 NOTE — Telephone Encounter (Signed)
LMOVM informing her that her transmission was received.

## 2014-10-30 NOTE — Progress Notes (Signed)
Remote pacemaker transmission.   

## 2014-11-02 LAB — CUP PACEART REMOTE DEVICE CHECK
Battery Voltage: 2.97 V
Brady Statistic AS VS Percent: 0.99 %
Brady Statistic RA Percent Paced: 99.01 %
Lead Channel Impedance Value: 416 Ohm
Lead Channel Sensing Intrinsic Amplitude: 0.884 mV
Lead Channel Sensing Intrinsic Amplitude: 9.372 mV
Lead Channel Setting Pacing Amplitude: 2 V
Lead Channel Setting Pacing Amplitude: 2.5 V
MDC IDC MSMT LEADCHNL RA IMPEDANCE VALUE: 512 Ohm
MDC IDC SESS DTM: 20160920211234
MDC IDC SET LEADCHNL RV PACING PULSEWIDTH: 0.4 ms
MDC IDC SET LEADCHNL RV SENSING SENSITIVITY: 0.9 mV
MDC IDC SET ZONE DETECTION INTERVAL: 350 ms
MDC IDC STAT BRADY AP VP PERCENT: 0.01 %
MDC IDC STAT BRADY AP VS PERCENT: 98.99 %
MDC IDC STAT BRADY AS VP PERCENT: 0 %
MDC IDC STAT BRADY RV PERCENT PACED: 0.01 %
Zone Setting Detection Interval: 400 ms

## 2014-11-08 ENCOUNTER — Encounter: Payer: Self-pay | Admitting: Cardiology

## 2014-11-20 ENCOUNTER — Encounter: Payer: Self-pay | Admitting: Internal Medicine

## 2014-11-21 ENCOUNTER — Ambulatory Visit (INDEPENDENT_AMBULATORY_CARE_PROVIDER_SITE_OTHER): Payer: Medicare Other | Admitting: Pharmacist

## 2014-11-21 DIAGNOSIS — I480 Paroxysmal atrial fibrillation: Secondary | ICD-10-CM

## 2014-11-21 DIAGNOSIS — I4891 Unspecified atrial fibrillation: Secondary | ICD-10-CM | POA: Diagnosis not present

## 2014-11-21 DIAGNOSIS — Z5181 Encounter for therapeutic drug level monitoring: Secondary | ICD-10-CM

## 2014-11-21 LAB — POCT INR: INR: 2.3

## 2014-12-05 ENCOUNTER — Encounter: Payer: Self-pay | Admitting: Internal Medicine

## 2014-12-12 ENCOUNTER — Ambulatory Visit (INDEPENDENT_AMBULATORY_CARE_PROVIDER_SITE_OTHER): Payer: Medicare Other | Admitting: Cardiology

## 2014-12-12 ENCOUNTER — Encounter: Payer: Self-pay | Admitting: Cardiology

## 2014-12-12 VITALS — BP 120/66 | HR 63 | Ht 61.0 in | Wt 123.1 lb

## 2014-12-12 DIAGNOSIS — I480 Paroxysmal atrial fibrillation: Secondary | ICD-10-CM | POA: Diagnosis not present

## 2014-12-12 MED ORDER — DILTIAZEM HCL ER COATED BEADS 360 MG PO CP24: 360.0000 mg | ORAL_CAPSULE | Freq: Every day | ORAL | Status: DC

## 2014-12-12 MED ORDER — AMIODARONE HCL 100 MG PO TABS: ORAL_TABLET | ORAL | Status: DC

## 2014-12-12 NOTE — Progress Notes (Signed)
Cardiology Office Note   Date:  12/12/2014   ID:  Kathryn Gibson, DOB 1933/08/23, MRN [id number]  PCP:  Tivis Ringer, MD  Cardiologist: Darlin Coco MD  Chief Complaint  Patient presents with  . Atrial Fibrillation      History of Present Illness: Kathryn Gibson is a 81 y.o. female who presents for four-month follow-up visit  She has a past history of paroxysmal atrial fibrillation and tachybradycardia syndrome. She has a functioning pacemaker.Recently she has been aware of increased racing of her heart.  She has been concerned that she is back in atrial fibrillation.  At the current time she is taking amiodarone 50 mg daily and Toprol 50 mg daily and diltiazem CD 360 mg daily.  Her blood pressure has been satisfactory.  She has been physically active participating in a lot of the exercise classes at [facility].  She has not been having any chest pain.  No dizziness or syncope.  No symptoms of CHF.  Past Medical History  Diagnosis Date  . Atrial fibrillation ([place])   . Hypertension   . Hypothyroidism   . Irritable bowel syndrome (IBS)   . Constipation   . Colonic polyp     adenomatous  . Diverticulosis   . Bradycardia     s/p PPM  . Hemorrhoids   . Arthritis   . Cataract     Past Surgical History  Procedure Laterality Date  . Pacemaker insertion  2010    implanted by Dr Doreatha Lew (MDT)  . Colonoscopy    . Nasal endoscopy with epistaxis control N/A 07/14/2014    Procedure: NASAL ENDOSCOPY WITH EPISTAXIS CONTROL;  Surgeon: Ruby Cola, MD;  Location: [facility] OR;  Service: ENT;  Laterality: N/A;  . Radiology with anesthesia N/A 07/14/2014    Procedure: RADIOLOGY WITH ANESTHESIA;  Surgeon: Medication Radiologist, MD;  Location: [place];  Service: Radiology;  Laterality: N/A;  . Hematoma evacuation Right 07/15/2014    Procedure: EVACUATION Right Groin Hematoma;  Surgeon: Rosetta Posner, MD;  Location: [place];  Service: Vascular;  Laterality: Right;  . Nasal endoscopy with  epistaxis control Bilateral 07/15/2014    Procedure: NASAL ENDOSCOPY WITH EPISTAXIS CONTROL;  Surgeon: Ruby Cola, MD;  Location: [place];  Service: ENT;  Laterality: Bilateral;     Current Outpatient Prescriptions  Medication Sig Dispense Refill  . acetaminophen (TYLENOL) 500 MG tablet Take 500-1,000 mg by mouth every 6 (six) hours as needed for moderate pain or headache.    Marland Kitchen amiodarone (PACERONE) 100 MG tablet 1 TABLET BY MOUTH TWICE A DAY FOR 3 DAYS AND THEN 1 TABLET DAILY 45 tablet 3  . Ascorbic Acid (VITAMIN C) 1000 MG tablet Take 1,000 mg by mouth daily.      . Calcium-Vitamin D (CALTRATE 600 PLUS-VIT D PO) Take 1 tablet by mouth daily.    . cholecalciferol (VITAMIN D) 1000 UNITS tablet Take 1,000 Units by mouth daily.      Marland Kitchen diltiazem (CARDIZEM CD) 360 MG 24 hr capsule Take 1 capsule (360 mg total) by mouth daily. 30 capsule 11  . LYSINE PO Take 1-2 tablets by mouth daily as needed (fever blister).    . metoprolol succinate (TOPROL-XL) 50 MG 24 hr tablet Take 1 tablet (50 mg total) by mouth daily. 90 tablet 6  . metoprolol succinate (TOPROL-XL) 50 MG 24 hr tablet Take 50 mg by mouth as directed. TAKE 1 TABLET IN THE MORNING AND OK TO TAKE 1 IN THE EVENING AS  NEEDED Take with or immediately following a meal.    . Multiple Vitamin (MULTIVITAMIN) capsule Take 1 capsule by mouth daily.      . mupirocin ointment (BACTROBAN) 2 % Apply 1 application topically 3 (three) times daily.   0  . Omega-3 Fatty Acids (FISH OIL) 1200 MG CAPS Take 1 capsule by mouth daily. Mega Red.    Vladimir Faster Glycol-Propyl Glycol (SYSTANE OP) Place 1-2 drops into both eyes daily.     . polyethylene glycol powder (GLYCOLAX/MIRALAX) powder Take 2 Containers by mouth daily.   0  . Probiotic Product (ALIGN PO) Take 1 capsule by mouth daily.     Marland Kitchen SYNTHROID 75 MCG tablet Take 50 mcg by mouth daily before breakfast.  2  . temazepam (RESTORIL) 30 MG capsule Take 30 mg by mouth at bedtime.     Marland Kitchen warfarin (COUMADIN) 5 MG  tablet Take 5 mg by mouth as directed. As directed by [facility]     No current facility-administered medications for this visit.    Allergies:   Review of patient's allergies indicates no known allergies.    Social History:  The patient  reports that she quit smoking about 34 years ago. Her smoking use included Cigarettes. She has never used smokeless tobacco. She reports that she drinks about 8.4 oz of alcohol per week. She reports that she does not use illicit drugs.   Family History:  The patient's family history includes Diabetes in her paternal grandfather; Heart attack in her father; Heart disease in her brother; Heart failure in her mother; Hypertension in her mother; Lung cancer in her son. There is no history of Colon cancer, Kidney disease, Liver disease, Esophageal cancer, or Stroke.    ROS:  Please see the history of present illness.   Otherwise, review of systems are positive for none.   All other systems are reviewed and negative.    PHYSICAL EXAM: VS:  BP 120/66 mmHg  Pulse 63  Ht 5\' 1"  (1.549 m)  Wt 123 lb 1.9 oz (55.847 kg)  BMI 23.28 kg/m2 , BMI Body mass index is 23.28 kg/(m^2). GEN: Well nourished, well developed, in no acute distress HEENT: normal Neck: no JVD, carotid bruits, or masses Cardiac: Irregularly irregular; no murmurs, rubs, or gallops,no edema  Respiratory:  clear to auscultation bilaterally, normal work of breathing GI: soft, nontender, nondistended, + BS MS: no deformity or atrophy Skin: warm and dry, no rash Neuro:  Strength and sensation are intact Psych: euthymic mood, full affect   EKG:  EKG is ordered today. The ekg ordered today demonstrates atrial fibrillation with demand ventricular pacing.  Since 07/30/14, atrial fibrillation is again present.   Recent Labs: 07/19/2014: ALT 13*; BUN 7; Creatinine, Ser 0.63; Magnesium 2.0; Potassium 3.6; Sodium 140 07/30/2014: Hemoglobin 10.6*; Platelets 552.0*    Lipid Panel No results found  for: CHOL, TRIG, HDL, CHOLHDL, VLDL, LDLCALC, LDLDIRECT    Wt Readings from Last 3 Encounters:  12/12/14 123 lb 1.9 oz (55.847 kg)  08/27/14 123 lb 6.4 oz (55.974 kg)  08/22/14 119 lb (53.978 kg)        ASSESSMENT AND PLAN:  1. Sick sinus syndrome with paroxysmal atrial fibrillation.  2.. Functioning pacemaker ([employer]tronic) 3.. Hypothyroidism followed by Dr. Dagmar Hait   Current medicines are reviewed at length with the patient today. The patient does not have concerns regarding medicines.     Current medicines are reviewed at length with the patient today.  The patient has concerns regarding medicines.  The following changes have been made:  She will increase her amiodarone up to 100 mg twice a day for 3 days and then 100 mg daily thereafter.  Continue Toprol every morning and if her heart rate is rapid in the evening she can take a second Toprol.  Labs/ tests ordered today include:   Orders Placed This Encounter  Procedures  . EKG 12-Lead    Disposition: Recheck in 4 months for office visit, or sooner when necessary.  Continue long-term warfarin.  Berna Spare MD 12/12/2014 6:38 PM    [address] [place], [place], [place]  [address] Phone: [phone]; Fax: [phone]

## 2014-12-12 NOTE — Patient Instructions (Signed)
Medication Instructions:  INCREASE YOUR AMIODARONE TO 100 MG TWICE A DAY FOR 3 DAYS AND THEN 100 MG ONCE A DAY  OK TO TAKE AN EXTRA METOPROLOL IN THE EVENING AS NEEDED   Labwork: NONE  Testing/Procedures: NONE  Follow-Up: Your physician recommends that you schedule a follow-up appointment in: [place] G NP  If you need a refill on your cardiac medications before your next appointment, please call your pharmacy.

## 2014-12-13 ENCOUNTER — Telehealth: Payer: Self-pay | Admitting: Cardiology

## 2014-12-13 NOTE — Telephone Encounter (Signed)
New message      Pt was seen yesterday.  She is still in AFIB.  Please call she has questions

## 2014-12-13 NOTE — Telephone Encounter (Signed)
Spoke with patient and she feels heart rate still controlled  Advised ok to resume regular activities as advised yesterday per  Dr. Mare Ferrari and to call back Monday with update  Patient agreeable to plan

## 2014-12-16 ENCOUNTER — Telehealth: Payer: Self-pay | Admitting: Cardiology

## 2014-12-16 NOTE — Telephone Encounter (Signed)
Will forward to  Dr. Brackbill so he will be aware 

## 2014-12-16 NOTE — Telephone Encounter (Signed)
Good news.

## 2014-12-16 NOTE — Telephone Encounter (Signed)
New Message    Pt calling stating that she is back in rhythm since this morning and no longer needs Rip Harbour to call her.

## 2014-12-27 ENCOUNTER — Telehealth: Payer: Self-pay | Admitting: Cardiology

## 2014-12-27 MED ORDER — METOPROLOL SUCCINATE ER 50 MG PO TB24: 50.0000 mg | ORAL_TABLET | Freq: Every day | ORAL | Status: DC

## 2014-12-27 NOTE — Telephone Encounter (Signed)
Pt's Rx was sent to pt's pharmacy as requested. Confirmation received.  °

## 2014-12-31 ENCOUNTER — Ambulatory Visit (INDEPENDENT_AMBULATORY_CARE_PROVIDER_SITE_OTHER): Payer: Medicare Other | Admitting: *Deleted

## 2014-12-31 DIAGNOSIS — Z5181 Encounter for therapeutic drug level monitoring: Secondary | ICD-10-CM | POA: Diagnosis not present

## 2014-12-31 DIAGNOSIS — I480 Paroxysmal atrial fibrillation: Secondary | ICD-10-CM | POA: Diagnosis not present

## 2014-12-31 DIAGNOSIS — I4891 Unspecified atrial fibrillation: Secondary | ICD-10-CM | POA: Diagnosis not present

## 2014-12-31 LAB — POCT INR: INR: 2

## 2015-01-16 ENCOUNTER — Telehealth: Payer: Self-pay | Admitting: Cardiology

## 2015-01-16 MED ORDER — AMIODARONE HCL 200 MG PO TABS: ORAL_TABLET | ORAL | Status: DC

## 2015-01-16 NOTE — Telephone Encounter (Signed)
Spoke with patient and her Amiodarone 100 mg will be $51 vs $6 for the 200 mg tablets and cutting them in half Will send Rx for 200 mg take 1/2 tablet daily to mail order as requested

## 2015-01-16 NOTE — Telephone Encounter (Signed)
New MEssage   Pt requested to speak w/ RN about changing the dosage for her amiodorone. Asked pt if it could wait until Friday- pt preferred to speak w/ RN today. Please call back and discuss.

## 2015-01-16 NOTE — Telephone Encounter (Signed)
Agree 

## 2015-01-23 ENCOUNTER — Telehealth: Payer: Self-pay | Admitting: Cardiology

## 2015-01-23 MED ORDER — METOPROLOL SUCCINATE ER 50 MG PO TB24: 50.0000 mg | ORAL_TABLET | Freq: Every day | ORAL | Status: DC

## 2015-01-23 NOTE — Telephone Encounter (Signed)
New Message  Pt stated she wanted her metoprolol changed to [facility]    *STAT* If patient is at the pharmacy, call can be transferred to refill team.   1. Which medications need to be refilled? (please list name of each medication and dose if known) metoprolol 50 mg  2. Which pharmacy/location (including street and city if local pharmacy) is medication to be sent to? [facility]  3. Do they need a 30 day or 90 day supply? 90 day

## 2015-01-23 NOTE — Telephone Encounter (Signed)
Switched refills to [facility] as requested, called and cancelled all refills on file with [facility]. Spoke with joe, he expressed understanding & cancelled the refills on file.

## 2015-01-28 ENCOUNTER — Ambulatory Visit (INDEPENDENT_AMBULATORY_CARE_PROVIDER_SITE_OTHER): Payer: Medicare Other | Admitting: *Deleted

## 2015-01-28 DIAGNOSIS — I4891 Unspecified atrial fibrillation: Secondary | ICD-10-CM | POA: Diagnosis not present

## 2015-01-28 DIAGNOSIS — Z5181 Encounter for therapeutic drug level monitoring: Secondary | ICD-10-CM

## 2015-01-28 DIAGNOSIS — I480 Paroxysmal atrial fibrillation: Secondary | ICD-10-CM | POA: Diagnosis not present

## 2015-01-28 LAB — POCT INR: INR: 1.6

## 2015-01-29 ENCOUNTER — Ambulatory Visit (INDEPENDENT_AMBULATORY_CARE_PROVIDER_SITE_OTHER): Payer: Medicare Other | Admitting: *Deleted

## 2015-01-29 ENCOUNTER — Telehealth: Payer: Self-pay | Admitting: Cardiology

## 2015-01-29 DIAGNOSIS — I495 Sick sinus syndrome: Secondary | ICD-10-CM

## 2015-01-29 NOTE — Telephone Encounter (Signed)
LMOVM reminding pt to send remote transmission.   

## 2015-01-30 NOTE — Progress Notes (Signed)
Remote pacemaker transmission.   

## 2015-02-04 ENCOUNTER — Telehealth: Payer: Self-pay | Admitting: Cardiology

## 2015-02-04 ENCOUNTER — Other Ambulatory Visit: Payer: Self-pay | Admitting: *Deleted

## 2015-02-04 LAB — CUP PACEART REMOTE DEVICE CHECK
Brady Statistic AP VS Percent: 93.3 %
Brady Statistic AS VP Percent: 2.34 %
Brady Statistic AS VS Percent: 4.01 %
Implantable Lead Implant Date: 20101109
Implantable Lead Implant Date: 20101109
Implantable Lead Location: 753859
Implantable Lead Model: 4469
Implantable Lead Model: 4470
Implantable Lead Serial Number: 657967
Lead Channel Impedance Value: 424 Ohm
Lead Channel Impedance Value: 496 Ohm
Lead Channel Sensing Intrinsic Amplitude: 0.884 mV
Lead Channel Sensing Intrinsic Amplitude: 9.707 mV
Lead Channel Setting Pacing Amplitude: 2.5 V
Lead Channel Setting Pacing Pulse Width: 0.4 ms
MDC IDC LEAD LOCATION: 753860
MDC IDC LEAD SERIAL: 535369
MDC IDC MSMT BATTERY VOLTAGE: 2.97 V
MDC IDC SESS DTM: 20161221211420
MDC IDC SET LEADCHNL RA PACING AMPLITUDE: 2 V
MDC IDC SET LEADCHNL RV SENSING SENSITIVITY: 0.9 mV
MDC IDC STAT BRADY AP VP PERCENT: 0.35 %
MDC IDC STAT BRADY RA PERCENT PACED: 93.65 %
MDC IDC STAT BRADY RV PERCENT PACED: 2.69 %

## 2015-02-04 MED ORDER — DILTIAZEM HCL ER COATED BEADS 360 MG PO CP24: 360.0000 mg | ORAL_CAPSULE | Freq: Every day | ORAL | Status: DC

## 2015-02-04 NOTE — Telephone Encounter (Signed)
Pt needs refill of Cardizem wants 90 day instead of 30 day-[facility]  pt [phone]

## 2015-02-04 NOTE — Addendum Note (Signed)
Addended by: Domenica Reamer R on: 02/04/2015 02:19 PM   Modules accepted: Orders

## 2015-02-04 NOTE — Addendum Note (Signed)
Addended by: Domenica Reamer R on: 02/04/2015 02:41 PM   Modules accepted: Orders

## 2015-02-06 ENCOUNTER — Encounter: Payer: Self-pay | Admitting: Cardiology

## 2015-02-11 ENCOUNTER — Ambulatory Visit (INDEPENDENT_AMBULATORY_CARE_PROVIDER_SITE_OTHER): Payer: Medicare Other | Admitting: *Deleted

## 2015-02-11 DIAGNOSIS — I480 Paroxysmal atrial fibrillation: Secondary | ICD-10-CM | POA: Diagnosis not present

## 2015-02-11 DIAGNOSIS — I4891 Unspecified atrial fibrillation: Secondary | ICD-10-CM | POA: Diagnosis not present

## 2015-02-11 DIAGNOSIS — Z5181 Encounter for therapeutic drug level monitoring: Secondary | ICD-10-CM | POA: Diagnosis not present

## 2015-02-11 LAB — POCT INR: INR: 1.6

## 2015-02-13 ENCOUNTER — Telehealth: Payer: Self-pay | Admitting: Cardiology

## 2015-02-13 NOTE — Telephone Encounter (Signed)
Called pt and pt stated that she was looking at the wrong medication and that she did not need a refill on the metoprolol. I advised the pt that if she has any other problems, questions or concerns to call the office. Pt verbalized understanding.

## 2015-02-13 NOTE — Telephone Encounter (Signed)
New message       *STAT* If patient is at the pharmacy, call can be transferred to refill team.   1. Which medications need to be refilled? (please list name of each medication and dose if known)  Metoprolol 100mg  2. Which pharmacy/location (including street and city if local pharmacy) is medication to be sent to?[facility] 3. Do they need a 30 day or 90 day supply? 90 day supply Pt states 50mg  was recently called in but she takes 100mg 

## 2015-02-22 ENCOUNTER — Telehealth: Payer: Self-pay | Admitting: Internal Medicine

## 2015-02-22 NOTE — Telephone Encounter (Signed)
Patient was called. She said that she was instructed by Dr. Mare Ferrari that when she goes into atrial fibrillation, she was told to take an extra toprol xl and a higher dose of amiodarone. She said that her INR was lower 2 weeks ago. She missed her cardiazem one night. She was advised to go to ED if she is concerned about getting her INR checked which was 1.6 on 1/3. Her warfarin dose was increased at that time. She was also advised to take another dose of toprol xl 50 mg to have her HR improved. She denied any infection and no chances of her dehydrated.

## 2015-02-23 NOTE — Telephone Encounter (Signed)
Agree with advice given

## 2015-02-24 ENCOUNTER — Telehealth: Payer: Self-pay | Admitting: Cardiology

## 2015-02-24 NOTE — Telephone Encounter (Signed)
Increase amiodarone to a full tablet for the next 4 weeks then if no further problems can decrease back to half tab.

## 2015-02-24 NOTE — Telephone Encounter (Signed)
New message     Pt was in afib all weekend.  She is out of afib now.  Should she change any of her medications?  Please call before 9am or after 1pm

## 2015-02-24 NOTE — Telephone Encounter (Signed)
Will forward to  Dr. Brackbill for review 

## 2015-02-24 NOTE — Telephone Encounter (Signed)
Advised patient, verbalized understanding  

## 2015-02-25 ENCOUNTER — Ambulatory Visit (INDEPENDENT_AMBULATORY_CARE_PROVIDER_SITE_OTHER): Payer: Medicare Other | Admitting: *Deleted

## 2015-02-25 DIAGNOSIS — I480 Paroxysmal atrial fibrillation: Secondary | ICD-10-CM | POA: Diagnosis not present

## 2015-02-25 DIAGNOSIS — Z5181 Encounter for therapeutic drug level monitoring: Secondary | ICD-10-CM | POA: Diagnosis not present

## 2015-02-25 DIAGNOSIS — I4891 Unspecified atrial fibrillation: Secondary | ICD-10-CM

## 2015-02-25 LAB — POCT INR: INR: 2.1

## 2015-02-26 ENCOUNTER — Telehealth: Payer: Self-pay | Admitting: Cardiology

## 2015-02-26 NOTE — Telephone Encounter (Signed)
Patient aware of appointment

## 2015-02-26 NOTE — Telephone Encounter (Signed)
Patient having issues with going in and out of Afib Recent med change does not seem to be helping  Dr. Mare Ferrari recommended following up with Afib clinic  Patient scheduled for tomorrow, ok per [facility]

## 2015-02-26 NOTE — Telephone Encounter (Signed)
New message     Patient calling C/O back in Afib - increase medication is not working- sat & sun

## 2015-02-27 ENCOUNTER — Ambulatory Visit (HOSPITAL_COMMUNITY)
Admission: RE | Admit: 2015-02-27 | Discharge: 2015-02-27 | Disposition: A | Discharge status: Home / Self Care | Payer: Medicare Other | Source: Ambulatory Visit | Attending: Nurse Practitioner | Admitting: Nurse Practitioner

## 2015-02-27 ENCOUNTER — Encounter (HOSPITAL_COMMUNITY): Payer: Self-pay | Admitting: Nurse Practitioner

## 2015-02-27 ENCOUNTER — Inpatient Hospital Stay (HOSPITAL_COMMUNITY): Admission: RE | Admit: 2015-02-27 | Payer: Medicare Other | Source: Ambulatory Visit | Admitting: Nurse Practitioner

## 2015-02-27 VITALS — BP 102/60 | HR 62 | Ht 61.0 in | Wt 125.0 lb

## 2015-02-27 DIAGNOSIS — Z8249 Family history of ischemic heart disease and other diseases of the circulatory system: Secondary | ICD-10-CM | POA: Insufficient documentation

## 2015-02-27 DIAGNOSIS — I10 Essential (primary) hypertension: Secondary | ICD-10-CM | POA: Diagnosis not present

## 2015-02-27 DIAGNOSIS — K589 Irritable bowel syndrome without diarrhea: Secondary | ICD-10-CM | POA: Diagnosis not present

## 2015-02-27 DIAGNOSIS — E039 Hypothyroidism, unspecified: Secondary | ICD-10-CM | POA: Diagnosis not present

## 2015-02-27 DIAGNOSIS — I480 Paroxysmal atrial fibrillation: Secondary | ICD-10-CM

## 2015-02-27 DIAGNOSIS — Z87891 Personal history of nicotine dependence: Secondary | ICD-10-CM | POA: Insufficient documentation

## 2015-02-27 DIAGNOSIS — Z7901 Long term (current) use of anticoagulants: Secondary | ICD-10-CM | POA: Insufficient documentation

## 2015-02-27 DIAGNOSIS — Z950 Presence of cardiac pacemaker: Secondary | ICD-10-CM | POA: Diagnosis not present

## 2015-02-27 DIAGNOSIS — Z79899 Other long term (current) drug therapy: Secondary | ICD-10-CM | POA: Insufficient documentation

## 2015-02-27 NOTE — Progress Notes (Signed)
Patient ID: Kathryn Gibson, female   DOB: [birth year], 82 y.o.   MRN: [id number]    Primary Care Physician: Tivis Ringer, MD Referring Physician: Dr. Jordan Hawks is a 82 y.o. female with a h/o PPM, PAF, that is here for issues with increased afib occurrence since last Saturday. It makes her feel week when she is in it and she misses her exercise class. She hasn't been to exercise in over a week and she feels that she is getting deconditioned in this short period of time.  She has called Dr. Sherryl Barters office and he had suggested to increase amiodarone to 200 mg a day, from 100 mg a day, and she has done this since Sunday. as she was turning into the parking lot for the clinic she went back into normal rhythm.  Device interrogated today with good battery life,and  confirms paroxysmal afib varying from a few seconds up to 55 mins at at time but more frequent episodes since 12/21. Pacer interrogation 12/21 had shown afib burden at 6 %.  Today, she denies symptoms of palpitations, chest pain, shortness of breath, orthopnea, PND, lower extremity edema, dizziness, presyncope, syncope, or neurologic sequela. The patient is tolerating medications without difficulties and is otherwise without complaint today.   Past Medical History  Diagnosis Date  . Atrial fibrillation ([place])   . Hypertension   . Hypothyroidism   . Irritable bowel syndrome (IBS)   . Constipation   . Colonic polyp     adenomatous  . Diverticulosis   . Bradycardia     s/p PPM  . Hemorrhoids   . Arthritis   . Cataract    Past Surgical History  Procedure Laterality Date  . Pacemaker insertion  2010    implanted by Dr Doreatha Lew (MDT)  . Colonoscopy    . Nasal endoscopy with epistaxis control N/A 07/14/2014    Procedure: NASAL ENDOSCOPY WITH EPISTAXIS CONTROL;  Surgeon: Ruby Cola, MD;  Location: [facility] OR;  Service: ENT;  Laterality: N/A;  . Radiology with anesthesia N/A 07/14/2014    Procedure: RADIOLOGY WITH  ANESTHESIA;  Surgeon: Medication Radiologist, MD;  Location: [place];  Service: Radiology;  Laterality: N/A;  . Hematoma evacuation Right 07/15/2014    Procedure: EVACUATION Right Groin Hematoma;  Surgeon: Rosetta Posner, MD;  Location: [place];  Service: Vascular;  Laterality: Right;  . Nasal endoscopy with epistaxis control Bilateral 07/15/2014    Procedure: NASAL ENDOSCOPY WITH EPISTAXIS CONTROL;  Surgeon: Ruby Cola, MD;  Location: [place];  Service: ENT;  Laterality: Bilateral;    Current Outpatient Prescriptions  Medication Sig Dispense Refill  . acetaminophen (TYLENOL) 500 MG tablet Take 500-1,000 mg by mouth every 6 (six) hours as needed for moderate pain or headache.    Marland Kitchen amiodarone (PACERONE) 200 MG tablet Take 200 mg by mouth daily.    . Ascorbic Acid (VITAMIN C) 1000 MG tablet Take 1,000 mg by mouth daily.      . Calcium-Vitamin D (CALTRATE 600 PLUS-VIT D PO) Take 1 tablet by mouth daily.    . cholecalciferol (VITAMIN D) 1000 UNITS tablet Take 1,000 Units by mouth daily.      Marland Kitchen diltiazem (CARDIZEM CD) 360 MG 24 hr capsule Take 1 capsule (360 mg total) by mouth daily. 90 capsule 3  . LYSINE PO Take 1-2 tablets by mouth daily as needed (fever blister).    . metoprolol succinate (TOPROL-XL) 50 MG 24 hr tablet Take 1 tablet (50 mg total) by  mouth daily. 90 tablet 2  . Multiple Vitamin (MULTIVITAMIN) capsule Take 1 capsule by mouth daily.      . mupirocin ointment (BACTROBAN) 2 % Apply 1 application topically 3 (three) times daily.   0  . Omega-3 Fatty Acids (FISH OIL) 1200 MG CAPS Take 1 capsule by mouth daily. Mega Red.    Vladimir Faster Glycol-Propyl Glycol (SYSTANE OP) Place 1-2 drops into both eyes daily.     . polyethylene glycol powder (GLYCOLAX/MIRALAX) powder Take 2 Containers by mouth daily.   0  . Probiotic Product (ALIGN PO) Take 1 capsule by mouth daily.     Marland Kitchen SYNTHROID 75 MCG tablet Take 50 mcg by mouth daily before breakfast.  2  . temazepam (RESTORIL) 30 MG capsule Take 30 mg by  mouth at bedtime.     Marland Kitchen warfarin (COUMADIN) 5 MG tablet Take 5 mg by mouth as directed. As directed by [facility]     No current facility-administered medications for this encounter.    No Known Allergies  Social History   Social History  . Marital Status: Widowed    Spouse Name: N/A  . Number of Children: 3  . Years of Education: N/A   Occupational History  . retired    Social History Main Topics  . Smoking status: Former Smoker    Types: Cigarettes    Quit date: 02/09/1980  . Smokeless tobacco: Never Used  . Alcohol Use: 8.4 oz/week    14 Glasses of wine per week  . Drug Use: No  . Sexual Activity: Not on file   Other Topics Concern  . Not on file   Social History Narrative    Family History  Problem Relation Age of Onset  . Heart failure Mother   . Heart attack Father   . Colon cancer Neg Hx   . Diabetes Paternal Grandfather   . Kidney disease Neg Hx   . Liver disease Neg Hx   . Esophageal cancer Neg Hx   . Heart disease Brother   . Lung cancer Son   . Stroke Neg Hx   . Hypertension Mother     ROS- All systems are reviewed and negative except as per the HPI above  Physical Exam: Filed Vitals:   02/27/15 1101  BP: 102/60  Pulse: 62  Height: 5\' 1"  (1.549 m)  Weight: 125 lb (56.7 kg)    GEN- The patient is well appearing, alert and oriented x 3 today.   Head- normocephalic, atraumatic Eyes-  Sclera clear, conjunctiva pink Ears- hearing intact Oropharynx- clear Neck- supple, no JVP Lymph- no cervical lymphadenopathy Lungs- Clear to ausculation bilaterally, normal work of breathing Heart- Regular rate and rhythm, no murmurs, rubs or gallops, PMI not laterally displaced GI- soft, NT, ND, + BS Extremities- no clubbing, cyanosis, or edema MS- no significant deformity or atrophy Skin- no rash or lesion Psych- euthymic mood, full affect Neuro- strength and sensation are intact  EKG-Atrial paced rhythm with prolonged AV conduction v rate 62  bpm, Pr int 246 ms, qrs int 90 ms, qtc 491 ms. Epic records reviewed Paceart reviewed Blanking period adjusted by Tomi Bamberger, Medtr[employer] rep, so that pt would not feel the mode changes  Assessment and Plan: 1. PAF Has had more afib burden over the last week Continue on amiodarone 200 mg a day Continue with metoprolol with extra doses as need  Amiodarone level Continue with warfarin  Will call pt with amiodarone level when back with any adjustment /f/u visit  as needed  Geroge Baseman. Jayanth Szczesniak, [facility] [address] [place], [place] [address] [phone]

## 2015-03-03 LAB — AMIODARONE LEVEL
AMIODARONE LVL: 2.4 ug/mL (ref 1.0–2.5)
N-DESETHYL-AMIODARONE: 0.8 ug/mL — AB (ref 1.0–2.5)

## 2015-03-07 ENCOUNTER — Encounter: Payer: Self-pay | Admitting: *Deleted

## 2015-03-10 ENCOUNTER — Encounter: Payer: Self-pay | Admitting: Cardiology

## 2015-03-18 ENCOUNTER — Ambulatory Visit (INDEPENDENT_AMBULATORY_CARE_PROVIDER_SITE_OTHER): Payer: Medicare Other | Admitting: *Deleted

## 2015-03-18 DIAGNOSIS — I480 Paroxysmal atrial fibrillation: Secondary | ICD-10-CM | POA: Diagnosis not present

## 2015-03-18 DIAGNOSIS — Z5181 Encounter for therapeutic drug level monitoring: Secondary | ICD-10-CM | POA: Diagnosis not present

## 2015-03-18 DIAGNOSIS — I4891 Unspecified atrial fibrillation: Secondary | ICD-10-CM | POA: Diagnosis not present

## 2015-03-18 LAB — POCT INR: INR: 2.1

## 2015-03-31 ENCOUNTER — Ambulatory Visit (INDEPENDENT_AMBULATORY_CARE_PROVIDER_SITE_OTHER): Payer: Medicare Other | Admitting: Internal Medicine

## 2015-03-31 ENCOUNTER — Encounter: Payer: Self-pay | Admitting: Internal Medicine

## 2015-03-31 ENCOUNTER — Telehealth: Payer: Self-pay | Admitting: Internal Medicine

## 2015-03-31 VITALS — BP 128/68 | HR 65 | Ht 61.0 in | Wt 124.6 lb

## 2015-03-31 DIAGNOSIS — I480 Paroxysmal atrial fibrillation: Secondary | ICD-10-CM | POA: Diagnosis not present

## 2015-03-31 DIAGNOSIS — I10 Essential (primary) hypertension: Secondary | ICD-10-CM | POA: Diagnosis not present

## 2015-03-31 DIAGNOSIS — I495 Sick sinus syndrome: Secondary | ICD-10-CM | POA: Diagnosis not present

## 2015-03-31 LAB — CUP PACEART INCLINIC DEVICE CHECK
Battery Voltage: 2.97 V
Brady Statistic AP VP Percent: 1.61 %
Brady Statistic AS VS Percent: 3.41 %
Brady Statistic RV Percent Paced: 4.62 %
Date Time Interrogation Session: 20170220122956
Implantable Lead Location: 753859
Implantable Lead Location: 753860
Implantable Lead Serial Number: 535369
Lead Channel Impedance Value: 512 Ohm
Lead Channel Sensing Intrinsic Amplitude: 0.972 mV
Lead Channel Setting Pacing Amplitude: 2 V
Lead Channel Setting Pacing Pulse Width: 0.4 ms
MDC IDC LEAD IMPLANT DT: 20101109
MDC IDC LEAD IMPLANT DT: 20101109
MDC IDC LEAD SERIAL: 657967
MDC IDC MSMT LEADCHNL RV IMPEDANCE VALUE: 432 Ohm
MDC IDC MSMT LEADCHNL RV SENSING INTR AMPL: 11.715 mV
MDC IDC SET LEADCHNL RV PACING AMPLITUDE: 2.5 V
MDC IDC SET LEADCHNL RV SENSING SENSITIVITY: 0.9 mV
MDC IDC STAT BRADY AP VS PERCENT: 91.97 %
MDC IDC STAT BRADY AS VP PERCENT: 3.02 %
MDC IDC STAT BRADY RA PERCENT PACED: 93.57 %

## 2015-03-31 NOTE — Patient Instructions (Addendum)
Medication Instructions:  Your physician recommends that you continue on your current medications as directed. Please refer to the Current Medication list given to you today.   Labwork:  Your physician recommends that you return for lab work 2 weeks prior to the procedure on____________  Will need weekly INR's for 4 weeks prior to the ablation  Testing/Procedures: Your physician has requested that you have an echocardiogram. Echocardiography is a painless test that uses sound waves to create images of your heart. It provides your doctor with information about the size and shape of your heart and how well your heart's chambers and valves are working. This procedure takes approximately one hour. There are no restrictions for this procedure.  Your physician has recommended that you have an ablation. Catheter ablation is a medical procedure used to treat some cardiac arrhythmias (irregular heartbeats). During catheter ablation, a long, thin, flexible tube is put into a blood vessel in your groin (upper thigh), or neck. This tube is called an ablation catheter. It is then guided to your heart through the blood vessel. Radio frequency waves destroy small areas of heart tissue where abnormal heartbeats may cause an arrhythmia to start. Please see the instruction sheet given to you today.--Call Janan Halter, RN to schedule--[phone]  Please arrive at the [facility] Entrance of [facility] on ___________ at _____________.  Do not eat or drink after midnight the night prior to your procedure.  Do not take any medications the morning of your procedure.   Your physician has requested that you have cardiac CT. Cardiac computed tomography (CT) is a painless test that uses an x-ray machine to take clear, detailed pictures of your heart. For further information please visit [email or web address]. Please follow instruction sheet as given.---this will need to be scheduled one week prior to the ablation.        Follow-Up: Your physician recommends that you schedule a follow-up appointment 4 weeks post procedure with Roderic Palau, NP in afib clinic and 3 months with Dr Rayann Heman after procedure   Any Other Special Instructions Will Be Listed Below (If Applicable).     If you need a refill on your cardiac medications before your next appointment, please call your pharmacy.

## 2015-03-31 NOTE — Telephone Encounter (Signed)
New message     Pt is calling to discuss date and time for an ablation.  Before discussing this, she need more information.  Will she need to go to rehab after ablation? Will she need someone to be with her for days after ablation?  Please call.

## 2015-04-01 NOTE — Progress Notes (Addendum)
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  Since last being seen in our clinic, the patient reports doing very well. She remains very active at [employer].  She exercises regularly. Unfortunately, she has had increasing frequency and duration of atrial fibrillation.  She reports palpitations and feels washed out and fatigued.  Episodes occur every few days and last up to 24 hours.  Today, she denies symptoms of palpitations, chest pain, shortness of breath,  lower extremity edema, dizziness, presyncope, or syncope.  The patient is otherwise without complaint today.   Past Medical History  Diagnosis Date  . Atrial fibrillation ([place])   . Hypertension   . Hypothyroidism   . Irritable bowel syndrome (IBS)   . Constipation   . Colonic polyp     adenomatous  . Diverticulosis   . Bradycardia     s/p PPM  . Hemorrhoids   . Arthritis   . Cataract    Past Surgical History  Procedure Laterality Date  . Pacemaker insertion  2010    implanted by Dr Doreatha Lew (MDT)  . Colonoscopy    . Nasal endoscopy with epistaxis control N/A 07/14/2014    Procedure: NASAL ENDOSCOPY WITH EPISTAXIS CONTROL;  Surgeon: Ruby Cola, MD;  Location: [facility];  Service: ENT;  Laterality: N/A;  . Radiology with anesthesia N/A 07/14/2014    Procedure: RADIOLOGY WITH ANESTHESIA;  Surgeon: Medication Radiologist, MD;  Location: [place];  Service: Radiology;  Laterality: N/A;  . Hematoma evacuation Right 07/15/2014    Procedure: EVACUATION Right Groin Hematoma;  Surgeon: Rosetta Posner, MD;  Location: [place];  Service: Vascular;  Laterality: Right;  . Nasal endoscopy with epistaxis control Bilateral 07/15/2014    Procedure: NASAL ENDOSCOPY WITH EPISTAXIS CONTROL;  Surgeon: Ruby Cola, MD;  Location: [place];  Service: ENT;  Laterality: Bilateral;    Current Outpatient Prescriptions  Medication Sig Dispense Refill  .  acetaminophen (TYLENOL) 500 MG tablet Take 500-1,000 mg by mouth every 6 (six) hours as needed for moderate pain or headache.    Marland Kitchen amiodarone (PACERONE) 200 MG tablet Take 100 mg by mouth daily.     . Ascorbic Acid (VITAMIN C) 1000 MG tablet Take 1,000 mg by mouth daily.      . Calcium-Vitamin D (CALTRATE 600 PLUS-VIT D PO) Take 1 tablet by mouth daily.    . cholecalciferol (VITAMIN D) 1000 UNITS tablet Take 1,000 Units by mouth daily.      Marland Kitchen diltiazem (CARDIZEM CD) 360 MG 24 hr capsule Take 1 capsule (360 mg total) by mouth daily. 90 capsule 3  . LYSINE PO Take 1-2 tablets by mouth daily as needed (fever blister).    . metoprolol succinate (TOPROL-XL) 50 MG 24 hr tablet Take 1 tablet (50 mg total) by mouth daily. 90 tablet 2  . Multiple Vitamin (MULTIVITAMIN) capsule Take 1 capsule by mouth daily.      . mupirocin ointment (BACTROBAN) 2 % Apply 1 application topically 3 (three) times daily.   0  . Omega-3 Fatty Acids (FISH OIL) 1200 MG CAPS Take 1 capsule by mouth daily. Mega Red.    Vladimir Faster Glycol-Propyl Glycol (SYSTANE OP) Place 1-2 drops into both eyes daily.     . polyethylene glycol powder (GLYCOLAX/MIRALAX) powder Take 2 Containers by mouth daily.   0  . Probiotic Product (ALIGN PO) Take 1 capsule by mouth daily.     Marland Kitchen  SYNTHROID 50 MCG tablet as directed.  7  . SYNTHROID 75 MCG tablet Take 50-75 mcg by mouth daily before breakfast. Alternates between 50 mcg and 75 mcg daily  2  . temazepam (RESTORIL) 30 MG capsule Take 30 mg by mouth at bedtime.     Marland Kitchen warfarin (COUMADIN) 5 MG tablet Take 5 mg by mouth as directed. As directed by [facility]     No current facility-administered medications for this visit.    Physical Exam: Filed Vitals:   03/31/15 1137  BP: 128/68  Pulse: 65  Height: 5\' 1"  (1.549 m)  Weight: 124 lb 9.6 oz (56.518 kg)    GEN- The patient is well appearing, alert and oriented x 3 today.   Head- normocephalic, atraumatic Eyes-  Sclera clear, conjunctiva  pink Ears- hearing intact Oropharynx- clear Lungs- Clear to ausculation bilaterally, normal work of breathing Chest- pacemaker pocket is well healed Heart- Regular rate and rhythm, no murmurs, rubs or gallops, PMI not laterally displaced GI- soft, NT, ND, + BS Extremities- no clubbing, cyanosis, or edema  Pacemaker interrogation- reviewed in detail today,  See PACEART report  Assessment and Plan:  1. Sick sinus syndrome Normal pacemaker function See Pace Art report No changes today  2. afib Recently increased despite medical therapy with amiodarone.  Recent amiodarone level does not suggest that we could increase her dose further. apprporiately anticoagulated with coumadin chads2vasc score is at least 4. Therapeutic strategies for afib including medicine and ablation were discussed in detail with the patient today. Risk, benefits, and alternatives to EP study and radiofrequency ablation for afib were also discussed in detail today. These risks include but are not limited to stroke, bleeding, vascular damage, tamponade, perforation, damage to the esophagus, lungs, and other structures, pulmonary vein stenosis, worsening renal function, and death. The patient understands these risk and wishes to proceed.  We will therefore proceed with catheter ablation at the next available time. Obtain 2D echo to evaluate for structural heart changes.  Will obtain cardiac CT prior to ablation to evaluate for LA thrombus.  3. HTN Stable No change required today   Thompson Grayer MD, [facility] 04/01/2015 10:42 PM

## 2015-04-07 ENCOUNTER — Encounter: Payer: Self-pay | Admitting: Internal Medicine

## 2015-04-07 DIAGNOSIS — J209 Acute bronchitis, unspecified: Secondary | ICD-10-CM | POA: Diagnosis not present

## 2015-04-07 DIAGNOSIS — Z7901 Long term (current) use of anticoagulants: Secondary | ICD-10-CM | POA: Diagnosis not present

## 2015-04-07 DIAGNOSIS — Z6823 Body mass index (BMI) 23.0-23.9, adult: Secondary | ICD-10-CM | POA: Diagnosis not present

## 2015-04-07 DIAGNOSIS — R05 Cough: Secondary | ICD-10-CM | POA: Diagnosis not present

## 2015-04-07 DIAGNOSIS — R509 Fever, unspecified: Secondary | ICD-10-CM | POA: Diagnosis not present

## 2015-04-07 NOTE — Telephone Encounter (Signed)
Returned call to patient and she was questioning how long the actual ablation took.  A friend of hers had it done at [facility] and it took 7 hours.  This scared her a bit.  I reassured her that most of the time it was around 3 hours but could be longer.

## 2015-04-07 NOTE — Telephone Encounter (Signed)
This encounter was created in error - please disregard.

## 2015-04-07 NOTE — Telephone Encounter (Signed)
New message      Talk to the nurse.  Pt has questions regarding her upcoming ablation

## 2015-04-07 NOTE — Telephone Encounter (Signed)
Call Documentation      Kathryn Gibson at 04/07/2015 1:35 PM     Status: Signed       Expand All Collapse All   New message      Talk to the nurse. Pt has questions regarding her upcoming ablation

## 2015-04-10 ENCOUNTER — Encounter: Payer: Self-pay | Admitting: Internal Medicine

## 2015-04-11 ENCOUNTER — Ambulatory Visit: Payer: Medicare Other | Admitting: Nurse Practitioner

## 2015-04-11 ENCOUNTER — Other Ambulatory Visit (HOSPITAL_COMMUNITY): Payer: Medicare Other

## 2015-04-14 ENCOUNTER — Encounter: Payer: Self-pay | Admitting: Internal Medicine

## 2015-04-14 DIAGNOSIS — T148 Other injury of unspecified body region: Secondary | ICD-10-CM | POA: Diagnosis not present

## 2015-04-14 DIAGNOSIS — K5909 Other constipation: Secondary | ICD-10-CM | POA: Diagnosis not present

## 2015-04-14 DIAGNOSIS — Z7901 Long term (current) use of anticoagulants: Secondary | ICD-10-CM | POA: Diagnosis not present

## 2015-04-14 DIAGNOSIS — J208 Acute bronchitis due to other specified organisms: Secondary | ICD-10-CM | POA: Diagnosis not present

## 2015-04-14 DIAGNOSIS — Z6823 Body mass index (BMI) 23.0-23.9, adult: Secondary | ICD-10-CM | POA: Diagnosis not present

## 2015-04-14 NOTE — Telephone Encounter (Signed)
Call Documentation      Earnestine Mealing at 04/14/2015 8:23 AM     Status: Signed       Expand All Collapse All   New message      Pt says she is due to have an echo tomorrow because of a procedure she is having. She states that she is sick and unable to lie down for the echo. Will she have to cancel her procedure?

## 2015-04-14 NOTE — Telephone Encounter (Signed)
Spoke with patient and we will move her echo to the end of next week.  Will not cancel her procedure at this point.  Kathryn Gibson will call her to reschedule today.   I will have her labs done for the procedure the same day.

## 2015-04-14 NOTE — Telephone Encounter (Signed)
New message      Pt says she is due to have an echo tomorrow because of a procedure she is having. She states that she is sick and unable to lie down for the echo.  Will she have to cancel her procedure?

## 2015-04-14 NOTE — Telephone Encounter (Signed)
This encounter was created in error - please disregard.

## 2015-04-15 ENCOUNTER — Ambulatory Visit (INDEPENDENT_AMBULATORY_CARE_PROVIDER_SITE_OTHER): Payer: Medicare Other | Admitting: *Deleted

## 2015-04-15 ENCOUNTER — Other Ambulatory Visit (HOSPITAL_COMMUNITY): Payer: Medicare Other

## 2015-04-15 DIAGNOSIS — I480 Paroxysmal atrial fibrillation: Secondary | ICD-10-CM | POA: Diagnosis not present

## 2015-04-15 DIAGNOSIS — I4891 Unspecified atrial fibrillation: Secondary | ICD-10-CM | POA: Diagnosis not present

## 2015-04-15 DIAGNOSIS — Z5181 Encounter for therapeutic drug level monitoring: Secondary | ICD-10-CM

## 2015-04-15 LAB — POCT INR: INR: 4

## 2015-04-16 ENCOUNTER — Telehealth: Payer: Self-pay | Admitting: *Deleted

## 2015-04-16 NOTE — Telephone Encounter (Signed)
Pt calls to inform us that she started Prednisone yesterday. She took two 20mg s tablets, due to take same dosage today then it goes to 20mg s for 4 days, thus regimen ends on Sunday.

## 2015-04-17 DIAGNOSIS — M9903 Segmental and somatic dysfunction of lumbar region: Secondary | ICD-10-CM | POA: Diagnosis not present

## 2015-04-17 DIAGNOSIS — M5116 Intervertebral disc disorders with radiculopathy, lumbar region: Secondary | ICD-10-CM | POA: Diagnosis not present

## 2015-04-18 ENCOUNTER — Ambulatory Visit (INDEPENDENT_AMBULATORY_CARE_PROVIDER_SITE_OTHER): Payer: Medicare Other | Admitting: Pharmacist

## 2015-04-18 DIAGNOSIS — Z5181 Encounter for therapeutic drug level monitoring: Secondary | ICD-10-CM

## 2015-04-18 DIAGNOSIS — I480 Paroxysmal atrial fibrillation: Secondary | ICD-10-CM

## 2015-04-18 DIAGNOSIS — I4891 Unspecified atrial fibrillation: Secondary | ICD-10-CM

## 2015-04-18 LAB — POCT INR: INR: 2.7

## 2015-04-21 ENCOUNTER — Telehealth: Payer: Self-pay | Admitting: *Deleted

## 2015-04-21 DIAGNOSIS — M5116 Intervertebral disc disorders with radiculopathy, lumbar region: Secondary | ICD-10-CM | POA: Diagnosis not present

## 2015-04-21 DIAGNOSIS — M9903 Segmental and somatic dysfunction of lumbar region: Secondary | ICD-10-CM | POA: Diagnosis not present

## 2015-04-21 NOTE — Telephone Encounter (Signed)
Patient called to inform us that she missed a dose and needs to know what to do to get back on schedule with her dosing.  Advised to take 1.5 tablets tonight and then resume her normal Coumadin schedule; she is pending an ablation per chart & She verbalized understanding.

## 2015-04-22 DIAGNOSIS — M5116 Intervertebral disc disorders with radiculopathy, lumbar region: Secondary | ICD-10-CM | POA: Diagnosis not present

## 2015-04-22 DIAGNOSIS — M9903 Segmental and somatic dysfunction of lumbar region: Secondary | ICD-10-CM | POA: Diagnosis not present

## 2015-04-23 ENCOUNTER — Ambulatory Visit (INDEPENDENT_AMBULATORY_CARE_PROVIDER_SITE_OTHER): Payer: Medicare Other | Admitting: Nurse Practitioner

## 2015-04-23 ENCOUNTER — Encounter: Payer: Self-pay | Admitting: Nurse Practitioner

## 2015-04-23 ENCOUNTER — Ambulatory Visit (INDEPENDENT_AMBULATORY_CARE_PROVIDER_SITE_OTHER): Payer: Medicare Other | Admitting: *Deleted

## 2015-04-23 VITALS — BP 120/60 | HR 60 | Ht 60.0 in | Wt 124.4 lb

## 2015-04-23 DIAGNOSIS — I4891 Unspecified atrial fibrillation: Secondary | ICD-10-CM

## 2015-04-23 DIAGNOSIS — I480 Paroxysmal atrial fibrillation: Secondary | ICD-10-CM

## 2015-04-23 DIAGNOSIS — Z7901 Long term (current) use of anticoagulants: Secondary | ICD-10-CM

## 2015-04-23 DIAGNOSIS — Z79899 Other long term (current) drug therapy: Secondary | ICD-10-CM | POA: Diagnosis not present

## 2015-04-23 DIAGNOSIS — Z5181 Encounter for therapeutic drug level monitoring: Secondary | ICD-10-CM

## 2015-04-23 LAB — POCT INR: INR: 4

## 2015-04-23 NOTE — Patient Instructions (Addendum)
We will be checking the following labs today - NONE   Medication Instructions:    Continue with your current medicines.     Testing/Procedures To Be Arranged:  N/A  Follow-Up:   See Dr. Oval Linsey in 4 months.     Other Special Instructions:   I will have Claiborne Billings - Dr. Jackalyn Lombard nurse - call about rescheduling your tests/ablation    If you need a refill on your cardiac medications before your next appointment, please call your pharmacy.   Call the [place] office at [phone] if you have any questions, problems or concerns.

## 2015-04-23 NOTE — Progress Notes (Signed)
CARDIOLOGY OFFICE NOTE  Date:  04/23/2015    Kathryn Gibson Date of Birth: 06-20-1933 Medical Record [id number]  PCP:  Tivis Ringer, MD  Cardiologist:  Former patient of Dr. Sherryl Barters    Chief Complaint  Patient presents with  . Atrial Fibrillation    Follow up visit - former patient of Dr. Sherryl Barters. Now seeing Dr. Rayann Heman    History of Present Illness: Kathryn Gibson is a 82 y.o. female who presents today for a follow up visit. Former patient of Dr. Sherryl Barters.   She has a history of paroxysmal atrial fibrillation and tachybradycardia syndrome. She has a functioning pacemaker in place.She remains on amiodarone therapy. Other issues include HTN, hypothyroidism and IBS.  Last seen by Dr. Mare Ferrari back in September. Saw Dr. Rayann Heman last month - noted to be having more AF despite her current therapy. Not able to increase amiodarone. Ablation was discussed. She was to have CT and echo.   Comes back today. Here alone. CT scheduled for later this month. Was to have her echo today - she has cancelled - she has had the "crud" and now has twisted her back. Wants to postpone all studies.  Her URI keeps "coming and going" - on current therapy. She does not feel good. She has not exercised over the past 3 weeks. Says her "a fib has been so good". She has had close follow up with Dr. Dagmar Hait. Her labs are checked by PCP.   Past Medical History  Diagnosis Date  . Atrial fibrillation ([place])   . Hypertension   . Hypothyroidism   . Irritable bowel syndrome (IBS)   . Constipation   . Colonic polyp     adenomatous  . Diverticulosis   . Bradycardia     s/p PPM  . Hemorrhoids   . Arthritis   . Cataract     Past Surgical History  Procedure Laterality Date  . Pacemaker insertion  2010    implanted by Dr Doreatha Lew (MDT)  . Colonoscopy    . Nasal endoscopy with epistaxis control N/A 07/14/2014    Procedure: NASAL ENDOSCOPY WITH EPISTAXIS CONTROL;  Surgeon: Ruby Cola, MD;   Location: [facility];  Service: ENT;  Laterality: N/A;  . Radiology with anesthesia N/A 07/14/2014    Procedure: RADIOLOGY WITH ANESTHESIA;  Surgeon: Medication Radiologist, MD;  Location: [place];  Service: Radiology;  Laterality: N/A;  . Hematoma evacuation Right 07/15/2014    Procedure: EVACUATION Right Groin Hematoma;  Surgeon: Rosetta Posner, MD;  Location: [place];  Service: Vascular;  Laterality: Right;  . Nasal endoscopy with epistaxis control Bilateral 07/15/2014    Procedure: NASAL ENDOSCOPY WITH EPISTAXIS CONTROL;  Surgeon: Ruby Cola, MD;  Location: [place];  Service: ENT;  Laterality: Bilateral;     Medications: Current Outpatient Prescriptions  Medication Sig Dispense Refill  . acetaminophen (TYLENOL) 500 MG tablet Take 500-1,000 mg by mouth every 6 (six) hours as needed for moderate pain or headache.    Marland Kitchen amiodarone (PACERONE) 200 MG tablet Take 100 mg by mouth daily.     . Ascorbic Acid (VITAMIN C) 1000 MG tablet Take 1,000 mg by mouth daily.      . benzonatate (TESSALON) 100 MG capsule Take 100 mg by mouth 3 (three) times daily as needed for cough.    . Calcium-Vitamin D (CALTRATE 600 PLUS-VIT D PO) Take 1 tablet by mouth daily.    . cholecalciferol (VITAMIN D) 1000 UNITS tablet Take 1,000 Units by mouth  daily.      . diltiazem (CARDIZEM CD) 360 MG 24 hr capsule Take 1 capsule (360 mg total) by mouth daily. 90 capsule 3  . LYSINE PO Take 1-2 tablets by mouth daily as needed (fever blister).    . metoprolol succinate (TOPROL-XL) 50 MG 24 hr tablet Take 1 tablet (50 mg total) by mouth daily. 90 tablet 2  . Multiple Vitamin (MULTIVITAMIN) capsule Take 1 capsule by mouth daily.      . mupirocin ointment (BACTROBAN) 2 % Apply 1 application topically 3 (three) times daily.   0  . Omega-3 Fatty Acids (FISH OIL) 1200 MG CAPS Take 1 capsule by mouth daily. Mega Red.    Vladimir Faster Glycol-Propyl Glycol (SYSTANE OP) Place 1-2 drops into both eyes daily.     . polyethylene glycol powder  (GLYCOLAX/MIRALAX) powder Take 2 Containers by mouth daily.   0  . Probiotic Product (ALIGN PO) Take 1 capsule by mouth daily.     Marland Kitchen SYNTHROID 50 MCG tablet as directed.  7  . SYNTHROID 75 MCG tablet Take 50-75 mcg by mouth daily before breakfast. Alternates between 50 mcg and 75 mcg daily  2  . temazepam (RESTORIL) 30 MG capsule Take 30 mg by mouth at bedtime.     . VENTOLIN HFA 108 (90 Base) MCG/ACT inhaler Inhale 2 puffs into the lungs as needed.   0  . warfarin (COUMADIN) 5 MG tablet Take 5 mg by mouth as directed. As directed by Coumadin Clinic     No current facility-administered medications for this visit.    Allergies: No Known Allergies  Social History: The patient  reports that she quit smoking about 35 years ago. Her smoking use included Cigarettes. She has never used smokeless tobacco. She reports that she drinks about 8.4 oz of alcohol per week. She reports that she does not use illicit drugs.   Family History: The patient's family history includes Diabetes in her paternal grandfather; Heart attack in her father; Heart disease in her brother; Heart failure in her mother; Hypertension in her mother; Lung cancer in her son. There is no history of Colon cancer, Kidney disease, Liver disease, Esophageal cancer, or Stroke.   Review of Systems: Please see the history of present illness.   Otherwise, the review of systems is positive for none.   All other systems are reviewed and negative.   Physical Exam: VS:  BP 120/60 mmHg  Pulse 60  Ht 5' (1.524 m)  Wt 124 lb 6.4 oz (56.427 kg)  BMI 24.30 kg/m2 .  BMI Body mass index is 24.3 kg/(m^2).  Wt Readings from Last 3 Encounters:  04/23/15 124 lb 6.4 oz (56.427 kg)  03/31/15 124 lb 9.6 oz (56.518 kg)  02/27/15 125 lb (56.7 kg)    General: Pleasant. She looks like she does not feel well. She is in no acute distress. HEENT: Normal. Neck: Supple, no JVD, carotid bruits, or masses noted.  Cardiac: Regular rate and rhythm. No  murmurs, rubs, or gallops. No edema.  Respiratory:  Lungs are clear to auscultation bilaterally with normal work of breathing.  GI: Soft and nontender.  MS: No deformity or atrophy. Gait and ROM intact. Skin: Warm and dry. Color is normal.  Neuro:  Strength and sensation are intact and no gross focal deficits noted.  Psych: Alert, appropriate and with normal affect.   LABORATORY DATA:  EKG:  EKG is not ordered today.  Lab Results  Component Value Date   WBC 10.7*  07/30/2014   HGB 10.6* 07/30/2014   HCT 32.0* 07/30/2014   PLT 552.0* 07/30/2014   GLUCOSE 109* 07/19/2014   ALT 13* 07/19/2014   AST 22 07/19/2014   NA 140 07/19/2014   K 3.6 07/19/2014   CL 104 07/19/2014   CREATININE 0.63 07/19/2014   BUN 7 07/19/2014   CO2 27 07/19/2014   INR 4.0 04/23/2015    BNP (last 3 results) No results for input(s): BNP in the last 8760 hours.  ProBNP (last 3 results) No results for input(s): PROBNP in the last 8760 hours.   Other Studies Reviewed Today:   Assessment/Plan: 1. Sick sinus syndrome - has PPM in place.   2. PAF - with more recent exacerbations - on amiodarone - ablation has already been discussed - she feels like is not well enough to proceed at this time. Her studies have been cancelled and she is to call Claiborne Billings - Dr. Jackalyn Lombard nurse when she is ready to proceed.   3. HTN - BP ok on current regimen.   4. Chronic anticoagulation - no problems noted.   She would like to follow with Dr. Oval Linsey going forward. Will arrange for OV in 4 months.    Current medicines are reviewed with the patient today.  The patient does not have concerns regarding medicines other than what has been noted above.  The following changes have been made:  See above.  Labs/ tests ordered today include:   No orders of the defined types were placed in this encounter.     Disposition:   FU with Dr. Oval Linsey in 4 months. She will be in touch with Dr. Rayann Heman when ready to proceed with  ablation.  Patient is agreeable to this plan and will call if any problems develop in the interim.   Signed: Burtis Junes, RN, ANP-C 04/23/2015 2:34 PM  [facility] [address] [place], [place]  [address] Phone: [phone] Fax: [phone]

## 2015-04-23 NOTE — Telephone Encounter (Signed)
Patient in to see Truitt Merle, NP today and wants to cancel her ablation on 05/08/15.  She will follow up with Dr Oval Linsey in 4 months and call back if she would like to proceed with ablation in the future.  I have canceled the procdure with lab([facility]) and CT pre- procedure canceled

## 2015-04-24 DIAGNOSIS — M5116 Intervertebral disc disorders with radiculopathy, lumbar region: Secondary | ICD-10-CM | POA: Diagnosis not present

## 2015-04-24 DIAGNOSIS — M9903 Segmental and somatic dysfunction of lumbar region: Secondary | ICD-10-CM | POA: Diagnosis not present

## 2015-04-28 DIAGNOSIS — M5116 Intervertebral disc disorders with radiculopathy, lumbar region: Secondary | ICD-10-CM | POA: Diagnosis not present

## 2015-04-28 DIAGNOSIS — M9903 Segmental and somatic dysfunction of lumbar region: Secondary | ICD-10-CM | POA: Diagnosis not present

## 2015-04-30 DIAGNOSIS — M5116 Intervertebral disc disorders with radiculopathy, lumbar region: Secondary | ICD-10-CM | POA: Diagnosis not present

## 2015-04-30 DIAGNOSIS — M9903 Segmental and somatic dysfunction of lumbar region: Secondary | ICD-10-CM | POA: Diagnosis not present

## 2015-05-01 ENCOUNTER — Ambulatory Visit (INDEPENDENT_AMBULATORY_CARE_PROVIDER_SITE_OTHER): Payer: Medicare Other | Admitting: Pharmacist

## 2015-05-01 DIAGNOSIS — I480 Paroxysmal atrial fibrillation: Secondary | ICD-10-CM

## 2015-05-01 DIAGNOSIS — I4891 Unspecified atrial fibrillation: Secondary | ICD-10-CM | POA: Diagnosis not present

## 2015-05-01 DIAGNOSIS — Z5181 Encounter for therapeutic drug level monitoring: Secondary | ICD-10-CM

## 2015-05-01 LAB — POCT INR: INR: 2.6

## 2015-05-02 ENCOUNTER — Ambulatory Visit (HOSPITAL_COMMUNITY): Payer: Medicare Other

## 2015-05-02 DIAGNOSIS — H10413 Chronic giant papillary conjunctivitis, bilateral: Secondary | ICD-10-CM | POA: Diagnosis not present

## 2015-05-02 DIAGNOSIS — H01022 Squamous blepharitis right lower eyelid: Secondary | ICD-10-CM | POA: Diagnosis not present

## 2015-05-02 DIAGNOSIS — H01024 Squamous blepharitis left upper eyelid: Secondary | ICD-10-CM | POA: Diagnosis not present

## 2015-05-02 DIAGNOSIS — H01021 Squamous blepharitis right upper eyelid: Secondary | ICD-10-CM | POA: Diagnosis not present

## 2015-05-02 DIAGNOSIS — Z961 Presence of intraocular lens: Secondary | ICD-10-CM | POA: Diagnosis not present

## 2015-05-02 DIAGNOSIS — H04123 Dry eye syndrome of bilateral lacrimal glands: Secondary | ICD-10-CM | POA: Diagnosis not present

## 2015-05-02 DIAGNOSIS — H01025 Squamous blepharitis left lower eyelid: Secondary | ICD-10-CM | POA: Diagnosis not present

## 2015-05-05 DIAGNOSIS — M5116 Intervertebral disc disorders with radiculopathy, lumbar region: Secondary | ICD-10-CM | POA: Diagnosis not present

## 2015-05-05 DIAGNOSIS — M9903 Segmental and somatic dysfunction of lumbar region: Secondary | ICD-10-CM | POA: Diagnosis not present

## 2015-05-06 DIAGNOSIS — Z79899 Other long term (current) drug therapy: Secondary | ICD-10-CM | POA: Diagnosis not present

## 2015-05-06 DIAGNOSIS — E039 Hypothyroidism, unspecified: Secondary | ICD-10-CM | POA: Diagnosis not present

## 2015-05-07 ENCOUNTER — Telehealth (HOSPITAL_COMMUNITY): Payer: Self-pay | Admitting: *Deleted

## 2015-05-07 ENCOUNTER — Telehealth: Payer: Self-pay | Admitting: Internal Medicine

## 2015-05-07 DIAGNOSIS — M5116 Intervertebral disc disorders with radiculopathy, lumbar region: Secondary | ICD-10-CM | POA: Diagnosis not present

## 2015-05-07 DIAGNOSIS — M9903 Segmental and somatic dysfunction of lumbar region: Secondary | ICD-10-CM | POA: Diagnosis not present

## 2015-05-07 NOTE — Telephone Encounter (Signed)
Spoke with patient and let her know I would be in touch with her tomorrow to schedule

## 2015-05-07 NOTE — Telephone Encounter (Signed)
Pt called in stating her afib is starting to act up again and is "getting on her nerves" questioning whether she should/could increase a certain medication to curb the afib.  Pt states the last time she increased her amiodarone to 200mg  a day for short-term it "messed up her thyroid levels" so she is on 100mg  of amiodarone a day.  HR when in NSR is 65-78 and when in afib closer to 100 but not over 100.  Patient states no other symptoms noted other than "she knows its there" she is planning a trip to [place], [place] for her birthday and wants to have a plan in place in case she goes into afib while out of town.  Instructed patient I will discussed with Roderic Palau, NP if medication changes are warranted. Pt also mentioned she would like to be rescheduled at some point for ablation with Dr. Rayann Heman but will be in touch with his office after her trip.  Will be in touch with patient later today/in AM with recommendations from [facility].

## 2015-05-07 NOTE — Telephone Encounter (Signed)
New Message  Pt requested to speak w/ RN to resch her ablation. Please call back and discuss.

## 2015-05-08 ENCOUNTER — Ambulatory Visit (HOSPITAL_COMMUNITY): Admit: 2015-05-08 | Payer: Medicare Other | Admitting: Internal Medicine

## 2015-05-08 ENCOUNTER — Encounter (HOSPITAL_COMMUNITY): Payer: Self-pay

## 2015-05-08 SURGERY — ATRIAL FIBRILLATION ABLATION
Anesthesia: Monitor Anesthesia Care

## 2015-05-08 NOTE — Telephone Encounter (Signed)
Butch Penny talked with patient -- does not want to increase amiodarone but did instruct patient if she feels like her heart rate in afib is fast can take extra 1/2 tablet of metoprolol as long as blood pressure is acceptable. Patient verbalized understanding and was appreciative of advice.

## 2015-05-08 NOTE — Telephone Encounter (Signed)
Weekly INR's starting week of 4/24 Echo week of 4/24 CT week of 5/15 Labs week of 5/8 Ablation 5/23 9:30

## 2015-05-12 ENCOUNTER — Encounter: Payer: Self-pay | Admitting: Internal Medicine

## 2015-05-13 DIAGNOSIS — M9903 Segmental and somatic dysfunction of lumbar region: Secondary | ICD-10-CM | POA: Diagnosis not present

## 2015-05-13 DIAGNOSIS — M5116 Intervertebral disc disorders with radiculopathy, lumbar region: Secondary | ICD-10-CM | POA: Diagnosis not present

## 2015-05-16 DIAGNOSIS — H04123 Dry eye syndrome of bilateral lacrimal glands: Secondary | ICD-10-CM | POA: Diagnosis not present

## 2015-05-16 DIAGNOSIS — H01021 Squamous blepharitis right upper eyelid: Secondary | ICD-10-CM | POA: Diagnosis not present

## 2015-05-16 DIAGNOSIS — H01025 Squamous blepharitis left lower eyelid: Secondary | ICD-10-CM | POA: Diagnosis not present

## 2015-05-16 DIAGNOSIS — H01024 Squamous blepharitis left upper eyelid: Secondary | ICD-10-CM | POA: Diagnosis not present

## 2015-05-16 DIAGNOSIS — Z961 Presence of intraocular lens: Secondary | ICD-10-CM | POA: Diagnosis not present

## 2015-05-16 DIAGNOSIS — H10413 Chronic giant papillary conjunctivitis, bilateral: Secondary | ICD-10-CM | POA: Diagnosis not present

## 2015-05-16 DIAGNOSIS — H01022 Squamous blepharitis right lower eyelid: Secondary | ICD-10-CM | POA: Diagnosis not present

## 2015-05-19 DIAGNOSIS — M9903 Segmental and somatic dysfunction of lumbar region: Secondary | ICD-10-CM | POA: Diagnosis not present

## 2015-05-19 DIAGNOSIS — M5116 Intervertebral disc disorders with radiculopathy, lumbar region: Secondary | ICD-10-CM | POA: Diagnosis not present

## 2015-05-22 ENCOUNTER — Ambulatory Visit (INDEPENDENT_AMBULATORY_CARE_PROVIDER_SITE_OTHER): Payer: Medicare Other | Admitting: *Deleted

## 2015-05-22 DIAGNOSIS — I480 Paroxysmal atrial fibrillation: Secondary | ICD-10-CM

## 2015-05-22 DIAGNOSIS — I4891 Unspecified atrial fibrillation: Secondary | ICD-10-CM

## 2015-05-22 DIAGNOSIS — Z5181 Encounter for therapeutic drug level monitoring: Secondary | ICD-10-CM | POA: Diagnosis not present

## 2015-05-22 LAB — POCT INR: INR: 3.6

## 2015-05-23 ENCOUNTER — Other Ambulatory Visit: Payer: Self-pay

## 2015-05-23 DIAGNOSIS — Z1231 Encounter for screening mammogram for malignant neoplasm of breast: Secondary | ICD-10-CM

## 2015-05-26 DIAGNOSIS — M9903 Segmental and somatic dysfunction of lumbar region: Secondary | ICD-10-CM | POA: Diagnosis not present

## 2015-05-26 DIAGNOSIS — M5116 Intervertebral disc disorders with radiculopathy, lumbar region: Secondary | ICD-10-CM | POA: Diagnosis not present

## 2015-05-27 ENCOUNTER — Ambulatory Visit (INDEPENDENT_AMBULATORY_CARE_PROVIDER_SITE_OTHER): Payer: Medicare Other

## 2015-05-27 DIAGNOSIS — I4891 Unspecified atrial fibrillation: Secondary | ICD-10-CM

## 2015-05-27 DIAGNOSIS — I480 Paroxysmal atrial fibrillation: Secondary | ICD-10-CM

## 2015-05-27 DIAGNOSIS — Z5181 Encounter for therapeutic drug level monitoring: Secondary | ICD-10-CM

## 2015-05-27 LAB — POCT INR: INR: 3.7

## 2015-05-29 ENCOUNTER — Other Ambulatory Visit (HOSPITAL_COMMUNITY): Payer: Medicare Other

## 2015-06-04 ENCOUNTER — Ambulatory Visit (INDEPENDENT_AMBULATORY_CARE_PROVIDER_SITE_OTHER): Payer: Medicare Other | Admitting: *Deleted

## 2015-06-04 DIAGNOSIS — I4891 Unspecified atrial fibrillation: Secondary | ICD-10-CM

## 2015-06-04 DIAGNOSIS — Z5181 Encounter for therapeutic drug level monitoring: Secondary | ICD-10-CM

## 2015-06-04 DIAGNOSIS — M5116 Intervertebral disc disorders with radiculopathy, lumbar region: Secondary | ICD-10-CM | POA: Diagnosis not present

## 2015-06-04 DIAGNOSIS — I480 Paroxysmal atrial fibrillation: Secondary | ICD-10-CM | POA: Diagnosis not present

## 2015-06-04 DIAGNOSIS — M9903 Segmental and somatic dysfunction of lumbar region: Secondary | ICD-10-CM | POA: Diagnosis not present

## 2015-06-04 LAB — POCT INR: INR: 3.6

## 2015-06-05 ENCOUNTER — Other Ambulatory Visit: Payer: Self-pay

## 2015-06-05 ENCOUNTER — Ambulatory Visit (HOSPITAL_COMMUNITY): Payer: Medicare Other | Attending: Cardiology

## 2015-06-05 DIAGNOSIS — I119 Hypertensive heart disease without heart failure: Secondary | ICD-10-CM | POA: Diagnosis not present

## 2015-06-05 DIAGNOSIS — I480 Paroxysmal atrial fibrillation: Secondary | ICD-10-CM

## 2015-06-05 DIAGNOSIS — I4891 Unspecified atrial fibrillation: Secondary | ICD-10-CM | POA: Diagnosis present

## 2015-06-05 DIAGNOSIS — I351 Nonrheumatic aortic (valve) insufficiency: Secondary | ICD-10-CM | POA: Diagnosis not present

## 2015-06-05 DIAGNOSIS — I059 Rheumatic mitral valve disease, unspecified: Secondary | ICD-10-CM | POA: Diagnosis not present

## 2015-06-05 LAB — ECHOCARDIOGRAM COMPLETE
A4CEF: 72 %
CHL CUP AORTIC ROOT 2D: 28 mm
CHL CUP DOP CALC LVOT VTI: 22.4 cm
CHL CUP LA SIZE INDEX: 2.88 mm/m2
CHL CUP LA VOL 2D: 55 mL
CHL CUP LVOT MEAN VEL: 63.7 cm/s
CHL CUP LVOT SV INDEX: 46 mL/m2
CHL CUP TV PEAK RV-RA GRADIENT: 27 cm/s
E decel time: 173 msec
E/e' ratio: 7.88
FS: 43 % (ref 28–44)
IV/PV OW: 1.16
LA VOL 2D INDEX: 35.9 mL/m2
LA diam end sys: 44 mm
LA vol index: 40.5 mL/m2
LA vol: 62 mL
LASIZE: 44 mm
LDCA: 3.14 cm2
LV PW d: 8.12 mm — AB (ref 0.6–1.1)
LV PW s: 8.12 mm
LVIDD: 49.1 mm — AB (ref 3.5–6.0)
LVIDS: 27.9 mm — AB (ref 2.1–4.0)
LVOT peak vel: 83.5 cm/s
LVOTD: 20 mm
LVOTSV: 70 mL
MV Dec: 173 ms
MVPG: 4 mmHg
MVPKEVEL: 97.7 cm/s
P 1/2 time: 477 ms
TDI e' lateral: 12.4 cm/s
TDI e' medial: 8.33 cm/s
TR max vel: 258 cm/s

## 2015-06-09 ENCOUNTER — Other Ambulatory Visit: Payer: Self-pay | Admitting: *Deleted

## 2015-06-09 MED ORDER — AMIODARONE HCL 200 MG PO TABS: 100.0000 mg | ORAL_TABLET | Freq: Every day | ORAL | Status: DC

## 2015-06-11 ENCOUNTER — Ambulatory Visit (INDEPENDENT_AMBULATORY_CARE_PROVIDER_SITE_OTHER): Payer: Medicare Other | Admitting: *Deleted

## 2015-06-11 DIAGNOSIS — I4891 Unspecified atrial fibrillation: Secondary | ICD-10-CM

## 2015-06-11 DIAGNOSIS — I480 Paroxysmal atrial fibrillation: Secondary | ICD-10-CM

## 2015-06-11 DIAGNOSIS — Z5181 Encounter for therapeutic drug level monitoring: Secondary | ICD-10-CM

## 2015-06-11 LAB — POCT INR: INR: 1.8

## 2015-06-13 DIAGNOSIS — M859 Disorder of bone density and structure, unspecified: Secondary | ICD-10-CM | POA: Diagnosis not present

## 2015-06-17 ENCOUNTER — Ambulatory Visit
Admission: RE | Admit: 2015-06-17 | Discharge: 2015-06-17 | Disposition: A | Discharge status: Home / Self Care | Payer: Medicare Other | Source: Ambulatory Visit

## 2015-06-17 DIAGNOSIS — Z1231 Encounter for screening mammogram for malignant neoplasm of breast: Secondary | ICD-10-CM

## 2015-06-18 ENCOUNTER — Telehealth: Payer: Self-pay | Admitting: Cardiovascular Disease

## 2015-06-18 MED ORDER — WARFARIN SODIUM 5 MG PO TABS: 5.0000 mg | ORAL_TABLET | ORAL | Status: DC

## 2015-06-18 NOTE — Telephone Encounter (Signed)
Patient called directly in to N. Rose, RN triage line and left VM requesting refill of warfarin to be sent to [facility] on [address] -- patient left name, DOB, phone # for return call  Called patient and left message that medication will be refilled and left an appt reminder for her coumadin check scheduled for 06/20/15 Rx(s) sent to pharmacy electronically.

## 2015-06-20 ENCOUNTER — Encounter: Payer: Self-pay | Admitting: Podiatry

## 2015-06-20 ENCOUNTER — Ambulatory Visit (INDEPENDENT_AMBULATORY_CARE_PROVIDER_SITE_OTHER): Payer: Medicare Other | Admitting: Podiatry

## 2015-06-20 ENCOUNTER — Ambulatory Visit (INDEPENDENT_AMBULATORY_CARE_PROVIDER_SITE_OTHER): Payer: Medicare Other | Admitting: *Deleted

## 2015-06-20 ENCOUNTER — Ambulatory Visit (INDEPENDENT_AMBULATORY_CARE_PROVIDER_SITE_OTHER): Payer: Medicare Other

## 2015-06-20 VITALS — BP 125/64 | HR 67 | Resp 16 | Ht 61.0 in | Wt 125.0 lb

## 2015-06-20 DIAGNOSIS — I480 Paroxysmal atrial fibrillation: Secondary | ICD-10-CM

## 2015-06-20 DIAGNOSIS — Z5181 Encounter for therapeutic drug level monitoring: Secondary | ICD-10-CM

## 2015-06-20 DIAGNOSIS — M79671 Pain in right foot: Secondary | ICD-10-CM

## 2015-06-20 DIAGNOSIS — M21619 Bunion of unspecified foot: Secondary | ICD-10-CM

## 2015-06-20 DIAGNOSIS — I4891 Unspecified atrial fibrillation: Secondary | ICD-10-CM

## 2015-06-20 DIAGNOSIS — M779 Enthesopathy, unspecified: Secondary | ICD-10-CM

## 2015-06-20 LAB — POCT INR: INR: 3.1

## 2015-06-20 NOTE — Progress Notes (Signed)
   Subjective:    Patient ID: Kathryn Gibson, female    DOB: [birth year], 82 y.o.   MRN: [id number]  HPI Chief Complaint  Patient presents with  . Foot Pain    Right foot; Dorsal & Plantar forefoot & bunion; pt stated, "thinks has arthritis in foot; Had surgery done on right foot 2nd toe-took bone out several years ago"      Review of Systems  Cardiovascular: Positive for palpitations.  Gastrointestinal: Positive for constipation.  Musculoskeletal: Positive for arthralgias.  All other systems reviewed and are negative.      Objective:   Physical Exam        Assessment & Plan:

## 2015-06-20 NOTE — Progress Notes (Signed)
Subjective:     Patient ID: Kathryn Gibson, female   DOB: 1933-09-30, 82 y.o.   MRN: [id number]  HPI patient presents stating that she has pain around her bunion site right and then pain in her right forefoot that she's not sure about. States that it's been hurting her for a while and certain shoe gear is more difficult than others   Review of Systems  All other systems reviewed and are negative.      Objective:   Physical Exam  Constitutional: She is oriented to person, place, and time.  Cardiovascular: Intact distal pulses.   Musculoskeletal: Normal range of motion.  Neurological: She is oriented to person, place, and time.  Skin: Skin is warm and dry.  Nursing note and vitals reviewed.  neurovascular status found to be diminished DP PT pulses bilateral with hyperostosis medial aspect first metatarsal head right and digital deformity second digit with inflammation around the second metatarsophalangeal joint of a mild nature right. Patient is noted to have good digital perfusion is well oriented 3 with no current equinus condition noted     Assessment:     Inflammatory capsulitis with moderate structural bunion deformity with inflammation around the first metatarsal head    Plan:     H&P and x-rays reviewed. I discussed different treatment options and I recommended at this time wider shoes softer leather with considerations for injection treatment if symptoms were to persist. Patient will try first this conservative treatment see how it does  X-ray report indicates that there is elongation elevation of the first metatarsal angle right with deviation of the hallux against the second toe

## 2015-06-23 ENCOUNTER — Telehealth: Payer: Self-pay | Admitting: Internal Medicine

## 2015-06-23 NOTE — Telephone Encounter (Signed)
Spoke with pt.  She will come to the clinic on 5/16 to check INR.  If subtherapeutic, will need to discuss ablation with Dr. Rayann Heman to ensure pt needs CT scan on Wednesday.  May consider changing pt to Xarelto for the next week to ensure fully anticoagulated since she has had some issues maintaining therapeutic INR the past few weeks.  Will await INR results tomorrow to determine actions.

## 2015-06-23 NOTE — Telephone Encounter (Signed)
New Message  Pt had additional questions about her blood about ablation- sched for 5/23. Also had questions about upcoming CT. Pt requested call back after 2pm today. Please call back and discuss.

## 2015-06-24 ENCOUNTER — Ambulatory Visit (INDEPENDENT_AMBULATORY_CARE_PROVIDER_SITE_OTHER): Payer: Medicare Other | Admitting: *Deleted

## 2015-06-24 DIAGNOSIS — I480 Paroxysmal atrial fibrillation: Secondary | ICD-10-CM

## 2015-06-24 DIAGNOSIS — Z5181 Encounter for therapeutic drug level monitoring: Secondary | ICD-10-CM | POA: Diagnosis not present

## 2015-06-24 LAB — POCT INR: INR: 2.3

## 2015-06-24 NOTE — Telephone Encounter (Signed)
Pt's INR today 2.3.  Discussed option of changing to Xarelto for the next few weeks with patient to ensure appropriately anticoagulated for procedure without having to be concerned with INR.  She is agreeable to this.  CrCl- 44 mL/min.  Will dose at 15mg  once daily with meal.  She will start tonight.

## 2015-06-25 ENCOUNTER — Other Ambulatory Visit: Payer: Self-pay | Admitting: Internal Medicine

## 2015-06-25 ENCOUNTER — Ambulatory Visit (HOSPITAL_COMMUNITY)
Admission: RE | Admit: 2015-06-25 | Discharge: 2015-06-25 | Disposition: A | Discharge status: Home / Self Care | Payer: Medicare Other | Source: Ambulatory Visit | Attending: Internal Medicine | Admitting: Internal Medicine

## 2015-06-25 DIAGNOSIS — I480 Paroxysmal atrial fibrillation: Secondary | ICD-10-CM

## 2015-06-26 ENCOUNTER — Ambulatory Visit (HOSPITAL_COMMUNITY)
Admission: RE | Admit: 2015-06-26 | Discharge: 2015-06-26 | Disposition: A | Discharge status: Home / Self Care | Payer: Medicare Other | Source: Ambulatory Visit | Attending: Internal Medicine | Admitting: Internal Medicine

## 2015-06-26 ENCOUNTER — Encounter (HOSPITAL_COMMUNITY): Payer: Self-pay

## 2015-06-26 DIAGNOSIS — I517 Cardiomegaly: Secondary | ICD-10-CM | POA: Diagnosis not present

## 2015-06-26 DIAGNOSIS — I480 Paroxysmal atrial fibrillation: Secondary | ICD-10-CM

## 2015-06-26 DIAGNOSIS — M5116 Intervertebral disc disorders with radiculopathy, lumbar region: Secondary | ICD-10-CM | POA: Diagnosis not present

## 2015-06-26 DIAGNOSIS — M9903 Segmental and somatic dysfunction of lumbar region: Secondary | ICD-10-CM | POA: Diagnosis not present

## 2015-06-26 LAB — POCT I-STAT CREATININE: Creatinine, Ser: 0.9 mg/dL (ref 0.44–1.00)

## 2015-06-26 MED ORDER — IOPAMIDOL (ISOVUE-370) INJECTION 76%
INTRAVENOUS | Status: AC
  Administered 2015-06-26: 80 mL
  Filled 2015-06-26: qty 100

## 2015-06-27 ENCOUNTER — Telehealth: Payer: Self-pay | Admitting: Internal Medicine

## 2015-06-27 NOTE — Telephone Encounter (Signed)
New message  Pt is requesting a call back to discuss her ablation time. She was told 9:30a and 11a so she proclaims that she is a bit confused please call back to clarify.

## 2015-06-27 NOTE — Telephone Encounter (Signed)
Returned call to patient and went over again her time to be at the hospital. She will be there at 7:30am NPO after MN No medications the morning of She verbalized understanding

## 2015-06-30 ENCOUNTER — Ambulatory Visit (INDEPENDENT_AMBULATORY_CARE_PROVIDER_SITE_OTHER): Payer: Medicare Other | Admitting: *Deleted

## 2015-06-30 DIAGNOSIS — I495 Sick sinus syndrome: Secondary | ICD-10-CM | POA: Diagnosis not present

## 2015-06-30 NOTE — Progress Notes (Signed)
Remote pacemaker transmission.   

## 2015-07-01 ENCOUNTER — Ambulatory Visit (HOSPITAL_COMMUNITY): Payer: Medicare Other | Admitting: Certified Registered"

## 2015-07-01 ENCOUNTER — Encounter (HOSPITAL_COMMUNITY): Payer: Self-pay

## 2015-07-01 ENCOUNTER — Ambulatory Visit (HOSPITAL_COMMUNITY)
Admission: RE | Admit: 2015-07-01 | Discharge: 2015-07-02 | Disposition: A | Discharge status: Home / Self Care | Payer: Medicare Other | Source: Ambulatory Visit | Attending: Internal Medicine | Admitting: Internal Medicine

## 2015-07-01 ENCOUNTER — Encounter (HOSPITAL_COMMUNITY)
Admission: RE | Disposition: A | Discharge status: Home / Self Care | Payer: Self-pay | Source: Ambulatory Visit | Attending: Internal Medicine

## 2015-07-01 ENCOUNTER — Ambulatory Visit (HOSPITAL_COMMUNITY): Payer: Medicare Other

## 2015-07-01 ENCOUNTER — Ambulatory Visit (HOSPITAL_COMMUNITY): Admit: 2015-07-01 | Payer: Self-pay | Admitting: Cardiology

## 2015-07-01 DIAGNOSIS — Z79899 Other long term (current) drug therapy: Secondary | ICD-10-CM | POA: Insufficient documentation

## 2015-07-01 DIAGNOSIS — I495 Sick sinus syndrome: Secondary | ICD-10-CM | POA: Insufficient documentation

## 2015-07-01 DIAGNOSIS — I481 Persistent atrial fibrillation: Secondary | ICD-10-CM | POA: Insufficient documentation

## 2015-07-01 DIAGNOSIS — K589 Irritable bowel syndrome without diarrhea: Secondary | ICD-10-CM | POA: Diagnosis not present

## 2015-07-01 DIAGNOSIS — I10 Essential (primary) hypertension: Secondary | ICD-10-CM | POA: Insufficient documentation

## 2015-07-01 DIAGNOSIS — E039 Hypothyroidism, unspecified: Secondary | ICD-10-CM | POA: Diagnosis not present

## 2015-07-01 DIAGNOSIS — I4891 Unspecified atrial fibrillation: Secondary | ICD-10-CM | POA: Diagnosis present

## 2015-07-01 DIAGNOSIS — Z87891 Personal history of nicotine dependence: Secondary | ICD-10-CM | POA: Insufficient documentation

## 2015-07-01 DIAGNOSIS — I484 Atypical atrial flutter: Secondary | ICD-10-CM | POA: Diagnosis not present

## 2015-07-01 DIAGNOSIS — Z950 Presence of cardiac pacemaker: Secondary | ICD-10-CM | POA: Insufficient documentation

## 2015-07-01 DIAGNOSIS — Z7901 Long term (current) use of anticoagulants: Secondary | ICD-10-CM | POA: Insufficient documentation

## 2015-07-01 DIAGNOSIS — M1990 Unspecified osteoarthritis, unspecified site: Secondary | ICD-10-CM | POA: Diagnosis not present

## 2015-07-01 DIAGNOSIS — I480 Paroxysmal atrial fibrillation: Secondary | ICD-10-CM | POA: Diagnosis not present

## 2015-07-01 DIAGNOSIS — I483 Typical atrial flutter: Secondary | ICD-10-CM | POA: Diagnosis not present

## 2015-07-01 HISTORY — PX: TEE WITHOUT CARDIOVERSION: SHX5443

## 2015-07-01 HISTORY — PX: ELECTROPHYSIOLOGIC STUDY: SHX172A

## 2015-07-01 LAB — BASIC METABOLIC PANEL
Anion gap: 8 (ref 5–15)
BUN: 17 mg/dL (ref 6–20)
CHLORIDE: 104 mmol/L (ref 101–111)
CO2: 26 mmol/L (ref 22–32)
CREATININE: 0.83 mg/dL (ref 0.44–1.00)
Calcium: 9.4 mg/dL (ref 8.9–10.3)
GFR calc non Af Amer: >60 mL/min (ref >60)
Glucose, Bld: 89 mg/dL (ref 65–99)
Potassium: 3.9 mmol/L (ref 3.5–5.1)
Sodium: 138 mmol/L (ref 135–145)

## 2015-07-01 LAB — MRSA PCR SCREENING: MRSA by PCR: NEGATIVE

## 2015-07-01 LAB — CBC
HCT: 40.9 % (ref 36.0–46.0)
Hemoglobin: 13.4 g/dL (ref 12.0–15.0)
MCH: 29.8 pg (ref 26.0–34.0)
MCHC: 32.8 g/dL (ref 30.0–36.0)
MCV: 91.1 fL (ref 78.0–100.0)
PLATELETS: 201 10*3/uL (ref 150–400)
RBC: 4.49 MIL/uL (ref 3.87–5.11)
RDW: 14.1 % (ref 11.5–15.5)
WBC: 7.5 10*3/uL (ref 4.0–10.5)

## 2015-07-01 LAB — POCT ACTIVATED CLOTTING TIME
ACTIVATED CLOTTING TIME: 307 s
ACTIVATED CLOTTING TIME: 322 s
ACTIVATED CLOTTING TIME: 322 s
ACTIVATED CLOTTING TIME: 167 s

## 2015-07-01 SURGERY — ECHOCARDIOGRAM, TRANSESOPHAGEAL
Anesthesia: Moderate Sedation

## 2015-07-01 SURGERY — ATRIAL FIBRILLATION ABLATION
Anesthesia: General

## 2015-07-01 MED ORDER — SODIUM CHLORIDE 0.9% FLUSH: 3.0000 mL | Freq: Two times a day (BID) | INTRAVENOUS | Status: DC

## 2015-07-01 MED ORDER — ONDANSETRON HCL 4 MG/2ML IJ SOLN: 4.0000 mg | Freq: Once | INTRAMUSCULAR | Status: DC | PRN

## 2015-07-01 MED ORDER — BUPIVACAINE HCL (PF) 0.25 % IJ SOLN
INTRAMUSCULAR | Status: DC | PRN
  Administered 2015-07-01: 15 mL

## 2015-07-01 MED ORDER — HYDROCODONE-ACETAMINOPHEN 5-325 MG PO TABS: 1.0000 | ORAL_TABLET | ORAL | Status: DC | PRN

## 2015-07-01 MED ORDER — FENTANYL CITRATE (PF) 100 MCG/2ML IJ SOLN
INTRAMUSCULAR | Status: AC
  Filled 2015-07-01: qty 2

## 2015-07-01 MED ORDER — HEPARIN SODIUM (PORCINE) 1000 UNIT/ML IJ SOLN
INTRAMUSCULAR | Status: DC | PRN
  Administered 2015-07-01: 10000 [IU] via INTRAVENOUS
  Administered 2015-07-01 (×2): 1000 [IU] via INTRAVENOUS

## 2015-07-01 MED ORDER — HEPARIN SODIUM (PORCINE) 1000 UNIT/ML IJ SOLN
INTRAMUSCULAR | Status: AC
  Filled 2015-07-01: qty 1

## 2015-07-01 MED ORDER — RIVAROXABAN 15 MG PO TABS
15.0000 mg | ORAL_TABLET | Freq: Every day | ORAL | Status: DC
  Administered 2015-07-01: 15 mg via ORAL
  Filled 2015-07-01: qty 1

## 2015-07-01 MED ORDER — ONDANSETRON HCL 4 MG/2ML IJ SOLN
INTRAMUSCULAR | Status: DC | PRN
  Administered 2015-07-01: 4 mg via INTRAVENOUS

## 2015-07-01 MED ORDER — FENTANYL CITRATE (PF) 100 MCG/2ML IJ SOLN
INTRAMUSCULAR | Status: DC | PRN
  Administered 2015-07-01 (×3): 25 ug via INTRAVENOUS

## 2015-07-01 MED ORDER — LIDOCAINE HCL (CARDIAC) 20 MG/ML IV SOLN
INTRAVENOUS | Status: DC | PRN
  Administered 2015-07-01: 50 mg via INTRAVENOUS

## 2015-07-01 MED ORDER — MIDAZOLAM HCL 5 MG/ML IJ SOLN
INTRAMUSCULAR | Status: AC
  Filled 2015-07-01: qty 2

## 2015-07-01 MED ORDER — FENTANYL CITRATE (PF) 100 MCG/2ML IJ SOLN
INTRAMUSCULAR | Status: DC | PRN
  Administered 2015-07-01 (×2): 25 ug via INTRAVENOUS

## 2015-07-01 MED ORDER — SODIUM CHLORIDE 0.9 % IV SOLN
INTRAVENOUS | Status: DC
  Administered 2015-07-01 (×3): via INTRAVENOUS

## 2015-07-01 MED ORDER — DIPHENHYDRAMINE HCL 50 MG/ML IJ SOLN
INTRAMUSCULAR | Status: DC | PRN
  Administered 2015-07-01: 12.5 mg via INTRAVENOUS

## 2015-07-01 MED ORDER — ALBUMIN HUMAN 5 % IV SOLN
INTRAVENOUS | Status: DC | PRN
  Administered 2015-07-01: 11:00:00 via INTRAVENOUS

## 2015-07-01 MED ORDER — MIDAZOLAM HCL 5 MG/5ML IJ SOLN
INTRAMUSCULAR | Status: DC | PRN
  Administered 2015-07-01: 1 mg via INTRAVENOUS

## 2015-07-01 MED ORDER — MIDAZOLAM HCL 10 MG/2ML IJ SOLN
INTRAMUSCULAR | Status: DC | PRN
  Administered 2015-07-01 (×2): 2 mg via INTRAVENOUS

## 2015-07-01 MED ORDER — IOPAMIDOL (ISOVUE-370) INJECTION 76%
INTRAVENOUS | Status: DC | PRN
  Administered 2015-07-01: 5 mL via INTRAVENOUS

## 2015-07-01 MED ORDER — ONDANSETRON HCL 4 MG/2ML IJ SOLN: 4.0000 mg | Freq: Four times a day (QID) | INTRAMUSCULAR | Status: DC | PRN

## 2015-07-01 MED ORDER — SODIUM CHLORIDE 0.9 % IV SOLN: 250.0000 mL | INTRAVENOUS | Status: DC | PRN

## 2015-07-01 MED ORDER — PROPOFOL 10 MG/ML IV BOLUS
INTRAVENOUS | Status: DC | PRN
  Administered 2015-07-01: 80 mg via INTRAVENOUS

## 2015-07-01 MED ORDER — BUTAMBEN-TETRACAINE-BENZOCAINE 2-2-14 % EX AERO
INHALATION_SPRAY | CUTANEOUS | Status: DC | PRN
  Administered 2015-07-01: 2 via TOPICAL

## 2015-07-01 MED ORDER — IOPAMIDOL (ISOVUE-370) INJECTION 76%
INTRAVENOUS | Status: AC
  Filled 2015-07-01: qty 50

## 2015-07-01 MED ORDER — SODIUM CHLORIDE 0.9% FLUSH: 3.0000 mL | INTRAVENOUS | Status: DC | PRN

## 2015-07-01 MED ORDER — TEMAZEPAM 15 MG PO CAPS
30.0000 mg | ORAL_CAPSULE | Freq: Every day | ORAL | Status: DC
Start: 2015-07-01 — End: 2015-07-02
  Administered 2015-07-01: 30 mg via ORAL
  Filled 2015-07-01: qty 2

## 2015-07-01 MED ORDER — LEVOTHYROXINE SODIUM 50 MCG PO TABS
50.0000 ug | ORAL_TABLET | Freq: Every day | ORAL | Status: DC
Start: 2015-07-02 — End: 2015-07-02
  Administered 2015-07-02: 50 ug via ORAL
  Filled 2015-07-01: qty 1

## 2015-07-01 MED ORDER — PHENYLEPHRINE HCL 10 MG/ML IJ SOLN
10.0000 mg | INTRAVENOUS | Status: DC | PRN
  Administered 2015-07-01: 20 ug/min via INTRAVENOUS

## 2015-07-01 MED ORDER — ACETAMINOPHEN 325 MG PO TABS: 650.0000 mg | ORAL_TABLET | ORAL | Status: DC | PRN

## 2015-07-01 MED ORDER — FENTANYL CITRATE (PF) 100 MCG/2ML IJ SOLN: 25.0000 ug | INTRAMUSCULAR | Status: DC | PRN

## 2015-07-01 MED ORDER — PROTAMINE SULFATE 10 MG/ML IV SOLN
INTRAVENOUS | Status: DC | PRN
Start: 2015-07-01 — End: 2015-07-01
  Administered 2015-07-01: 30 mg via INTRAVENOUS

## 2015-07-01 MED ORDER — DIPHENHYDRAMINE HCL 50 MG/ML IJ SOLN
INTRAMUSCULAR | Status: AC
  Filled 2015-07-01: qty 1

## 2015-07-01 MED ORDER — HEPARIN SODIUM (PORCINE) 1000 UNIT/ML IJ SOLN
INTRAMUSCULAR | Status: DC | PRN
  Administered 2015-07-01 (×2): 1000 [IU] via INTRAVENOUS

## 2015-07-01 MED ORDER — BUPIVACAINE HCL (PF) 0.25 % IJ SOLN
INTRAMUSCULAR | Status: AC
  Filled 2015-07-01: qty 30

## 2015-07-01 SURGICAL SUPPLY — 18 items
BAG SNAP BAND KOVER 36X36 (MISCELLANEOUS) ×2 IMPLANT
BLANKET WARM UNDERBOD FULL ACC (MISCELLANEOUS) ×2 IMPLANT
CATH NAVISTAR SMARTTOUCH DF (ABLATOR) ×2 IMPLANT
CATH SOUNDSTAR 3D IMAGING (CATHETERS) ×2 IMPLANT
CATH VARIABLE LASSO NAV 2515 (CATHETERS) ×2 IMPLANT
CATH WEBSTER BI DIR CS D-F CRV (CATHETERS) ×2 IMPLANT
COVER SWIFTLINK CONNECTOR (BAG) ×2 IMPLANT
NEEDLE TRANSEP BRK 71CM 407200 (NEEDLE) ×2 IMPLANT
PACK EP LATEX FREE (CUSTOM PROCEDURE TRAY) ×1
PACK EP LF (CUSTOM PROCEDURE TRAY) ×1 IMPLANT
PAD DEFIB LIFELINK (PAD) ×2 IMPLANT
PATCH CARTO3 (PAD) ×2 IMPLANT
SHEATH AVANTI 11F 11CM (SHEATH) ×2 IMPLANT
SHEATH PINNACLE 7F 10CM (SHEATH) ×4 IMPLANT
SHEATH PINNACLE 9F 10CM (SHEATH) ×2 IMPLANT
SHEATH SWARTZ TS SL2 63CM 8.5F (SHEATH) ×2 IMPLANT
SHIELD RADPAD SCOOP 12X17 (MISCELLANEOUS) ×2 IMPLANT
TUBING SMART ABLATE COOLFLOW (TUBING) ×2 IMPLANT

## 2015-07-01 NOTE — Anesthesia Preprocedure Evaluation (Addendum)
Anesthesia Evaluation  Patient identified by MRN, date of birth, ID band Patient awake    Reviewed: Allergy & Precautions, H&P , NPO status , Patient's Chart, lab work & pertinent test results, reviewed documented beta blocker date and time   Airway Mallampati: II  TM Distance: >3 FB Neck ROM: full    Dental  (+) Dental Advisory Given, Caps All front teeth are capped:   Pulmonary neg pulmonary ROS, shortness of breath and with exertion, former smoker,    Pulmonary exam normal breath sounds clear to auscultation       Cardiovascular Exercise Tolerance: Good hypertension, Pt. on medications and Pt. on home beta blockers negative cardio ROS Normal cardiovascular exam+ dysrhythmias Atrial Fibrillation + pacemaker  Rhythm:regular Rate:Normal  Diastolic dysfunction   Neuro/Psych negative neurological ROS  negative psych ROS   GI/Hepatic negative GI ROS, Neg liver ROS,   Endo/Other  negative endocrine ROSHypothyroidism   Renal/GU negative Renal ROS  negative genitourinary   Musculoskeletal   Abdominal   Peds  Hematology negative hematology ROS (+)   Anesthesia Other Findings   Reproductive/Obstetrics negative OB ROS                           Anesthesia Physical Anesthesia Plan  ASA: III  Anesthesia Plan: General   Post-op Pain Management:    Induction: Intravenous  Airway Management Planned: LMA  Additional Equipment:   Intra-op Plan:   Post-operative Plan:   Informed Consent: I have reviewed the patients History and Physical, chart, labs and discussed the procedure including the risks, benefits and alternatives for the proposed anesthesia with the patient or authorized representative who has indicated his/her understanding and acceptance.   Dental Advisory Given  Plan Discussed with: CRNA  Anesthesia Plan Comments:         Anesthesia Quick Evaluation

## 2015-07-01 NOTE — CV Procedure (Signed)
    PROCEDURE NOTE:  Procedure:  Transesophageal echocardiogram Operator:  Fransico Him, MD Indications:  Atrial fibrillation Complications: None  During this procedure the patient is administered a total of Versed 4 mg and Fentanyl 50 mg and 12.5mg  Benadryl to achieve and maintain moderate conscious sedation.  The patient's heart rate, blood pressure, and oxygen saturation are monitored continuously during the procedure. The period of conscious sedation is 20 minutes, of which I was present face-to-face 100% of this time.  Results: Normal LV size and function Normal RV size and function with pacer wire in cavity Normal RA with pacer wire in cavity Mildly dilated  LA Normal TV with mild to moderate TR Normal PV with trivial PR Normal MV with mild to moderate MR Mildly thickened AV leaflets with mild to moderate AR Normal interatrial septum with no evidence of shunt by colorflow dopper  Normal thoracic and ascending aorta.  The patient tolerated the procedure well and was transferred back to their room in stable condition.  Signed: Fransico Him, MD [facility]

## 2015-07-01 NOTE — H&P (Signed)
   Expand All Collapse All   PCP: Kathryn Ringer, MD Primary Cardiologist: Dr Kathryn Gibson is a 82 y.o. female who presents today for afib ablation. Since last being seen in our clinic, the patient reports doing very well. She remains very active at [employer]. She exercises regularly. Unfortunately, she has had increasing frequency and duration of atrial fibrillation. She reports palpitations and feels washed out and fatigued. Episodes occur every few days and last up to 24 hours. Today, she denies symptoms of palpitations, chest pain, shortness of breath, lower extremity edema, dizziness, presyncope, or syncope. The patient is otherwise without complaint today.   Past Medical History  Diagnosis Date  . Atrial fibrillation ([place])   . Hypertension   . Hypothyroidism   . Irritable bowel syndrome (IBS)   . Constipation   . Colonic polyp     adenomatous  . Diverticulosis   . Bradycardia     s/p PPM  . Hemorrhoids   . Arthritis   . Cataract    Past Surgical History  Procedure Laterality Date  . Pacemaker insertion  2010    implanted by Dr Doreatha Lew ([facility])  . Colonoscopy    . Nasal endoscopy with epistaxis control N/A 07/14/2014    Procedure: NASAL ENDOSCOPY WITH EPISTAXIS CONTROL; Surgeon: Ruby Cola, MD; Location: [facility] OR; Service: ENT; Laterality: N/A;  . Radiology with anesthesia N/A 07/14/2014    Procedure: RADIOLOGY WITH ANESTHESIA; Surgeon: Medication Radiologist, MD; Location: [place]; Service: Radiology; Laterality: N/A;  . Hematoma evacuation Right 07/15/2014    Procedure: EVACUATION Right Groin Hematoma; Surgeon: Rosetta Posner, MD; Location: [place]; Service: Vascular; Laterality: Right;  . Nasal endoscopy with epistaxis control Bilateral 07/15/2014    Procedure: NASAL ENDOSCOPY WITH EPISTAXIS CONTROL; Surgeon: Ruby Cola, MD; Location: [facility] OR; Service: ENT;  Laterality: Bilateral;   .medicines reviewed  Filed Vitals:   07/01/15 0838  BP: 136/74  Pulse: 63  Temp: 97.9 F (36.6 C)  Resp: 20     GEN- The patient is well appearing, alert and oriented x 3 today.  Head- normocephalic, atraumatic Eyes- Sclera clear, conjunctiva pink Ears- hearing intact Oropharynx- clear Lungs- Clear to ausculation bilaterally, normal work of breathing Chest- pacemaker pocket is well healed Heart- Regular rate and rhythm, no murmurs, rubs or gallops, PMI not laterally displaced GI- soft, NT, ND, + BS Extremities- no clubbing, cyanosis, or edema   Assessment and Plan:   afib Recently increased despite medical therapy with amiodarone. Recent amiodarone level does not suggest that we could increase her dose further. She has been switched from coumadin to xarelto due to labile INRs Cardiac CT is reviewed.  LAA could not be visualized.  She will therefore require TEE at this time. chads2vasc score is at least 4. Therapeutic strategies for afib including medicine and ablation were discussed in detail with the patient today. Risk, benefits, and alternatives to EP study and radiofrequency ablation for afib were also discussed in detail today. These risks include but are not limited to stroke, bleeding, vascular damage, tamponade, perforation, damage to the esophagus, lungs, and other structures, pulmonary vein stenosis, worsening renal function, and death. The patient understands these risk and wishes to proceed.  Risks of TEE also discussed  Thompson Grayer MD, [facility] 07/01/2015 9:02 AM

## 2015-07-01 NOTE — Transfer of Care (Signed)
Immediate Anesthesia Transfer of Care Note  Patient: Kathryn Gibson  Procedure(s) Performed: Procedure(s): Atrial Fibrillation Ablation (N/A)  Patient Location: Cath Lab  Anesthesia Type:General  Level of Consciousness: awake, alert  and oriented  Airway & Oxygen Therapy: Patient connected to nasal cannula oxygen  Post-op Assessment: Post -op Vital signs reviewed and stable  Post vital signs: stable  Last Vitals:  Filed Vitals:   07/01/15 0955 07/01/15 1000  BP: 126/37 107/53  Pulse: 61 59  Temp:    Resp: 13 13    Last Pain: There were no vitals filed for this visit.       Complications: No apparent anesthesia complications

## 2015-07-01 NOTE — Anesthesia Postprocedure Evaluation (Signed)
Anesthesia Post Note  Patient: Kathryn Gibson  Procedure(s) Performed: Procedure(s) (LRB): Atrial Fibrillation Ablation (N/A)  Patient location during evaluation: PACU Anesthesia Type: General Level of consciousness: awake and alert Pain management: pain level controlled Vital Signs Assessment: post-procedure vital signs reviewed and stable Respiratory status: spontaneous breathing, nonlabored ventilation, respiratory function stable and patient connected to nasal cannula oxygen Cardiovascular status: blood pressure returned to baseline and stable Postop Assessment: no signs of nausea or vomiting Anesthetic complications: no    Last Vitals:  Filed Vitals:   07/01/15 1415 07/01/15 1420  BP: 119/56 112/57  Pulse: 60 59  Temp:    Resp: 11 0    Last Pain: There were no vitals filed for this visit.               Zenaida Deed

## 2015-07-01 NOTE — Progress Notes (Signed)
Holly, CRNA came to get patient to take to cath lab for ablation procedure after having TEE finished

## 2015-07-01 NOTE — Anesthesia Procedure Notes (Signed)
Procedure Name: LMA Insertion Date/Time: 07/01/2015 10:27 AM Performed by: Lavell Luster Pre-anesthesia Checklist: Patient identified, Emergency Drugs available, Suction available, Patient being monitored and Timeout performed Patient Re-evaluated:Patient Re-evaluated prior to inductionOxygen Delivery Method: Circle system utilized Preoxygenation: Pre-oxygenation with 100% oxygen Intubation Type: IV induction Ventilation: Mask ventilation without difficulty LMA: LMA inserted LMA Size: 4.0 Number of attempts: 1 Placement Confirmation: positive ETCO2 and breath sounds checked- equal and bilateral Tube secured with: Tape Dental Injury: Teeth and Oropharynx as per pre-operative assessment

## 2015-07-01 NOTE — Progress Notes (Signed)
Site area: rt fem venous sheaths 21fr, 37fr, 9fr Site Prior to Removal:  Level  0 Pressure Applied For: 30 min Manual: yes   Patient Status During Pull:  A/O Post Pull Site:  Level 0 Post Pull Instructions Given:  Instructions given and pt verbalizes understanding Post Pull Pulses Present: Rt pt 2+ Dressing Applied:  Tegaderm and 4x4 Bedrest begins @ 14:10:00 Comments:

## 2015-07-01 NOTE — Interval H&P Note (Signed)
History and Physical Interval Note:  07/01/2015 9:08 AM  Kathryn Gibson  has presented today for surgery, with the diagnosis of afib  The various methods of treatment have been discussed with the patient and family. After consideration of risks, benefits and other options for treatment, the patient has consented to  Procedure(s): TRANSESOPHAGEAL ECHOCARDIOGRAM (TEE) (N/A) as a surgical intervention .  The patient's history has been reviewed, patient examined, no change in status, stable for surgery.  I have reviewed the patient's chart and labs.  Questions were answered to the patient's satisfaction.     Fransico Him

## 2015-07-01 NOTE — Progress Notes (Signed)
  Echocardiogram Echocardiogram Transesophageal has been performed.  Jennette Dubin 07/01/2015, 10:35 AM

## 2015-07-02 ENCOUNTER — Encounter (HOSPITAL_COMMUNITY): Payer: Self-pay | Admitting: Internal Medicine

## 2015-07-02 DIAGNOSIS — I480 Paroxysmal atrial fibrillation: Secondary | ICD-10-CM

## 2015-07-02 DIAGNOSIS — I483 Typical atrial flutter: Secondary | ICD-10-CM | POA: Diagnosis not present

## 2015-07-02 DIAGNOSIS — I484 Atypical atrial flutter: Secondary | ICD-10-CM | POA: Diagnosis not present

## 2015-07-02 DIAGNOSIS — I481 Persistent atrial fibrillation: Secondary | ICD-10-CM | POA: Diagnosis not present

## 2015-07-02 MED ORDER — PANTOPRAZOLE SODIUM 40 MG PO TBEC: 40.0000 mg | DELAYED_RELEASE_TABLET | Freq: Every day | ORAL | Status: DC

## 2015-07-02 NOTE — Progress Notes (Signed)
Pt d/c home per MD order, pt VSS, pt tol well, pt to take am meds at home per pt, d/c instructions given, family at [facility], pt and family verbalized understanding of d/c, all questions answered

## 2015-07-02 NOTE — Discharge Instructions (Signed)
No driving for 1 week. No lifting over 5 lbs for 1 week. No sexual or vigorous activity for 1 week. You may return to work on 07/09/15. Keep procedure site clean & dry. If you notice increased pain, swelling, bleeding or pus, call/return!  You may shower, but no soaking baths/hot tubs/pools for 1 week.     You have an appointment set up with the [facility].  Multiple studies have shown that being followed by a dedicated atrial fibrillation clinic in addition to the standard care you receive from your other physicians improves health. We believe that enrollment in the atrial fibrillation clinic will allow [place] to better care for you.   The phone number to the [facility] is [phone]. The clinic is staffed Monday through Friday from 8:30am to 5pm.  Parking Directions: The clinic is located in the Heart and Vascular Building connected to [facility]. 1)From [address] turn on to [address] and go to the 3rd entrance  (Heart and Vascular entrance) on the right. 2)Look to the right for Heart &Vascular Parking Garage. 3)A code for the entrance is required please call the clinic to receive this.   4)Take the elevators to the 1st floor. Registration is in the room with the glass walls at the end of the hallway.  If you have any trouble parking or locating the clinic, please dont hesitate to call [phone].

## 2015-07-02 NOTE — Discharge Summary (Signed)
ELECTROPHYSIOLOGY PROCEDURE DISCHARGE SUMMARY    Patient ID: DAMITRA SANTRY,  MRN: [id number], DOB/AGE: 02/22/1933 82 y.o.  Admit date: 07/01/2015 Discharge date: 07/02/2015  Primary Care Physician: Tivis Ringer, MD Primary Cardiologist: Dr.Brackbill previously Electrophysiologist: Thompson Grayer, MD  Primary Discharge Diagnosis:  1. Paroxysmal AF     CHA2DS2Vasc is at least 4 on Xarelto  Secondary Discharge Diagnosis:  1. SSS s/p PPM 2. HTN 3. Hypothyroidism  Procedures This Admission:  1.  Electrophysiology study and radiofrequency catheter ablation on 07/01/15 by Dr Thompson Grayer.  This study demonstrated: CONCLUSIONS: 1. Atrial paced rhythm upon presentation.  2. Successful electrical isolation and anatomical encircling of all four pulmonary veins with radiofrequency current and a WACA approach. Additional left atrial ablation was performed to create a standard box lesion along the posterior wall of the left atrium. 3. Multiple atypical atrial flutter circuits observed too unstable for mapping today. Mitral isthmus and Cavo-tricuspid isthmus ablation performed today 4. Successful cardioversion to sinus rhythm 5. No early apparent complications.  Brief HPI: Kathryn Gibson is a 82 y.o. female with a history of paroxysmal atrial fibrillation.  They have failed medical therapy with amiodarone. Risks, benefits, and alternatives to catheter ablation of atrial fibrillation were reviewed with the patient who wished to proceed.  The patient underwent TEE prior to the procedure which demonstrated normal LV function and no LAA thrombus.    Hospital Course:  The patient was admitted and underwent EPS/RFCA of atrial fibrillation with details as outlined above.  They were monitored on telemetry overnight which demonstrated predominantly sinus rhythm with short afib.  Groin was without complication on the day of discharge.  The patient was examined and considered to be stable for  discharge.  Wound care and restrictions were reviewed with the patient.  The patient will be seen back by Roderic Palau, NP in 4 weeks and Dr Rayann Heman in 12 weeks for post ablation follow up.  The patient was very anxious prior to discharge, stating "I have been in afib all night".  I assured her that she had had actually very little afib.  She states "I am in afib right now".  I reassured her that she was actually in sinus.  Physical Exam: Filed Vitals:   07/02/15 0322 07/02/15 0400 07/02/15 0500 07/02/15 0600  BP: 115/60 129/60 118/65 124/57  Pulse: 64 59 63 59  Temp: 97.8 F (36.6 C) 97.8 F (36.6 C)    TempSrc: Oral Oral    Resp: 18 18 17 21   Height:      Weight:      SpO2: 99% 96% 96% 96%    GEN- The patient is anxious appearing, alert and oriented x 3 today.   HEENT: normocephalic, atraumatic; sclera clear, conjunctiva pink; hearing intact; oropharynx clear; neck supple  Lungs- Clear to ausculation bilaterally, normal work of breathing.  No wheezes, rales, rhonchi Heart- Regular rate and rhythm, no murmurs, rubs or gallops  GI- soft, non-tender, non-distended, bowel sounds present  Extremities- no clubbing, cyanosis, or edema; DP/PT/radial pulses 2+ bilaterally, groin without hematoma/bruit MS- no significant deformity or atrophy Skin- warm and dry, no rash or lesion Psych- anxious mood, full affect Neuro- strength and sensation are intact   Labs:   Lab Results  Component Value Date   WBC 7.5 07/01/2015   HGB 13.4 07/01/2015   HCT 40.9 07/01/2015   MCV 91.1 07/01/2015   PLT 201 07/01/2015     Recent Labs Lab 07/01/15 0922  NA 138  K 3.9  CL 104  CO2 26  BUN 17  CREATININE 0.83  CALCIUM 9.4  GLUCOSE 89     Discharge Medications:    Medication List    STOP taking these medications        metoprolol succinate 50 MG 24 hr tablet  Commonly known as:  TOPROL-XL      TAKE these medications        acetaminophen 500 MG tablet  Commonly known as:  TYLENOL   Take 500-1,000 mg by mouth every 6 (six) hours as needed for moderate pain or headache.     ALIGN PO  Take 1 capsule by mouth daily.     amiodarone 200 MG tablet  Commonly known as:  PACERONE  Take 0.5 tablets (100 mg total) by mouth daily.     CALTRATE 600 PLUS-VIT D PO  Take 1 tablet by mouth daily.     cholecalciferol 1000 units tablet  Commonly known as:  VITAMIN D  Take 1,000 Units by mouth daily.     diltiazem 360 MG 24 hr capsule  Commonly known as:  CARDIZEM CD  Take 1 capsule (360 mg total) by mouth daily.     Fish Oil 1200 MG Caps  Take 1 capsule by mouth daily. Mega Red.     LYSINE PO  Take 1-2 tablets by mouth daily as needed (fever blister).     multivitamin capsule  Take 1 capsule by mouth daily.     mupirocin ointment 2 %  Commonly known as:  BACTROBAN  Apply 1 application topically 3 (three) times daily.     pantoprazole 40 MG tablet  Commonly known as:  PROTONIX  Take 1 tablet (40 mg total) by mouth daily.     polyethylene glycol packet  Commonly known as:  MIRALAX / GLYCOLAX  Take 17 g by mouth 2 (two) times daily.     polyethylene glycol powder powder  Commonly known as:  GLYCOLAX/MIRALAX  Take 2 Containers by mouth daily.     Rivaroxaban 15 MG Tabs tablet  Commonly known as:  XARELTO  Take 15 mg by mouth daily with supper.     SYNTHROID 75 MCG tablet  Generic drug:  levothyroxine  Take 50-75 mcg by mouth daily before breakfast. Alternates between 50 mcg and 75 mcg daily     SYNTHROID 50 MCG tablet  Generic drug:  levothyroxine  as directed.     SYSTANE OP  Place 1-2 drops into both eyes daily.     temazepam 30 MG capsule  Commonly known as:  RESTORIL  Take 30 mg by mouth at bedtime.     vitamin C 1000 MG tablet  Take 1,000 mg by mouth daily.     warfarin 5 MG tablet  Commonly known as:  COUMADIN  Take 1 tablet (5 mg total) by mouth as directed. As directed by [facility]        Disposition:  Home  Follow-up  Information    Follow up with [facility] On 07/09/2015.   Specialty:  Cardiology   Why:  1:15PM, coumadin clinic   Contact information:   [address], [place] [address] [phone]      Duration of Discharge Encounter: Greater than 30 minutes including physician time.  Army Fossa MD  07/02/2015 8:23 AM

## 2015-07-02 NOTE — Progress Notes (Addendum)
Pt complaint of continuing afib post-ablation. HR increases to the 90's during periods of afib. Notified MD and most likely cause is inflamation post procedure. No new orders at this time.

## 2015-07-03 ENCOUNTER — Telehealth (HOSPITAL_COMMUNITY): Payer: Self-pay | Admitting: Nurse Practitioner

## 2015-07-03 NOTE — Telephone Encounter (Signed)
Pt wanted to report "soreness" in between her shoulder blades and chest discomfort when she takes deep breaths. She had afib ablation 2 days ago.  I told her that this sounds like MS pain, secondary to the procedure, and does not represent anything that she should be alarmed about. She can take Tylenol for the discomfort and should improve in a few days. She denies any exertional chest pain, shortness of breath or swallowing difficulties.She can call back tomorrow if she has ongoing concerns.

## 2015-07-04 ENCOUNTER — Other Ambulatory Visit (HOSPITAL_COMMUNITY): Payer: Self-pay | Admitting: *Deleted

## 2015-07-04 MED ORDER — RIVAROXABAN 15 MG PO TABS: 15.0000 mg | ORAL_TABLET | Freq: Every day | ORAL | Status: DC

## 2015-07-18 ENCOUNTER — Telehealth (HOSPITAL_COMMUNITY): Payer: Self-pay | Admitting: *Deleted

## 2015-07-18 LAB — CUP PACEART REMOTE DEVICE CHECK
Battery Voltage: 2.96 V
Brady Statistic AS VP Percent: 0.43 %
Brady Statistic RA Percent Paced: 95.34 %
Brady Statistic RV Percent Paced: 0.69 %
Date Time Interrogation Session: 20170522114332
Implantable Lead Implant Date: 20101109
Implantable Lead Location: 753859
Implantable Lead Model: 4469
Lead Channel Impedance Value: 416 Ohm
Lead Channel Sensing Intrinsic Amplitude: 9.707 mV
Lead Channel Setting Pacing Amplitude: 2 V
Lead Channel Setting Pacing Amplitude: 2.5 V
MDC IDC LEAD IMPLANT DT: 20101109
MDC IDC LEAD LOCATION: 753860
MDC IDC LEAD SERIAL: 535369
MDC IDC LEAD SERIAL: 657967
MDC IDC MSMT LEADCHNL RA IMPEDANCE VALUE: 504 Ohm
MDC IDC MSMT LEADCHNL RA SENSING INTR AMPL: 0.707 mV
MDC IDC SET LEADCHNL RV PACING PULSEWIDTH: 0.4 ms
MDC IDC SET LEADCHNL RV SENSING SENSITIVITY: 0.9 mV
MDC IDC STAT BRADY AP VP PERCENT: 0.26 %
MDC IDC STAT BRADY AP VS PERCENT: 95.08 %
MDC IDC STAT BRADY AS VS PERCENT: 4.23 %

## 2015-07-18 MED ORDER — METOPROLOL SUCCINATE ER 50 MG PO TB24: ORAL_TABLET | ORAL | Status: DC

## 2015-07-18 NOTE — Telephone Encounter (Signed)
Pt called in stating having afib daily since ablation. HR in low 100s and this concerns her.  Educated patient that the ablation was not a failure bc of continued afib - 3 month healing process. Discussed with Roderic Palau, NP will restart metoprolol succinate that was stopped in hospital.. Will restart at 25mg  once a day may take extra 1/2 tablet if needed for fast heart rate. Patient will call back next week to report symptoms will bring in if needed.

## 2015-07-21 ENCOUNTER — Encounter (HOSPITAL_COMMUNITY): Payer: Self-pay | Admitting: Nurse Practitioner

## 2015-07-21 ENCOUNTER — Ambulatory Visit (HOSPITAL_COMMUNITY)
Admission: RE | Admit: 2015-07-21 | Discharge: 2015-07-21 | Disposition: A | Discharge status: Home / Self Care | Payer: Medicare Other | Source: Ambulatory Visit | Attending: Nurse Practitioner | Admitting: Nurse Practitioner

## 2015-07-21 VITALS — BP 122/66 | HR 65 | Ht 61.0 in | Wt 125.2 lb

## 2015-07-21 DIAGNOSIS — E039 Hypothyroidism, unspecified: Secondary | ICD-10-CM | POA: Diagnosis not present

## 2015-07-21 DIAGNOSIS — Z950 Presence of cardiac pacemaker: Secondary | ICD-10-CM | POA: Diagnosis not present

## 2015-07-21 DIAGNOSIS — Z87891 Personal history of nicotine dependence: Secondary | ICD-10-CM | POA: Insufficient documentation

## 2015-07-21 DIAGNOSIS — K649 Unspecified hemorrhoids: Secondary | ICD-10-CM | POA: Insufficient documentation

## 2015-07-21 DIAGNOSIS — I480 Paroxysmal atrial fibrillation: Secondary | ICD-10-CM | POA: Diagnosis not present

## 2015-07-21 DIAGNOSIS — Z79899 Other long term (current) drug therapy: Secondary | ICD-10-CM | POA: Insufficient documentation

## 2015-07-21 DIAGNOSIS — K5790 Diverticulosis of intestine, part unspecified, without perforation or abscess without bleeding: Secondary | ICD-10-CM | POA: Diagnosis not present

## 2015-07-21 DIAGNOSIS — I10 Essential (primary) hypertension: Secondary | ICD-10-CM | POA: Insufficient documentation

## 2015-07-21 DIAGNOSIS — Z9889 Other specified postprocedural states: Secondary | ICD-10-CM | POA: Diagnosis not present

## 2015-07-21 DIAGNOSIS — I4891 Unspecified atrial fibrillation: Secondary | ICD-10-CM | POA: Diagnosis not present

## 2015-07-21 DIAGNOSIS — Z823 Family history of stroke: Secondary | ICD-10-CM | POA: Diagnosis not present

## 2015-07-21 DIAGNOSIS — Z800 Family history of malignant neoplasm of digestive organs: Secondary | ICD-10-CM | POA: Diagnosis not present

## 2015-07-21 DIAGNOSIS — F1099 Alcohol use, unspecified with unspecified alcohol-induced disorder: Secondary | ICD-10-CM | POA: Insufficient documentation

## 2015-07-21 DIAGNOSIS — Z801 Family history of malignant neoplasm of trachea, bronchus and lung: Secondary | ICD-10-CM | POA: Diagnosis not present

## 2015-07-21 DIAGNOSIS — Z8249 Family history of ischemic heart disease and other diseases of the circulatory system: Secondary | ICD-10-CM | POA: Diagnosis not present

## 2015-07-21 DIAGNOSIS — K589 Irritable bowel syndrome without diarrhea: Secondary | ICD-10-CM | POA: Insufficient documentation

## 2015-07-21 DIAGNOSIS — Z833 Family history of diabetes mellitus: Secondary | ICD-10-CM | POA: Diagnosis not present

## 2015-07-21 MED ORDER — METOPROLOL SUCCINATE ER 50 MG PO TB24: 50.0000 mg | ORAL_TABLET | Freq: Every day | ORAL | Status: DC

## 2015-07-21 NOTE — Patient Instructions (Signed)
Your physician has recommended you make the following change in your medication:  1)Metoprolol 50mg  once a day

## 2015-07-22 ENCOUNTER — Encounter (HOSPITAL_COMMUNITY): Payer: Self-pay | Admitting: Nurse Practitioner

## 2015-07-22 NOTE — Progress Notes (Signed)
Patient ID: Kathryn Gibson, female   DOB: 07/30/1933, 82 y.o.   MRN: [id number]     Primary Care Physician: Tivis Ringer, MD Referring Physician: Dr. Ezzard Flax is a 82 y.o. female with a h/o afib s/p afib ablation 07/01/15. She asked to be seen for epiosdes of heart racing since procedure. She has returned to her exercise and feels well exercising, never notices heart irregularity at those times. BB was stopped after ablation but she has retuirned to 1/2 tab this past week which has helped but feels that she requires the full 50 mg tab. PPM interrogated and shows episodes of abtach and aflutter but no afib. Denies any swallowing difficulties or groin issues from procedure.  Today, she denies symptoms of palpitations, chest pain, shortness of breath, orthopnea, PND, lower extremity edema, dizziness, presyncope, syncope, or neurologic sequela. The patient is tolerating medications without difficulties and is otherwise without complaint today.   Past Medical History  Diagnosis Date  . Atrial fibrillation ([place])   . Hypertension   . Hypothyroidism   . Irritable bowel syndrome (IBS)   . Constipation   . Colonic polyp     adenomatous  . Diverticulosis   . Bradycardia     s/p PPM  . Hemorrhoids   . Arthritis   . Cataract   . Pacemaker 2010   Past Surgical History  Procedure Laterality Date  . Pacemaker insertion  2010    implanted by Dr Doreatha Lew (MDT)  . Colonoscopy    . Nasal endoscopy with epistaxis control N/A 07/14/2014    Procedure: NASAL ENDOSCOPY WITH EPISTAXIS CONTROL;  Surgeon: Ruby Cola, MD;  Location: [facility] OR;  Service: ENT;  Laterality: N/A;  . Radiology with anesthesia N/A 07/14/2014    Procedure: RADIOLOGY WITH ANESTHESIA;  Surgeon: Medication Radiologist, MD;  Location: [place];  Service: Radiology;  Laterality: N/A;  . Hematoma evacuation Right 07/15/2014    Procedure: EVACUATION Right Groin Hematoma;  Surgeon: Rosetta Posner, MD;  Location: [place];  Service:  Vascular;  Laterality: Right;  . Nasal endoscopy with epistaxis control Bilateral 07/15/2014    Procedure: NASAL ENDOSCOPY WITH EPISTAXIS CONTROL;  Surgeon: Ruby Cola, MD;  Location: [place];  Service: ENT;  Laterality: Bilateral;  . Tee without cardioversion N/A 07/01/2015    Procedure: TRANSESOPHAGEAL ECHOCARDIOGRAM (TEE);  Surgeon: Sueanne Margarita, MD;  Location: [facility] ENDOSCOPY;  Service: Cardiovascular;  Laterality: N/A;  . Electrophysiologic study N/A 07/01/2015    Procedure: Atrial Fibrillation Ablation;  Surgeon: Thompson Grayer, MD;  Location: [place] CV LAB;  Service: Cardiovascular;  Laterality: N/A;    Current Outpatient Prescriptions  Medication Sig Dispense Refill  . acetaminophen (TYLENOL) 500 MG tablet Take 500-1,000 mg by mouth every 6 (six) hours as needed for moderate pain or headache.    Marland Kitchen amiodarone (PACERONE) 200 MG tablet Take 0.5 tablets (100 mg total) by mouth daily. 45 tablet 2  . Ascorbic Acid (VITAMIN C) 1000 MG tablet Take 1,000 mg by mouth daily.      . Calcium-Vitamin D (CALTRATE 600 PLUS-VIT D PO) Take 1 tablet by mouth daily.    . cholecalciferol (VITAMIN D) 1000 UNITS tablet Take 1,000 Units by mouth daily.      Marland Kitchen diltiazem (CARDIZEM CD) 360 MG 24 hr capsule Take 1 capsule (360 mg total) by mouth daily. 90 capsule 3  . LYSINE PO Take 1-2 tablets by mouth daily as needed (fever blister).    . metoprolol succinate (TOPROL XL)  50 MG 24 hr tablet Take 1 tablet (50 mg total) by mouth daily.    . Multiple Vitamin (MULTIVITAMIN) capsule Take 1 capsule by mouth daily.      . mupirocin ointment (BACTROBAN) 2 % Apply 1 application topically 3 (three) times daily.   0  . Omega-3 Fatty Acids (FISH OIL) 1200 MG CAPS Take 1 capsule by mouth daily. Mega Red.    . pantoprazole (PROTONIX) 40 MG tablet Take 1 tablet (40 mg total) by mouth daily. 45 tablet 0  . Polyethyl Glycol-Propyl Glycol (SYSTANE OP) Place 1-2 drops into both eyes daily.     . polyethylene glycol (MIRALAX /  GLYCOLAX) packet Take 17 g by mouth 2 (two) times daily.    . polyethylene glycol powder (GLYCOLAX/MIRALAX) powder Take 2 Containers by mouth daily.   0  . Probiotic Product (ALIGN PO) Take 1 capsule by mouth daily.     . Rivaroxaban (XARELTO) 15 MG TABS tablet Take 1 tablet (15 mg total) by mouth daily with supper. 30 tablet 6  . SYNTHROID 50 MCG tablet as directed.  7  . SYNTHROID 75 MCG tablet Take 50-75 mcg by mouth daily before breakfast. Alternates between 50 mcg and 75 mcg daily  2  . temazepam (RESTORIL) 30 MG capsule Take 30 mg by mouth at bedtime.      No current facility-administered medications for this encounter.    No Known Allergies  Social History   Social History  . Marital Status: Widowed    Spouse Name: N/A  . Number of Children: 3  . Years of Education: N/A   Occupational History  . retired    Social History Main Topics  . Smoking status: Former Smoker    Types: Cigarettes    Quit date: 02/09/1980  . Smokeless tobacco: Never Used  . Alcohol Use: 8.4 oz/week    14 Glasses of wine per week  . Drug Use: No  . Sexual Activity: Not on file   Other Topics Concern  . Not on file   Social History Narrative    Family History  Problem Relation Age of Onset  . Heart failure Mother   . Heart attack Father   . Colon cancer Neg Hx   . Diabetes Paternal Grandfather   . Kidney disease Neg Hx   . Liver disease Neg Hx   . Esophageal cancer Neg Hx   . Heart disease Brother   . Lung cancer Son   . Stroke Neg Hx   . Hypertension Mother     ROS- All systems are reviewed and negative except as per the HPI above  Physical Exam: Filed Vitals:   07/21/15 1540  BP: 122/66  Pulse: 65  Height: 5\' 1"  (1.549 m)  Weight: 125 lb 3.2 oz (56.79 kg)    GEN- The patient is well appearing, alert and oriented x 3 today.   Head- normocephalic, atraumatic Eyes-  Sclera clear, conjunctiva pink Ears- hearing intact Oropharynx- clear Neck- supple, no JVP Lymph- no  cervical lymphadenopathy Lungs- Clear to ausculation bilaterally, normal work of breathing Heart- paced rate and rhythm, no murmurs, rubs or gallops, PMI not laterally displaced GI- soft, NT, ND, + BS Extremities- no clubbing, cyanosis, or edema MS- no significant deformity or atrophy Skin- no rash or lesion Psych- euthymic mood, full affect Neuro- strength and sensation are intact  EKG-atrial paced rhythm with prolonged av conduction, pr int 249 ms, qrs int 94 ms, qtc 461 ms Epic records reviewed PPM interrogated  with normal functioning device by Tomi Bamberger, MDT rep, periods of atach/aflutter noted, no afib     Assessment and Plan: 1. S/p afib ablation with intermittent epsoed of atach/aflutter Increase toprol to 50 mg qd Continue amiodarone at 100 mg qd Continue xarelto at 15 mg qd  F/u as scheduled 5/26 and will interrogate device at that time  Butch Penny C. Guliana Weyandt, [facility] [address] [place], [place] [address] [phone]

## 2015-07-24 ENCOUNTER — Other Ambulatory Visit (HOSPITAL_COMMUNITY): Payer: Self-pay | Admitting: *Deleted

## 2015-07-24 ENCOUNTER — Encounter: Payer: Self-pay | Admitting: Cardiology

## 2015-07-24 MED ORDER — METOPROLOL SUCCINATE ER 50 MG PO TB24: 50.0000 mg | ORAL_TABLET | Freq: Every day | ORAL | Status: DC

## 2015-08-04 ENCOUNTER — Ambulatory Visit (HOSPITAL_COMMUNITY)
Admit: 2015-08-04 | Discharge: 2015-08-04 | Disposition: A | Discharge status: Home / Self Care | Payer: Medicare Other | Attending: Nurse Practitioner | Admitting: Nurse Practitioner

## 2015-08-04 ENCOUNTER — Encounter (HOSPITAL_COMMUNITY): Payer: Self-pay | Admitting: Nurse Practitioner

## 2015-08-04 VITALS — BP 138/72 | HR 100 | Ht 61.0 in | Wt 126.0 lb

## 2015-08-04 DIAGNOSIS — K5900 Constipation, unspecified: Secondary | ICD-10-CM | POA: Insufficient documentation

## 2015-08-04 DIAGNOSIS — I498 Other specified cardiac arrhythmias: Secondary | ICD-10-CM | POA: Insufficient documentation

## 2015-08-04 DIAGNOSIS — M1990 Unspecified osteoarthritis, unspecified site: Secondary | ICD-10-CM | POA: Diagnosis not present

## 2015-08-04 DIAGNOSIS — K589 Irritable bowel syndrome without diarrhea: Secondary | ICD-10-CM | POA: Diagnosis not present

## 2015-08-04 DIAGNOSIS — R001 Bradycardia, unspecified: Secondary | ICD-10-CM | POA: Diagnosis not present

## 2015-08-04 DIAGNOSIS — K635 Polyp of colon: Secondary | ICD-10-CM | POA: Diagnosis not present

## 2015-08-04 DIAGNOSIS — K5790 Diverticulosis of intestine, part unspecified, without perforation or abscess without bleeding: Secondary | ICD-10-CM | POA: Diagnosis not present

## 2015-08-04 DIAGNOSIS — Z950 Presence of cardiac pacemaker: Secondary | ICD-10-CM | POA: Diagnosis not present

## 2015-08-04 DIAGNOSIS — R9431 Abnormal electrocardiogram [ECG] [EKG]: Secondary | ICD-10-CM | POA: Insufficient documentation

## 2015-08-04 DIAGNOSIS — I471 Supraventricular tachycardia: Secondary | ICD-10-CM | POA: Diagnosis not present

## 2015-08-04 DIAGNOSIS — H269 Unspecified cataract: Secondary | ICD-10-CM | POA: Diagnosis not present

## 2015-08-04 DIAGNOSIS — Z87891 Personal history of nicotine dependence: Secondary | ICD-10-CM | POA: Insufficient documentation

## 2015-08-04 DIAGNOSIS — E039 Hypothyroidism, unspecified: Secondary | ICD-10-CM | POA: Insufficient documentation

## 2015-08-04 DIAGNOSIS — Z7901 Long term (current) use of anticoagulants: Secondary | ICD-10-CM | POA: Insufficient documentation

## 2015-08-04 DIAGNOSIS — I10 Essential (primary) hypertension: Secondary | ICD-10-CM | POA: Diagnosis not present

## 2015-08-04 DIAGNOSIS — I4891 Unspecified atrial fibrillation: Secondary | ICD-10-CM | POA: Insufficient documentation

## 2015-08-04 MED ORDER — METOPROLOL SUCCINATE ER 50 MG PO TB24: ORAL_TABLET | ORAL | Status: DC

## 2015-08-04 NOTE — Progress Notes (Signed)
Patient ID: Kathryn Gibson, female   DOB: 1933-06-26, 82 y.o.   MRN: [id number]           Primary Care Physician: Tivis Ringer, MD Referring Physician: Dr. Ezzard Flax is a 82 y.o. female with a h/o afib, s/p afib ablation 07/01/15. She asked to be seen for epiosdes of heart racing since procedure. She has returned to her exercise and feels well exercising, never notices heart irregularity at those times. BB was stopped after ablation but she has retuirned to 1/2 tab this past week which has helped but feels that she requires the full 50 mg tab. PPM interrogated and shows episodes of atach and aflutter but no afib. Denies any swallowing difficulties or groin issues from procedure.  Returns 6/26, c/o of "more afib than ever." Device interrogated and last afib she had was 28 mins on 6/20. Other than that, she has some sinus tach up to 100-110 bpm. EKG shows SR today at 100 bpm. She is upset that she has gained 2 lbs over the last month. Thinks PPI is responsible. No swallowing or groin issues.Exercising regularly with no heart rate issues with exercise.  Today, she denies symptoms of   chest pain, shortness of breath, orthopnea, PND, lower extremity edema, dizziness, presyncope, syncope, or neurologic sequela. The patient is tolerating medications without difficulties and is otherwise without complaint today.   Past Medical History  Diagnosis Date  . Atrial fibrillation ([place])   . Hypertension   . Hypothyroidism   . Irritable bowel syndrome (IBS)   . Constipation   . Colonic polyp     adenomatous  . Diverticulosis   . Bradycardia     s/p PPM  . Hemorrhoids   . Arthritis   . Cataract   . Pacemaker 2010   Past Surgical History  Procedure Laterality Date  . Pacemaker insertion  2010    implanted by Dr Doreatha Lew (MDT)  . Colonoscopy    . Nasal endoscopy with epistaxis control N/A 07/14/2014    Procedure: NASAL ENDOSCOPY WITH EPISTAXIS CONTROL;  Surgeon: Ruby Cola,  MD;  Location: [facility] OR;  Service: ENT;  Laterality: N/A;  . Radiology with anesthesia N/A 07/14/2014    Procedure: RADIOLOGY WITH ANESTHESIA;  Surgeon: Medication Radiologist, MD;  Location: [place];  Service: Radiology;  Laterality: N/A;  . Hematoma evacuation Right 07/15/2014    Procedure: EVACUATION Right Groin Hematoma;  Surgeon: Rosetta Posner, MD;  Location: [place];  Service: Vascular;  Laterality: Right;  . Nasal endoscopy with epistaxis control Bilateral 07/15/2014    Procedure: NASAL ENDOSCOPY WITH EPISTAXIS CONTROL;  Surgeon: Ruby Cola, MD;  Location: [place];  Service: ENT;  Laterality: Bilateral;  . Tee without cardioversion N/A 07/01/2015    Procedure: TRANSESOPHAGEAL ECHOCARDIOGRAM (TEE);  Surgeon: Sueanne Margarita, MD;  Location: [facility] ENDOSCOPY;  Service: Cardiovascular;  Laterality: N/A;  . Electrophysiologic study N/A 07/01/2015    Procedure: Atrial Fibrillation Ablation;  Surgeon: Thompson Grayer, MD;  Location: [place] CV LAB;  Service: Cardiovascular;  Laterality: N/A;    Current Outpatient Prescriptions  Medication Sig Dispense Refill  . acetaminophen (TYLENOL) 500 MG tablet Take 500-1,000 mg by mouth every 6 (six) hours as needed for moderate pain or headache.    Marland Kitchen amiodarone (PACERONE) 200 MG tablet Take 0.5 tablets (100 mg total) by mouth daily. 45 tablet 2  . Ascorbic Acid (VITAMIN C) 1000 MG tablet Take 1,000 mg by mouth daily.      Marland Kitchen  Calcium-Vitamin D (CALTRATE 600 PLUS-VIT D PO) Take 1 tablet by mouth daily.    . cholecalciferol (VITAMIN D) 1000 UNITS tablet Take 1,000 Units by mouth daily.      Marland Kitchen diltiazem (CARDIZEM CD) 360 MG 24 hr capsule Take 1 capsule (360 mg total) by mouth daily. 90 capsule 3  . LYSINE PO Take 1-2 tablets by mouth daily as needed (fever blister).    . metoprolol succinate (TOPROL XL) 50 MG 24 hr tablet Take 1 tablet (50mg ) by mouth in the morning and 1/2 tablet (25mg ) in the evening. 30 tablet 6  . Multiple Vitamin (MULTIVITAMIN) capsule Take 1 capsule by  mouth daily.      . mupirocin ointment (BACTROBAN) 2 % Apply 1 application topically 3 (three) times daily.   0  . Omega-3 Fatty Acids (FISH OIL) 1200 MG CAPS Take 1 capsule by mouth daily. Mega Red.    . pantoprazole (PROTONIX) 40 MG tablet Take 1 tablet (40 mg total) by mouth daily. 45 tablet 0  . Polyethyl Glycol-Propyl Glycol (SYSTANE OP) Place 1-2 drops into both eyes daily.     . polyethylene glycol (MIRALAX / GLYCOLAX) packet Take 17 g by mouth 2 (two) times daily.    . polyethylene glycol powder (GLYCOLAX/MIRALAX) powder Take 2 Containers by mouth daily.   0  . Probiotic Product (ALIGN PO) Take 1 capsule by mouth daily.     . Rivaroxaban (XARELTO) 15 MG TABS tablet Take 1 tablet (15 mg total) by mouth daily with supper. 30 tablet 6  . SYNTHROID 50 MCG tablet as directed.  7  . SYNTHROID 75 MCG tablet Take 50-75 mcg by mouth daily before breakfast. Alternates between 50 mcg and 75 mcg daily  2  . temazepam (RESTORIL) 30 MG capsule Take 30 mg by mouth at bedtime.      No current facility-administered medications for this encounter.    No Known Allergies  Social History   Social History  . Marital Status: Widowed    Spouse Name: N/A  . Number of Children: 3  . Years of Education: N/A   Occupational History  . retired    Social History Main Topics  . Smoking status: Former Smoker    Types: Cigarettes    Quit date: 02/09/1980  . Smokeless tobacco: Never Used  . Alcohol Use: 8.4 oz/week    14 Glasses of wine per week  . Drug Use: No  . Sexual Activity: Not on file   Other Topics Concern  . Not on file   Social History Narrative    Family History  Problem Relation Age of Onset  . Heart failure Mother   . Heart attack Father   . Colon cancer Neg Hx   . Diabetes Paternal Grandfather   . Kidney disease Neg Hx   . Liver disease Neg Hx   . Esophageal cancer Neg Hx   . Heart disease Brother   . Lung cancer Son   . Stroke Neg Hx   . Hypertension Mother     ROS-  All systems are reviewed and negative except as per the HPI above  Physical Exam: Filed Vitals:   08/04/15 1205  BP: 138/72  Pulse: 100  Height: 5\' 1"  (1.549 m)  Weight: 126 lb (57.153 kg)    GEN- The patient is well appearing, alert and oriented x 3 today.   Head- normocephalic, atraumatic Eyes-  Sclera clear, conjunctiva pink Ears- hearing intact Oropharynx- clear Neck- supple, no JVP Lymph- no  cervical lymphadenopathy Lungs- Clear to ausculation bilaterally, normal work of breathing Heart-regular rate and rhythm, no murmurs, rubs or gallops, PMI not laterally displaced GI- soft, NT, ND, + BS Extremities- no clubbing, cyanosis, or edema MS- no significant deformity or atrophy Skin- no rash or lesion Psych- euthymic mood, full affect Neuro- strength and sensation are intact  EKG-SR at 100 bpm, qrs int 84 ms, qtc 487 ms Epic records reviewed PPM interrogated with normal functioning device by Sharee Pimple, MDT rep, one episode of afib x 28 mins on 6/20 otherwise some stach at 90-110 bpm     Assessment and Plan: 1. S/p afib ablation  Very little afib burden Some Sinus tach  Increase toprol to 50 mg am and 1/2 tab pm Continue amiodarone at 100 mg qd Continue xarelto at 15 mg qd Due to have thyroid panel drawn in July Instructed that she could stop PPI if so desired but she thinks she will finish out the 45 day RX   F/u as scheduled Dr. Oval Linsey 7/19 and Dr. Rayann Heman 8/24    Geroge Baseman. Twilla Khouri, [facility] [address] [place], [place] [address] [phone]

## 2015-08-20 DIAGNOSIS — E038 Other specified hypothyroidism: Secondary | ICD-10-CM | POA: Diagnosis not present

## 2015-08-20 DIAGNOSIS — Z79899 Other long term (current) drug therapy: Secondary | ICD-10-CM | POA: Diagnosis not present

## 2015-08-20 DIAGNOSIS — I10 Essential (primary) hypertension: Secondary | ICD-10-CM | POA: Diagnosis not present

## 2015-08-20 DIAGNOSIS — Z6824 Body mass index (BMI) 24.0-24.9, adult: Secondary | ICD-10-CM | POA: Diagnosis not present

## 2015-08-20 DIAGNOSIS — I480 Paroxysmal atrial fibrillation: Secondary | ICD-10-CM | POA: Diagnosis not present

## 2015-08-20 DIAGNOSIS — M8580 Other specified disorders of bone density and structure, unspecified site: Secondary | ICD-10-CM | POA: Diagnosis not present

## 2015-08-22 ENCOUNTER — Telehealth: Payer: Self-pay | Admitting: Cardiovascular Disease

## 2015-08-22 NOTE — Telephone Encounter (Signed)
Received records from [facility] for appointment on 08/27/15 with Dr Oval Linsey.  Records given to [facility] (medical records) for Dr Blenda Mounts schedule on 08/27/15. lp

## 2015-08-27 ENCOUNTER — Encounter: Payer: Self-pay | Admitting: Cardiovascular Disease

## 2015-08-27 ENCOUNTER — Ambulatory Visit (INDEPENDENT_AMBULATORY_CARE_PROVIDER_SITE_OTHER): Payer: Medicare Other | Admitting: Cardiovascular Disease

## 2015-08-27 VITALS — BP 136/68 | HR 64 | Ht 61.0 in | Wt 125.2 lb

## 2015-08-27 DIAGNOSIS — I259 Chronic ischemic heart disease, unspecified: Secondary | ICD-10-CM

## 2015-08-27 DIAGNOSIS — E785 Hyperlipidemia, unspecified: Secondary | ICD-10-CM

## 2015-08-27 DIAGNOSIS — I4891 Unspecified atrial fibrillation: Secondary | ICD-10-CM

## 2015-08-27 DIAGNOSIS — I480 Paroxysmal atrial fibrillation: Secondary | ICD-10-CM

## 2015-08-27 DIAGNOSIS — I351 Nonrheumatic aortic (valve) insufficiency: Secondary | ICD-10-CM | POA: Diagnosis not present

## 2015-08-27 MED ORDER — ROSUVASTATIN CALCIUM 10 MG PO TABS: 10.0000 mg | ORAL_TABLET | Freq: Every day | ORAL | Status: DC

## 2015-08-27 NOTE — Patient Instructions (Signed)
Your physician recommends that you return for lab work in 6 weeks.   Your physician recommends that you schedule a follow-up appointment in: 3 months with Dr. Oval Linsey.  If you need a refill on your cardiac medications before your next appointment, please call your pharmacy.

## 2015-08-27 NOTE — Progress Notes (Signed)
Cardiology Office Note   Date:  08/29/2015   ID:  [name], DOB [birth year], MRN [id number]  PCP:  Tivis Ringer, MD  Cardiologist:   [name], MD  Electrophysiologist: Thompson Grayer  Chief Complaint  Patient presents with  . Follow-up     History of Present Illness: Kathryn Gibson is a 82 y.o. female with atrial fibrillation s/p ablation, sss s/p PPM,  hypertension, who presents for follow-up.  Kathryn Gibson  was previously a patient of Dr. Mare Ferrari.  She was last seen 12/2014 at which time she was having increased atrial fibrillation. She was referred to electrophysiology and underwent ablation for atrial fibrillation on 07/01/15.  Since then she continues to have palpitations.  She tried to stop metoprolol but continued to have palpitations so it was restarted.  She was seen in atrial fibrillation clinic where her PPM showed episodes of atrial tachycardia and some atrial fibrillation, most recently 6/20 for 28 minutes.  Metoprolol was increased to 50 mg am and 25 mg pm on 6/26 and amiodarone was continued.  She reports not feeling any different than she did prior to the ablation.  She feels 3-4 short episodes of palpitations daily.  She denies shortness of breath, lightheadedness or dizziness.  The episodes typically occur at rest.  Kathryn Gibson exercises regularly with swimming, walking, doing aerobics and yoga.  She doesn't notice the palpitations when exercising.  She has not noticed any lower extremity edema, orthopnea or PND.  Past Medical History  Diagnosis Date  . Atrial fibrillation ([place])   . Hypertension   . Hypothyroidism   . Irritable bowel syndrome (IBS)   . Constipation   . Colonic polyp     adenomatous  . Diverticulosis   . Bradycardia     s/p PPM  . Hemorrhoids   . Arthritis   . Cataract   . Pacemaker 2010    Past Surgical History  Procedure Laterality Date  . Pacemaker insertion  2010    implanted by Dr Doreatha Lew (MDT)  . Colonoscopy    .  Nasal endoscopy with epistaxis control N/A 07/14/2014    Procedure: NASAL ENDOSCOPY WITH EPISTAXIS CONTROL;  Surgeon: Ruby Cola, MD;  Location: [facility] OR;  Service: ENT;  Laterality: N/A;  . Radiology with anesthesia N/A 07/14/2014    Procedure: RADIOLOGY WITH ANESTHESIA;  Surgeon: Medication Radiologist, MD;  Location: [place];  Service: Radiology;  Laterality: N/A;  . Hematoma evacuation Right 07/15/2014    Procedure: EVACUATION Right Groin Hematoma;  Surgeon: Rosetta Posner, MD;  Location: [place];  Service: Vascular;  Laterality: Right;  . Nasal endoscopy with epistaxis control Bilateral 07/15/2014    Procedure: NASAL ENDOSCOPY WITH EPISTAXIS CONTROL;  Surgeon: Ruby Cola, MD;  Location: [place];  Service: ENT;  Laterality: Bilateral;  . Tee without cardioversion N/A 07/01/2015    Procedure: TRANSESOPHAGEAL ECHOCARDIOGRAM (TEE);  Surgeon: Sueanne Margarita, MD;  Location: [facility] ENDOSCOPY;  Service: Cardiovascular;  Laterality: N/A;  . Electrophysiologic study N/A 07/01/2015    Procedure: Atrial Fibrillation Ablation;  Surgeon: Thompson Grayer, MD;  Location: [place] CV LAB;  Service: Cardiovascular;  Laterality: N/A;     Current Outpatient Prescriptions  Medication Sig Dispense Refill  . acetaminophen (TYLENOL) 500 MG tablet Take 500-1,000 mg by mouth every 6 (six) hours as needed for moderate pain or headache.    Marland Kitchen amiodarone (PACERONE) 200 MG tablet Take 0.5 tablets (100 mg total) by mouth daily. 45 tablet 2  . Ascorbic Acid (  VITAMIN C) 1000 MG tablet Take 1,000 mg by mouth daily.      . Calcium-Vitamin D (CALTRATE 600 PLUS-VIT D PO) Take 1 tablet by mouth daily.    . cholecalciferol (VITAMIN D) 1000 UNITS tablet Take 1,000 Units by mouth daily.      Marland Kitchen diltiazem (CARDIZEM CD) 360 MG 24 hr capsule Take 1 capsule (360 mg total) by mouth daily. 90 capsule 3  . LYSINE PO Take 1-2 tablets by mouth daily as needed (fever blister).    . metoprolol succinate (TOPROL XL) 50 MG 24 hr tablet Take 1 tablet (50mg ) by  mouth in the morning and 1/2 tablet (25mg ) in the evening. 30 tablet 6  . Multiple Vitamin (MULTIVITAMIN) capsule Take 1 capsule by mouth daily.      . mupirocin ointment (BACTROBAN) 2 % Apply 1 application topically 3 (three) times daily.   0  . Omega-3 Fatty Acids (FISH OIL) 1200 MG CAPS Take 1 capsule by mouth daily. Mega Red.    . polyethylene glycol powder (GLYCOLAX/MIRALAX) powder Take 2 Containers by mouth daily.   0  . Probiotic Product (ALIGN PO) Take 1 capsule by mouth daily.     . Rivaroxaban (XARELTO) 15 MG TABS tablet Take 1 tablet (15 mg total) by mouth daily with supper. 30 tablet 6  . SYNTHROID 50 MCG tablet as directed.  7  . SYNTHROID 75 MCG tablet Take 50-75 mcg by mouth daily before breakfast. Alternates between 50 mcg and 75 mcg daily  2  . temazepam (RESTORIL) 30 MG capsule Take 30 mg by mouth at bedtime.     . rosuvastatin (CRESTOR) 10 MG tablet Take 1 tablet (10 mg total) by mouth daily. 30 tablet 3   No current facility-administered medications for this visit.    Allergies:   Review of patient's allergies indicates no known allergies.    Social History:  The patient  reports that she quit smoking about 35 years ago. Her smoking use included Cigarettes. She has never used smokeless tobacco. She reports that she drinks about 8.4 oz of alcohol per week. She reports that she does not use illicit drugs.   Family History:  The patient's family history includes Diabetes in her paternal grandfather; Heart attack in her father; Heart failure in her mother; Hypertension in her mother; Lung cancer in her son. There is no history of Colon cancer, Kidney disease, Liver disease, Esophageal cancer, or Stroke.    ROS:  Please see the history of present illness.   Otherwise, review of systems are positive for none.   All other systems are reviewed and negative.    PHYSICAL EXAM: VS:  BP 136/68 mmHg  Pulse 64  Ht 5\' 1"  (1.549 m)  Wt 125 lb 3.2 oz (56.79 kg)  BMI 23.67 kg/m2 ,  BMI Body mass index is 23.67 kg/(m^2). GENERAL:  Well appearing HEENT:  Pupils equal round and reactive, fundi not visualized, oral mucosa unremarkable NECK:  No jugular venous distention, waveform within normal limits, carotid upstroke brisk and symmetric, no bruits, no thyromegaly LYMPHATICS:  No cervical adenopathy LUNGS:  Clear to auscultation bilaterally HEART:  RRR.  PMI not displaced or sustained,S1 and S2 within normal limits, no S3, no S4, no clicks, no rubs, no murmurs ABD:  Flat, positive bowel sounds normal in frequency in pitch, no bruits, no rebound, no guarding, no midline pulsatile mass, no hepatomegaly, no splenomegaly EXT:  2 plus pulses throughout, no edema, no cyanosis no clubbing SKIN:  No  rashes no nodules NEURO:  Cranial nerves II through XII grossly intact, motor grossly intact throughout PSYCH:  Cognitively intact, oriented to person place and time   EKG:  EKG is not ordered today.   Echo 06/05/15: Study Conclusions  - Left ventricle: The cavity size was normal. Wall thickness was  normal. Systolic function was normal. The estimated ejection  fraction was in the range of 55% to 60%. Wall motion was normal;  there were no regional wall motion abnormalities. - Aortic valve: Trileaflet; mildly thickened, mildly calcified  leaflets. There was mild regurgitation. - Mitral valve: Calcified annulus. - Left atrium: The atrium was mildly dilated. - Right ventricle: Pacer wire or catheter noted in right ventricle.  Impressions:  - Compared to the prior study, there has been no significant  interval change.   Recent Labs: 07/01/2015: BUN 17; Creatinine, Ser 0.83; Hemoglobin 13.4; Platelets 201; Potassium 3.9; Sodium 138  02/28/15: TSH 7.24, free T4 130  Lipid Panel No results found for: CHOL, TRIG, HDL, CHOLHDL, VLDL, LDLCALC, LDLDIRECT    Wt Readings from Last 3 Encounters:  08/27/15 125 lb 3.2 oz (56.79 kg)  08/04/15 126 lb (57.153 kg)  07/21/15  125 lb 3.2 oz (56.79 kg)      ASSESSMENT AND PLAN:  # Atrial fibrillation: Ms. Zauner is s/p ablation and has minimal AF burden on her device interrogation.  She is within the 3 month window and it is still unclear how successful it will be.  Will defer management to Dr. Rayann Heman and his team.  Continue amiodarone, diltiazem, metoprolol and Xarelto.  This patients CHA2DS2-VASc Score and unadjusted Ischemic Stroke Rate (% per year) is equal to 4.8 % stroke rate/year from a score of 4 Above score calculated as 1 point each if present [CHF, HTN, DM, Vascular=MI/PAD/Aortic Plaque, Age if 65-74, or Female] Above score calculated as 2 points each if present [Age > 75, or Stroke/TIA/TE]  # Hyperlipidemia: ASCVD 10 year risk is 34%.  We will start rosuvastatin 10 mg daily. She will have lipids and LFTs checked in 6 weeks.  # Asymptomatic coronary calcification: Ms. Dolan had a chest CT pre-ablation that noted coronary calcification.  Her coronary calcium score was 188.  We are starting rosuvastatin as above.  No aspirin given that she is on Xarelto.  She is asymptomatic so we will not pursue an ischemia evaluation at this time.    Current medicines are reviewed at length with the patient today.  The patient does not have concerns regarding medicines.  The following changes have been made:  Start rosuvastatin 10 mg daily  Labs/ tests ordered today include:   Orders Placed This Encounter  Procedures  . Lipid panel  . Hepatic function panel     Disposition:   FU with Arieona Swaggerty C. Oval Linsey, MD, [facility] in 3 months   Time spent: 30 minutes-Greater than 50% of this time was spent in counseling, explanation of diagnosis, planning of further management, and coordination of care.   This note was written with the assistance of speech recognition software.  Please excuse any transcriptional errors.  Signed, Dezaria Methot C. Oval Linsey, MD, [facility]  08/29/2015 6:04 PM    [facility]  [facility] thinks the myasthenia antibody regularly. Be called if

## 2015-08-29 ENCOUNTER — Encounter: Payer: Self-pay | Admitting: Cardiovascular Disease

## 2015-09-03 DIAGNOSIS — M9903 Segmental and somatic dysfunction of lumbar region: Secondary | ICD-10-CM | POA: Diagnosis not present

## 2015-09-03 DIAGNOSIS — M5116 Intervertebral disc disorders with radiculopathy, lumbar region: Secondary | ICD-10-CM | POA: Diagnosis not present

## 2015-09-09 ENCOUNTER — Other Ambulatory Visit (HOSPITAL_COMMUNITY): Payer: Self-pay | Admitting: *Deleted

## 2015-09-09 MED ORDER — METOPROLOL SUCCINATE ER 50 MG PO TB24: ORAL_TABLET | ORAL | 6 refills | Status: DC

## 2015-09-17 ENCOUNTER — Encounter: Payer: Self-pay | Admitting: Internal Medicine

## 2015-09-17 DIAGNOSIS — Z961 Presence of intraocular lens: Secondary | ICD-10-CM | POA: Diagnosis not present

## 2015-09-17 DIAGNOSIS — H02423 Myogenic ptosis of bilateral eyelids: Secondary | ICD-10-CM | POA: Diagnosis not present

## 2015-09-17 DIAGNOSIS — H35372 Puckering of macula, left eye: Secondary | ICD-10-CM | POA: Diagnosis not present

## 2015-10-02 ENCOUNTER — Ambulatory Visit (INDEPENDENT_AMBULATORY_CARE_PROVIDER_SITE_OTHER): Payer: Medicare Other | Admitting: Internal Medicine

## 2015-10-02 ENCOUNTER — Encounter: Payer: Self-pay | Admitting: Internal Medicine

## 2015-10-02 VITALS — BP 126/72 | HR 68 | Ht 61.0 in | Wt 125.8 lb

## 2015-10-02 DIAGNOSIS — I495 Sick sinus syndrome: Secondary | ICD-10-CM

## 2015-10-02 DIAGNOSIS — I480 Paroxysmal atrial fibrillation: Secondary | ICD-10-CM | POA: Diagnosis not present

## 2015-10-02 LAB — CUP PACEART INCLINIC DEVICE CHECK
Battery Voltage: 2.96 V
Brady Statistic AP VP Percent: 0.09 %
Brady Statistic AS VS Percent: 20.45 %
Date Time Interrogation Session: 20170824150007
Implantable Lead Implant Date: 20101109
Implantable Lead Location: 753860
Lead Channel Pacing Threshold Amplitude: 0.5 V
Lead Channel Pacing Threshold Amplitude: 1 V
Lead Channel Pacing Threshold Pulse Width: 0.5 ms
Lead Channel Sensing Intrinsic Amplitude: 9.707 mV
Lead Channel Setting Pacing Amplitude: 2 V
Lead Channel Setting Pacing Amplitude: 2.5 V
Lead Channel Setting Pacing Pulse Width: 0.4 ms
Lead Channel Setting Sensing Sensitivity: 0.9 mV
MDC IDC LEAD IMPLANT DT: 20101109
MDC IDC LEAD LOCATION: 753859
MDC IDC LEAD SERIAL: 535369
MDC IDC LEAD SERIAL: 657967
MDC IDC MSMT LEADCHNL RA IMPEDANCE VALUE: 496 Ohm
MDC IDC MSMT LEADCHNL RA PACING THRESHOLD PULSEWIDTH: 0.4 ms
MDC IDC MSMT LEADCHNL RA SENSING INTR AMPL: 1.06 mV
MDC IDC MSMT LEADCHNL RV IMPEDANCE VALUE: 432 Ohm
MDC IDC STAT BRADY AP VS PERCENT: 79.26 %
MDC IDC STAT BRADY AS VP PERCENT: 0.2 %
MDC IDC STAT BRADY RA PERCENT PACED: 79.35 %
MDC IDC STAT BRADY RV PERCENT PACED: 0.28 %

## 2015-10-02 MED ORDER — METOPROLOL SUCCINATE ER 50 MG PO TB24: 50.0000 mg | ORAL_TABLET | Freq: Every day | ORAL | 3 refills | Status: DC

## 2015-10-02 NOTE — Progress Notes (Signed)
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  Since her recent AF ablation, the patient reports doing very well. She denies procedure related complications.  She has had mild ERAF but overall burden is 0.3%.   Today, she denies symptoms of palpitations, chest pain, shortness of breath,  lower extremity edema, dizziness, presyncope, or syncope.  The patient is otherwise without complaint today.   Past Medical History:  Diagnosis Date  . Arthritis   . Atrial fibrillation ([place])   . Bradycardia    s/p PPM  . Cataract   . Colonic polyp    adenomatous  . Constipation   . Diverticulosis   . Hemorrhoids   . Hypertension   . Hypothyroidism   . Irritable bowel syndrome (IBS)   . Pacemaker 2010   Past Surgical History:  Procedure Laterality Date  . COLONOSCOPY    . ELECTROPHYSIOLOGIC STUDY N/A 07/01/2015   Procedure: Atrial Fibrillation Ablation;  Surgeon: Thompson Grayer, MD;  Location: [place] [facility] LAB;  Service: Cardiovascular;  Laterality: N/A;  . HEMATOMA EVACUATION Right 07/15/2014   Procedure: EVACUATION Right Groin Hematoma;  Surgeon: Rosetta Posner, MD;  Location: [place];  Service: Vascular;  Laterality: Right;  . NASAL ENDOSCOPY WITH EPISTAXIS CONTROL N/A 07/14/2014   Procedure: NASAL ENDOSCOPY WITH EPISTAXIS CONTROL;  Surgeon: Ruby Cola, MD;  Location: [place];  Service: ENT;  Laterality: N/A;  . NASAL ENDOSCOPY WITH EPISTAXIS CONTROL Bilateral 07/15/2014   Procedure: NASAL ENDOSCOPY WITH EPISTAXIS CONTROL;  Surgeon: Ruby Cola, MD;  Location: [place];  Service: ENT;  Laterality: Bilateral;  . PACEMAKER INSERTION  2010   implanted by Dr Doreatha Lew (MDT)  . RADIOLOGY WITH ANESTHESIA N/A 07/14/2014   Procedure: RADIOLOGY WITH ANESTHESIA;  Surgeon: Medication Radiologist, MD;  Location: [place];  Service: Radiology;  Laterality: N/A;  . TEE WITHOUT CARDIOVERSION N/A 07/01/2015   Procedure: TRANSESOPHAGEAL ECHOCARDIOGRAM (TEE);  Surgeon: Sueanne Margarita, MD;  Location: [facility] ENDOSCOPY;  Service: Cardiovascular;  Laterality: N/A;    Current Outpatient Prescriptions  Medication Sig Dispense Refill  . acetaminophen (TYLENOL) 500 MG tablet Take 500-1,000 mg by mouth every 6 (six) hours as needed for moderate pain or headache.    Marland Kitchen amiodarone (PACERONE) 200 MG tablet Take 0.5 tablets (100 mg total) by mouth daily. 45 tablet 2  . Ascorbic Acid (VITAMIN C) 1000 MG tablet Take 1,000 mg by mouth daily.      . Calcium-Vitamin D (CALTRATE 600 PLUS-VIT D PO) Take 1 tablet by mouth daily.    . cholecalciferol (VITAMIN D) 1000 UNITS tablet Take 1,000 Units by mouth daily.      Marland Kitchen diltiazem (CARDIZEM CD) 360 MG 24 hr capsule Take 1 capsule (360 mg total) by mouth daily. 90 capsule 3  . LYSINE PO Take 1-2 tablets by mouth daily as needed (fever blister).    . metoprolol succinate (TOPROL XL) 50 MG 24 hr tablet Take 1 tablet (50mg ) by mouth in the morning and 1/2 tablet (25mg ) in the evening. 45 tablet 6  . Multiple Vitamin (MULTIVITAMIN) capsule Take 1 capsule by mouth daily.      . Multiple Vitamins-Minerals (PRESERVISION AREDS 2 PO) Take 1 capsule by mouth daily.    . mupirocin ointment (BACTROBAN) 2 % Apply 1 application topically 3 (three) times daily.   0  . Omega-3 Fatty Acids (FISH OIL) 1200 MG CAPS Take 1 capsule by  mouth daily. Mega Red.    . polyethylene glycol powder (GLYCOLAX/MIRALAX) powder Take 2 capfuls by mouth daily  0  . Probiotic Product (ALIGN PO) Take 1 capsule by mouth daily.     . Rivaroxaban (XARELTO) 15 MG TABS tablet Take 1 tablet (15 mg total) by mouth daily with supper. 30 tablet 6  . rosuvastatin (CRESTOR) 10 MG tablet Take 1 tablet (10 mg total) by mouth daily. 30 tablet 3  . SYNTHROID 50 MCG tablet as directed.  7  . SYNTHROID 75 MCG tablet Take 50-75 mcg by mouth daily before breakfast. Alternates between 50 mcg and 75 mcg daily  2  . temazepam (RESTORIL) 30  MG capsule Take 30 mg by mouth at bedtime.      No current facility-administered medications for this visit.     Physical Exam: Vitals:   10/02/15 1116  BP: 126/72  Pulse: 68  Weight: 125 lb 12.8 oz (57.1 kg)  Height: 5\' 1"  (1.549 m)    GEN- The patient is well appearing, alert and oriented x 3 today.   Head- normocephalic, atraumatic Eyes-  Sclera clear, conjunctiva pink Ears- hearing intact Oropharynx- clear Lungs- Clear to ausculation bilaterally, normal work of breathing Chest- pacemaker pocket is well healed Heart- Regular rate and rhythm, no murmurs, rubs or gallops, PMI not laterally displaced GI- soft, NT, ND, + BS Extremities- no clubbing, cyanosis, or edema  Pacemaker interrogation- reviewed in detail today,  See PACEART report ekg is ordered today for AF and reveals atrial pacing with PR 168 msec, nonspecific ST/T Changes  Assessment and Plan:  1. Sick sinus syndrome Normal pacemaker function See Pace Art report No changes today  2. afib Doing well s/p ablation In the setting of severe LA enlargement and multiple atypical atrial flutter circuits on recent EPS, I have cautioned that she may have some atrial arrhythmias going forward.  I am very pleased that currently her afib burden by PPM interrogation is 0.3% Continue life long anticoagulation Reduce toprol to 50mg  daily.  3. HTN Stable No change required today  Follow-up with Dr Oval Linsey as scheduled I will see again in 5 months   Thompson Grayer MD, [facility] 10/02/2015 11:54 AM

## 2015-10-02 NOTE — Patient Instructions (Signed)
Medication Instructions:  Your physician has recommended you make the following change in your medication:  1) Decrease Metoprolol to 50 mg daily   Labwork: None ordered   Testing/Procedures: None ordered   Follow-Up  Remote monitoring is used to monitor your Pacemaker of from home. This monitoring reduces the number of office visits required to check your device to one time per year. It allows [place] to keep an eye on the functioning of your device to ensure it is working properly. You are scheduled for a device check from home on 01/05/16. You may send your transmission at any time that day. If you have a wireless device, the transmission will be sent automatically. After your physician reviews your transmission, you will receive a postcard with your next transmission date.  Your physician wants you to follow-up in: 5 months with Dr Rayann Heman Dennis Bast will receive a reminder letter in the mail two months in advance. If you don't receive a letter, please call our office to schedule the follow-up appointment.     Any Other Special Instructions Will Be Listed Below (If Applicable).     If you need a refill on your cardiac medications before your next appointment, please call your pharmacy.

## 2015-10-07 DIAGNOSIS — Z961 Presence of intraocular lens: Secondary | ICD-10-CM | POA: Diagnosis not present

## 2015-10-07 DIAGNOSIS — H35371 Puckering of macula, right eye: Secondary | ICD-10-CM | POA: Diagnosis not present

## 2015-10-07 DIAGNOSIS — H16223 Keratoconjunctivitis sicca, not specified as Sjogren's, bilateral: Secondary | ICD-10-CM | POA: Diagnosis not present

## 2015-10-07 DIAGNOSIS — H02423 Myogenic ptosis of bilateral eyelids: Secondary | ICD-10-CM | POA: Diagnosis not present

## 2015-10-08 DIAGNOSIS — I4891 Unspecified atrial fibrillation: Secondary | ICD-10-CM | POA: Diagnosis not present

## 2015-10-08 DIAGNOSIS — Z79899 Other long term (current) drug therapy: Secondary | ICD-10-CM | POA: Diagnosis not present

## 2015-10-08 LAB — HEPATIC FUNCTION PANEL
ALT: 24 U/L (ref 6–29)
AST: 26 U/L (ref 10–35)
Albumin: 4.5 g/dL (ref 3.6–5.1)
Alkaline Phosphatase: 68 U/L (ref 33–130)
BILIRUBIN DIRECT: 0.1 mg/dL (ref <=0.2)
BILIRUBIN TOTAL: 0.6 mg/dL (ref 0.2–1.2)
Indirect Bilirubin: 0.5 mg/dL (ref 0.2–1.2)
TOTAL PROTEIN: 6.9 g/dL (ref 6.1–8.1)

## 2015-10-08 LAB — LIPID PANEL
Cholesterol: 167 mg/dL (ref 125–200)
HDL: 94 mg/dL (ref >=46)
LDL CALC: 62 mg/dL (ref <130)
Total CHOL/HDL Ratio: 1.8 Ratio (ref <=5.0)
Triglycerides: 56 mg/dL (ref <150)
VLDL: 11 mg/dL (ref <30)

## 2015-10-09 DIAGNOSIS — M9903 Segmental and somatic dysfunction of lumbar region: Secondary | ICD-10-CM | POA: Diagnosis not present

## 2015-10-09 DIAGNOSIS — M5116 Intervertebral disc disorders with radiculopathy, lumbar region: Secondary | ICD-10-CM | POA: Diagnosis not present

## 2015-10-21 ENCOUNTER — Telehealth: Payer: Self-pay | Admitting: Cardiovascular Disease

## 2015-10-21 NOTE — Telephone Encounter (Signed)
Returning call,concerning her lab results. °

## 2015-10-21 NOTE — Telephone Encounter (Signed)
Returned call to patient-made aware of lab results.   Notes Recorded by [name], MD on 10/19/2015 at 9:19 PM EDT Normal cholesterol levels and liver function.   Verbalized understanding.

## 2015-10-23 DIAGNOSIS — Z23 Encounter for immunization: Secondary | ICD-10-CM | POA: Diagnosis not present

## 2015-10-31 ENCOUNTER — Telehealth: Payer: Self-pay | Admitting: *Deleted

## 2015-10-31 NOTE — Telephone Encounter (Signed)
Surgical clearance faxed to Dr Katy Fitch 10/17/15  Ok to hold Xarelto prior to procedure

## 2015-11-11 ENCOUNTER — Other Ambulatory Visit: Payer: Self-pay

## 2015-11-11 MED ORDER — DILTIAZEM HCL ER COATED BEADS 360 MG PO CP24: 360.0000 mg | ORAL_CAPSULE | Freq: Every day | ORAL | 3 refills | Status: DC

## 2015-12-03 ENCOUNTER — Ambulatory Visit (INDEPENDENT_AMBULATORY_CARE_PROVIDER_SITE_OTHER): Payer: Medicare Other | Admitting: Cardiovascular Disease

## 2015-12-03 ENCOUNTER — Encounter: Payer: Self-pay | Admitting: Cardiovascular Disease

## 2015-12-03 VITALS — BP 121/57 | HR 69 | Ht 61.0 in | Wt 126.6 lb

## 2015-12-03 DIAGNOSIS — E7800 Pure hypercholesterolemia, unspecified: Secondary | ICD-10-CM

## 2015-12-03 DIAGNOSIS — I480 Paroxysmal atrial fibrillation: Secondary | ICD-10-CM

## 2015-12-03 NOTE — Progress Notes (Signed)
Cardiology Office Note   Date:  12/03/2015   ID:  Kathryn Gibson, DOB 11-Apr-1933, MRN [id number]  PCP:  Tivis Ringer, MD  Cardiologist:   [name], MD  Electrophysiologist: Thompson Grayer  Chief Complaint  Patient presents with  . Follow-up    3 months; Pt states no Sx.      History of Present Illness: Kathryn Gibson is a 82 y.o. female with atrial fibrillation s/p ablation, sss s/p PPM,  hypertension, who presents for follow-up.  Kathryn Gibson  was previously a patient of Dr. Mare Ferrari.  She underwent ablation for atrial fibrillation on 07/01/15.  She last saw Dr. Rayann Heman 09/2015, at which time her atrial fibrillation burdened by 0.3%. She reports not feeling any different than she did prior to the ablation.  Her pre-ablation CT scan revealed coronary calcification. She was started on rosuvastatin 08/2015.  Follow-up lipids and LFTs showed improvement in her LDL to 62 and normal liver function. She has tolerated rosuvastatin without any problems. She continues to exercise 6 days per week. She continues to note some episodes of palpitations. It is significantly improved from before her ablation. She notes occasional lower extremity edema that improves with elevation of her leg. She denies orthopnea or PND.   Past Medical History:  Diagnosis Date  . Arthritis   . Atrial fibrillation ([place])   . Bradycardia    s/p PPM  . Cataract   . Colonic polyp    adenomatous  . Constipation   . Diverticulosis   . Hemorrhoids   . Hypertension   . Hypothyroidism   . Irritable bowel syndrome (IBS)   . Pacemaker 2010    Past Surgical History:  Procedure Laterality Date  . COLONOSCOPY    . ELECTROPHYSIOLOGIC STUDY N/A 07/01/2015   Procedure: Atrial Fibrillation Ablation;  Surgeon: Thompson Grayer, MD;  Location: [place] [facility];  Service: Cardiovascular;  Laterality: N/A;  . HEMATOMA EVACUATION Right 07/15/2014   Procedure: EVACUATION Right Groin Hematoma;  Surgeon: Rosetta Posner, MD;   Location: [place];  Service: Vascular;  Laterality: Right;  . NASAL ENDOSCOPY WITH EPISTAXIS CONTROL N/A 07/14/2014   Procedure: NASAL ENDOSCOPY WITH EPISTAXIS CONTROL;  Surgeon: Ruby Cola, MD;  Location: [place];  Service: ENT;  Laterality: N/A;  . NASAL ENDOSCOPY WITH EPISTAXIS CONTROL Bilateral 07/15/2014   Procedure: NASAL ENDOSCOPY WITH EPISTAXIS CONTROL;  Surgeon: Ruby Cola, MD;  Location: [place];  Service: ENT;  Laterality: Bilateral;  . PACEMAKER INSERTION  2010   implanted by Dr Doreatha Lew (MDT)  . RADIOLOGY WITH ANESTHESIA N/A 07/14/2014   Procedure: RADIOLOGY WITH ANESTHESIA;  Surgeon: Medication Radiologist, MD;  Location: [place];  Service: Radiology;  Laterality: N/A;  . TEE WITHOUT CARDIOVERSION N/A 07/01/2015   Procedure: TRANSESOPHAGEAL ECHOCARDIOGRAM (TEE);  Surgeon: Sueanne Margarita, MD;  Location: [facility];  Service: Cardiovascular;  Laterality: N/A;     Current Outpatient Prescriptions  Medication Sig Dispense Refill  . acetaminophen (TYLENOL) 500 MG tablet Take 500-1,000 mg by mouth every 6 (six) hours as needed for moderate pain or headache.    Marland Kitchen amiodarone (PACERONE) 200 MG tablet Take 0.5 tablets (100 mg total) by mouth daily. 45 tablet 2  . Ascorbic Acid (VITAMIN C) 1000 MG tablet Take 1,000 mg by mouth daily.      . Calcium-Vitamin D (CALTRATE 600 PLUS-VIT D PO) Take 1 tablet by mouth daily.    . cholecalciferol (VITAMIN D) 1000 UNITS tablet Take 1,000 Units by mouth daily.      Marland Kitchen  diltiazem (CARDIZEM CD) 360 MG 24 hr capsule Take 1 capsule (360 mg total) by mouth daily. 90 capsule 3  . LYSINE PO Take 1-2 tablets by mouth daily as needed (fever blister).    . metoprolol succinate (TOPROL XL) 50 MG 24 hr tablet Take 1 tablet (50 mg total) by mouth daily. 90 tablet 3  . Multiple Vitamin (MULTIVITAMIN) capsule Take 1 capsule by mouth daily.      . Multiple Vitamins-Minerals (PRESERVISION AREDS 2 PO) Take 1 capsule by mouth daily.    . mupirocin ointment (BACTROBAN) 2 % Apply  1 application topically 3 (three) times daily.   0  . Omega-3 Fatty Acids (FISH OIL) 1200 MG CAPS Take 1 capsule by mouth daily. Mega Red.    . polyethylene glycol powder (GLYCOLAX/MIRALAX) powder Take 2 capfuls by mouth daily  0  . Probiotic Product (ALIGN PO) Take 1 capsule by mouth daily.     . Rivaroxaban (XARELTO) 15 MG TABS tablet Take 1 tablet (15 mg total) by mouth daily with supper. 30 tablet 6  . rosuvastatin (CRESTOR) 10 MG tablet Take 1 tablet (10 mg total) by mouth daily. 30 tablet 3  . SYNTHROID 50 MCG tablet as directed.  7  . SYNTHROID 75 MCG tablet Take 50-75 mcg by mouth daily before breakfast. Alternates between 50 mcg and 75 mcg daily  2  . temazepam (RESTORIL) 30 MG capsule Take 30 mg by mouth at bedtime.      No current facility-administered medications for this visit.     Allergies:   Review of patient's allergies indicates no known allergies.    Social History:  The patient  reports that she quit smoking about 35 years ago. Her smoking use included Cigarettes. She has never used smokeless tobacco. She reports that she drinks about 8.4 oz of alcohol per week . She reports that she does not use drugs.   Family History:  The patient's family history includes CAD in her brother; Congestive Heart Failure in her mother; Dementia in her mother; Diabetes in her paternal grandfather; Heart attack (age of onset: 74) in her father; Hypertension in her mother; Leukemia in her daughter; Lung cancer in her son.    ROS:  Please see the history of present illness.   Otherwise, review of systems are positive for none.   All other systems are reviewed and negative.    PHYSICAL EXAM: VS:  BP (!) 121/57   Pulse 69   Ht 5\' 1"  (1.549 m)   Wt 57.4 kg (126 lb 9.6 oz)   BMI 23.92 kg/m  , BMI Body mass index is 23.92 kg/m. GENERAL:  Well appearing.  No acute distresss HEENT:  Pupils equal round and reactive, fundi not visualized, oral mucosa unremarkable NECK:  No jugular venous  distention, waveform within normal limits, carotid upstroke brisk and symmetric, no bruits LYMPHATICS:  No cervical adenopathy LUNGS:  Clear to auscultation bilaterally HEART:  RRR.  PMI not displaced or sustained,S1 and S2 within normal limits, no S3, no S4, no clicks, no rubs, no murmurs ABD:  Flat, positive bowel sounds normal in frequency in pitch, no bruits, no rebound, no guarding, no midline pulsatile mass, no hepatomegaly, no splenomegaly EXT:  2 plus pulses throughout, no edema, no cyanosis no clubbing SKIN:  No rashes no nodules NEURO:  Cranial nerves II through XII grossly intact, motor grossly intact throughout PSYCH:  Cognitively intact, oriented to person place and time   EKG:  EKG is not ordered  today.   Echo 06/05/15: Study Conclusions  - Left ventricle: The cavity size was normal. Wall thickness was  normal. Systolic function was normal. The estimated ejection  fraction was in the range of 55% to 60%. Wall motion was normal;  there were no regional wall motion abnormalities. - Aortic valve: Trileaflet; mildly thickened, mildly calcified  leaflets. There was mild regurgitation. - Mitral valve: Calcified annulus. - Left atrium: The atrium was mildly dilated. - Right ventricle: Pacer wire or catheter noted in right ventricle.  Impressions:  - Compared to the prior study, there has been no significant  interval change.   Recent Labs: 07/01/2015: BUN 17; Creatinine, Ser 0.83; Hemoglobin 13.4; Platelets 201; Potassium 3.9; Sodium 138 10/08/2015: ALT 24  02/28/15: TSH 7.24, free T4 130  Lipid Panel    Component Value Date/Time   CHOL 167 10/08/2015 0848   TRIG 56 10/08/2015 0848   HDL 94 10/08/2015 0848   CHOLHDL 1.8 10/08/2015 0848   VLDL 11 10/08/2015 0848   LDLCALC 62 10/08/2015 0848      Wt Readings from Last 3 Encounters:  12/03/15 57.4 kg (126 lb 9.6 oz)  10/02/15 57.1 kg (125 lb 12.8 oz)  08/27/15 56.8 kg (125 lb 3.2 oz)     ASSESSMENT AND  PLAN:  # Atrial fibrillation: Kathryn Gibson is s/p ablation and has minimal AF burden on her device interrogation.  She continues to note some palpitations but her device interrogation showed 0.3% atrial fibrillation burden. We will defer management to Dr. Rayann Heman and his team.  Continue amiodarone, diltiazem, metoprolol and Xarelto.  This patients CHA2DS2-VASc Score and unadjusted Ischemic Stroke Rate (% per year) is equal to 4.8 % stroke rate/year from a score of 4 Above score calculated as 1 point each if present [CHF, HTN, DM, Vascular=MI/PAD/Aortic Plaque, Age if 65-74, or Female] Above score calculated as 2 points each if present [Age > 75, or Stroke/TIA/TE]  # Hyperlipidemia: ASCVD 10 year risk is 34%.  LDL 62 09/2015.  # Asymptomatic coronary calcification: Kathryn Gibson had a chest CT pre-ablation that noted coronary calcification.  Her coronary calcium score was 188. No aspirin given that she is on Xarelto.  Continue rosuvastatin.   Current medicines are reviewed at length with the patient today.  The patient does not have concerns regarding medicines.  The following changes have been made:  None Labs/ tests ordered today include:   No orders of the defined types were placed in this encounter.   Disposition:   FU with Kathryn Alsteen C. Oval Linsey, MD, [facility] in 4 months     This note was written with the assistance of speech recognition software.  Please excuse any transcriptional errors.  Signed, Darby Shadwick C. Oval Linsey, MD, [facility]  12/03/2015 1:57 PM    [facility] thinks the myasthenia antibody regularly. Be called if

## 2015-12-03 NOTE — Patient Instructions (Signed)
Your physician wants you to follow-up in: [place] You will receive a reminder letter in the mail two months in advance. If you don't receive a letter, please call our office to schedule the follow-up appointment.

## 2015-12-08 DIAGNOSIS — H53483 Generalized contraction of visual field, bilateral: Secondary | ICD-10-CM | POA: Diagnosis not present

## 2015-12-08 DIAGNOSIS — H02831 Dermatochalasis of right upper eyelid: Secondary | ICD-10-CM | POA: Diagnosis not present

## 2015-12-08 DIAGNOSIS — H02834 Dermatochalasis of left upper eyelid: Secondary | ICD-10-CM | POA: Diagnosis not present

## 2015-12-08 DIAGNOSIS — H02423 Myogenic ptosis of bilateral eyelids: Secondary | ICD-10-CM | POA: Diagnosis not present

## 2015-12-10 DIAGNOSIS — D1801 Hemangioma of skin and subcutaneous tissue: Secondary | ICD-10-CM | POA: Diagnosis not present

## 2015-12-10 DIAGNOSIS — L821 Other seborrheic keratosis: Secondary | ICD-10-CM | POA: Diagnosis not present

## 2015-12-22 DIAGNOSIS — Z1389 Encounter for screening for other disorder: Secondary | ICD-10-CM | POA: Diagnosis not present

## 2015-12-22 DIAGNOSIS — Z01419 Encounter for gynecological examination (general) (routine) without abnormal findings: Secondary | ICD-10-CM | POA: Diagnosis not present

## 2015-12-22 DIAGNOSIS — Z130 Encounter for screening for diseases of the blood and blood-forming organs and certain disorders involving the immune mechanism: Secondary | ICD-10-CM | POA: Diagnosis not present

## 2015-12-28 ENCOUNTER — Other Ambulatory Visit: Payer: Self-pay | Admitting: Cardiovascular Disease

## 2015-12-29 NOTE — Telephone Encounter (Signed)
Please review for refill. Thanks!  

## 2016-01-05 ENCOUNTER — Ambulatory Visit (INDEPENDENT_AMBULATORY_CARE_PROVIDER_SITE_OTHER): Payer: Medicare Other | Admitting: *Deleted

## 2016-01-05 ENCOUNTER — Telehealth: Payer: Self-pay | Admitting: Cardiology

## 2016-01-05 DIAGNOSIS — I495 Sick sinus syndrome: Secondary | ICD-10-CM | POA: Diagnosis not present

## 2016-01-05 NOTE — Telephone Encounter (Signed)
LMOVM reminding pt to send remote transmission.   

## 2016-01-06 NOTE — Progress Notes (Signed)
Remote pacemaker transmission.   

## 2016-01-08 ENCOUNTER — Encounter: Payer: Self-pay | Admitting: Cardiology

## 2016-01-20 ENCOUNTER — Telehealth: Payer: Self-pay | Admitting: Cardiovascular Disease

## 2016-01-20 NOTE — Telephone Encounter (Signed)
New message  Dr. Lorina Rabon, upper eyelid surgery on Monday  Will be sending clearance form to Dr. Blenda Mounts office  Pt needs to stop blood thinner  Please sign and return by Friday, 12/15

## 2016-01-21 NOTE — Telephone Encounter (Signed)
Form received and ok cleared from cardiac standpoint for bilateral upper eyelid ptosis repair Faxed to [phone] Left message to call if do not receive

## 2016-01-22 ENCOUNTER — Telehealth: Payer: Self-pay | Admitting: Cardiovascular Disease

## 2016-01-22 NOTE — Telephone Encounter (Signed)
Faxed to surgical center as requested, advised Morey Hummingbird

## 2016-01-22 NOTE — Telephone Encounter (Signed)
New message      Request for surgical clearance:  1. What type of surgery is being performed?   Eye surgery---upper eyelid repair---104min minor surgery  2. When is this surgery scheduled?   01-26-16  3. Are there any medications that need to be held prior to surgery and how long? Hold xarelto prior to procedure  4. Name of physician performing surgery? Dr Alejandro Mulling 5. What is your office phone and fax number?  Fax machine is not working.  Please email clearance to [email or web address].  If you cannot email, please fax to surgery center fax [phone] and call carrie to let her know it is at the surgical ctr

## 2016-01-23 ENCOUNTER — Encounter: Payer: Self-pay | Admitting: *Deleted

## 2016-01-23 LAB — CUP PACEART REMOTE DEVICE CHECK
Brady Statistic AP VP Percent: 0.01 %
Brady Statistic AP VS Percent: 86.08 %
Brady Statistic AS VS Percent: 13.9 %
Brady Statistic RV Percent Paced: 0.02 %
Implantable Lead Implant Date: 20101109
Implantable Lead Model: 4469
Implantable Lead Model: 4470
Lead Channel Impedance Value: 480 Ohm
Lead Channel Sensing Intrinsic Amplitude: 0.884 mV
Lead Channel Setting Pacing Amplitude: 2 V
Lead Channel Setting Pacing Pulse Width: 0.4 ms
MDC IDC LEAD IMPLANT DT: 20101109
MDC IDC LEAD LOCATION: 753859
MDC IDC LEAD LOCATION: 753860
MDC IDC LEAD SERIAL: 535369
MDC IDC LEAD SERIAL: 657967
MDC IDC MSMT BATTERY VOLTAGE: 2.96 V
MDC IDC MSMT LEADCHNL RV IMPEDANCE VALUE: 424 Ohm
MDC IDC MSMT LEADCHNL RV SENSING INTR AMPL: 10.376 mV
MDC IDC PG IMPLANT DT: 20101109
MDC IDC SESS DTM: 20171127213042
MDC IDC SET LEADCHNL RV PACING AMPLITUDE: 2.5 V
MDC IDC SET LEADCHNL RV SENSING SENSITIVITY: 0.9 mV
MDC IDC STAT BRADY AS VP PERCENT: 0.01 %
MDC IDC STAT BRADY RA PERCENT PACED: 86.09 %

## 2016-01-23 NOTE — Telephone Encounter (Signed)
Clearance signed and faxed Per Dr Oval Linsey hold Xarelto 3 days prior and resume 2 days after

## 2016-01-23 NOTE — Telephone Encounter (Signed)
This encounter was created in error - please disregard.

## 2016-01-26 DIAGNOSIS — H02413 Mechanical ptosis of bilateral eyelids: Secondary | ICD-10-CM | POA: Diagnosis not present

## 2016-01-26 DIAGNOSIS — H02831 Dermatochalasis of right upper eyelid: Secondary | ICD-10-CM | POA: Diagnosis not present

## 2016-01-26 DIAGNOSIS — H02834 Dermatochalasis of left upper eyelid: Secondary | ICD-10-CM | POA: Diagnosis not present

## 2016-01-26 DIAGNOSIS — H02419 Mechanical ptosis of unspecified eyelid: Secondary | ICD-10-CM | POA: Diagnosis not present

## 2016-02-11 DIAGNOSIS — M5116 Intervertebral disc disorders with radiculopathy, lumbar region: Secondary | ICD-10-CM | POA: Diagnosis not present

## 2016-02-11 DIAGNOSIS — M9903 Segmental and somatic dysfunction of lumbar region: Secondary | ICD-10-CM | POA: Diagnosis not present

## 2016-02-24 DIAGNOSIS — M5116 Intervertebral disc disorders with radiculopathy, lumbar region: Secondary | ICD-10-CM | POA: Diagnosis not present

## 2016-02-24 DIAGNOSIS — M9903 Segmental and somatic dysfunction of lumbar region: Secondary | ICD-10-CM | POA: Diagnosis not present

## 2016-02-26 ENCOUNTER — Other Ambulatory Visit: Payer: Self-pay | Admitting: Cardiovascular Disease

## 2016-03-01 ENCOUNTER — Encounter: Payer: Self-pay | Admitting: Internal Medicine

## 2016-03-01 ENCOUNTER — Ambulatory Visit (INDEPENDENT_AMBULATORY_CARE_PROVIDER_SITE_OTHER): Payer: Medicare Other | Admitting: Internal Medicine

## 2016-03-01 VITALS — BP 100/58 | HR 64 | Ht 61.0 in | Wt 127.0 lb

## 2016-03-01 DIAGNOSIS — I495 Sick sinus syndrome: Secondary | ICD-10-CM | POA: Diagnosis not present

## 2016-03-01 DIAGNOSIS — I480 Paroxysmal atrial fibrillation: Secondary | ICD-10-CM | POA: Diagnosis not present

## 2016-03-01 LAB — CUP PACEART INCLINIC DEVICE CHECK
Brady Statistic AP VS Percent: 89.22 %
Brady Statistic AS VS Percent: 10.76 %
Date Time Interrogation Session: 20180122165618
Implantable Lead Implant Date: 20101109
Implantable Lead Implant Date: 20101109
Implantable Lead Location: 753859
Implantable Lead Serial Number: 535369
Lead Channel Impedance Value: 432 Ohm
Lead Channel Pacing Threshold Pulse Width: 0.4 ms
Lead Channel Sensing Intrinsic Amplitude: 9.7 mV
Lead Channel Setting Pacing Amplitude: 2 V
Lead Channel Setting Sensing Sensitivity: 0.9 mV
MDC IDC LEAD LOCATION: 753860
MDC IDC LEAD SERIAL: 657967
MDC IDC MSMT BATTERY VOLTAGE: 2.95 V
MDC IDC MSMT LEADCHNL RA IMPEDANCE VALUE: 488 Ohm
MDC IDC MSMT LEADCHNL RA PACING THRESHOLD AMPLITUDE: 1 V
MDC IDC MSMT LEADCHNL RA PACING THRESHOLD PULSEWIDTH: 0.6 ms
MDC IDC MSMT LEADCHNL RV PACING THRESHOLD AMPLITUDE: 1 V
MDC IDC PG IMPLANT DT: 20101109
MDC IDC SET LEADCHNL RV PACING AMPLITUDE: 2.5 V
MDC IDC SET LEADCHNL RV PACING PULSEWIDTH: 0.4 ms
MDC IDC STAT BRADY AP VP PERCENT: 0.01 %
MDC IDC STAT BRADY AS VP PERCENT: 0.01 %
MDC IDC STAT BRADY RA PERCENT PACED: 89.2 %
MDC IDC STAT BRADY RV PERCENT PACED: 0.02 %

## 2016-03-01 NOTE — Progress Notes (Signed)
PCP: Tivis Ringer, MD Primary Cardiologist:  Dr Oval Linsey (previously Dr Mare Ferrari)  Kathryn Gibson is a 83 y.o. female who presents today for routine electrophysiology followup.  Since her last visit, the patient reports doing very well. She had afib 12/16/15 while at [place] with her daughter.  She has done very well otherwise without any other episodes of afib.  She is very active.  She exercises frequently.  Today, she denies symptoms of palpitations, chest pain, shortness of breath,  lower extremity edema, dizziness, presyncope, or syncope.  The patient is otherwise without complaint today.   Past Medical History:  Diagnosis Date  . Arthritis   . Atrial fibrillation ([place])   . Bradycardia    s/p PPM  . Cataract   . Colonic polyp    adenomatous  . Constipation   . Diverticulosis   . Hemorrhoids   . Hypertension   . Hypothyroidism   . Irritable bowel syndrome (IBS)   . Pacemaker 2010   Past Surgical History:  Procedure Laterality Date  . COLONOSCOPY    . ELECTROPHYSIOLOGIC STUDY N/A 07/01/2015   Procedure: Atrial Fibrillation Ablation;  Surgeon: Thompson Grayer, MD;  Location: [place] [facility];  Service: Cardiovascular;  Laterality: N/A;  . HEMATOMA EVACUATION Right 07/15/2014   Procedure: EVACUATION Right Groin Hematoma;  Surgeon: Rosetta Posner, MD;  Location: [place];  Service: Vascular;  Laterality: Right;  . NASAL ENDOSCOPY WITH EPISTAXIS CONTROL N/A 07/14/2014   Procedure: NASAL ENDOSCOPY WITH EPISTAXIS CONTROL;  Surgeon: Ruby Cola, MD;  Location: [place];  Service: ENT;  Laterality: N/A;  . NASAL ENDOSCOPY WITH EPISTAXIS CONTROL Bilateral 07/15/2014   Procedure: NASAL ENDOSCOPY WITH EPISTAXIS CONTROL;  Surgeon: Ruby Cola, MD;  Location: [place];  Service: ENT;  Laterality: Bilateral;  . PACEMAKER INSERTION  2010   implanted by Dr Doreatha Lew (MDT)  . RADIOLOGY WITH ANESTHESIA N/A 07/14/2014   Procedure: RADIOLOGY WITH ANESTHESIA;  Surgeon: Medication Radiologist, MD;   Location: [place];  Service: Radiology;  Laterality: N/A;  . TEE WITHOUT CARDIOVERSION N/A 07/01/2015   Procedure: TRANSESOPHAGEAL ECHOCARDIOGRAM (TEE);  Surgeon: Sueanne Margarita, MD;  Location: [facility] END[facility];  Service: Cardiovascular;  Laterality: N/A;    Current Outpatient Prescriptions  Medication Sig Dispense Refill  . acetaminophen (TYLENOL) 500 MG tablet Take 500-1,000 mg by mouth every 6 (six) hours as needed for moderate pain or headache.    Marland Kitchen amiodarone (PACERONE) 200 MG tablet TAKE ONE-HALF TABLET BY MOUTH ONCE DAILY 45 tablet 0  . Ascorbic Acid (VITAMIN C) 1000 MG tablet Take 1,000 mg by mouth daily.      . Calcium-Vitamin D (CALTRATE 600 PLUS-VIT D PO) Take 1 tablet by mouth daily.    . cholecalciferol (VITAMIN D) 1000 UNITS tablet Take 1,000 Units by mouth daily.      Marland Kitchen diltiazem (CARDIZEM CD) 360 MG 24 hr capsule Take 1 capsule (360 mg total) by mouth daily. 90 capsule 3  . LYSINE PO Take 1-2 tablets by mouth daily as needed (fever blister).    . metoprolol succinate (TOPROL XL) 50 MG 24 hr tablet Take 1 tablet (50 mg total) by mouth daily. 90 tablet 3  . Multiple Vitamin (MULTIVITAMIN) capsule Take 1 capsule by mouth daily.      . Multiple Vitamins-Minerals (ICAPS AREDS 2 PO) Take by mouth daily.    . mupirocin ointment (BACTROBAN) 2 % Apply 1 application topically 3 (three) times daily.   0  . Omega-3 Fatty Acids (FISH OIL)  1200 MG CAPS Take 1 capsule by mouth daily. Mega Red.    . polyethylene glycol powder (GLYCOLAX/MIRALAX) powder Take 2 capfuls by mouth daily  0  . Probiotic Product (ALIGN PO) Take 1 capsule by mouth daily.     . Rivaroxaban (XARELTO) 15 MG TABS tablet Take 1 tablet (15 mg total) by mouth daily with supper. 30 tablet 6  . rosuvastatin (CRESTOR) 10 MG tablet TAKE ONE TABLET BY MOUTH ONCE DAILY 30 tablet 3  . SYNTHROID 50 MCG tablet as directed.  7  . SYNTHROID 75 MCG tablet Take 50-75 mcg by mouth daily before breakfast. Alternates between 50 mcg and 75 mcg  daily  2  . temazepam (RESTORIL) 30 MG capsule Take 30 mg by mouth at bedtime.      No current facility-administered medications for this visit.     Physical Exam: Vitals:   03/01/16 1411  BP: (!) 100/58  BP Location: Left Arm  Patient Position: Sitting  Cuff Size: Normal  Pulse: 64  Weight: 127 lb (57.6 kg)  Height: 5\' 1"  (1.549 m)    GEN- The patient is well appearing, alert and oriented x 3 today.   Head- normocephalic, atraumatic Eyes-  Sclera clear, conjunctiva pink Ears- hearing intact Oropharynx- clear Lungs- Clear to ausculation bilaterally, normal work of breathing Chest- pacemaker pocket is well healed Heart- Regular rate and rhythm, no murmurs, rubs or gallops, PMI not laterally displaced GI- soft, NT, ND, + BS Extremities- no clubbing, cyanosis, or edema  Pacemaker interrogation- reviewed in detail today,  See PACEART report ekg is ordered today for AF and reveals atrial pacing 61 bpm with PR 200 msec, nonspecific ST/T Changes  Assessment and Plan:  1. Sick sinus syndrome Normal pacemaker function See Pace Art report No changes today  2. afib Doing well s/p ablation on amiodarone 100mg  daily She wishes to have labs followed by PCP Doing well with xarelto  3. HTN Stable No change required today  carelink Follow-up with Dr Oval Linsey as scheduled I will see again in 66 months   Thompson Grayer MD, [facility] 03/01/2016 2:28 PM

## 2016-03-01 NOTE — Patient Instructions (Signed)
Medication Instructions:  Your physician recommends that you continue on your current medications as directed. Please refer to the Current Medication list given to you today.   Labwork: None ordered   Testing/Procedures: None ordered   Follow-Up: Remote monitoring is used to monitor your Pacemaker from home. This monitoring reduces the number of office visits required to check your device to one time per year. It allows [place] to keep an eye on the functioning of your device to ensure it is working properly. You are scheduled for a device check from home on 05/31/16. You may send your transmission at any time that day. If you have a wireless device, the transmission will be sent automatically. After your physician reviews your transmission, you will receive a postcard with your next transmission date.  Your physician wants you to follow-up in: 12 months with Dr Vallery Ridge will receive a reminder letter in the mail two months in advance. If you don't receive a letter, please call our office to schedule the follow-up appointment.     Any Other Special Instructions Will Be Listed Below (If Applicable).     If you need a refill on your cardiac medications before your next appointment, please call your pharmacy.

## 2016-03-04 DIAGNOSIS — M859 Disorder of bone density and structure, unspecified: Secondary | ICD-10-CM | POA: Diagnosis not present

## 2016-03-04 DIAGNOSIS — E038 Other specified hypothyroidism: Secondary | ICD-10-CM | POA: Diagnosis not present

## 2016-03-04 DIAGNOSIS — I10 Essential (primary) hypertension: Secondary | ICD-10-CM | POA: Diagnosis not present

## 2016-03-04 DIAGNOSIS — R8299 Other abnormal findings in urine: Secondary | ICD-10-CM | POA: Diagnosis not present

## 2016-03-11 ENCOUNTER — Other Ambulatory Visit: Payer: Self-pay | Admitting: Cardiovascular Disease

## 2016-03-11 DIAGNOSIS — Z0000 Encounter for general adult medical examination without abnormal findings: Secondary | ICD-10-CM | POA: Diagnosis not present

## 2016-03-11 DIAGNOSIS — I480 Paroxysmal atrial fibrillation: Secondary | ICD-10-CM | POA: Diagnosis not present

## 2016-03-11 DIAGNOSIS — Z950 Presence of cardiac pacemaker: Secondary | ICD-10-CM | POA: Diagnosis not present

## 2016-03-11 DIAGNOSIS — I10 Essential (primary) hypertension: Secondary | ICD-10-CM | POA: Diagnosis not present

## 2016-03-11 NOTE — Telephone Encounter (Signed)
Refill Request.  

## 2016-04-01 ENCOUNTER — Telehealth (HOSPITAL_COMMUNITY): Payer: Self-pay | Admitting: *Deleted

## 2016-04-01 DIAGNOSIS — M9903 Segmental and somatic dysfunction of lumbar region: Secondary | ICD-10-CM | POA: Diagnosis not present

## 2016-04-01 DIAGNOSIS — M5116 Intervertebral disc disorders with radiculopathy, lumbar region: Secondary | ICD-10-CM | POA: Diagnosis not present

## 2016-04-01 NOTE — Telephone Encounter (Signed)
Pt called in stating she feels to be atrial fibrillation with HR in the 90 range but it feels different that previous episodes of afib. Instructed patient to send transmission for [place] to see what rhythm she is in. Pt will try after she returns from an appt she has.

## 2016-04-02 ENCOUNTER — Inpatient Hospital Stay (HOSPITAL_COMMUNITY): Admission: RE | Admit: 2016-04-02 | Payer: Medicare Other | Source: Ambulatory Visit | Admitting: Nurse Practitioner

## 2016-04-02 NOTE — Telephone Encounter (Signed)
Patient was able to send transmission which revealed sinus rhythm but the rate was increased from her normal of 60s to 90s. Discussed with Roderic Palau NP -- will increase metoprolol to 50mg  in the morning and 25mg  in the evening. Will reck on Tuesday per patient request.

## 2016-04-05 DIAGNOSIS — Z09 Encounter for follow-up examination after completed treatment for conditions other than malignant neoplasm: Secondary | ICD-10-CM | POA: Diagnosis not present

## 2016-04-05 DIAGNOSIS — H02409 Unspecified ptosis of unspecified eyelid: Secondary | ICD-10-CM | POA: Insufficient documentation

## 2016-04-05 DIAGNOSIS — H02421 Myogenic ptosis of right eyelid: Secondary | ICD-10-CM | POA: Diagnosis not present

## 2016-04-05 DIAGNOSIS — H53481 Generalized contraction of visual field, right eye: Secondary | ICD-10-CM | POA: Diagnosis not present

## 2016-04-06 ENCOUNTER — Ambulatory Visit (HOSPITAL_COMMUNITY)
Admission: RE | Admit: 2016-04-06 | Discharge: 2016-04-06 | Disposition: A | Discharge status: Home / Self Care | Payer: Medicare Other | Source: Ambulatory Visit | Attending: Nurse Practitioner | Admitting: Nurse Practitioner

## 2016-04-06 ENCOUNTER — Encounter (HOSPITAL_COMMUNITY): Payer: Self-pay | Admitting: Nurse Practitioner

## 2016-04-06 VITALS — BP 110/62 | HR 61 | Ht 61.0 in | Wt 126.0 lb

## 2016-04-06 DIAGNOSIS — R002 Palpitations: Secondary | ICD-10-CM

## 2016-04-06 DIAGNOSIS — Z806 Family history of leukemia: Secondary | ICD-10-CM | POA: Insufficient documentation

## 2016-04-06 DIAGNOSIS — Z801 Family history of malignant neoplasm of trachea, bronchus and lung: Secondary | ICD-10-CM | POA: Diagnosis not present

## 2016-04-06 DIAGNOSIS — E039 Hypothyroidism, unspecified: Secondary | ICD-10-CM | POA: Diagnosis not present

## 2016-04-06 DIAGNOSIS — I4891 Unspecified atrial fibrillation: Secondary | ICD-10-CM | POA: Diagnosis not present

## 2016-04-06 DIAGNOSIS — I10 Essential (primary) hypertension: Secondary | ICD-10-CM | POA: Insufficient documentation

## 2016-04-06 DIAGNOSIS — K589 Irritable bowel syndrome without diarrhea: Secondary | ICD-10-CM | POA: Diagnosis not present

## 2016-04-06 DIAGNOSIS — Z7902 Long term (current) use of antithrombotics/antiplatelets: Secondary | ICD-10-CM | POA: Diagnosis not present

## 2016-04-06 DIAGNOSIS — Z8249 Family history of ischemic heart disease and other diseases of the circulatory system: Secondary | ICD-10-CM | POA: Diagnosis not present

## 2016-04-06 DIAGNOSIS — Z86010 Personal history of colonic polyps: Secondary | ICD-10-CM | POA: Diagnosis not present

## 2016-04-06 DIAGNOSIS — Z7901 Long term (current) use of anticoagulants: Secondary | ICD-10-CM | POA: Diagnosis not present

## 2016-04-06 DIAGNOSIS — Z833 Family history of diabetes mellitus: Secondary | ICD-10-CM | POA: Diagnosis not present

## 2016-04-06 DIAGNOSIS — Z950 Presence of cardiac pacemaker: Secondary | ICD-10-CM | POA: Diagnosis not present

## 2016-04-06 DIAGNOSIS — Z87891 Personal history of nicotine dependence: Secondary | ICD-10-CM | POA: Insufficient documentation

## 2016-04-06 DIAGNOSIS — H269 Unspecified cataract: Secondary | ICD-10-CM | POA: Diagnosis not present

## 2016-04-06 MED ORDER — METOPROLOL SUCCINATE ER 50 MG PO TB24: 50.0000 mg | ORAL_TABLET | Freq: Every day | ORAL | 6 refills | Status: DC

## 2016-04-06 MED ORDER — DILTIAZEM HCL ER COATED BEADS 360 MG PO CP24: 360.0000 mg | ORAL_CAPSULE | Freq: Every day | ORAL | 6 refills | Status: DC

## 2016-04-06 NOTE — Progress Notes (Signed)
Patient ID: Kathryn Gibson, female   DOB: 09-26-1933, 83 y.o.   MRN: [id number]           Primary Care Physician: Tivis Ringer, MD Referring Physician: Dr. Ezzard Flax is a 83 y.o. female with a h/o afib, s/p afib ablation 07/01/15. She asked to be seen for epiosdes of heart racing since procedure. She has returned to her exercise and feels well exercising, never notices heart irregularity at those times. BB was stopped after ablation but she has retuirned to 1/2 tab this past week which has helped but feels that she requires the full 50 mg tab. PPM interrogated and shows episodes of atach and aflutter but no afib. Denies any swallowing difficulties or groin issues from procedure.  Returns 6/26, c/o of "more afib than ever." Device interrogated and last afib she had was 28 mins on 6/20. Other than that, she has some sinus tach up to 100-110 bpm. EKG shows SR today at 100 bpm. She is upset that she has gained 2 lbs over the last month. Thinks PPI is responsible. No swallowing or groin issues.Exercising regularly with no heart rate issues with exercise.  F/u afib clinic 2/27. Pt called to office last week and noted that HR was running higher in the 80's and 90's and she is used to HR running in the 60's. She did a download of her device which was reviewed and it was SR. She was told to increase her BB over the week end and today she is in a paced rhythm around 60. She feels well. The only thing out of her usual was that she had a "mild" flu for the first couple weeks of February.She continues to go to the pool and do yoga.  Today, she denies symptoms of   chest pain, shortness of breath, orthopnea, PND, lower extremity edema, dizziness, presyncope, syncope, or neurologic sequela. The patient is tolerating medications without difficulties and is otherwise without complaint today.   Past Medical History:  Diagnosis Date  . Arthritis   . Atrial fibrillation ([place])   . Bradycardia     s/p PPM  . Cataract   . Colonic polyp    adenomatous  . Constipation   . Diverticulosis   . Hemorrhoids   . Hypertension   . Hypothyroidism   . Irritable bowel syndrome (IBS)   . Pacemaker 2010   Past Surgical History:  Procedure Laterality Date  . COLONOSCOPY    . ELECTROPHYSIOLOGIC STUDY N/A 07/01/2015   Procedure: Atrial Fibrillation Ablation;  Surgeon: Thompson Grayer, MD;  Location: [place] [facility];  Service: Cardiovascular;  Laterality: N/A;  . HEMATOMA EVACUATION Right 07/15/2014   Procedure: EVACUATION Right Groin Hematoma;  Surgeon: Rosetta Posner, MD;  Location: [place];  Service: Vascular;  Laterality: Right;  . NASAL ENDOSCOPY WITH EPISTAXIS CONTROL N/A 07/14/2014   Procedure: NASAL ENDOSCOPY WITH EPISTAXIS CONTROL;  Surgeon: Ruby Cola, MD;  Location: [place];  Service: ENT;  Laterality: N/A;  . NASAL ENDOSCOPY WITH EPISTAXIS CONTROL Bilateral 07/15/2014   Procedure: NASAL ENDOSCOPY WITH EPISTAXIS CONTROL;  Surgeon: Ruby Cola, MD;  Location: [place];  Service: ENT;  Laterality: Bilateral;  . PACEMAKER INSERTION  2010   implanted by Dr Doreatha Lew (MDT)  . RADIOLOGY WITH ANESTHESIA N/A 07/14/2014   Procedure: RADIOLOGY WITH ANESTHESIA;  Surgeon: Medication Radiologist, MD;  Location: [place];  Service: Radiology;  Laterality: N/A;  . TEE WITHOUT CARDIOVERSION N/A 07/01/2015   Procedure: TRANSESOPHAGEAL ECHOCARDIOGRAM (TEE);  Surgeon: Sueanne Margarita, MD;  Location: [facility] END[facility];  Service: Cardiovascular;  Laterality: N/A;    Current Outpatient Prescriptions  Medication Sig Dispense Refill  . acetaminophen (TYLENOL) 500 MG tablet Take 500-1,000 mg by mouth every 6 (six) hours as needed for moderate pain or headache.    Marland Kitchen amiodarone (PACERONE) 200 MG tablet TAKE ONE-HALF TABLET BY MOUTH ONCE DAILY 45 tablet 0  . Ascorbic Acid (VITAMIN C) 1000 MG tablet Take 1,000 mg by mouth daily.      . Calcium-Vitamin D (CALTRATE 600 PLUS-VIT D PO) Take 1 tablet by mouth daily.    . cholecalciferol  (VITAMIN D) 1000 UNITS tablet Take 1,000 Units by mouth daily.      Marland Kitchen diltiazem (CARDIZEM CD) 360 MG 24 hr capsule Take 1 capsule (360 mg total) by mouth daily. 30 capsule 6  . LYSINE PO Take 1-2 tablets by mouth daily as needed (fever blister).    . metoprolol succinate (TOPROL XL) 50 MG 24 hr tablet Take 1 tablet (50 mg total) by mouth daily. 30 tablet 6  . Multiple Vitamin (MULTIVITAMIN) capsule Take 1 capsule by mouth daily.      . Multiple Vitamins-Minerals (ICAPS AREDS 2 PO) Take by mouth daily.    . mupirocin ointment (BACTROBAN) 2 % Apply 1 application topically 3 (three) times daily.   0  . Omega-3 Fatty Acids (FISH OIL) 1200 MG CAPS Take 1 capsule by mouth daily. Mega Red.    . polyethylene glycol powder (GLYCOLAX/MIRALAX) powder Take 2 capfuls by mouth daily  0  . Probiotic Product (ALIGN PO) Take 1 capsule by mouth daily.     . Rivaroxaban (XARELTO) 15 MG TABS tablet Take 1 tablet (15 mg total) by mouth daily with supper. 30 tablet 6  . rosuvastatin (CRESTOR) 10 MG tablet TAKE ONE TABLET BY MOUTH ONCE DAILY 30 tablet 3  . SYNTHROID 50 MCG tablet as directed.  7  . SYNTHROID 75 MCG tablet Take 50-75 mcg by mouth daily before breakfast. Alternates between 50 mcg and 75 mcg daily  2  . temazepam (RESTORIL) 30 MG capsule Take 30 mg by mouth at bedtime.      No current facility-administered medications for this encounter.     No Known Allergies  Social History   Social History  . Marital status: Widowed    Spouse name: N/A  . Number of children: 3  . Years of education: N/A   Occupational History  . retired    Social History Main Topics  . Smoking status: Former Smoker    Types: Cigarettes    Quit date: 02/09/1980  . Smokeless tobacco: Never Used  . Alcohol use 8.4 oz/week    14 Glasses of wine per week  . Drug use: No  . Sexual activity: Not on file   Other Topics Concern  . Not on file   Social History Narrative  . No narrative on file    Family History    Problem Relation Age of Onset  . Hypertension Mother   . Congestive Heart Failure Mother   . Dementia Mother   . Heart attack Father 38    MI cause of death  . Diabetes Paternal Grandfather   . CAD Brother     2 stents  . Lung cancer Son   . Leukemia Daughter   . Colon cancer Neg Hx   . Kidney disease Neg Hx   . Liver disease Neg Hx   . Esophageal cancer Neg Hx   .  Stroke Neg Hx     ROS- All systems are reviewed and negative except as per the HPI above  Physical Exam: Vitals:   04/06/16 1407  BP: 110/62  Pulse: 61  Weight: 126 lb (57.2 kg)  Height: 5\' 1"  (1.549 m)    GEN- The patient is well appearing, alert and oriented x 3 today.   Head- normocephalic, atraumatic Eyes-  Sclera clear, conjunctiva pink Ears- hearing intact Oropharynx- clear Neck- supple, no JVP Lymph- no cervical lymphadenopathy Lungs- Clear to ausculation bilaterally, normal work of breathing Heart-regular rate and rhythm, no murmurs, rubs or gallops, PMI not laterally displaced GI- soft, NT, ND, + BS Extremities- no clubbing, cyanosis, or edema MS- no significant deformity or atrophy Skin- no rash or lesion Psych- euthymic mood, full affect Neuro- strength and sensation are intact  EKG-atrial paced rhythm at 61  bpm, qrs int 92 ms, qtc 457 ms Epic records reviewed PPM interrogated with normal functioning device by Sharee Pimple, MDT rep, one episode of afib x 28 mins on 6/20 otherwise some stach at 90-110 bpm   Assessment and Plan: 1. S/p afib ablation  Recent elevation of HR but SR Continue toprol to 50 mg am and 1/2 tab pm for 5-7 days and then return to 50 mg in am Continue amiodarone at 100 mg qd Continue xarelto at 15 mg qd  F/u as scheduled Dr. Oval Linsey and Dr. Rayann Heman   afib clinic as needed    Geroge Baseman. Eddy Liszewski, [facility] [address] [place], [place] [address] [phone]

## 2016-04-22 DIAGNOSIS — M5116 Intervertebral disc disorders with radiculopathy, lumbar region: Secondary | ICD-10-CM | POA: Diagnosis not present

## 2016-04-22 DIAGNOSIS — M9903 Segmental and somatic dysfunction of lumbar region: Secondary | ICD-10-CM | POA: Diagnosis not present

## 2016-04-27 ENCOUNTER — Other Ambulatory Visit: Payer: Self-pay | Admitting: Cardiovascular Disease

## 2016-04-27 NOTE — Telephone Encounter (Signed)
Please review for refill. Thanks!  

## 2016-04-28 ENCOUNTER — Telehealth (HOSPITAL_COMMUNITY): Payer: Self-pay | Admitting: *Deleted

## 2016-04-28 MED ORDER — METOPROLOL SUCCINATE ER 50 MG PO TB24: 50.0000 mg | ORAL_TABLET | Freq: Two times a day (BID) | ORAL | 3 refills | Status: DC

## 2016-04-28 NOTE — Telephone Encounter (Signed)
Patient called in stating her heart rates are continuing to elevated "for her". HR is normally in the 60s. While in office few weeks ago metoprolol was increased to 50mg  in the morning and 25mg  in the evening - pt states she has not seen any changes in her HR with this. Discussed with Roderic Palau NP -- if pt is asymptomatic with HR in the 80-90 range (LINQ reports NSR) then she would continue metoprolol as ordered. If the elevated HR is bothering her she can increase the metoprolol to 50mg  BID. Pt states she will try BID dosing of 50mg  and see how this makes her feel. She has follow up with Dr. Oval Linsey the end of April.

## 2016-05-17 DIAGNOSIS — H02421 Myogenic ptosis of right eyelid: Secondary | ICD-10-CM | POA: Diagnosis not present

## 2016-05-18 ENCOUNTER — Ambulatory Visit: Payer: Self-pay | Admitting: Pharmacist

## 2016-05-18 DIAGNOSIS — I480 Paroxysmal atrial fibrillation: Secondary | ICD-10-CM

## 2016-05-18 DIAGNOSIS — Z5181 Encounter for therapeutic drug level monitoring: Secondary | ICD-10-CM

## 2016-05-27 ENCOUNTER — Telehealth: Payer: Self-pay | Admitting: Pharmacist

## 2016-05-27 NOTE — Telephone Encounter (Signed)
Patient calling states that she received a document about a visit with the coumadin clinic for 05-18-16 and is confused. Please call to discuss, the note on 05-18-16.Thanks.

## 2016-05-27 NOTE — Telephone Encounter (Signed)
Spoke with pt and explained that encounter was solely to remove from Warfarin follow up list. There is no insurance claim associated with encounter. She states understanding and appreciation.

## 2016-05-31 ENCOUNTER — Ambulatory Visit (INDEPENDENT_AMBULATORY_CARE_PROVIDER_SITE_OTHER): Payer: Medicare Other | Admitting: *Deleted

## 2016-05-31 ENCOUNTER — Telehealth: Payer: Self-pay | Admitting: Cardiology

## 2016-05-31 DIAGNOSIS — I495 Sick sinus syndrome: Secondary | ICD-10-CM | POA: Diagnosis not present

## 2016-05-31 DIAGNOSIS — H02403 Unspecified ptosis of bilateral eyelids: Secondary | ICD-10-CM | POA: Diagnosis not present

## 2016-05-31 DIAGNOSIS — M9903 Segmental and somatic dysfunction of lumbar region: Secondary | ICD-10-CM | POA: Diagnosis not present

## 2016-05-31 DIAGNOSIS — H019 Unspecified inflammation of eyelid: Secondary | ICD-10-CM | POA: Diagnosis not present

## 2016-05-31 DIAGNOSIS — M5116 Intervertebral disc disorders with radiculopathy, lumbar region: Secondary | ICD-10-CM | POA: Diagnosis not present

## 2016-05-31 NOTE — Telephone Encounter (Signed)
Spoke with pt and reminded pt of remote transmission that is due today. Pt verbalized understanding.   

## 2016-05-31 NOTE — Progress Notes (Signed)
Remote pacemaker transmission.   

## 2016-06-03 ENCOUNTER — Encounter: Payer: Self-pay | Admitting: Cardiology

## 2016-06-03 LAB — CUP PACEART REMOTE DEVICE CHECK
Battery Voltage: 2.95 V
Brady Statistic AP VS Percent: 86.07 %
Brady Statistic RA Percent Paced: 86.08 %
Brady Statistic RV Percent Paced: 0.04 %
Date Time Interrogation Session: 20180423173504
Implantable Lead Implant Date: 20101109
Implantable Lead Model: 4469
Implantable Lead Serial Number: 535369
Implantable Lead Serial Number: 657967
Implantable Pulse Generator Implant Date: 20101109
Lead Channel Impedance Value: 472 Ohm
Lead Channel Setting Pacing Amplitude: 2 V
Lead Channel Setting Sensing Sensitivity: 0.9 mV
MDC IDC LEAD IMPLANT DT: 20101109
MDC IDC LEAD LOCATION: 753859
MDC IDC LEAD LOCATION: 753860
MDC IDC MSMT LEADCHNL RA SENSING INTR AMPL: 0.795 mV
MDC IDC MSMT LEADCHNL RV IMPEDANCE VALUE: 424 Ohm
MDC IDC MSMT LEADCHNL RV SENSING INTR AMPL: 9.372 mV
MDC IDC SET LEADCHNL RV PACING AMPLITUDE: 2.5 V
MDC IDC SET LEADCHNL RV PACING PULSEWIDTH: 0.4 ms
MDC IDC STAT BRADY AP VP PERCENT: 0.01 %
MDC IDC STAT BRADY AS VP PERCENT: 0.03 %
MDC IDC STAT BRADY AS VS PERCENT: 13.89 %

## 2016-06-04 ENCOUNTER — Encounter: Payer: Self-pay | Admitting: Cardiovascular Disease

## 2016-06-04 ENCOUNTER — Ambulatory Visit (INDEPENDENT_AMBULATORY_CARE_PROVIDER_SITE_OTHER): Payer: Medicare Other | Admitting: Cardiovascular Disease

## 2016-06-04 VITALS — BP 130/68 | HR 70 | Ht 61.0 in | Wt 126.8 lb

## 2016-06-04 DIAGNOSIS — I10 Essential (primary) hypertension: Secondary | ICD-10-CM

## 2016-06-04 DIAGNOSIS — E7800 Pure hypercholesterolemia, unspecified: Secondary | ICD-10-CM | POA: Diagnosis not present

## 2016-06-04 DIAGNOSIS — I259 Chronic ischemic heart disease, unspecified: Secondary | ICD-10-CM

## 2016-06-04 NOTE — Patient Instructions (Signed)

## 2016-06-04 NOTE — Progress Notes (Signed)
Cardiology Office Note   Date:  06/04/2016   ID:  Kathryn Gibson, DOB 08-28-1933, MRN [id number]  PCP:  Tivis Ringer, MD  Cardiologist:   [name], MD  Electrophysiologist: Thompson Grayer  Chief Complaint  Patient presents with  . Follow-up    pt has no complaints today     History of Present Illness: Kathryn Gibson is a 83 y.o. female with atrial fibrillation s/p ablation, sss s/p PPM,  hypertension, who presents for follow-up.  Kathryn Gibson  was previously a patient of Dr. Mare Ferrari.  She underwent ablation for atrial fibrillation on 07/01/15.  She last had an episode of atrial fibrillation 12/2015.  She is frustrated by persistent palpitations.  Her heart rate is regular but in the 90s.  She has been feeling well and denies chest pain or shortness of breath.  She continues to exercise regularly.  She does aerobics MWF and yoga TuTh.  She also does water aerobics six days per week.  She denies chest pain or shortness of breath.  Her pre-ablation CT scan revealed coronary calcification. She was started on rosuvastatin 08/2015.  Kathryn Gibson notes mild lower extremity edema that improves with elevation.    Past Medical History:  Diagnosis Date  . Arthritis   . Atrial fibrillation ([place])   . Bradycardia    s/p PPM  . Cataract   . Colonic polyp    adenomatous  . Constipation   . Diverticulosis   . Hemorrhoids   . Hypertension   . Hypothyroidism   . Irritable bowel syndrome (IBS)   . Pacemaker 2010    Past Surgical History:  Procedure Laterality Date  . COLONOSCOPY    . ELECTROPHYSIOLOGIC STUDY N/A 07/01/2015   Procedure: Atrial Fibrillation Ablation;  Surgeon: Thompson Grayer, MD;  Location: [place] [facility];  Service: Cardiovascular;  Laterality: N/A;  . HEMATOMA EVACUATION Right 07/15/2014   Procedure: EVACUATION Right Groin Hematoma;  Surgeon: Rosetta Posner, MD;  Location: [place];  Service: Vascular;  Laterality: Right;  . NASAL ENDOSCOPY WITH EPISTAXIS CONTROL N/A  07/14/2014   Procedure: NASAL ENDOSCOPY WITH EPISTAXIS CONTROL;  Surgeon: Ruby Cola, MD;  Location: [place];  Service: ENT;  Laterality: N/A;  . NASAL ENDOSCOPY WITH EPISTAXIS CONTROL Bilateral 07/15/2014   Procedure: NASAL ENDOSCOPY WITH EPISTAXIS CONTROL;  Surgeon: Ruby Cola, MD;  Location: [place];  Service: ENT;  Laterality: Bilateral;  . PACEMAKER INSERTION  2010   implanted by Dr Doreatha Lew (MDT)  . RADIOLOGY WITH ANESTHESIA N/A 07/14/2014   Procedure: RADIOLOGY WITH ANESTHESIA;  Surgeon: Medication Radiologist, MD;  Location: [place];  Service: Radiology;  Laterality: N/A;  . TEE WITHOUT CARDIOVERSION N/A 07/01/2015   Procedure: TRANSESOPHAGEAL ECHOCARDIOGRAM (TEE);  Surgeon: Sueanne Margarita, MD;  Location: [facility] END[facility];  Service: Cardiovascular;  Laterality: N/A;     Current Outpatient Prescriptions  Medication Sig Dispense Refill  . acetaminophen (TYLENOL) 500 MG tablet Take 500-1,000 mg by mouth every 6 (six) hours as needed for moderate pain or headache.    Marland Kitchen amiodarone (PACERONE) 200 MG tablet TAKE ONE-HALF TABLET BY MOUTH ONCE DAILY 45 tablet 0  . Ascorbic Acid (VITAMIN C) 1000 MG tablet Take 1,000 mg by mouth daily.      . Calcium-Vitamin D (CALTRATE 600 PLUS-VIT D PO) Take 1 tablet by mouth daily.    . cholecalciferol (VITAMIN D) 1000 UNITS tablet Take 1,000 Units by mouth daily.      Marland Kitchen diltiazem (CARDIZEM CD) 360 MG 24  hr capsule Take 1 capsule (360 mg total) by mouth daily. 30 capsule 6  . LYSINE PO Take 1-2 tablets by mouth daily as needed (fever blister).    . metoprolol succinate (TOPROL XL) 50 MG 24 hr tablet Take 1 tablet (50 mg total) by mouth 2 (two) times daily. 60 tablet 3  . Multiple Vitamin (MULTIVITAMIN) capsule Take 1 capsule by mouth daily.      . Multiple Vitamins-Minerals (ICAPS AREDS 2 PO) Take by mouth daily.    . mupirocin ointment (BACTROBAN) 2 % Apply 1 application topically 3 (three) times daily.   0  . Omega-3 Fatty Acids (FISH OIL) 1200 MG CAPS Take 1 capsule  by mouth daily. Mega Red.    . polyethylene glycol powder (GLYCOLAX/MIRALAX) powder Take 2 capfuls by mouth daily  0  . Probiotic Product (ALIGN PO) Take 1 capsule by mouth daily.     . Rivaroxaban (XARELTO) 15 MG TABS tablet Take 1 tablet (15 mg total) by mouth daily with supper. 30 tablet 6  . rosuvastatin (CRESTOR) 10 MG tablet TAKE ONE TABLET BY MOUTH ONCE DAILY. 30 tablet 3  . SYNTHROID 50 MCG tablet as directed.  7  . SYNTHROID 75 MCG tablet Take 50-75 mcg by mouth daily before breakfast. Alternates between 50 mcg and 75 mcg daily  2  . temazepam (RESTORIL) 30 MG capsule Take 30 mg by mouth at bedtime.      No current facility-administered medications for this visit.     Allergies:   Patient has no known allergies.    Social History:  The patient  reports that she quit smoking about 36 years ago. Her smoking use included Cigarettes. She has never used smokeless tobacco. She reports that she drinks about 8.4 oz of alcohol per week . She reports that she does not use drugs.   Family History:  The patient's family history includes CAD in her brother; Congestive Heart Failure in her mother; Dementia in her mother; Diabetes in her paternal grandfather; Heart attack (age of onset: 60) in her father; Hypertension in her mother; Leukemia in her daughter; Lung cancer in her son.    ROS:  Please see the history of present illness.   Otherwise, review of systems are positive for none.   All other systems are reviewed and negative.    PHYSICAL EXAM: VS:  BP 130/68   Pulse 70   Ht 5\' 1"  (1.549 m)   Wt 57.5 kg (126 lb 12.8 oz)   BMI 23.96 kg/m  , BMI Body mass index is 23.96 kg/m. GENERAL:  Well appearing.  HEENT:  Pupils equal round and reactive, fundi not visualized, oral mucosa unremarkable NECK:  No jugular venous distention, waveform within normal limits, carotid upstroke brisk and symmetric, no bruits LYMPHATICS:  No cervical adenopathy LUNGS:  Clear to auscultation  bilaterally HEART:  RRR.  PMI not displaced or sustained,S1 and S2 within normal limits, no S3, no S4, no clicks, no rubs, no murmurs ABD:  Flat, positive bowel sounds normal in frequency in pitch, no bruits, no rebound, no guarding, no midline pulsatile mass, no hepatomegaly, no splenomegaly EXT:  2 plus pulses throughout, no edema, no cyanosis no clubbing SKIN:  No rashes no nodules NEURO:  Cranial nerves II through XII grossly intact, motor grossly intact throughout PSYCH:  Cognitively intact, oriented to person place and time   EKG:  EKG is not ordered today.   Echo 06/05/15: Study Conclusions  - Left ventricle: The cavity size  was normal. Wall thickness was  normal. Systolic function was normal. The estimated ejection  fraction was in the range of 55% to 60%. Wall motion was normal;  there were no regional wall motion abnormalities. - Aortic valve: Trileaflet; mildly thickened, mildly calcified  leaflets. There was mild regurgitation. - Mitral valve: Calcified annulus. - Left atrium: The atrium was mildly dilated. - Right ventricle: Pacer wire or catheter noted in right ventricle.  Impressions:  - Compared to the prior study, there has been no significant  interval change.   Recent Labs: 07/01/2015: BUN 17; Creatinine, Ser 0.83; Hemoglobin 13.4; Platelets 201; Potassium 3.9; Sodium 138 10/08/2015: ALT 24  02/28/15: TSH 7.24, free T4 130  04/02/16: Sodium 136, potassium 4.3, BUN 22, creatinine 0.8 WBC 6.91, hemoglobin, 12.9, hematocrit 41.2, platelets 208 Total cholesterol 163, triglycerides 45, HDL 70, LDL 84 TSH 2.12, free T4 1.3  Lipid Panel    Component Value Date/Time   CHOL 167 10/08/2015 0848   TRIG 56 10/08/2015 0848   HDL 94 10/08/2015 0848   CHOLHDL 1.8 10/08/2015 0848   VLDL 11 10/08/2015 0848   LDLCALC 62 10/08/2015 0848      Wt Readings from Last 3 Encounters:  06/04/16 57.5 kg (126 lb 12.8 oz)  04/06/16 57.2 kg (126 lb)  03/01/16 57.6 kg  (127 lb)     ASSESSMENT AND PLAN:  # Atrial fibrillation: Kathryn Gibson had no atrial fibrillation on her remote monitor earlier this week. GFR is >60 and she is taking Xarelto 15 mg daily.  Will discuss with Dr. Rayann Heman.  Continue amiodarone . Kathryn Gibson leads on This patients CHA2DS2-VASc Score and unadjusted Ischemic Stroke Rate (% per year) is equal to 4.8 % stroke rate/year from a score of 4 Above score calculated as 1 point each if present [CHF, HTN, DM, Vascular=MI/PAD/Aortic Plaque, Age if 65-74, or Female] Above score calculated as 2 points each if present [Age > 75, or Stroke/TIA/TE]  # Hyperlipidemia: ASCVD 10 year risk is 34%.  LDL 84 03/2016.  # Asymptomatic coronary calcification: Asymptomatic.  She exercises regularly.  Continue rosuvastatin and Xarelto.    Current medicines are reviewed at length with the patient today.  The patient does not have concerns regarding medicines.  The following changes have been made:  None Labs/ tests ordered today include:   No orders of the defined types were placed in this encounter.   Disposition:   FU with Kathryn Bann C. Oval Linsey, MD, [facility] in 6 months     This note was written with the assistance of speech recognition software.  Please excuse any transcriptional errors.  Signed, Cobi Delph C. Oval Linsey, MD, [facility]  06/04/2016 2:43 PM    [place] [facility] thinks the myasthenia antibody regularly. Be called if

## 2016-06-07 DIAGNOSIS — L821 Other seborrheic keratosis: Secondary | ICD-10-CM | POA: Diagnosis not present

## 2016-06-07 DIAGNOSIS — L309 Dermatitis, unspecified: Secondary | ICD-10-CM | POA: Diagnosis not present

## 2016-06-13 ENCOUNTER — Other Ambulatory Visit (HOSPITAL_COMMUNITY): Payer: Self-pay | Admitting: Nurse Practitioner

## 2016-06-14 ENCOUNTER — Telehealth: Payer: Self-pay | Admitting: *Deleted

## 2016-06-14 NOTE — Telephone Encounter (Signed)
-----   Message from [name], MD sent at 06/08/2016 11:18 AM EDT ----- Please increase Xarelto to 20 mg  Thanks!  ----- Message ----- From: Thompson Grayer, MD Sent: 06/06/2016  12:37 PM To: [name], MD, Sherran Needs, NP  Looks like anticoag clinic started xarelto 15mg  daily initially (06/24/15 note).  Must have been based off of CrCl.  If her current creatinine clearance is >50 and stable, I would favor 20mg  daily.    ----- Message ----- From: [name], MD Sent: 06/04/2016   3:11 PM To: Thompson Grayer, MD, Sherran Needs, NP  Did you want her on Xarelto 15 because of her low AF burden post ablation or age?  Her GFR seems like she should be on 20 mg.  ~Tiffany

## 2016-06-14 NOTE — Telephone Encounter (Signed)
Advised patient. She stated she just picked up a new Rx for the Xarelto 15 mg daily. Discussed with Pharm D and since patient is borderline with her cutoff for 15 mg and 20 mg will see if Dr Oval Linsey ok with patient finishing the 15 mg tablets. Will discuss with Dr Oval Linsey in the morning when she is back in the office

## 2016-06-15 DIAGNOSIS — L309 Dermatitis, unspecified: Secondary | ICD-10-CM | POA: Diagnosis not present

## 2016-06-15 MED ORDER — RIVAROXABAN 20 MG PO TABS: 20.0000 mg | ORAL_TABLET | Freq: Every day | ORAL | 11 refills | Status: DC

## 2016-06-15 NOTE — Telephone Encounter (Signed)
Discussed with Dr Oval Linsey and ok to finish current supply Advised patient, verbalized understanding

## 2016-06-28 DIAGNOSIS — M9903 Segmental and somatic dysfunction of lumbar region: Secondary | ICD-10-CM | POA: Diagnosis not present

## 2016-06-28 DIAGNOSIS — M5116 Intervertebral disc disorders with radiculopathy, lumbar region: Secondary | ICD-10-CM | POA: Diagnosis not present

## 2016-07-16 ENCOUNTER — Other Ambulatory Visit: Payer: Self-pay | Admitting: Obstetrics and Gynecology

## 2016-07-16 DIAGNOSIS — Z1231 Encounter for screening mammogram for malignant neoplasm of breast: Secondary | ICD-10-CM

## 2016-07-26 ENCOUNTER — Other Ambulatory Visit: Payer: Self-pay

## 2016-07-26 MED ORDER — ROSUVASTATIN CALCIUM 10 MG PO TABS: 10.0000 mg | ORAL_TABLET | Freq: Every day | ORAL | 3 refills | Status: DC

## 2016-07-26 NOTE — Telephone Encounter (Signed)
Rx(s) sent to pharmacy electronically.  

## 2016-07-29 DIAGNOSIS — M9903 Segmental and somatic dysfunction of lumbar region: Secondary | ICD-10-CM | POA: Diagnosis not present

## 2016-07-29 DIAGNOSIS — M5116 Intervertebral disc disorders with radiculopathy, lumbar region: Secondary | ICD-10-CM | POA: Diagnosis not present

## 2016-07-30 ENCOUNTER — Ambulatory Visit
Admission: RE | Admit: 2016-07-30 | Discharge: 2016-07-30 | Disposition: A | Discharge status: Home / Self Care | Payer: Medicare Other | Source: Ambulatory Visit | Attending: Obstetrics and Gynecology | Admitting: Obstetrics and Gynecology

## 2016-07-30 DIAGNOSIS — Z1231 Encounter for screening mammogram for malignant neoplasm of breast: Secondary | ICD-10-CM | POA: Diagnosis not present

## 2016-08-30 ENCOUNTER — Ambulatory Visit (INDEPENDENT_AMBULATORY_CARE_PROVIDER_SITE_OTHER): Payer: Medicare Other | Admitting: *Deleted

## 2016-08-30 DIAGNOSIS — I495 Sick sinus syndrome: Secondary | ICD-10-CM

## 2016-08-31 NOTE — Progress Notes (Signed)
Remote pacemaker transmission.   

## 2016-09-02 ENCOUNTER — Encounter: Payer: Self-pay | Admitting: Cardiology

## 2016-09-06 LAB — CUP PACEART REMOTE DEVICE CHECK
Battery Voltage: 2.93 V
Brady Statistic AP VP Percent: 0.05 %
Brady Statistic AP VS Percent: 99.19 %
Brady Statistic AS VS Percent: 0.75 %
Brady Statistic RV Percent Paced: 0.06 %
Implantable Lead Implant Date: 20101109
Implantable Lead Location: 753859
Implantable Lead Model: 4469
Implantable Lead Model: 4470
Implantable Lead Serial Number: 657967
Lead Channel Impedance Value: 432 Ohm
Lead Channel Impedance Value: 448 Ohm
Lead Channel Sensing Intrinsic Amplitude: 0.795 mV
Lead Channel Setting Pacing Amplitude: 2 V
Lead Channel Setting Pacing Pulse Width: 0.4 ms
MDC IDC LEAD IMPLANT DT: 20101109
MDC IDC LEAD LOCATION: 753860
MDC IDC LEAD SERIAL: 535369
MDC IDC MSMT LEADCHNL RV SENSING INTR AMPL: 10.042 mV
MDC IDC PG IMPLANT DT: 20101109
MDC IDC SESS DTM: 20180723153013
MDC IDC SET LEADCHNL RV PACING AMPLITUDE: 2.5 V
MDC IDC SET LEADCHNL RV SENSING SENSITIVITY: 0.9 mV
MDC IDC STAT BRADY AS VP PERCENT: 0.01 %
MDC IDC STAT BRADY RA PERCENT PACED: 99.24 %

## 2016-09-07 DIAGNOSIS — M5116 Intervertebral disc disorders with radiculopathy, lumbar region: Secondary | ICD-10-CM | POA: Diagnosis not present

## 2016-09-07 DIAGNOSIS — M9903 Segmental and somatic dysfunction of lumbar region: Secondary | ICD-10-CM | POA: Diagnosis not present

## 2016-09-14 DIAGNOSIS — I10 Essential (primary) hypertension: Secondary | ICD-10-CM | POA: Diagnosis not present

## 2016-09-14 DIAGNOSIS — I480 Paroxysmal atrial fibrillation: Secondary | ICD-10-CM | POA: Diagnosis not present

## 2016-09-14 DIAGNOSIS — E038 Other specified hypothyroidism: Secondary | ICD-10-CM | POA: Diagnosis not present

## 2016-09-14 DIAGNOSIS — I2510 Atherosclerotic heart disease of native coronary artery without angina pectoris: Secondary | ICD-10-CM | POA: Diagnosis not present

## 2016-09-22 DIAGNOSIS — H35372 Puckering of macula, left eye: Secondary | ICD-10-CM | POA: Diagnosis not present

## 2016-09-22 DIAGNOSIS — Z961 Presence of intraocular lens: Secondary | ICD-10-CM | POA: Diagnosis not present

## 2016-09-30 ENCOUNTER — Other Ambulatory Visit (HOSPITAL_COMMUNITY): Payer: Self-pay | Admitting: Nurse Practitioner

## 2016-10-07 DIAGNOSIS — M9903 Segmental and somatic dysfunction of lumbar region: Secondary | ICD-10-CM | POA: Diagnosis not present

## 2016-10-07 DIAGNOSIS — M5116 Intervertebral disc disorders with radiculopathy, lumbar region: Secondary | ICD-10-CM | POA: Diagnosis not present

## 2016-11-11 DIAGNOSIS — M5116 Intervertebral disc disorders with radiculopathy, lumbar region: Secondary | ICD-10-CM | POA: Diagnosis not present

## 2016-11-11 DIAGNOSIS — M9903 Segmental and somatic dysfunction of lumbar region: Secondary | ICD-10-CM | POA: Diagnosis not present

## 2016-11-12 ENCOUNTER — Ambulatory Visit (INDEPENDENT_AMBULATORY_CARE_PROVIDER_SITE_OTHER): Payer: Medicare Other | Admitting: Gastroenterology

## 2016-11-12 ENCOUNTER — Encounter: Payer: Self-pay | Admitting: Gastroenterology

## 2016-11-12 VITALS — BP 120/60 | HR 70 | Ht 61.0 in | Wt 128.2 lb

## 2016-11-12 DIAGNOSIS — K6289 Other specified diseases of anus and rectum: Secondary | ICD-10-CM | POA: Insufficient documentation

## 2016-11-12 DIAGNOSIS — K5900 Constipation, unspecified: Secondary | ICD-10-CM

## 2016-11-12 MED ORDER — HYDROCORTISONE ACETATE 25 MG RE SUPP: 25.0000 mg | Freq: Every day | RECTAL | 0 refills | Status: DC

## 2016-11-12 NOTE — Progress Notes (Signed)
11/12/2016 Kathryn Gibson [id number] 03-21-1933   HISTORY OF PRESENT ILLNESS:  This is an 83 year old female who is known to Dr. Henrene Pastor for treatment of chronic constipation. She is here today again with the same complaint. Also complains of rectal discomfort, which has also been an ongoing complaint. She is currently taking MiraLAX 2 doses at nighttime. She says that she previously tried linzess 145 g daily and did not seem to notice any more difference with that dose of medication. Her last colonoscopy was in May 2016 at which time she was found to have severe diverticulosis in the left colon.  She tells me that sometimes the MiraLAX does not seem to work and at other times it causes her to have watery stool.   Past Medical History:  Diagnosis Date  . Arthritis   . Atrial fibrillation ([place])   . Bradycardia    s/p PPM  . Cataract   . Colonic polyp    adenomatous  . Constipation   . Diverticulosis   . Hemorrhoids   . Hypertension   . Hypothyroidism   . Irritable bowel syndrome (IBS)   . Pacemaker 2010   Past Surgical History:  Procedure Laterality Date  . BREAST CYST ASPIRATION Left 1980s  . COLONOSCOPY    . ELECTROPHYSIOLOGIC STUDY N/A 07/01/2015   Procedure: Atrial Fibrillation Ablation;  Surgeon: Thompson Grayer, MD;  Location: [place] [facility];  Service: Cardiovascular;  Laterality: N/A;  . EYE SURGERY    . HEMATOMA EVACUATION Right 07/15/2014   Procedure: EVACUATION Right Groin Hematoma;  Surgeon: Rosetta Posner, MD;  Location: [place];  Service: Vascular;  Laterality: Right;  . NASAL ENDOSCOPY WITH EPISTAXIS CONTROL N/A 07/14/2014   Procedure: NASAL ENDOSCOPY WITH EPISTAXIS CONTROL;  Surgeon: Ruby Cola, MD;  Location: [place];  Service: ENT;  Laterality: N/A;  . NASAL ENDOSCOPY WITH EPISTAXIS CONTROL Bilateral 07/15/2014   Procedure: NASAL ENDOSCOPY WITH EPISTAXIS CONTROL;  Surgeon: Ruby Cola, MD;  Location: [place];  Service: ENT;  Laterality: Bilateral;  . PACEMAKER  INSERTION  2010   implanted by Dr Doreatha Lew (MDT)  . RADIOLOGY WITH ANESTHESIA N/A 07/14/2014   Procedure: RADIOLOGY WITH ANESTHESIA;  Surgeon: Medication Radiologist, MD;  Location: [place];  Service: Radiology;  Laterality: N/A;  . TEE WITHOUT CARDIOVERSION N/A 07/01/2015   Procedure: TRANSESOPHAGEAL ECHOCARDIOGRAM (TEE);  Surgeon: Sueanne Margarita, MD;  Location: [facility];  Service: Cardiovascular;  Laterality: N/A;    reports that she quit smoking about 36 years ago. Her smoking use included Cigarettes. She has never used smokeless tobacco. She reports that she drinks about 8.4 oz of alcohol per week . She reports that she does not use drugs. family history includes CAD in her brother; Congestive Heart Failure in her mother; Dementia in her mother; Diabetes in her paternal grandfather; Heart attack (age of onset: 66) in her father; Hypertension in her mother; Leukemia in her daughter; Lung cancer in her son. No Known Allergies    Outpatient Encounter Prescriptions as of 11/12/2016  Medication Sig  . acetaminophen (TYLENOL) 500 MG tablet Take 500-1,000 mg by mouth every 6 (six) hours as needed for moderate pain or headache.  Marland Kitchen amiodarone (PACERONE) 200 MG tablet TAKE ONE-HALF TABLET BY MOUTH ONCE DAILY  . Ascorbic Acid (VITAMIN C) 1000 MG tablet Take 1,000 mg by mouth daily.    . Calcium-Vitamin D (CALTRATE 600 PLUS-VIT D PO) Take 1 tablet by mouth daily.  . cholecalciferol (VITAMIN D) 1000 UNITS tablet  Take 1,000 Units by mouth daily.    Marland Kitchen diltiazem (CARDIZEM CD) 360 MG 24 hr capsule Take 1 capsule (360 mg total) by mouth daily.  Marland Kitchen LYSINE PO Take 1-2 tablets by mouth daily as needed (fever blister).  . metoprolol succinate (TOPROL-XL) 50 MG 24 hr tablet TAKE 1 TABLET BY MOUTH TWICE DAILY  . Multiple Vitamin (MULTIVITAMIN) capsule Take 1 capsule by mouth daily.    . Multiple Vitamins-Minerals (ICAPS AREDS 2 PO) Take by mouth daily.  . Omega-3 Fatty Acids (FISH OIL) 1200 MG CAPS Take 1 capsule by  mouth daily. Mega Red.  . polyethylene glycol powder (GLYCOLAX/MIRALAX) powder Take 2 capfuls by mouth daily  . Probiotic Product (ALIGN PO) Take 1 capsule by mouth daily.   . rivaroxaban (XARELTO) 20 MG TABS tablet Take 1 tablet (20 mg total) by mouth daily with supper.  . rosuvastatin (CRESTOR) 10 MG tablet Take 1 tablet (10 mg total) by mouth daily.  Marland Kitchen SYNTHROID 50 MCG tablet as directed.  Marland Kitchen SYNTHROID 75 MCG tablet Take 50-75 mcg by mouth daily before breakfast. Alternates between 50 mcg and 75 mcg daily  . temazepam (RESTORIL) 30 MG capsule Take 30 mg by mouth at bedtime.   . [DISCONTINUED] mupirocin ointment (BACTROBAN) 2 % Apply 1 application topically 3 (three) times daily.    No facility-administered encounter medications on file as of 11/12/2016.      REVIEW OF SYSTEMS  : All other systems reviewed and negative except where noted in the History of Present Illness.   PHYSICAL EXAM: BP 120/60   Pulse 70   Ht 5\' 1"  (1.549 m)   Wt 128 lb 4 oz (58.2 kg)   BMI 24.23 kg/m  General: Well developed white female in no acute distress Head: Normocephalic and atraumatic Eyes:  Sclerae anicteric, conjunctiva pink. Ears: Normal auditory acuity Lungs: Clear throughout to auscultation; no increased WOB. Heart: Regular rate and rhythm; no M/R/G. Abdomen: Soft, non-distended.  BS present.  Mild lower abdominal TTP. Rectal:  Large external hemorrhoids noted.  No masses felt on DRE.  Light brown stool on exam glove. Musculoskeletal: Symmetrical with no gross deformities  Skin: No lesions on visible extremities Extremities: No edema  Neurological: Alert oriented x 4, grossly non-focal Psychological:  Alert and cooperative. Normal mood and affect  ASSESSMENT AND PLAN: *Chronic constipation:  Takes two doses of MiraLAX at one time in the evenings. Feels like sometimes this is too much, and sometimes not enough. Previously little improvement with linzess 145 mcg daily.  We will give her samples  of Amitiza 8 mcg to take BID with food. We will give her enough samples for 1 week. She will call us in a week with an update. If this does not seem to work, we could always try to increase the dose. She could try to separate her doses of MiraLAX instead of taking them both at one time, could potentially add a fiber supplement such as Benefiber or Citrucel to her MiraLAX to act as a bulking agent. *Rectal pain:  Nothing seen on exam to account for her pain.  This issue has been ongoing as well.  She will try hydrocortisone suppositories at bedtime for 7 days empirically for hemorrhoidal discomfort.   CC:  Prince Solian, MD

## 2016-11-12 NOTE — Patient Instructions (Signed)
If you are age 81 or older, your body mass index should be between 23-30. Your Body mass index is 24.23 kg/m. If this is out of the aforementioned range listed, please consider follow up with your Primary Care Provider.  If you are age 72 or younger, your body mass index should be between 19-25. Your Body mass index is 24.23 kg/m. If this is out of the aformentioned range listed, please consider follow up with your Primary Care Provider.   We have sent the following medications to your pharmacy for you to pick up at your convenience:  Hydrocortisone Suppository  You have been given samples of Amitiza today. Take 1 capsule twice daily for 1 week. Take with food.  Call back in one week with an update. Ask for Katina Degree.   Thank you.

## 2016-11-12 NOTE — Progress Notes (Signed)
Assessment and plans reviewed  

## 2016-11-29 ENCOUNTER — Telehealth: Payer: Self-pay | Admitting: Cardiology

## 2016-11-29 ENCOUNTER — Ambulatory Visit (INDEPENDENT_AMBULATORY_CARE_PROVIDER_SITE_OTHER): Payer: Medicare Other | Admitting: *Deleted

## 2016-11-29 DIAGNOSIS — I495 Sick sinus syndrome: Secondary | ICD-10-CM

## 2016-11-29 NOTE — Telephone Encounter (Signed)
LMOVM reminding pt to send remote transmission.   

## 2016-11-29 NOTE — Progress Notes (Signed)
Remote pacemaker transmission.   

## 2016-12-01 LAB — CUP PACEART REMOTE DEVICE CHECK
Battery Voltage: 2.93 V
Brady Statistic AP VP Percent: 0.14 %
Brady Statistic AS VP Percent: 0 %
Brady Statistic RA Percent Paced: 99.35 %
Brady Statistic RV Percent Paced: 0.14 %
Date Time Interrogation Session: 20181022192155
Implantable Lead Implant Date: 20101109
Implantable Lead Location: 753859
Implantable Lead Location: 753860
Implantable Lead Model: 4469
Implantable Lead Serial Number: 657967
Implantable Pulse Generator Implant Date: 20101109
Lead Channel Setting Pacing Amplitude: 2 V
Lead Channel Setting Pacing Amplitude: 2.5 V
Lead Channel Setting Pacing Pulse Width: 0.4 ms
Lead Channel Setting Sensing Sensitivity: 0.9 mV
MDC IDC LEAD IMPLANT DT: 20101109
MDC IDC LEAD SERIAL: 535369
MDC IDC MSMT LEADCHNL RA IMPEDANCE VALUE: 456 Ohm
MDC IDC MSMT LEADCHNL RA SENSING INTR AMPL: 0.928 mV
MDC IDC MSMT LEADCHNL RV IMPEDANCE VALUE: 448 Ohm
MDC IDC MSMT LEADCHNL RV SENSING INTR AMPL: 10.711 mV
MDC IDC STAT BRADY AP VS PERCENT: 99.21 %
MDC IDC STAT BRADY AS VS PERCENT: 0.65 %

## 2016-12-03 ENCOUNTER — Encounter: Payer: Self-pay | Admitting: Cardiology

## 2016-12-07 ENCOUNTER — Encounter: Payer: Self-pay | Admitting: Cardiovascular Disease

## 2016-12-07 ENCOUNTER — Ambulatory Visit (INDEPENDENT_AMBULATORY_CARE_PROVIDER_SITE_OTHER): Payer: Medicare Other | Admitting: Cardiovascular Disease

## 2016-12-07 VITALS — BP 129/70 | HR 65 | Ht 61.0 in | Wt 127.0 lb

## 2016-12-07 DIAGNOSIS — I480 Paroxysmal atrial fibrillation: Secondary | ICD-10-CM | POA: Diagnosis not present

## 2016-12-07 DIAGNOSIS — I2510 Atherosclerotic heart disease of native coronary artery without angina pectoris: Secondary | ICD-10-CM | POA: Diagnosis not present

## 2016-12-07 DIAGNOSIS — E7800 Pure hypercholesterolemia, unspecified: Secondary | ICD-10-CM

## 2016-12-07 DIAGNOSIS — Z7901 Long term (current) use of anticoagulants: Secondary | ICD-10-CM | POA: Diagnosis not present

## 2016-12-07 NOTE — Progress Notes (Signed)
Cardiology Office Note   Date:  12/07/2016   ID:  [name], DOB [birth year], MRN [id number]  PCP:  Prince Solian, MD  Cardiologist:   [name], MD  Electrophysiologist: Thompson Grayer  Chief Complaint  Patient presents with  . Follow-up    6 months;     History of Present Illness: Kathryn Gibson is a 83 y.o. female with atrial fibrillation s/p ablation, SSS s/p PPM, asymptomatic coronary calcifications, hypertension, who presents for follow-up.  Kathryn Gibson  was previously a patient of Dr. Mare Ferrari.  She underwent ablation for atrial fibrillation on 07/01/15.  She last had an episode of atrial fibrillation 12/2015.  She is frustrated by persistent palpitations.  Since her last appointment she has been doing well.  She continues to exercise 6 days per week for up to 1.5 hour.  She does aerobics, yoga and water aerobics.  She feels well with exercise.  She has no chest pain or shortness of breath with this activity.  At her last appointment she complained of lower extremity edema.  This has improved with compression stockings.  She denies chest pain, shortness of breath, lower extremity edema, orthopnea or PND.  She continues to have varicose veins but they are well-controlled.   Past Medical History:  Diagnosis Date  . Arthritis   . Atrial fibrillation ([place])   . Bradycardia    s/p PPM  . Cataract   . Colonic polyp    adenomatous  . Constipation   . Diverticulosis   . Hemorrhoids   . Hypertension   . Hypothyroidism   . Irritable bowel syndrome (IBS)   . Pacemaker 2010    Past Surgical History:  Procedure Laterality Date  . BREAST CYST ASPIRATION Left 1980s  . COLONOSCOPY    . ELECTROPHYSIOLOGIC STUDY N/A 07/01/2015   Procedure: Atrial Fibrillation Ablation;  Surgeon: Thompson Grayer, MD;  Location: [place] [facility];  Service: Cardiovascular;  Laterality: N/A;  . EYE SURGERY    . HEMATOMA EVACUATION Right 07/15/2014   Procedure: EVACUATION Right Groin  Hematoma;  Surgeon: Rosetta Posner, MD;  Location: [place];  Service: Vascular;  Laterality: Right;  . NASAL ENDOSCOPY WITH EPISTAXIS CONTROL N/A 07/14/2014   Procedure: NASAL ENDOSCOPY WITH EPISTAXIS CONTROL;  Surgeon: Ruby Cola, MD;  Location: [place];  Service: ENT;  Laterality: N/A;  . NASAL ENDOSCOPY WITH EPISTAXIS CONTROL Bilateral 07/15/2014   Procedure: NASAL ENDOSCOPY WITH EPISTAXIS CONTROL;  Surgeon: Ruby Cola, MD;  Location: [place];  Service: ENT;  Laterality: Bilateral;  . PACEMAKER INSERTION  2010   implanted by Dr Doreatha Lew (MDT)  . RADIOLOGY WITH ANESTHESIA N/A 07/14/2014   Procedure: RADIOLOGY WITH ANESTHESIA;  Surgeon: Medication Radiologist, MD;  Location: [place];  Service: Radiology;  Laterality: N/A;  . TEE WITHOUT CARDIOVERSION N/A 07/01/2015   Procedure: TRANSESOPHAGEAL ECHOCARDIOGRAM (TEE);  Surgeon: Sueanne Margarita, MD;  Location: [facility] ENDOSCOPY;  Service: Cardiovascular;  Laterality: N/A;     Current Outpatient Prescriptions  Medication Sig Dispense Refill  . acetaminophen (TYLENOL) 500 MG tablet Take 500-1,000 mg by mouth every 6 (six) hours as needed for moderate pain or headache.    Marland Kitchen amiodarone (PACERONE) 200 MG tablet TAKE ONE-HALF TABLET BY MOUTH ONCE DAILY 45 tablet 0  . Ascorbic Acid (VITAMIN C) 1000 MG tablet Take 1,000 mg by mouth daily.      . Calcium-Vitamin D (CALTRATE 600 PLUS-VIT D PO) Take 1 tablet by mouth daily.    . cholecalciferol (VITAMIN  D) 1000 UNITS tablet Take 1,000 Units by mouth daily.      Marland Kitchen diltiazem (CARDIZEM CD) 360 MG 24 hr capsule Take 1 capsule (360 mg total) by mouth daily. 30 capsule 6  . hydrocortisone (ANUSOL-HC) 25 MG suppository Place 1 suppository (25 mg total) rectally at bedtime. QHSfor 7 weeks 7 suppository 0  . LYSINE PO Take 1-2 tablets by mouth daily as needed (fever blister).    . metoprolol succinate (TOPROL-XL) 50 MG 24 hr tablet TAKE 1 TABLET BY MOUTH TWICE DAILY 60 tablet 3  . Multiple Vitamin (MULTIVITAMIN) capsule Take 1  capsule by mouth daily.      . Multiple Vitamins-Minerals (ICAPS AREDS 2 PO) Take by mouth daily.    . Omega-3 Fatty Acids (FISH OIL) 1200 MG CAPS Take 1 capsule by mouth daily. Mega Red.    . polyethylene glycol powder (GLYCOLAX/MIRALAX) powder Take 2 capfuls by mouth daily  0  . Probiotic Product (ALIGN PO) Take 1 capsule by mouth daily.     . rivaroxaban (XARELTO) 20 MG TABS tablet Take 1 tablet (20 mg total) by mouth daily with supper. 30 tablet 11  . rosuvastatin (CRESTOR) 10 MG tablet Take 1 tablet (10 mg total) by mouth daily. 90 tablet 3  . SYNTHROID 50 MCG tablet as directed.  7  . SYNTHROID 75 MCG tablet Take 50-75 mcg by mouth daily before breakfast. Alternates between 50 mcg and 75 mcg daily  2  . temazepam (RESTORIL) 30 MG capsule Take 30 mg by mouth at bedtime.      No current facility-administered medications for this visit.     Allergies:   Patient has no known allergies.    Social History:  The patient  reports that she quit smoking about 36 years ago. Her smoking use included Cigarettes. She has never used smokeless tobacco. She reports that she drinks about 8.4 oz of alcohol per week . She reports that she does not use drugs.   Family History:  The patient's family history includes CAD in her brother; Congestive Heart Failure in her mother; Dementia in her mother; Diabetes in her paternal grandfather; Heart attack (age of onset: 10) in her father; Hypertension in her mother; Leukemia in her daughter; Lung cancer in her son.    ROS:  Please see the history of present illness.   Otherwise, review of systems are positive for none.   All other systems are reviewed and negative.    PHYSICAL EXAM: VS:  BP 129/70   Pulse 65   Ht 5\' 1"  (1.549 m)   Wt 57.6 kg (127 lb)   BMI 24.00 kg/m  , BMI Body mass index is 24 kg/m. GENERAL:  Well appearing.  Appears younger than stated age. HEENT: Pupils equal round and reactive, fundi not visualized, oral mucosa unremarkable NECK:   No jugular venous distention, waveform within normal limits, carotid upstroke brisk and symmetric, no bruits, no thyromegaly LUNGS:  Clear to auscultation bilaterally.  No crackles, rhonchi, or wheezes. HEART:  RRR.  PMI not displaced or sustained,S1 and S2 within normal limits, no S3, no S4, no clicks, no rubs, no murmurs ABD:  Flat, positive bowel sounds normal in frequency in pitch, no bruits, no rebound, no guarding, no midline pulsatile mass, no hepatomegaly, no splenomegaly EXT:  2 plus pulses throughout, no edema, no cyanosis no clubbing SKIN:  No rashes no nodules NEURO:  Cranial nerves II through XII grossly intact, motor grossly intact throughout PSYCH:  Cognitively intact, oriented  to person place and time   EKG:  EKG is ordered today. 12/07/16: AP VS.  Rate 65 bpm.  Non-specific t wave abnormalities.    Echo 06/05/15: Study Conclusions  - Left ventricle: The cavity size was normal. Wall thickness was  normal. Systolic function was normal. The estimated ejection  fraction was in the range of 55% to 60%. Wall motion was normal;  there were no regional wall motion abnormalities. - Aortic valve: Trileaflet; mildly thickened, mildly calcified  leaflets. There was mild regurgitation. - Mitral valve: Calcified annulus. - Left atrium: The atrium was mildly dilated. - Right ventricle: Pacer wire or catheter noted in right ventricle.  Impressions:  - Compared to the prior study, there has been no significant  interval change.   Recent Labs: No results found for requested labs within last 8760 hours.  02/28/15: TSH 7.24, free T4 130  04/02/16: Sodium 136, potassium 4.3, BUN 22, creatinine 0.8 WBC 6.91, hemoglobin, 12.9, hematocrit 41.2, platelets 208 Total cholesterol 163, triglycerides 45, HDL 70, LDL 84 TSH 2.12, free T4 1.3  Lipid Panel    Component Value Date/Time   CHOL 167 10/08/2015 0848   TRIG 56 10/08/2015 0848   HDL 94 10/08/2015 0848   CHOLHDL 1.8  10/08/2015 0848   VLDL 11 10/08/2015 0848   LDLCALC 62 10/08/2015 0848      Wt Readings from Last 3 Encounters:  12/07/16 57.6 kg (127 lb)  11/12/16 58.2 kg (128 lb 4 oz)  06/04/16 57.5 kg (126 lb 12.8 oz)     ASSESSMENT AND PLAN:  # Atrial fibrillation: No atrial fibrillation noted on recent device interrogation.  Her palpitations and liver function were have been better-controlled recently.  Continue amiodarone diltiazem, metoprolol, and Xarelto.  Thyroid function and liver function were normal 03/2016.  We will refer her for pulmonary function testing at follow-up.  This patients CHA2DS2-VASc Score and unadjusted Ischemic Stroke Rate (% per year) is equal to 4.8 % stroke rate/year from a score of 4 Above score calculated as 1 point each if present [CHF, HTN, DM, Vascular=MI/PAD/Aortic Plaque, Age if 65-74, or Female] Above score calculated as 2 points each if present [Age > 75, or Stroke/TIA/TE]  # Hyperlipidemia: ASCVD 10 year risk is 34%.  LDL 84 03/2016.  # Asymptomatic coronary calcification: Asymptomatic.  She exercises regularly.  Continue rosuvastatin and Xarelto.    Current medicines are reviewed at length with the patient today.  The patient does not have concerns regarding medicines.  The following changes have been made:  None   Labs/ tests ordered today include:   No orders of the defined types were placed in this encounter.   Disposition:   FU with Grazia Taffe C. Oval Linsey, MD, [facility] in 6 months     This note was written with the assistance of speech recognition software.  Please excuse any transcriptional errors.  Signed, Deyton Ellenbecker C. Oval Linsey, MD, [facility]  12/07/2016 2:54 PM    [facility] thinks the myasthenia antibody regularly. Be called if

## 2016-12-07 NOTE — Patient Instructions (Signed)

## 2016-12-09 DIAGNOSIS — D225 Melanocytic nevi of trunk: Secondary | ICD-10-CM | POA: Diagnosis not present

## 2016-12-09 DIAGNOSIS — L814 Other melanin hyperpigmentation: Secondary | ICD-10-CM | POA: Diagnosis not present

## 2016-12-09 DIAGNOSIS — L821 Other seborrheic keratosis: Secondary | ICD-10-CM | POA: Diagnosis not present

## 2016-12-14 ENCOUNTER — Other Ambulatory Visit (HOSPITAL_COMMUNITY): Payer: Self-pay | Admitting: Nurse Practitioner

## 2016-12-14 DIAGNOSIS — J310 Chronic rhinitis: Secondary | ICD-10-CM | POA: Diagnosis not present

## 2016-12-22 DIAGNOSIS — M9903 Segmental and somatic dysfunction of lumbar region: Secondary | ICD-10-CM | POA: Diagnosis not present

## 2016-12-22 DIAGNOSIS — M5116 Intervertebral disc disorders with radiculopathy, lumbar region: Secondary | ICD-10-CM | POA: Diagnosis not present

## 2017-01-13 DIAGNOSIS — J310 Chronic rhinitis: Secondary | ICD-10-CM | POA: Diagnosis not present

## 2017-02-21 ENCOUNTER — Other Ambulatory Visit (HOSPITAL_COMMUNITY): Payer: Self-pay | Admitting: *Deleted

## 2017-02-21 MED ORDER — METOPROLOL SUCCINATE ER 50 MG PO TB24: 50.0000 mg | ORAL_TABLET | Freq: Two times a day (BID) | ORAL | 3 refills | Status: DC

## 2017-02-28 ENCOUNTER — Other Ambulatory Visit: Payer: Self-pay | Admitting: Cardiovascular Disease

## 2017-02-28 ENCOUNTER — Ambulatory Visit (INDEPENDENT_AMBULATORY_CARE_PROVIDER_SITE_OTHER): Payer: Medicare Other | Admitting: *Deleted

## 2017-02-28 DIAGNOSIS — I495 Sick sinus syndrome: Secondary | ICD-10-CM

## 2017-02-28 NOTE — Progress Notes (Signed)
Remote pacemaker transmission.   

## 2017-03-01 ENCOUNTER — Encounter: Payer: Self-pay | Admitting: Cardiology

## 2017-03-01 NOTE — Telephone Encounter (Signed)
Refill Request.  

## 2017-03-02 ENCOUNTER — Ambulatory Visit: Payer: Medicare Other | Admitting: Internal Medicine

## 2017-03-02 ENCOUNTER — Encounter: Payer: Self-pay | Admitting: Internal Medicine

## 2017-03-02 VITALS — BP 124/58 | HR 66 | Ht 61.0 in | Wt 128.0 lb

## 2017-03-02 DIAGNOSIS — Z950 Presence of cardiac pacemaker: Secondary | ICD-10-CM

## 2017-03-02 DIAGNOSIS — I10 Essential (primary) hypertension: Secondary | ICD-10-CM

## 2017-03-02 DIAGNOSIS — I480 Paroxysmal atrial fibrillation: Secondary | ICD-10-CM | POA: Diagnosis not present

## 2017-03-02 DIAGNOSIS — I495 Sick sinus syndrome: Secondary | ICD-10-CM

## 2017-03-02 LAB — CUP PACEART INCLINIC DEVICE CHECK
Brady Statistic AP VP Percent: 0.05 %
Brady Statistic AS VS Percent: 3.23 %
Brady Statistic RA Percent Paced: 96.75 %
Date Time Interrogation Session: 20190123151532
Implantable Lead Implant Date: 20101109
Implantable Lead Location: 753859
Implantable Lead Location: 753860
Implantable Lead Model: 4470
Implantable Lead Serial Number: 535369
Lead Channel Pacing Threshold Amplitude: 0.5 V
Lead Channel Pacing Threshold Amplitude: 1 V
Lead Channel Sensing Intrinsic Amplitude: 10.042 mV
Lead Channel Setting Pacing Amplitude: 2 V
Lead Channel Setting Pacing Amplitude: 2.5 V
Lead Channel Setting Sensing Sensitivity: 0.9 mV
MDC IDC LEAD IMPLANT DT: 20101109
MDC IDC LEAD SERIAL: 657967
MDC IDC MSMT BATTERY VOLTAGE: 2.92 V
MDC IDC MSMT LEADCHNL RA IMPEDANCE VALUE: 456 Ohm
MDC IDC MSMT LEADCHNL RA PACING THRESHOLD PULSEWIDTH: 0.6 ms
MDC IDC MSMT LEADCHNL RV IMPEDANCE VALUE: 432 Ohm
MDC IDC MSMT LEADCHNL RV PACING THRESHOLD PULSEWIDTH: 0.4 ms
MDC IDC PG IMPLANT DT: 20101109
MDC IDC SET LEADCHNL RV PACING PULSEWIDTH: 0.4 ms
MDC IDC STAT BRADY AP VS PERCENT: 96.7 %
MDC IDC STAT BRADY AS VP PERCENT: 0.01 %
MDC IDC STAT BRADY RV PERCENT PACED: 0.07 %

## 2017-03-02 LAB — CUP PACEART REMOTE DEVICE CHECK
Brady Statistic AP VS Percent: 99.76 %
Brady Statistic AS VP Percent: 0.01 %
Brady Statistic AS VS Percent: 0.21 %
Brady Statistic RA Percent Paced: 99.78 %
Brady Statistic RV Percent Paced: 0.04 %
Implantable Lead Implant Date: 20101109
Implantable Lead Implant Date: 20101109
Implantable Lead Location: 753859
Implantable Lead Model: 4469
Implantable Lead Model: 4470
Implantable Lead Serial Number: 535369
Lead Channel Impedance Value: 464 Ohm
Lead Channel Sensing Intrinsic Amplitude: 0.663 mV
Lead Channel Setting Pacing Amplitude: 2 V
Lead Channel Setting Pacing Amplitude: 2.5 V
Lead Channel Setting Sensing Sensitivity: 0.9 mV
MDC IDC LEAD LOCATION: 753860
MDC IDC LEAD SERIAL: 657967
MDC IDC MSMT BATTERY VOLTAGE: 2.92 V
MDC IDC MSMT LEADCHNL RV IMPEDANCE VALUE: 448 Ohm
MDC IDC MSMT LEADCHNL RV SENSING INTR AMPL: 11.046 mV
MDC IDC PG IMPLANT DT: 20101109
MDC IDC SESS DTM: 20190121145036
MDC IDC SET LEADCHNL RV PACING PULSEWIDTH: 0.4 ms
MDC IDC STAT BRADY AP VP PERCENT: 0.03 %

## 2017-03-02 NOTE — Telephone Encounter (Signed)
Rx has been sent to the pharmacy electronically. ° °

## 2017-03-02 NOTE — Progress Notes (Signed)
PCP: Prince Solian, MD Primary Cardiologist:  Dr Oval Linsey Primary EP:  Dr Rayann Heman  Kathryn Gibson is a 84 y.o. female who presents today for routine electrophysiology followup.  Since last being seen in our clinic, the patient reports doing very well.  Today, she denies symptoms of palpitations, chest pain, shortness of breath,  lower extremity edema, dizziness, presyncope, or syncope.  The patient is otherwise without complaint today.   Past Medical History:  Diagnosis Date  . Arthritis   . Atrial fibrillation ([place])   . Bradycardia    s/p PPM  . Cataract   . Colonic polyp    adenomatous  . Constipation   . Diverticulosis   . Hemorrhoids   . Hypertension   . Hypothyroidism   . Irritable bowel syndrome (IBS)   . Pacemaker 2010   Past Surgical History:  Procedure Laterality Date  . BREAST CYST ASPIRATION Left 1980s  . COLONOSCOPY    . ELECTROPHYSIOLOGIC STUDY N/A 07/01/2015   Procedure: Atrial Fibrillation Ablation;  Surgeon: Thompson Grayer, MD;  Location: [place] [facility];  Service: Cardiovascular;  Laterality: N/A;  . EYE SURGERY    . HEMATOMA EVACUATION Right 07/15/2014   Procedure: EVACUATION Right Groin Hematoma;  Surgeon: Rosetta Posner, MD;  Location: [place];  Service: Vascular;  Laterality: Right;  . NASAL ENDOSCOPY WITH EPISTAXIS CONTROL N/A 07/14/2014   Procedure: NASAL ENDOSCOPY WITH EPISTAXIS CONTROL;  Surgeon: Ruby Cola, MD;  Location: [place];  Service: ENT;  Laterality: N/A;  . NASAL ENDOSCOPY WITH EPISTAXIS CONTROL Bilateral 07/15/2014   Procedure: NASAL ENDOSCOPY WITH EPISTAXIS CONTROL;  Surgeon: Ruby Cola, MD;  Location: [place];  Service: ENT;  Laterality: Bilateral;  . PACEMAKER INSERTION  2010   implanted by Dr Doreatha Lew (MDT)  . RADIOLOGY WITH ANESTHESIA N/A 07/14/2014   Procedure: RADIOLOGY WITH ANESTHESIA;  Surgeon: Medication Radiologist, MD;  Location: [place];  Service: Radiology;  Laterality: N/A;  . TEE WITHOUT CARDIOVERSION N/A 07/01/2015   Procedure:  TRANSESOPHAGEAL ECHOCARDIOGRAM (TEE);  Surgeon: Sueanne Margarita, MD;  Location: [facility] ENDOSCOPY;  Service: Cardiovascular;  Laterality: N/A;    ROS- all systems are reviewed and negative except as per HPI above  Current Outpatient Medications  Medication Sig Dispense Refill  . acetaminophen (TYLENOL) 500 MG tablet Take 500-1,000 mg by mouth every 6 (six) hours as needed for moderate pain or headache.    Marland Kitchen amiodarone (PACERONE) 200 MG tablet TAKE ONE-HALF TABLET BY MOUTH ONCE DAILY 45 tablet 0  . Ascorbic Acid (VITAMIN C) 1000 MG tablet Take 1,000 mg by mouth daily.      . Calcium-Vitamin D (CALTRATE 600 PLUS-VIT D PO) Take 1 tablet by mouth daily.    . cholecalciferol (VITAMIN D) 1000 UNITS tablet Take 1,000 Units by mouth daily.      Marland Kitchen diltiazem (CARDIZEM CD) 360 MG 24 hr capsule TAKE 1 CAPSULE BY MOUTH ONCE DAILY 30 capsule 6  . hydrocortisone (ANUSOL-HC) 25 MG suppository Place 1 suppository (25 mg total) rectally at bedtime. QHSfor 7 weeks 7 suppository 0  . LYSINE PO Take 1-2 tablets by mouth daily as needed (fever blister).    . metoprolol succinate (TOPROL-XL) 50 MG 24 hr tablet Take 1 tablet (50 mg total) by mouth 2 (two) times daily. Take with or immediately following a meal. 60 tablet 3  . Multiple Vitamin (MULTIVITAMIN) capsule Take 1 capsule by mouth daily.      . Multiple Vitamins-Minerals (ICAPS AREDS 2 PO) Take by mouth  daily.    . Omega-3 Fatty Acids (FISH OIL) 1200 MG CAPS Take 1 capsule by mouth daily. Mega Red.    . polyethylene glycol powder (GLYCOLAX/MIRALAX) powder Take 2 capfuls by mouth daily  0  . Probiotic Product (ALIGN PO) Take 1 capsule by mouth daily.     . rivaroxaban (XARELTO) 20 MG TABS tablet Take 1 tablet (20 mg total) by mouth daily with supper. 30 tablet 11  . rosuvastatin (CRESTOR) 10 MG tablet Take 1 tablet (10 mg total) by mouth daily. 90 tablet 3  . SYNTHROID 50 MCG tablet as directed.  7  . SYNTHROID 75 MCG tablet Take 50-75 mcg by mouth daily before  breakfast. Alternates between 50 mcg and 75 mcg daily  2  . temazepam (RESTORIL) 30 MG capsule Take 30 mg by mouth at bedtime.      No current facility-administered medications for this visit.     Physical Exam: Vitals:   03/02/17 1426  BP: (!) 124/58  Pulse: 66  Weight: 128 lb (58.1 kg)  Height: 5\' 1"  (1.549 m)    GEN- The patient is well appearing, alert and oriented x 3 today.   Head- normocephalic, atraumatic Eyes-  Sclera clear, conjunctiva pink Ears- hearing intact Oropharynx- clear Lungs- Clear to ausculation bilaterally, normal work of breathing Chest- pacemaker pocket is well healed Heart- Regular rate and rhythm, no murmurs, rubs or gallops, PMI not laterally displaced GI- soft, NT, ND, + BS Extremities- no clubbing, cyanosis, or edema  Pacemaker interrogation- reviewed in detail today,  See PACEART report  ekg tracing ordered today is personally reviewed and shows atrial paced rhythm, nonspecific ST/ T changes  Assessment and Plan:  1. Symptomatic sinus bradycardia  Normal pacemaker function See Pace Art report No changes today  2. afib Doing well Rare atach episodes (<0.1%),  No afib On xarelto and low dose amiodarone Labs [facility] at [facility]   3. HTN Stable No change required today  [facility] Return to see EP NP in a year  Thompson Grayer MD, [facility] 03/02/2017 2:41 PM

## 2017-03-02 NOTE — Patient Instructions (Addendum)
Medication Instructions:  Your physician recommends that you continue on your current medications as directed. Please refer to the Current Medication list given to you today.   Labwork: None ordered.  Testing/Procedures: None ordered.  Follow-Up: Your physician wants you to follow-up in: One year with Chanetta Marshall, NP. You will receive a reminder letter in the mail two months in advance. If you don't receive a letter, please call our office to schedule the follow-up appointment.  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 This monitoring reduces the number of office visits required to check your device to one time per year. It allows [place] to keep an eye on the functioning of your device to ensure it is working properly. You are scheduled for a device check from home on 05/30/2017. You may send your transmission at any time that day. If you have a wireless device, the transmission will be sent automatically. After your physician reviews your transmission, you will receive a postcard with your next transmission date.    Any Other Special Instructions Will Be Listed Below (If Applicable).     If you need a refill on your cardiac medications before your next appointment, please call your pharmacy.

## 2017-03-03 DIAGNOSIS — M9903 Segmental and somatic dysfunction of lumbar region: Secondary | ICD-10-CM | POA: Diagnosis not present

## 2017-03-03 DIAGNOSIS — M5116 Intervertebral disc disorders with radiculopathy, lumbar region: Secondary | ICD-10-CM | POA: Diagnosis not present

## 2017-03-07 DIAGNOSIS — M5116 Intervertebral disc disorders with radiculopathy, lumbar region: Secondary | ICD-10-CM | POA: Diagnosis not present

## 2017-03-07 DIAGNOSIS — M9903 Segmental and somatic dysfunction of lumbar region: Secondary | ICD-10-CM | POA: Diagnosis not present

## 2017-03-09 DIAGNOSIS — M5116 Intervertebral disc disorders with radiculopathy, lumbar region: Secondary | ICD-10-CM | POA: Diagnosis not present

## 2017-03-09 DIAGNOSIS — M9903 Segmental and somatic dysfunction of lumbar region: Secondary | ICD-10-CM | POA: Diagnosis not present

## 2017-03-15 DIAGNOSIS — M5116 Intervertebral disc disorders with radiculopathy, lumbar region: Secondary | ICD-10-CM | POA: Diagnosis not present

## 2017-03-15 DIAGNOSIS — M9903 Segmental and somatic dysfunction of lumbar region: Secondary | ICD-10-CM | POA: Diagnosis not present

## 2017-03-16 DIAGNOSIS — E7849 Other hyperlipidemia: Secondary | ICD-10-CM | POA: Diagnosis not present

## 2017-03-16 DIAGNOSIS — I10 Essential (primary) hypertension: Secondary | ICD-10-CM | POA: Diagnosis not present

## 2017-03-16 DIAGNOSIS — M859 Disorder of bone density and structure, unspecified: Secondary | ICD-10-CM | POA: Diagnosis not present

## 2017-03-16 DIAGNOSIS — R82998 Other abnormal findings in urine: Secondary | ICD-10-CM | POA: Diagnosis not present

## 2017-03-16 DIAGNOSIS — E038 Other specified hypothyroidism: Secondary | ICD-10-CM | POA: Diagnosis not present

## 2017-03-23 DIAGNOSIS — I2510 Atherosclerotic heart disease of native coronary artery without angina pectoris: Secondary | ICD-10-CM | POA: Diagnosis not present

## 2017-03-23 DIAGNOSIS — Z0000 Encounter for general adult medical examination without abnormal findings: Secondary | ICD-10-CM | POA: Diagnosis not present

## 2017-03-23 DIAGNOSIS — J069 Acute upper respiratory infection, unspecified: Secondary | ICD-10-CM | POA: Diagnosis not present

## 2017-03-23 DIAGNOSIS — Z7189 Other specified counseling: Secondary | ICD-10-CM | POA: Diagnosis not present

## 2017-03-23 DIAGNOSIS — N390 Urinary tract infection, site not specified: Secondary | ICD-10-CM | POA: Diagnosis not present

## 2017-03-31 DIAGNOSIS — Z1212 Encounter for screening for malignant neoplasm of rectum: Secondary | ICD-10-CM | POA: Diagnosis not present

## 2017-04-01 DIAGNOSIS — L508 Other urticaria: Secondary | ICD-10-CM | POA: Diagnosis not present

## 2017-04-04 DIAGNOSIS — M5116 Intervertebral disc disorders with radiculopathy, lumbar region: Secondary | ICD-10-CM | POA: Diagnosis not present

## 2017-04-04 DIAGNOSIS — M9903 Segmental and somatic dysfunction of lumbar region: Secondary | ICD-10-CM | POA: Diagnosis not present

## 2017-04-25 DIAGNOSIS — M9903 Segmental and somatic dysfunction of lumbar region: Secondary | ICD-10-CM | POA: Diagnosis not present

## 2017-04-25 DIAGNOSIS — M5116 Intervertebral disc disorders with radiculopathy, lumbar region: Secondary | ICD-10-CM | POA: Diagnosis not present

## 2017-05-13 ENCOUNTER — Telehealth: Payer: Self-pay | Admitting: *Deleted

## 2017-05-13 ENCOUNTER — Ambulatory Visit (INDEPENDENT_AMBULATORY_CARE_PROVIDER_SITE_OTHER): Payer: Medicare Other | Admitting: *Deleted

## 2017-05-13 DIAGNOSIS — Z950 Presence of cardiac pacemaker: Secondary | ICD-10-CM

## 2017-05-13 NOTE — Telephone Encounter (Signed)
Spoke with patient and advised that we will start doing monthly battery checks for her PPM.  Also made her aware of potential symptoms with VVI 65 and encouraged her to call our office if symptoms are noted.  Patient is agreeable.  She will transmit today, and again on 06/13/17.  Patient denies additional questions or concerns at this time.

## 2017-05-16 NOTE — Progress Notes (Signed)
Remote pacemaker transmission.   

## 2017-05-17 LAB — CUP PACEART REMOTE DEVICE CHECK
Brady Statistic AP VS Percent: 99.66 %
Brady Statistic AS VP Percent: 0 %
Brady Statistic RA Percent Paced: 99.72 %
Date Time Interrogation Session: 20190405154428
Implantable Lead Implant Date: 20101109
Implantable Lead Implant Date: 20101109
Implantable Lead Location: 753859
Implantable Lead Model: 4469
Implantable Lead Model: 4470
Implantable Lead Serial Number: 657967
Lead Channel Impedance Value: 432 Ohm
Lead Channel Impedance Value: 448 Ohm
Lead Channel Sensing Intrinsic Amplitude: 1.06 mV
Lead Channel Sensing Intrinsic Amplitude: 8.703 mV
Lead Channel Setting Pacing Amplitude: 2.5 V
Lead Channel Setting Sensing Sensitivity: 0.9 mV
MDC IDC LEAD LOCATION: 753860
MDC IDC LEAD SERIAL: 535369
MDC IDC MSMT BATTERY VOLTAGE: 2.92 V
MDC IDC PG IMPLANT DT: 20101109
MDC IDC SET LEADCHNL RA PACING AMPLITUDE: 2 V
MDC IDC SET LEADCHNL RV PACING PULSEWIDTH: 0.4 ms
MDC IDC STAT BRADY AP VP PERCENT: 0.07 %
MDC IDC STAT BRADY AS VS PERCENT: 0.27 %
MDC IDC STAT BRADY RV PERCENT PACED: 0.07 %

## 2017-05-18 ENCOUNTER — Encounter: Payer: Self-pay | Admitting: Cardiology

## 2017-06-09 ENCOUNTER — Encounter: Payer: Self-pay | Admitting: Cardiovascular Disease

## 2017-06-09 ENCOUNTER — Ambulatory Visit: Payer: Medicare Other | Admitting: Cardiovascular Disease

## 2017-06-09 VITALS — BP 125/68 | HR 65 | Ht 61.5 in | Wt 127.8 lb

## 2017-06-09 DIAGNOSIS — I480 Paroxysmal atrial fibrillation: Secondary | ICD-10-CM

## 2017-06-09 DIAGNOSIS — I495 Sick sinus syndrome: Secondary | ICD-10-CM

## 2017-06-09 DIAGNOSIS — E7800 Pure hypercholesterolemia, unspecified: Secondary | ICD-10-CM | POA: Diagnosis not present

## 2017-06-09 DIAGNOSIS — Z79899 Other long term (current) drug therapy: Secondary | ICD-10-CM | POA: Diagnosis not present

## 2017-06-09 DIAGNOSIS — I10 Essential (primary) hypertension: Secondary | ICD-10-CM | POA: Diagnosis not present

## 2017-06-09 DIAGNOSIS — I259 Chronic ischemic heart disease, unspecified: Secondary | ICD-10-CM

## 2017-06-09 DIAGNOSIS — Z950 Presence of cardiac pacemaker: Secondary | ICD-10-CM | POA: Diagnosis not present

## 2017-06-09 DIAGNOSIS — Z5181 Encounter for therapeutic drug level monitoring: Secondary | ICD-10-CM | POA: Diagnosis not present

## 2017-06-09 DIAGNOSIS — Z7901 Long term (current) use of anticoagulants: Secondary | ICD-10-CM | POA: Diagnosis not present

## 2017-06-09 NOTE — Progress Notes (Signed)
Cardiology Office Note   Date:  06/09/2017   ID:  Kathryn Gibson, DOB 1933/09/05, MRN [id number]  PCP:  Prince Solian, MD  Cardiologist:   [name], MD  Electrophysiologist: Thompson Grayer  No chief complaint on file.    History of Present Illness: Kathryn Gibson is a 84 y.o. female with atrial fibrillation s/p ablation, SSS s/p PPM, asymptomatic coronary calcifications, and  hypertension, who presents for follow-up.  Kathryn Gibson  was previously a patient of Dr. Mare Ferrari.  She underwent ablation for atrial fibrillation on 07/01/15.  She last had an episode of atrial fibrillation 12/2015.  She has been doing well and rarely has palpitations that don't last very long.  She is nearing ERI and her PPM batter is followed monthly.  She continues to exercise nearly every day per week.  She has no exertional symptoms.  She has mild edema attributable to varicose veins that is better when she uses compression stockings.  She has no orthopnea or PND.   Past Medical History:  Diagnosis Date  . Arthritis   . Atrial fibrillation ([place])   . Bradycardia    s/p PPM  . Cataract   . Colonic polyp    adenomatous  . Constipation   . Diverticulosis   . Hemorrhoids   . Hypertension   . Hypothyroidism   . Irritable bowel syndrome (IBS)   . Pacemaker 2010    Past Surgical History:  Procedure Laterality Date  . BREAST CYST ASPIRATION Left 1980s  . COLONOSCOPY    . ELECTROPHYSIOLOGIC STUDY N/A 07/01/2015   Procedure: Atrial Fibrillation Ablation;  Surgeon: Thompson Grayer, MD;  Location: [facility];  Service: Cardiovascular;  Laterality: N/A;  . EYE SURGERY    . HEMATOMA EVACUATION Right 07/15/2014   Procedure: EVACUATION Right Groin Hematoma;  Surgeon: Rosetta Posner, MD;  Location: [facility];  Service: Vascular;  Laterality: Right;  . NASAL ENDOSCOPY WITH EPISTAXIS CONTROL N/A 07/14/2014   Procedure: NASAL ENDOSCOPY WITH EPISTAXIS CONTROL;  Surgeon: Ruby Cola, MD;  Location: [facility];   Service: ENT;  Laterality: N/A;  . NASAL ENDOSCOPY WITH EPISTAXIS CONTROL Bilateral 07/15/2014   Procedure: NASAL ENDOSCOPY WITH EPISTAXIS CONTROL;  Surgeon: Ruby Cola, MD;  Location: [facility];  Service: ENT;  Laterality: Bilateral;  . PACEMAKER INSERTION  2010   implanted by Dr Doreatha Lew ([facility])  . RADIOLOGY WITH ANESTHESIA N/A 07/14/2014   Procedure: RADIOLOGY WITH ANESTHESIA;  Surgeon: Medication Radiologist, MD;  Location: [facility];  Service: Radiology;  Laterality: N/A;  . TEE WITHOUT CARDIOVERSION N/A 07/01/2015   Procedure: TRANSESOPHAGEAL ECHOCARDIOGRAM (TEE);  Surgeon: Sueanne Margarita, MD;  Location: [facility];  Service: Cardiovascular;  Laterality: N/A;     Current Outpatient Medications  Medication Sig Dispense Refill  . acetaminophen (TYLENOL) 500 MG tablet Take 500-1,000 mg by mouth every 6 (six) hours as needed for moderate pain or headache.    Marland Kitchen amiodarone (PACERONE) 200 MG tablet TAKE ONE-HALF TABLET BY MOUTH ONCE DAILY 45 tablet 0  . Ascorbic Acid (VITAMIN C) 1000 MG tablet Take 1,000 mg by mouth daily.      . Calcium-Vitamin D (CALTRATE 600 PLUS-VIT D PO) Take 1 tablet by mouth daily.    . cholecalciferol (VITAMIN D) 1000 UNITS tablet Take 1,000 Units by mouth daily.      Marland Kitchen diltiazem (CARDIZEM CD) 360 MG 24 hr capsule TAKE 1 CAPSULE BY MOUTH ONCE DAILY 30 capsule 6  . hydrocortisone (ANUSOL-HC) 25 MG suppository Place  1 suppository (25 mg total) rectally at bedtime. QHSfor 7 weeks 7 suppository 0  . LYSINE PO Take 1-2 tablets by mouth daily as needed (fever blister).    . Magnesium 250 MG TABS Take by mouth daily.    . metoprolol succinate (TOPROL-XL) 50 MG 24 hr tablet Take 1 tablet (50 mg total) by mouth 2 (two) times daily. Take with or immediately following a meal. 60 tablet 3  . Multiple Vitamin (MULTIVITAMIN) capsule Take 1 capsule by mouth daily.      . Multiple Vitamins-Minerals (ICAPS AREDS 2 PO) Take by mouth daily.    . Omega-3 Fatty Acids (FISH OIL) 1200 MG CAPS Take 1  capsule by mouth daily. Mega Red.    . polyethylene glycol powder (GLYCOLAX/MIRALAX) powder Take 2 capfuls by mouth daily  0  . Probiotic Product (ALIGN PO) Take 1 capsule by mouth daily.     . rivaroxaban (XARELTO) 20 MG TABS tablet Take 1 tablet (20 mg total) by mouth daily with supper. 30 tablet 11  . rosuvastatin (CRESTOR) 10 MG tablet Take 1 tablet (10 mg total) by mouth daily. 90 tablet 3  . SYNTHROID 50 MCG tablet as directed.  7  . SYNTHROID 75 MCG tablet Take 50-75 mcg by mouth daily before breakfast. Alternates between 50 mcg and 75 mcg daily  2  . temazepam (RESTORIL) 30 MG capsule Take 30 mg by mouth at bedtime.      No current facility-administered medications for this visit.     Allergies:   Patient has no known allergies.    Social History:  The patient  reports that she quit smoking about 37 years ago. Her smoking use included cigarettes. She has never used smokeless tobacco. She reports that she drinks about 8.4 oz of alcohol per week. She reports that she does not use drugs.   Family History:  The patient's family history includes CAD in her brother; Congestive Heart Failure in her mother; Dementia in her mother; Diabetes in her paternal grandfather; Heart attack (age of onset: 42) in her father; Hypertension in her mother; Leukemia in her daughter; Lung cancer in her son.    ROS:  Please see the history of present illness.   Otherwise, review of systems are positive for none.   All other systems are reviewed and negative.    PHYSICAL EXAM: VS:  BP 125/68   Pulse 65   Ht 5' 1.5" (1.562 m)   Wt 127 lb 12.8 oz (58 kg)   BMI 23.76 kg/m  , BMI Body mass index is 23.76 kg/m. GENERAL:  Well appearing.  No acute distress. HEENT: Pupils equal round and reactive, fundi not visualized, oral mucosa unremarkable NECK:  No jugular venous distention, waveform within normal limits, carotid upstroke brisk and symmetric, no bruits LUNGS:  Clear to auscultation bilaterally HEART:   RRR.  PMI not displaced or sustained,S1 and S2 within normal limits, no S3, no S4, no clicks, no rubs, no murmurs ABD:  Flat, positive bowel sounds normal in frequency in pitch, no bruits, no rebound, no guarding, no midline pulsatile mass, no hepatomegaly, no splenomegaly EXT:  2 plus pulses throughout, no edema, no cyanosis no clubbing SKIN:  No rashes no nodules NEURO:  Cranial nerves II through XII grossly intact, motor grossly intact throughout PSYCH:  Cognitively intact, oriented to person place and time   EKG:  EKG is not ordered today. 12/07/16: AP VS.  Rate 65 bpm.  Non-specific t wave abnormalities.  Echo 06/05/15: Study Conclusions  - Left ventricle: The cavity size was normal. Wall thickness was  normal. Systolic function was normal. The estimated ejection  fraction was in the range of 55% to 60%. Wall motion was normal;  there were no regional wall motion abnormalities. - Aortic valve: Trileaflet; mildly thickened, mildly calcified  leaflets. There was mild regurgitation. - Mitral valve: Calcified annulus. - Left atrium: The atrium was mildly dilated. - Right ventricle: Pacer wire or catheter noted in right ventricle.  Impressions:  - Compared to the prior study, there has been no significant  interval change.   Recent Labs: No results found for requested labs within last 8760 hours.  02/28/15: TSH 7.24, free T4 130  04/02/16: Sodium 136, potassium 4.3, BUN 22, creatinine 0.8 WBC 6.91, hemoglobin, 12.9, hematocrit 41.2, platelets 208 Total cholesterol 163, triglycerides 45, HDL 70, LDL 84 TSH 2.12, free T4 1.3  03/23/2017:  Total cholesterol 161, triglycerides 46, HDL 73, LDL 79 ALT 25  Lipid Panel    Component Value Date/Time   CHOL 167 10/08/2015 0848   TRIG 56 10/08/2015 0848   HDL 94 10/08/2015 0848   CHOLHDL 1.8 10/08/2015 0848   VLDL 11 10/08/2015 0848   LDLCALC 62 10/08/2015 0848      Wt Readings from Last 3 Encounters:  06/09/17 127  lb 12.8 oz (58 kg)  03/02/17 128 lb (58.1 kg)  12/07/16 127 lb (57.6 kg)     ASSESSMENT AND PLAN:  # Atrial fibrillation:  Kathryn Gibson remains in sinus rhythm.  She is still on amiodarone.  We will check PFTs.  Thyroid function and liver function were normal 03/2017.  Continue Xarelto.  This patients CHA2DS2-VASc Score and unadjusted Ischemic Stroke Rate (% per year) is equal to 4.8 % stroke rate/year from a score of 4 Above score calculated as 1 point each if present [CHF, HTN, DM, Vascular=MI/PAD/Aortic Plaque, Age if 65-74, or Female] Above score calculated as 2 points each if present [Age > 75, or Stroke/TIA/TE]  # Hyperlipidemia: ASCVD 10 year risk is 34%.  LDL 79 03/2017.  # Asymptomatic coronary calcification: Asymptomatic.  She exercises regularly.  Continue rosuvastatin and Xarelto.    Current medicines are reviewed at length with the patient today.  The patient does not have concerns regarding medicines.  The following changes have been made:  None   Labs/ tests ordered today include:   Orders Placed This Encounter  Procedures  . Pulmonary function test    Disposition:   FU with Kathryn Gibson C. Oval Linsey, MD, [facility] in 6 months      Signed, Sherrell Weir C. Oval Linsey, MD, [facility]  06/09/2017 2:38 PM    [facility]

## 2017-06-09 NOTE — Patient Instructions (Addendum)
Medication Instructions:  Your physician recommends that you continue on your current medications as directed. Please refer to the Current Medication list given to you today.  Labwork: none  Testing/Procedures: Your physician has recommended that you have a pulmonary function test. Pulmonary Function Tests are a group of tests that measure how well air moves in and out of your lungs.  Follow-Up: Your physician wants you to follow-up in: 6 month ov  You will receive a reminder letter in the mail two months in advance. If you don't receive a letter, please call our office to schedule the follow-up appointment.  If you need a refill on your cardiac medications before your next appointment, please call your pharmacy.   Pulmonary Function Tests Pulmonary function tests (PFTs) are used to measure how well your lungs work, find out what is causing your lung problems, and figure out the best treatment for you. You may have PFTs:  When you have an illness involving the lungs.  To follow changes in your lung function over time if you have a chronic lung disease.  If you are an Nature conservation officer. This checks the effects of being exposed to chemicals over a long period of time.  To check lung function before having surgery or other procedures.  To check your lungs if you smoke.  To check if prescribed medicines or treatments are helping your lungs.  Your results will be compared to the expected lung function of someone with healthy lungs who is similar to you in:  Age.  Gender.  Height.  Weight.  Race or ethnicity.  This is done to show how your lungs compare to normal lung function (percent predicted). This is how your health care provider knows if your lung function is normal or not. If you have had PFTs done before, your health care provider will compare your current results with past results. This shows if your lung function is better, worse, or the same as before. Tell a  health care provider about:  Any allergies you have.  All medicines you are taking, including inhaler or nebulizer medicines, vitamins, herbs, eye drops, creams, and over-the-counter medicines.  Any blood disorders you have.  Any surgeries you have had, especially recent eye surgery, abdominal surgery, or chest surgery. These can make PFTs difficult or unsafe.  Any medical conditions you have, including chest pain or heart problems, tuberculosis, or respiratory infections such as pneumonia, a cold, or the flu.  Any fear of being in closed spaces (claustrophobia). Some of your tests may be in a closed space. What are the risks? Generally, this is a safe procedure. However, problems may occur, including:  Light-headedness due to over-breathing (hyperventilation).  An asthma attack from deep breathing.  A collapsed lung.  What happens before the procedure?  Take over-the-counter and prescription medicines only as told by your health care provider. If you take inhaler or nebulizer medicines, ask your health care provider which medicines you should take on the day of your testing. Some inhaler medicines may interfere with PFTs if they are taken shortly before the tests.  Follow your health care provider's instructions on eating and drinking restrictions. This may include avoiding eating large meals and drinking alcohol before the testing.  Do not use any products that contain nicotine or tobacco, such as cigarettes and e-cigarettes. If you need help quitting, ask your health care provider.  Wear comfortable clothing that will not interfere with breathing. What happens during the procedure?  You will be  given a soft nose clip to wear. This is done so all of your breaths will go through your mouth instead of your nose.  You will be given a germ-free (sterile) mouthpiece. It will be attached to a machine that measures your breathing (spirometer).  You will be asked to do various  breathing maneuvers. The maneuvers will be done by breathing in (inhaling) and breathing out (exhaling). You may be asked to repeat the maneuvers several times before the testing is done.  It is important to follow the instructions exactly to get accurate results. Make sure to blow as hard and as fast as you can when you are told to do so.  You may be given a medicine that makes the small air passages in your lungs larger (bronchodilator) after testing has been done. This medicine will make it easier for you to breathe.  The tests will be repeated after the bronchodilator has taken effect.  You will be monitored carefully during the procedure for faintness, dizziness, trouble breathing, or any other problems. The procedure may vary among health care providers and hospitals. What happens after the procedure?  It is up to you to get your test results. Ask your health care provider, or the department that is doing the tests, when your results will be ready. After you have received your test results, talk with your health care provider about treatment options, if necessary. Summary  Pulmonary function tests (PFTs) are used to measure how well your lungs work, find out what is causing your lung problems, and figure out the best treatment for you.  Wear comfortable clothing that will not interfere with breathing.  It is up to you to get your test results. After you have received them, talk with your health care provider about treatment options, if necessary. This information is not intended to replace advice given to you by your health care provider. Make sure you discuss any questions you have with your health care provider. Document Released: 09/18/2003 Document Revised: 12/18/2015 Document Reviewed: 12/18/2015 Elsevier Interactive Patient Education  [employer].

## 2017-06-13 ENCOUNTER — Ambulatory Visit (INDEPENDENT_AMBULATORY_CARE_PROVIDER_SITE_OTHER): Payer: Medicare Other | Admitting: *Deleted

## 2017-06-13 DIAGNOSIS — M9903 Segmental and somatic dysfunction of lumbar region: Secondary | ICD-10-CM | POA: Diagnosis not present

## 2017-06-13 DIAGNOSIS — M5116 Intervertebral disc disorders with radiculopathy, lumbar region: Secondary | ICD-10-CM | POA: Diagnosis not present

## 2017-06-13 DIAGNOSIS — I495 Sick sinus syndrome: Secondary | ICD-10-CM | POA: Diagnosis not present

## 2017-06-14 NOTE — Progress Notes (Signed)
Remote pacemaker transmission.   

## 2017-06-15 ENCOUNTER — Encounter: Payer: Self-pay | Admitting: Cardiology

## 2017-06-21 ENCOUNTER — Encounter (HOSPITAL_COMMUNITY): Payer: Medicare Other

## 2017-06-24 ENCOUNTER — Inpatient Hospital Stay (HOSPITAL_COMMUNITY): Admission: RE | Admit: 2017-06-24 | Payer: Medicare Other | Source: Ambulatory Visit

## 2017-06-27 ENCOUNTER — Other Ambulatory Visit: Payer: Self-pay | Admitting: Cardiovascular Disease

## 2017-06-27 NOTE — Telephone Encounter (Signed)
Rx has been sent to the pharmacy electronically. ° °

## 2017-06-28 ENCOUNTER — Ambulatory Visit (HOSPITAL_COMMUNITY)
Admission: RE | Admit: 2017-06-28 | Discharge: 2017-06-28 | Disposition: A | Discharge status: Home / Self Care | Payer: Medicare Other | Source: Ambulatory Visit | Attending: Cardiovascular Disease | Admitting: Cardiovascular Disease

## 2017-06-28 DIAGNOSIS — Z79899 Other long term (current) drug therapy: Secondary | ICD-10-CM | POA: Diagnosis present

## 2017-06-28 DIAGNOSIS — Z5181 Encounter for therapeutic drug level monitoring: Secondary | ICD-10-CM | POA: Diagnosis present

## 2017-06-28 DIAGNOSIS — J449 Chronic obstructive pulmonary disease, unspecified: Secondary | ICD-10-CM | POA: Insufficient documentation

## 2017-06-28 LAB — PULMONARY FUNCTION TEST
DL/VA % PRED: 91 %
DL/VA: 4.04 ml/min/mmHg/L
DLCO UNC % PRED: 67 %
DLCO unc: 13.56 ml/min/mmHg
FEF 25-75 POST: 1.54 L/s
FEF 25-75 PRE: 1.05 L/s
FEF2575-%Change-Post: 46 %
FEF2575-%PRED-POST: 136 %
FEF2575-%Pred-Pre: 92 %
FEV1-%Change-Post: 7 %
FEV1-%Pred-Post: 89 %
FEV1-%Pred-Pre: 82 %
FEV1-POST: 1.42 L
FEV1-Pre: 1.31 L
FEV1FVC-%CHANGE-POST: 0 %
FEV1FVC-%Pred-Pre: 104 %
FEV6-%Change-Post: 5 %
FEV6-%Pred-Post: 90 %
FEV6-%Pred-Pre: 85 %
FEV6-POST: 1.83 L
FEV6-Pre: 1.73 L
FEV6FVC-%CHANGE-POST: -1 %
FEV6FVC-%PRED-POST: 105 %
FEV6FVC-%PRED-PRE: 106 %
FVC-%Change-Post: 6 %
FVC-%PRED-PRE: 79 %
FVC-%Pred-Post: 85 %
FVC-PRE: 1.73 L
FVC-Post: 1.85 L
POST FEV6/FVC RATIO: 99 %
Post FEV1/FVC ratio: 77 %
Pre FEV1/FVC ratio: 76 %
Pre FEV6/FVC Ratio: 100 %
RV % pred: 81 %
RV: 1.86 L
TLC % PRED: 83 %
TLC: 3.83 L

## 2017-06-28 MED ORDER — ALBUTEROL SULFATE (2.5 MG/3ML) 0.083% IN NEBU
2.5000 mg | INHALATION_SOLUTION | Freq: Once | RESPIRATORY_TRACT | Status: AC
  Administered 2017-06-28: 2.5 mg via RESPIRATORY_TRACT

## 2017-06-30 ENCOUNTER — Encounter (HOSPITAL_COMMUNITY): Payer: Medicare Other

## 2017-07-01 LAB — CUP PACEART REMOTE DEVICE CHECK
Battery Voltage: 2.92 V
Brady Statistic AS VP Percent: 0 %
Brady Statistic RA Percent Paced: 99.61 %
Date Time Interrogation Session: 20190506212514
Implantable Lead Implant Date: 20101109
Implantable Lead Location: 753859
Implantable Lead Location: 753860
Implantable Lead Serial Number: 535369
Implantable Lead Serial Number: 657967
Implantable Pulse Generator Implant Date: 20101109
Lead Channel Impedance Value: 456 Ohm
Lead Channel Sensing Intrinsic Amplitude: 10.711 mV
Lead Channel Setting Pacing Amplitude: 2.5 V
Lead Channel Setting Sensing Sensitivity: 0.9 mV
MDC IDC LEAD IMPLANT DT: 20101109
MDC IDC MSMT LEADCHNL RA IMPEDANCE VALUE: 456 Ohm
MDC IDC MSMT LEADCHNL RA SENSING INTR AMPL: 0.795 mV
MDC IDC SET LEADCHNL RA PACING AMPLITUDE: 2 V
MDC IDC SET LEADCHNL RV PACING PULSEWIDTH: 0.4 ms
MDC IDC STAT BRADY AP VP PERCENT: 0.15 %
MDC IDC STAT BRADY AP VS PERCENT: 99.45 %
MDC IDC STAT BRADY AS VS PERCENT: 0.39 %
MDC IDC STAT BRADY RV PERCENT PACED: 0.16 %

## 2017-07-03 ENCOUNTER — Other Ambulatory Visit (HOSPITAL_COMMUNITY): Payer: Self-pay | Admitting: Nurse Practitioner

## 2017-07-05 NOTE — Telephone Encounter (Signed)
This is Dr. West Lealman's pt.  °

## 2017-07-05 NOTE — Telephone Encounter (Signed)
Rx sent to pharmacy   

## 2017-07-11 ENCOUNTER — Other Ambulatory Visit (HOSPITAL_COMMUNITY): Payer: Self-pay | Admitting: Nurse Practitioner

## 2017-07-12 NOTE — Telephone Encounter (Signed)
Rx sent to pharmacy   

## 2017-07-13 ENCOUNTER — Ambulatory Visit: Payer: Medicare Other | Admitting: Internal Medicine

## 2017-07-13 ENCOUNTER — Encounter: Payer: Self-pay | Admitting: Internal Medicine

## 2017-07-13 VITALS — BP 120/70 | HR 68 | Ht 60.5 in | Wt 127.0 lb

## 2017-07-13 DIAGNOSIS — Z86010 Personal history of colonic polyps: Secondary | ICD-10-CM | POA: Diagnosis not present

## 2017-07-13 DIAGNOSIS — R194 Change in bowel habit: Secondary | ICD-10-CM

## 2017-07-13 DIAGNOSIS — K5900 Constipation, unspecified: Secondary | ICD-10-CM

## 2017-07-13 NOTE — Patient Instructions (Signed)
Please follow up as needed 

## 2017-07-13 NOTE — Progress Notes (Signed)
HISTORY OF PRESENT ILLNESS:  Kathryn Gibson is a 84 y.o. female with past medical history as listed below including atrial fibrillation on chronic anticoagulation therapy and has been followed in this office for chronic constipation, bloating, and adenomatous colon polyps. Also complaints of rectal fullness or pressure. She was last seen in this office 11/12/2016. See that dictation. She has tried linzess for constipation. This did not work. She tried Amitiza. This did not work. She has had the most excess with MiraLAX. Has been adjusting the dosage. Currently with 2 overflowing capfuls. Recently initiated magnesium to see if this would help. This resulted in loose stools. She is concerned. She "wants checked out". She is afraid of colon cancer. Patient has had multiple prior colonoscopies. Most recent examinations 2006, 2013, and 2016. Last examination was severe left-sided diverticulosis and internal hemorrhoids. The patient's weight has been stable. She tells me occasionally she will gain a pound or 2. She still has fullness and discomfort associated with constipation. Better when she defecates. She has varying degrees of bloating and belching. No melena or hematochezia. She does take probiotic align. Her weight 3 years ago was 123 pounds. Last fall 128 pounds. Today 127 pounds. Review of outside blood work from [place] shows unremarkable CBC and TSH.  REVIEW OF SYSTEMS:  All non-GI ROS negative except for arthritis, occasional ankle swelling  Past Medical History:  Diagnosis Date  . Arthritis   . Atrial fibrillation ([place])   . Bradycardia    s/p PPM  . Cataract   . Colonic polyp    adenomatous  . Constipation   . Diverticulosis   . Hemorrhoids   . Hypertension   . Hypothyroidism   . Irritable bowel syndrome (IBS)   . Pacemaker 2010    Past Surgical History:  Procedure Laterality Date  . BREAST CYST ASPIRATION Left 1980s  . COLONOSCOPY    . ELECTROPHYSIOLOGIC STUDY N/A  07/01/2015   Procedure: Atrial Fibrillation Ablation;  Surgeon: Thompson Grayer, MD;  Location: [place] [facility];  Service: Cardiovascular;  Laterality: N/A;  . EYE SURGERY    . HEMATOMA EVACUATION Right 07/15/2014   Procedure: EVACUATION Right Groin Hematoma;  Surgeon: Rosetta Posner, MD;  Location: [place];  Service: Vascular;  Laterality: Right;  . NASAL ENDOSCOPY WITH EPISTAXIS CONTROL N/A 07/14/2014   Procedure: NASAL ENDOSCOPY WITH EPISTAXIS CONTROL;  Surgeon: Ruby Cola, MD;  Location: [place];  Service: ENT;  Laterality: N/A;  . NASAL ENDOSCOPY WITH EPISTAXIS CONTROL Bilateral 07/15/2014   Procedure: NASAL ENDOSCOPY WITH EPISTAXIS CONTROL;  Surgeon: Ruby Cola, MD;  Location: [facility] [place];  Service: ENT;  Laterality: Bilateral;  . PACEMAKER INSERTION  2010   implanted by Dr Doreatha Lew ([facility])  . RADIOLOGY WITH ANESTHESIA N/A 07/14/2014   Procedure: RADIOLOGY WITH ANESTHESIA;  Surgeon: Medication Radiologist, MD;  Location: [place];  Service: Radiology;  Laterality: N/A;  . TEE WITHOUT CARDIOVERSION N/A 07/01/2015   Procedure: TRANSESOPHAGEAL ECHOCARDIOGRAM (TEE);  Surgeon: Sueanne Margarita, MD;  Location: [facility] ENDOSCOPY;  Service: Cardiovascular;  Laterality: N/A;    Social History Kathryn Gibson  reports that she quit smoking about 37 years ago. Her smoking use included cigarettes. She has never used smokeless tobacco. She reports that she drinks about 8.4 oz of alcohol per week. She reports that she does not use drugs.  family history includes CAD in her brother; Congestive Heart Failure in her mother; Dementia in her mother; Diabetes in her paternal grandfather; Heart attack (age of onset:  46) in her father; Hypertension in her mother; Leukemia in her daughter; Lung cancer in her son.  No Known Allergies     PHYSICAL EXAMINATION: Vital signs: BP 120/70   Pulse 68   Ht 5' 0.5" (1.537 m)   Wt 127 lb (57.6 kg)   BMI 24.39 kg/m   Constitutional: pleasant,generally well-appearing, no acute  distress Psychiatric: alert and oriented x3, cooperative Eyes: extraocular movements intact, anicteric, conjunctiva pink Mouth: oral pharynx moist, no lesions Neck: supple no lymphadenopathy Cardiovascular: heart regular rate and rhythm, no murmur Lungs: clear to auscultation bilaterally Abdomen: soft, nontender, nondistended, no obvious ascites, no peritoneal signs, normal bowel sounds, no organomegaly Rectal:omitted Extremities: no clubbing, cyanosis, or lower extremity edema bilaterally Skin: no lesions on visible extremities Neuro: No focal deficits. Cranial nerves intact  ASSESSMENT:  #1. Constipation predominant irritable bowel syndrome. Ongoing .recent increased loose bowel secondary to the addition of magnesium #2. History of adenomatous colon polyps. Last colonoscopy 2016 with diverticulosis #3. Multiple medical problems. Stable   PLAN:  #1. Continue MiraLAX. We discussed titration strategy #2. Recommended adding fiber supplementation such as Metamucil to give the bowel a more correctable consistency #3. I discussed with her magnesium can cause diarrhea #4. Discussed with her most recent colonoscopy report #5. Reassured her that she does not have colon cancer accounting for these chronic symptoms #6. Resume general medical care with PCP. GI follow-up as needed

## 2017-07-14 ENCOUNTER — Ambulatory Visit (INDEPENDENT_AMBULATORY_CARE_PROVIDER_SITE_OTHER): Payer: Medicare Other | Admitting: *Deleted

## 2017-07-14 DIAGNOSIS — I495 Sick sinus syndrome: Secondary | ICD-10-CM

## 2017-07-14 NOTE — Progress Notes (Signed)
Remote pacemaker transmission.   

## 2017-08-04 ENCOUNTER — Other Ambulatory Visit: Payer: Self-pay | Admitting: Cardiovascular Disease

## 2017-08-09 DIAGNOSIS — M9903 Segmental and somatic dysfunction of lumbar region: Secondary | ICD-10-CM | POA: Diagnosis not present

## 2017-08-09 DIAGNOSIS — M5116 Intervertebral disc disorders with radiculopathy, lumbar region: Secondary | ICD-10-CM | POA: Diagnosis not present

## 2017-08-30 ENCOUNTER — Other Ambulatory Visit: Payer: Self-pay | Admitting: Obstetrics and Gynecology

## 2017-08-30 DIAGNOSIS — Z1231 Encounter for screening mammogram for malignant neoplasm of breast: Secondary | ICD-10-CM

## 2017-08-31 LAB — CUP PACEART REMOTE DEVICE CHECK
Battery Voltage: 2.92 V
Brady Statistic AP VS Percent: 99.12 %
Brady Statistic RA Percent Paced: 99.17 %
Date Time Interrogation Session: 20190606131334
Implantable Lead Implant Date: 20101109
Implantable Lead Implant Date: 20101109
Implantable Lead Location: 753860
Implantable Lead Model: 4469
Implantable Lead Serial Number: 535369
Implantable Pulse Generator Implant Date: 20101109
Lead Channel Impedance Value: 448 Ohm
Lead Channel Sensing Intrinsic Amplitude: 10.042 mV
Lead Channel Setting Pacing Pulse Width: 0.4 ms
Lead Channel Setting Sensing Sensitivity: 0.9 mV
MDC IDC LEAD LOCATION: 753859
MDC IDC LEAD SERIAL: 657967
MDC IDC MSMT LEADCHNL RA SENSING INTR AMPL: 0.839 mV
MDC IDC MSMT LEADCHNL RV IMPEDANCE VALUE: 448 Ohm
MDC IDC SET LEADCHNL RA PACING AMPLITUDE: 2 V
MDC IDC SET LEADCHNL RV PACING AMPLITUDE: 2.5 V
MDC IDC STAT BRADY AP VP PERCENT: 0.05 %
MDC IDC STAT BRADY AS VP PERCENT: 0.01 %
MDC IDC STAT BRADY AS VS PERCENT: 0.82 %
MDC IDC STAT BRADY RV PERCENT PACED: 0.06 %

## 2017-09-07 ENCOUNTER — Ambulatory Visit: Payer: Medicare Other | Admitting: Internal Medicine

## 2017-09-07 ENCOUNTER — Encounter: Payer: Self-pay | Admitting: Internal Medicine

## 2017-09-07 VITALS — BP 120/68 | HR 63 | Ht 61.0 in | Wt 130.8 lb

## 2017-09-07 DIAGNOSIS — I480 Paroxysmal atrial fibrillation: Secondary | ICD-10-CM | POA: Diagnosis not present

## 2017-09-07 DIAGNOSIS — I495 Sick sinus syndrome: Secondary | ICD-10-CM

## 2017-09-07 DIAGNOSIS — I10 Essential (primary) hypertension: Secondary | ICD-10-CM | POA: Diagnosis not present

## 2017-09-07 MED ORDER — METOPROLOL SUCCINATE ER 50 MG PO TB24: 50.0000 mg | ORAL_TABLET | Freq: Every day | ORAL | 3 refills | Status: DC

## 2017-09-07 NOTE — Progress Notes (Signed)
PCP: Prince Solian, MD Primary Cardiologist: Dr Oval Linsey Primary EP:  Dr Rayann Heman  Kathryn Gibson is a 84 y.o. female who presents today for routine electrophysiology followup.  Since last being seen in our clinic, the patient reports doing very well.  She is very active.  Exercises daily and swims 6 days per week in addition! Her concern today is with weight gain.  She worries about her metabolism. She has stable venous insufficiency.  Today, she denies symptoms of palpitations, chest pain, shortness of breath,  l dizziness, presyncope, or syncope.  The patient is otherwise without complaint today.   Past Medical History:  Diagnosis Date  . Arthritis   . Atrial fibrillation ([place])   . Bradycardia    s/p PPM  . Cataract   . Colonic polyp    adenomatous  . Constipation   . Diverticulosis   . Hemorrhoids   . Hypertension   . Hypothyroidism   . Irritable bowel syndrome (IBS)   . Pacemaker 2010   Past Surgical History:  Procedure Laterality Date  . BREAST CYST ASPIRATION Left 1980s  . COLONOSCOPY    . ELECTROPHYSIOLOGIC STUDY N/A 07/01/2015   Procedure: Atrial Fibrillation Ablation;  Surgeon: Thompson Grayer, MD;  Location: [place] [facility];  Service: Cardiovascular;  Laterality: N/A;  . EYE SURGERY    . HEMATOMA EVACUATION Right 07/15/2014   Procedure: EVACUATION Right Groin Hematoma;  Surgeon: Rosetta Posner, MD;  Location: [place];  Service: Vascular;  Laterality: Right;  . NASAL ENDOSCOPY WITH EPISTAXIS CONTROL N/A 07/14/2014   Procedure: NASAL ENDOSCOPY WITH EPISTAXIS CONTROL;  Surgeon: Ruby Cola, MD;  Location: [place];  Service: ENT;  Laterality: N/A;  . NASAL ENDOSCOPY WITH EPISTAXIS CONTROL Bilateral 07/15/2014   Procedure: NASAL ENDOSCOPY WITH EPISTAXIS CONTROL;  Surgeon: Ruby Cola, MD;  Location: [place];  Service: ENT;  Laterality: Bilateral;  . PACEMAKER INSERTION  2010   implanted by Dr Doreatha Lew (MDT)  . RADIOLOGY WITH ANESTHESIA N/A 07/14/2014   Procedure: RADIOLOGY  WITH ANESTHESIA;  Surgeon: Medication Radiologist, MD;  Location: [place];  Service: Radiology;  Laterality: N/A;  . TEE WITHOUT CARDIOVERSION N/A 07/01/2015   Procedure: TRANSESOPHAGEAL ECHOCARDIOGRAM (TEE);  Surgeon: Sueanne Margarita, MD;  Location: [facility];  Service: Cardiovascular;  Laterality: N/A;    ROS- all systems are reviewed and negative except as per HPI above  Current Outpatient Medications  Medication Sig Dispense Refill  . acetaminophen (TYLENOL) 500 MG tablet Take 500-1,000 mg by mouth every 6 (six) hours as needed for moderate pain or headache.    Marland Kitchen amiodarone (PACERONE) 200 MG tablet TAKE ONE-HALF TABLET BY MOUTH ONCE DAILY 45 tablet 0  . Ascorbic Acid (VITAMIN C) 1000 MG tablet Take 1,000 mg by mouth daily.      . Calcium-Vitamin D (CALTRATE 600 PLUS-VIT D PO) Take 1 tablet by mouth daily.    . cholecalciferol (VITAMIN D) 1000 UNITS tablet Take 1,000 Units by mouth daily.      Marland Kitchen diltiazem (CARDIZEM CD) 360 MG 24 hr capsule TAKE 1 CAPSULE BY MOUTH ONCE DAILY 30 capsule 6  . LYSINE PO Take 1-2 tablets by mouth daily as needed (fever blister).    . Magnesium 250 MG TABS Take by mouth daily.    . metoprolol succinate (TOPROL-XL) 50 MG 24 hr tablet TAKE 1 TABLET BY MOUTH TWICE DAILY. TAKE WITH OR  IMMEDIATELY FOLLOWING A MEAL 60 tablet 3  . Multiple Vitamins-Minerals (ICAPS AREDS 2 PO) Take by  mouth daily.    . Omega-3 Fatty Acids (FISH OIL) 1200 MG CAPS Take 1 capsule by mouth daily. Mega Red.    . polyethylene glycol powder (GLYCOLAX/MIRALAX) powder Take 2 capfuls by mouth daily  0  . Probiotic Product (ALIGN PO) Take 1 capsule by mouth daily.     . rosuvastatin (CRESTOR) 10 MG tablet TAKE 1 TABLET BY MOUTH ONCE DAILY 90 tablet 1  . SYNTHROID 50 MCG tablet as directed.  7  . SYNTHROID 75 MCG tablet Take 50-75 mcg by mouth daily before breakfast. Alternates between 50 mcg and 75 mcg daily  2  . temazepam (RESTORIL) 30 MG capsule Take 30 mg by mouth at bedtime.     Alveda Reasons  20 MG TABS tablet TAKE 1 TABLET BY MOUTH ONCE DAILY WITH SUPPER 30 tablet 11   No current facility-administered medications for this visit.     Physical Exam: Vitals:   09/07/17 1415  BP: 120/68  Pulse: 63  SpO2: 97%  Weight: 130 lb 12.8 oz (59.3 kg)  Height: 5\' 1"  (1.549 m)    GEN- The patient is well appearing, alert and oriented x 3 today.   Head- normocephalic, atraumatic Eyes-  Sclera clear, conjunctiva pink Ears- hearing intact Oropharynx- clear Lungs- Clear to ausculation bilaterally, normal work of breathing Chest- pacemaker pocket is well healed Heart- Regular rate and rhythm, no murmurs, rubs or gallops, PMI not laterally displaced GI- soft, NT, ND, + BS Extremities- no clubbing, cyanosis, trace edema  Pacemaker interrogation- reviewed in detail today,  See PACEART report   Assessment and Plan:  1. Symptomatic sinus bradycardia  Normal pacemaker function See Pace Art report No changes today  2. afib Well controlled 0% afib since 03/02/17 On xarelto and low dose amiodarone Labs [facility] Reduce toprol XL to 50mg  daily given concerns of her metabolism change Could consider reducing diltiazem upon return (which may help with her venous insufficiency)  3. HTN Stable No change required today   [facility] Return to see me in 6 months  Thompson Grayer MD, [facility] 09/07/2017 2:46 PM

## 2017-09-07 NOTE — Patient Instructions (Addendum)
Medication Instructions:  Your physician has recommended you make the following change in your medication:  1. DECREASE Toprol to 50 mg daily  *If you need a refill on your cardiac medications before your next appointment, please call your pharmacy*  Labwork: None ordered  Testing/Procedures: None ordered  Follow-Up: Remote monitoring is used to monitor your Pacemaker or ICD from home. This monitoring reduces the number of office visits required to check your device to one time per year. It allows [place] to keep an eye on the functioning of your device to ensure it is working properly. You are scheduled for a device check from home on 10/13/2017. You may send your transmission at any time that day. If you have a wireless device, the transmission will be sent automatically. After your physician reviews your transmission, you will receive a postcard with your next transmission date.  Your physician wants you to follow-up in: 6 months with Dr. Rayann Heman.  You will receive a reminder letter in the mail two months in advance. If you don't receive a letter, please call our office to schedule the follow-up appointment.  Thank you for choosing [facility]!!    Any Other Special Instructions Will Be Listed Below (If Applicable).  Please follow a 2000 mg, or less, sodium diet  Low-Sodium Eating Plan Sodium, which is an element that makes up salt, helps you maintain a healthy balance of fluids in your body. Too much sodium can increase your blood pressure and cause fluid and waste to be held in your body. Your health care provider or dietitian may recommend following this plan if you have high blood pressure (hypertension), kidney disease, liver disease, or heart failure. Eating less sodium can help lower your blood pressure, reduce swelling, and protect your heart, liver, and kidneys. What are tips for following this plan? General guidelines  Most people on this plan should limit their sodium intake  to 1,500-2,000 mg (milligrams) of sodium each day. Reading food labels  The Nutrition Facts label lists the amount of sodium in one serving of the food. If you eat more than one serving, you must multiply the listed amount of sodium by the number of servings.  Choose foods with less than 140 mg of sodium per serving.  Avoid foods with 300 mg of sodium or more per serving. Shopping  Look for lower-sodium products, often labeled as "low-sodium" or "no salt added."  Always check the sodium content even if foods are labeled as "unsalted" or "no salt added".  Buy fresh foods. ? Avoid canned foods and premade or frozen meals. ? Avoid canned, cured, or processed meats  Buy breads that have less than 80 mg of sodium per slice. Cooking  Eat more home-cooked food and less restaurant, buffet, and fast food.  Avoid adding salt when cooking. Use salt-free seasonings or herbs instead of table salt or sea salt. Check with your health care provider or pharmacist before using salt substitutes.  Cook with plant-based oils, such as canola, sunflower, or olive oil. Meal planning  When eating at a restaurant, ask that your food be prepared with less salt or no salt, if possible.  Avoid foods that contain MSG (monosodium glutamate). MSG is sometimes added to [place] food, bouillon, and some canned foods. What foods are recommended? The items listed may not be a complete list. Talk with your dietitian about what dietary choices are best for you. Grains Low-sodium cereals, including oats, puffed wheat and rice, and shredded wheat. Low-sodium crackers. Unsalted  rice. Unsalted pasta. Low-sodium bread. Whole-grain breads and whole-grain pasta. Vegetables Fresh or frozen vegetables. "No salt added" canned vegetables. "No salt added" tomato sauce and paste. Low-sodium or reduced-sodium tomato and vegetable juice. Fruits Fresh, frozen, or canned fruit. Fruit juice. Meats and other protein foods Fresh or  frozen (no salt added) meat, poultry, seafood, and fish. Low-sodium canned tuna and salmon. Unsalted nuts. Dried peas, beans, and lentils without added salt. Unsalted canned beans. Eggs. Unsalted nut butters. Dairy Milk. Soy milk. Cheese that is naturally low in sodium, such as ricotta cheese, fresh mozzarella, or Swiss cheese Low-sodium or reduced-sodium cheese. Cream cheese. Yogurt. Fats and oils Unsalted butter. Unsalted margarine with no trans fat. Vegetable oils such as canola or olive oils. Seasonings and other foods Fresh and dried herbs and spices. Salt-free seasonings. Low-sodium mustard and ketchup. Sodium-free salad dressing. Sodium-free light mayonnaise. Fresh or refrigerated horseradish. Lemon juice. Vinegar. Homemade, reduced-sodium, or low-sodium soups. Unsalted popcorn and pretzels. Low-salt or salt-free chips. What foods are not recommended? The items listed may not be a complete list. Talk with your dietitian about what dietary choices are best for you. Grains Instant hot cereals. Bread stuffing, pancake, and biscuit mixes. Croutons. Seasoned rice or pasta mixes. Noodle soup cups. Boxed or frozen macaroni and cheese. Regular salted crackers. Self-rising flour. Vegetables Sauerkraut, pickled vegetables, and relishes. Olives. [place] fries. Onion rings. Regular canned vegetables (not low-sodium or reduced-sodium). Regular canned tomato sauce and paste (not low-sodium or reduced-sodium). Regular tomato and vegetable juice (not low-sodium or reduced-sodium). Frozen vegetables in sauces. Meats and other protein foods Meat or fish that is salted, canned, smoked, spiced, or pickled. Bacon, ham, sausage, hotdogs, corned beef, chipped beef, packaged lunch meats, salt pork, jerky, pickled herring, anchovies, regular canned tuna, sardines, salted nuts. Dairy Processed cheese and cheese spreads. Cheese curds. Blue cheese. Feta cheese. String cheese. Regular cottage cheese. Buttermilk. Canned  milk. Fats and oils Salted butter. Regular margarine. Ghee. Bacon fat. Seasonings and other foods Onion salt, garlic salt, seasoned salt, table salt, and sea salt. Canned and packaged gravies. Worcestershire sauce. Tartar sauce. Barbecue sauce. Teriyaki sauce. Soy sauce, including reduced-sodium. Steak sauce. Fish sauce. Oyster sauce. Cocktail sauce. Horseradish that you find on the shelf. Regular ketchup and mustard. Meat flavorings and tenderizers. Bouillon cubes. Hot sauce and Tabasco sauce. Premade or packaged marinades. Premade or packaged taco seasonings. Relishes. Regular salad dressings. Salsa. Potato and tortilla chips. Corn chips and puffs. Salted popcorn and pretzels. Canned or dried soups. Pizza. Frozen entrees and pot pies. Summary  Eating less sodium can help lower your blood pressure, reduce swelling, and protect your heart, liver, and kidneys.  Most people on this plan should limit their sodium intake to 1,500-2,000 mg (milligrams) of sodium each day.  Canned, boxed, and frozen foods are high in sodium. Restaurant foods, fast foods, and pizza are also very high in sodium. You also get sodium by adding salt to food.  Try to cook at home, eat more fresh fruits and vegetables, and eat less fast food, canned, processed, or prepared foods. This information is not intended to replace advice given to you by your health care provider. Make sure you discuss any questions you have with your health care provider. Document Released: 07/17/2001 Document Revised: 01/19/2016 Document Reviewed: 01/19/2016 Elsevier Interactive Patient Education  [employer].

## 2017-09-08 LAB — CUP PACEART INCLINIC DEVICE CHECK
Battery Voltage: 2.9 V
Brady Statistic AS VP Percent: 0 %
Brady Statistic AS VS Percent: 0.58 %
Brady Statistic RA Percent Paced: 99.41 %
Brady Statistic RV Percent Paced: 0.09 %
Date Time Interrogation Session: 20190731183218
Implantable Lead Implant Date: 20101109
Implantable Lead Implant Date: 20101109
Implantable Lead Location: 753859
Implantable Lead Location: 753860
Implantable Lead Serial Number: 535369
Implantable Pulse Generator Implant Date: 20101109
Lead Channel Impedance Value: 448 Ohm
Lead Channel Impedance Value: 456 Ohm
Lead Channel Setting Pacing Amplitude: 2 V
Lead Channel Setting Pacing Amplitude: 2.5 V
Lead Channel Setting Sensing Sensitivity: 0.9 mV
MDC IDC LEAD SERIAL: 657967
MDC IDC MSMT LEADCHNL RV SENSING INTR AMPL: 10.711 mV
MDC IDC SET LEADCHNL RV PACING PULSEWIDTH: 0.4 ms
MDC IDC STAT BRADY AP VP PERCENT: 0.09 %
MDC IDC STAT BRADY AP VS PERCENT: 99.33 %

## 2017-09-21 DIAGNOSIS — I480 Paroxysmal atrial fibrillation: Secondary | ICD-10-CM | POA: Diagnosis not present

## 2017-09-21 DIAGNOSIS — I2510 Atherosclerotic heart disease of native coronary artery without angina pectoris: Secondary | ICD-10-CM | POA: Diagnosis not present

## 2017-09-21 DIAGNOSIS — I10 Essential (primary) hypertension: Secondary | ICD-10-CM | POA: Diagnosis not present

## 2017-09-21 DIAGNOSIS — E7849 Other hyperlipidemia: Secondary | ICD-10-CM | POA: Diagnosis not present

## 2017-09-22 ENCOUNTER — Ambulatory Visit: Payer: Medicare Other

## 2017-09-26 ENCOUNTER — Telehealth: Payer: Self-pay | Admitting: Cardiovascular Disease

## 2017-09-26 NOTE — Telephone Encounter (Signed)
Received records from [facility]on 09/26/17, Appt 11/18/17 @ 1:20PM. NV

## 2017-09-28 DIAGNOSIS — M859 Disorder of bone density and structure, unspecified: Secondary | ICD-10-CM | POA: Diagnosis not present

## 2017-10-03 DIAGNOSIS — M5116 Intervertebral disc disorders with radiculopathy, lumbar region: Secondary | ICD-10-CM | POA: Diagnosis not present

## 2017-10-03 DIAGNOSIS — M9903 Segmental and somatic dysfunction of lumbar region: Secondary | ICD-10-CM | POA: Diagnosis not present

## 2017-10-12 ENCOUNTER — Ambulatory Visit
Admission: RE | Admit: 2017-10-12 | Discharge: 2017-10-12 | Disposition: A | Discharge status: Home / Self Care | Payer: Medicare Other | Source: Ambulatory Visit | Attending: Obstetrics and Gynecology | Admitting: Obstetrics and Gynecology

## 2017-10-12 DIAGNOSIS — Z1231 Encounter for screening mammogram for malignant neoplasm of breast: Secondary | ICD-10-CM

## 2017-10-13 ENCOUNTER — Ambulatory Visit (INDEPENDENT_AMBULATORY_CARE_PROVIDER_SITE_OTHER): Payer: Medicare Other | Admitting: *Deleted

## 2017-10-13 DIAGNOSIS — I495 Sick sinus syndrome: Secondary | ICD-10-CM

## 2017-10-13 NOTE — Progress Notes (Signed)
Remote pacemaker transmission.   

## 2017-10-13 NOTE — Addendum Note (Signed)
Addended by: Douglass Rivers D on: 10/13/2017 10:13 AM   Modules accepted: Level of Service

## 2017-10-26 DIAGNOSIS — D1801 Hemangioma of skin and subcutaneous tissue: Secondary | ICD-10-CM | POA: Diagnosis not present

## 2017-10-26 DIAGNOSIS — L820 Inflamed seborrheic keratosis: Secondary | ICD-10-CM | POA: Diagnosis not present

## 2017-10-26 DIAGNOSIS — L821 Other seborrheic keratosis: Secondary | ICD-10-CM | POA: Diagnosis not present

## 2017-11-08 LAB — CUP PACEART REMOTE DEVICE CHECK
Brady Statistic AP VP Percent: 0 %
Brady Statistic AS VP Percent: 0 %
Brady Statistic AS VS Percent: 1.7 %
Implantable Lead Implant Date: 20101109
Implantable Lead Location: 753859
Implantable Lead Model: 4470
Implantable Lead Serial Number: 657967
Implantable Pulse Generator Implant Date: 20101109
Lead Channel Impedance Value: 448 Ohm
Lead Channel Impedance Value: 456 Ohm
Lead Channel Sensing Intrinsic Amplitude: 0.751 mV
Lead Channel Setting Pacing Amplitude: 2.5 V
Lead Channel Setting Pacing Pulse Width: 0.4 ms
MDC IDC LEAD IMPLANT DT: 20101109
MDC IDC LEAD LOCATION: 753860
MDC IDC LEAD SERIAL: 535369
MDC IDC MSMT BATTERY VOLTAGE: 2.9 V
MDC IDC MSMT LEADCHNL RV SENSING INTR AMPL: 9.372 mV
MDC IDC SESS DTM: 20190905122102
MDC IDC SET LEADCHNL RA PACING AMPLITUDE: 2 V
MDC IDC SET LEADCHNL RV SENSING SENSITIVITY: 0.9 mV
MDC IDC STAT BRADY AP VS PERCENT: 98.3 %
MDC IDC STAT BRADY RA PERCENT PACED: 98.29 %
MDC IDC STAT BRADY RV PERCENT PACED: 0 %

## 2017-11-10 DIAGNOSIS — M5116 Intervertebral disc disorders with radiculopathy, lumbar region: Secondary | ICD-10-CM | POA: Diagnosis not present

## 2017-11-10 DIAGNOSIS — M9903 Segmental and somatic dysfunction of lumbar region: Secondary | ICD-10-CM | POA: Diagnosis not present

## 2017-11-12 DIAGNOSIS — Z23 Encounter for immunization: Secondary | ICD-10-CM | POA: Diagnosis not present

## 2017-11-14 ENCOUNTER — Other Ambulatory Visit: Payer: Self-pay | Admitting: Cardiovascular Disease

## 2017-11-15 ENCOUNTER — Ambulatory Visit (INDEPENDENT_AMBULATORY_CARE_PROVIDER_SITE_OTHER): Payer: Medicare Other | Admitting: *Deleted

## 2017-11-15 DIAGNOSIS — R002 Palpitations: Secondary | ICD-10-CM

## 2017-11-15 DIAGNOSIS — I495 Sick sinus syndrome: Secondary | ICD-10-CM

## 2017-11-16 NOTE — Progress Notes (Signed)
Remote pacemaker transmission.   

## 2017-11-18 ENCOUNTER — Ambulatory Visit: Payer: Medicare Other | Admitting: Cardiovascular Disease

## 2017-11-18 ENCOUNTER — Encounter: Payer: Self-pay | Admitting: Cardiovascular Disease

## 2017-11-18 VITALS — BP 120/72 | HR 62 | Ht 61.0 in | Wt 127.8 lb

## 2017-11-18 DIAGNOSIS — I480 Paroxysmal atrial fibrillation: Secondary | ICD-10-CM

## 2017-11-18 DIAGNOSIS — E7800 Pure hypercholesterolemia, unspecified: Secondary | ICD-10-CM

## 2017-11-18 DIAGNOSIS — I2510 Atherosclerotic heart disease of native coronary artery without angina pectoris: Secondary | ICD-10-CM | POA: Diagnosis not present

## 2017-11-18 NOTE — Patient Instructions (Signed)
Medication Instructions:  Your physician recommends that you continue on your current medications as directed. Please refer to the Current Medication list given to you today.  If you need a refill on your cardiac medications before your next appointment, please call your pharmacy.   Lab work: NONE  Testing/Procedures: NONE  Follow-Up: At [facility], you and your health needs are our priority.  As part of our continuing mission to provide you with exceptional heart care, we have created designated Provider Care Teams.  These Care Teams include your primary Cardiologist (physician) and Advanced Practice Providers (APPs -  Physician Assistants and Nurse Practitioners) who all work together to provide you with the care you need, when you need it. You will need a follow up appointment in 12 months.  Please call our office 2 months in advance to schedule this appointment.  DR [facility]  or one of the following Advanced Practice Providers on your designated Care Team:   Kerin Ransom, PA-C [place], [place] . Sande Rives, PA-C

## 2017-11-18 NOTE — Progress Notes (Addendum)
Cardiology Office Note   Date:  11/18/2017   ID:  Kathryn Gibson, DOB 11-15-1933, MRN [id number]  PCP:  Kathryn Solian, MD  Cardiologist:   [name], MD  Electrophysiologist: Thompson Grayer  No chief complaint on file.    History of Present Illness: Kathryn Gibson is a 84 y.o. female with atrial fibrillation s/p ablation, SSS s/p PPM, asymptomatic coronary calcifications, and  hypertension, who presents for follow-up.  Ms. Kathryn Gibson  was previously a patient of Dr. Mare Ferrari.  She underwent ablation for atrial fibrillation on 07/01/15.  She last had an episode of atrial fibrillation 12/2015.  She has been doing well and rarely has palpitations that don't last very long.  She is nearing ERI and her PPM batter is followed monthly.  She continues to exercise nearly every day per week.  She has no exertional symptoms.  She has mild edema attributable to varicose veins that is better when she uses compression stockings.  She has no orthopnea or PND.  She last saw Dr. Rayann Heman 09/07/17 and was doing well.  Device interrogation showed no recent episodes of atrial fibrillation since 03/02/2017.  Metoprolol was reduced to 50 mg due to concern for metabolism change.  She continues to notice that her metabolism is slower than in the past.  She struggles to lose weight even though she is very physically active and does not eat much.  Since reducing metoprolol she has not had any recurrent palpitations.  She denies any chest pain or shortness of breath.  She does have some swelling in her ankles that she attributes to her poor vein flow.  She wears compression socks which helps.  It is worse after she sits all day and always better when she gets up in the morning or after she is swims in the pool.  She has no orthopnea or PND.   Past Medical History:  Diagnosis Date  . Arthritis   . Atrial fibrillation ([place])   . Bradycardia    s/p PPM  . Cataract   . Colonic polyp    adenomatous  .  Constipation   . Diverticulosis   . Hemorrhoids   . Hypertension   . Hypothyroidism   . Irritable bowel syndrome (IBS)   . Pacemaker 2010    Past Surgical History:  Procedure Laterality Date  . BREAST CYST ASPIRATION Left 1980s  . COLONOSCOPY    . ELECTROPHYSIOLOGIC STUDY N/A 07/01/2015   Procedure: Atrial Fibrillation Ablation;  Surgeon: Thompson Grayer, MD;  Location: [place] [facility];  Service: Cardiovascular;  Laterality: N/A;  . EYE SURGERY    . HEMATOMA EVACUATION Right 07/15/2014   Procedure: EVACUATION Right Groin Hematoma;  Surgeon: Rosetta Posner, MD;  Location: [place];  Service: Vascular;  Laterality: Right;  . NASAL ENDOSCOPY WITH EPISTAXIS CONTROL N/A 07/14/2014   Procedure: NASAL ENDOSCOPY WITH EPISTAXIS CONTROL;  Surgeon: Ruby Cola, MD;  Location: [place];  Service: ENT;  Laterality: N/A;  . NASAL ENDOSCOPY WITH EPISTAXIS CONTROL Bilateral 07/15/2014   Procedure: NASAL ENDOSCOPY WITH EPISTAXIS CONTROL;  Surgeon: Ruby Cola, MD;  Location: [place];  Service: ENT;  Laterality: Bilateral;  . PACEMAKER INSERTION  2010   implanted by Dr Doreatha Lew (MDT)  . RADIOLOGY WITH ANESTHESIA N/A 07/14/2014   Procedure: RADIOLOGY WITH ANESTHESIA;  Surgeon: Medication Radiologist, MD;  Location: [place];  Service: Radiology;  Laterality: N/A;  . TEE WITHOUT CARDIOVERSION N/A 07/01/2015   Procedure: TRANSESOPHAGEAL ECHOCARDIOGRAM (TEE);  Surgeon: Sueanne Margarita,  MD;  Location: [place] ENDOSCOPY;  Service: Cardiovascular;  Laterality: N/A;     Current Outpatient Medications  Medication Sig Dispense Refill  . acetaminophen (TYLENOL) 500 MG tablet Take 500-1,000 mg by mouth every 6 (six) hours as needed for moderate pain or headache.    Marland Kitchen amiodarone (PACERONE) 200 MG tablet TAKE ONE-HALF TABLET BY MOUTH ONCE DAILY 45 tablet 0  . Ascorbic Acid (VITAMIN C) 1000 MG tablet Take 1,000 mg by mouth daily.      . Calcium-Vitamin D (CALTRATE 600 PLUS-VIT D PO) Take 1 tablet by mouth daily.    . cholecalciferol  (VITAMIN D) 1000 UNITS tablet Take 1,000 Units by mouth daily.      Marland Kitchen diltiazem (CARDIZEM CD) 360 MG 24 hr capsule TAKE 1 CAPSULE BY MOUTH ONCE DAILY 30 capsule 6  . LYSINE PO Take 1-2 tablets by mouth daily as needed (fever blister).    . Magnesium 250 MG TABS Take by mouth daily.    . metoprolol succinate (TOPROL-XL) 50 MG 24 hr tablet Take 1 tablet (50 mg total) by mouth daily. Take with or immediately following a meal. 90 tablet 3  . Multiple Vitamins-Minerals (ICAPS AREDS 2 PO) Take by mouth daily.    . Omega-3 Fatty Acids (FISH OIL) 1200 MG CAPS Take 1 capsule by mouth daily. Mega Red.    . polyethylene glycol powder (GLYCOLAX/MIRALAX) powder Take 2 capfuls by mouth daily  0  . Probiotic Product (ALIGN PO) Take 1 capsule by mouth daily.     . rosuvastatin (CRESTOR) 10 MG tablet TAKE 1 TABLET BY MOUTH ONCE DAILY 90 tablet 1  . SYNTHROID 50 MCG tablet as directed.  7  . SYNTHROID 75 MCG tablet Take 50-75 mcg by mouth daily before breakfast. Alternates between 50 mcg and 75 mcg daily  2  . temazepam (RESTORIL) 30 MG capsule Take 30 mg by mouth at bedtime.     Alveda Reasons 20 MG TABS tablet TAKE 1 TABLET BY MOUTH ONCE DAILY WITH SUPPER 30 tablet 11   No current facility-administered medications for this visit.     Allergies:   Patient has no known allergies.    Social History:  The patient  reports that she quit smoking about 37 years ago. Her smoking use included cigarettes. She has never used smokeless tobacco. She reports that she drinks about 14.0 standard drinks of alcohol per week. She reports that she does not use drugs.   Family History:  The patient's family history includes CAD in her brother; Congestive Heart Failure in her mother; Dementia in her mother; Diabetes in her paternal grandfather; Heart attack (age of onset: 75) in her father; Hypertension in her mother; Leukemia in her daughter; Lung cancer in her son.    ROS:  Please see the history of present illness.   Otherwise,  review of systems are positive for none.   All other systems are reviewed and negative.    PHYSICAL EXAM: VS:  BP 120/72   Pulse 62   Ht 5\' 1"  (1.549 m)   Wt 127 lb 12.8 oz (58 kg)   BMI 24.15 kg/m  , BMI Body mass index is 24.15 kg/m. GENERAL:  Well appearing HEENT: Pupils equal round and reactive, fundi not visualized, oral mucosa unremarkable NECK:  No jugular venous distention, waveform within normal limits, carotid upstroke brisk and symmetric, no bruits LUNGS:  Clear to auscultation bilaterally HEART:  RRR.  PMI not displaced or sustained,S1 and S2 within normal limits, no S3,  no S4, no clicks, no rubs, no murmurs ABD:  Flat, positive bowel sounds normal in frequency in pitch, no bruits, no rebound, no guarding, no midline pulsatile mass, no hepatomegaly, no splenomegaly EXT:  2 plus pulses throughout, no edema, no cyanosis no clubbing SKIN:  No rashes no nodules NEURO:  Cranial nerves II through XII grossly intact, motor grossly intact throughout PSYCH:  Cognitively intact, oriented to person place and time   EKG:  EKG is ordered today. 12/07/16: AP VS.  Rate 65 bpm.  Non-specific t wave abnormalities.   11/18/17: AP VS.  Prolonged AV delay.  Non-specific t wave abnormalities.   Echo 06/05/15: Study Conclusions  - Left ventricle: The cavity size was normal. Wall thickness was  normal. Systolic function was normal. The estimated ejection  fraction was in the range of 55% to 60%. Wall motion was normal;  there were no regional wall motion abnormalities. - Aortic valve: Trileaflet; mildly thickened, mildly calcified  leaflets. There was mild regurgitation. - Mitral valve: Calcified annulus. - Left atrium: The atrium was mildly dilated. - Right ventricle: Pacer wire or catheter noted in right ventricle.  Impressions:  - Compared to the prior study, there has been no significant  interval change.   Recent Labs: No results found for requested labs within last  8760 hours.  02/28/15: TSH 7.24, free T4 130  04/02/16: Sodium 136, potassium 4.3, BUN 22, creatinine 0.8 WBC 6.91, hemoglobin, 12.9, hematocrit 41.2, platelets 208 Total cholesterol 163, triglycerides 45, HDL 70, LDL 84 TSH 2.12, free T4 1.3  03/23/2017:  Total cholesterol 161, triglycerides 46, HDL 73, LDL 79 ALT 25  Lipid Panel    Component Value Date/Time   CHOL 167 10/08/2015 0848   TRIG 56 10/08/2015 0848   HDL 94 10/08/2015 0848   CHOLHDL 1.8 10/08/2015 0848   VLDL 11 10/08/2015 0848   LDLCALC 62 10/08/2015 0848      Wt Readings from Last 3 Encounters:  11/18/17 127 lb 12.8 oz (58 kg)  09/07/17 130 lb 12.8 oz (59.3 kg)  07/13/17 127 lb (57.6 kg)     ASSESSMENT AND PLAN:  # Atrial fibrillation:  Ms. Warmack remains in sinus rhythm.  She is still on amiodarone.  PFTs were stable 06/2017.   Continue Xarelto, amiodarone, and metoprolol..  This patients CHA2DS2-VASc Score and unadjusted Ischemic Stroke Rate (% per year) is equal to 4.8 % stroke rate/year from a score of 4 Above score calculated as 1 point each if present [CHF, HTN, DM, Vascular=MI/PAD/Aortic Plaque, Age if 65-74, or Female] Above score calculated as 2 points each if present [Age > 75, or Stroke/TIA/TE]  # Hyperlipidemia: ASCVD 10 year risk is 34%.  LDL 79 03/2017.  She will have lipids rechecked with her PCP 03/2018.  # Asymptomatic coronary calcification: Asymptomatic.  She exercises regularly.  Continue rosuvastatin and Xarelto.    Current medicines are reviewed at length with the patient today.  The patient does not have concerns regarding medicines.  The following changes have been made:  None   Labs/ tests ordered today include:   No orders of the defined types were placed in this encounter.   Disposition:   FU with Brynnan Rodenbaugh C. Oval Linsey, MD, [facility] in 1 year.     Signed, Ericah Scotto C. Oval Linsey, MD, [facility]  11/18/2017 2:32 PM     [facility]

## 2017-11-21 ENCOUNTER — Telehealth: Payer: Self-pay | Admitting: Cardiovascular Disease

## 2017-11-21 NOTE — Telephone Encounter (Signed)
  Patient calling the office for samples of medication:   1.  What medication and dosage are you requesting samples for? XARELTO 20 MG TABS tablet  2.  Are you currently out of this medication?  Almost out

## 2017-11-21 NOTE — Telephone Encounter (Signed)
Left message for patient, samples at the front desk for pick up including patient assistance info.

## 2017-11-23 ENCOUNTER — Encounter: Payer: Self-pay | Admitting: Cardiology

## 2017-12-05 DIAGNOSIS — M9903 Segmental and somatic dysfunction of lumbar region: Secondary | ICD-10-CM | POA: Diagnosis not present

## 2017-12-05 DIAGNOSIS — M5116 Intervertebral disc disorders with radiculopathy, lumbar region: Secondary | ICD-10-CM | POA: Diagnosis not present

## 2017-12-08 LAB — CUP PACEART REMOTE DEVICE CHECK
Battery Voltage: 2.9 V
Brady Statistic AP VP Percent: 0.01 %
Brady Statistic AS VP Percent: 0 %
Brady Statistic RA Percent Paced: 96.42 %
Brady Statistic RV Percent Paced: 0.01 %
Date Time Interrogation Session: 20191008160344
Implantable Lead Implant Date: 20101109
Implantable Lead Location: 753859
Implantable Lead Location: 753860
Implantable Lead Model: 4469
Implantable Lead Serial Number: 535369
Implantable Pulse Generator Implant Date: 20101109
Lead Channel Setting Pacing Amplitude: 2 V
Lead Channel Setting Pacing Pulse Width: 0.4 ms
Lead Channel Setting Sensing Sensitivity: 0.9 mV
MDC IDC LEAD IMPLANT DT: 20101109
MDC IDC LEAD SERIAL: 657967
MDC IDC MSMT LEADCHNL RA IMPEDANCE VALUE: 464 Ohm
MDC IDC MSMT LEADCHNL RA SENSING INTR AMPL: 0.618 mV
MDC IDC MSMT LEADCHNL RV IMPEDANCE VALUE: 456 Ohm
MDC IDC MSMT LEADCHNL RV SENSING INTR AMPL: 9.037 mV
MDC IDC SET LEADCHNL RV PACING AMPLITUDE: 2.5 V
MDC IDC STAT BRADY AP VS PERCENT: 96.41 %
MDC IDC STAT BRADY AS VS PERCENT: 3.58 %

## 2017-12-19 ENCOUNTER — Ambulatory Visit (INDEPENDENT_AMBULATORY_CARE_PROVIDER_SITE_OTHER): Payer: Medicare Other | Admitting: *Deleted

## 2017-12-19 DIAGNOSIS — I10 Essential (primary) hypertension: Secondary | ICD-10-CM

## 2017-12-19 DIAGNOSIS — I495 Sick sinus syndrome: Secondary | ICD-10-CM

## 2017-12-19 NOTE — Progress Notes (Signed)
Remote pacemaker transmission.   

## 2018-01-03 DIAGNOSIS — M9903 Segmental and somatic dysfunction of lumbar region: Secondary | ICD-10-CM | POA: Diagnosis not present

## 2018-01-03 DIAGNOSIS — M5116 Intervertebral disc disorders with radiculopathy, lumbar region: Secondary | ICD-10-CM | POA: Diagnosis not present

## 2018-01-10 ENCOUNTER — Telehealth: Payer: Self-pay

## 2018-01-10 NOTE — Telephone Encounter (Signed)
Remote from 01/10/18 reviewed. Presenting rhythm: AsVs. No AT/AF or ventricular arrhythmias recorded. 0.7PVCs/HR. Stable lead measurements. Normal device function.

## 2018-01-10 NOTE — Telephone Encounter (Signed)
Pt called to report that she has felt out of "rythmn" all afternoon today.. She denies dizziness, sob, chest pain.. She just feels it is not beating right.. but she says after a pause she feels that her pacemaker does "kick in"... I advised her to continue to take all of her meds which she reports that she is doing and will forward to Dr. Rayann Heman and Sonia Baller for further recommendations.

## 2018-01-10 NOTE — Telephone Encounter (Signed)
Patient returned call

## 2018-01-10 NOTE — Telephone Encounter (Signed)
LMTCB re: her My Chart message.. Pt wrote that she felt she was in AFIB all day today.

## 2018-01-11 MED ORDER — METOPROLOL SUCCINATE ER 50 MG PO TB24: 50.0000 mg | ORAL_TABLET | Freq: Two times a day (BID) | ORAL | 3 refills | Status: DC

## 2018-01-11 NOTE — Telephone Encounter (Signed)
Call returned to Pt.   Advised per Dr.Allred, ok for Pt to return to Toprol XL 50 mg bid.  Per Pt she already did that a couple of days ago and now feels great.  Will update medication list.   Advised Pt to call if she had any further needs.

## 2018-01-12 ENCOUNTER — Ambulatory Visit (INDEPENDENT_AMBULATORY_CARE_PROVIDER_SITE_OTHER): Payer: Medicare Other

## 2018-01-12 DIAGNOSIS — I495 Sick sinus syndrome: Secondary | ICD-10-CM

## 2018-01-12 NOTE — Progress Notes (Signed)
Remote pacemaker transmission.   

## 2018-01-17 ENCOUNTER — Encounter: Payer: Self-pay | Admitting: Cardiology

## 2018-02-05 ENCOUNTER — Other Ambulatory Visit: Payer: Self-pay | Admitting: Cardiovascular Disease

## 2018-02-06 ENCOUNTER — Other Ambulatory Visit: Payer: Self-pay

## 2018-02-06 MED ORDER — ROSUVASTATIN CALCIUM 10 MG PO TABS: 10.0000 mg | ORAL_TABLET | Freq: Every day | ORAL | 3 refills | Status: DC

## 2018-02-09 ENCOUNTER — Other Ambulatory Visit (HOSPITAL_COMMUNITY): Payer: Self-pay | Admitting: Cardiovascular Disease

## 2018-02-09 LAB — CUP PACEART REMOTE DEVICE CHECK
Brady Statistic AP VP Percent: 0 %
Brady Statistic AP VS Percent: 97.62 %
Brady Statistic AS VP Percent: 0 %
Brady Statistic AS VS Percent: 2.37 %
Brady Statistic RA Percent Paced: 97.62 %
Date Time Interrogation Session: 20191111153905
Implantable Lead Implant Date: 20101109
Implantable Lead Location: 753859
Implantable Lead Serial Number: 657967
Lead Channel Impedance Value: 448 Ohm
Lead Channel Impedance Value: 456 Ohm
Lead Channel Sensing Intrinsic Amplitude: 9.707 mV
Lead Channel Setting Pacing Amplitude: 2.5 V
Lead Channel Setting Pacing Pulse Width: 0.4 ms
MDC IDC LEAD IMPLANT DT: 20101109
MDC IDC LEAD LOCATION: 753860
MDC IDC LEAD SERIAL: 535369
MDC IDC MSMT BATTERY VOLTAGE: 2.9 V
MDC IDC MSMT LEADCHNL RA SENSING INTR AMPL: 0.663 mV
MDC IDC PG IMPLANT DT: 20101109
MDC IDC SET LEADCHNL RA PACING AMPLITUDE: 2 V
MDC IDC SET LEADCHNL RV SENSING SENSITIVITY: 0.9 mV
MDC IDC STAT BRADY RV PERCENT PACED: 0 %

## 2018-02-13 ENCOUNTER — Ambulatory Visit (INDEPENDENT_AMBULATORY_CARE_PROVIDER_SITE_OTHER): Payer: Medicare Other

## 2018-02-13 DIAGNOSIS — I495 Sick sinus syndrome: Secondary | ICD-10-CM

## 2018-02-14 LAB — CUP PACEART REMOTE DEVICE CHECK
Brady Statistic AP VP Percent: 0.02 %
Brady Statistic AP VS Percent: 93.32 %
Brady Statistic AS VP Percent: 0 %
Brady Statistic RA Percent Paced: 93.33 %
Brady Statistic RV Percent Paced: 0.02 %
Implantable Lead Implant Date: 20101109
Implantable Lead Location: 753859
Implantable Lead Model: 4469
Implantable Lead Model: 4470
Implantable Lead Serial Number: 535369
Implantable Lead Serial Number: 657967
Lead Channel Impedance Value: 440 Ohm
Lead Channel Sensing Intrinsic Amplitude: 0.663 mV
Lead Channel Setting Pacing Amplitude: 2 V
Lead Channel Setting Pacing Amplitude: 2.5 V
Lead Channel Setting Pacing Pulse Width: 0.4 ms
Lead Channel Setting Sensing Sensitivity: 0.9 mV
MDC IDC LEAD IMPLANT DT: 20101109
MDC IDC LEAD LOCATION: 753860
MDC IDC MSMT BATTERY VOLTAGE: 2.89 V
MDC IDC MSMT LEADCHNL RV IMPEDANCE VALUE: 448 Ohm
MDC IDC MSMT LEADCHNL RV SENSING INTR AMPL: 9.372 mV
MDC IDC PG IMPLANT DT: 20101109
MDC IDC SESS DTM: 20200106205123
MDC IDC STAT BRADY AS VS PERCENT: 6.65 %

## 2018-02-14 NOTE — Progress Notes (Signed)
Remote pacemaker transmission.   

## 2018-02-16 DIAGNOSIS — M9903 Segmental and somatic dysfunction of lumbar region: Secondary | ICD-10-CM | POA: Diagnosis not present

## 2018-02-16 DIAGNOSIS — M5116 Intervertebral disc disorders with radiculopathy, lumbar region: Secondary | ICD-10-CM | POA: Diagnosis not present

## 2018-02-20 ENCOUNTER — Other Ambulatory Visit (HOSPITAL_COMMUNITY): Payer: Self-pay | Admitting: *Deleted

## 2018-02-20 MED ORDER — METOPROLOL SUCCINATE ER 50 MG PO TB24: 50.0000 mg | ORAL_TABLET | Freq: Two times a day (BID) | ORAL | 3 refills | Status: DC

## 2018-02-26 LAB — CUP PACEART REMOTE DEVICE CHECK
Battery Voltage: 2.89 V
Brady Statistic AP VP Percent: 0.09 %
Brady Statistic AS VP Percent: 0.32 %
Brady Statistic RV Percent Paced: 0.41 %
Date Time Interrogation Session: 20191205155300
Implantable Lead Implant Date: 20101109
Implantable Lead Location: 753860
Implantable Lead Serial Number: 657967
Implantable Pulse Generator Implant Date: 20101109
Lead Channel Impedance Value: 456 Ohm
Lead Channel Setting Pacing Pulse Width: 0.4 ms
MDC IDC LEAD IMPLANT DT: 20101109
MDC IDC LEAD LOCATION: 753859
MDC IDC LEAD SERIAL: 535369
MDC IDC MSMT LEADCHNL RA IMPEDANCE VALUE: 488 Ohm
MDC IDC MSMT LEADCHNL RA SENSING INTR AMPL: 0.839 mV
MDC IDC MSMT LEADCHNL RV SENSING INTR AMPL: 9.707 mV
MDC IDC SET LEADCHNL RA PACING AMPLITUDE: 2 V
MDC IDC SET LEADCHNL RV PACING AMPLITUDE: 2.5 V
MDC IDC SET LEADCHNL RV SENSING SENSITIVITY: 0.9 mV
MDC IDC STAT BRADY AP VS PERCENT: 25.61 %
MDC IDC STAT BRADY AS VS PERCENT: 73.99 %
MDC IDC STAT BRADY RA PERCENT PACED: 25.64 %

## 2018-03-08 DIAGNOSIS — Z01419 Encounter for gynecological examination (general) (routine) without abnormal findings: Secondary | ICD-10-CM | POA: Diagnosis not present

## 2018-03-08 DIAGNOSIS — N8111 Cystocele, midline: Secondary | ICD-10-CM | POA: Diagnosis not present

## 2018-03-13 ENCOUNTER — Ambulatory Visit: Payer: Medicare Other | Admitting: Internal Medicine

## 2018-03-13 ENCOUNTER — Encounter: Payer: Self-pay | Admitting: Internal Medicine

## 2018-03-13 VITALS — BP 108/68 | HR 94 | Ht 61.0 in | Wt 128.8 lb

## 2018-03-13 DIAGNOSIS — I480 Paroxysmal atrial fibrillation: Secondary | ICD-10-CM

## 2018-03-13 DIAGNOSIS — I495 Sick sinus syndrome: Secondary | ICD-10-CM | POA: Diagnosis not present

## 2018-03-13 DIAGNOSIS — Z950 Presence of cardiac pacemaker: Secondary | ICD-10-CM | POA: Diagnosis not present

## 2018-03-13 DIAGNOSIS — I10 Essential (primary) hypertension: Secondary | ICD-10-CM | POA: Diagnosis not present

## 2018-03-13 MED ORDER — AMIODARONE HCL 200 MG PO TABS: 200.0000 mg | ORAL_TABLET | Freq: Two times a day (BID) | ORAL | 0 refills | Status: DC

## 2018-03-13 NOTE — Patient Instructions (Addendum)
Medication Instructions:  Your physician has recommended you make the following change in your medication:   1.  Increase your amiodarone 200 mg---Take one tablet by mouth twice a day x 2 weeks   Labwork: None ordered.  Testing/Procedures: None ordered.  Follow-Up: Your physician wants you to follow-up in: 2 weeks with Dr. Rayann Heman.   Appointment made for March 30, 2018 at 2:30 pm   Remote monitoring is used to monitor your Pacemaker from home. This monitoring reduces the number of office visits required to check your device to one time per year. It allows [place] to keep an eye on the functioning of your device to ensure it is working properly. You are scheduled for a device check from home on 03/16/2018. You may send your transmission at any time that day. If you have a wireless device, the transmission will be sent automatically. After your physician reviews your transmission, you will receive a postcard with your next transmission date.  Any Other Special Instructions Will Be Listed Below (If Applicable).  If you need a refill on your cardiac medications before your next appointment, please call your pharmacy.

## 2018-03-13 NOTE — Progress Notes (Signed)
PCP: Prince Solian, MD Primary Cardiologist: Dr Oval Linsey Primary EP:  Dr Rayann Heman  Kathryn Gibson is a 85 y.o. female who presents today for routine electrophysiology followup.  Since last being seen in our clinic, the patient reports doing very well.  She has noticed increased palpitations over the past few days.  + fatigue.  Today, she denies symptoms of chest pain, shortness of breath,  lower extremity edema, dizziness, presyncope, or syncope.  The patient is otherwise without complaint today.   Past Medical History:  Diagnosis Date  . Arthritis   . Atrial fibrillation ([place])   . Bradycardia    s/p PPM  . Cataract   . Colonic polyp    adenomatous  . Constipation   . Diverticulosis   . Hemorrhoids   . Hypertension   . Hypothyroidism   . Irritable bowel syndrome (IBS)   . Pacemaker 2010   Past Surgical History:  Procedure Laterality Date  . BREAST CYST ASPIRATION Left 1980s  . COLONOSCOPY    . ELECTROPHYSIOLOGIC STUDY N/A 07/01/2015   Procedure: Atrial Fibrillation Ablation;  Surgeon: Thompson Grayer, MD;  Location: [place] [facility];  Service: Cardiovascular;  Laterality: N/A;  . EYE SURGERY    . HEMATOMA EVACUATION Right 07/15/2014   Procedure: EVACUATION Right Groin Hematoma;  Surgeon: Rosetta Posner, MD;  Location: [place];  Service: Vascular;  Laterality: Right;  . NASAL ENDOSCOPY WITH EPISTAXIS CONTROL N/A 07/14/2014   Procedure: NASAL ENDOSCOPY WITH EPISTAXIS CONTROL;  Surgeon: Ruby Cola, MD;  Location: [place];  Service: ENT;  Laterality: N/A;  . NASAL ENDOSCOPY WITH EPISTAXIS CONTROL Bilateral 07/15/2014   Procedure: NASAL ENDOSCOPY WITH EPISTAXIS CONTROL;  Surgeon: Ruby Cola, MD;  Location: [place];  Service: ENT;  Laterality: Bilateral;  . PACEMAKER INSERTION  2010   implanted by Dr Doreatha Lew ([facility])  . RADIOLOGY WITH ANESTHESIA N/A 07/14/2014   Procedure: RADIOLOGY WITH ANESTHESIA;  Surgeon: Medication Radiologist, MD;  Location: [place];  Service: Radiology;  Laterality:  N/A;  . TEE WITHOUT CARDIOVERSION N/A 07/01/2015   Procedure: TRANSESOPHAGEAL ECHOCARDIOGRAM (TEE);  Surgeon: Sueanne Margarita, MD;  Location: [facility] ENDOSCOPY;  Service: Cardiovascular;  Laterality: N/A;    ROS- all systems are reviewed and negative except as per HPI above  Current Outpatient Medications  Medication Sig Dispense Refill  . acetaminophen (TYLENOL) 500 MG tablet Take 500-1,000 mg by mouth every 6 (six) hours as needed for moderate pain or headache.    Marland Kitchen amiodarone (PACERONE) 200 MG tablet TAKE ONE-HALF TABLET BY MOUTH ONCE DAILY 45 tablet 0  . Ascorbic Acid (VITAMIN C) 1000 MG tablet Take 1,000 mg by mouth daily.      . Calcium-Vitamin D (CALTRATE 600 PLUS-VIT D PO) Take 1 tablet by mouth daily.    . cholecalciferol (VITAMIN D) 1000 UNITS tablet Take 1,000 Units by mouth daily.      Marland Kitchen diltiazem (CARDIZEM CD) 360 MG 24 hr capsule TAKE 1 CAPSULE BY MOUTH ONCE DAILY 30 capsule 9  . LYSINE PO Take 1-2 tablets by mouth daily as needed (fever blister).    . Magnesium 250 MG TABS Take by mouth daily.    . metoprolol succinate (TOPROL-XL) 50 MG 24 hr tablet Take 1 tablet (50 mg total) by mouth 2 (two) times daily. Take with or immediately following a meal. 180 tablet 3  . Multiple Vitamins-Minerals (ICAPS AREDS 2 PO) Take by mouth daily.    . Omega-3 Fatty Acids (FISH OIL) 1200 MG CAPS Take  1 capsule by mouth daily. Mega Red.    . polyethylene glycol powder (GLYCOLAX/MIRALAX) powder Take 2 capfuls by mouth daily  0  . Probiotic Product (ALIGN PO) Take 1 capsule by mouth daily.     . rosuvastatin (CRESTOR) 10 MG tablet Take 1 tablet (10 mg total) by mouth daily. 90 tablet 3  . SYNTHROID 50 MCG tablet as directed.  7  . SYNTHROID 75 MCG tablet Take 50-75 mcg by mouth daily before breakfast. Alternates between 50 mcg and 75 mcg daily  2  . temazepam (RESTORIL) 30 MG capsule Take 30 mg by mouth at bedtime.     Alveda Reasons 20 MG TABS tablet TAKE 1 TABLET BY MOUTH ONCE DAILY WITH SUPPER 30 tablet  11   No current facility-administered medications for this visit.     Physical Exam: Vitals:   03/13/18 1434  BP: 108/68  Pulse: 94  SpO2: 99%  Weight: 128 lb 12.8 oz (58.4 kg)  Height: 5\' 1"  (1.549 m)    GEN- The patient is well appearing, alert and oriented x 3 today.   Head- normocephalic, atraumatic Eyes-  Sclera clear, conjunctiva pink Ears- hearing intact Oropharynx- clear Lungs- Clear to ausculation bilaterally, normal work of breathing Chest- pacemaker pocket is well healed Heart- Regular rate and rhythm, no murmurs, rubs or gallops, PMI not laterally displaced GI- soft, NT, ND, + BS Extremities- no clubbing, cyanosis, or edema  Pacemaker interrogation- reviewed in detail today,  See PACEART report  ekg tracing ordered today is personally reviewed and shows atach with V pacing  Assessment and Plan:  1. Symptomatic sinus bradycardia  Normal pacemaker function See Pace Art report AV intervals extended to minimize V pacing as she has had increased V pacing recently  2. afib Well controlled afib burden is 0 %, Today, she is in an atrial tachycardia.  I did attempt programmed stim through her Pacemaker.  This was unsuccessful in termination of atach. I will therefore increase amiodarone to 200mg  BID x 2 weeks continue xarelto  3. HTN Stable No change required today  carelink Return in 2 weeks  Thompson Grayer MD, [facility] 03/13/2018 2:49 PM

## 2018-03-14 LAB — CUP PACEART INCLINIC DEVICE CHECK
Date Time Interrogation Session: 20200204165653
Implantable Lead Implant Date: 20101109
Implantable Lead Location: 753859
Implantable Lead Location: 753860
Implantable Lead Model: 4469
Implantable Lead Model: 4470
Implantable Lead Serial Number: 657967
Implantable Pulse Generator Implant Date: 20101109
MDC IDC LEAD IMPLANT DT: 20101109
MDC IDC LEAD SERIAL: 535369

## 2018-03-16 ENCOUNTER — Ambulatory Visit (INDEPENDENT_AMBULATORY_CARE_PROVIDER_SITE_OTHER): Payer: Medicare Other

## 2018-03-16 DIAGNOSIS — R002 Palpitations: Secondary | ICD-10-CM

## 2018-03-16 DIAGNOSIS — I495 Sick sinus syndrome: Secondary | ICD-10-CM

## 2018-03-17 LAB — CUP PACEART REMOTE DEVICE CHECK
Battery Voltage: 2.89 V
Brady Statistic AP VP Percent: 4.25 %
Brady Statistic AP VS Percent: 67.17 %
Brady Statistic AS VP Percent: 3.8 %
Brady Statistic AS VS Percent: 24.78 %
Brady Statistic RA Percent Paced: 67.95 %
Brady Statistic RV Percent Paced: 6.28 %
Date Time Interrogation Session: 20200206201744
Implantable Lead Implant Date: 20101109
Implantable Lead Implant Date: 20101109
Implantable Lead Location: 753859
Implantable Lead Location: 753860
Implantable Lead Model: 4469
Implantable Lead Model: 4470
Implantable Lead Serial Number: 535369
Implantable Lead Serial Number: 657967
Implantable Pulse Generator Implant Date: 20101109
Lead Channel Impedance Value: 448 Ohm
Lead Channel Impedance Value: 456 Ohm
Lead Channel Sensing Intrinsic Amplitude: 0.618 mV
Lead Channel Sensing Intrinsic Amplitude: 10.376 mV
Lead Channel Setting Pacing Amplitude: 2 V
Lead Channel Setting Pacing Amplitude: 2.5 V
Lead Channel Setting Pacing Pulse Width: 0.4 ms
Lead Channel Setting Sensing Sensitivity: 0.9 mV

## 2018-03-27 NOTE — Progress Notes (Signed)
Remote pacemaker transmission.   

## 2018-03-30 ENCOUNTER — Encounter: Payer: Self-pay | Admitting: Internal Medicine

## 2018-03-30 ENCOUNTER — Ambulatory Visit: Payer: Medicare Other | Admitting: Internal Medicine

## 2018-03-30 VITALS — BP 118/68 | HR 64 | Ht 61.0 in | Wt 127.0 lb

## 2018-03-30 DIAGNOSIS — I495 Sick sinus syndrome: Secondary | ICD-10-CM

## 2018-03-30 DIAGNOSIS — I480 Paroxysmal atrial fibrillation: Secondary | ICD-10-CM | POA: Diagnosis not present

## 2018-03-30 DIAGNOSIS — Z950 Presence of cardiac pacemaker: Secondary | ICD-10-CM

## 2018-03-30 DIAGNOSIS — I10 Essential (primary) hypertension: Secondary | ICD-10-CM | POA: Diagnosis not present

## 2018-03-30 MED ORDER — AMIODARONE HCL 200 MG PO TABS: 200.0000 mg | ORAL_TABLET | Freq: Every day | ORAL | 3 refills | Status: DC

## 2018-03-30 NOTE — Patient Instructions (Addendum)
Medication Instructions:  Your physician has recommended you make the following change in your medication:   1.  Reduce your amiodarone 200 mg--Take one tablet by mouth ONCE daily.  Labwork: None ordered.  Testing/Procedures: None ordered.  Follow-Up: Your physician wants you to follow-up in: 3 months with Roderic Palau NP at the afib clinic.  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 This monitoring reduces the number of office visits required to check your device to one time per year. It allows [place] to keep an eye on the functioning of your device to ensure it is working properly. You are scheduled for a device check from home on 04/18/2018. You may send your transmission at any time that day. If you have a wireless device, the transmission will be sent automatically. After your physician reviews your transmission, you will receive a postcard with your next transmission date.  Any Other Special Instructions Will Be Listed Below (If Applicable).  If you need a refill on your cardiac medications before your next appointment, please call your pharmacy.

## 2018-03-30 NOTE — Progress Notes (Signed)
PCP: Prince Solian, MD Primary Cardiologist: Dr Oval Linsey Primary EP:  Dr Rayann Heman  Kathryn Gibson is a 85 y.o. female who presents today for routine electrophysiology followup.  Since last being seen in our clinic, the patient reports doing very well.  Her atrial arrhythmias resolved with increased amiodarone.  Today, she denies symptoms of palpitations, chest pain, shortness of breath,  lower extremity edema, dizziness, presyncope, or syncope.  The patient is otherwise without complaint today.   Past Medical History:  Diagnosis Date  . Arthritis   . Atrial fibrillation ([place])   . Bradycardia    s/p PPM  . Cataract   . Colonic polyp    adenomatous  . Constipation   . Diverticulosis   . Hemorrhoids   . Hypertension   . Hypothyroidism   . Irritable bowel syndrome (IBS)   . Pacemaker 2010   Past Surgical History:  Procedure Laterality Date  . BREAST CYST ASPIRATION Left 1980s  . COLONOSCOPY    . ELECTROPHYSIOLOGIC STUDY N/A 07/01/2015   Procedure: Atrial Fibrillation Ablation;  Surgeon: Thompson Grayer, MD;  Location: [place] [facility];  Service: Cardiovascular;  Laterality: N/A;  . EYE SURGERY    . HEMATOMA EVACUATION Right 07/15/2014   Procedure: EVACUATION Right Groin Hematoma;  Surgeon: Rosetta Posner, MD;  Location: [place];  Service: Vascular;  Laterality: Right;  . NASAL ENDOSCOPY WITH EPISTAXIS CONTROL N/A 07/14/2014   Procedure: NASAL ENDOSCOPY WITH EPISTAXIS CONTROL;  Surgeon: Ruby Cola, MD;  Location: [place];  Service: ENT;  Laterality: N/A;  . NASAL ENDOSCOPY WITH EPISTAXIS CONTROL Bilateral 07/15/2014   Procedure: NASAL ENDOSCOPY WITH EPISTAXIS CONTROL;  Surgeon: Ruby Cola, MD;  Location: [place];  Service: ENT;  Laterality: Bilateral;  . PACEMAKER INSERTION  2010   implanted by Dr Doreatha Lew (MDT)  . RADIOLOGY WITH ANESTHESIA N/A 07/14/2014   Procedure: RADIOLOGY WITH ANESTHESIA;  Surgeon: Medication Radiologist, MD;  Location: [place];  Service: Radiology;  Laterality: N/A;   . TEE WITHOUT CARDIOVERSION N/A 07/01/2015   Procedure: TRANSESOPHAGEAL ECHOCARDIOGRAM (TEE);  Surgeon: Sueanne Margarita, MD;  Location: [facility] ENDOSCOPY;  Service: Cardiovascular;  Laterality: N/A;    ROS- all systems are reviewed and negative except as per HPI above  Current Outpatient Medications  Medication Sig Dispense Refill  . acetaminophen (TYLENOL) 500 MG tablet Take 500-1,000 mg by mouth every 6 (six) hours as needed for moderate pain or headache.    . Ascorbic Acid (VITAMIN C) 1000 MG tablet Take 1,000 mg by mouth daily.      . Calcium-Vitamin D (CALTRATE 600 PLUS-VIT D PO) Take 1 tablet by mouth daily.    . cholecalciferol (VITAMIN D) 1000 UNITS tablet Take 1,000 Units by mouth daily.      Marland Kitchen diltiazem (CARDIZEM CD) 360 MG 24 hr capsule TAKE 1 CAPSULE BY MOUTH ONCE DAILY 30 capsule 9  . LYSINE PO Take 1-2 tablets by mouth daily as needed (fever blister).    . Magnesium 250 MG TABS Take by mouth daily.    . metoprolol succinate (TOPROL-XL) 50 MG 24 hr tablet Take 1 tablet (50 mg total) by mouth 2 (two) times daily. Take with or immediately following a meal. 180 tablet 3  . Multiple Vitamins-Minerals (ICAPS AREDS 2 PO) Take by mouth daily.    . Omega-3 Fatty Acids (FISH OIL) 1200 MG CAPS Take 1 capsule by mouth daily. Mega Red.    . polyethylene glycol powder (GLYCOLAX/MIRALAX) powder Take 2 capfuls by mouth daily  0  . Probiotic Product (ALIGN PO) Take 1 capsule by mouth daily.     . rosuvastatin (CRESTOR) 10 MG tablet Take 1 tablet (10 mg total) by mouth daily. 90 tablet 3  . SYNTHROID 50 MCG tablet as directed.  7  . SYNTHROID 75 MCG tablet Take 50-75 mcg by mouth daily before breakfast. Alternates between 50 mcg and 75 mcg daily  2  . temazepam (RESTORIL) 30 MG capsule Take 30 mg by mouth at bedtime.     Alveda Reasons 20 MG TABS tablet TAKE 1 TABLET BY MOUTH ONCE DAILY WITH SUPPER 30 tablet 11  . amiodarone (PACERONE) 200 MG tablet Take 1 tablet (200 mg total) by mouth 2 (two) times  daily for 14 days. 28 tablet 0   No current facility-administered medications for this visit.     Physical Exam: Vitals:   03/30/18 1427  BP: 118/68  Pulse: 64  SpO2: 98%  Weight: 127 lb (57.6 kg)  Height: 5\' 1"  (1.549 m)    GEN- The patient is well appearing, alert and oriented x 3 today.   Head- normocephalic, atraumatic Eyes-  Sclera clear, conjunctiva pink Ears- hearing intact Oropharynx- clear Lungs- Clear to ausculation bilaterally, normal work of breathing Chest- pacemaker pocket is well healed Heart- Regular rate and rhythm, no murmurs, rubs or gallops, PMI not laterally displaced GI- soft, NT, ND, + BS Extremities- no clubbing, cyanosis, or edema  Pacemaker interrogation- reviewed in detail today,  See PACEART report  ekg tracing ordered today is personally reviewed and shows atrial paced rhythm, PR 286 msec, nonspecific St/T changes  Assessment and Plan:  1. Symptomatic sinus bradycardia  Normal pacemaker function See Pace Art report No changes today  2. Afib No afib by device interrogation She was in an atrial tachycardia last visit.  I increased her amiodarone to 200mg  BID.  She is back in sinus today! Continue xarelto. Reduce amiodarone to 200mg  daily Hopefully we can eventually get back to amiodarone 100mg  daily  3. HTN Stable No change required today  Carelink monthly due to approaching ERI Return to AF clinic in 3 months Follow-up with Dr Oval Linsey as scheduled  Thompson Grayer MD, [facility] 03/30/2018 2:36 PM

## 2018-03-31 LAB — CUP PACEART INCLINIC DEVICE CHECK
Battery Voltage: 2.89 V
Brady Statistic AP VP Percent: 1.19 %
Brady Statistic AP VS Percent: 74.82 %
Brady Statistic AS VP Percent: 1.34 %
Brady Statistic AS VS Percent: 22.66 %
Brady Statistic RA Percent Paced: 75.04 %
Brady Statistic RV Percent Paced: 2.21 %
Implantable Lead Implant Date: 20101109
Implantable Lead Implant Date: 20101109
Implantable Lead Location: 753859
Implantable Lead Model: 4469
Implantable Lead Model: 4470
Implantable Lead Serial Number: 535369
Implantable Lead Serial Number: 657967
Implantable Pulse Generator Implant Date: 20101109
Lead Channel Impedance Value: 424 Ohm
Lead Channel Impedance Value: 440 Ohm
Lead Channel Sensing Intrinsic Amplitude: 0.839 mV
Lead Channel Sensing Intrinsic Amplitude: 10.711 mV
Lead Channel Setting Pacing Amplitude: 2 V
Lead Channel Setting Pacing Amplitude: 2.5 V
Lead Channel Setting Sensing Sensitivity: 0.9 mV
MDC IDC LEAD LOCATION: 753860
MDC IDC SESS DTM: 20200220204249
MDC IDC SET LEADCHNL RV PACING PULSEWIDTH: 0.4 ms

## 2018-04-11 DIAGNOSIS — E038 Other specified hypothyroidism: Secondary | ICD-10-CM | POA: Diagnosis not present

## 2018-04-11 DIAGNOSIS — I10 Essential (primary) hypertension: Secondary | ICD-10-CM | POA: Diagnosis not present

## 2018-04-11 DIAGNOSIS — R82998 Other abnormal findings in urine: Secondary | ICD-10-CM | POA: Diagnosis not present

## 2018-04-11 DIAGNOSIS — M859 Disorder of bone density and structure, unspecified: Secondary | ICD-10-CM | POA: Diagnosis not present

## 2018-04-11 DIAGNOSIS — E7849 Other hyperlipidemia: Secondary | ICD-10-CM | POA: Diagnosis not present

## 2018-04-18 ENCOUNTER — Ambulatory Visit (INDEPENDENT_AMBULATORY_CARE_PROVIDER_SITE_OTHER): Payer: Medicare Other | Admitting: *Deleted

## 2018-04-18 DIAGNOSIS — I495 Sick sinus syndrome: Secondary | ICD-10-CM

## 2018-04-18 DIAGNOSIS — Z0000 Encounter for general adult medical examination without abnormal findings: Secondary | ICD-10-CM | POA: Diagnosis not present

## 2018-04-18 DIAGNOSIS — I10 Essential (primary) hypertension: Secondary | ICD-10-CM | POA: Diagnosis not present

## 2018-04-18 DIAGNOSIS — E7849 Other hyperlipidemia: Secondary | ICD-10-CM | POA: Diagnosis not present

## 2018-04-18 DIAGNOSIS — I2510 Atherosclerotic heart disease of native coronary artery without angina pectoris: Secondary | ICD-10-CM | POA: Diagnosis not present

## 2018-04-19 ENCOUNTER — Telehealth: Payer: Self-pay

## 2018-04-19 NOTE — Telephone Encounter (Signed)
Left message for patient to remind of missed remote transmission.  

## 2018-04-20 LAB — CUP PACEART REMOTE DEVICE CHECK
Battery Voltage: 2.89 V
Brady Statistic AP VP Percent: 0.17 %
Brady Statistic AS VP Percent: 0.17 %
Brady Statistic AS VS Percent: 14.2 %
Brady Statistic RA Percent Paced: 85.57 %
Brady Statistic RV Percent Paced: 0.34 %
Date Time Interrogation Session: 20200311185433
Implantable Lead Implant Date: 20101109
Implantable Lead Implant Date: 20101109
Implantable Lead Location: 753859
Implantable Lead Location: 753860
Implantable Lead Model: 4469
Implantable Lead Model: 4470
Implantable Lead Serial Number: 535369
Implantable Lead Serial Number: 657967
Implantable Pulse Generator Implant Date: 20101109
Lead Channel Impedance Value: 464 Ohm
Lead Channel Impedance Value: 480 Ohm
Lead Channel Sensing Intrinsic Amplitude: 0.751 mV
Lead Channel Sensing Intrinsic Amplitude: 10.376 mV
Lead Channel Setting Pacing Amplitude: 2 V
Lead Channel Setting Sensing Sensitivity: 0.9 mV
MDC IDC SET LEADCHNL RV PACING AMPLITUDE: 2.5 V
MDC IDC SET LEADCHNL RV PACING PULSEWIDTH: 0.4 ms
MDC IDC STAT BRADY AP VS PERCENT: 85.46 %

## 2018-04-25 ENCOUNTER — Encounter: Payer: Self-pay | Admitting: Cardiology

## 2018-04-25 NOTE — Progress Notes (Signed)
Remote pacemaker transmission.   

## 2018-04-25 NOTE — Addendum Note (Signed)
Addended by: Tiajuana Amass on: 04/25/2018 01:50 PM   Modules accepted: Level of Service

## 2018-05-18 ENCOUNTER — Other Ambulatory Visit: Payer: Self-pay

## 2018-05-18 ENCOUNTER — Ambulatory Visit (INDEPENDENT_AMBULATORY_CARE_PROVIDER_SITE_OTHER): Payer: Medicare Other | Admitting: *Deleted

## 2018-05-18 DIAGNOSIS — I495 Sick sinus syndrome: Secondary | ICD-10-CM

## 2018-05-18 LAB — CUP PACEART REMOTE DEVICE CHECK
Battery Voltage: 2.88 V
Brady Statistic AP VP Percent: 0.85 %
Brady Statistic AP VS Percent: 68.5 %
Brady Statistic AS VP Percent: 3.73 %
Brady Statistic AS VS Percent: 26.93 %
Brady Statistic RA Percent Paced: 69.01 %
Brady Statistic RV Percent Paced: 4.57 %
Date Time Interrogation Session: 20200409131801
Implantable Lead Implant Date: 20101109
Implantable Lead Implant Date: 20101109
Implantable Lead Location: 753859
Implantable Lead Location: 753860
Implantable Lead Model: 4469
Implantable Lead Model: 4470
Implantable Lead Serial Number: 535369
Implantable Lead Serial Number: 657967
Implantable Pulse Generator Implant Date: 20101109
Lead Channel Impedance Value: 472 Ohm
Lead Channel Impedance Value: 504 Ohm
Lead Channel Sensing Intrinsic Amplitude: 0.5 mV
Lead Channel Sensing Intrinsic Amplitude: 10.7 mV
Lead Channel Setting Pacing Amplitude: 2 V
Lead Channel Setting Pacing Amplitude: 2.5 V
Lead Channel Setting Pacing Pulse Width: 0.4 ms
Lead Channel Setting Sensing Sensitivity: 0.9 mV

## 2018-05-26 ENCOUNTER — Encounter: Payer: Self-pay | Admitting: Cardiology

## 2018-05-26 NOTE — Progress Notes (Signed)
Remote pacemaker transmission.   

## 2018-06-08 ENCOUNTER — Encounter (HOSPITAL_COMMUNITY): Payer: Self-pay

## 2018-06-08 DIAGNOSIS — R5383 Other fatigue: Secondary | ICD-10-CM | POA: Insufficient documentation

## 2018-06-13 ENCOUNTER — Telehealth: Payer: Medicare Other

## 2018-06-13 ENCOUNTER — Ambulatory Visit (HOSPITAL_COMMUNITY)
Admission: RE | Admit: 2018-06-13 | Discharge: 2018-06-13 | Disposition: A | Discharge status: Home / Self Care | Payer: Medicare Other | Source: Ambulatory Visit | Attending: Nurse Practitioner | Admitting: Nurse Practitioner

## 2018-06-13 ENCOUNTER — Other Ambulatory Visit: Payer: Self-pay

## 2018-06-13 ENCOUNTER — Telehealth (HOSPITAL_COMMUNITY): Payer: Self-pay | Admitting: *Deleted

## 2018-06-13 DIAGNOSIS — E038 Other specified hypothyroidism: Secondary | ICD-10-CM | POA: Diagnosis not present

## 2018-06-13 DIAGNOSIS — R002 Palpitations: Secondary | ICD-10-CM

## 2018-06-13 NOTE — Progress Notes (Signed)
Electrophysiology TeleHealth Note   Due to national recommendations of social distancing due to [place] 19, Audio/videotelehealth visit is felt to be most appropriate for this patient at this time.  See MyChart message/consent belowfrom today for patient consent regarding telehealth for the [facility].    Date:  06/13/2018   ID:  Kathryn Gibson, DOB [birth year], MRN [id number]  Location: home  Provider location: [address] [place], [place] [address] Evaluation Performed:Follow up palpitations  PCP:  Prince Solian, MD  Primary Cardiologist:   none Primary Electrophysiologist: Dr. Rayann Heman  CC: palpitations   History of Present Illness: Kathryn Gibson is a 85 y.o. female who presents via audio conferencing for a telehealth visit today. several attempts were made for a video visit but could not connect on her side.   She noted a few palpitations last weekend and requested a PPM report to be sent early. She is also concerned re the pool being closed at her retirement community and her exercise tolerance decreasing. She is now doing some classes on TV and feel she has regained some of her exercise ability that she lost going to the pool an hour M-F. She had asked for a different sleep med and tried Trazodone which did not agree with her and stopped after one pill. She saw her HR get up to 100 bpm one time this week end and it concerned her. BP is staying around 761 systolic. Her device is now being checked monthly as battery is getting close to ERI per pt. She continues on 200 mg amiodarone a day.   Today, she denies symptoms of palpitations, chest pain, shortness of breath, orthopnea, PND, lower extremity edema, claudication, dizziness, presyncope, syncope, bleeding, or neurologic sequela. The patient is tolerating medications without difficulties and is otherwise without complaint today.   she denies symptoms of cough, fevers, chills, or new SOB worrisome for COVID 19.       Atrial Fibrillation Risk Factors:  she does not have symptoms or diagnosis of sleep apnea. she does not have a history of rheumatic fever. she does not have a history of alcohol use. The patient does not have a history of early familial atrial fibrillation or other arrhythmias.  she has a BMI of There is no height or weight on file to calculate BMI.. There were no vitals filed for this visit.  Past Medical History:  Diagnosis Date   Arthritis    Atrial fibrillation ([place])    Bradycardia    s/p PPM   Cataract    Colonic polyp    adenomatous   Constipation    Diverticulosis    Hemorrhoids    Hypertension    Hypothyroidism    Irritable bowel syndrome (IBS)    Pacemaker 2010   Past Surgical History:  Procedure Laterality Date   BREAST CYST ASPIRATION Left 1980s   COLONOSCOPY     ELECTROPHYSIOLOGIC STUDY N/A 07/01/2015   Procedure: Atrial Fibrillation Ablation;  Surgeon: Thompson Grayer, MD;  Location: [facility];  Service: Cardiovascular;  Laterality: N/A;   EYE SURGERY     HEMATOMA EVACUATION Right 07/15/2014   Procedure: EVACUATION Right Groin Hematoma;  Surgeon: Rosetta Posner, MD;  Location: [facility];  Service: Vascular;  Laterality: Right;   NASAL ENDOSCOPY WITH EPISTAXIS CONTROL N/A 07/14/2014   Procedure: NASAL ENDOSCOPY WITH EPISTAXIS CONTROL;  Surgeon: Ruby Cola, MD;  Location: [facility];  Service: ENT;  Laterality: N/A;   NASAL ENDOSCOPY WITH EPISTAXIS  CONTROL Bilateral 07/15/2014   Procedure: NASAL ENDOSCOPY WITH EPISTAXIS CONTROL;  Surgeon: Ruby Cola, MD;  Location: [facility] OR;  Service: ENT;  Laterality: Bilateral;   PACEMAKER INSERTION  2010   implanted by Dr Doreatha Lew (MDT)   Assumption N/A 07/14/2014   Procedure: RADIOLOGY WITH ANESTHESIA;  Surgeon: Medication Radiologist, MD;  Location: [facility];  Service: Radiology;  Laterality: N/A;   TEE WITHOUT CARDIOVERSION N/A 07/01/2015   Procedure: TRANSESOPHAGEAL ECHOCARDIOGRAM (TEE);   Surgeon: Sueanne Margarita, MD;  [facility];  Service: Cardiovascular;  Laterality: N/A;     Current Outpatient Medications  Medication Sig Dispense Refill   acetaminophen (TYLENOL) 500 MG tablet Take 500-1,000 mg by mouth every 6 (six) hours as needed for moderate pain or headache.     amiodarone (PACERONE) 200 MG tablet Take 1 tablet (200 mg total) by mouth daily. 90 tablet 3   Ascorbic Acid (VITAMIN C) 1000 MG tablet Take 1,000 mg by mouth daily.       Calcium-Vitamin D (CALTRATE 600 PLUS-VIT D PO) Take 1 tablet by mouth daily.     cholecalciferol (VITAMIN D) 1000 UNITS tablet Take 1,000 Units by mouth daily.       diltiazem (CARDIZEM CD) 360 MG 24 hr capsule TAKE 1 CAPSULE BY MOUTH ONCE DAILY 30 capsule 9   LYSINE PO Take 1-2 tablets by mouth daily as needed (fever blister).     Magnesium 250 MG TABS Take by mouth daily.     metoprolol succinate (TOPROL-XL) 50 MG 24 hr tablet Take 1 tablet (50 mg total) by mouth 2 (two) times daily. Take with or immediately following a meal. 180 tablet 3   Multiple Vitamins-Minerals (ICAPS AREDS 2 PO) Take by mouth daily.     Omega-3 Fatty Acids (FISH OIL) 1200 MG CAPS Take 1 capsule by mouth daily. Mega Red.     polyethylene glycol powder (GLYCOLAX/MIRALAX) powder Take 2 capfuls by mouth daily  0   Probiotic Product (ALIGN PO) Take 1 capsule by mouth daily.      rosuvastatin (CRESTOR) 10 MG tablet Take 1 tablet (10 mg total) by mouth daily. 90 tablet 3   SYNTHROID 50 MCG tablet as directed.  7   SYNTHROID 75 MCG tablet Take 50-75 mcg by mouth daily before breakfast. Alternates between 50 mcg and 75 mcg daily  2   temazepam (RESTORIL) 30 MG capsule Take 30 mg by mouth at bedtime.      XARELTO 20 MG TABS tablet TAKE 1 TABLET BY MOUTH ONCE DAILY WITH SUPPER 30 tablet 11   No current facility-administered medications for this encounter.     Allergies:   Trazodone and nefazodone   Social History:  The patient  reports that she  quit smoking about 38 years ago. Her smoking use included cigarettes. She has never used smokeless tobacco. She reports current alcohol use of about 14.0 standard drinks of alcohol per week. She reports that she does not use drugs.   Family History:  The patient's  family history includes CAD in her brother; Congestive Heart Failure in her mother; Dementia in her mother; Diabetes in her paternal grandfather; Heart attack (age of onset: 87) in her father; Hypertension in her mother; Leukemia in her daughter; Lung cancer in her son.    ROS:  Please see the history of present illness.   All other systems are personally reviewed and negative.   Exam: NA phone visit,voice strong, no shortness of breath with normal conversation.  Recent  Labs: No results found for requested labs within last 8760 hours.  personally reviewed    Other studies personally reviewed: P[facility]art report reviewed as well as Epic reports     ASSESSMENT AND PLAN:  1.  Paroxysmal atrial fibrillation Per recent report no significant afib Burden less than 0.01% Pt reassured No change in management She is due another report 5/18 but device clinic will be contacted to see if they want to push out this report since one just sent She was encouraged to continue her exercise classes on TV to maintain  her conditioning since she can not do her water aerobics. Continue xarelto. This patients CHA2DS2-VASc Score and unadjusted Ischemic Stroke Rate (% per year) is equal to 4.8 % stroke rate/year from a score of 4  Above score calculated as 1 point each if present [CHF, HTN, DM, Vascular=MI/PAD/Aortic Plaque, Age if 65-74, or Female] Above score calculated as 2 points each if present [Age > 75, or Stroke/TIA/TE]  2.HTN Stable   COVID screen The patient does not have any symptoms that suggest any further testing/ screening at this time.  Social distancing reinforced today.    Follow-up:  Per remote/device clinic  Current  medicines are reviewed at length with the patient today.   The patient does not have concerns regarding her medicines.  The following changes were made today:  none  Labs/ tests ordered today include: none No orders of the defined types were placed in this encounter.   Patient Risk:  after full review of this patients clinical status, I feel that they are at high risk at this time.   Today, I have spent 15 minutes with the patient with telehealth technology discussing above    Signed, Roderic Palau NP  06/13/2018 11:37 AM  [facility] [address] [place], [place] [address] [phone]   I hereby voluntarily request, consent and authorize the [facility] and its employed or contracted physicians, physician assistants, nurse practitioners or other licensed health care professionals (the Practitioner), to provide me with telemedicine health care services (the "Services") as deemed necessary by the treating Practitioner. I acknowledge and consent to receive the Services by the Practitioner via telemedicine. I understand that the telemedicine visit will involve communicating with the Practitioner through live audiovisual communication technology and the disclosure of certain medical information by electronic transmission. I acknowledge that I have been given the opportunity to request an in-person assessment or other available alternative prior to the telemedicine visit and am voluntarily participating in the telemedicine visit.   I understand that I have the right to withhold or withdraw my consent to the use of telemedicine in the course of my care at any time, without affecting my right to future care or treatment, and that the Practitioner or I may terminate the telemedicine visit at any time. I understand that I have the right to inspect all information obtained and/or recorded in the course of the telemedicine visit and may receive copies of available  information for a reasonable fee.  I understand that some of the potential risks of receiving the Services via telemedicine include:   Delay or interruption in medical evaluation due to technological equipment failure or disruption;  Information transmitted may not be sufficient (e.g. poor resolution of images) to allow for appropriate medical decision making by the Practitioner; and/or  In rare instances, security protocols could fail, causing a breach of personal health information.   Furthermore, I acknowledge that it is my  responsibility to provide information about my medical history, conditions and care that is complete and accurate to the best of my ability. I acknowledge that Practitioner's advice, recommendations, and/or decision may be based on factors not within their control, such as incomplete or inaccurate data provided by me or distortions of diagnostic images or specimens that may result from electronic transmissions. I understand that the practice of medicine is not an exact science and that Practitioner makes no warranties or guarantees regarding treatment outcomes. I acknowledge that I will receive a copy of this consent concurrently upon execution via email to the email address I last provided but may also request a printed copy by calling the office of the [facility].  I understand that my insurance will be billed for this visit.   I have read or had this consent read to me.  I understand the contents of this consent, which adequately explains the benefits and risks of the Services being provided via telemedicine.  I have been provided ample opportunity to ask questions regarding this consent and the Services and have had my questions answered to my satisfaction.  I give my informed consent for the services to be provided through the use of telemedicine in my medical care  By participating in this telemedicine visit I agree to the above.

## 2018-06-13 NOTE — Telephone Encounter (Signed)
Pt BP 128/70 ; HR 71 after exercise class;   WT 127.5lb Pt reports mild swelling due to venous issue per PCP No SOB, but fatigue with walking Pt living at [facility] and due to shut downs she is unable to go to the pool so feels like she may deconditioned  Pt states that she had a bad reaction to trazadone.  She has since stopped this and is back on restoril  Pt sent remote check yesterday.

## 2018-06-26 ENCOUNTER — Encounter (HOSPITAL_COMMUNITY): Payer: Self-pay

## 2018-06-26 ENCOUNTER — Ambulatory Visit (INDEPENDENT_AMBULATORY_CARE_PROVIDER_SITE_OTHER): Payer: Medicare Other | Admitting: *Deleted

## 2018-06-26 ENCOUNTER — Other Ambulatory Visit: Payer: Self-pay

## 2018-06-26 DIAGNOSIS — I495 Sick sinus syndrome: Secondary | ICD-10-CM

## 2018-06-27 LAB — CUP PACEART REMOTE DEVICE CHECK
Battery Voltage: 2.88 V
Brady Statistic AP VP Percent: 0.58 %
Brady Statistic AP VS Percent: 89.97 %
Brady Statistic AS VP Percent: 0.12 %
Brady Statistic AS VS Percent: 9.34 %
Brady Statistic RA Percent Paced: 90.49 %
Brady Statistic RV Percent Paced: 0.71 %
Date Time Interrogation Session: 20200518165242
Implantable Lead Implant Date: 20101109
Implantable Lead Implant Date: 20101109
Implantable Lead Location: 753859
Implantable Lead Location: 753860
Implantable Lead Model: 4469
Implantable Lead Model: 4470
Implantable Lead Serial Number: 535369
Implantable Lead Serial Number: 657967
Implantable Pulse Generator Implant Date: 20101109
Lead Channel Impedance Value: 448 Ohm
Lead Channel Impedance Value: 456 Ohm
Lead Channel Sensing Intrinsic Amplitude: 0.663 mV
Lead Channel Sensing Intrinsic Amplitude: 10.042 mV
Lead Channel Setting Pacing Amplitude: 2 V
Lead Channel Setting Pacing Amplitude: 2.5 V
Lead Channel Setting Pacing Pulse Width: 0.4 ms
Lead Channel Setting Sensing Sensitivity: 0.9 mV

## 2018-07-04 ENCOUNTER — Encounter: Payer: Self-pay | Admitting: Cardiology

## 2018-07-04 NOTE — Progress Notes (Signed)
Remote pacemaker transmission.   

## 2018-07-07 ENCOUNTER — Other Ambulatory Visit: Payer: Self-pay | Admitting: Cardiovascular Disease

## 2018-07-07 ENCOUNTER — Telehealth: Payer: Self-pay | Admitting: *Deleted

## 2018-07-07 NOTE — Telephone Encounter (Signed)
Spoke with patient and offered visit. She stated she would like to continue to monitor and call back if no improvement.      Please set up for a visit with APP.  ----- Message -----  From: Damian Leavell, RN  Sent: 07/04/2018 12:22 PM EDT  To: Thompson Grayer, MD, [name], MD  Subject: FW: Non-Urgent Medical Question           Sending to Dr. Allred/Dr. Oval Linsey    Please advise    Sonia Baller  ----- Message -----  From: Theodoro Parma, RN  Sent: 07/04/2018 11:53 AM EDT  To: Damian Leavell, RN  Subject: FW: Non-Urgent Medical Question             ----- Message -----  From: [name]  Sent: 07/04/2018 11:11 AM EDT  To: [facility] Triage  Subject: Non-Urgent Medical Question             Good Morning, Jen.    My Primary care increased my Synthroid from 75/50 every other day to 5 of 75 and 2 of 50. After about a week my pulse started going in the 83-97 range except for Saturday night. My thyroid was in the normal range since Jan but a little low and since I did not have any energy he said he would give it a try. I am in sinus just rapid pulse. Had phone visit with Butch Penny with pacemaker check and was fine. I think maybe I should go to Dr. Oval Linsey since I do not have any energy and my arms and legs get really tired when walking (not too far) and exercising. This is very unusual for me although I was very inactive about 2 weeks.  Sorry fr the [employer].  What does Dr. Loni Muse think?

## 2018-07-13 ENCOUNTER — Telehealth: Payer: Self-pay

## 2018-07-13 NOTE — Telephone Encounter (Signed)
Rip Harbour I need to speak to someone about my pulse being anywhere from 83 to 97 yesterday afternoon until now (12:15). My B/P is 110 over 81. My dia is never over 72 to 11. I need to know if I need to take another Toprol or what? Skilar Brannon2037/04/27  Called pt explained BP/medications she will continue to track BP daily and will call back if BP gets into the abnormal range. Pt states thanks and hopes for the best for Dr Oval Linsey

## 2018-07-19 ENCOUNTER — Telehealth (HOSPITAL_COMMUNITY): Payer: Self-pay | Admitting: *Deleted

## 2018-07-19 MED ORDER — METOPROLOL SUCCINATE ER 50 MG PO TB24: ORAL_TABLET | ORAL | 3 refills | Status: DC

## 2018-07-19 NOTE — Telephone Encounter (Signed)
Patient called in stating for the last week her HR has been elevated from her normal. Yesterday she did not feel well. Feeling better today but still concerned regarding HR. Her normal is in the 60s. Over the last week it is consisitently staying in the 80-90s up to the 120s with exercise (which is normally in the 80s with exercise). Tried to have pt send a remote transmission but pt was sure she was in Normal rhythm. Discussed with Roderic Palau NP can try increasing metoprolol to 75mg  in the AM and 50mg  in the PM.

## 2018-07-20 ENCOUNTER — Ambulatory Visit (HOSPITAL_COMMUNITY): Payer: Medicare Other | Admitting: Nurse Practitioner

## 2018-07-21 ENCOUNTER — Telehealth: Payer: Self-pay | Admitting: *Deleted

## 2018-07-21 NOTE — Telephone Encounter (Addendum)
Spoke with patient and she states not Afib just faster HR than normal, upper 80"s   Scheduled in office visit with Dr Margaretann Loveless 08/03/18 as requested  Advised to call back if any further issues before then, verbalized understanding   From: [name]  Sent: 07/17/2018  9:03 AM EDT  To: Cv Div Nl Triage  Subject: Non-Urgent Medical Question             Good morning, Melinda. Hope you enjoyed your time off.  Who is the cardiologist seeing Dr. Blenda Mounts patients while she is on leave? I would like to get an appointment when and if possible rather than seeing a PA.I am not comfortable with the changes I am feeling; especially with my pulse being so fast all the time now. I know the faster your heart beats the more you use it and I am old and do not want it to wear out too soon (lol).  If you could give me a call [phone] would appreciate it.    Netta Neat  [birth year]

## 2018-07-26 NOTE — Telephone Encounter (Signed)
Pt states increase in metoprolol has helped some. Her heart rate is in the 70s now. She has follow up with cardiology next week.

## 2018-08-01 ENCOUNTER — Telehealth: Payer: Self-pay | Admitting: *Deleted

## 2018-08-01 NOTE — Telephone Encounter (Signed)
Spoke with patient --appointment confirmation for 08/03/18 ---reminded patient to bring insurance cards, medication listing, copayment and no visitors or family members to accompany patient

## 2018-08-03 ENCOUNTER — Ambulatory Visit: Payer: Medicare Other | Admitting: Internal Medicine

## 2018-08-03 ENCOUNTER — Encounter: Payer: Self-pay | Admitting: Internal Medicine

## 2018-08-03 ENCOUNTER — Other Ambulatory Visit: Payer: Self-pay

## 2018-08-03 VITALS — BP 110/58 | HR 63 | Temp 96.8°F | Ht 61.0 in | Wt 131.2 lb

## 2018-08-03 DIAGNOSIS — I495 Sick sinus syndrome: Secondary | ICD-10-CM | POA: Diagnosis not present

## 2018-08-03 DIAGNOSIS — Z7901 Long term (current) use of anticoagulants: Secondary | ICD-10-CM

## 2018-08-03 DIAGNOSIS — I480 Paroxysmal atrial fibrillation: Secondary | ICD-10-CM

## 2018-08-03 DIAGNOSIS — Z950 Presence of cardiac pacemaker: Secondary | ICD-10-CM | POA: Diagnosis not present

## 2018-08-03 DIAGNOSIS — Z79899 Other long term (current) drug therapy: Secondary | ICD-10-CM

## 2018-08-03 DIAGNOSIS — I10 Essential (primary) hypertension: Secondary | ICD-10-CM

## 2018-08-03 NOTE — Patient Instructions (Addendum)
Medication Instructions:  NO MEDICATION CHANGES    Lab work:  NOT NEEDED  .  Testing/Procedures:   CONTACT OFFICE  WHEN YOU WOULD LIKE TO DO  ECHOCARDIOGRAM AND STRESS TEST.  HERE IS SOME INFORMATION ABOUT BOTH  Echocardiography is a painless test that uses sound waves to create images of your heart. It provides your doctor with information about the size and shape of your heart and how well your heart's chambers and valves are working. This procedure takes approximately one hour. There are no restrictions for this procedure. AND Your physician has requested that you have a lexiscan myoview. For further information please visit [email or web address]. Please follow instruction sheet, as given.     Follow-Up: At [facility], you and your health needs are our priority.  As part of our continuing mission to provide you with exceptional heart care, we have created designated Provider Care Teams.  These Care Teams include your primary Cardiologist (physician) and Advanced Practice Providers (APPs -  Physician Assistants and Nurse Practitioners) who all work together to provide you with the care you need, when you need it. You will need a follow up appointment in 4 TO 6  Months OCT TO [place].  Please call our office 2 months in advance to schedule this appointment.  You may see May. Any Other Special Instructions Will Be Listed Below (If Applicable).

## 2018-08-04 ENCOUNTER — Ambulatory Visit (INDEPENDENT_AMBULATORY_CARE_PROVIDER_SITE_OTHER): Payer: Medicare Other | Admitting: *Deleted

## 2018-08-04 DIAGNOSIS — Z950 Presence of cardiac pacemaker: Secondary | ICD-10-CM

## 2018-08-06 LAB — CUP PACEART REMOTE DEVICE CHECK
Battery Voltage: 2.88 V
Brady Statistic AP VP Percent: 0.84 %
Brady Statistic AP VS Percent: 39.5 %
Brady Statistic AS VP Percent: 3.77 %
Brady Statistic AS VS Percent: 55.89 %
Brady Statistic RA Percent Paced: 40.17 %
Brady Statistic RV Percent Paced: 4.61 %
Date Time Interrogation Session: 20200626133327
Implantable Lead Implant Date: 20101109
Implantable Lead Implant Date: 20101109
Implantable Lead Location: 753859
Implantable Lead Location: 753860
Implantable Lead Model: 4469
Implantable Lead Model: 4470
Implantable Lead Serial Number: 535369
Implantable Lead Serial Number: 657967
Implantable Pulse Generator Implant Date: 20101109
Lead Channel Impedance Value: 456 Ohm
Lead Channel Impedance Value: 512 Ohm
Lead Channel Sensing Intrinsic Amplitude: 0.486 mV
Lead Channel Sensing Intrinsic Amplitude: 11.381 mV
Lead Channel Setting Pacing Amplitude: 2 V
Lead Channel Setting Pacing Amplitude: 2.5 V
Lead Channel Setting Pacing Pulse Width: 0.4 ms
Lead Channel Setting Sensing Sensitivity: 0.9 mV

## 2018-08-08 NOTE — Progress Notes (Signed)
Cardiology Office Note:    Date:  08/03/2018   ID:  [name], DOB [birth year], MRN [id number]  PCP:  Prince Solian, MD  Cardiologist:  [name], MD  Electrophysiologist:  Thompson Grayer, MD   Referring MD: Prince Solian, MD   Chief Complaint: Mild pulse increase, palpitations, fatigue with activity.  History of Present Illness:    Kathryn Gibson is a 85 y.o. female with a hx of atrial fibrillation, and bradycardia requiring pacemaker implantation. She presents today to have an ECG and discuss mild, brief increase in pulse while remaining with normal rates.   She has recently seen Roderic Palau in [place] [facility], and endorsed for her the same exercise intolerance she describes for me today. She is not able to participate in exercise or use the pool due to COVID 19 restrictions, and feels that perhaps she is losing her strength, but also is worried that she is short of breath with exertion.   No recent AFIB on device recording. No change to plan with afib or device at [facility] per Donna's last note in May. The patient and I discussed this as well as her ECG and reassuring physical exam.   The patient denies chest pain, chest pressure, dyspnea at rest, palpitations, PND, orthopnea, or leg swelling. Denies syncope or presyncope. Denies dizziness or lightheadedness.   Past Medical History:  Diagnosis Date  . Arthritis   . Atrial fibrillation ([place])   . Bradycardia    s/p PPM  . Cataract   . Colonic polyp    adenomatous  . Constipation   . Diverticulosis   . Hemorrhoids   . Hypertension   . Hypothyroidism   . Irritable bowel syndrome (IBS)   . Pacemaker 2010    Past Surgical History:  Procedure Laterality Date  . BREAST CYST ASPIRATION Left 1980s  . COLONOSCOPY    . ELECTROPHYSIOLOGIC STUDY N/A 07/01/2015   Procedure: Atrial Fibrillation Ablation;  Surgeon: Thompson Grayer, MD;  Location: [place] [facility];  Service: Cardiovascular;  Laterality: N/A;  . EYE SURGERY     . HEMATOMA EVACUATION Right 07/15/2014   Procedure: EVACUATION Right Groin Hematoma;  Surgeon: Rosetta Posner, MD;  Location: [place];  Service: Vascular;  Laterality: Right;  . NASAL ENDOSCOPY WITH EPISTAXIS CONTROL N/A 07/14/2014   Procedure: NASAL ENDOSCOPY WITH EPISTAXIS CONTROL;  Surgeon: Ruby Cola, MD;  Location: [place];  Service: ENT;  Laterality: N/A;  . NASAL ENDOSCOPY WITH EPISTAXIS CONTROL Bilateral 07/15/2014   Procedure: NASAL ENDOSCOPY WITH EPISTAXIS CONTROL;  Surgeon: Ruby Cola, MD;  Location: [place];  Service: ENT;  Laterality: Bilateral;  . PACEMAKER INSERTION  2010   implanted by Dr Doreatha Lew (MDT)  . RADIOLOGY WITH ANESTHESIA N/A 07/14/2014   Procedure: RADIOLOGY WITH ANESTHESIA;  Surgeon: Medication Radiologist, MD;  Location: [place];  Service: Radiology;  Laterality: N/A;  . TEE WITHOUT CARDIOVERSION N/A 07/01/2015   Procedure: TRANSESOPHAGEAL ECHOCARDIOGRAM (TEE);  Surgeon: Sueanne Margarita, MD;  Location: [facility] ENDOSCOPY;  Service: Cardiovascular;  Laterality: N/A;    Current Medications: Current Meds  Medication Sig  . acetaminophen (TYLENOL) 500 MG tablet Take 500-1,000 mg by mouth every 6 (six) hours as needed for moderate pain or headache.  Marland Kitchen amiodarone (PACERONE) 200 MG tablet Take 1 tablet (200 mg total) by mouth daily.  . Ascorbic Acid (VITAMIN C) 1000 MG tablet Take 1,000 mg by mouth daily.    . Calcium-Vitamin D (CALTRATE 600 PLUS-VIT D PO) Take 1 tablet by mouth daily.  Marland Kitchen  cholecalciferol (VITAMIN D) 1000 UNITS tablet Take 1,000 Units by mouth daily.    Marland Kitchen diltiazem (CARDIZEM CD) 360 MG 24 hr capsule TAKE 1 CAPSULE BY MOUTH ONCE DAILY  . LYSINE PO Take 1-2 tablets by mouth daily as needed (fever blister).  . Magnesium 250 MG TABS Take by mouth daily.  . metoprolol succinate (TOPROL-XL) 50 MG 24 hr tablet Take 75mg  in the AM and 50mg  in the PM  . Multiple Vitamins-Minerals (ICAPS AREDS 2 PO) Take by mouth daily.  . Omega-3 Fatty Acids (FISH OIL) 1200 MG CAPS Take 1 capsule  by mouth daily. Mega Red.  . polyethylene glycol powder (GLYCOLAX/MIRALAX) powder Take 2 capfuls by mouth daily  . Probiotic Product (ALIGN PO) Take 1 capsule by mouth daily.   . rosuvastatin (CRESTOR) 10 MG tablet Take 1 tablet (10 mg total) by mouth daily.  Marland Kitchen SYNTHROID 50 MCG tablet as directed.  Marland Kitchen SYNTHROID 75 MCG tablet Take 50-75 mcg by mouth daily before breakfast. Alternates between 50 mcg and 75 mcg daily  . temazepam (RESTORIL) 30 MG capsule Take 30 mg by mouth at bedtime.   Alveda Reasons 20 MG TABS tablet TAKE 1 TABLET BY MOUTH ONCE DAILY WITH  SUPPER     Allergies:   Trazodone and nefazodone   Social History   Socioeconomic History  . Marital status: Widowed    Spouse name: Not on file  . Number of children: 3  . Years of education: Not on file  . Highest education level: Not on file  Occupational History  . Occupation: retired  Scientific laboratory technician  . Financial resource strain: Not on file  . Food insecurity    Worry: Not on file    Inability: Not on file  . Transportation needs    Medical: Not on file    Non-medical: Not on file  Tobacco Use  . Smoking status: Former Smoker    Types: Cigarettes    Quit date: 02/09/1980    Years since quitting: 38.5  . Smokeless tobacco: Never Used  Substance and Sexual Activity  . Alcohol use: Yes    Alcohol/week: 14.0 standard drinks    Types: 14 Glasses of wine per week    Comment: wine 6 days a week  . Drug use: No  . Sexual activity: Not on file  Lifestyle  . Physical activity    Days per week: Not on file    Minutes per session: Not on file  . Stress: Not on file  Relationships  . Social Herbalist on phone: Not on file    Gets together: Not on file    Attends religious service: Not on file    Active member of club or organization: Not on file    Attends meetings of clubs or organizations: Not on file    Relationship status: Not on file  Other Topics Concern  . Not on file  Social History Narrative  . Not on  file     Family History: The patient's family history includes CAD in her brother; Congestive Heart Failure in her mother; Dementia in her mother; Diabetes in her paternal grandfather; Heart attack (age of onset: 15) in her father; Hypertension in her mother; Leukemia in her daughter; Lung cancer in her son. There is no history of Colon cancer, Kidney disease, Liver disease, Esophageal cancer, or Stroke.  ROS:   Please see the history of present illness.    All other systems reviewed and are negative.  EKGs/Labs/Other Studies Reviewed:    The following studies were reviewed today:  EKG:  Atrial paced rhythm.    Recent Labs: No results found for requested labs within last 8760 hours.  Recent Lipid Panel    Component Value Date/Time   CHOL 167 10/08/2015 0848   TRIG 56 10/08/2015 0848   HDL 94 10/08/2015 0848   CHOLHDL 1.8 10/08/2015 0848   VLDL 11 10/08/2015 0848   LDLCALC 62 10/08/2015 0848    Physical Exam:    VS:  BP (!) 110/58   Pulse 63   Temp (!) 96.8 F (36 C)   Ht 5\' 1"  (1.549 m)   Wt 131 lb 3.2 oz (59.5 kg)   SpO2 96%   BMI 24.79 kg/m     Wt Readings from Last 5 Encounters:  08/03/18 131 lb 3.2 oz (59.5 kg)  03/30/18 127 lb (57.6 kg)  03/13/18 128 lb 12.8 oz (58.4 kg)  11/18/17 127 lb 12.8 oz (58 kg)  09/07/17 130 lb 12.8 oz (59.3 kg)     Constitutional: No acute distress Eyes: sclera non-icteric, normal conjunctiva and lids ENMT: normal dentition, moist mucous membranes Cardiovascular: regular rhythm, normal rate, no murmurs. S1 and S2 normal. Radial pulses normal bilaterally. No jugular venous distention.  Respiratory: clear to auscultation bilaterally GI : normal bowel sounds, soft and nontender. No distention.   MSK: extremities warm, well perfused. No edema.  NEURO: grossly nonfocal exam, moves all extremities. PSYCH: alert and oriented x 3, normal mood and affect.  ASSESSMENT:    1. PAF (paroxysmal atrial fibrillation) ([place])   2. Sick sinus  syndrome ([place])   3. Essential hypertension   4. Pacemaker   5. On amiodarone therapy   6. Chronic anticoagulation    PLAN:    No concerning findings on ECG or physical exam. However, cannot exclude ischemia or structural concern of the heart. I have offered the patient a stress test and echocardiogram. She would like to think about it and see how she feels in July, to see if she gets her strength back when covid 19 restrictions are lifted. This is reasonable. She can call to schedule testing at any point she feels she is ready. She has known CAD by coronary artery calcifications on previous CT imaging of the pulmonary veins.   She understands and agrees with this plan.    TIME SPENT WITH PATIENT: 25 minutes of direct patient care. More than 50% of that time was spent on coordination of care and counseling regarding afib, ischemia, and coronary artery calcium.  Cherlynn Kaiser, MD [place]  [facility]   Medication Adjustments/Labs and Tests Ordered: Current medicines are reviewed at length with the patient today.  Concerns regarding medicines are outlined above.  Orders Placed This Encounter  Procedures  . EKG 12-Lead   No orders of the defined types were placed in this encounter.   Patient Instructions  Medication Instructions:  NO MEDICATION CHANGES    Lab work:  NOT NEEDED  .  Testing/Procedures:   CONTACT OFFICE  WHEN YOU WOULD LIKE TO DO  ECHOCARDIOGRAM AND STRESS TEST.  HERE IS SOME INFORMATION ABOUT BOTH  Echocardiography is a painless test that uses sound waves to create images of your heart. It provides your doctor with information about the size and shape of your heart and how well your heart's chambers and valves are working. This procedure takes approximately one hour. There are no restrictions for this procedure. AND Your physician has requested that  you have a lexiscan myoview. For further information please visit [email or web address]. Please follow  instruction sheet, as given.     Follow-Up: At [facility], you and your health needs are our priority.  As part of our continuing mission to provide you with exceptional heart care, we have created designated Provider Care Teams.  These Care Teams include your primary Cardiologist (physician) and Advanced Practice Providers (APPs -  Physician Assistants and Nurse Practitioners) who all work together to provide you with the care you need, when you need it. You will need a follow up appointment in 4 TO 6  Months OCT TO [place].  Please call our office 2 months in advance to schedule this appointment.  You may see [place]. Any Other Special Instructions Will Be Listed Below (If Applicable).

## 2018-08-09 ENCOUNTER — Encounter: Payer: Self-pay | Admitting: Cardiology

## 2018-08-09 ENCOUNTER — Other Ambulatory Visit (HOSPITAL_COMMUNITY): Payer: Self-pay | Admitting: *Deleted

## 2018-08-09 MED ORDER — METOPROLOL SUCCINATE ER 50 MG PO TB24: ORAL_TABLET | ORAL | 3 refills | Status: DC

## 2018-08-09 NOTE — Progress Notes (Signed)
Remote pacemaker transmission.   

## 2018-08-09 NOTE — Addendum Note (Signed)
Addended by: Tiajuana Amass on: 08/09/2018 05:54 PM   Modules accepted: Level of Service

## 2018-09-04 ENCOUNTER — Ambulatory Visit (INDEPENDENT_AMBULATORY_CARE_PROVIDER_SITE_OTHER): Payer: Medicare Other | Admitting: *Deleted

## 2018-09-04 DIAGNOSIS — I495 Sick sinus syndrome: Secondary | ICD-10-CM

## 2018-09-04 DIAGNOSIS — I480 Paroxysmal atrial fibrillation: Secondary | ICD-10-CM

## 2018-09-04 LAB — CUP PACEART REMOTE DEVICE CHECK
Battery Voltage: 2.88 V
Brady Statistic AP VP Percent: 1.14 %
Brady Statistic AP VS Percent: 40.82 %
Brady Statistic AS VP Percent: 1.83 %
Brady Statistic AS VS Percent: 56.21 %
Brady Statistic RA Percent Paced: 41.95 %
Brady Statistic RV Percent Paced: 3.01 %
Date Time Interrogation Session: 20200727170429
Implantable Lead Implant Date: 20101109
Implantable Lead Implant Date: 20101109
Implantable Lead Location: 753859
Implantable Lead Location: 753860
Implantable Lead Model: 4469
Implantable Lead Model: 4470
Implantable Lead Serial Number: 535369
Implantable Lead Serial Number: 657967
Implantable Pulse Generator Implant Date: 20101109
Lead Channel Impedance Value: 448 Ohm
Lead Channel Impedance Value: 456 Ohm
Lead Channel Sensing Intrinsic Amplitude: 0.221 mV
Lead Channel Sensing Intrinsic Amplitude: 10.042 mV
Lead Channel Setting Pacing Amplitude: 2 V
Lead Channel Setting Pacing Amplitude: 2.5 V
Lead Channel Setting Pacing Pulse Width: 0.4 ms
Lead Channel Setting Sensing Sensitivity: 0.9 mV

## 2018-09-18 NOTE — Progress Notes (Signed)
Remote pacemaker transmission.   

## 2018-10-05 ENCOUNTER — Ambulatory Visit (INDEPENDENT_AMBULATORY_CARE_PROVIDER_SITE_OTHER): Payer: Medicare Other | Admitting: *Deleted

## 2018-10-05 DIAGNOSIS — I4891 Unspecified atrial fibrillation: Secondary | ICD-10-CM

## 2018-10-05 DIAGNOSIS — I495 Sick sinus syndrome: Secondary | ICD-10-CM

## 2018-10-05 LAB — CUP PACEART REMOTE DEVICE CHECK
Battery Voltage: 2.86 V
Brady Statistic AP VP Percent: 2.97 %
Brady Statistic AP VS Percent: 46.36 %
Brady Statistic AS VP Percent: 4.94 %
Brady Statistic AS VS Percent: 45.72 %
Brady Statistic RA Percent Paced: 49.3 %
Brady Statistic RV Percent Paced: 8.04 %
Date Time Interrogation Session: 20200827093339
Implantable Lead Implant Date: 20101109
Implantable Lead Implant Date: 20101109
Implantable Lead Location: 753859
Implantable Lead Location: 753860
Implantable Lead Model: 4469
Implantable Lead Model: 4470
Implantable Lead Serial Number: 535369
Implantable Lead Serial Number: 657967
Implantable Pulse Generator Implant Date: 20101109
Lead Channel Impedance Value: 440 Ohm
Lead Channel Impedance Value: 520 Ohm
Lead Channel Sensing Intrinsic Amplitude: 0.442 mV
Lead Channel Sensing Intrinsic Amplitude: 9.372 mV
Lead Channel Setting Pacing Amplitude: 2 V
Lead Channel Setting Pacing Amplitude: 2.5 V
Lead Channel Setting Pacing Pulse Width: 0.4 ms
Lead Channel Setting Sensing Sensitivity: 0.9 mV

## 2018-10-10 NOTE — Progress Notes (Signed)
Remote pacemaker transmission.   

## 2018-10-11 ENCOUNTER — Telehealth: Payer: Self-pay | Admitting: Pharmacist Clinician (PhC)/ Clinical Pharmacy Specialist

## 2018-10-11 MED ORDER — RIVAROXABAN 15 MG PO TABS: 15.0000 mg | ORAL_TABLET | Freq: Every day | ORAL | 1 refills | Status: DC

## 2018-10-11 NOTE — Telephone Encounter (Signed)
Patient on Xarelto 20 mg daily.  Originally started with 15 mg dose, but at one time, CrCl increased to above 50 and patient dose was increased to 20 mg daily.   Based on current information (age 85, wt 59.5 kg and SCr  1.0 - kpn)  CrCl 40.4.  Significantly low enough that she should be now on the 15 mg tablet again.    Spoke with patient and she is aware that her next refill will be lower dose.

## 2018-10-12 DIAGNOSIS — Z23 Encounter for immunization: Secondary | ICD-10-CM | POA: Diagnosis not present

## 2018-10-17 DIAGNOSIS — I10 Essential (primary) hypertension: Secondary | ICD-10-CM | POA: Diagnosis not present

## 2018-10-17 DIAGNOSIS — I2510 Atherosclerotic heart disease of native coronary artery without angina pectoris: Secondary | ICD-10-CM | POA: Diagnosis not present

## 2018-10-17 DIAGNOSIS — I480 Paroxysmal atrial fibrillation: Secondary | ICD-10-CM | POA: Diagnosis not present

## 2018-10-17 DIAGNOSIS — Z950 Presence of cardiac pacemaker: Secondary | ICD-10-CM | POA: Diagnosis not present

## 2018-11-02 ENCOUNTER — Ambulatory Visit (INDEPENDENT_AMBULATORY_CARE_PROVIDER_SITE_OTHER): Payer: Medicare Other | Admitting: *Deleted

## 2018-11-02 DIAGNOSIS — I495 Sick sinus syndrome: Secondary | ICD-10-CM

## 2018-11-02 LAB — CUP PACEART REMOTE DEVICE CHECK
Battery Voltage: 2.86 V
Brady Statistic AP VP Percent: 0.65 %
Brady Statistic AP VS Percent: 38.99 %
Brady Statistic AS VP Percent: 2.35 %
Brady Statistic AS VS Percent: 58.01 %
Brady Statistic RA Percent Paced: 39.56 %
Brady Statistic RV Percent Paced: 3.03 %
Date Time Interrogation Session: 20200924123418
Implantable Lead Implant Date: 20101109
Implantable Lead Implant Date: 20101109
Implantable Lead Location: 753859
Implantable Lead Location: 753860
Implantable Lead Model: 4469
Implantable Lead Model: 4470
Implantable Lead Serial Number: 535369
Implantable Lead Serial Number: 657967
Implantable Pulse Generator Implant Date: 20101109
Lead Channel Impedance Value: 448 Ohm
Lead Channel Impedance Value: 536 Ohm
Lead Channel Sensing Intrinsic Amplitude: 0.353 mV
Lead Channel Sensing Intrinsic Amplitude: 10.376 mV
Lead Channel Setting Pacing Amplitude: 2 V
Lead Channel Setting Pacing Amplitude: 2.5 V
Lead Channel Setting Pacing Pulse Width: 0.4 ms
Lead Channel Setting Sensing Sensitivity: 0.9 mV

## 2018-11-08 ENCOUNTER — Encounter: Payer: Self-pay | Admitting: Cardiology

## 2018-11-08 NOTE — Progress Notes (Signed)
Remote pacemaker transmission.   

## 2018-11-10 ENCOUNTER — Other Ambulatory Visit: Payer: Self-pay | Admitting: Obstetrics and Gynecology

## 2018-11-10 DIAGNOSIS — Z1231 Encounter for screening mammogram for malignant neoplasm of breast: Secondary | ICD-10-CM

## 2018-12-04 ENCOUNTER — Other Ambulatory Visit (HOSPITAL_COMMUNITY): Payer: Self-pay | Admitting: Cardiovascular Disease

## 2018-12-04 ENCOUNTER — Ambulatory Visit (INDEPENDENT_AMBULATORY_CARE_PROVIDER_SITE_OTHER): Payer: Medicare Other | Admitting: *Deleted

## 2018-12-04 DIAGNOSIS — I495 Sick sinus syndrome: Secondary | ICD-10-CM

## 2018-12-04 DIAGNOSIS — I480 Paroxysmal atrial fibrillation: Secondary | ICD-10-CM

## 2018-12-04 LAB — CUP PACEART REMOTE DEVICE CHECK
Battery Voltage: 2.86 V
Brady Statistic AP VP Percent: 0.6 %
Brady Statistic AP VS Percent: 46.85 %
Brady Statistic AS VP Percent: 3.21 %
Brady Statistic AS VS Percent: 49.35 %
Brady Statistic RA Percent Paced: 47.4 %
Brady Statistic RV Percent Paced: 3.83 %
Date Time Interrogation Session: 20201026161056
Implantable Lead Implant Date: 20101109
Implantable Lead Implant Date: 20101109
Implantable Lead Location: 753859
Implantable Lead Location: 753860
Implantable Lead Model: 4469
Implantable Lead Model: 4470
Implantable Lead Serial Number: 535369
Implantable Lead Serial Number: 657967
Implantable Pulse Generator Implant Date: 20101109
Lead Channel Impedance Value: 472 Ohm
Lead Channel Impedance Value: 480 Ohm
Lead Channel Sensing Intrinsic Amplitude: 0.353 mV
Lead Channel Sensing Intrinsic Amplitude: 11.381 mV
Lead Channel Setting Pacing Amplitude: 2 V
Lead Channel Setting Pacing Amplitude: 2.5 V
Lead Channel Setting Pacing Pulse Width: 0.4 ms
Lead Channel Setting Sensing Sensitivity: 0.9 mV

## 2018-12-08 DIAGNOSIS — D1801 Hemangioma of skin and subcutaneous tissue: Secondary | ICD-10-CM | POA: Diagnosis not present

## 2018-12-08 DIAGNOSIS — K130 Diseases of lips: Secondary | ICD-10-CM | POA: Diagnosis not present

## 2018-12-08 DIAGNOSIS — L821 Other seborrheic keratosis: Secondary | ICD-10-CM | POA: Diagnosis not present

## 2018-12-08 DIAGNOSIS — D225 Melanocytic nevi of trunk: Secondary | ICD-10-CM | POA: Diagnosis not present

## 2018-12-21 NOTE — Progress Notes (Signed)
Remote pacemaker transmission.   

## 2018-12-27 ENCOUNTER — Ambulatory Visit
Admission: RE | Admit: 2018-12-27 | Discharge: 2018-12-27 | Disposition: A | Discharge status: Home / Self Care | Payer: Medicare Other | Source: Ambulatory Visit | Attending: Obstetrics and Gynecology | Admitting: Obstetrics and Gynecology

## 2018-12-27 ENCOUNTER — Other Ambulatory Visit: Payer: Self-pay

## 2018-12-27 DIAGNOSIS — Z1231 Encounter for screening mammogram for malignant neoplasm of breast: Secondary | ICD-10-CM | POA: Diagnosis not present

## 2019-01-08 ENCOUNTER — Ambulatory Visit (INDEPENDENT_AMBULATORY_CARE_PROVIDER_SITE_OTHER): Payer: Medicare Other | Admitting: *Deleted

## 2019-01-08 DIAGNOSIS — I495 Sick sinus syndrome: Secondary | ICD-10-CM | POA: Diagnosis not present

## 2019-01-08 DIAGNOSIS — R945 Abnormal results of liver function studies: Secondary | ICD-10-CM | POA: Diagnosis not present

## 2019-01-08 LAB — CUP PACEART REMOTE DEVICE CHECK
Battery Voltage: 2.85 V
Brady Statistic AP VP Percent: 0.55 %
Brady Statistic AP VS Percent: 71.33 %
Brady Statistic AS VP Percent: 0.18 %
Brady Statistic AS VS Percent: 27.94 %
Brady Statistic RA Percent Paced: 71.87 %
Brady Statistic RV Percent Paced: 0.73 %
Date Time Interrogation Session: 20201130135128
Implantable Lead Implant Date: 20101109
Implantable Lead Implant Date: 20101109
Implantable Lead Location: 753859
Implantable Lead Location: 753860
Implantable Lead Model: 4469
Implantable Lead Model: 4470
Implantable Lead Serial Number: 535369
Implantable Lead Serial Number: 657967
Implantable Pulse Generator Implant Date: 20101109
Lead Channel Impedance Value: 456 Ohm
Lead Channel Impedance Value: 496 Ohm
Lead Channel Sensing Intrinsic Amplitude: 0.574 mV
Lead Channel Sensing Intrinsic Amplitude: 9.707 mV
Lead Channel Setting Pacing Amplitude: 2 V
Lead Channel Setting Pacing Amplitude: 2.5 V
Lead Channel Setting Pacing Pulse Width: 0.4 ms
Lead Channel Setting Sensing Sensitivity: 0.9 mV

## 2019-01-09 DIAGNOSIS — I10 Essential (primary) hypertension: Secondary | ICD-10-CM | POA: Diagnosis not present

## 2019-01-09 DIAGNOSIS — E038 Other specified hypothyroidism: Secondary | ICD-10-CM | POA: Diagnosis not present

## 2019-01-10 DIAGNOSIS — I10 Essential (primary) hypertension: Secondary | ICD-10-CM | POA: Diagnosis not present

## 2019-01-12 ENCOUNTER — Other Ambulatory Visit: Payer: Self-pay | Admitting: Internal Medicine

## 2019-01-12 DIAGNOSIS — R945 Abnormal results of liver function studies: Secondary | ICD-10-CM

## 2019-01-12 DIAGNOSIS — R7989 Other specified abnormal findings of blood chemistry: Secondary | ICD-10-CM

## 2019-01-12 DIAGNOSIS — R944 Abnormal results of kidney function studies: Secondary | ICD-10-CM

## 2019-01-12 DIAGNOSIS — I10 Essential (primary) hypertension: Secondary | ICD-10-CM

## 2019-01-22 ENCOUNTER — Other Ambulatory Visit: Payer: Medicare Other

## 2019-01-22 ENCOUNTER — Ambulatory Visit
Admission: RE | Admit: 2019-01-22 | Discharge: 2019-01-22 | Disposition: A | Discharge status: Home / Self Care | Payer: Medicare Other | Source: Ambulatory Visit | Attending: Internal Medicine | Admitting: Internal Medicine

## 2019-01-22 DIAGNOSIS — I10 Essential (primary) hypertension: Secondary | ICD-10-CM

## 2019-01-22 DIAGNOSIS — R7989 Other specified abnormal findings of blood chemistry: Secondary | ICD-10-CM

## 2019-01-22 DIAGNOSIS — R944 Abnormal results of kidney function studies: Secondary | ICD-10-CM

## 2019-01-22 DIAGNOSIS — R945 Abnormal results of liver function studies: Secondary | ICD-10-CM

## 2019-01-23 ENCOUNTER — Ambulatory Visit
Admission: RE | Admit: 2019-01-23 | Discharge: 2019-01-23 | Disposition: A | Discharge status: Home / Self Care | Payer: Medicare Other | Source: Ambulatory Visit | Attending: Internal Medicine | Admitting: Internal Medicine

## 2019-01-23 DIAGNOSIS — R7989 Other specified abnormal findings of blood chemistry: Secondary | ICD-10-CM | POA: Diagnosis not present

## 2019-01-23 DIAGNOSIS — R945 Abnormal results of liver function studies: Secondary | ICD-10-CM | POA: Diagnosis not present

## 2019-01-23 DIAGNOSIS — R944 Abnormal results of kidney function studies: Secondary | ICD-10-CM

## 2019-01-25 ENCOUNTER — Other Ambulatory Visit: Payer: Self-pay

## 2019-01-25 ENCOUNTER — Encounter: Payer: Self-pay | Admitting: Cardiovascular Disease

## 2019-01-25 ENCOUNTER — Ambulatory Visit: Payer: Medicare Other | Admitting: Cardiovascular Disease

## 2019-01-25 VITALS — BP 120/67 | HR 61 | Temp 98.8°F | Ht 61.5 in | Wt 127.8 lb

## 2019-01-25 DIAGNOSIS — I2510 Atherosclerotic heart disease of native coronary artery without angina pectoris: Secondary | ICD-10-CM | POA: Diagnosis not present

## 2019-01-25 DIAGNOSIS — I480 Paroxysmal atrial fibrillation: Secondary | ICD-10-CM

## 2019-01-25 DIAGNOSIS — I10 Essential (primary) hypertension: Secondary | ICD-10-CM

## 2019-01-25 DIAGNOSIS — R0609 Other forms of dyspnea: Secondary | ICD-10-CM

## 2019-01-25 DIAGNOSIS — R0600 Dyspnea, unspecified: Secondary | ICD-10-CM

## 2019-01-25 MED ORDER — PRAVASTATIN SODIUM 10 MG PO TABS: 10.0000 mg | ORAL_TABLET | Freq: Every day | ORAL | 3 refills | Status: DC

## 2019-01-25 NOTE — Progress Notes (Signed)
Cardiology Office Note   Date:  01/25/2019   ID:  Kathryn Gibson, DOB February 07, 1934, MRN [id number]  PCP:  Prince Solian, MD  Cardiologist:   [name], MD  Electrophysiologist: Thompson Grayer  No chief complaint on file.    History of Present Illness: Kathryn Gibson is a 85 y.o. female with atrial fibrillation s/p ablation, SSS s/p PPM, asymptomatic coronary calcifications, and  hypertension, who presents for follow-up.  Kathryn Gibson  was previously a patient of Dr. Mare Ferrari.  She underwent ablation for atrial fibrillation on 07/01/15.  She last had an episode of atrial fibrillation 12/2015.  She has been doing well and rarely has palpitations that don't last very long.  She is nearing ERI and her PPM batter is followed monthly.  She continues to exercise nearly every day per week.  She has no exertional symptoms.  She has mild edema attributable to varicose veins that is better when she uses compression stockings.  She has no orthopnea or PND.  She last saw Dr. Rayann Heman 03/2018 and was doing well.   Amiodarone was previously increased due to atrial tachycardia.  She saw Dr. Margaretann Loveless 07/2018 and reported exertional dyspnea.  Dr. Margaretann Loveless offered stress testing which she wanted to think about.  Lately she hasn't been experiencing chest pain but does get short of breath at times.  While the pool was closed she wasn't very active.  She gets short of breath walking at times but never when swimming or doing yoga.  The longer she walks, the better she feels.  She sometimes notes that her legs feel like lead.  Her device was last interrogated 01/08/19 and was nearing ERI.  There were no episodes of atrial fibrillation noted.    Past Medical History:  Diagnosis Date  . Arthritis   . Atrial fibrillation ([place])   . Bradycardia    s/p PPM  . Cataract   . Colonic polyp    adenomatous  . Constipation   . Diverticulosis   . Hemorrhoids   . Hypertension   . Hypothyroidism   . Irritable bowel  syndrome (IBS)   . Pacemaker 2010    Past Surgical History:  Procedure Laterality Date  . BREAST CYST ASPIRATION Left 1980s  . COLONOSCOPY    . ELECTROPHYSIOLOGIC STUDY N/A 07/01/2015   Procedure: Atrial Fibrillation Ablation;  Surgeon: Thompson Grayer, MD;  Location: [place] [facility];  Service: Cardiovascular;  Laterality: N/A;  . EYE SURGERY    . HEMATOMA EVACUATION Right 07/15/2014   Procedure: EVACUATION Right Groin Hematoma;  Surgeon: Rosetta Posner, MD;  Location: [place];  Service: Vascular;  Laterality: Right;  . NASAL ENDOSCOPY WITH EPISTAXIS CONTROL N/A 07/14/2014   Procedure: NASAL ENDOSCOPY WITH EPISTAXIS CONTROL;  Surgeon: Ruby Cola, MD;  Location: [place];  Service: ENT;  Laterality: N/A;  . NASAL ENDOSCOPY WITH EPISTAXIS CONTROL Bilateral 07/15/2014   Procedure: NASAL ENDOSCOPY WITH EPISTAXIS CONTROL;  Surgeon: Ruby Cola, MD;  Location: [place];  Service: ENT;  Laterality: Bilateral;  . PACEMAKER INSERTION  2010   implanted by Dr Doreatha Lew (MDT)  . RADIOLOGY WITH ANESTHESIA N/A 07/14/2014   Procedure: RADIOLOGY WITH ANESTHESIA;  Surgeon: Medication Radiologist, MD;  Location: [place];  Service: Radiology;  Laterality: N/A;  . TEE WITHOUT CARDIOVERSION N/A 07/01/2015   Procedure: TRANSESOPHAGEAL ECHOCARDIOGRAM (TEE);  Surgeon: Sueanne Margarita, MD;  Location: [facility]OSCOPY;  Service: Cardiovascular;  Laterality: N/A;     Current Outpatient Medications  Medication Sig Dispense  Refill  . acetaminophen (TYLENOL) 500 MG tablet Take 500-1,000 mg by mouth every 6 (six) hours as needed for moderate pain or headache.    Marland Kitchen amiodarone (PACERONE) 200 MG tablet Take 1 tablet (200 mg total) by mouth daily. 90 tablet 3  . Ascorbic Acid (VITAMIN C) 1000 MG tablet Take 1,000 mg by mouth daily.      . Calcium-Vitamin D (CALTRATE 600 PLUS-VIT D PO) Take 1 tablet by mouth daily.    . cholecalciferol (VITAMIN D) 1000 UNITS tablet Take 1,000 Units by mouth daily.      Marland Kitchen diltiazem (CARDIZEM CD) 360 MG 24 hr  capsule Take 1 capsule by mouth once daily 30 capsule 2  . LYSINE PO Take 1-2 tablets by mouth daily as needed (fever blister).    . Magnesium 250 MG TABS Take by mouth daily.    . metoprolol succinate (TOPROL-XL) 50 MG 24 hr tablet Take 75mg  in the AM and 50mg  in the PM 225 tablet 3  . Multiple Vitamins-Minerals (ICAPS AREDS 2 PO) Take by mouth daily.    . Omega-3 Fatty Acids (FISH OIL) 1200 MG CAPS Take 1 capsule by mouth daily. Mega Red.    . polyethylene glycol powder (GLYCOLAX/MIRALAX) powder Take 2 capfuls by mouth daily  0  . Probiotic Product (ALIGN PO) Take 1 capsule by mouth daily.     . Rivaroxaban (XARELTO) 15 MG TABS tablet Take 1 tablet (15 mg total) by mouth daily with supper. 90 tablet 1  . rosuvastatin (CRESTOR) 10 MG tablet Take 1 tablet (10 mg total) by mouth daily. 90 tablet 3  . SYNTHROID 50 MCG tablet as directed.  7  . SYNTHROID 75 MCG tablet Take 50-75 mcg by mouth daily before breakfast. Alternates between 50 mcg and 75 mcg daily  2  . temazepam (RESTORIL) 30 MG capsule Take 30 mg by mouth at bedtime.      No current facility-administered medications for this visit.    Allergies:   Trazodone and nefazodone    Social History:  The patient  reports that she quit smoking about 38 years ago. Her smoking use included cigarettes. She has never used smokeless tobacco. She reports current alcohol use of about 14.0 standard drinks of alcohol per week. She reports that she does not use drugs.   Family History:  The patient's family history includes CAD in her brother; Congestive Heart Failure in her mother; Dementia in her mother; Diabetes in her paternal grandfather; Heart attack (age of onset: 72) in her father; Hypertension in her mother; Leukemia in her daughter; Lung cancer in her son.    ROS:  Please see the history of present illness.   Otherwise, review of systems are positive for none.   All other systems are reviewed and negative.    PHYSICAL EXAM: VS:  BP 120/67    Pulse 61   Temp 98.8 F (37.1 C)   Ht 5' 1.5" (1.562 m)   Wt 127 lb 12.8 oz (58 kg)   SpO2 94%   BMI 23.76 kg/m  , BMI Body mass index is 23.76 kg/m. GENERAL:  Well appearing HEENT: Pupils equal round and reactive, fundi not visualized, oral mucosa unremarkable NECK:  No jugular venous distention, waveform within normal limits, carotid upstroke brisk and symmetric, no bruits LUNGS:  Clear to auscultation bilaterally HEART:  RRR.  PMI not displaced or sustained,S1 and S2 within normal limits, no S3, no S4, no clicks, no rubs, no murmurs ABD:  Flat, positive  bowel sounds normal in frequency in pitch, no bruits, no rebound, no guarding, no midline pulsatile mass, no hepatomegaly, no splenomegaly EXT:  2 plus pulses throughout, no edema, no cyanosis no clubbing SKIN:  No rashes no nodules NEURO:  Cranial nerves II through XII grossly intact, motor grossly intact throughout PSYCH:  Cognitively intact, oriented to person place and time   EKG:  EKG is ordered today. 12/07/16: AP VS.  Rate 65 bpm.  Non-specific t wave abnormalities.   11/18/17: AP VS.  Prolonged AV delay.  Non-specific t wave abnormalities.  01/25/19: APVS.  Long AV delay.  Rate 61 bpm.    Echo 06/05/15: Study Conclusions  - Left ventricle: The cavity size was normal. Wall thickness was  normal. Systolic function was normal. The estimated ejection  fraction was in the range of 55% to 60%. Wall motion was normal;  there were no regional wall motion abnormalities. - Aortic valve: Trileaflet; mildly thickened, mildly calcified  leaflets. There was mild regurgitation. - Mitral valve: Calcified annulus. - Left atrium: The atrium was mildly dilated. - Right ventricle: Pacer wire or catheter noted in right ventricle.  Impressions:  - Compared to the prior study, there has been no significant  interval change.   Recent Labs: No results found for requested labs within last 8760 hours.  02/28/15: TSH 7.24, free  T4 130  04/02/16: Sodium 136, potassium 4.3, BUN 22, creatinine 0.8 WBC 6.91, hemoglobin, 12.9, hematocrit 41.2, platelets 208 Total cholesterol 163, triglycerides 45, HDL 70, LDL 84 TSH 2.12, free T4 1.3  03/23/2017:  Total cholesterol 161, triglycerides 46, HDL 73, LDL 79 ALT 25  Lipid Panel    Component Value Date/Time   CHOL 167 10/08/2015 0848   TRIG 56 10/08/2015 0848   HDL 94 10/08/2015 0848   CHOLHDL 1.8 10/08/2015 0848   VLDL 11 10/08/2015 0848   LDLCALC 62 10/08/2015 0848      Wt Readings from Last 3 Encounters:  01/25/19 127 lb 12.8 oz (58 kg)  08/03/18 131 lb 3.2 oz (59.5 kg)  03/30/18 127 lb (57.6 kg)     ASSESSMENT AND PLAN:  # Atrial fibrillation:  Ms. Zeldin remains in sinus rhythm.  She is still on amiodarone.  This also helped her atrial tachycardia.  PFTs were stable 06/2017.   Continue Xarelto, amiodarone, and metoprolol..  This patients CHA2DS2-VASc Score and unadjusted Ischemic Stroke Rate (% per year) is equal to 4.8 % stroke rate/year from a score of 4 Above score calculated as 1 point each if present [CHF, HTN, DM, Vascular=MI/PAD/Aortic Plaque, Age if 65-74, or Female] Above score calculated as 2 points each if present [Age > 75, or Stroke/TIA/TE]  # Hyperlipidemia: ASCVD 10 year risk is 34%.  She reports myalgias with rosuvastatin.  We will switch to pravastatin 10 mg daily.  Repeat lipids/CMP in 6-8 weeks.   # Asymptomatic coronary calcification: Asymptomatic.  She exercises regularly.  Given that she has no chest pain or shortness of breath swimming and that her symptoms improve the farther she walks, no ischemia evaluation at this time. Continue Xarelto and switch to pravastatin a above.    Current medicines are reviewed at length with the patient today.  The patient does not have concerns regarding medicines.  The following changes have been made:  None  Time spent: 35 minutes-Greater than 50% of this time was spent in counseling, explanation  of diagnosis, planning of further management, and coordination of care.   Labs/ tests ordered today include:  No orders of the defined types were placed in this encounter.   Disposition:   FU with Willmer Fellers C. Oval Linsey, MD, [facility] in 6 months.     Signed, Petrice Beedy C. Oval Linsey, MD, [facility]  01/25/2019 1:42 PM    [facility]

## 2019-01-25 NOTE — Patient Instructions (Signed)
Medication Instructions:  STOP ROSUVASTATIN   START PRAVASTATIN 10 MG DAILY  *If you need a refill on your cardiac medications before your next appointment, please call your pharmacy*  Lab Work: [place]  If you have labs (blood work) drawn today and your tests are completely normal, you will receive your results only by: Marland Kitchen MyChart Message (if you have MyChart) OR . A paper copy in the mail If you have any lab test that is abnormal or we need to change your treatment, we will call you to review the results.  Testing/Procedures: Your physician has requested that you have a lexiscan myoview. For further information please visit [email or web address]. Please follow instruction sheet, as given. CALL THE OFFICE WHEN YOU ARE READY TO SCHEDULE   Follow-Up: At [facility], you and your health needs are our priority.  As part of our continuing mission to provide you with exceptional heart care, we have created designated Provider Care Teams.  These Care Teams include your primary Cardiologist (physician) and Advanced Practice Providers (APPs -  Physician Assistants and Nurse Practitioners) who all work together to provide you with the care you need, when you need it.  Your next appointment:   6 month(s)  The format for your next appointment:   Either In Person or Virtual  Provider:   You may see [name], MD or one of the following Advanced Practice Providers on your designated Care Team:    Kerin Ransom, PA-C  [place], [place]  Coletta Memos, [place]

## 2019-01-31 NOTE — Progress Notes (Signed)
PPM remote 

## 2019-02-08 ENCOUNTER — Ambulatory Visit (INDEPENDENT_AMBULATORY_CARE_PROVIDER_SITE_OTHER): Payer: Medicare Other | Admitting: *Deleted

## 2019-02-08 DIAGNOSIS — I495 Sick sinus syndrome: Secondary | ICD-10-CM

## 2019-02-08 LAB — CUP PACEART REMOTE DEVICE CHECK
Battery Voltage: 2.85 V
Brady Statistic AP VP Percent: 0.39 %
Brady Statistic AP VS Percent: 80.21 %
Brady Statistic AS VP Percent: 0.03 %
Brady Statistic AS VS Percent: 19.37 %
Brady Statistic RA Percent Paced: 80.58 %
Brady Statistic RV Percent Paced: 0.43 %
Date Time Interrogation Session: 20201231131436
Implantable Lead Implant Date: 20101109
Implantable Lead Implant Date: 20101109
Implantable Lead Location: 753859
Implantable Lead Location: 753860
Implantable Lead Model: 4469
Implantable Lead Model: 4470
Implantable Lead Serial Number: 535369
Implantable Lead Serial Number: 657967
Implantable Pulse Generator Implant Date: 20101109
Lead Channel Impedance Value: 456 Ohm
Lead Channel Impedance Value: 456 Ohm
Lead Channel Sensing Intrinsic Amplitude: 0.442 mV
Lead Channel Sensing Intrinsic Amplitude: 10.042 mV
Lead Channel Setting Pacing Amplitude: 2 V
Lead Channel Setting Pacing Amplitude: 2.5 V
Lead Channel Setting Pacing Pulse Width: 0.4 ms
Lead Channel Setting Sensing Sensitivity: 0.9 mV

## 2019-02-15 DIAGNOSIS — Z961 Presence of intraocular lens: Secondary | ICD-10-CM | POA: Diagnosis not present

## 2019-02-15 DIAGNOSIS — H35372 Puckering of macula, left eye: Secondary | ICD-10-CM | POA: Diagnosis not present

## 2019-02-15 DIAGNOSIS — H04123 Dry eye syndrome of bilateral lacrimal glands: Secondary | ICD-10-CM | POA: Diagnosis not present

## 2019-02-20 ENCOUNTER — Encounter: Payer: Self-pay | Admitting: Cardiovascular Disease

## 2019-03-07 ENCOUNTER — Other Ambulatory Visit (HOSPITAL_COMMUNITY): Payer: Self-pay | Admitting: Cardiovascular Disease

## 2019-03-07 ENCOUNTER — Other Ambulatory Visit: Payer: Self-pay

## 2019-03-07 MED ORDER — DILTIAZEM HCL ER COATED BEADS 360 MG PO CP24: 360.0000 mg | ORAL_CAPSULE | Freq: Every day | ORAL | 3 refills | Status: DC

## 2019-03-12 ENCOUNTER — Ambulatory Visit (INDEPENDENT_AMBULATORY_CARE_PROVIDER_SITE_OTHER): Payer: Medicare Other | Admitting: *Deleted

## 2019-03-12 DIAGNOSIS — I495 Sick sinus syndrome: Secondary | ICD-10-CM | POA: Diagnosis not present

## 2019-03-13 LAB — CUP PACEART REMOTE DEVICE CHECK
Battery Voltage: 2.83 V
Brady Statistic AP VP Percent: 1.07 %
Brady Statistic AP VS Percent: 65.12 %
Brady Statistic AS VP Percent: 0.08 %
Brady Statistic AS VS Percent: 33.73 %
Brady Statistic RA Percent Paced: 66.19 %
Brady Statistic RV Percent Paced: 1.16 %
Date Time Interrogation Session: 20210202105706
Implantable Lead Implant Date: 20101109
Implantable Lead Implant Date: 20101109
Implantable Lead Location: 753859
Implantable Lead Location: 753860
Implantable Lead Model: 4469
Implantable Lead Model: 4470
Implantable Lead Serial Number: 535369
Implantable Lead Serial Number: 657967
Implantable Pulse Generator Implant Date: 20101109
Lead Channel Impedance Value: 440 Ohm
Lead Channel Impedance Value: 480 Ohm
Lead Channel Sensing Intrinsic Amplitude: 0.221 mV
Lead Channel Sensing Intrinsic Amplitude: 10.042 mV
Lead Channel Setting Pacing Amplitude: 2 V
Lead Channel Setting Pacing Amplitude: 2.5 V
Lead Channel Setting Pacing Pulse Width: 0.4 ms
Lead Channel Setting Sensing Sensitivity: 0.9 mV

## 2019-03-14 NOTE — Addendum Note (Signed)
Addended by: Jennette Banker on: 03/14/2019 07:17 AM   Modules accepted: Level of Service

## 2019-03-20 ENCOUNTER — Encounter: Payer: Medicare Other | Admitting: Physician Assistant

## 2019-03-20 NOTE — Progress Notes (Addendum)
Cardiology Office Note Date:  03/22/2019  Patient ID:  Kathryn Gibson, Kathryn Gibson April 10, 1933, MRN [id number] PCP:  Prince Solian, MD  Cardiologist:  Dr. Oval Linsey Electrophysiologist: Dr. Rayann Heman    Chief Complaint: annual devic/EP visit  History of Present Illness: Kathryn Gibson is a 86 y.o. female with history of AFib, bradycardia w/PPM, HTN, hypothyroidism.  She comes in today to be seen for Dr. Rayann Heman, last seen by him Feb 2020, at that time she was on amiodarone 200mg  BID with recent Atach and was again back in SR and dose reduced to daily with plans to try and titrate her back to 100mg  daily.  Since then it looks like she has had an Afib  Clinic and cardiology visit with some subjective feelings of increased pulse, unusual DOE, fatiigue, device checked without arrhythmia, was nearing ERI.  She was offered to get some w/u done for this though looks like she wanted to hold off.  She is doing OK.  She lives at [employer] and with the onset of COVID restrictions, she was spending all of her time in her home, unable to be out and about, all her usual exercise activities stopped including pool, yoga, everything.  She has recently been back at some exercise and feels like she gets winded much quicker and more fatigued afterwards.  She feels like she has good exertion in the pool, but other activities not as much.  She can walk up the stairs too her apartmentand does not have to stop, but is winded.  No CP, notices her heart gets faster then it did before with exercise.   No dizzy spells, near syncope or syncope.  She denies any bleeding or signs of bleeding  She has labd regularly with her PMD, mentions that her last labs told her kidney and liver function was a little abnormal and scheduled for f/u labs on Friday.  She was not told to stop or change any of her medicines.  AFib hx 07/01/2015: PVI ablation, including Mitral isthmus and Cavo-tricuspid isthmus ablation noting Multiple atypical  atrial flutter circuits observed too unstable for mapping  Has had an ATach AAD  Amiodarone goes back at least to 2012 (as far as I can find notes), remains active drug for her  Device information  MDT dual chamber PPM implanted 12/17/2008  Past Medical History:  Diagnosis Date  . Arthritis   . Atrial fibrillation ([place])   . Bradycardia    s/p PPM  . Cataract   . Colonic polyp    adenomatous  . Constipation   . Diverticulosis   . Hemorrhoids   . Hypertension   . Hypothyroidism   . Irritable bowel syndrome (IBS)   . Pacemaker 2010    Past Surgical History:  Procedure Laterality Date  . BREAST CYST ASPIRATION Left 1980s  . COLONOSCOPY    . ELECTROPHYSIOLOGIC STUDY N/A 07/01/2015   Procedure: Atrial Fibrillation Ablation;  Surgeon: Thompson Grayer, MD;  Location: [place] [facility];  Service: Cardiovascular;  Laterality: N/A;  . EYE SURGERY    . HEMATOMA EVACUATION Right 07/15/2014   Procedure: EVACUATION Right Groin Hematoma;  Surgeon: Rosetta Posner, MD;  Location: [place];  Service: Vascular;  Laterality: Right;  . NASAL ENDOSCOPY WITH EPISTAXIS CONTROL N/A 07/14/2014   Procedure: NASAL ENDOSCOPY WITH EPISTAXIS CONTROL;  Surgeon: Ruby Cola, MD;  Location: [place];  Service: ENT;  Laterality: N/A;  . NASAL ENDOSCOPY WITH EPISTAXIS CONTROL Bilateral 07/15/2014   Procedure: NASAL ENDOSCOPY WITH EPISTAXIS  CONTROL;  Surgeon: Ruby Cola, MD;  Location: [place];  Service: ENT;  Laterality: Bilateral;  . PACEMAKER INSERTION  2010   implanted by Dr Doreatha Lew (MDT)  . RADIOLOGY WITH ANESTHESIA N/A 07/14/2014   Procedure: RADIOLOGY WITH ANESTHESIA;  Surgeon: Medication Radiologist, MD;  Location: [place];  Service: Radiology;  Laterality: N/A;  . TEE WITHOUT CARDIOVERSION N/A 07/01/2015   Procedure: TRANSESOPHAGEAL ECHOCARDIOGRAM (TEE);  Surgeon: Sueanne Margarita, MD;  Location: [facility] ENDOSCOPY;  Service: Cardiovascular;  Laterality: N/A;    Current Outpatient Medications  Medication Sig Dispense Refill    . acetaminophen (TYLENOL) 500 MG tablet Take 500-1,000 mg by mouth every 6 (six) hours as needed for moderate pain or headache.    Marland Kitchen amiodarone (PACERONE) 200 MG tablet Take 1 tablet (200 mg total) by mouth daily. 90 tablet 3  . Ascorbic Acid (VITAMIN C) 1000 MG tablet Take 1,000 mg by mouth daily.      . Calcium-Vitamin D (CALTRATE 600 PLUS-VIT D PO) Take 1 tablet by mouth daily.    . cholecalciferol (VITAMIN D) 1000 UNITS tablet Take 1,000 Units by mouth daily.      Marland Kitchen diltiazem (CARDIZEM CD) 360 MG 24 hr capsule Take 1 capsule (360 mg total) by mouth daily. 90 capsule 3  . LYSINE PO Take 1-2 tablets by mouth daily as needed (fever blister).    . Magnesium 250 MG TABS Take by mouth daily.    . metoprolol succinate (TOPROL-XL) 50 MG 24 hr tablet Take 75mg  in the AM and 50mg  in the PM 225 tablet 3  . Multiple Vitamins-Minerals (ICAPS AREDS 2 PO) Take by mouth daily.    . Omega-3 Fatty Acids (FISH OIL) 1200 MG CAPS Take 1 capsule by mouth daily. Mega Red.    . polyethylene glycol powder (GLYCOLAX/MIRALAX) powder Take 2 capfuls by mouth daily  0  . pravastatin (PRAVACHOL) 10 MG tablet Take 1 tablet (10 mg total) by mouth daily. 90 tablet 3  . Probiotic Product (ALIGN PO) Take 1 capsule by mouth daily.     . Rivaroxaban (XARELTO) 15 MG TABS tablet Take 1 tablet (15 mg total) by mouth daily with supper. 90 tablet 1  . SYNTHROID 50 MCG tablet as directed.  7  . SYNTHROID 75 MCG tablet Take 50-75 mcg by mouth daily before breakfast. Alternates between 50 mcg and 75 mcg daily  2  . temazepam (RESTORIL) 30 MG capsule Take 30 mg by mouth at bedtime.     . Zinc 50 MG CAPS Take 550 mcg by mouth daily.     No current facility-administered medications for this visit.    Allergies:   Trazodone and nefazodone   Social History:  The patient  reports that she quit smoking about 39 years ago. Her smoking use included cigarettes. She has never used smokeless tobacco. She reports current alcohol use of about  14.0 standard drinks of alcohol per week. She reports that she does not use drugs.   Family History:  The patient's family history includes CAD in her brother; Congestive Heart Failure in her mother; Dementia in her mother; Diabetes in her paternal grandfather; Heart attack (age of onset: 79) in her father; Hypertension in her mother; Leukemia in her daughter; Lung cancer in her son.  ROS:  Please see the history of present illness.    All other systems are reviewed and otherwise negative.   PHYSICAL EXAM:  VS:  BP 138/78   Pulse 76   Ht 5' 1.5" (1.562  m)   Wt 122 lb (55.3 kg)   BMI 22.68 kg/m  BMI: Body mass index is 22.68 kg/m. Well nourished, well developed, in no acute distress  HEENT: normocephalic, atraumatic  Neck: no JVD, carotid bruits or masses Cardiac:  RRR; no significant murmurs, no rubs, or gallops Lungs:  CTA b/l, no wheezing, rhonchi or rales  Abd: soft, nontender MS: no deformity oratrophy Ext: she has tall support stockings on, no edema is appreciated  Skin: warm and dry, no rash Neuro:  No gross deficits appreciated Psych: euthymic mood, full affect  PPM site (R side) is stable, no tethering or discomfort   EKG:  Not done today  PPM interrogation done today and reviewed by myself;  Battery is at 2.83V (ERI is 2.81) Lead measurements are good No arrhythmias or AMS AP 59.2% VP 40.9%   07/01/2015; EPA/Ablation, Dr. Rayann Heman CONCLUSIONS: 1. Atrial paced rhythm upon presentation.   2. Successful electrical isolation and anatomical encircling of all four pulmonary veins with radiofrequency current and a WACA approach.  Additional left atrial ablation was performed to create a standard box lesion along the posterior wall of the left atrium. 3. Multiple atypical atrial flutter circuits observed too unstable for mapping today.  Mitral isthmus and Cavo-tricuspid isthmus ablation performed today 4. Successful cardioversion to sinus rhythm 5. No early apparent  complications   [id number]: TTE Study Conclusions  - Left ventricle: The cavity size was normal. Wall thickness was  normal. Systolic function was normal. The estimated ejection  fraction was in the range of 55% to 60%. Wall motion was normal;  there were no regional wall motion abnormalities.  - Aortic valve: Trileaflet; mildly thickened, mildly calcified  leaflets. There was mild regurgitation.  - Mitral valve: Calcified annulus.  - Left atrium: The atrium was mildly dilated.  - Right ventricle: Pacer wire or catheter noted in right ventricle.   Impressions:  - Compared to the prior study, there has been no significant  interval change.   Recent Labs: No results found for requested labs within last 8760 hours.  No results found for requested labs within last 8760 hours.   CrCl cannot be calculated (Patient's most recent lab result is older than the maximum 21 days allowed.).   Wt Readings from Last 3 Encounters:  03/22/19 122 lb (55.3 kg)  01/25/19 127 lb 12.8 oz (58 kg)  08/03/18 131 lb 3.2 oz (59.5 kg)     Other studies reviewed: Additional studies/records reviewed today include: summarized above  ASSESSMENT AND PLAN:  1. PPM     Intact function, nearing ERI     She is doing monthly battery checks  We discussed generator change procedure, potential risks and beneofts Will plan to see her in 56mo   2. Paroxysmal AFib     Has had an Atach as well     CHA2DS2vasc is 4, on xarelto      On amiodarone     <0.1% burden  We have requested labs from her PMD   3. HTN     She is quite worried about today's BP     Looks OK, she reports is always 120's/low 70's     No changes today   4. DOE     I suspect this is deconditioning 2/2 lack of ability to exercise for a prolonged period of time as usual with COVID restrictions     Follow as she is able to get back to exercise, encouraged to start slow and advance  as tolerated towards her usual regime.         Disposition: as above  Current medicines are reviewed at length with the patient today.  The patient did not have any concerns regarding medicines.  Venetia Night, PA-C 03/22/2019 4:52 PM     [place] [address] [place] [address] [phone] (office)  [phone] (fax)

## 2019-03-22 ENCOUNTER — Ambulatory Visit: Payer: Medicare Other | Admitting: Physician Assistant

## 2019-03-22 ENCOUNTER — Other Ambulatory Visit: Payer: Self-pay

## 2019-03-22 VITALS — BP 138/78 | HR 76 | Ht 61.5 in | Wt 122.0 lb

## 2019-03-22 DIAGNOSIS — I10 Essential (primary) hypertension: Secondary | ICD-10-CM

## 2019-03-22 DIAGNOSIS — Z950 Presence of cardiac pacemaker: Secondary | ICD-10-CM | POA: Diagnosis not present

## 2019-03-22 DIAGNOSIS — I480 Paroxysmal atrial fibrillation: Secondary | ICD-10-CM

## 2019-03-22 NOTE — Patient Instructions (Signed)
Medication Instructions:  Your physician recommends that you continue on your current medications as directed. Please refer to the Current Medication list given to you today.  *If you need a refill on your cardiac medications before your next appointment, please call your pharmacy*  Lab Work: [place]   If you have labs (blood work) drawn today and your tests are completely normal, you will receive your results only by: Marland Kitchen MyChart Message (if you have MyChart) OR . A paper copy in the mail If you have any lab test that is abnormal or we need to change your treatment, we will call you to review the results.  Testing/Procedures: NONE ORDERED  TODAY  Follow-Up: At [facility], you and your health needs are our priority.  As part of our continuing mission to provide you with exceptional heart care, we have created designated Provider Care Teams.  These Care Teams include your primary Cardiologist (physician) and Advanced Practice Providers (APPs -  Physician Assistants and Nurse Practitioners) who all work together to provide you with the care you need, when you need it.  Your next appointment:   3 month(s)  The format for your next appointment:   In Person  Provider:   You may see Thompson Grayer, MD or one of the following Advanced Practice Providers on your designated Care Team:    Chanetta Marshall, NP  Tommye Standard, PA-C  Legrand Como "Oda [place], [place]   Other Instructions

## 2019-03-23 DIAGNOSIS — R945 Abnormal results of liver function studies: Secondary | ICD-10-CM | POA: Diagnosis not present

## 2019-03-26 DIAGNOSIS — Z7901 Long term (current) use of anticoagulants: Secondary | ICD-10-CM | POA: Diagnosis not present

## 2019-03-26 DIAGNOSIS — I480 Paroxysmal atrial fibrillation: Secondary | ICD-10-CM | POA: Diagnosis not present

## 2019-03-29 DIAGNOSIS — I2510 Atherosclerotic heart disease of native coronary artery without angina pectoris: Secondary | ICD-10-CM | POA: Diagnosis not present

## 2019-03-29 DIAGNOSIS — R945 Abnormal results of liver function studies: Secondary | ICD-10-CM | POA: Diagnosis not present

## 2019-03-29 DIAGNOSIS — I10 Essential (primary) hypertension: Secondary | ICD-10-CM | POA: Diagnosis not present

## 2019-03-29 DIAGNOSIS — I480 Paroxysmal atrial fibrillation: Secondary | ICD-10-CM | POA: Diagnosis not present

## 2019-03-29 NOTE — Telephone Encounter (Signed)
The patient is well known to me   She is on amiodarone and has recently had elevation in LFTs.  I have spoken with Dr Dagmar Hait today.  We agree together to stop amiodarone. Of note, she has not had any prolonged arrhythmias by PPM review today since at least 2017.  We will follow remotely and consider a different AAD vs ablation should her arrhythmia burden increase.  Dr Dagmar Hait will repeat her LFTs in about 6 weeks.

## 2019-04-06 ENCOUNTER — Telehealth: Payer: Self-pay | Admitting: *Deleted

## 2019-04-06 NOTE — Telephone Encounter (Signed)
Spoke with patient to see if any medications changes were made from PMD especially with Amiodarone due lab results. Patient stated PMD  talked to Dr. Rayann Heman and agreed to stop Amiodarone. Amiodarone.Medicatin removed from list

## 2019-04-10 DIAGNOSIS — J310 Chronic rhinitis: Secondary | ICD-10-CM | POA: Diagnosis not present

## 2019-04-11 ENCOUNTER — Encounter: Payer: Self-pay | Admitting: Internal Medicine

## 2019-04-11 ENCOUNTER — Other Ambulatory Visit: Payer: Self-pay

## 2019-04-11 ENCOUNTER — Ambulatory Visit: Payer: Medicare Other | Admitting: Internal Medicine

## 2019-04-11 VITALS — BP 120/70 | HR 80 | Temp 97.6°F | Ht 60.5 in | Wt 126.4 lb

## 2019-04-11 DIAGNOSIS — R159 Full incontinence of feces: Secondary | ICD-10-CM | POA: Diagnosis not present

## 2019-04-11 DIAGNOSIS — K624 Stenosis of anus and rectum: Secondary | ICD-10-CM

## 2019-04-11 DIAGNOSIS — K5900 Constipation, unspecified: Secondary | ICD-10-CM | POA: Diagnosis not present

## 2019-04-11 NOTE — Progress Notes (Signed)
HISTORY OF PRESENT ILLNESS:  Kathryn Gibson is a 86 y.o. female with past medical history as listed below who presents today for evaluation of fecal incontinence.  Patient has a history of constipation predominant irritable bowel syndrome and adenomatous colon polyps.  Last evaluated June 2019.  See that dictation.  Last colonoscopy 2013.  She reports a 2-year history of fecal incontinence.  She has the urge to defecate but cannot control her bowels.  Incontinence occurs when the bowel is loose.  At other times she describes painful constipated bowel movements in the region of the rectum.  She has been using a little more than 2 doses of MiraLAX daily.  She does not wear protective undergarments.  She has symptoms about once every 2 weeks.  This is affecting her social life.  She also mentions to me that she has elevated liver test likely related to amiodarone which is being followed by Dr. Dagmar Hait.  Review of outside blood work from January 09, 2019 shows normal electrolytes and thyroid studies.  Abdominal ultrasound January 23, 2019 revealed a small cystic lesion of the pancreatic head but was otherwise unremarkable.  The liver was said to be slightly coarsened.  No focal lesion  REVIEW OF SYSTEMS:  All non-GI ROS negative unless otherwise stated in the HPI except for sleeping problems Past Medical History:  Diagnosis Date  . Arthritis   . Atrial fibrillation ([place])   . Bradycardia    s/p PPM  . Cataract   . Colonic polyp    adenomatous  . Constipation   . Diverticulosis   . Hemorrhoids   . Hypertension   . Hypothyroidism   . Irritable bowel syndrome (IBS)   . Pacemaker 2010    Past Surgical History:  Procedure Laterality Date  . BREAST CYST ASPIRATION Left 1980s  . COLONOSCOPY    . ELECTROPHYSIOLOGIC STUDY N/A 07/01/2015   Procedure: Atrial Fibrillation Ablation;  Surgeon: Thompson Grayer, MD;  Location: [place] [facility];  Service: Cardiovascular;  Laterality: N/A;  . EYE SURGERY    .  HEMATOMA EVACUATION Right 07/15/2014   Procedure: EVACUATION Right Groin Hematoma;  Surgeon: Rosetta Posner, MD;  Location: [place];  Service: Vascular;  Laterality: Right;  . NASAL ENDOSCOPY WITH EPISTAXIS CONTROL N/A 07/14/2014   Procedure: NASAL ENDOSCOPY WITH EPISTAXIS CONTROL;  Surgeon: Ruby Cola, MD;  Location: [place];  Service: ENT;  Laterality: N/A;  . NASAL ENDOSCOPY WITH EPISTAXIS CONTROL Bilateral 07/15/2014   Procedure: NASAL ENDOSCOPY WITH EPISTAXIS CONTROL;  Surgeon: Ruby Cola, MD;  Location: [place];  Service: ENT;  Laterality: Bilateral;  . PACEMAKER INSERTION  2010   implanted by Dr Doreatha Lew (MDT)  . RADIOLOGY WITH ANESTHESIA N/A 07/14/2014   Procedure: RADIOLOGY WITH ANESTHESIA;  Surgeon: Medication Radiologist, MD;  Location: [place];  Service: Radiology;  Laterality: N/A;  . TEE WITHOUT CARDIOVERSION N/A 07/01/2015   Procedure: TRANSESOPHAGEAL ECHOCARDIOGRAM (TEE);  Surgeon: Sueanne Margarita, MD;  Location: [facility] ENDOSCOPY;  Service: Cardiovascular;  Laterality: N/A;    Social History Kathryn Gibson  reports that she quit smoking about 39 years ago. Her smoking use included cigarettes. She has never used smokeless tobacco. She reports current alcohol use of about 14.0 standard drinks of alcohol per week. She reports that she does not use drugs.  family history includes CAD in her brother; Congestive Heart Failure in her mother; Dementia in her mother; Diabetes in her paternal grandfather; Heart attack (age of onset: 60) in her father; Hypertension  in her mother; Leukemia in her daughter; Lung cancer in her son.  Allergies  Allergen Reactions  . Trazodone And Nefazodone     "twitching" and trembling       PHYSICAL EXAMINATION: Vital signs: BP 120/70 (BP Location: Left Arm, Patient Position: Sitting, Cuff Size: Normal)   Pulse 80   Temp 97.6 F (36.4 C)   Ht 5' 0.5" (1.537 m) Comment: height measured without shoes  Wt 126 lb 6 oz (57.3 kg)   BMI 24.27 kg/m   Constitutional:  generally well-appearing, no acute distress Psychiatric: alert and oriented x3, cooperative Eyes: extraocular movements intact, anicteric, conjunctiva pink Mouth: oral pharynx moist, no lesions Neck: supple no lymphadenopathy Cardiovascular: heart regular rate and rhythm, no murmur Lungs: clear to auscultation bilaterally Abdomen: soft, nontender, nondistended, no obvious ascites, no peritoneal signs, normal bowel sounds, no organomegaly Rectal: No external abnormalities.  There is anal stenosis.  Hemoccult negative stool.  Very poor rectal tone Extremities: no clubbing, cyanosis, or lower extremity edema bilaterally Skin: no lesions on visible extremities Neuro: No focal deficits.  Cranial nerves intact.  No asterixis  ASSESSMENT:  1.  Fecal incontinence secondary to poor rectal tone 2.  Anal stenosis 3.  Chronic constipation 4.  History of adenomatous colon polyps.  Aged out of surveillance 5.  Multiple medical problems 6.  History of elevated liver tests being evaluated elsewhere   PLAN:  1.  Recommended Citrucel 2 tablespoons daily to improve bowel consistency 2.  Recommended protective undergarments such as depends 3.  Happy to assist with liver test abnormalities if needed.  She is aware 4.  Return to the care of her PCP.  GI follow-up as needed A total time of 30 minutes was spent preparing to see the patient, reviewing tests and data, obtaining history, performing comprehensive physical exam, counseling and educating the patient on her above listed issues, ordering medications or therapies, and documenting information in the clinical health record

## 2019-04-11 NOTE — Patient Instructions (Signed)
Take 2 tablespoons of citrucel daily.  Make sure to wear protective undergarments

## 2019-04-12 ENCOUNTER — Ambulatory Visit (INDEPENDENT_AMBULATORY_CARE_PROVIDER_SITE_OTHER): Payer: Medicare Other | Admitting: *Deleted

## 2019-04-12 DIAGNOSIS — I495 Sick sinus syndrome: Secondary | ICD-10-CM

## 2019-04-12 LAB — CUP PACEART REMOTE DEVICE CHECK
Battery Voltage: 2.83 V
Brady Statistic AP VP Percent: 0.87 %
Brady Statistic AP VS Percent: 47.79 %
Brady Statistic AS VP Percent: 0.03 %
Brady Statistic AS VS Percent: 51.31 %
Brady Statistic RA Percent Paced: 48.66 %
Brady Statistic RV Percent Paced: 0.91 %
Date Time Interrogation Session: 20210304110410
Implantable Lead Implant Date: 20101109
Implantable Lead Implant Date: 20101109
Implantable Lead Location: 753859
Implantable Lead Location: 753860
Implantable Lead Model: 4469
Implantable Lead Model: 4470
Implantable Lead Serial Number: 535369
Implantable Lead Serial Number: 657967
Implantable Pulse Generator Implant Date: 20101109
Lead Channel Impedance Value: 448 Ohm
Lead Channel Impedance Value: 496 Ohm
Lead Channel Sensing Intrinsic Amplitude: 0.486 mV
Lead Channel Sensing Intrinsic Amplitude: 10.711 mV
Lead Channel Setting Pacing Amplitude: 2 V
Lead Channel Setting Pacing Amplitude: 2.5 V
Lead Channel Setting Pacing Pulse Width: 0.4 ms
Lead Channel Setting Sensing Sensitivity: 0.9 mV

## 2019-04-12 NOTE — Progress Notes (Signed)
PPM Remote  

## 2019-04-17 DIAGNOSIS — R69 Illness, unspecified: Secondary | ICD-10-CM | POA: Diagnosis not present

## 2019-04-24 DIAGNOSIS — E038 Other specified hypothyroidism: Secondary | ICD-10-CM | POA: Diagnosis not present

## 2019-04-24 DIAGNOSIS — M859 Disorder of bone density and structure, unspecified: Secondary | ICD-10-CM | POA: Diagnosis not present

## 2019-04-24 DIAGNOSIS — E7849 Other hyperlipidemia: Secondary | ICD-10-CM | POA: Diagnosis not present

## 2019-04-24 DIAGNOSIS — Z0000 Encounter for general adult medical examination without abnormal findings: Secondary | ICD-10-CM | POA: Diagnosis not present

## 2019-05-01 DIAGNOSIS — Z7189 Other specified counseling: Secondary | ICD-10-CM | POA: Diagnosis not present

## 2019-05-01 DIAGNOSIS — Z0000 Encounter for general adult medical examination without abnormal findings: Secondary | ICD-10-CM | POA: Diagnosis not present

## 2019-05-01 DIAGNOSIS — I10 Essential (primary) hypertension: Secondary | ICD-10-CM | POA: Diagnosis not present

## 2019-05-01 DIAGNOSIS — I2510 Atherosclerotic heart disease of native coronary artery without angina pectoris: Secondary | ICD-10-CM | POA: Diagnosis not present

## 2019-05-01 DIAGNOSIS — Z1331 Encounter for screening for depression: Secondary | ICD-10-CM | POA: Diagnosis not present

## 2019-05-03 DIAGNOSIS — I10 Essential (primary) hypertension: Secondary | ICD-10-CM | POA: Diagnosis not present

## 2019-05-03 DIAGNOSIS — R82998 Other abnormal findings in urine: Secondary | ICD-10-CM | POA: Diagnosis not present

## 2019-05-04 DIAGNOSIS — Z1212 Encounter for screening for malignant neoplasm of rectum: Secondary | ICD-10-CM | POA: Diagnosis not present

## 2019-05-04 LAB — IFOBT (OCCULT BLOOD): IFOBT: NEGATIVE

## 2019-05-14 ENCOUNTER — Ambulatory Visit (INDEPENDENT_AMBULATORY_CARE_PROVIDER_SITE_OTHER): Payer: Medicare Other | Admitting: *Deleted

## 2019-05-14 DIAGNOSIS — I495 Sick sinus syndrome: Secondary | ICD-10-CM

## 2019-05-15 LAB — CUP PACEART REMOTE DEVICE CHECK
Battery Voltage: 2.82 V
Brady Statistic AP VP Percent: 0.88 %
Brady Statistic AP VS Percent: 57.46 %
Brady Statistic AS VP Percent: 2.1 %
Brady Statistic AS VS Percent: 39.57 %
Brady Statistic RA Percent Paced: 58.3 %
Brady Statistic RV Percent Paced: 2.98 %
Date Time Interrogation Session: 20210406115230
Implantable Lead Implant Date: 20101109
Implantable Lead Implant Date: 20101109
Implantable Lead Location: 753859
Implantable Lead Location: 753860
Implantable Lead Model: 4469
Implantable Lead Model: 4470
Implantable Lead Serial Number: 535369
Implantable Lead Serial Number: 657967
Implantable Pulse Generator Implant Date: 20101109
Lead Channel Impedance Value: 464 Ohm
Lead Channel Impedance Value: 512 Ohm
Lead Channel Sensing Intrinsic Amplitude: 0.353 mV
Lead Channel Sensing Intrinsic Amplitude: 10.376 mV
Lead Channel Setting Pacing Amplitude: 2 V
Lead Channel Setting Pacing Amplitude: 2.5 V
Lead Channel Setting Pacing Pulse Width: 0.4 ms
Lead Channel Setting Sensing Sensitivity: 0.9 mV

## 2019-05-18 ENCOUNTER — Other Ambulatory Visit: Payer: Self-pay

## 2019-05-31 DIAGNOSIS — H16143 Punctate keratitis, bilateral: Secondary | ICD-10-CM | POA: Diagnosis not present

## 2019-05-31 DIAGNOSIS — H5712 Ocular pain, left eye: Secondary | ICD-10-CM | POA: Diagnosis not present

## 2019-05-31 DIAGNOSIS — H1045 Other chronic allergic conjunctivitis: Secondary | ICD-10-CM | POA: Diagnosis not present

## 2019-05-31 DIAGNOSIS — H04123 Dry eye syndrome of bilateral lacrimal glands: Secondary | ICD-10-CM | POA: Diagnosis not present

## 2019-06-05 DIAGNOSIS — Z0120 Encounter for dental examination and cleaning without abnormal findings: Secondary | ICD-10-CM | POA: Diagnosis not present

## 2019-06-14 ENCOUNTER — Ambulatory Visit (INDEPENDENT_AMBULATORY_CARE_PROVIDER_SITE_OTHER): Payer: Medicare Other | Admitting: *Deleted

## 2019-06-14 DIAGNOSIS — I495 Sick sinus syndrome: Secondary | ICD-10-CM

## 2019-06-14 LAB — CUP PACEART REMOTE DEVICE CHECK
Battery Voltage: 2.82 V
Brady Statistic AP VP Percent: 0.27 %
Brady Statistic AP VS Percent: 76.66 %
Brady Statistic AS VP Percent: 1.57 %
Brady Statistic AS VS Percent: 21.51 %
Brady Statistic RA Percent Paced: 76.89 %
Brady Statistic RV Percent Paced: 1.84 %
Date Time Interrogation Session: 20210506114100
Implantable Lead Implant Date: 20101109
Implantable Lead Implant Date: 20101109
Implantable Lead Location: 753859
Implantable Lead Location: 753860
Implantable Lead Model: 4469
Implantable Lead Model: 4470
Implantable Lead Serial Number: 535369
Implantable Lead Serial Number: 657967
Implantable Pulse Generator Implant Date: 20101109
Lead Channel Impedance Value: 440 Ohm
Lead Channel Impedance Value: 480 Ohm
Lead Channel Sensing Intrinsic Amplitude: 0.265 mV
Lead Channel Sensing Intrinsic Amplitude: 9.037 mV
Lead Channel Setting Pacing Amplitude: 2 V
Lead Channel Setting Pacing Amplitude: 2.5 V
Lead Channel Setting Pacing Pulse Width: 0.4 ms
Lead Channel Setting Sensing Sensitivity: 0.9 mV

## 2019-06-14 NOTE — Progress Notes (Signed)
Remote pacemaker transmission.   

## 2019-06-19 DIAGNOSIS — Z0120 Encounter for dental examination and cleaning without abnormal findings: Secondary | ICD-10-CM | POA: Diagnosis not present

## 2019-06-20 ENCOUNTER — Ambulatory Visit: Payer: Medicare Other | Admitting: Internal Medicine

## 2019-06-20 ENCOUNTER — Encounter: Payer: Self-pay | Admitting: Internal Medicine

## 2019-06-20 ENCOUNTER — Other Ambulatory Visit: Payer: Self-pay

## 2019-06-20 VITALS — BP 132/70 | HR 63 | Ht 60.0 in | Wt 126.0 lb

## 2019-06-20 DIAGNOSIS — I480 Paroxysmal atrial fibrillation: Secondary | ICD-10-CM

## 2019-06-20 DIAGNOSIS — I119 Hypertensive heart disease without heart failure: Secondary | ICD-10-CM

## 2019-06-20 DIAGNOSIS — I495 Sick sinus syndrome: Secondary | ICD-10-CM

## 2019-06-20 DIAGNOSIS — D6869 Other thrombophilia: Secondary | ICD-10-CM | POA: Diagnosis not present

## 2019-06-20 NOTE — Progress Notes (Signed)
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  Since last being seen in our clinic, the patient reports doing very well.  Today, she denies symptoms of palpitations, chest pain, shortness of breath,  lower extremity edema, dizziness, presyncope, or syncope.  The patient is otherwise without complaint today.   Past Medical History:  Diagnosis Date  . Arthritis   . Atrial fibrillation ([place])   . Bradycardia    s/p PPM  . Cataract   . Colonic polyp    adenomatous  . Constipation   . Diverticulosis   . Hemorrhoids   . Hypertension   . Hypothyroidism   . Irritable bowel syndrome (IBS)   . Pacemaker 2010   Past Surgical History:  Procedure Laterality Date  . BREAST CYST ASPIRATION Left 1980s  . COLONOSCOPY    . ELECTROPHYSIOLOGIC STUDY N/A 07/01/2015   Procedure: Atrial Fibrillation Ablation;  Surgeon: Thompson Grayer, MD;  Location: [place] [facility];  Service: Cardiovascular;  Laterality: N/A;  . EYE SURGERY    . HEMATOMA EVACUATION Right 07/15/2014   Procedure: EVACUATION Right Groin Hematoma;  Surgeon: Rosetta Posner, MD;  Location: [place];  Service: Vascular;  Laterality: Right;  . NASAL ENDOSCOPY WITH EPISTAXIS CONTROL N/A 07/14/2014   Procedure: NASAL ENDOSCOPY WITH EPISTAXIS CONTROL;  Surgeon: Ruby Cola, MD;  Location: [place];  Service: ENT;  Laterality: N/A;  . NASAL ENDOSCOPY WITH EPISTAXIS CONTROL Bilateral 07/15/2014   Procedure: NASAL ENDOSCOPY WITH EPISTAXIS CONTROL;  Surgeon: Ruby Cola, MD;  Location: [place];  Service: ENT;  Laterality: Bilateral;  . PACEMAKER INSERTION  2010   implanted by Dr Doreatha Lew (MDT)  . RADIOLOGY WITH ANESTHESIA N/A 07/14/2014   Procedure: RADIOLOGY WITH ANESTHESIA;  Surgeon: Medication Radiologist, MD;  Location: [place];  Service: Radiology;  Laterality: N/A;  . TEE WITHOUT CARDIOVERSION N/A 07/01/2015   Procedure: TRANSESOPHAGEAL ECHOCARDIOGRAM  (TEE);  Surgeon: Sueanne Margarita, MD;  Location[facility] ENDOSCOPY;  Service: Cardiovascular;  Laterality: N/A;    ROS- all systems are reviewed and negative except as per HPI above  Current Outpatient Medications  Medication Sig Dispense Refill  . acetaminophen (TYLENOL) 500 MG tablet Take 500-1,000 mg by mouth every 6 (six) hours as needed for moderate pain or headache.    . Ascorbic Acid (VITAMIN C) 1000 MG tablet Take 1,000 mg by mouth daily.      . Calcium-Vitamin D (CALTRATE 600 PLUS-VIT D PO) Take 1 tablet by mouth daily.    . cholecalciferol (VITAMIN D) 1000 UNITS tablet Take 1,000 Units by mouth daily.      . Coenzyme Q10 (CO Q-10 PO) Take 1 tablet by mouth daily.    Marland Kitchen diltiazem (CARDIZEM CD) 360 MG 24 hr capsule Take 1 capsule (360 mg total) by mouth daily. 90 capsule 3  . LYSINE PO Take 1-2 tablets by mouth daily as needed (fever blister).    . metoprolol succinate (TOPROL-XL) 50 MG 24 hr tablet Take 75mg  in the AM and 50mg  in the PM 225 tablet 3  . Multiple Vitamins-Minerals (ICAPS AREDS 2 PO) Take by mouth daily.    . Omega-3 Fatty Acids (FISH OIL) 1200 MG CAPS Take 1 capsule by mouth daily. Mega Red.    . polyethylene glycol powder (GLYCOLAX/MIRALAX) powder Take 2 capfuls by mouth daily  0  . pravastatin (PRAVACHOL) 10 MG tablet Take 1 tablet (10 mg  total) by mouth daily. 90 tablet 3  . Probiotic Product (ALIGN PO) Take 1 capsule by mouth daily.     . Rivaroxaban (XARELTO) 15 MG TABS tablet Take 1 tablet (15 mg total) by mouth daily with supper. 90 tablet 1  . SYNTHROID 50 MCG tablet as directed.  7  . SYNTHROID 75 MCG tablet Take 50-75 mcg by mouth daily before breakfast. Alternates between 50 mcg and 75 mcg daily  2  . temazepam (RESTORIL) 30 MG capsule Take 30 mg by mouth at bedtime.     . Zinc 50 MG CAPS Take 550 mcg by mouth daily.     No current facility-administered medications for this visit.    Physical Exam: Vitals:   06/20/19 1115  BP: 132/70  Pulse: 63  SpO2: 96%   Weight: 126 lb (57.2 kg)  Height: 5' (1.524 m)    GEN- The patient is well appearing, alert and oriented x 3 today.   Head- normocephalic, atraumatic Eyes-  Sclera clear, conjunctiva pink Ears- hearing intact Oropharynx- clear Lungs- Clear to ausculation bilaterally, normal work of breathing Chest- pacemaker pocket is well healed Heart- Regular rate and rhythm, no murmurs, rubs or gallops, PMI not laterally displaced GI- soft, NT, ND, + BS Extremities- no clubbing, cyanosis, or edema  Pacemaker interrogation- reviewed in detail today,  See PACEART report  Assessment and Plan:  1. Symptomatic sinus bradycardia  Normal pacemaker function See Pace Art report No changes today  she is not device dependant today ekg today reveals atrial pacing at 60 bpm, PR 236 msec, otherwise normal ekg  Approaching ERI Risks, benefits, and alternatives to PPM pulse generator replacement were discussed in detail today.  The patient understands that risks include but are not limited to bleeding, infection, pneumothorax, perforation, tamponade, vascular damage, renal failure, MI, stroke, death, damage to his existing leads, and lead dislodgement and wishes to proceed.  We will therefore schedule the procedure at the next available time.  2. Paroxysmal afib afib burden is <0.1% % She is on xarelto for chads2vasc score of 4 She is no longer on amiodarone due to elevated LFTs previously.  These are recovering.  3. Hypertensive cardiovascular disease Stable No change required today   Risks, benefits and potential toxicities for medications prescribed and/or refilled reviewed with patient today.   Ok to schedule ppm generator change without further office visit once ERI.  Thompson Grayer MD, [facility] 06/20/2019 11:36 AM

## 2019-06-20 NOTE — Patient Instructions (Signed)
Medication Instructions:  Your physician recommends that you continue on your current medications as directed. Please refer to the Current Medication list given to you today.  *If you need a refill on your cardiac medications before your next appointment, please call your pharmacy*   Lab Work: None ordered If you have labs (blood work) drawn today and your tests are completely normal, you will receive your results only by: Marland Kitchen MyChart Message (if you have MyChart) OR . A paper copy in the mail If you have any lab test that is abnormal or we need to change your treatment, we will call you to review the results.   Testing/Procedures: None ordered   Follow-Up: Remote monitoring is used to monitor your Pacemaker of ICD from home. This monitoring reduces the number of office visits required to check your device to one time per year. It allows [place] to keep an eye on the functioning of your device to ensure it is working properly. You are scheduled for a device check from home on 07/16/2019. You may send your transmission at any time that day. If you have a wireless device, the transmission will be sent automatically. After your physician reviews your transmission, you will receive a postcard with your next transmission date.  At [facility], you and your health needs are our priority.  As part of our continuing mission to provide you with exceptional heart care, we have created designated Provider Care Teams.  These Care Teams include your primary Cardiologist (physician) and Advanced Practice Providers (APPs -  Physician Assistants and Nurse Practitioners) who all work together to provide you with the care you need, when you need it.  We recommend signing up for the patient portal called "MyChart".  Sign up information is provided on this After Visit Summary.  MyChart is used to connect with patients for Virtual Visits (Telemedicine).  Patients are able to view lab/test results, encounter notes,  upcoming appointments, etc.  Non-urgent messages can be sent to your provider as well.   To learn more about what you can do with MyChart, go to [email or web address].    Your next appointment:   1 year(s)  The format for your next appointment:   In Person  Provider:   Thompson Grayer, MD   Thank you for choosing [facility]!!    Other Instructions

## 2019-06-26 LAB — CUP PACEART INCLINIC DEVICE CHECK
Battery Voltage: 2.82 V
Brady Statistic AP VP Percent: 0.62 %
Brady Statistic AP VS Percent: 60.35 %
Brady Statistic AS VP Percent: 1.3 %
Brady Statistic AS VS Percent: 37.73 %
Brady Statistic RA Percent Paced: 60.95 %
Brady Statistic RV Percent Paced: 1.93 %
Date Time Interrogation Session: 20210512131423
Implantable Lead Implant Date: 20101109
Implantable Lead Implant Date: 20101109
Implantable Lead Location: 753859
Implantable Lead Location: 753860
Implantable Lead Model: 4469
Implantable Lead Model: 4470
Implantable Lead Serial Number: 535369
Implantable Lead Serial Number: 657967
Implantable Pulse Generator Implant Date: 20101109
Lead Channel Impedance Value: 496 Ohm
Lead Channel Impedance Value: 512 Ohm
Lead Channel Sensing Intrinsic Amplitude: 0.309 mV
Lead Channel Sensing Intrinsic Amplitude: 12.05 mV
Lead Channel Setting Pacing Amplitude: 2 V
Lead Channel Setting Pacing Amplitude: 2.5 V
Lead Channel Setting Pacing Pulse Width: 0.4 ms
Lead Channel Setting Sensing Sensitivity: 0.9 mV

## 2019-06-28 DIAGNOSIS — E038 Other specified hypothyroidism: Secondary | ICD-10-CM | POA: Diagnosis not present

## 2019-06-28 DIAGNOSIS — E7849 Other hyperlipidemia: Secondary | ICD-10-CM | POA: Diagnosis not present

## 2019-07-02 DIAGNOSIS — I10 Essential (primary) hypertension: Secondary | ICD-10-CM | POA: Diagnosis not present

## 2019-07-02 DIAGNOSIS — R944 Abnormal results of kidney function studies: Secondary | ICD-10-CM | POA: Diagnosis not present

## 2019-07-02 DIAGNOSIS — Z950 Presence of cardiac pacemaker: Secondary | ICD-10-CM | POA: Diagnosis not present

## 2019-07-02 DIAGNOSIS — I480 Paroxysmal atrial fibrillation: Secondary | ICD-10-CM | POA: Diagnosis not present

## 2019-07-04 ENCOUNTER — Other Ambulatory Visit: Payer: Self-pay

## 2019-07-04 MED ORDER — RIVAROXABAN 15 MG PO TABS: 15.0000 mg | ORAL_TABLET | Freq: Every day | ORAL | 1 refills | Status: DC

## 2019-07-16 ENCOUNTER — Ambulatory Visit (INDEPENDENT_AMBULATORY_CARE_PROVIDER_SITE_OTHER): Payer: Medicare Other | Admitting: *Deleted

## 2019-07-16 DIAGNOSIS — J029 Acute pharyngitis, unspecified: Secondary | ICD-10-CM | POA: Diagnosis not present

## 2019-07-16 DIAGNOSIS — R05 Cough: Secondary | ICD-10-CM | POA: Diagnosis not present

## 2019-07-16 DIAGNOSIS — J209 Acute bronchitis, unspecified: Secondary | ICD-10-CM | POA: Diagnosis not present

## 2019-07-16 DIAGNOSIS — J302 Other seasonal allergic rhinitis: Secondary | ICD-10-CM | POA: Diagnosis not present

## 2019-07-16 DIAGNOSIS — I495 Sick sinus syndrome: Secondary | ICD-10-CM

## 2019-07-17 ENCOUNTER — Telehealth: Payer: Self-pay

## 2019-07-17 LAB — CUP PACEART REMOTE DEVICE CHECK
Battery Voltage: 2.82 V
Brady Statistic AP VP Percent: 0.1 %
Brady Statistic AP VS Percent: 51.93 %
Brady Statistic AS VP Percent: 0.25 %
Brady Statistic AS VS Percent: 47.72 %
Brady Statistic RA Percent Paced: 51.96 %
Brady Statistic RV Percent Paced: 0.36 %
Date Time Interrogation Session: 20210608102417
Implantable Lead Implant Date: 20101109
Implantable Lead Implant Date: 20101109
Implantable Lead Location: 753859
Implantable Lead Location: 753860
Implantable Lead Model: 4469
Implantable Lead Model: 4470
Implantable Lead Serial Number: 535369
Implantable Lead Serial Number: 657967
Implantable Pulse Generator Implant Date: 20101109
Lead Channel Impedance Value: 456 Ohm
Lead Channel Impedance Value: 464 Ohm
Lead Channel Sensing Intrinsic Amplitude: 0.398 mV
Lead Channel Sensing Intrinsic Amplitude: 9.707 mV
Lead Channel Setting Pacing Amplitude: 2 V
Lead Channel Setting Pacing Amplitude: 2.5 V
Lead Channel Setting Pacing Pulse Width: 0.4 ms
Lead Channel Setting Sensing Sensitivity: 0.9 mV

## 2019-07-17 NOTE — Telephone Encounter (Signed)
Left message for patient to remind of missed remote transmission.  

## 2019-07-18 NOTE — Progress Notes (Signed)
Remote pacemaker transmission.   

## 2019-07-24 DIAGNOSIS — R05 Cough: Secondary | ICD-10-CM | POA: Diagnosis not present

## 2019-07-24 DIAGNOSIS — J209 Acute bronchitis, unspecified: Secondary | ICD-10-CM | POA: Diagnosis not present

## 2019-07-24 DIAGNOSIS — J302 Other seasonal allergic rhinitis: Secondary | ICD-10-CM | POA: Diagnosis not present

## 2019-07-31 ENCOUNTER — Telehealth (HOSPITAL_COMMUNITY): Payer: Self-pay | Admitting: *Deleted

## 2019-07-31 NOTE — Telephone Encounter (Signed)
Pt called in stating she accidentally took her morning medications twice yesterday - her BP ran low all day yesterday and continues to be low today 101/56 still feels weak in her legs. HR 63. She will hold her AM medications this morning and just resume her normal meds this evening. Pt in agreement. She is on prednisone for bronchitis and hasn't been sleeping well and is the cause of her mistake. She will call if further issues.

## 2019-08-02 ENCOUNTER — Telehealth: Payer: Self-pay | Admitting: Cardiovascular Disease

## 2019-08-02 NOTE — Telephone Encounter (Signed)
Pt called to speak with [facility]. Would like for her to give a phone call when she returns to work

## 2019-08-03 ENCOUNTER — Telehealth: Payer: Self-pay | Admitting: Cardiovascular Disease

## 2019-08-03 NOTE — Telephone Encounter (Signed)
Pt c/o BP issue: STAT if pt c/o blurred vision, one-sided weakness or slurred speech  1. What are your last 5 BP readings? Patient does not have a recording of her BP's, but states Monday and Wednesday BP was 86/53. This am BP was 91/53 w/ 63 HR.   2. Are you having any other symptoms (ex. Dizziness, headache, blurred vision, passed out)? Having trouble lifting arms and legs. Tried exercising but didn't raise BP. Has had dizziness and a headache, but not at the moment. Feeling extremely tried.   3. What is your BP issue? Patient states her BP has continued to stay down.

## 2019-08-03 NOTE — Telephone Encounter (Signed)
See phone note 6/25

## 2019-08-03 NOTE — Telephone Encounter (Signed)
Spoke with pt, the low bp started on Monday after she doubled her medications by mistake but it continues to be low. She is currently taking diltiazem 360 mg and metoprolol 50 in the am, 25 mg at noon and then 50 mg in the pm by her report. She would like to stop the 12 noon dose to see if that will help and okay given for her to do that. She will let us know if the bp does not come up and she will monitor for any increased heart rates or atrial fib. Will make dr Oval Linsey aware.

## 2019-08-09 NOTE — Telephone Encounter (Signed)
Per Dr Jackalyn Lombard RN, pt will not need to be seen before change out can be scheduled. She will contact pt on 7/2 to schedule.  Pt is aware of plan and will contact the office if her symptoms worsen. She had no additional needs at this time.

## 2019-08-09 NOTE — Telephone Encounter (Signed)
Called pt regarding her MyChart message. She states she has been feeling weak and is having "heavy breathing" since Monday. She is not sure if it is her pacemaker since she is close to ERI.  She is sending in a remote check for evaluation. She understands we will contact her back.

## 2019-08-09 NOTE — Telephone Encounter (Signed)
Remote transmission received.    Device is at [facility] as of 07/25/19, programming changed to VVI 65, AT/ AF therapies off.  Previously she was AAIR -> DDDR lower rate of 60  AP about 50% and barely VP, she is now only AP about 15% and VP 40% of the time.    LOV with Dr. Rayann Heman in May, he indicated she can be scheduled for battery changeout.

## 2019-08-10 ENCOUNTER — Encounter: Payer: Self-pay | Admitting: *Deleted

## 2019-08-10 ENCOUNTER — Telehealth: Payer: Self-pay | Admitting: *Deleted

## 2019-08-10 NOTE — Telephone Encounter (Signed)
Late entry-- Spoke with patient yesterday aroung 7:30 pm Patient has not been feeling welling for about a week  She had been having issues with low blood pressure but yesterday blood pressure better Feeling washed out, no energy, and weight gain Did schedule follow up visit next week however patient received call yesterday that she is at [facility] with pacemaker. She is expecting a call from Dr Kendell Bane nurse today for scheduling generator change out. She will decide on keeping visit after speaking with her and getting scheduled

## 2019-08-10 NOTE — Telephone Encounter (Signed)
Mychart message sent to patient to keep visit, unable to reach via phone

## 2019-08-10 NOTE — Telephone Encounter (Signed)
Spoke with patient. Advised that since she feels poorly in VVI mode at 65bpm, we can reprogram her PPM (EnRhythm) to original settings while she awaits gen change scheduling. Pt unable to come in today, but accepted DC appointment on 08/14/19 at 2:00pm. Explained that pt's BLE edema and leg weakness with activity can likely be attributed to VVI pacing. Already scheduled for APP f/u on 08/16/19 to address swelling. Pt denies cardiac symptoms at rest. ED precautions reviewed for any worsening cardiac symptoms over the weekend. Pt verbalizes understanding and appreciation of call.

## 2019-08-10 NOTE — Telephone Encounter (Signed)
ERI is not emergent.  Agree with following up with Dr. Jackalyn Lombard team as planned.  She should come next week as planned, as Dr. Rayann Heman isn't going to address the issues of fatigue and weight gain.

## 2019-08-14 ENCOUNTER — Other Ambulatory Visit: Payer: Self-pay

## 2019-08-14 ENCOUNTER — Ambulatory Visit (INDEPENDENT_AMBULATORY_CARE_PROVIDER_SITE_OTHER): Payer: Medicare Other | Admitting: Emergency Medicine

## 2019-08-14 DIAGNOSIS — I495 Sick sinus syndrome: Secondary | ICD-10-CM | POA: Diagnosis not present

## 2019-08-14 LAB — CBC WITH DIFFERENTIAL/PLATELET
Basophils Absolute: 0 10*3/uL (ref 0.0–0.2)
Basos: 0 %
EOS (ABSOLUTE): 0.3 10*3/uL (ref 0.0–0.4)
Eos: 4 %
Hematocrit: 35.5 % (ref 34.0–46.6)
Hemoglobin: 11.9 g/dL (ref 11.1–15.9)
Lymphocytes Absolute: 1.7 10*3/uL (ref 0.7–3.1)
Lymphs: 24 %
MCH: 29.8 pg (ref 26.6–33.0)
MCHC: 33.5 g/dL (ref 31.5–35.7)
MCV: 89 fL (ref 79–97)
Monocytes Absolute: 1.1 10*3/uL — ABNORMAL HIGH (ref 0.1–0.9)
Monocytes: 15 %
Neutrophils Absolute: 4.2 10*3/uL (ref 1.4–7.0)
Neutrophils: 57 %
Platelets: 213 10*3/uL (ref 150–450)
RBC: 4 x10E6/uL (ref 3.77–5.28)
RDW: 14.6 % (ref 11.7–15.4)
WBC: 7.2 10*3/uL (ref 3.4–10.8)

## 2019-08-14 LAB — CUP PACEART INCLINIC DEVICE CHECK
Battery Voltage: 2.81 V
Brady Statistic AP VP Percent: 0.05 %
Brady Statistic AP VS Percent: 32.4 %
Brady Statistic AS VP Percent: 22.55 %
Brady Statistic AS VS Percent: 45 %
Brady Statistic RA Percent Paced: 32.3 %
Brady Statistic RV Percent Paced: 27.66 %
Date Time Interrogation Session: 20210706143503
Implantable Lead Implant Date: 20101109
Implantable Lead Implant Date: 20101109
Implantable Lead Location: 753859
Implantable Lead Location: 753860
Implantable Lead Model: 4469
Implantable Lead Model: 4470
Implantable Lead Serial Number: 535369
Implantable Lead Serial Number: 657967
Implantable Pulse Generator Implant Date: 20101109
Lead Channel Impedance Value: 440 Ohm
Lead Channel Impedance Value: 504 Ohm
Lead Channel Pacing Threshold Amplitude: 1 V
Lead Channel Pacing Threshold Amplitude: 1 V
Lead Channel Pacing Threshold Pulse Width: 0.4 ms
Lead Channel Pacing Threshold Pulse Width: 0.6 ms
Lead Channel Sensing Intrinsic Amplitude: 0.486 mV
Lead Channel Sensing Intrinsic Amplitude: 11.715 mV
Lead Channel Setting Pacing Amplitude: 2 V
Lead Channel Setting Pacing Amplitude: 2.5 V
Lead Channel Setting Pacing Pulse Width: 0.4 ms
Lead Channel Setting Sensing Sensitivity: 0.9 mV

## 2019-08-14 LAB — BASIC METABOLIC PANEL
BUN/Creatinine Ratio: 21 (ref 12–28)
BUN: 18 mg/dL (ref 8–27)
CO2: 27 mmol/L (ref 20–29)
Calcium: 9.1 mg/dL (ref 8.7–10.3)
Chloride: 100 mmol/L (ref 96–106)
Creatinine, Ser: 0.84 mg/dL (ref 0.57–1.00)
GFR calc Af Amer: 74 mL/min/{1.73_m2} (ref >59)
GFR calc non Af Amer: 64 mL/min/{1.73_m2} (ref >59)
Glucose: 98 mg/dL (ref 65–99)
Potassium: 4 mmol/L (ref 3.5–5.2)
Sodium: 134 mmol/L (ref 134–144)

## 2019-08-14 NOTE — Patient Instructions (Signed)
Medication Instructions:  Your physician recommends that you continue on your current medications as directed. Please refer to the Current Medication list given to you today.  Labwork: You will get lab work today:  BMP and CBC  Testing/Procedures: None ordered.  Follow-Up:  SEE INSTRUCTION LETTER  Any Other Special Instructions Will Be Listed Below (If Applicable).  If you need a refill on your cardiac medications before your next appointment, please call your pharmacy.    Pacemaker Battery Change  A pacemaker battery usually lasts 5-15 years (6-7 years on average). A few times a year, you will be asked to visit your health care provider to have a full evaluation of your pacemaker. When the battery is low, your pacemaker battery and generator will be completely replaced. Most often, this procedure is simpler than the first surgery because the wires (leads) that connect the generator to the heart are already in place. There are many things that affect how long a pacemaker battery will last, including:  The age of the pacemaker.  The number of leads you have(1, 2, or 3).  The pacemaker workload. If the pacemaker is helping the heart more often, the battery will not last as long.  Power (voltage) settings. Tell a health care provider about:  Any allergies you have.  All medicines you are taking, including vitamins, herbs, eye drops, creams, and over-the-counter medicines.  Any problems you or family members have had with anesthetic medicines.  Any blood disorders you have.  Any surgeries you have had, especially the surgeries you have had since your last pacemaker was placed.  Any medical conditions you have.  Whether you are pregnant or may be pregnant.  Any symptoms of heart problems, such as chest pain, trouble breathing, palpitations, light-headedness, or feelings of an abnormal or irregular heartbeat.  Smoking habits. This can affect your reaction to  anesthesia. What are the risks? Generally, this is a safe procedure. However, problems may occur, including:  Bleeding.  Bruising of the skin around where the surgical cut (incision) was made.  Pulling apart of the skin at the incision site.  Infection.  Nerve damage.  Injury to other organs, such as the lungs.  Allergic reaction to anesthetics or other medicines used during the procedure.  People with diabetes may have a temporary increase in blood sugar (glucose) after any surgical procedure. What happens before the procedure? Staying hydrated Follow instructions from your health care provider about hydration, which may include:  Up to 2 hours before the procedure - you may continue to drink clear liquids, such as water, clear fruit juice, black coffee, and plain tea. Eating and drinking restrictions Follow instructions from your health care provider about eating and drinking restrictions, which may include:  8 hours before the procedure - stop eating heavy meals or foods such as meat, fried foods, or fatty foods.  6 hours before the procedure - stop eating light meals or foods, such as toast or cereal.  6 hours before the procedure - stop drinking milk or drinks that contain milk.  2 hours before the procedure - stop drinking clear liquids. General instructions  Ask your health care provider about: ? Changing or stopping your regular medicines. This is especially important if you are taking diabetes medicines or blood thinners. ? Taking medicines such as aspirin and ibuprofen. These medicines can thin your blood. Do not take these medicines before your procedure if your health care provider instructs you not to. ? Taking a sip of water   with any approved medicines on the morning of the procedure.  Plan to have someone take you home after the procedure. What happens during the procedure?  To reduce your risk of infection: ? Your health care team will wash or sanitize  their hands. ? The skin around the area of the chest will be washed with soap. ? Hair may be removed from the surgical area.  An IV tube will be inserted into one your veins to give you medicine and fluids.  You will be given one or more of the following: ? A medicine to help you relax (sedative). ? A medicine to numb the area where the pacemaker is located (local anesthetic).  You may be given antibiotic medicine to prevent infection.  Your health care provider will make an incision to reopen the pocket holding the pacemaker.  The old pacemaker will be disconnected from the leads.  The leads will be tested.  If needed, the leads will be replaced. If the leads are functioning properly, the new pacemaker will be connected to the existing leads.  A heart monitor and the pacemaker programmer will be used to make sure that the newly implanted pacemaker is working properly.  The incision site will be closed. A bandage (dressing) will be placed over the pacemaker site. The procedure may vary among health care providers and hospitals. What happens after the procedure?  Your blood pressure, heart rate, breathing rate, and blood oxygen level will be monitored until your health care team is satisfied that your pacemaker is working properly.  Your health care provider will tell you when your pacemaker will need to be tested again, or when to return to the office for removal of dressing and stitches.  Do not drive for 24 hours if you were given a sedative.  The dressing will be removed 24-48 hours after the procedure, or as told by your health care provider. Summary  A pacemaker battery usually lasts 5-15 years (6-7 years on average).  When the battery is low, your pacemaker battery and generator will be completely replaced.  Risks of this procedure include bleeding, bruising, infection, damage to other structures, pulling apart of the skin at the incision site, and allergic reactions to  medicines or anesthetics.  Most often, this procedure is simpler than the first surgery because the wires (leads) that connect the generator to the heart are already in place. This information is not intended to replace advice given to you by your health care provider. Make sure you discuss any questions you have with your health care provider. Document Revised: 01/07/2017 Document Reviewed: 12/30/2015 Elsevier Patient Education  2020 [employer]  

## 2019-08-14 NOTE — Progress Notes (Signed)
Pacemaker check in clinic, added-on for mode reprogramming as ERI was triggered on 07/25/19. Patient symptomatic with reversion to VVI 65. Normal device function. Thresholds, sensing, impedances consistent with previous measurements. 1 mode switch (<0.1%), EGM appears 2 min of AT. No high ventricular rates noted. Device programmed at appropriate safety margins. Device reprogrammed to previous settings (MVP, rate response on, base rate 60bpm). Patient education completed. Generator replacement scheduled for 08/16/19.  Sonia Baller, RN, provided patient with generator replacement instructions and scrub. Patient to have labs drawn today.

## 2019-08-16 ENCOUNTER — Encounter (HOSPITAL_COMMUNITY): Payer: Self-pay | Admitting: Internal Medicine

## 2019-08-16 ENCOUNTER — Ambulatory Visit: Payer: Medicare Other | Admitting: General Practice

## 2019-08-16 ENCOUNTER — Encounter (HOSPITAL_COMMUNITY)
Admission: RE | Disposition: A | Discharge status: Home / Self Care | Payer: Self-pay | Source: Home / Self Care | Attending: Internal Medicine

## 2019-08-16 ENCOUNTER — Other Ambulatory Visit: Payer: Self-pay

## 2019-08-16 ENCOUNTER — Ambulatory Visit (HOSPITAL_COMMUNITY)
Admission: RE | Admit: 2019-08-16 | Discharge: 2019-08-16 | Disposition: A | Discharge status: Home / Self Care | Payer: Medicare Other | Attending: Internal Medicine | Admitting: Internal Medicine

## 2019-08-16 DIAGNOSIS — E039 Hypothyroidism, unspecified: Secondary | ICD-10-CM | POA: Insufficient documentation

## 2019-08-16 DIAGNOSIS — Z45010 Encounter for checking and testing of cardiac pacemaker pulse generator [battery]: Secondary | ICD-10-CM | POA: Diagnosis not present

## 2019-08-16 DIAGNOSIS — I10 Essential (primary) hypertension: Secondary | ICD-10-CM | POA: Insufficient documentation

## 2019-08-16 DIAGNOSIS — I4891 Unspecified atrial fibrillation: Secondary | ICD-10-CM | POA: Insufficient documentation

## 2019-08-16 DIAGNOSIS — Z79890 Hormone replacement therapy (postmenopausal): Secondary | ICD-10-CM | POA: Insufficient documentation

## 2019-08-16 DIAGNOSIS — Z79899 Other long term (current) drug therapy: Secondary | ICD-10-CM | POA: Insufficient documentation

## 2019-08-16 DIAGNOSIS — Z7901 Long term (current) use of anticoagulants: Secondary | ICD-10-CM | POA: Diagnosis not present

## 2019-08-16 DIAGNOSIS — I495 Sick sinus syndrome: Secondary | ICD-10-CM | POA: Diagnosis not present

## 2019-08-16 DIAGNOSIS — K589 Irritable bowel syndrome without diarrhea: Secondary | ICD-10-CM | POA: Insufficient documentation

## 2019-08-16 HISTORY — PX: PPM GENERATOR CHANGEOUT: EP1233

## 2019-08-16 SURGERY — PPM GENERATOR CHANGEOUT

## 2019-08-16 MED ORDER — CEFAZOLIN SODIUM-DEXTROSE 2-4 GM/100ML-% IV SOLN
2.0000 g | INTRAVENOUS | Status: AC
  Administered 2019-08-16: 2 g via INTRAVENOUS

## 2019-08-16 MED ORDER — LIDOCAINE HCL (PF) 1 % IJ SOLN
INTRAMUSCULAR | Status: DC | PRN
  Administered 2019-08-16: 60 mL

## 2019-08-16 MED ORDER — MIDAZOLAM HCL 5 MG/5ML IJ SOLN
INTRAMUSCULAR | Status: AC
  Filled 2019-08-16: qty 5

## 2019-08-16 MED ORDER — CEFAZOLIN SODIUM-DEXTROSE 2-4 GM/100ML-% IV SOLN
INTRAVENOUS | Status: AC
  Filled 2019-08-16: qty 100

## 2019-08-16 MED ORDER — SODIUM CHLORIDE 0.9% FLUSH: 3.0000 mL | Freq: Two times a day (BID) | INTRAVENOUS | Status: DC

## 2019-08-16 MED ORDER — ONDANSETRON HCL 4 MG/2ML IJ SOLN: 4.0000 mg | Freq: Four times a day (QID) | INTRAMUSCULAR | Status: DC | PRN

## 2019-08-16 MED ORDER — SODIUM CHLORIDE 0.9 % IV SOLN: INTRAVENOUS | Status: DC

## 2019-08-16 MED ORDER — SODIUM CHLORIDE 0.9 % IV SOLN
INTRAVENOUS | Status: AC
  Filled 2019-08-16: qty 2

## 2019-08-16 MED ORDER — SODIUM CHLORIDE 0.9 % IV SOLN
80.0000 mg | INTRAVENOUS | Status: AC
  Administered 2019-08-16: 80 mg

## 2019-08-16 MED ORDER — SODIUM CHLORIDE 0.9% FLUSH: 3.0000 mL | INTRAVENOUS | Status: DC | PRN

## 2019-08-16 MED ORDER — CHLORHEXIDINE GLUCONATE 4 % EX LIQD
4.0000 "application " | Freq: Once | CUTANEOUS | Status: DC
  Filled 2019-08-16: qty 60

## 2019-08-16 MED ORDER — LIDOCAINE HCL (PF) 1 % IJ SOLN
INTRAMUSCULAR | Status: AC
  Filled 2019-08-16: qty 60

## 2019-08-16 MED ORDER — SODIUM CHLORIDE 0.9 % IV SOLN: 250.0000 mL | INTRAVENOUS | Status: DC | PRN

## 2019-08-16 MED ORDER — ACETAMINOPHEN 325 MG PO TABS
325.0000 mg | ORAL_TABLET | ORAL | Status: DC | PRN
  Filled 2019-08-16: qty 2

## 2019-08-16 MED ORDER — MIDAZOLAM HCL 5 MG/5ML IJ SOLN
INTRAMUSCULAR | Status: DC | PRN
  Administered 2019-08-16: 1 mg via INTRAVENOUS

## 2019-08-16 SURGICAL SUPPLY — 5 items
CABLE SURGICAL S-101-97-12 (CABLE) ×2 IMPLANT
IPG PACE AZUR XT DR MRI W1DR01 (Pacemaker) ×1 IMPLANT
PACE AZURE XT DR MRI W1DR01 (Pacemaker) ×2 IMPLANT
PAD PRO RADIOLUCENT 2001M-C (PAD) ×2 IMPLANT
TRAY PACEMAKER INSERTION (PACKS) ×2 IMPLANT

## 2019-08-16 NOTE — Discharge Instructions (Signed)
Implantable Cardiac Device Battery Change, Care After  This sheet gives you information about how to care for yourself after your procedure. Your health care provider may also give you more specific instructions. If you have problems or questions, contact your health care provider. What can I expect after the procedure? After your procedure, it is common to have:  Pain or soreness at the site where the cardiac device was inserted.  Swelling at the site where the cardiac device was inserted.  You should received an information card for your new device in 4-8 weeks. Follow these instructions at home: Incision care   Keep the incision clean and dry. ? Do not take baths, swim, or use a hot tub until after your wound check.  ? Do not shower for at least 7 days, or as directed by your health care provider. ? Pat the area dry with a clean towel. Do not rub the area. This may cause bleeding.  Follow instructions from your health care provider about how to take care of your incision. Make sure you: ? Leave stitches (sutures), skin glue, or adhesive strips in place. These skin closures may need to stay in place for 2 weeks or longer. If adhesive strip edges start to loosen and curl up, you may trim the loose edges. Do not remove adhesive strips completely unless your health care provider tells you to do that.  Check your incision area every day for signs of infection. Check for: ? More redness, swelling, or pain. ? More fluid or blood. ? Warmth. ? Pus or a bad smell. Activity  Do not lift anything that is heavier than 10 lb (4.5 kg) until your health care provider says it is okay to do so.  For the first week, or as long as told by your health care provider: ? Avoid lifting your affected arm higher than your shoulder. ? After 1 week, Be gentle when you move your arms over your head. It is okay to raise your arm to comb your hair. ? Avoid strenuous exercise.  Ask your health care provider  when it is okay to: ? Resume your normal activities. ? Return to work or school. ? Resume sexual activity. Eating and drinking  Eat a heart-healthy diet. This should include plenty of fresh fruits and vegetables, whole grains, low-fat dairy products, and lean protein like chicken and fish.  Limit alcohol intake to no more than 1 drink a day for non-pregnant women and 2 drinks a day for men. One drink equals 12 oz of beer, 5 oz of wine, or 1 oz of hard liquor.  Check ingredients and nutrition facts on packaged foods and beverages. Avoid the following types of food: ? Food that is high in salt (sodium). ? Food that is high in saturated fat, like full-fat dairy or red meat. ? Food that is high in trans fat, like fried food. ? Food and drinks that are high in sugar. Lifestyle  Do not use any products that contain nicotine or tobacco, such as cigarettes and e-cigarettes. If you need help quitting, ask your health care provider.  Take steps to manage and control your weight.  Once cleared, get regular exercise. Aim for 150 minutes of moderate-intensity exercise (such as walking or yoga) or 75 minutes of vigorous exercise (such as running or swimming) each week.  Manage other health problems, such as diabetes or high blood pressure. Ask your health care provider how you can manage these conditions. General instructions  Do   not drive for 24 hours after your procedure if you were given a medicine to help you relax (sedative).  Take over-the-counter and prescription medicines only as told by your health care provider.  Avoid putting pressure on the area where the cardiac device was placed.  If you need an MRI after your cardiac device has been placed, be sure to tell the health care provider who orders the MRI that you have a cardiac device.  Avoid close and prolonged exposure to electrical devices that have strong magnetic fields. These include: ? Cell phones. Avoid keeping them in a  pocket near the cardiac device, and try using the ear opposite the cardiac device. ? MP3 players. ? Household appliances, like microwaves. ? Metal detectors. ? Electric generators. ? High-tension wires.  Keep all follow-up visits as directed by your health care provider. This is important. Contact a health care provider if:  You have pain at the incision site that is not relieved by over-the-counter or prescription medicines.  You have any of these around your incision site or coming from it: ? More redness, swelling, or pain. ? Fluid or blood. ? Warmth to the touch. ? Pus or a bad smell.  You have a fever.  You feel brief, occasional palpitations, light-headedness, or any symptoms that you think might be related to your heart. Get help right away if:  You experience chest pain that is different from the pain at the cardiac device site.  You develop a red streak that extends above or below the incision site.  You experience shortness of breath.  You have palpitations or an irregular heartbeat.  You have light-headedness that does not go away quickly.  You faint or have dizzy spells.  Your pulse suddenly drops or increases rapidly and does not return to normal.  You begin to gain weight and your legs and ankles swell. Summary  After your procedure, it is common to have pain, soreness, and some swelling where the cardiac device was inserted.  Make sure to keep your incision clean and dry. Follow instructions from your health care provider about how to take care of your incision.  Check your incision every day for signs of infection, such as more pain or swelling, pus or a bad smell, warmth, or leaking fluid and blood.  Avoid strenuous exercise and lifting your left arm higher than your shoulder for 2 weeks, or as long as told by your health care provider. This information is not intended to replace advice given to you by your health care provider. Make sure you discuss  any questions you have with your health care provider.    Resume xarelto on Sunday 08/19/19

## 2019-08-16 NOTE — H&P (Signed)
Primary EP:  Dr Rayann Heman  Kathryn Gibson is a 86 y.o. female who presents today for pacemaker generator change.  Since last being seen in our clinic, the patient reports doing very well.  Her pacemaker has reached ERI and she reports increased palpitations and fatigue.  Today, she denies symptoms of palpitations, chest pain, shortness of breath,  lower extremity edema, dizziness, presyncope, or syncope.  The patient is otherwise without complaint today.       Past Medical History:  Diagnosis Date  . Arthritis   . Atrial fibrillation ([place])   . Bradycardia    s/p PPM  . Cataract   . Colonic polyp    adenomatous  . Constipation   . Diverticulosis   . Hemorrhoids   . Hypertension   . Hypothyroidism   . Irritable bowel syndrome (IBS)   . Pacemaker 2010        Past Surgical History:  Procedure Laterality Date  . BREAST CYST ASPIRATION Left 1980s  . COLONOSCOPY    . ELECTROPHYSIOLOGIC STUDY N/A 07/01/2015   Procedure: Atrial Fibrillation Ablation;  Surgeon: Thompson Grayer, MD;  Location: [place] [facility];  Service: Cardiovascular;  Laterality: N/A;  . EYE SURGERY    . HEMATOMA EVACUATION Right 07/15/2014   Procedure: EVACUATION Right Groin Hematoma;  Surgeon: Rosetta Posner, MD;  Location: [place];  Service: Vascular;  Laterality: Right;  . NASAL ENDOSCOPY WITH EPISTAXIS CONTROL N/A 07/14/2014   Procedure: NASAL ENDOSCOPY WITH EPISTAXIS CONTROL;  Surgeon: Ruby Cola, MD;  Location: [place];  Service: ENT;  Laterality: N/A;  . NASAL ENDOSCOPY WITH EPISTAXIS CONTROL Bilateral 07/15/2014   Procedure: NASAL ENDOSCOPY WITH EPISTAXIS CONTROL;  Surgeon: Ruby Cola, MD;  Location: [place];  Service: ENT;  Laterality: Bilateral;  . PACEMAKER INSERTION  2010   implanted by Dr Doreatha Lew ([facility])  . RADIOLOGY WITH ANESTHESIA N/A 07/14/2014   Procedure: RADIOLOGY WITH ANESTHESIA;  Surgeon: Medication Radiologist, MD;  Location: [place];  Service: Radiology;  Laterality: N/A;  . TEE  WITHOUT CARDIOVERSION N/A 07/01/2015   Procedure: TRANSESOPHAGEAL ECHOCARDIOGRAM (TEE);  Surgeon: Sueanne Margarita, MD;  Location[facility];  Service: Cardiovascular;  Laterality: N/A;    ROS- all systems are reviewed and negative except as per HPI above        Current Outpatient Medications  Medication Sig Dispense Refill  . acetaminophen (TYLENOL) 500 MG tablet Take 500-1,000 mg by mouth every 6 (six) hours as needed for moderate pain or headache.    . Ascorbic Acid (VITAMIN C) 1000 MG tablet Take 1,000 mg by mouth daily.      . Calcium-Vitamin D (CALTRATE 600 PLUS-VIT D PO) Take 1 tablet by mouth daily.    . cholecalciferol (VITAMIN D) 1000 UNITS tablet Take 1,000 Units by mouth daily.      . Coenzyme Q10 (CO Q-10 PO) Take 1 tablet by mouth daily.    Marland Kitchen diltiazem (CARDIZEM CD) 360 MG 24 hr capsule Take 1 capsule (360 mg total) by mouth daily. 90 capsule 3  . LYSINE PO Take 1-2 tablets by mouth daily as needed (fever blister).    . metoprolol succinate (TOPROL-XL) 50 MG 24 hr tablet Take 75mg  in the AM and 50mg  in the PM 225 tablet 3  . Multiple Vitamins-Minerals (ICAPS AREDS 2 PO) Take by mouth daily.    . Omega-3 Fatty Acids (FISH OIL) 1200 MG CAPS Take 1 capsule by mouth daily. Mega Red.    . polyethylene glycol powder (GLYCOLAX/MIRALAX) powder  Take 2 capfuls by mouth daily  0  . pravastatin (PRAVACHOL) 10 MG tablet Take 1 tablet (10 mg total) by mouth daily. 90 tablet 3  . Probiotic Product (ALIGN PO) Take 1 capsule by mouth daily.     . Rivaroxaban (XARELTO) 15 MG TABS tablet Take 1 tablet (15 mg total) by mouth daily with supper. 90 tablet 1  . SYNTHROID 50 MCG tablet as directed.  7  . SYNTHROID 75 MCG tablet Take 50-75 mcg by mouth daily before breakfast. Alternates between 50 mcg and 75 mcg daily  2  . temazepam (RESTORIL) 30 MG capsule Take 30 mg by mouth at bedtime.     . Zinc 50 MG CAPS Take 550 mcg by mouth daily.     No current  facility-administered medications for this visit.   Physical Exam: Vitals:   08/16/19 1005  BP: 135/71  Pulse: 64  Resp: 14  Temp: (!) 97.5 F (36.4 C)  TempSrc: Oral  SpO2: 98%  Weight: 58.1 kg  Height: 5' 0.25" (1.53 m)    GEN- The patient is well appearing, alert and oriented x 3 today.   Head- normocephalic, atraumatic Eyes-  Sclera clear, conjunctiva pink Ears- hearing intact Oropharynx- clear Neck- supple,  Lungs-  normal work of breathing Chest- pacemaker pocket is without hematoma/ bruit Heart- Regular rate and rhythm  GI- soft Extremities- no clubbing, cyanosis, or edema Neuro- strength and sensation are intact     Assessment and Plan:  1. Symptomatic sinus bradycardia  Normal pacemaker function See Pace Art report No changes today  she is not device dependant today  Risks, benefits, and alternatives to PPM pulse generator replacement were discussed in detail today.  The patient understands that risks include but are not limited to bleeding, infection, pneumothorax, perforation, tamponade, vascular damage, renal failure, MI, stroke, death, damage to his existing leads, and lead dislodgement and wishes to proceed.     Thompson Grayer MD, [place] 08/16/2019 10:37 AM

## 2019-08-19 ENCOUNTER — Telehealth: Payer: Self-pay | Admitting: Internal Medicine

## 2019-08-20 NOTE — Telephone Encounter (Signed)
Damian Leavell, RN  Cv Div Nl Triage 19 minutes ago (9:06 AM)   Pt has had some breakthrough afib off her metoprolol.  She was advised by on call to restart metoprolol at 50 mg bid.   Dr. Oval Linsey is general cardiologist-fyi

## 2019-08-20 NOTE — Telephone Encounter (Signed)
Spoke with patient and assisted with sending manual transmission.  Pt reports she had a few brief episodes of fast/ racing heart rate up to 110 in the evening of 7/9, felt good all day 7/10, but then around 5pm last night she started having symptoms of fast racing heart rate.  Pt indicates she stopped taking Metoprolol approximately 2 weeks ago at instruction of Dr. Oval Linsey due to low BP.  At that time she was feeling "lousy" with SBP <100, per her report.  She spoke with Dr. Juliane Lack, on-call last night, he instructed her to resume taking Metoprolol 50mg  BID (previous dose was 75mg  in AM and 50mg  in PM).  She felt better after taking 50mg  last night and plans to take 50mg  this AM.     Presenting rhythm- asymptomatic   First episode with available EGM  based on the list it appears this episode was part of back to back episodes #4-7 in total lasting for about 15 minutes making it the longest

## 2019-08-23 ENCOUNTER — Telehealth (HOSPITAL_COMMUNITY): Payer: Self-pay | Admitting: *Deleted

## 2019-08-23 MED ORDER — METOPROLOL SUCCINATE ER 50 MG PO TB24: ORAL_TABLET | ORAL | Status: DC

## 2019-08-23 NOTE — Telephone Encounter (Signed)
Pt called in with elevated HRs in the daytime "its just racing away in the daytime until about 6pm" BP 135/83 HR 95 currently. Pt will increase metoprolol back to 75mg  in the AM and 50mg  in the PM and has follow up next week. PT will call if issues arise before follow up.

## 2019-08-24 ENCOUNTER — Telehealth (HOSPITAL_COMMUNITY): Payer: Self-pay | Admitting: *Deleted

## 2019-08-24 NOTE — Telephone Encounter (Signed)
Pt increased her BB yetserday - today her BP dropped to 86/40 after drinking good amount of water her BP increased to 105/60 feeling much improved. She will switch her cardizem 360 to lunch time to hopefully spread out her BP meds and not have the drops in the AM. Pt will call if issues continue.

## 2019-08-27 ENCOUNTER — Encounter (HOSPITAL_COMMUNITY): Payer: Self-pay | Admitting: Nurse Practitioner

## 2019-08-27 ENCOUNTER — Ambulatory Visit (HOSPITAL_COMMUNITY)
Admission: RE | Admit: 2019-08-27 | Discharge: 2019-08-27 | Disposition: A | Discharge status: Home / Self Care | Payer: Medicare Other | Source: Ambulatory Visit | Attending: Nurse Practitioner | Admitting: Nurse Practitioner

## 2019-08-27 ENCOUNTER — Other Ambulatory Visit: Payer: Self-pay

## 2019-08-27 VITALS — BP 130/74 | HR 66 | Ht 61.0 in | Wt 130.0 lb

## 2019-08-27 DIAGNOSIS — Z7901 Long term (current) use of anticoagulants: Secondary | ICD-10-CM | POA: Insufficient documentation

## 2019-08-27 DIAGNOSIS — Z79890 Hormone replacement therapy (postmenopausal): Secondary | ICD-10-CM | POA: Insufficient documentation

## 2019-08-27 DIAGNOSIS — K589 Irritable bowel syndrome without diarrhea: Secondary | ICD-10-CM | POA: Diagnosis not present

## 2019-08-27 DIAGNOSIS — I4891 Unspecified atrial fibrillation: Secondary | ICD-10-CM

## 2019-08-27 DIAGNOSIS — D6869 Other thrombophilia: Secondary | ICD-10-CM

## 2019-08-27 DIAGNOSIS — Z950 Presence of cardiac pacemaker: Secondary | ICD-10-CM | POA: Insufficient documentation

## 2019-08-27 DIAGNOSIS — Z888 Allergy status to other drugs, medicaments and biological substances status: Secondary | ICD-10-CM | POA: Diagnosis not present

## 2019-08-27 DIAGNOSIS — K5790 Diverticulosis of intestine, part unspecified, without perforation or abscess without bleeding: Secondary | ICD-10-CM | POA: Insufficient documentation

## 2019-08-27 DIAGNOSIS — E039 Hypothyroidism, unspecified: Secondary | ICD-10-CM | POA: Diagnosis not present

## 2019-08-27 DIAGNOSIS — Z87891 Personal history of nicotine dependence: Secondary | ICD-10-CM | POA: Diagnosis not present

## 2019-08-27 DIAGNOSIS — Z79899 Other long term (current) drug therapy: Secondary | ICD-10-CM | POA: Insufficient documentation

## 2019-08-27 DIAGNOSIS — I959 Hypotension, unspecified: Secondary | ICD-10-CM | POA: Insufficient documentation

## 2019-08-27 DIAGNOSIS — Z8249 Family history of ischemic heart disease and other diseases of the circulatory system: Secondary | ICD-10-CM | POA: Insufficient documentation

## 2019-08-27 DIAGNOSIS — I10 Essential (primary) hypertension: Secondary | ICD-10-CM | POA: Insufficient documentation

## 2019-08-27 NOTE — Progress Notes (Signed)
Primary Care Physician: Prince Solian, MD Referring Physician: self referred EP: Dr. Ezzard Flax is a 86 y.o. female with a h/o afib previously on amiodarone, stopped previously for elevated liver enzymes. She had a recent gen change out. At that time she was having some soft BP's and BB was stopped. After a few days she was calling the afib office with c/o that she felt her HR was running too fast. He was started back on low dose BB and called the office again on Friday, again with HR's in the 90's, 'heart beating too fast."  We resumed her piror BB dose. Hr had one dip in her BP, drank some water and BP improved. Asked to be seen this am as she felt her heart rate was irregular. Now in the office, she is a paced. She does not  feel the irregular HB now. She is due Thursday for wound check in the device clinic and can ask for her device to be interrogated to further understand heart irregularities.   Today, she denies symptoms of palpitations, chest pain, shortness of breath, orthopnea, PND, lower extremity edema, dizziness, presyncope, syncope, or neurologic sequela. The patient is tolerating medications without difficulties and is otherwise without complaint today.   Past Medical History:  Diagnosis Date  . Arthritis   . Atrial fibrillation ([place])   . Bradycardia    s/p PPM  . Cataract   . Colonic polyp    adenomatous  . Constipation   . Diverticulosis   . Hemorrhoids   . Hypertension   . Hypothyroidism   . Irritable bowel syndrome (IBS)   . Pacemaker 2010   Past Surgical History:  Procedure Laterality Date  . BREAST CYST ASPIRATION Left 1980s  . COLONOSCOPY    . ELECTROPHYSIOLOGIC STUDY N/A 07/01/2015   Procedure: Atrial Fibrillation Ablation;  Surgeon: Thompson Grayer, MD;  Location: [place] [facility];  Service: Cardiovascular;  Laterality: N/A;  . EYE SURGERY    . HEMATOMA EVACUATION Right 07/15/2014   Procedure: EVACUATION Right Groin Hematoma;  Surgeon: Rosetta Posner, MD;  Location: [place];  Service: Vascular;  Laterality: Right;  . NASAL ENDOSCOPY WITH EPISTAXIS CONTROL N/A 07/14/2014   Procedure: NASAL ENDOSCOPY WITH EPISTAXIS CONTROL;  Surgeon: Ruby Cola, MD;  Location: [place];  Service: ENT;  Laterality: N/A;  . NASAL ENDOSCOPY WITH EPISTAXIS CONTROL Bilateral 07/15/2014   Procedure: NASAL ENDOSCOPY WITH EPISTAXIS CONTROL;  Surgeon: Ruby Cola, MD;  Location: [place];  Service: ENT;  Laterality: Bilateral;  . PACEMAKER INSERTION  2010   implanted by Dr Doreatha Lew (MDT)  . PPM GENERATOR CHANGEOUT N/A 08/16/2019   Procedure: PPM GENERATOR CHANGEOUT;  Surgeon: Thompson Grayer, MD;  Location: [place] [facility];  Service: Cardiovascular;  Laterality: N/A;  . RADIOLOGY WITH ANESTHESIA N/A 07/14/2014   Procedure: RADIOLOGY WITH ANESTHESIA;  Surgeon: Medication Radiologist, MD;  Location: [place];  Service: Radiology;  Laterality: N/A;  . TEE WITHOUT CARDIOVERSION N/A 07/01/2015   Procedure: TRANSESOPHAGEAL ECHOCARDIOGRAM (TEE);  Surgeon: Sueanne Margarita, MD;  Location: [facility] ENDOSCOPY;  Service: Cardiovascular;  Laterality: N/A;    Current Outpatient Medications  Medication Sig Dispense Refill  . acetaminophen (TYLENOL) 500 MG tablet Take 500-1,000 mg by mouth every 6 (six) hours as needed for moderate pain or headache.    . Ascorbic Acid (VITAMIN C) 1000 MG tablet Take 1,000 mg by mouth daily.      . Calcium-Vitamin D (CALTRATE 600 PLUS-VIT D PO) Take  1 tablet by mouth daily.    . cholecalciferol (VITAMIN D) 1000 UNITS tablet Take 1,000 Units by mouth daily.      . Coenzyme Q10 (CO Q-10 PO) Take 1 tablet by mouth daily.    . COLLAGEN PO Take 2 Scoops by mouth daily.    Marland Kitchen diltiazem (CARDIZEM CD) 360 MG 24 hr capsule Take 1 capsule (360 mg total) by mouth daily. 90 capsule 3  . Lysine 1000 MG TABS Take 1,000 mg by mouth daily.     . methylcellulose (CITRUCEL) oral powder Take 2 packets by mouth daily.    . metoprolol succinate (TOPROL XL) 50 MG 24 hr tablet Take 75mg   in the AM and 50mg  in the PM    . Multiple Vitamins-Minerals (ICAPS AREDS 2 PO) Take 1 capsule by mouth daily.     . Omega-3 Fatty Acids (FISH OIL) 1200 MG CAPS Take 1,200 mg by mouth daily. Mega Red.     . polyethylene glycol powder (GLYCOLAX/MIRALAX) powder Take 0.5 Containers by mouth at bedtime.   0  . pravastatin (PRAVACHOL) 10 MG tablet Take 1 tablet (10 mg total) by mouth daily. 90 tablet 3  . Probiotic Product (ALIGN PO) Take 1 capsule by mouth daily.     . Rivaroxaban (XARELTO) 15 MG TABS tablet Take 1 tablet (15 mg total) by mouth daily with supper. (Patient taking differently: Take 15 mg by mouth every evening. With dinner) 90 tablet 1  . sodium chloride (OCEAN) 0.65 % SOLN nasal spray Place 1 spray into both nostrils daily.    Marland Kitchen SYNTHROID 75 MCG tablet Take 50-75 mcg by mouth See admin instructions. Alternates between 50 mcg and 75 mcg daily  2  . temazepam (RESTORIL) 30 MG capsule Take 30 mg by mouth at bedtime.     . Zinc 50 MG CAPS Take 50 mg by mouth daily.     . Artificial Tear Ointment (DRY EYES OP) Place 1 drop into both eyes daily as needed (Dry eye).     No current facility-administered medications for this encounter.    Allergies  Allergen Reactions  . Trazodone And Nefazodone     "twitching" and trembling    Social History   Socioeconomic History  . Marital status: Widowed    Spouse name: Not on file  . Number of children: 3  . Years of education: Not on file  . Highest education level: Not on file  Occupational History  . Occupation: retired  Tobacco Use  . Smoking status: Former Smoker    Types: Cigarettes    Quit date: 02/09/1980    Years since quitting: 39.5  . Smokeless tobacco: Never Used  Vaping Use  . Vaping Use: Never used  Substance and Sexual Activity  . Alcohol use: Yes    Alcohol/week: 14.0 standard drinks    Types: 14 Glasses of wine per week    Comment: wine 6 days a week  . Drug use: No  . Sexual activity: Not on file  Other Topics  Concern  . Not on file  Social History Narrative  . Not on file   Social Determinants of Health   Financial Resource Strain:   . Difficulty of Paying Living Expenses:   Food Insecurity:   . Worried About Charity fundraiser in the Last Year:   . Arboriculturist in the Last Year:   Transportation Needs:   . Film/video editor (Medical):   Marland Kitchen Lack of Transportation (Non-Medical):  Physical Activity:   . Days of Exercise per Week:   . Minutes of Exercise per Session:   Stress:   . Feeling of Stress :   Social Connections:   . Frequency of Communication with Friends and Family:   . Frequency of Social Gatherings with Friends and Family:   . Attends Religious Services:   . Active Member of Clubs or Organizations:   . Attends Archivist Meetings:   Marland Kitchen Marital Status:   Intimate Partner Violence:   . Fear of Current or Ex-Partner:   . Emotionally Abused:   Marland Kitchen Physically Abused:   . Sexually Abused:     Family History  Problem Relation Age of Onset  . Hypertension Mother   . Congestive Heart Failure Mother   . Dementia Mother   . Heart attack Father 74       MI cause of death  . Diabetes Paternal Grandfather   . CAD Brother        2 stents  . Lung cancer Son   . Leukemia Daughter   . Colon cancer Neg Hx   . Kidney disease Neg Hx   . Liver disease Neg Hx   . Esophageal cancer Neg Hx   . Stroke Neg Hx     ROS- All systems are reviewed and negative except as per the HPI above  Physical Exam: Vitals:   08/27/19 1546  BP: 130/74  Pulse: 66  SpO2: 99%  Weight: 59 kg  Height: 5\' 1"  (1.549 m)   Wt Readings from Last 3 Encounters:  08/27/19 59 kg  08/16/19 58.1 kg  06/20/19 57.2 kg    Labs: Lab Results  Component Value Date   NA 134 08/14/2019   K 4.0 08/14/2019   CL 100 08/14/2019   CO2 27 08/14/2019   GLUCOSE 98 08/14/2019   BUN 18 08/14/2019   CREATININE 0.84 08/14/2019   CALCIUM 9.1 08/14/2019   MG 2.0 07/19/2014   Lab Results    Component Value Date   INR 2.3 06/24/2015   Lab Results  Component Value Date   CHOL 167 10/08/2015   HDL 94 10/08/2015   LDLCALC 62 10/08/2015   TRIG 56 10/08/2015     GEN- The patient is well appearing, alert and oriented x 3 today.   Head- normocephalic, atraumatic Eyes-  Sclera clear, conjunctiva pink Ears- hearing intact Oropharynx- clear Neck- supple, no JVP Lymph- no cervical lymphadenopathy Lungs- Clear to ausculation bilaterally, normal work of breathing Heart- Regular rate and rhythm, no murmurs, rubs or gallops, PMI not laterally displaced GI- soft, NT, ND, + BS Extremities- no clubbing, cyanosis, or edema MS- no significant deformity or atrophy Skin- no rash or lesion Psych- euthymic mood, full affect Neuro- strength and sensation are intact  EKG-a paced at 74 bpm     Assessment and Plan: 1. afib  Recent generator change out when BB was stopped 2/2 hypotension  and issues with heart rhythm since  that time  Recent restart of BB at full dose since lower doses were not effective to control pt's sensation pf  elevated heart rate  She was encouraged to ask for device interrogation at wd check on Thursday to further understand arrhythmia  Appears  to be a paced to day, felt like she was in afib this am  Since she  Failed amiodarone, if afib increases in burden, may be a Tikosyn candidate   2. CHA2DS2VASc score of at least 5  Continue  xarelto 15  mg daily   3. Hypotension Stable today   F/u with device clinic on Thursday   Juandedios Dudash C. Kate Larock, [facility] [address] [place], [place] [address] [phone]

## 2019-08-30 ENCOUNTER — Ambulatory Visit: Payer: Medicare Other | Admitting: Emergency Medicine

## 2019-08-30 ENCOUNTER — Other Ambulatory Visit: Payer: Self-pay

## 2019-08-30 DIAGNOSIS — I495 Sick sinus syndrome: Secondary | ICD-10-CM

## 2019-08-30 LAB — CUP PACEART INCLINIC DEVICE CHECK
Battery Remaining Longevity: 156 mo
Battery Voltage: 3.22 V
Brady Statistic AP VP Percent: 3.69 %
Brady Statistic AP VS Percent: 43.77 %
Brady Statistic AS VP Percent: 8.67 %
Brady Statistic AS VS Percent: 43.88 %
Brady Statistic RA Percent Paced: 47.17 %
Brady Statistic RV Percent Paced: 13.53 %
Date Time Interrogation Session: 20210722143328
Implantable Lead Implant Date: 20101109
Implantable Lead Implant Date: 20101109
Implantable Lead Location: 753859
Implantable Lead Location: 753860
Implantable Lead Model: 4469
Implantable Lead Model: 4470
Implantable Lead Serial Number: 535369
Implantable Lead Serial Number: 657967
Implantable Pulse Generator Implant Date: 20210708
Lead Channel Impedance Value: 285 Ohm
Lead Channel Impedance Value: 399 Ohm
Lead Channel Impedance Value: 437 Ohm
Lead Channel Impedance Value: 456 Ohm
Lead Channel Pacing Threshold Amplitude: 0.5 V
Lead Channel Pacing Threshold Amplitude: 1.125 V
Lead Channel Pacing Threshold Pulse Width: 0.4 ms
Lead Channel Pacing Threshold Pulse Width: 0.4 ms
Lead Channel Sensing Intrinsic Amplitude: 0.25 mV
Lead Channel Sensing Intrinsic Amplitude: 11.125 mV
Lead Channel Setting Pacing Amplitude: 2.25 V
Lead Channel Setting Pacing Amplitude: 2.5 V
Lead Channel Setting Pacing Pulse Width: 0.4 ms
Lead Channel Setting Sensing Sensitivity: 1.2 mV

## 2019-08-30 NOTE — Progress Notes (Signed)
Wound check appointment. Steri-strips removed. Wound without redness or edema. Incision edges approximated, wound well healed. Normal device function. Thresholds, sensing, and impedances consistent with implant measurements. Histogram distribution appropriate for patient and level of activity. AT/AF burden 1.9%, + Xarelto, longest episode 18 minutes. No high ventricular rates noted. Patient educated about wound care. ROV in 3 months with Dr. Rayann Heman. Next home remote 09/16/19.   Patient complains of heart rate elevated often and she reports of feeling it in her chest. Patient denies any specific symptoms. Patient presented AS/VS 15. During end of visit patient converted on her own to AP/VS 24. States she felt better and like she used to before device was placed. Consulted [employer] and states there any no further changes to be done with device programming. Advised patient I will forward this information to the AF clinic since she has previously worked with them. Patient agreeable to plan.

## 2019-09-05 ENCOUNTER — Other Ambulatory Visit: Payer: Self-pay

## 2019-09-05 ENCOUNTER — Ambulatory Visit: Payer: Medicare Other | Admitting: Cardiovascular Disease

## 2019-09-05 VITALS — BP 126/76 | HR 91 | Ht 60.0 in | Wt 129.0 lb

## 2019-09-05 DIAGNOSIS — I4891 Unspecified atrial fibrillation: Secondary | ICD-10-CM

## 2019-09-05 DIAGNOSIS — I480 Paroxysmal atrial fibrillation: Secondary | ICD-10-CM

## 2019-09-05 MED ORDER — METOPROLOL SUCCINATE ER 50 MG PO TB24: ORAL_TABLET | ORAL | 3 refills | Status: DC

## 2019-09-05 NOTE — Progress Notes (Signed)
Cardiology Office Note   Date:  09/05/2019   ID:  Kathryn Gibson, DOB November 07, 1933, MRN [id number]  PCP:  Prince Solian, MD  Cardiologist:   [name], MD  Electrophysiologist: Thompson Grayer  No chief complaint on file.    History of Present Illness: Kathryn Gibson is a 86 y.o. female with atrial fibrillation s/p ablation, SSS s/p PPM, asymptomatic coronary calcifications, and  hypertension, who presents for follow-up.  Kathryn Gibson  was previously a patient of Dr. Mare Ferrari.  She underwent ablation for atrial fibrillation on 07/01/15.  She last had an episode of atrial fibrillation 12/2015.  She has been doing well and rarely has palpitations that don't last very long.  She is nearing ERI and her PPM batter is followed monthly.  She continues to exercise nearly every day per week.  She has no exertional symptoms.  She has mild edema attributable to varicose veins that is better when she uses compression stockings.  She has no orthopnea or PND.  She last saw Dr. Rayann Heman 03/2018 and was doing well.   Amiodarone was previously increased due to atrial tachycardia.  This was discontinued due to elevated LFTs which subsequently normalized.  She saw Dr. Margaretann Loveless 07/2018 and reported exertional dyspnea.  Dr. Margaretann Loveless offered stress testing which she wanted to think about.  Her device was last interrogated 01/08/19 and was nearing ERI.  There were no episodes of atrial fibrillation noted.   Since her last appointment she had her pacemaker generator changed out for ERI.  Her beta-blocker was discontinued due to hypotension.  She has had some episodes of atrial fibrillation with RVR.  She was seen in the atrial fibrillation clinic 08/27/2019.  Her beta-blocker was resumed and she was feeling better at that time.  It was thought that if she continued to have issues she may be a candidate for Tikosyn.  She did not tolerate amiodarone in the past.  She had a wound check on 7/22.  She had a 1.9% atrial  fibrillation burden with the longest episode lasting 18 minutes.  She has been tracking her BP and occasionally it is as low as the 90s/50s.  She doesn't feel bad at that time.  Her heart rate has been going up as high as 101 or 120.  She notes that prior to her afternoon dose of metoprolol her heart tends to start racing.  She has also struggled with bronchitis for two weeks.  She isn't able to get back to her walking because of getting tired.   Past Medical History:  Diagnosis Date  . Arthritis   . Atrial fibrillation ([place])   . Bradycardia    s/p PPM  . Cataract   . Colonic polyp    adenomatous  . Constipation   . Diverticulosis   . Hemorrhoids   . Hypertension   . Hypothyroidism   . Irritable bowel syndrome (IBS)   . Pacemaker 2010    Past Surgical History:  Procedure Laterality Date  . BREAST CYST ASPIRATION Left 1980s  . COLONOSCOPY    . ELECTROPHYSIOLOGIC STUDY N/A 07/01/2015   Procedure: Atrial Fibrillation Ablation;  Surgeon: Thompson Grayer, MD;  Location: [place] [facility];  Service: Cardiovascular;  Laterality: N/A;  . EYE SURGERY    . HEMATOMA EVACUATION Right 07/15/2014   Procedure: EVACUATION Right Groin Hematoma;  Surgeon: Rosetta Posner, MD;  Location: [place];  Service: Vascular;  Laterality: Right;  . NASAL ENDOSCOPY WITH EPISTAXIS CONTROL N/A 07/14/2014  Procedure: NASAL ENDOSCOPY WITH EPISTAXIS CONTROL;  Surgeon: Ruby Cola, MD;  Location: [place];  Service: ENT;  Laterality: N/A;  . NASAL ENDOSCOPY WITH EPISTAXIS CONTROL Bilateral 07/15/2014   Procedure: NASAL ENDOSCOPY WITH EPISTAXIS CONTROL;  Surgeon: Ruby Cola, MD;  Location: [place];  Service: ENT;  Laterality: Bilateral;  . PACEMAKER INSERTION  2010   implanted by Dr Doreatha Lew (MDT)  . PPM GENERATOR CHANGEOUT N/A 08/16/2019   Procedure: PPM GENERATOR CHANGEOUT;  Surgeon: Thompson Grayer, MD;  Location: [place] [facility];  Service: Cardiovascular;  Laterality: N/A;  . RADIOLOGY WITH ANESTHESIA N/A 07/14/2014    Procedure: RADIOLOGY WITH ANESTHESIA;  Surgeon: Medication Radiologist, MD;  Location: [place];  Service: Radiology;  Laterality: N/A;  . TEE WITHOUT CARDIOVERSION N/A 07/01/2015   Procedure: TRANSESOPHAGEAL ECHOCARDIOGRAM (TEE);  Surgeon: Sueanne Margarita, MD;  Location: [facility];  Service: Cardiovascular;  Laterality: N/A;     Current Outpatient Medications  Medication Sig Dispense Refill  . acetaminophen (TYLENOL) 500 MG tablet Take 500-1,000 mg by mouth every 6 (six) hours as needed for moderate pain or headache.    . Artificial Tear Ointment (DRY EYES OP) Place 1 drop into both eyes daily as needed (Dry eye).    . Ascorbic Acid (VITAMIN C) 1000 MG tablet Take 1,000 mg by mouth daily.      . Calcium-Vitamin D (CALTRATE 600 PLUS-VIT D PO) Take 1 tablet by mouth daily.    . cholecalciferol (VITAMIN D) 1000 UNITS tablet Take 1,000 Units by mouth daily.      . Coenzyme Q10 (CO Q-10 PO) Take 1 tablet by mouth daily.    . COLLAGEN PO Take 2 Scoops by mouth daily.    Marland Kitchen diltiazem (CARDIZEM CD) 360 MG 24 hr capsule Take 1 capsule (360 mg total) by mouth daily. 90 capsule 3  . Lysine 1000 MG TABS Take 1,000 mg by mouth daily.     . methylcellulose (CITRUCEL) oral powder Take 2 packets by mouth daily.    . metoprolol succinate (TOPROL XL) 50 MG 24 hr tablet Take 75mg  TWICE A DAY 270 tablet 3  . Multiple Vitamins-Minerals (ICAPS AREDS 2 PO) Take 1 capsule by mouth daily.     . Omega-3 Fatty Acids (FISH OIL) 1200 MG CAPS Take 1,200 mg by mouth daily. Mega Red.     . polyethylene glycol powder (GLYCOLAX/MIRALAX) powder Take 0.5 Containers by mouth at bedtime.   0  . pravastatin (PRAVACHOL) 10 MG tablet Take 1 tablet (10 mg total) by mouth daily. 90 tablet 3  . Probiotic Product (ALIGN PO) Take 1 capsule by mouth daily.     . Rivaroxaban (XARELTO) 15 MG TABS tablet Take 1 tablet (15 mg total) by mouth daily with supper. (Patient taking differently: Take 15 mg by mouth every evening. With dinner) 90  tablet 1  . sodium chloride (OCEAN) 0.65 % SOLN nasal spray Place 1 spray into both nostrils daily.    Marland Kitchen SYNTHROID 75 MCG tablet Take 50-75 mcg by mouth See admin instructions. Alternates between 50 mcg and 75 mcg daily  2  . temazepam (RESTORIL) 30 MG capsule Take 30 mg by mouth at bedtime.     . Zinc 50 MG CAPS Take 50 mg by mouth daily.      No current facility-administered medications for this visit.    Allergies:   Trazodone and nefazodone    Social History:  The patient  reports that she quit smoking about 39 years ago. Her smoking  use included cigarettes. She has never used smokeless tobacco. She reports current alcohol use of about 14.0 standard drinks of alcohol per week. She reports that she does not use drugs.   Family History:  The patient's family history includes CAD in her brother; Congestive Heart Failure in her mother; Dementia in her mother; Diabetes in her paternal grandfather; Heart attack (age of onset: 43) in her father; Hypertension in her mother; Leukemia in her daughter; Lung cancer in her son.    ROS:  Please see the history of present illness.   Otherwise, review of systems are positive for none.   All other systems are reviewed and negative.    PHYSICAL EXAM: VS:  BP 126/76   Pulse 91   Ht 5' (1.524 m)   Wt 129 lb (58.5 kg)   SpO2 98%   BMI 25.19 kg/m  , BMI Body mass index is 25.19 kg/m. GENERAL:  Well appearing HEENT: Pupils equal round and reactive, fundi not visualized, oral mucosa unremarkable NECK:  No jugular venous distention, waveform within normal limits, carotid upstroke brisk and symmetric, no bruits CHEST: R upper chest PPM incision well-healed.  No erythema, tenderness or drainage LUNGS:  Clear to auscultation bilaterally HEART:  RRR.  PMI not displaced or sustained,S1 and S2 within normal limits, no S3, no S4, no clicks, no rubs, no murmurs ABD:  Flat, positive bowel sounds normal in frequency in pitch, no bruits, no rebound, no guarding,  no midline pulsatile mass, no hepatomegaly, no splenomegaly EXT:  2 plus pulses throughout, no edema, no cyanosis no clubbing SKIN:  No rashes no nodules NEURO:  Cranial nerves II through XII grossly intact, motor grossly intact throughout PSYCH:  Cognitively intact, oriented to person place and time   EKG:  EKG is ordered today. 12/07/16: AP VS.  Rate 65 bpm.  Non-specific t wave abnormalities.   11/18/17: AP VS.  Prolonged AV delay.  Non-specific t wave abnormalities.  01/25/19: APVS.  Long AV delay.  Rate 61 bpm.   09/05/2019: Sinus rhythm.  Rate 91 bpm.  First-degree AV block.  Nonspecific T wave abnormalities.  Echo 06/05/15: Study Conclusions  - Left ventricle: The cavity size was normal. Wall thickness was  normal. Systolic function was normal. The estimated ejection  fraction was in the range of 55% to 60%. Wall motion was normal;  there were no regional wall motion abnormalities. - Aortic valve: Trileaflet; mildly thickened, mildly calcified  leaflets. There was mild regurgitation. - Mitral valve: Calcified annulus. - Left atrium: The atrium was mildly dilated. - Right ventricle: Pacer wire or catheter noted in right ventricle.  Impressions:  - Compared to the prior study, there has been no significant  interval change.   Recent Labs: 08/14/2019: BUN 18; Creatinine, Ser 0.84; Hemoglobin 11.9; Platelets 213; Potassium 4.0; Sodium 134  02/28/15: TSH 7.24, free T4 130  04/02/16: Sodium 136, potassium 4.3, BUN 22, creatinine 0.8 WBC 6.91, hemoglobin, 12.9, hematocrit 41.2, platelets 208 Total cholesterol 163, triglycerides 45, HDL 70, LDL 84 TSH 2.12, free T4 1.3  03/23/2017:  Total cholesterol 161, triglycerides 46, HDL 73, LDL 79 ALT 25  Lipid Panel    Component Value Date/Time   CHOL 167 10/08/2015 0848   TRIG 56 10/08/2015 0848   HDL 94 10/08/2015 0848   CHOLHDL 1.8 10/08/2015 0848   VLDL 11 10/08/2015 0848   LDLCALC 62 10/08/2015 0848    05/09/2019: Total cholesterol 176, triglycerides 56, HDL 73, LDL 92    Wt Readings  from Last 3 Encounters:  09/05/19 129 lb (58.5 kg)  08/27/19 130 lb (59 kg)  08/16/19 128 lb (58.1 kg)     ASSESSMENT AND PLAN:  # Atrial fibrillation:  Ms. Werntz is currently in sinus rhythm.  She reports that she is feeling terribly with her heart rates in the 90s, however it is sinus rhythm. She is frustrated that she feels differently than prior to her device change out and attributes this to the changes in her medications.  However meds were appropriately held in the setting of hypotension.  Her overall her atrial fibrillation burden has been quite low at 1.9%.  It seems as though she does to get symptomatic when flipping between sinus and A. fib.  There is been some discussion about starting her on another antiarrhythmic.  However she does not want to go in the hospital for [place].  Given her age, I would be somewhat hesitant about starting her on sotalol.  No dronedarone given her history of elevated LFTs on amiodarone.  She is scheduled to see Dr. Rayann Heman in October.  For now we will try increasing her p.m. metoprolol to 75 mg.  Continue diltiazem.  This patients CHA2DS2-VASc Score and unadjusted Ischemic Stroke Rate (% per year) is equal to 4.8 % stroke rate/year from a score of 4 Above score calculated as 1 point each if present [CHF, HTN, DM, Vascular=MI/PAD/Aortic Plaque, Age if 65-74, or Female] Above score calculated as 2 points each if present [Age > 75, or Stroke/TIA/TE]  # Hyperlipidemia: ASCVD 10 year risk is 34%.  She reports myalgias with rosuvastatin.  She is tolerating pravastatin.  Lipids are reasonably well-controlled.  Given her recent transaminitis, we will not increase pravastatin at this time.  # Asymptomatic coronary calcification: Asymptomatic continue metoprolol.  She is not on aspirin given that she is on Xarelto.   Current medicines are reviewed at length with the patient  today.  The patient does not have concerns regarding medicines.  The following changes have been made:  None  Time spent: 40 minutes-Greater than 50% of this time was spent in counseling, explanation of diagnosis, planning of further management, and coordination of care.   Labs/ tests ordered today include:   Orders Placed This Encounter  Procedures  . EKG 12-Lead    Disposition:   FU with Alexande Sheerin C. Oval Linsey, MD, [facility] in 1 year     Signed, Jancy Sprankle C. Oval Linsey, MD, [facility]  09/05/2019 10:39 AM    [place]

## 2019-09-05 NOTE — Patient Instructions (Signed)
Medication Instructions:  INCREASE YOUR METOPROLOL TO 75 MG TWICE A DAY  TAKE YOUR DILTIAZEM IN THE MORNING   *If you need a refill on your cardiac medications before your next appointment, please call your pharmacy*  Lab Work: NONE  Testing/Procedures: NONE  Follow-Up: At [facility], you and your health needs are our priority.  As part of our continuing mission to provide you with exceptional heart care, we have created designated Provider Care Teams.  These Care Teams include your primary Cardiologist (physician) and Advanced Practice Providers (APPs -  Physician Assistants and Nurse Practitioners) who all work together to provide you with the care you need, when you need it.  We recommend signing up for the patient portal called "MyChart".  Sign up information is provided on this After Visit Summary.  MyChart is used to connect with patients for Virtual Visits (Telemedicine).  Patients are able to view lab/test results, encounter notes, upcoming appointments, etc.  Non-urgent messages can be sent to your provider as well.   To learn more about what you can do with MyChart, go to [email or web address].    Your next appointment:   12 month(s) You will receive a reminder letter in the mail two months in advance. If you don't receive a letter, please call our office to schedule the follow-up appointment.  The format for your next appointment:   In Person  Provider:   You may see [name], MD or one of the following Advanced Practice Providers on your designated Care Team:    Kerin Ransom, PA-C  [place], [place]  Coletta Memos, [place]

## 2019-09-18 ENCOUNTER — Ambulatory Visit: Payer: Medicare Other | Admitting: Podiatry

## 2019-09-18 ENCOUNTER — Other Ambulatory Visit: Payer: Self-pay

## 2019-09-18 ENCOUNTER — Ambulatory Visit (INDEPENDENT_AMBULATORY_CARE_PROVIDER_SITE_OTHER): Payer: Medicare Other

## 2019-09-18 ENCOUNTER — Encounter: Payer: Self-pay | Admitting: Podiatry

## 2019-09-18 DIAGNOSIS — M7672 Peroneal tendinitis, left leg: Secondary | ICD-10-CM

## 2019-09-18 DIAGNOSIS — G5782 Other specified mononeuropathies of left lower limb: Secondary | ICD-10-CM

## 2019-09-18 DIAGNOSIS — A6000 Herpesviral infection of urogenital system, unspecified: Secondary | ICD-10-CM | POA: Insufficient documentation

## 2019-09-18 DIAGNOSIS — M778 Other enthesopathies, not elsewhere classified: Secondary | ICD-10-CM

## 2019-09-19 ENCOUNTER — Other Ambulatory Visit: Payer: Self-pay | Admitting: Podiatry

## 2019-09-19 DIAGNOSIS — M7672 Peroneal tendinitis, left leg: Secondary | ICD-10-CM

## 2019-09-19 NOTE — Progress Notes (Signed)
Subjective:  Patient ID: Kathryn Gibson, female    DOB: 06-Oct-1933,  MRN: [id number] HPI Chief Complaint  Patient presents with  . Foot Pain    Lateral side left - sharp, electrical sensations x 12 years, car door shut on it years ago, some plantar heel pain  . New Patient (Initial Visit)    Est pt 2017    86 y.o. female presents with the above complaint.   ROS: Denies fever chills nausea vomiting muscle aches pains calf pain back pain chest pain shortness of breath.  Past Medical History:  Diagnosis Date  . Arthritis   . Atrial fibrillation ([place])   . Bradycardia    s/p PPM  . Cataract   . Colonic polyp    adenomatous  . Constipation   . Diverticulosis   . Hemorrhoids   . Hypertension   . Hypothyroidism   . Irritable bowel syndrome (IBS)   . Pacemaker 2010   Past Surgical History:  Procedure Laterality Date  . BREAST CYST ASPIRATION Left 1980s  . COLONOSCOPY    . ELECTROPHYSIOLOGIC STUDY N/A 07/01/2015   Procedure: Atrial Fibrillation Ablation;  Surgeon: Thompson Grayer, MD;  Location: [place] [facility];  Service: Cardiovascular;  Laterality: N/A;  . EYE SURGERY    . HEMATOMA EVACUATION Right 07/15/2014   Procedure: EVACUATION Right Groin Hematoma;  Surgeon: Rosetta Posner, MD;  Location: [place];  Service: Vascular;  Laterality: Right;  . NASAL ENDOSCOPY WITH EPISTAXIS CONTROL N/A 07/14/2014   Procedure: NASAL ENDOSCOPY WITH EPISTAXIS CONTROL;  Surgeon: Ruby Cola, MD;  Location: [place];  Service: ENT;  Laterality: N/A;  . NASAL ENDOSCOPY WITH EPISTAXIS CONTROL Bilateral 07/15/2014   Procedure: NASAL ENDOSCOPY WITH EPISTAXIS CONTROL;  Surgeon: Ruby Cola, MD;  Location: [place];  Service: ENT;  Laterality: Bilateral;  . PACEMAKER INSERTION  2010   implanted by Dr Doreatha Lew (MDT)  . PPM GENERATOR CHANGEOUT N/A 08/16/2019   Procedure: PPM GENERATOR CHANGEOUT;  Surgeon: Thompson Grayer, MD;  Location: [place] [facility];  Service: Cardiovascular;  Laterality: N/A;  . RADIOLOGY WITH  ANESTHESIA N/A 07/14/2014   Procedure: RADIOLOGY WITH ANESTHESIA;  Surgeon: Medication Radiologist, MD;  Location: [place];  Service: Radiology;  Laterality: N/A;  . TEE WITHOUT CARDIOVERSION N/A 07/01/2015   Procedure: TRANSESOPHAGEAL ECHOCARDIOGRAM (TEE);  Surgeon: Sueanne Margarita, MD;  Location: [facility];  Service: Cardiovascular;  Laterality: N/A;    Current Outpatient Medications:  .  acetaminophen (TYLENOL) 500 MG tablet, Take 500-1,000 mg by mouth every 6 (six) hours as needed for moderate pain or headache., Disp: , Rfl:  .  Artificial Tear Ointment (DRY EYES OP), Place 1 drop into both eyes daily as needed (Dry eye)., Disp: , Rfl:  .  Ascorbic Acid (VITAMIN C) 1000 MG tablet, Take 1,000 mg by mouth daily.  , Disp: , Rfl:  .  Calcium-Vitamin D (CALTRATE 600 PLUS-VIT D PO), Take 1 tablet by mouth daily., Disp: , Rfl:  .  cholecalciferol (VITAMIN D) 1000 UNITS tablet, Take 1,000 Units by mouth daily.  , Disp: , Rfl:  .  Coenzyme Q10 (CO Q-10 PO), Take 1 tablet by mouth daily., Disp: , Rfl:  .  COLLAGEN PO, Take 2 Scoops by mouth daily., Disp: , Rfl:  .  diltiazem (CARDIZEM CD) 360 MG 24 hr capsule, Take 1 capsule (360 mg total) by mouth daily., Disp: 90 capsule, Rfl: 3 .  Lysine 1000 MG TABS, Take 1,000 mg by mouth daily. , Disp: , Rfl:  .  methylcellulose (CITRUCEL) oral powder, Take 2 packets by mouth daily., Disp: , Rfl:  .  metoprolol succinate (TOPROL XL) 50 MG 24 hr tablet, Take 75mg  TWICE A DAY, Disp: 270 tablet, Rfl: 3 .  Multiple Vitamins-Minerals (ICAPS AREDS 2 PO), Take 1 capsule by mouth daily. , Disp: , Rfl:  .  Omega-3 Fatty Acids (FISH OIL) 1200 MG CAPS, Take 1,200 mg by mouth daily. Mega Red. , Disp: , Rfl:  .  polyethylene glycol powder (GLYCOLAX/MIRALAX) powder, Take 0.5 Containers by mouth at bedtime. , Disp: , Rfl: 0 .  pravastatin (PRAVACHOL) 10 MG tablet, Take 1 tablet (10 mg total) by mouth daily., Disp: 90 tablet, Rfl: 3 .  Probiotic Product (ALIGN PO), Take 1 capsule  by mouth daily. , Disp: , Rfl:  .  Rivaroxaban (XARELTO) 15 MG TABS tablet, Take 1 tablet (15 mg total) by mouth daily with supper. (Patient taking differently: Take 15 mg by mouth every evening. With dinner), Disp: 90 tablet, Rfl: 1 .  sodium chloride (OCEAN) 0.65 % SOLN nasal spray, Place 1 spray into both nostrils daily., Disp: , Rfl:  .  SYNTHROID 50 MCG tablet, Take by mouth., Disp: , Rfl:  .  temazepam (RESTORIL) 30 MG capsule, Take 30 mg by mouth at bedtime. , Disp: , Rfl:  .  Zinc 50 MG CAPS, Take 50 mg by mouth daily. , Disp: , Rfl:   Allergies  Allergen Reactions  . Trazodone And Nefazodone     "twitching" and trembling   Review of Systems Objective:  There were no vitals filed for this visit.  General: Well developed, nourished, in no acute distress, alert and oriented x3   Dermatological: Skin is warm, dry and supple bilateral. Nails x 10 are well maintained; remaining integument appears unremarkable at this time. There are no open sores, no preulcerative lesions, no rash or signs of infection present.  Vascular: Dorsalis Pedis artery and Posterior Tibial artery pedal pulses are 2/4 bilateral with immedate capillary fill time. Pedal hair growth present. No varicosities and no lower extremity edema present bilateral.   Neruologic: Grossly intact via light touch bilateral. Vibratory intact via tuning fork bilateral. Protective threshold with Semmes Wienstein monofilament intact to all pedal sites bilateral. Patellar and Achilles deep tendon reflexes 2+ bilateral. No Babinski or clonus noted bilateral.   Musculoskeletal: No gross boney pedal deformities bilateral. No pain, crepitus, or limitation noted with foot and ankle range of motion bilateral. Muscular strength 5/5 in all groups tested bilateral.  She has tenderness on palpation inferior lateral malleolar region proximal to the insertion of the peroneus brevis.  Gait: Unassisted, Nonantalgic.    Radiographs:  Radiographs  taken today do not demonstrate any significant osseous abnormalities in the area in question along the sural nerve distribution or the peroneal tendons left foot.  Though she does have a small area of thickening or soft tissue increase in density just proximal to the insertion site of the peroneal tendon that area is mildly tender to her on palpation.  Assessment & Plan:   Assessment: At this point it appears that there is some scar tissue or peroneal tendinitis or even sural nerve neuritis in the left foot just beneath the lateral malleolus.  Plan: At this point I injected the area with 20 mg Kenalog 5 mg Marcaine point maximal tenderness.  Tolerated procedure well without complications we will follow-up with her in the near future.     Terrace Chiem T. [place], [place]

## 2019-09-21 ENCOUNTER — Telehealth: Payer: Self-pay

## 2019-09-21 NOTE — Telephone Encounter (Signed)
Spoke with pt. She states that her HR is elevated at times. She has recorded HRs up to 108bpm. Correlates this with a sensation of hard, uncomfortable heartbeats. Feels like it gets better and worse at times, but has been an issue since early July. Denies increased SOB, chest discomfort, dizziness, or other symptoms. Amiodarone d/c in 03/2019. Reports compliance with Cardizem CD 360mg  daily, Toprol XL 75mg  BID, and Xarelto 15mg  daily. Manual pulse rate 100bpm while on the phone.  Advised that [facility] network is currently unavailable due to update, so unable to review a transmission at this time. Per transmission received 8/7, presenting rhythm shows PACs. Histograms suggest increased binning in 90-110bpm range, though FFRWs also noted on stored AT/AF EGMs. Offered pt appointment this afternoon with R. Charlcie Cradle, PA-C, for device interrogation, but pt declines as she has another appointment this afternoon. She states she is reassured by this information. Scheduled for appointment with A. Tillery, PA-C, on 10/03/19. ED precautions reviewed, pt verbalizes understanding.  Called pt back to request manual transmission as [facility] network is back up. Transmission received. Presenting rhythm AP/VP 107bpm. 3.0% AT/AF burden since 09/15/19, available EGMs show FFRWs and AT, longest episode reportedly 77min, but likely underestimated due to P-waves in refractory. AP 30.3%, VP 18.9%. Histograms suggest slight right-shift, may also benefit from reprogramming rate response to be less aggressive. Pt aware that transmission was received and aware to keep f/u with Chalmers Cater, PA-C for possible reprogramming. She denies additional questions or concerns at this time.

## 2019-09-21 NOTE — Telephone Encounter (Signed)
The pt states her heart rate is 108 bpm. The pt states her heart rate since 08-17-2019. She can feel it. It feels like her heart coming out her chest. It is very uncomfortable. She wants to know if that is normal and what can be done to stop it.

## 2019-09-21 NOTE — Telephone Encounter (Signed)
Noted. Will review at office visit 8/25

## 2019-10-01 DIAGNOSIS — M9903 Segmental and somatic dysfunction of lumbar region: Secondary | ICD-10-CM | POA: Diagnosis not present

## 2019-10-01 DIAGNOSIS — M5116 Intervertebral disc disorders with radiculopathy, lumbar region: Secondary | ICD-10-CM | POA: Diagnosis not present

## 2019-10-02 NOTE — Progress Notes (Signed)
Electrophysiology Office Note Date: 10/03/2019  ID:  Kathryn, Gibson [birth year], MRN [id number]  PCP: Prince Solian, MD Primary Cardiologist: [name], MD Electrophysiologist: Thompson Grayer, MD   CC: Pacemaker follow-up  Kathryn Gibson is a 86 y.o. female seen today for Thompson Grayer, MD for acute visit due to palpitations.  Since last being seen in our clinic the patient reports daily palpitations with or without activity. She feels like this has increased greatly since her generator change.  she denies chest pain, dyspnea, PND, orthopnea, nausea, vomiting, dizziness, syncope, edema, weight gain, or early satiety.  AFib hx 07/01/2015: PVI ablation, including Mitral isthmus and Cavo-tricuspid isthmus ablation noting Multiple atypical atrial flutter circuits observed too unstable for mapping  Has had an ATach AAD  Amiodarone goes back at least to 2012 (as far as I can find notes), remains active drug for her  Device information  MDT dual chamber PPM implanted 12/17/2008, gen change 08/2019  Past Medical History:  Diagnosis Date  . Arthritis   . Atrial fibrillation ([place])   . Bradycardia    s/p PPM  . Cataract   . Colonic polyp    adenomatous  . Constipation   . Diverticulosis   . Hemorrhoids   . Hypertension   . Hypothyroidism   . Irritable bowel syndrome (IBS)   . Pacemaker 2010   Past Surgical History:  Procedure Laterality Date  . BREAST CYST ASPIRATION Left 1980s  . COLONOSCOPY    . ELECTROPHYSIOLOGIC STUDY N/A 07/01/2015   Procedure: Atrial Fibrillation Ablation;  Surgeon: Thompson Grayer, MD;  Location: [facility];  Service: Cardiovascular;  Laterality: N/A;  . EYE SURGERY    . HEMATOMA EVACUATION Right 07/15/2014   Procedure: EVACUATION Right Groin Hematoma;  Surgeon: Rosetta Posner, MD;  Location: [facility];  Service: Vascular;  Laterality: Right;  . NASAL ENDOSCOPY WITH EPISTAXIS CONTROL N/A 07/14/2014   Procedure: NASAL ENDOSCOPY WITH EPISTAXIS  CONTROL;  Surgeon: Ruby Cola, MD;  Location: [facility];  Service: ENT;  Laterality: N/A;  . NASAL ENDOSCOPY WITH EPISTAXIS CONTROL Bilateral 07/15/2014   Procedure: NASAL ENDOSCOPY WITH EPISTAXIS CONTROL;  Surgeon: Ruby Cola, MD;  Location: [place] [facility];  Service: ENT;  Laterality: Bilateral;  . PACEMAKER INSERTION  2010   implanted by Dr Doreatha Lew (MDT)  . PPM GENERATOR CHANGEOUT N/A 08/16/2019   Procedure: PPM GENERATOR CHANGEOUT;  Surgeon: Thompson Grayer, MD;  Location: [facility];  Service: Cardiovascular;  Laterality: N/A;  . RADIOLOGY WITH ANESTHESIA N/A 07/14/2014   Procedure: RADIOLOGY WITH ANESTHESIA;  Surgeon: Medication Radiologist, MD;  Location: [place] [facility];  Service: Radiology;  Laterality: N/A;  . TEE WITHOUT CARDIOVERSION N/A 07/01/2015   Procedure: TRANSESOPHAGEAL ECHOCARDIOGRAM (TEE);  Surgeon: Sueanne Margarita, MD;  Location: [facility];  Service: Cardiovascular;  Laterality: N/A;    Current Outpatient Medications  Medication Sig Dispense Refill  . acetaminophen (TYLENOL) 500 MG tablet Take 500-1,000 mg by mouth every 6 (six) hours as needed for moderate pain or headache.    . Artificial Tear Ointment (DRY EYES OP) Place 1 drop into both eyes daily as needed (Dry eye).    . Ascorbic Acid (VITAMIN C) 1000 MG tablet Take 1,000 mg by mouth daily.      . Calcium-Vitamin D (CALTRATE 600 PLUS-VIT D PO) Take 1 tablet by mouth daily.    . cholecalciferol (VITAMIN D) 1000 UNITS tablet Take 1,000 Units by mouth daily.      . Coenzyme Q10 (CO  Q-10 PO) Take 1 tablet by mouth daily.    . COLLAGEN PO Take 2 Scoops by mouth daily.    Marland Kitchen diltiazem (CARDIZEM CD) 360 MG 24 hr capsule Take 1 capsule (360 mg total) by mouth daily. 90 capsule 3  . levothyroxine (SYNTHROID) 75 MCG tablet Take 75 mcg by mouth daily before breakfast. Alternate with 50 mcg    . Lysine 1000 MG TABS Take 1,000 mg by mouth daily.     . methylcellulose (CITRUCEL) oral powder Take 2 packets by mouth daily.    . metoprolol  succinate (TOPROL XL) 50 MG 24 hr tablet Take 75mg  TWICE A DAY 270 tablet 3  . Multiple Vitamins-Minerals (ICAPS AREDS 2 PO) Take 1 capsule by mouth daily.     . Omega-3 Fatty Acids (FISH OIL) 1200 MG CAPS Take 1,200 mg by mouth daily. Mega Red.     . polyethylene glycol powder (GLYCOLAX/MIRALAX) powder Take 0.5 Containers by mouth at bedtime.   0  . pravastatin (PRAVACHOL) 10 MG tablet Take 1 tablet (10 mg total) by mouth daily. 90 tablet 3  . Probiotic Product (ALIGN PO) Take 1 capsule by mouth daily.     . Rivaroxaban (XARELTO) 15 MG TABS tablet Take 1 tablet (15 mg total) by mouth daily with supper. 90 tablet 1  . sodium chloride (OCEAN) 0.65 % SOLN nasal spray Place 1 spray into both nostrils daily.    Marland Kitchen SYNTHROID 50 MCG tablet Take 50 mcg by mouth daily. Alternate with 2mcg    . temazepam (RESTORIL) 30 MG capsule Take 30 mg by mouth at bedtime.     . Zinc 50 MG CAPS Take 50 mg by mouth daily.      No current facility-administered medications for this visit.    Allergies:   Trazodone and nefazodone   Social History: Social History   Socioeconomic History  . Marital status: Widowed    Spouse name: Not on file  . Number of children: 3  . Years of education: Not on file  . Highest education level: Not on file  Occupational History  . Occupation: retired  Tobacco Use  . Smoking status: Former Smoker    Types: Cigarettes    Quit date: 02/09/1980    Years since quitting: 39.6  . Smokeless tobacco: Never Used  Vaping Use  . Vaping Use: Never used  Substance and Sexual Activity  . Alcohol use: Yes    Alcohol/week: 14.0 standard drinks    Types: 14 Glasses of wine per week    Comment: wine 6 days a week  . Drug use: No  . Sexual activity: Not on file  Other Topics Concern  . Not on file  Social History Narrative  . Not on file   Social Determinants of Health   Financial Resource Strain:   . Difficulty of Paying Living Expenses: Not on file  Food Insecurity:   . Worried  About Charity fundraiser in the Last Year: Not on file  . Ran Out of Food in the Last Year: Not on file  Transportation Needs:   . Lack of Transportation (Medical): Not on file  . Lack of Transportation (Non-Medical): Not on file  Physical Activity:   . Days of Exercise per Week: Not on file  . Minutes of Exercise per Session: Not on file  Stress:   . Feeling of Stress : Not on file  Social Connections:   . Frequency of Communication with Friends and Family: Not on file  .  Frequency of Social Gatherings with Friends and Family: Not on file  . Attends Religious Services: Not on file  . Active Member of Clubs or Organizations: Not on file  . Attends Archivist Meetings: Not on file  . Marital Status: Not on file  Intimate Partner Violence:   . Fear of Current or Ex-Partner: Not on file  . Emotionally Abused: Not on file  . Physically Abused: Not on file  . Sexually Abused: Not on file    Family History: Family History  Problem Relation Age of Onset  . Hypertension Mother   . Congestive Heart Failure Mother   . Dementia Mother   . Heart attack Father 87       MI cause of death  . Diabetes Paternal Grandfather   . CAD Brother        2 stents  . Lung cancer Son   . Leukemia Daughter   . Colon cancer Neg Hx   . Kidney disease Neg Hx   . Liver disease Neg Hx   . Esophageal cancer Neg Hx   . Stroke Neg Hx      Review of Systems: All other systems reviewed and are otherwise negative except as noted above.  Physical Exam: Vitals:   10/03/19 1023  BP: 110/70  Pulse: (!) 118  SpO2: 96%  Weight: 124 lb (56.2 kg)  Height: 5' (1.524 m)     GEN- The patient is well appearing, alert and oriented x 3 today.   HEENT: normocephalic, atraumatic; sclera clear, conjunctiva pink; hearing intact; oropharynx clear; neck supple  Lungs- Clear to ausculation bilaterally, normal work of breathing.  No wheezes, rales, rhonchi Heart- Regular rate and rhythm, no murmurs, rubs  or gallops  GI- soft, non-tender, non-distended, bowel sounds present  Extremities- no clubbing or cyanosis. No edema MS- no significant deformity or atrophy Skin- warm and dry, no rash or lesion; PPM pocket well healed Psych- euthymic mood, full affect Neuro- strength and sensation are intact  PPM Interrogation- reviewed in detail today,  See PACEART report  EKG:  EKG is not ordered today.  Recent Labs: 08/14/2019: BUN 18; Creatinine, Ser 0.84; Hemoglobin 11.9; Platelets 213; Potassium 4.0; Sodium 134   Wt Readings from Last 3 Encounters:  10/03/19 124 lb (56.2 kg)  09/05/19 129 lb (58.5 kg)  08/27/19 130 lb (59 kg)     Other studies Reviewed: Additional studies/ records that were reviewed today include: Previous EP office notes, Previous remote checks, Most recent labwork.   Assessment and Plan:  1. Symptomatic bradycardia s/p Medtronic PPM  Normal PPM function See Claudia Desanctis Art report See discussion below for changes in programming  2. Paroxysmal afib / Atrial tachycardia Afib burden is 3% ,but question if this has been accurate.  She is on xarelto for chads2vasc score of 4. Denies bleeding.  She is no longer on amiodarone due to elevated LFTs previously.  She presented today in symptomatic atrial tachycardia with pacing at max track rate. Changing to DDI improved ventricular rates, but ultimately required ANIPs which was successful. Pt in NSR on discharge from clinic.  Left in DDI to see if this helps improves her symptoms prior to following up with Dr. Rayann Heman in October for post gen change 91 day. LRL 60 bpm.  She could potentially be a tikosyn candidate, though not sure how effective this would be for her atrial tach. She has asymptomatic coronary calcification by imaging, so would not necessarily be contraindicated to take flecainide.  EF normal 2017.    3. Hypertensive cardiovascular disease Stable  Current medicines are reviewed at length with the patient today.   The  patient does not have concerns regarding her medicines.  The following changes were made today:  none  Labs/ tests ordered today include:  Orders Placed This Encounter  Procedures  . Basic Metabolic Panel (BMET)  . Magnesium  . TSH   Disposition:   Follow up with Dr. Rayann Heman as scheduled.   Jacalyn Lefevre, PA-C  10/03/2019 2:12 PM  [place] [address] [phone] (office) [phone] (fax)

## 2019-10-03 ENCOUNTER — Ambulatory Visit: Payer: Medicare Other | Admitting: Student

## 2019-10-03 ENCOUNTER — Other Ambulatory Visit: Payer: Self-pay

## 2019-10-03 ENCOUNTER — Encounter: Payer: Self-pay | Admitting: Student

## 2019-10-03 VITALS — BP 110/70 | HR 118 | Ht 60.0 in | Wt 124.0 lb

## 2019-10-03 DIAGNOSIS — R002 Palpitations: Secondary | ICD-10-CM | POA: Diagnosis not present

## 2019-10-03 DIAGNOSIS — I495 Sick sinus syndrome: Secondary | ICD-10-CM

## 2019-10-03 DIAGNOSIS — I480 Paroxysmal atrial fibrillation: Secondary | ICD-10-CM | POA: Diagnosis not present

## 2019-10-03 DIAGNOSIS — Z950 Presence of cardiac pacemaker: Secondary | ICD-10-CM

## 2019-10-03 LAB — CUP PACEART INCLINIC DEVICE CHECK
Battery Remaining Longevity: 161 mo
Battery Voltage: 3.21 V
Brady Statistic AP VP Percent: 5.31 %
Brady Statistic AP VS Percent: 24.93 %
Brady Statistic AS VP Percent: 16.11 %
Brady Statistic AS VS Percent: 53.65 %
Brady Statistic RA Percent Paced: 30.12 %
Brady Statistic RV Percent Paced: 22.96 %
Date Time Interrogation Session: 20210825140220
Implantable Lead Implant Date: 20101109
Implantable Lead Implant Date: 20101109
Implantable Lead Location: 753859
Implantable Lead Location: 753860
Implantable Lead Model: 4469
Implantable Lead Model: 4470
Implantable Lead Serial Number: 535369
Implantable Lead Serial Number: 657967
Implantable Pulse Generator Implant Date: 20210708
Lead Channel Impedance Value: 304 Ohm
Lead Channel Impedance Value: 456 Ohm
Lead Channel Impedance Value: 494 Ohm
Lead Channel Impedance Value: 513 Ohm
Lead Channel Pacing Threshold Amplitude: 0.5 V
Lead Channel Pacing Threshold Amplitude: 1 V
Lead Channel Pacing Threshold Pulse Width: 0.4 ms
Lead Channel Pacing Threshold Pulse Width: 0.4 ms
Lead Channel Sensing Intrinsic Amplitude: 0.375 mV
Lead Channel Sensing Intrinsic Amplitude: 0.75 mV
Lead Channel Sensing Intrinsic Amplitude: 10.375 mV
Lead Channel Sensing Intrinsic Amplitude: 12.25 mV
Lead Channel Setting Pacing Amplitude: 2 V
Lead Channel Setting Pacing Amplitude: 2.5 V
Lead Channel Setting Pacing Pulse Width: 0.4 ms
Lead Channel Setting Sensing Sensitivity: 1.2 mV

## 2019-10-03 LAB — BASIC METABOLIC PANEL
BUN/Creatinine Ratio: 26 (ref 12–28)
BUN: 25 mg/dL (ref 8–27)
CO2: 24 mmol/L (ref 20–29)
Calcium: 9.2 mg/dL (ref 8.7–10.3)
Chloride: 94 mmol/L — ABNORMAL LOW (ref 96–106)
Creatinine, Ser: 0.95 mg/dL (ref 0.57–1.00)
GFR calc Af Amer: 64 mL/min/{1.73_m2} (ref >59)
GFR calc non Af Amer: 55 mL/min/{1.73_m2} — ABNORMAL LOW (ref >59)
Glucose: 86 mg/dL (ref 65–99)
Potassium: 4.5 mmol/L (ref 3.5–5.2)
Sodium: 131 mmol/L — ABNORMAL LOW (ref 134–144)

## 2019-10-03 LAB — TSH: TSH: 7.11 u[IU]/mL — ABNORMAL HIGH (ref 0.450–4.500)

## 2019-10-03 LAB — MAGNESIUM: Magnesium: 2.1 mg/dL (ref 1.6–2.3)

## 2019-10-03 NOTE — Patient Instructions (Addendum)
Medication Instructions:  *If you need a refill on your cardiac medications before your next appointment, please call your pharmacy*  Lab Work: Your physician has recommended that you have lab work today: BMET, Magnesium Level, and TSH  If you have labs (blood work) drawn today and your tests are completely normal, you will receive your results only by: Marland Kitchen MyChart Message (if you have MyChart) OR . A paper copy in the mail If you have any lab test that is abnormal or we need to change your treatment, we will call you to review the results.  Follow-Up: At [facility], you and your health needs are our priority.  As part of our continuing mission to provide you with exceptional heart care, we have created designated Provider Care Teams.  These Care Teams include your primary Cardiologist (physician) and Advanced Practice Providers (APPs -  Physician Assistants and Nurse Practitioners) who all work together to provide you with the care you need, when you need it.  We recommend signing up for the patient portal called "MyChart".  Sign up information is provided on this After Visit Summary.  MyChart is used to connect with patients for Virtual Visits (Telemedicine).  Patients are able to view lab/test results, encounter notes, upcoming appointments, etc.  Non-urgent messages can be sent to your provider as well.   To learn more about what you can do with MyChart, go to [email or web address].    Your next appointment:   Your physician recommends that you keep your follow up as scheduled with Dr. Rayann Heman -- Monday 11/19/19 at 2:45 pm   Remote monitoring is used to monitor your Pacemaker from home. This monitoring reduces the number of office visits required to check your device to one time per year. It allows [place] to keep an eye on the functioning of your device to ensure it is working properly. You are scheduled for a device check from home on 11/16/19. You may send your transmission at any time that  day. If you have a wireless device, the transmission will be sent automatically. After your physician reviews your transmission, you will receive a postcard with your next transmission date.  The format for your next appointment:   In Person with Thompson Grayer, MD

## 2019-10-04 ENCOUNTER — Telehealth: Payer: Self-pay

## 2019-10-04 DIAGNOSIS — R7989 Other specified abnormal findings of blood chemistry: Secondary | ICD-10-CM

## 2019-10-04 NOTE — Telephone Encounter (Signed)
-----   Message from Shirley Friar, PA-C sent at 10/04/2019  8:16 AM EDT ----- Needs repeat TSH, Free T3, and Free T4. [place] for this week, or early next week if more convenient for patient.

## 2019-10-04 NOTE — Telephone Encounter (Signed)
Pt is aware and agreeable to lab results and recommendations.  Pt is agreeable to repeat labs on 10/10/19. Orders are in and appt is made.

## 2019-10-09 ENCOUNTER — Other Ambulatory Visit: Payer: Medicare Other | Admitting: *Deleted

## 2019-10-09 ENCOUNTER — Other Ambulatory Visit: Payer: Self-pay

## 2019-10-09 DIAGNOSIS — R7989 Other specified abnormal findings of blood chemistry: Secondary | ICD-10-CM | POA: Diagnosis not present

## 2019-10-10 ENCOUNTER — Other Ambulatory Visit: Payer: Medicare Other

## 2019-10-10 LAB — TSH: TSH: 9.78 u[IU]/mL — ABNORMAL HIGH (ref 0.450–4.500)

## 2019-10-10 LAB — T4, FREE: Free T4: 1.35 ng/dL (ref 0.82–1.77)

## 2019-10-10 LAB — T3, FREE: T3, Free: 1.9 pg/mL — ABNORMAL LOW (ref 2.0–4.4)

## 2019-10-16 ENCOUNTER — Encounter (HOSPITAL_COMMUNITY): Payer: Self-pay

## 2019-10-16 ENCOUNTER — Ambulatory Visit (HOSPITAL_COMMUNITY): Payer: Medicare Other | Admitting: Nurse Practitioner

## 2019-10-17 DIAGNOSIS — H0102B Squamous blepharitis left eye, upper and lower eyelids: Secondary | ICD-10-CM | POA: Diagnosis not present

## 2019-10-17 DIAGNOSIS — H0102A Squamous blepharitis right eye, upper and lower eyelids: Secondary | ICD-10-CM | POA: Diagnosis not present

## 2019-10-17 DIAGNOSIS — H1045 Other chronic allergic conjunctivitis: Secondary | ICD-10-CM | POA: Diagnosis not present

## 2019-10-17 DIAGNOSIS — H04123 Dry eye syndrome of bilateral lacrimal glands: Secondary | ICD-10-CM | POA: Diagnosis not present

## 2019-11-01 ENCOUNTER — Telehealth (HOSPITAL_COMMUNITY): Payer: Self-pay

## 2019-11-01 NOTE — Telephone Encounter (Signed)
Patient called in and states she has been in A-fib since yesterday. Her heart rate today was 95 and blood pressure -131/78. She is having shortness of breath, tired and has not felt well the past few days. Patient given appointment  for tomorrow at 10:30am with Roderic Palau NP along with the parking code to access the garage for parking. Patient verbalized understanding.

## 2019-11-02 ENCOUNTER — Other Ambulatory Visit: Payer: Self-pay

## 2019-11-02 ENCOUNTER — Encounter (HOSPITAL_COMMUNITY): Payer: Self-pay | Admitting: Nurse Practitioner

## 2019-11-02 ENCOUNTER — Ambulatory Visit (HOSPITAL_COMMUNITY)
Admission: RE | Admit: 2019-11-02 | Discharge: 2019-11-02 | Disposition: A | Discharge status: Home / Self Care | Payer: Medicare Other | Source: Ambulatory Visit | Attending: Nurse Practitioner | Admitting: Nurse Practitioner

## 2019-11-02 VITALS — BP 116/62 | HR 66 | Ht 60.0 in | Wt 132.2 lb

## 2019-11-02 DIAGNOSIS — Z950 Presence of cardiac pacemaker: Secondary | ICD-10-CM | POA: Insufficient documentation

## 2019-11-02 DIAGNOSIS — Z79890 Hormone replacement therapy (postmenopausal): Secondary | ICD-10-CM | POA: Diagnosis not present

## 2019-11-02 DIAGNOSIS — Z87891 Personal history of nicotine dependence: Secondary | ICD-10-CM | POA: Insufficient documentation

## 2019-11-02 DIAGNOSIS — Z7901 Long term (current) use of anticoagulants: Secondary | ICD-10-CM | POA: Insufficient documentation

## 2019-11-02 DIAGNOSIS — R0602 Shortness of breath: Secondary | ICD-10-CM | POA: Diagnosis not present

## 2019-11-02 DIAGNOSIS — E039 Hypothyroidism, unspecified: Secondary | ICD-10-CM | POA: Insufficient documentation

## 2019-11-02 DIAGNOSIS — R748 Abnormal levels of other serum enzymes: Secondary | ICD-10-CM | POA: Diagnosis not present

## 2019-11-02 DIAGNOSIS — Z79899 Other long term (current) drug therapy: Secondary | ICD-10-CM | POA: Insufficient documentation

## 2019-11-02 DIAGNOSIS — D6869 Other thrombophilia: Secondary | ICD-10-CM | POA: Diagnosis not present

## 2019-11-02 DIAGNOSIS — I480 Paroxysmal atrial fibrillation: Secondary | ICD-10-CM | POA: Insufficient documentation

## 2019-11-02 DIAGNOSIS — I959 Hypotension, unspecified: Secondary | ICD-10-CM | POA: Insufficient documentation

## 2019-11-02 DIAGNOSIS — Z8249 Family history of ischemic heart disease and other diseases of the circulatory system: Secondary | ICD-10-CM | POA: Diagnosis not present

## 2019-11-02 MED ORDER — DILTIAZEM HCL 30 MG PO TABS
ORAL_TABLET | ORAL | 1 refills | Status: DC
Start: 2019-11-02 — End: 2021-06-18

## 2019-11-02 NOTE — Patient Instructions (Signed)
Cardizem 30mg  -- take 1/2 to 1 tablet every 4 hours AS NEEDED for heart rate >100 as long as top number of blood pressure >100.

## 2019-11-02 NOTE — Progress Notes (Signed)
Primary Care Physician: Prince Solian, MD Referring Physician: self referred EP: Dr. Ezzard Flax is a 86 y.o. female with a h/o afib previously on amiodarone, stopped previously for elevated liver enzymes. She had a recent gen change out. At that time she was having some soft BP's and BB was stopped. After a few days she was calling the afib office with c/o that she felt her HR was running too fast. He was started back on low dose BB and called the office again on Friday, again with HR's in the 90's, 'heart beating too fast."  We resumed her piror BB dose. Hr had one dip in her BP, drank some water and BP improved. Asked to be seen this am as she felt her heart rate was irregular. Now in the office, she is a paced. She does not  feel the irregular HB now. She is due Thursday for wound check in the device clinic and can ask for her device to be interrogated to further understand heart irregularities.   Mrs Banes wanted to be seen today,11/02/19, for having 2 episodes of irregular rhythm 2 days in a row as well as she doesn't understand why she is short of breath with waking but can do water aerobic's for an hour with  weights and pool pull up's and does not have  any shortness of breath with these activities. She  is also concerned that her weight use to run 124-128 lbs and now it runs 128 to 132 lbs. She  did have her thyroid med adjusted the end of August for an elevated TSH. She is in a junctional rhythm today.   Today, she denies symptoms of palpitations, chest pain, shortness of breath, orthopnea, PND, lower extremity edema, dizziness, presyncope, syncope, or neurologic sequela. The patient is tolerating medications without difficulties and is otherwise without complaint today.   Past Medical History:  Diagnosis Date  . Arthritis   . Atrial fibrillation ([place])   . Bradycardia    s/p PPM  . Cataract   . Colonic polyp    adenomatous  . Constipation   . Diverticulosis   .  Hemorrhoids   . Hypertension   . Hypothyroidism   . Irritable bowel syndrome (IBS)   . Pacemaker 2010   Past Surgical History:  Procedure Laterality Date  . BREAST CYST ASPIRATION Left 1980s  . COLONOSCOPY    . ELECTROPHYSIOLOGIC STUDY N/A 07/01/2015   Procedure: Atrial Fibrillation Ablation;  Surgeon: Thompson Grayer, MD;  Location: [facility];  Service: Cardiovascular;  Laterality: N/A;  . EYE SURGERY    . HEMATOMA EVACUATION Right 07/15/2014   Procedure: EVACUATION Right Groin Hematoma;  Surgeon: Rosetta Posner, MD;  Location: [place];  Service: Vascular;  Laterality: Right;  . NASAL ENDOSCOPY WITH EPISTAXIS CONTROL N/A 07/14/2014   Procedure: NASAL ENDOSCOPY WITH EPISTAXIS CONTROL;  Surgeon: Ruby Cola, MD;  Location: [place];  Service: ENT;  Laterality: N/A;  . NASAL ENDOSCOPY WITH EPISTAXIS CONTROL Bilateral 07/15/2014   Procedure: NASAL ENDOSCOPY WITH EPISTAXIS CONTROL;  Surgeon: Ruby Cola, MD;  Location: [place];  Service: ENT;  Laterality: Bilateral;  . PACEMAKER INSERTION  2010   implanted by Dr Doreatha Lew (MDT)  . PPM GENERATOR CHANGEOUT N/A 08/16/2019   Procedure: PPM GENERATOR CHANGEOUT;  Surgeon: Thompson Grayer, MD;  Location: [place] [facility];  Service: Cardiovascular;  Laterality: N/A;  . RADIOLOGY WITH ANESTHESIA N/A 07/14/2014   Procedure: RADIOLOGY WITH ANESTHESIA;  Surgeon: Medication  Radiologist, MD;  Location: [facility];  Service: Radiology;  Laterality: N/A;  . TEE WITHOUT CARDIOVERSION N/A 07/01/2015   Procedure: TRANSESOPHAGEAL ECHOCARDIOGRAM (TEE);  Surgeon: Sueanne Margarita, MD;  Location: [facility] ENDOSCOPY;  Service: Cardiovascular;  Laterality: N/A;    Current Outpatient Medications  Medication Sig Dispense Refill  . acetaminophen (TYLENOL) 500 MG tablet Take 500-1,000 mg by mouth every 6 (six) hours as needed for moderate pain or headache.    . Artificial Tear Ointment (DRY EYES OP) Place 1 drop into both eyes daily as needed (Dry eye).    . Ascorbic Acid (VITAMIN C) 1000 MG  tablet Take 1,000 mg by mouth daily.      . Calcium-Vitamin D (CALTRATE 600 PLUS-VIT D PO) Take 1 tablet by mouth daily.    . cholecalciferol (VITAMIN D) 1000 UNITS tablet Take 1,000 Units by mouth daily.      . Coenzyme Q10 (CO Q-10 PO) Take 1 tablet by mouth daily.    . COLLAGEN PO Take 2 Scoops by mouth daily.    . cromolyn (OPTICROM) 4 % ophthalmic solution 1 drop 4 (four) times daily.    Marland Kitchen diltiazem (CARDIZEM CD) 360 MG 24 hr capsule Take 1 capsule (360 mg total) by mouth daily. 90 capsule 3  . levothyroxine (SYNTHROID) 75 MCG tablet Take 75 mcg by mouth daily before breakfast. Alternate with 50 mcg    . Lysine 1000 MG TABS Take 1,000 mg by mouth daily.     . methylcellulose (CITRUCEL) oral powder Take 2 packets by mouth daily.    . metoprolol succinate (TOPROL XL) 50 MG 24 hr tablet Take 75mg  TWICE A DAY 270 tablet 3  . Multiple Vitamins-Minerals (ICAPS AREDS 2 PO) Take 1 capsule by mouth daily.     . Omega-3 Fatty Acids (FISH OIL) 1200 MG CAPS Take 1,200 mg by mouth daily. Mega Red.     . polyethylene glycol powder (GLYCOLAX/MIRALAX) powder Take 0.5 Containers by mouth at bedtime.   0  . pravastatin (PRAVACHOL) 10 MG tablet Take 1 tablet (10 mg total) by mouth daily. 90 tablet 3  . Probiotic Product (ALIGN PO) Take 1 capsule by mouth daily.     . Rivaroxaban (XARELTO) 15 MG TABS tablet Take 1 tablet (15 mg total) by mouth daily with supper. 90 tablet 1  . sodium chloride (OCEAN) 0.65 % SOLN nasal spray Place 1 spray into both nostrils daily.    Marland Kitchen SYNTHROID 50 MCG tablet Take 50 mcg by mouth daily. Alternate with 36mcg    . temazepam (RESTORIL) 30 MG capsule Take 30 mg by mouth at bedtime.     . Zinc 50 MG CAPS Take 50 mg by mouth daily.     Marland Kitchen diltiazem (CARDIZEM) 30 MG tablet Take 1/2 to 1 tablet every 4 hours AS NEEDED for heart rate >100 as long as top blood pressure >100. 45 tablet 1   No current facility-administered medications for this encounter.    Allergies  Allergen  Reactions  . Trazodone And Nefazodone     "twitching" and trembling    Social History   Socioeconomic History  . Marital status: Widowed    Spouse name: Not on file  . Number of children: 3  . Years of education: Not on file  . Highest education level: Not on file  Occupational History  . Occupation: retired  Tobacco Use  . Smoking status: Former Smoker    Types: Cigarettes    Quit date: 02/09/1980  Years since quitting: 39.7  . Smokeless tobacco: Never Used  Vaping Use  . Vaping Use: Never used  Substance and Sexual Activity  . Alcohol use: Yes    Alcohol/week: 14.0 standard drinks    Types: 14 Glasses of wine per week    Comment: wine 6 days a week  . Drug use: No  . Sexual activity: Not on file  Other Topics Concern  . Not on file  Social History Narrative  . Not on file   Social Determinants of Health   Financial Resource Strain:   . Difficulty of Paying Living Expenses: Not on file  Food Insecurity:   . Worried About Charity fundraiser in the Last Year: Not on file  . Ran Out of Food in the Last Year: Not on file  Transportation Needs:   . Lack of Transportation (Medical): Not on file  . Lack of Transportation (Non-Medical): Not on file  Physical Activity:   . Days of Exercise per Week: Not on file  . Minutes of Exercise per Session: Not on file  Stress:   . Feeling of Stress : Not on file  Social Connections:   . Frequency of Communication with Friends and Family: Not on file  . Frequency of Social Gatherings with Friends and Family: Not on file  . Attends Religious Services: Not on file  . Active Member of Clubs or Organizations: Not on file  . Attends Archivist Meetings: Not on file  . Marital Status: Not on file  Intimate Partner Violence:   . Fear of Current or Ex-Partner: Not on file  . Emotionally Abused: Not on file  . Physically Abused: Not on file  . Sexually Abused: Not on file    Family History  Problem Relation Age of  Onset  . Hypertension Mother   . Congestive Heart Failure Mother   . Dementia Mother   . Heart attack Father 55       MI cause of death  . Diabetes Paternal Grandfather   . CAD Brother        2 stents  . Lung cancer Son   . Leukemia Daughter   . Colon cancer Neg Hx   . Kidney disease Neg Hx   . Liver disease Neg Hx   . Esophageal cancer Neg Hx   . Stroke Neg Hx     ROS- All systems are reviewed and negative except as per the HPI above  Physical Exam: Vitals:   11/02/19 1033  BP: 116/62  Pulse: 66  Weight: 60 kg  Height: 5' (1.524 m)   Wt Readings from Last 3 Encounters:  11/02/19 60 kg  10/03/19 56.2 kg  09/05/19 58.5 kg    Labs: Lab Results  Component Value Date   NA 131 (L) 10/03/2019   K 4.5 10/03/2019   CL 94 (L) 10/03/2019   CO2 24 10/03/2019   GLUCOSE 86 10/03/2019   BUN 25 10/03/2019   CREATININE 0.95 10/03/2019   CALCIUM 9.2 10/03/2019   MG 2.1 10/03/2019   Lab Results  Component Value Date   INR 2.3 06/24/2015   Lab Results  Component Value Date   CHOL 167 10/08/2015   HDL 94 10/08/2015   LDLCALC 62 10/08/2015   TRIG 56 10/08/2015     GEN- The patient is well appearing, alert and oriented x 3 today.   Head- normocephalic, atraumatic Eyes-  Sclera clear, conjunctiva pink Ears- hearing intact Oropharynx- clear Neck- supple, no JVP  Lymph- no cervical lymphadenopathy Lungs- Clear to ausculation bilaterally, normal work of breathing Heart- Regular rate and rhythm, no murmurs, rubs or gallops, PMI not laterally displaced GI- soft, NT, ND, + BS Extremities- no clubbing, cyanosis, or edema MS- no significant deformity or atrophy Skin- no rash or lesion Psych- euthymic mood, full affect Neuro- strength and sensation are intact  EKG- accelerated junctional rhythm at 66 bpm.     Assessment and Plan: 1. Afib/flutter/atrail tach S/p PPM, recent gen change out  Continue rate control with metoprolol 75 mg bid and Cardizem 360 mg bid  I  will Rx Cardizem 30 mg 1/2 to one tab as needed for spells of irregular  HR  She is due for a remote check 10/8 and f/u with Dr. Rayann Heman 10/11 Since she  Failed amiodarone, if afib increases in burden, dicussed amy  be a Tikosyn candidate, she does not want to do this   2. CHA2DS2VASc score of at least 5  Continue  xarelto 15 mg daily   3. Hypotension Stable today   4. Shortness of breath with walking Echo ordered   F/u with device clinic /Dr. Allred as scheduled I will call results of echo  Geroge Baseman. Kimala Horne, [facility] [address] [place], [place] [address] [phone]

## 2019-11-05 DIAGNOSIS — M5116 Intervertebral disc disorders with radiculopathy, lumbar region: Secondary | ICD-10-CM | POA: Diagnosis not present

## 2019-11-05 DIAGNOSIS — M9903 Segmental and somatic dysfunction of lumbar region: Secondary | ICD-10-CM | POA: Diagnosis not present

## 2019-11-06 ENCOUNTER — Ambulatory Visit (HOSPITAL_COMMUNITY)
Admission: RE | Admit: 2019-11-06 | Discharge: 2019-11-06 | Disposition: A | Discharge status: Home / Self Care | Payer: Medicare Other | Source: Ambulatory Visit | Attending: Nurse Practitioner | Admitting: Nurse Practitioner

## 2019-11-06 ENCOUNTER — Other Ambulatory Visit: Payer: Self-pay

## 2019-11-06 DIAGNOSIS — I10 Essential (primary) hypertension: Secondary | ICD-10-CM | POA: Insufficient documentation

## 2019-11-06 DIAGNOSIS — I495 Sick sinus syndrome: Secondary | ICD-10-CM | POA: Insufficient documentation

## 2019-11-06 DIAGNOSIS — I083 Combined rheumatic disorders of mitral, aortic and tricuspid valves: Secondary | ICD-10-CM | POA: Insufficient documentation

## 2019-11-06 DIAGNOSIS — I4891 Unspecified atrial fibrillation: Secondary | ICD-10-CM | POA: Diagnosis not present

## 2019-11-06 DIAGNOSIS — R0600 Dyspnea, unspecified: Secondary | ICD-10-CM | POA: Diagnosis not present

## 2019-11-06 DIAGNOSIS — I480 Paroxysmal atrial fibrillation: Secondary | ICD-10-CM

## 2019-11-06 LAB — ECHOCARDIOGRAM COMPLETE
AR max vel: 2.01 cm2
AV Area VTI: 1.89 cm2
AV Area mean vel: 1.97 cm2
AV Mean grad: 4 mmHg
AV Peak grad: 6.7 mmHg
Ao pk vel: 1.29 m/s
Area-P 1/2: 3.48 cm2
Calc EF: 55.7 %
MV M vel: 4.98 m/s
MV Peak grad: 99.2 mmHg
P 1/2 time: 406 msec
Radius: 0.4 cm
S' Lateral: 2.3 cm
Single Plane A2C EF: 55.3 %
Single Plane A4C EF: 60 %

## 2019-11-06 NOTE — Progress Notes (Signed)
  Echocardiogram 2D Echocardiogram has been performed.  Bobbye Charleston 11/06/2019, 4:10 PM

## 2019-11-08 DIAGNOSIS — M9903 Segmental and somatic dysfunction of lumbar region: Secondary | ICD-10-CM | POA: Diagnosis not present

## 2019-11-08 DIAGNOSIS — M5116 Intervertebral disc disorders with radiculopathy, lumbar region: Secondary | ICD-10-CM | POA: Diagnosis not present

## 2019-11-13 ENCOUNTER — Other Ambulatory Visit: Payer: Self-pay | Admitting: Internal Medicine

## 2019-11-13 DIAGNOSIS — Z1231 Encounter for screening mammogram for malignant neoplasm of breast: Secondary | ICD-10-CM

## 2019-11-15 DIAGNOSIS — M5116 Intervertebral disc disorders with radiculopathy, lumbar region: Secondary | ICD-10-CM | POA: Diagnosis not present

## 2019-11-15 DIAGNOSIS — M9903 Segmental and somatic dysfunction of lumbar region: Secondary | ICD-10-CM | POA: Diagnosis not present

## 2019-11-16 ENCOUNTER — Ambulatory Visit (INDEPENDENT_AMBULATORY_CARE_PROVIDER_SITE_OTHER): Payer: Medicare Other

## 2019-11-16 DIAGNOSIS — I495 Sick sinus syndrome: Secondary | ICD-10-CM

## 2019-11-16 LAB — CUP PACEART REMOTE DEVICE CHECK
Battery Remaining Longevity: 165 mo
Battery Voltage: 3.21 V
Brady Statistic AP VP Percent: 0.08 %
Brady Statistic AP VS Percent: 0.01 %
Brady Statistic AS VP Percent: 0 %
Brady Statistic AS VS Percent: 99.91 %
Brady Statistic RA Percent Paced: 0.09 %
Brady Statistic RV Percent Paced: 0.08 %
Date Time Interrogation Session: 20211008075742
Implantable Lead Implant Date: 20101109
Implantable Lead Implant Date: 20101109
Implantable Lead Location: 753859
Implantable Lead Location: 753860
Implantable Lead Model: 4469
Implantable Lead Model: 4470
Implantable Lead Serial Number: 535369
Implantable Lead Serial Number: 657967
Implantable Pulse Generator Implant Date: 20210708
Lead Channel Impedance Value: 266 Ohm
Lead Channel Impedance Value: 418 Ohm
Lead Channel Impedance Value: 456 Ohm
Lead Channel Impedance Value: 475 Ohm
Lead Channel Pacing Threshold Amplitude: 0.5 V
Lead Channel Pacing Threshold Amplitude: 1 V
Lead Channel Pacing Threshold Pulse Width: 0.4 ms
Lead Channel Pacing Threshold Pulse Width: 0.4 ms
Lead Channel Sensing Intrinsic Amplitude: 0.5 mV
Lead Channel Sensing Intrinsic Amplitude: 0.5 mV
Lead Channel Sensing Intrinsic Amplitude: 11.5 mV
Lead Channel Sensing Intrinsic Amplitude: 11.5 mV
Lead Channel Setting Pacing Amplitude: 2 V
Lead Channel Setting Pacing Amplitude: 2.5 V
Lead Channel Setting Pacing Pulse Width: 0.4 ms
Lead Channel Setting Sensing Sensitivity: 1.2 mV

## 2019-11-18 ENCOUNTER — Encounter: Payer: Self-pay | Admitting: Podiatry

## 2019-11-19 ENCOUNTER — Ambulatory Visit: Payer: Medicare Other | Admitting: Internal Medicine

## 2019-11-19 ENCOUNTER — Other Ambulatory Visit: Payer: Self-pay

## 2019-11-19 ENCOUNTER — Encounter: Payer: Self-pay | Admitting: Internal Medicine

## 2019-11-19 ENCOUNTER — Ambulatory Visit: Payer: Medicare Other | Admitting: Podiatry

## 2019-11-19 VITALS — BP 112/72 | HR 67 | Ht 60.0 in | Wt 130.6 lb

## 2019-11-19 DIAGNOSIS — I10 Essential (primary) hypertension: Secondary | ICD-10-CM

## 2019-11-19 DIAGNOSIS — M7662 Achilles tendinitis, left leg: Secondary | ICD-10-CM | POA: Diagnosis not present

## 2019-11-19 DIAGNOSIS — G5782 Other specified mononeuropathies of left lower limb: Secondary | ICD-10-CM | POA: Diagnosis not present

## 2019-11-19 DIAGNOSIS — I480 Paroxysmal atrial fibrillation: Secondary | ICD-10-CM | POA: Diagnosis not present

## 2019-11-19 DIAGNOSIS — M7672 Peroneal tendinitis, left leg: Secondary | ICD-10-CM

## 2019-11-19 DIAGNOSIS — I495 Sick sinus syndrome: Secondary | ICD-10-CM | POA: Diagnosis not present

## 2019-11-19 NOTE — Progress Notes (Signed)
   HPI: 86 y.o. female presenting today for evaluation of increased left foot pain this been going on for the last week or 2.  Patient states that she did receive an injection on last visit 09/18/2019 which did not help to alleviate any symptoms.  She is currently not taking any medication.  Past Medical History:  Diagnosis Date  . Arthritis   . Atrial fibrillation ([place])   . Bradycardia    s/p PPM  . Cataract   . Colonic polyp    adenomatous  . Constipation   . Diverticulosis   . Hemorrhoids   . Hypertension   . Hypothyroidism   . Irritable bowel syndrome (IBS)   . Pacemaker 2010      Physical Exam: General: The patient is alert and oriented x3 in no acute distress.  Dermatology: Skin is warm, dry and supple bilateral lower extremities. Negative for open lesions or macerations.  Vascular: Palpable pedal pulses bilaterally. No edema or erythema noted. Capillary refill within normal limits.  Neurological: Epicritic and protective threshold grossly intact bilaterally.   Musculoskeletal Exam: Pain on palpation noted to the posterior tubercle of the left calcaneus at the insertion of the Achilles tendon and as the Achilles tendon extends proximal. Range of motion within normal limits. Muscle strength 5/5 in all muscle groups bilateral lower extremities.   Assessment: 1. Insertional Achilles tendinitis left 2.  Sural nerve neuritis left 3.  Peroneal tendinitis left   Plan of Care:  1. Patient was evaluated.  2.  Patient declined injections today.  She states that they did not help last visit 3.  Patient declined any oral medication today 4.  Compression anklet dispensed today.  Wear daily 5.  Continue wearing good supportive sneakers 6.  Return to clinic as needed   Edrick Kins, DPM [facility]  Dr. Edrick Kins, DPM    [address], [place] [address]                Office [phone]  Fax [phone]

## 2019-11-19 NOTE — Progress Notes (Signed)
PCP: Prince Solian, MD Primary Cardiologist: Dr Oval Linsey Primary EP:  Dr Rayann Heman  Kathryn Gibson is a 86 y.o. female who presents today for routine electrophysiology followup.  Since her generator change, the patient reports doing reasonably well.  She has had episodes of hypotension which have affected medical therapy.  She has also had atrial tachycardia for which she had pace termination by Oda Kilts 10/03/19. She was reprogrammed DDI at that time. She reports doing "better" at rest, but has reduced exercise tolerance.  She has severe LA enlargement.  Today, she denies symptoms of palpitations, chest pain,   dizziness, presyncope, or syncope.  The patient is otherwise without complaint today.   Past Medical History:  Diagnosis Date  . Arthritis   . Atrial fibrillation ([place])   . Bradycardia    s/p PPM  . Cataract   . Colonic polyp    adenomatous  . Constipation   . Diverticulosis   . Hemorrhoids   . Hypertension   . Hypothyroidism   . Irritable bowel syndrome (IBS)   . Pacemaker 2010   Past Surgical History:  Procedure Laterality Date  . BREAST CYST ASPIRATION Left 1980s  . COLONOSCOPY    . ELECTROPHYSIOLOGIC STUDY N/A 07/01/2015   Procedure: Atrial Fibrillation Ablation;  Surgeon: Thompson Grayer, MD;  Location: [place] [facility];  Service: Cardiovascular;  Laterality: N/A;  . EYE SURGERY    . HEMATOMA EVACUATION Right 07/15/2014   Procedure: EVACUATION Right Groin Hematoma;  Surgeon: Rosetta Posner, MD;  Location: [place];  Service: Vascular;  Laterality: Right;  . NASAL ENDOSCOPY WITH EPISTAXIS CONTROL N/A 07/14/2014   Procedure: NASAL ENDOSCOPY WITH EPISTAXIS CONTROL;  Surgeon: Ruby Cola, MD;  Location: [place];  Service: ENT;  Laterality: N/A;  . NASAL ENDOSCOPY WITH EPISTAXIS CONTROL Bilateral 07/15/2014   Procedure: NASAL ENDOSCOPY WITH EPISTAXIS CONTROL;  Surgeon: Ruby Cola, MD;  Location: [place];  Service: ENT;  Laterality: Bilateral;  . PACEMAKER INSERTION  2010    implanted by Dr Doreatha Lew (MDT)  . PPM GENERATOR CHANGEOUT N/A 08/16/2019   Procedure: PPM GENERATOR CHANGEOUT;  Surgeon: Thompson Grayer, MD;  Location: [place] [facility];  Service: Cardiovascular;  Laterality: N/A;  . RADIOLOGY WITH ANESTHESIA N/A 07/14/2014   Procedure: RADIOLOGY WITH ANESTHESIA;  Surgeon: Medication Radiologist, MD;  Location: [place];  Service: Radiology;  Laterality: N/A;  . TEE WITHOUT CARDIOVERSION N/A 07/01/2015   Procedure: TRANSESOPHAGEAL ECHOCARDIOGRAM (TEE);  Surgeon: Sueanne Margarita, MD;  Location: [facility] END[facility]COPY;  Service: Cardiovascular;  Laterality: N/A;    ROS- all systems are reviewed and negative except as per HPI above  Current Outpatient Medications  Medication Sig Dispense Refill  . acetaminophen (TYLENOL) 500 MG tablet Take 500-1,000 mg by mouth every 6 (six) hours as needed for moderate pain or headache.    . Artificial Tear Ointment (DRY EYES OP) Place 1 drop into both eyes daily as needed (Dry eye).    . Ascorbic Acid (VITAMIN C) 1000 MG tablet Take 1,000 mg by mouth daily.      . Calcium-Vitamin D (CALTRATE 600 PLUS-VIT D PO) Take 1 tablet by mouth daily.    . cholecalciferol (VITAMIN D) 1000 UNITS tablet Take 1,000 Units by mouth daily.      . Coenzyme Q10 (CO Q-10 PO) Take 1 tablet by mouth daily.    . COLLAGEN PO Take 2 Scoops by mouth daily.    . cromolyn (OPTICROM) 4 % ophthalmic solution 1 drop 4 (four)  times daily.    Marland Kitchen diltiazem (CARDIZEM CD) 360 MG 24 hr capsule Take 1 capsule (360 mg total) by mouth daily. 90 capsule 3  . diltiazem (CARDIZEM) 30 MG tablet Take 1/2 to 1 tablet every 4 hours AS NEEDED for heart rate >100 as long as top blood pressure >100. 45 tablet 1  . levothyroxine (SYNTHROID) 50 MCG tablet Take 50 mcg by mouth once a week.    . levothyroxine (SYNTHROID) 75 MCG tablet Take 75 mcg by mouth daily before breakfast. 6 days a week    . Lysine 1000 MG TABS Take 1,000 mg by mouth daily.     . methylcellulose (CITRUCEL) oral powder Take 2  packets by mouth daily.    . metoprolol succinate (TOPROL XL) 50 MG 24 hr tablet Take 75mg  TWICE A DAY 270 tablet 3  . Multiple Vitamins-Minerals (ICAPS AREDS 2 PO) Take 1 capsule by mouth daily.     . Omega-3 Fatty Acids (FISH OIL) 1200 MG CAPS Take 1,200 mg by mouth daily. Mega Red.     . polyethylene glycol powder (GLYCOLAX/MIRALAX) powder Take 0.5 Containers by mouth at bedtime.   0  . pravastatin (PRAVACHOL) 10 MG tablet Take 1 tablet (10 mg total) by mouth daily. 90 tablet 3  . Probiotic Product (ALIGN PO) Take 1 capsule by mouth daily.     . Rivaroxaban (XARELTO) 15 MG TABS tablet Take 1 tablet (15 mg total) by mouth daily with supper. 90 tablet 1  . sodium chloride (OCEAN) 0.65 % SOLN nasal spray Place 1 spray into both nostrils daily.    . temazepam (RESTORIL) 30 MG capsule Take 30 mg by mouth at bedtime.     . Zinc 50 MG CAPS Take 50 mg by mouth daily.      No current facility-administered medications for this visit.    Physical Exam: Vitals:   11/19/19 1510  BP: 112/72  Pulse: 67  SpO2: 92%  Weight: 130 lb 9.6 oz (59.2 kg)  Height: 5' (1.524 m)    GEN- The patient is elderly appearing, alert and oriented x 3 today.  Head- normocephalic, atraumatic Eyes-  Sclera clear, conjunctiva pink Ears- hearing intact Oropharynx- clear Lungs-  normal work of breathing Chest- pacemaker pocket is well healed Heart- Regular rate and rhythm  GI- soft  Extremities- no clubbing, cyanosis, or edema  Pacemaker interrogation- reviewed in detail today,  See PACEART report  Echo 11/06/19- EF 55%, severe LA enlargement, mild MR, mild to moderate TR, moderate AI    Assessment and Plan:  1. Symptomatic sinus bradycardia  Normal pacemaker function See Pace Art report Recently programmed DDI by EP PA.  She is atrial pacing less and her histogram is left shifted.  I will therefore turn rate response back on today. she is not device dependant today Ultimately, we may need to return to  AAIR-->DDDR pacing depending on how she does.  2. Paroxysmal atrial fibrillation afib burden is <0.1 % s/p prior afib ablation. chads2vasc score is 5.  She is on xarelto 15mg  daily Amiodarone was discontinued due to abnormal LFTs Could consider tikosyn if her arrhythmia burden increases. She has severe LA enlargement and will almost certainly have progression of her afib with time.  3. HTN Stable No change required today  Risks, benefits and potential toxicities for medications prescribed and/or refilled reviewed with patient today.   Return to see EP NP in 3 months to assess rate response Return to see me in 6 months  Thompson Grayer MD, [facility] 11/19/2019 3:12 PM

## 2019-11-19 NOTE — Patient Instructions (Addendum)
Medication Instructions:  Your physician recommends that you continue on your current medications as directed. Please refer to the Current Medication list given to you today.  *If you need a refill on your cardiac medications before your next appointment, please call your pharmacy*  Lab Work: None ordered.  If you have labs (blood work) drawn today and your tests are completely normal, you will receive your results only by: Marland Kitchen MyChart Message (if you have MyChart) OR . A paper copy in the mail If you have any lab test that is abnormal or we need to change your treatment, we will call you to review the results.  Testing/Procedures: None ordered.  Follow-Up: At [facility], you and your health needs are our priority.  As part of our continuing mission to provide you with exceptional heart care, we have created designated Provider Care Teams.  These Care Teams include your primary Cardiologist (physician) and Advanced Practice Providers (APPs -  Physician Assistants and Nurse Practitioners) who all work together to provide you with the care you need, when you need it.  We recommend signing up for the patient portal called "MyChart".  Sign up information is provided on this After Visit Summary.  MyChart is used to connect with patients for Virtual Visits (Telemedicine).  Patients are able to view lab/test results, encounter notes, upcoming appointments, etc.  Non-urgent messages can be sent to your provider as well.   To learn more about what you can do with MyChart, go to [email or web address].    Your next appointment:  02/29/20 11:45 am with Chanetta Marshall. Your physician wants you to follow-up in: 6 months with Dr. Rayann Heman. You will receive a reminder letter in the mail two months in advance. If you don't receive a letter, please call our office to schedule the follow-up appointment.  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 This monitoring reduces the number of office  visits required to check your device to one time per year. It allows [place] to keep an eye on the functioning of your device to ensure it is working properly. You are scheduled for a device check from home on 02/15/20 . You may send your transmission at any time that day. If you have a wireless device, the transmission will be sent automatically. After your physician reviews your transmission, you will receive a postcard with your next transmission date.  Other Instructions:

## 2019-11-19 NOTE — Patient Instructions (Signed)
Achilles Tendinitis Rehab Ask your health care provider which exercises are safe for you. Do exercises exactly as told by your health care provider and adjust them as directed. It is normal to feel mild stretching, pulling, tightness, or discomfort as you do these exercises. Stop right away if you feel sudden pain or your pain gets worse. Do not begin these exercises until told by your health care provider. Stretching and range-of-motion exercises These exercises warm up your muscles and joints and improve the movement and flexibility of your ankle. These exercises also help to relieve pain. Standing wall calf stretch with straight knee  1. Stand with your hands against a wall. 2. Extend your left / right leg behind you, and bend your front knee slightly. Keep both of your heels on the floor. 3. Point the toes of your back foot slightly inward. 4. Keeping your heels on the floor and your back knee straight, shift your weight toward the wall. Do not allow your back to arch. You should feel a gentle stretch in your upper calf. 5. Hold this position for __________ seconds. Repeat __________ times. Complete this exercise __________ times a day. Standing wall calf stretch with bent knee 1. Stand with your hands against a wall. 2. Extend your left / right leg behind you, and bend your front knee slightly. Keep both of your heels on the floor. 3. Point the toes of your back foot slightly inward. 4. Keeping your heels on the floor, bend your back knee slightly. You should feel a gentle stretch deep in your lower calf near your heel. 5. Hold this position for __________ seconds. Repeat __________ times. Complete this exercise __________ times a day. Strengthening exercises These exercises build strength and control of your ankle. Endurance is the ability to use your muscles for a long time, even after they get tired. Plantar flexion with band In this exercise, you push your toes downward, away from you,  with an exercise band providing resistance. 1. Sit on the floor with your left / right leg extended. You may put a pillow under your calf to give your foot more room to move. 2. Loop a rubber exercise band or tube around the ball of your left / right foot. The ball of your foot is on the walking surface, right under your toes. The band or tube should be slightly tense when your foot is relaxed. If the band or tube slips, you can put on your shoe or put a washcloth between the band and your foot to help it stay in place. 3. Slowly point your toes downward, pushing them away from you (plantar flexion). 4. Hold this position for __________ seconds. 5. Slowly release the tension in the band or tube, controlling smoothly until your foot is back to the starting position. 6. Repeat steps 1-5 with your left / right leg. Repeat __________ times. Complete this exercise __________ times a day. Eccentric heel drop  In this exercise, you stand and slowly raise your heel and then slowly lower it. This exercise lengthens the calf muscles (eccentric) while the heel bears weight. If this exercise is too easy, try doing it while wearing a backpack with weights in it. 1. Stand on a step with the balls of your feet. The ball of your foot is on the walking surface, right under your toes. ? Do not put your heels on the step. ? For balance, rest your hands on the wall or on a railing. 2. Rise up onto   the balls of your feet. 3. Keeping your heels up, shift all of your weight to your left / right leg and pick up your other leg. 4. Slowly lower your left / right leg so your heel drops below the level of the step. 5. Put down your other foot before returning to the start position. If told by your health care provider, build up to: ? 3 sets of 15 repetitions while keeping your knees straight. ? 3 sets of 15 repetitions while keeping your knees slightly bent as far as told by your health care provider. Repeat __________  times. Complete this exercise __________ times a day. Balance exercises These exercises improve or maintain your balance. Balance is important in preventing falls. Single leg stand If this exercise is too easy, you can try it with your eyes closed or while standing on a pillow. 1. Without shoes, stand near a railing or in a door frame. Hold on to the railing or door frame as needed. 2. Stand on your left / right foot. Keep your big toe down on the floor and try to keep your arch lifted. 3. Hold this position for __________ seconds. Repeat __________ times. Complete this exercise __________ times a day. This information is not intended to replace advice given to you by your health care provider. Make sure you discuss any questions you have with your health care provider. Document Revised: 05/15/2018 Document Reviewed: 11/07/2017 Elsevier Patient Education  2020 [employer]   

## 2019-11-20 DIAGNOSIS — M9903 Segmental and somatic dysfunction of lumbar region: Secondary | ICD-10-CM | POA: Diagnosis not present

## 2019-11-20 DIAGNOSIS — M5116 Intervertebral disc disorders with radiculopathy, lumbar region: Secondary | ICD-10-CM | POA: Diagnosis not present

## 2019-11-20 LAB — CUP PACEART INCLINIC DEVICE CHECK
Battery Remaining Longevity: 166 mo
Battery Voltage: 3.21 V
Brady Statistic AP VP Percent: 11.9 %
Brady Statistic AP VS Percent: 0.12 %
Brady Statistic AS VP Percent: 0.67 %
Brady Statistic AS VS Percent: 87.31 %
Brady Statistic RA Percent Paced: 12.16 %
Brady Statistic RV Percent Paced: 12.68 %
Date Time Interrogation Session: 20211011152100
Implantable Lead Implant Date: 20101109
Implantable Lead Implant Date: 20101109
Implantable Lead Location: 753859
Implantable Lead Location: 753860
Implantable Lead Model: 4469
Implantable Lead Model: 4470
Implantable Lead Serial Number: 535369
Implantable Lead Serial Number: 657967
Implantable Pulse Generator Implant Date: 20210708
Lead Channel Impedance Value: 285 Ohm
Lead Channel Impedance Value: 437 Ohm
Lead Channel Impedance Value: 475 Ohm
Lead Channel Impedance Value: 494 Ohm
Lead Channel Pacing Threshold Amplitude: 0.75 V
Lead Channel Pacing Threshold Amplitude: 1 V
Lead Channel Pacing Threshold Pulse Width: 0.4 ms
Lead Channel Pacing Threshold Pulse Width: 0.4 ms
Lead Channel Sensing Intrinsic Amplitude: 0.375 mV
Lead Channel Sensing Intrinsic Amplitude: 11.625 mV
Lead Channel Setting Pacing Amplitude: 2 V
Lead Channel Setting Pacing Amplitude: 2.5 V
Lead Channel Setting Pacing Pulse Width: 0.4 ms
Lead Channel Setting Sensing Sensitivity: 1.2 mV

## 2019-11-20 NOTE — Progress Notes (Signed)
Remote pacemaker transmission.   

## 2019-11-22 DIAGNOSIS — M5116 Intervertebral disc disorders with radiculopathy, lumbar region: Secondary | ICD-10-CM | POA: Diagnosis not present

## 2019-11-22 DIAGNOSIS — M9903 Segmental and somatic dysfunction of lumbar region: Secondary | ICD-10-CM | POA: Diagnosis not present

## 2019-11-22 NOTE — Telephone Encounter (Signed)
Hi [name], Are you able to correct the echo date under history from 06/05/19 to 06/05/15 on [name]'s Echo 11/06/19 report?

## 2019-11-28 DIAGNOSIS — M5116 Intervertebral disc disorders with radiculopathy, lumbar region: Secondary | ICD-10-CM | POA: Diagnosis not present

## 2019-11-28 DIAGNOSIS — M9903 Segmental and somatic dysfunction of lumbar region: Secondary | ICD-10-CM | POA: Diagnosis not present

## 2019-12-06 DIAGNOSIS — M5116 Intervertebral disc disorders with radiculopathy, lumbar region: Secondary | ICD-10-CM | POA: Diagnosis not present

## 2019-12-06 DIAGNOSIS — M9903 Segmental and somatic dysfunction of lumbar region: Secondary | ICD-10-CM | POA: Diagnosis not present

## 2019-12-13 DIAGNOSIS — L720 Epidermal cyst: Secondary | ICD-10-CM | POA: Diagnosis not present

## 2019-12-13 DIAGNOSIS — L821 Other seborrheic keratosis: Secondary | ICD-10-CM | POA: Diagnosis not present

## 2019-12-13 DIAGNOSIS — D692 Other nonthrombocytopenic purpura: Secondary | ICD-10-CM | POA: Diagnosis not present

## 2019-12-13 DIAGNOSIS — D1801 Hemangioma of skin and subcutaneous tissue: Secondary | ICD-10-CM | POA: Diagnosis not present

## 2019-12-18 DIAGNOSIS — H04123 Dry eye syndrome of bilateral lacrimal glands: Secondary | ICD-10-CM | POA: Diagnosis not present

## 2019-12-18 DIAGNOSIS — H1045 Other chronic allergic conjunctivitis: Secondary | ICD-10-CM | POA: Diagnosis not present

## 2019-12-18 DIAGNOSIS — H16213 Exposure keratoconjunctivitis, bilateral: Secondary | ICD-10-CM | POA: Diagnosis not present

## 2019-12-18 DIAGNOSIS — H16143 Punctate keratitis, bilateral: Secondary | ICD-10-CM | POA: Diagnosis not present

## 2019-12-19 DIAGNOSIS — M5116 Intervertebral disc disorders with radiculopathy, lumbar region: Secondary | ICD-10-CM | POA: Diagnosis not present

## 2019-12-19 DIAGNOSIS — M9903 Segmental and somatic dysfunction of lumbar region: Secondary | ICD-10-CM | POA: Diagnosis not present

## 2020-01-01 ENCOUNTER — Ambulatory Visit: Payer: Medicare Other

## 2020-01-06 ENCOUNTER — Other Ambulatory Visit: Payer: Self-pay | Admitting: Cardiovascular Disease

## 2020-01-07 NOTE — Telephone Encounter (Signed)
Rx has been sent to the pharmacy electronically. ° °

## 2020-01-10 DIAGNOSIS — M9903 Segmental and somatic dysfunction of lumbar region: Secondary | ICD-10-CM | POA: Diagnosis not present

## 2020-01-10 DIAGNOSIS — M5116 Intervertebral disc disorders with radiculopathy, lumbar region: Secondary | ICD-10-CM | POA: Diagnosis not present

## 2020-01-14 DIAGNOSIS — H04123 Dry eye syndrome of bilateral lacrimal glands: Secondary | ICD-10-CM | POA: Diagnosis not present

## 2020-01-14 DIAGNOSIS — Z961 Presence of intraocular lens: Secondary | ICD-10-CM | POA: Diagnosis not present

## 2020-01-14 DIAGNOSIS — H0288B Meibomian gland dysfunction left eye, upper and lower eyelids: Secondary | ICD-10-CM | POA: Diagnosis not present

## 2020-01-14 DIAGNOSIS — H0288A Meibomian gland dysfunction right eye, upper and lower eyelids: Secondary | ICD-10-CM | POA: Diagnosis not present

## 2020-01-21 DIAGNOSIS — M9903 Segmental and somatic dysfunction of lumbar region: Secondary | ICD-10-CM | POA: Diagnosis not present

## 2020-01-21 DIAGNOSIS — M5116 Intervertebral disc disorders with radiculopathy, lumbar region: Secondary | ICD-10-CM | POA: Diagnosis not present

## 2020-01-26 ENCOUNTER — Other Ambulatory Visit: Payer: Self-pay | Admitting: Cardiovascular Disease

## 2020-01-29 ENCOUNTER — Ambulatory Visit: Payer: Medicare Other | Admitting: Podiatry

## 2020-02-07 ENCOUNTER — Ambulatory Visit
Admission: RE | Admit: 2020-02-07 | Discharge: 2020-02-07 | Disposition: A | Discharge status: Home / Self Care | Payer: Medicare Other | Source: Ambulatory Visit | Attending: Internal Medicine | Admitting: Internal Medicine

## 2020-02-07 ENCOUNTER — Other Ambulatory Visit: Payer: Self-pay

## 2020-02-07 DIAGNOSIS — Z1231 Encounter for screening mammogram for malignant neoplasm of breast: Secondary | ICD-10-CM | POA: Diagnosis not present

## 2020-02-14 ENCOUNTER — Ambulatory Visit: Payer: Medicare Other | Admitting: Podiatry

## 2020-02-14 ENCOUNTER — Other Ambulatory Visit: Payer: Self-pay

## 2020-02-14 DIAGNOSIS — M7672 Peroneal tendinitis, left leg: Secondary | ICD-10-CM

## 2020-02-14 NOTE — Progress Notes (Signed)
She presents today states that she is doing much better with better with her peroneal tendinitis she states that she has started Pilates and is helped with the stretching.  The only place that she has that is tender is the furthest most insertion site of her left Achilles tendon.  Objective: No reproducible pain on palpation of the peroneals or the Achilles tendon today.  Mild tenderness on palpation at its insertion site.  Assessment: Resolving peroneal tendinitis.  Plan: Encouraged her to continue her Pilates and that most likely all of this is going to resolve.

## 2020-02-15 ENCOUNTER — Ambulatory Visit (INDEPENDENT_AMBULATORY_CARE_PROVIDER_SITE_OTHER): Payer: Medicare Other

## 2020-02-15 DIAGNOSIS — I495 Sick sinus syndrome: Secondary | ICD-10-CM

## 2020-02-15 LAB — CUP PACEART REMOTE DEVICE CHECK
Battery Remaining Longevity: 153 mo
Battery Voltage: 3.17 V
Brady Statistic AP VP Percent: 57.53 %
Brady Statistic AP VS Percent: 0.64 %
Brady Statistic AS VP Percent: 0.44 %
Brady Statistic AS VS Percent: 41.41 %
Brady Statistic RA Percent Paced: 54.78 %
Brady Statistic RV Percent Paced: 55.42 %
Date Time Interrogation Session: 20220106220543
Implantable Lead Implant Date: 20101109
Implantable Lead Implant Date: 20101109
Implantable Lead Location: 753859
Implantable Lead Location: 753860
Implantable Lead Model: 4469
Implantable Lead Model: 4470
Implantable Lead Serial Number: 535369
Implantable Lead Serial Number: 657967
Implantable Pulse Generator Implant Date: 20210708
Lead Channel Impedance Value: 266 Ohm
Lead Channel Impedance Value: 399 Ohm
Lead Channel Impedance Value: 437 Ohm
Lead Channel Impedance Value: 456 Ohm
Lead Channel Pacing Threshold Amplitude: 0.5 V
Lead Channel Pacing Threshold Amplitude: 1 V
Lead Channel Pacing Threshold Pulse Width: 0.4 ms
Lead Channel Pacing Threshold Pulse Width: 0.4 ms
Lead Channel Sensing Intrinsic Amplitude: 0.375 mV
Lead Channel Sensing Intrinsic Amplitude: 0.375 mV
Lead Channel Sensing Intrinsic Amplitude: 11.25 mV
Lead Channel Sensing Intrinsic Amplitude: 11.25 mV
Lead Channel Setting Pacing Amplitude: 2 V
Lead Channel Setting Pacing Amplitude: 2.5 V
Lead Channel Setting Pacing Pulse Width: 0.4 ms
Lead Channel Setting Sensing Sensitivity: 1.2 mV

## 2020-02-18 ENCOUNTER — Telehealth: Payer: Self-pay

## 2020-02-18 DIAGNOSIS — H04123 Dry eye syndrome of bilateral lacrimal glands: Secondary | ICD-10-CM | POA: Diagnosis not present

## 2020-02-18 DIAGNOSIS — H0288A Meibomian gland dysfunction right eye, upper and lower eyelids: Secondary | ICD-10-CM | POA: Diagnosis not present

## 2020-02-18 DIAGNOSIS — H16143 Punctate keratitis, bilateral: Secondary | ICD-10-CM | POA: Diagnosis not present

## 2020-02-18 DIAGNOSIS — H16213 Exposure keratoconjunctivitis, bilateral: Secondary | ICD-10-CM | POA: Diagnosis not present

## 2020-02-18 NOTE — Telephone Encounter (Signed)
Unscheduled manual transmission received. Noted ongoing AF event.  Histogram controlled.  Known PAF.  Meds:  Diltiazem, Xarelto.    Pt has known history of PAF, on Xarelto for [place].  Current meds include Diltiazem 180mg  daily; +15-30 mg Q4 hours PRN HR >100; Metoprolol Succinate 75mg  BID.    Spoke with pt, she was aware of being back in AF over the weekend, she indicates that she took an extra 15 mg dose of PRN Diltiazem one day and on another day it was bad enough that she took 30mg  dose in middle of the night.  She is feeling somewhat fatigued with activity today.  Pt states she was going to call AF clinic today, but had not gotten around to it yet.    Pt sent in follow-up transmission while on call, confirming that although previous episode resolved, it appears she went back out of rhythm today.    Current AT/AF Burden 89%.

## 2020-02-19 NOTE — Telephone Encounter (Signed)
Patient was basically unaware of the current AF episode. HR 106-120 BP 120/74 She just wanted reassurance she could continue her exercise classes until she sees Amber next week (offered appt this week pt declined) Discussed with Adline Peals PA will increase metoprolol to 100mg  in the AM and 75mg  in the PM and see if this will help her rates during the day. Pt in agreement.

## 2020-02-26 NOTE — Telephone Encounter (Signed)
Patient has been rescheduled.

## 2020-02-27 ENCOUNTER — Other Ambulatory Visit: Payer: Self-pay

## 2020-02-27 ENCOUNTER — Ambulatory Visit: Payer: Medicare Other | Admitting: Student

## 2020-02-27 ENCOUNTER — Encounter: Payer: Self-pay | Admitting: Student

## 2020-02-27 VITALS — BP 100/68 | HR 93 | Ht 60.0 in | Wt 132.0 lb

## 2020-02-27 DIAGNOSIS — Z950 Presence of cardiac pacemaker: Secondary | ICD-10-CM

## 2020-02-27 DIAGNOSIS — I480 Paroxysmal atrial fibrillation: Secondary | ICD-10-CM | POA: Diagnosis not present

## 2020-02-27 DIAGNOSIS — I495 Sick sinus syndrome: Secondary | ICD-10-CM | POA: Diagnosis not present

## 2020-02-27 LAB — CUP PACEART INCLINIC DEVICE CHECK
Battery Remaining Longevity: 154 mo
Battery Voltage: 3.17 V
Brady Statistic AP VP Percent: 46.09 %
Brady Statistic AP VS Percent: 0.88 %
Brady Statistic AS VP Percent: 0.98 %
Brady Statistic AS VS Percent: 52.06 %
Brady Statistic RA Percent Paced: 40.59 %
Brady Statistic RV Percent Paced: 41.98 %
Date Time Interrogation Session: 20220119144218
Implantable Lead Implant Date: 20101109
Implantable Lead Implant Date: 20101109
Implantable Lead Location: 753859
Implantable Lead Location: 753860
Implantable Lead Model: 4469
Implantable Lead Model: 4470
Implantable Lead Serial Number: 535369
Implantable Lead Serial Number: 657967
Implantable Pulse Generator Implant Date: 20210708
Lead Channel Impedance Value: 285 Ohm
Lead Channel Impedance Value: 418 Ohm
Lead Channel Impedance Value: 437 Ohm
Lead Channel Impedance Value: 475 Ohm
Lead Channel Pacing Threshold Amplitude: 0.625 V
Lead Channel Pacing Threshold Amplitude: 1 V
Lead Channel Pacing Threshold Pulse Width: 0.4 ms
Lead Channel Pacing Threshold Pulse Width: 0.4 ms
Lead Channel Sensing Intrinsic Amplitude: 1.25 mV
Lead Channel Sensing Intrinsic Amplitude: 1.25 mV
Lead Channel Sensing Intrinsic Amplitude: 11.375 mV
Lead Channel Sensing Intrinsic Amplitude: 11.375 mV
Lead Channel Setting Pacing Amplitude: 2 V
Lead Channel Setting Pacing Amplitude: 2.5 V
Lead Channel Setting Pacing Pulse Width: 0.4 ms
Lead Channel Setting Sensing Sensitivity: 1.2 mV

## 2020-02-27 MED ORDER — DILTIAZEM HCL ER COATED BEADS 360 MG PO CP24
360.0000 mg | ORAL_CAPSULE | Freq: Every day | ORAL | 3 refills | Status: DC
  Filled 2020-08-15 – 2020-08-26 (×2): qty 90, 90d supply, fill #0
  Filled 2020-11-25: qty 90, 90d supply, fill #1

## 2020-02-27 MED ORDER — FLECAINIDE ACETATE 50 MG PO TABS: 50.0000 mg | ORAL_TABLET | Freq: Two times a day (BID) | ORAL | 2 refills | Status: DC

## 2020-02-27 MED ORDER — METOPROLOL SUCCINATE ER 50 MG PO TB24
ORAL_TABLET | ORAL | 3 refills | Status: DC
  Filled 2020-08-15: qty 270, 90d supply, fill #0
  Filled 2020-11-17: qty 270, 90d supply, fill #1

## 2020-02-27 NOTE — Patient Instructions (Addendum)
Medication Instructions:  Your physician has recommended you make the following change in your medication: -- START Flecainide 50 MG - Take 1 tablet by mouth twice daily -- NEW RX SENT *If you need a refill on your cardiac medications before your next appointment, please call your pharmacy*  Lab Work: Your physician has recommended that you have lab work today: BMET, Magnesium Level, and CBC  If you have labs (blood work) drawn today and your tests are completely normal, you will receive your results only by: Marland Kitchen MyChart Message (if you have MyChart) OR . A paper copy in the mail If you have any lab test that is abnormal or we need to change your treatment, we will call you to review the results.  Follow-Up: At [facility], you and your health needs are our priority.  As part of our continuing mission to provide you with exceptional heart care, we have created designated Provider Care Teams.  These Care Teams include your primary Cardiologist (physician) and Advanced Practice Providers (APPs -  Physician Assistants and Nurse Practitioners) who all work together to provide you with the care you need, when you need it.  We recommend signing up for the patient portal called "MyChart".  Sign up information is provided on this After Visit Summary.  MyChart is used to connect with patients for Virtual Visits (Telemedicine).  Patients are able to view lab/test results, encounter notes, upcoming appointments, etc.  Non-urgent messages can be sent to your provider as well.   To learn more about what you can do with MyChart, go to [email or web address].    Your next appointment:   Your physician recommends that you schedule a follow-up appointment in: [place] with Doristine Devoid, NP in the [facility] -- Friday 03/07/20 at 3:00 pm  Remote monitoring is used to monitor your Pacemaker from home. This monitoring reduces the number of office visits required to check your device to one time per year. It  allows [place] to keep an eye on the functioning of your device to ensure it is working properly. You are scheduled for a device check from home on 05/16/20. You may send your transmission at any time that day. If you have a wireless device, the transmission will be sent automatically. After your physician reviews your transmission, you will receive a postcard with your next transmission date.  The format for your next appointment:   In Person with The [facility]ib [facility]  AFIB CLINIC INFORMATION: The AFib Clinic is located in the [facility] Clin[facility] at [facility]. Parking instructions/directions: [address] (off [address]). When you pull in to Entrance C, there is an underground parking garage to your right. The code to enter the garage is 1111. Take the elevators to the first floor. Follow the signs to the Heart and Vascular Specialty Clinics. You will see registration at the end of the hallway.  Phone number: [phone]

## 2020-02-27 NOTE — Progress Notes (Signed)
Electrophysiology Office Note Date: 02/27/2020  ID:  Kathryn Gibson, DOB 1933/03/26, MRN [id number]  PCP: Prince Solian, MD Primary Cardiologist: [name], MD Electrophysiologist: Thompson Grayer, MD   CC: Pacemaker follow-up  Kathryn Gibson is a 87 y.o. female seen today for Thompson Grayer, MD for close follow up due to atrial fibrillation.  Since last being seen in our clinic the patient reports more fatigue. She has felt like she is out of rhythm much more often. All through the weekend she has felt out of rhythm. While exercising she gets fatigue much more quickly, and has HRs into the 120s very quickly.   she denies chest pain, PND, orthopnea, nausea, vomiting, dizziness, syncope, edema, weight gain, or early satiety.  Device History: Medtronic Dual Chamber PPM implanted 12/17/2008, gen change 08/2019 for symptomatic bradycardia  Past Medical History:  Diagnosis Date  . Arthritis   . Atrial fibrillation ([place])   . Bradycardia    s/p PPM  . Cataract   . Colonic polyp    adenomatous  . Constipation   . Diverticulosis   . Hemorrhoids   . Hypertension   . Hypothyroidism   . Irritable bowel syndrome (IBS)   . Pacemaker 2010   Past Surgical History:  Procedure Laterality Date  . BREAST CYST ASPIRATION Left 1980s  . COLONOSCOPY    . ELECTROPHYSIOLOGIC STUDY N/A 07/01/2015   Procedure: Atrial Fibrillation Ablation;  Surgeon: Thompson Grayer, MD;  Location: [facility];  Service: Cardiovascular;  Laterality: N/A;  . EYE SURGERY    . HEMATOMA EVACUATION Right 07/15/2014   Procedure: EVACUATION Right Groin Hematoma;  Surgeon: Rosetta Posner, MD;  Location: [facility];  Service: Vascular;  Laterality: Right;  . NASAL ENDOSCOPY WITH EPISTAXIS CONTROL N/A 07/14/2014   Procedure: NASAL ENDOSCOPY WITH EPISTAXIS CONTROL;  Surgeon: Ruby Cola, MD;  Location: [facility];  Service: ENT;  Laterality: N/A;  . NASAL ENDOSCOPY WITH EPISTAXIS CONTROL Bilateral 07/15/2014   Procedure: NASAL  ENDOSCOPY WITH EPISTAXIS CONTROL;  Surgeon: Ruby Cola, MD;  Location: [facility];  Service: ENT;  Laterality: Bilateral;  . PACEMAKER INSERTION  2010   implanted by Dr Doreatha Lew ([facility])  . PPM GENERATOR CHANGEOUT N/A 08/16/2019   Procedure: PPM GENERATOR CHANGEOUT;  Surgeon: Thompson Grayer, MD;  Location: [facility];  Service: Cardiovascular;  Laterality: N/A;  . RADIOLOGY WITH ANESTHESIA N/A 07/14/2014   Procedure: RADIOLOGY WITH ANESTHESIA;  Surgeon: Medication Radiologist, MD;  Location: [facility];  Service: Radiology;  Laterality: N/A;  . TEE WITHOUT CARDIOVERSION N/A 07/01/2015   Procedure: TRANSESOPHAGEAL ECHOCARDIOGRAM (TEE);  Surgeon: Sueanne Margarita, MD;  Location: [facility];  Service: Cardiovascular;  Laterality: N/A;    Current Outpatient Medications  Medication Sig Dispense Refill  . acetaminophen (TYLENOL) 500 MG tablet Take 500-1,000 mg by mouth every 6 (six) hours as needed for moderate pain or headache.    . Artificial Tear Ointment (DRY EYES OP) Place 1 drop into both eyes daily as needed (Dry eye).    . Ascorbic Acid (VITAMIN C) 1000 MG tablet Take 1,000 mg by mouth daily.      . Calcium-Vitamin D (CALTRATE 600 PLUS-VIT D PO) Take 1 tablet by mouth daily.    . cholecalciferol (VITAMIN D) 1000 UNITS tablet Take 1,000 Units by mouth daily.      . Coenzyme Q10 (CO Q-10 PO) Take 1 tablet by mouth daily.    . COLLAGEN PO Take 2 Scoops by mouth daily.    Marland Kitchen  cromolyn (OPTICROM) 4 % ophthalmic solution 1 drop 4 (four) times daily.    Marland Kitchen diltiazem (CARDIZEM) 30 MG tablet Take 1/2 to 1 tablet every 4 hours AS NEEDED for heart rate >100 as long as top blood pressure >100. 45 tablet 1  . flecainide (TAMBOCOR) 50 MG tablet Take 1 tablet (50 mg total) by mouth 2 (two) times daily. 60 tablet 2  . levothyroxine (SYNTHROID) 50 MCG tablet Take 50 mcg by mouth once a week.    . levothyroxine (SYNTHROID) 75 MCG tablet Take 75 mcg by mouth daily before breakfast. 6 days a week    . Lysine 1000 MG TABS  Take 1,000 mg by mouth daily.    . methylcellulose (CITRUCEL) oral powder Take 2 packets by mouth daily.    . Multiple Vitamins-Minerals (ICAPS AREDS 2 PO) Take 1 capsule by mouth daily.     . Omega-3 Fatty Acids (FISH OIL) 1200 MG CAPS Take 1,200 mg by mouth daily. Mega Red.    . polyethylene glycol powder (GLYCOLAX/MIRALAX) powder Take 0.5 Containers by mouth at bedtime.   0  . pravastatin (PRAVACHOL) 10 MG tablet Take 1 tablet by mouth once daily 90 tablet 0  . Probiotic Product (ALIGN PO) Take 1 capsule by mouth daily.    . sodium chloride (OCEAN) 0.65 % SOLN nasal spray Place 1 spray into both nostrils daily.    . temazepam (RESTORIL) 30 MG capsule Take 30 mg by mouth at bedtime.    Alveda Reasons 15 MG TABS tablet TAKE 1 TABLET BY MOUTH ONCE DAILY WITH SUPPER 90 tablet 1  . Zinc 50 MG CAPS Take 50 mg by mouth daily.     Marland Kitchen diltiazem (CARDIZEM CD) 360 MG 24 hr capsule Take 1 capsule (360 mg total) by mouth daily. 90 capsule 3  . metoprolol succinate (TOPROL XL) 50 MG 24 hr tablet Take 75mg  TWICE A DAY 270 tablet 3   No current facility-administered medications for this visit.    Allergies:   Trazodone and nefazodone   Social History: Social History   Socioeconomic History  . Marital status: Widowed    Spouse name: Not on file  . Number of children: 3  . Years of education: Not on file  . Highest education level: Not on file  Occupational History  . Occupation: retired  Tobacco Use  . Smoking status: Former Smoker    Types: Cigarettes    Quit date: 02/09/1980    Years since quitting: 40.0  . Smokeless tobacco: Never Used  Vaping Use  . Vaping Use: Never used  Substance and Sexual Activity  . Alcohol use: Yes    Alcohol/week: 14.0 standard drinks    Types: 14 Glasses of wine per week    Comment: wine 6 days a week  . Drug use: No  . Sexual activity: Not on file  Other Topics Concern  . Not on file  Social History Narrative  . Not on file   Social Determinants of Health    Financial Resource Strain: Not on file  Food Insecurity: Not on file  Transportation Needs: Not on file  Physical Activity: Not on file  Stress: Not on file  Social Connections: Not on file  Intimate Partner Violence: Not on file    Family History: Family History  Problem Relation Age of Onset  . Hypertension Mother   . Congestive Heart Failure Mother   . Dementia Mother   . Heart attack Father 7  MI cause of death  . Diabetes Paternal Grandfather   . CAD Brother        2 stents  . Lung cancer Son   . Leukemia Daughter   . Colon cancer Neg Hx   . Kidney disease Neg Hx   . Liver disease Neg Hx   . Esophageal cancer Neg Hx   . Stroke Neg Hx      Review of Systems: All other systems reviewed and are otherwise negative except as noted above.  Physical Exam: Vitals:   02/27/20 1204  BP: 100/68  Pulse: 93  SpO2: 99%  Weight: 132 lb (59.9 kg)  Height: 5' (1.524 m)     GEN- The patient is well appearing, alert and oriented x 3 today.   HEENT: normocephalic, atraumatic; sclera clear, conjunctiva pink; hearing intact; oropharynx clear; neck supple  Lungs- Clear to ausculation bilaterally, normal work of breathing.  No wheezes, rales, rhonchi Heart- Regular rate and rhythm, no murmurs, rubs or gallops  GI- soft, non-tender, non-distended, bowel sounds present  Extremities- no clubbing or cyanosis. No edema MS- no significant deformity or atrophy Skin- warm and dry, no rash or lesion; PPM pocket well healed Psych- euthymic mood, full affect Neuro- strength and sensation are intact  PPM Interrogation- reviewed in detail today,  See PACEART report  EKG:  EKG is not ordered today.  Recent Labs: 08/14/2019: Hemoglobin 11.9; Platelets 213 10/03/2019: BUN 25; Creatinine, Ser 0.95; Magnesium 2.1; Potassium 4.5; Sodium 131 10/09/2019: TSH 9.780   Wt Readings from Last 3 Encounters:  02/27/20 132 lb (59.9 kg)  11/19/19 130 lb 9.6 oz (59.2 kg)  11/02/19 132 lb 3.2 oz  (60 kg)     Other studies Reviewed: Additional studies/ records that were reviewed today include: Previous EP office notes, Previous remote checks, Most recent labwork.   Assessment and Plan:  1. Symptomatic bradycardia s/p Medtronic PPM  Normal PPM function See Pace Art report No changes today  2.Paroxysmal afib / Atrial tachycardia Afib burden is 15%, but likely higher.   Continue xarelto for chads2vasc score of 4. Denies bleeding.  She is no longer on amiodarone due to elevated LFTs previously.  Rate response recently turned back on.   Unable to program AAIR <-> DDDR as she tracks at 120.  Refuses tikosyn consideration in setting of COVID.  She is willing to try flecainide. She has asymptomatic coronary calcification by imaging. I discussed personally with Dr. Rayann Heman who agrees that trying on low dose flecainide 50 mg BID is appropriate. F/u AF clinic 1 week.  EF normal 10/2019.  Continue diltiazem/Toprol as is at this time.    3. Hypertensive cardiovascular disease Stable  Current medicines are reviewed at length with the patient today.   The patient does not have concerns regarding her medicines.  The following changes were made today:  Flecainide added.  Labs/ tests ordered today include:  Orders Placed This Encounter  Procedures  . Basic Metabolic Panel (BMET)  . Magnesium  . CBC w/Diff    Disposition:   Follow up with AF clinic next week. For EKG and consideration of [facility] if remains out of rhythm.    Jacalyn Lefevre, PA-C  02/27/2020 2:58 PM  [address]  [place] [address] [phone] (office) [phone] (fax)

## 2020-02-28 LAB — BASIC METABOLIC PANEL
BUN/Creatinine Ratio: 16 (ref 12–28)
BUN: 16 mg/dL (ref 8–27)
CO2: 21 mmol/L (ref 20–29)
Calcium: 9.5 mg/dL (ref 8.7–10.3)
Chloride: 99 mmol/L (ref 96–106)
Creatinine, Ser: 1.03 mg/dL — ABNORMAL HIGH (ref 0.57–1.00)
GFR calc Af Amer: 58 mL/min/{1.73_m2} — ABNORMAL LOW (ref >59)
GFR calc non Af Amer: 50 mL/min/{1.73_m2} — ABNORMAL LOW (ref >59)
Sodium: 140 mmol/L (ref 134–144)

## 2020-02-28 LAB — CBC WITH DIFFERENTIAL/PLATELET
Basophils Absolute: 0 10*3/uL (ref 0.0–0.2)
Basos: 0 %
EOS (ABSOLUTE): 0.1 10*3/uL (ref 0.0–0.4)
Eos: 2 %
Hematocrit: 36.3 % (ref 34.0–46.6)
Hemoglobin: 12.1 g/dL (ref 11.1–15.9)
Immature Grans (Abs): 0 10*3/uL (ref 0.0–0.1)
Immature Granulocytes: 0 %
Lymphocytes Absolute: 2.1 10*3/uL (ref 0.7–3.1)
Lymphs: 28 %
MCH: 28.9 pg (ref 26.6–33.0)
MCHC: 33.3 g/dL (ref 31.5–35.7)
MCV: 87 fL (ref 79–97)
Monocytes Absolute: 1.2 10*3/uL — ABNORMAL HIGH (ref 0.1–0.9)
Monocytes: 17 %
Neutrophils Absolute: 3.9 10*3/uL (ref 1.4–7.0)
Neutrophils: 53 %
Platelets: 244 10*3/uL (ref 150–450)
RBC: 4.18 x10E6/uL (ref 3.77–5.28)
RDW: 12.6 % (ref 11.7–15.4)
WBC: 7.3 10*3/uL (ref 3.4–10.8)

## 2020-02-28 LAB — MAGNESIUM: Magnesium: 2.2 mg/dL (ref 1.6–2.3)

## 2020-02-29 ENCOUNTER — Encounter: Payer: Medicare Other | Admitting: Nurse Practitioner

## 2020-02-29 NOTE — Progress Notes (Signed)
Remote pacemaker transmission.   

## 2020-03-07 ENCOUNTER — Other Ambulatory Visit: Payer: Self-pay

## 2020-03-07 ENCOUNTER — Ambulatory Visit (HOSPITAL_COMMUNITY)
Admission: RE | Admit: 2020-03-07 | Discharge: 2020-03-07 | Disposition: A | Discharge status: Home / Self Care | Payer: Medicare Other | Source: Ambulatory Visit | Attending: Nurse Practitioner | Admitting: Nurse Practitioner

## 2020-03-07 ENCOUNTER — Encounter (HOSPITAL_COMMUNITY): Payer: Self-pay | Admitting: Nurse Practitioner

## 2020-03-07 VITALS — BP 116/66 | HR 61 | Ht 60.0 in | Wt 135.0 lb

## 2020-03-07 DIAGNOSIS — I480 Paroxysmal atrial fibrillation: Secondary | ICD-10-CM

## 2020-03-07 DIAGNOSIS — D6869 Other thrombophilia: Secondary | ICD-10-CM

## 2020-03-07 DIAGNOSIS — Z79899 Other long term (current) drug therapy: Secondary | ICD-10-CM | POA: Diagnosis not present

## 2020-03-07 DIAGNOSIS — I4891 Unspecified atrial fibrillation: Secondary | ICD-10-CM | POA: Insufficient documentation

## 2020-03-07 NOTE — Progress Notes (Signed)
Pt in for EKG after starting flecainide 50 mg bid for increased afib burden. She has felt less palpitations with onset of flecainide. Her ekg shows av dual paced rhythm at 61 bpm, qrs int 180 ms, qtc 519 ms. Pt will come back for f/u in 2 weeks

## 2020-03-16 DIAGNOSIS — J189 Pneumonia, unspecified organism: Secondary | ICD-10-CM | POA: Diagnosis not present

## 2020-03-16 DIAGNOSIS — R0602 Shortness of breath: Secondary | ICD-10-CM | POA: Diagnosis not present

## 2020-03-16 DIAGNOSIS — Z20822 Contact with and (suspected) exposure to covid-19: Secondary | ICD-10-CM | POA: Diagnosis not present

## 2020-03-16 DIAGNOSIS — J90 Pleural effusion, not elsewhere classified: Secondary | ICD-10-CM | POA: Diagnosis not present

## 2020-03-18 DIAGNOSIS — Z7901 Long term (current) use of anticoagulants: Secondary | ICD-10-CM | POA: Diagnosis not present

## 2020-03-18 DIAGNOSIS — I2510 Atherosclerotic heart disease of native coronary artery without angina pectoris: Secondary | ICD-10-CM | POA: Diagnosis not present

## 2020-03-18 DIAGNOSIS — I480 Paroxysmal atrial fibrillation: Secondary | ICD-10-CM | POA: Diagnosis not present

## 2020-03-18 DIAGNOSIS — I10 Essential (primary) hypertension: Secondary | ICD-10-CM | POA: Diagnosis not present

## 2020-03-20 NOTE — Progress Notes (Signed)
Cardiology Clinic Note   Patient Name: Kathryn Gibson Date of Encounter: 03/21/2020  Primary Care Provider:  Prince Solian, MD Primary Cardiologist:  [name], MD  Patient Profile    Kathryn Gibson 87 year old female presents the clinic today for follow-up evaluation of her atrial fibrillation and hyperlipidemia.  Past Medical History    Past Medical History:  Diagnosis Date  . Arthritis   . Atrial fibrillation ([place])   . Bradycardia    s/p PPM  . Cataract   . Colonic polyp    adenomatous  . Constipation   . Diverticulosis   . Hemorrhoids   . Hypertension   . Hypothyroidism   . Irritable bowel syndrome (IBS)   . Pacemaker 2010   Past Surgical History:  Procedure Laterality Date  . BREAST CYST ASPIRATION Left 1980s  . COLONOSCOPY    . ELECTROPHYSIOLOGIC STUDY N/A 07/01/2015   Procedure: Atrial Fibrillation Ablation;  Surgeon: Thompson Grayer, MD;  Location: [facility];  Service: Cardiovascular;  Laterality: N/A;  . EYE SURGERY    . HEMATOMA EVACUATION Right 07/15/2014   Procedure: EVACUATION Right Groin Hematoma;  Surgeon: Rosetta Posner, MD;  Location: [facility];  Service: Vascular;  Laterality: Right;  . NASAL ENDOSCOPY WITH EPISTAXIS CONTROL N/A 07/14/2014   Procedure: NASAL ENDOSCOPY WITH EPISTAXIS CONTROL;  Surgeon: Ruby Cola, MD;  Location: [facility];  Service: ENT;  Laterality: N/A;  . NASAL ENDOSCOPY WITH EPISTAXIS CONTROL Bilateral 07/15/2014   Procedure: NASAL ENDOSCOPY WITH EPISTAXIS CONTROL;  Surgeon: Ruby Cola, MD;  Location: [facility];  Service: ENT;  Laterality: Bilateral;  . PACEMAKER INSERTION  2010   implanted by Dr Doreatha Lew ([facility])  . PPM GENERATOR CHANGEOUT N/A 08/16/2019   Procedure: PPM GENERATOR CHANGEOUT;  Surgeon: Thompson Grayer, MD;  Location: [facility];  Service: Cardiovascular;  Laterality: N/A;  . RADIOLOGY WITH ANESTHESIA N/A 07/14/2014   Procedure: RADIOLOGY WITH ANESTHESIA;  Surgeon: Medication Radiologist, MD;  Location: [facility];   Service: Radiology;  Laterality: N/A;  . TEE WITHOUT CARDIOVERSION N/A 07/01/2015   Procedure: TRANSESOPHAGEAL ECHOCARDIOGRAM (TEE);  Surgeon: Sueanne Margarita, MD;  Location: [facility];  Service: Cardiovascular;  Laterality: N/A;    Allergies  Allergies  Allergen Reactions  . Trazodone And Nefazodone     "twitching" and trembling    History of Present Illness    [name] has a PMH of atrial fibrillation status post ablation, sick sinus syndrome status post PPM, asymptomatic coronary calcification, and hypertension.  She was previously a patient of Dr. Mare Ferrari.  She underwent ablation for A. fib 5/17.  She was last noted to have atrial fibrillation 11/17.  She previously reported that she has been doing very well rarely have palpitations that did not last very long.  Her PPM was noted to be nearing ERI and is being followed monthly.  She continued to exercise daily.  She did not report any exertional symptoms.  She was noted to have mild lower extremity edema attributed to varicose veins which was improved with the use of lower extremity compression stockings.  She previously denied orthopnea and PND.  Her amiodarone was previously increased due to atrial tachycardia by EP.  It was later discontinued due to elevated LFTs which subsequently normalized.  She was seen by Dr. Ainsley Spinner 6/20 and reported exertional dyspnea.  She was offered stress testing however deferred because she wanted to think about/consider the test.  Her device was interrogated 01/08/2019 and showed she was nearing  ERI.  It reported no episodes of atrial fibrillation.  She did report and have her generator changed out.  Her beta-blocker was discontinued due to hypotension.  She was noted to have some episodes of atrial fibrillation with RVR.  She was noted to be in atrial fibrillation 08/27/2019.  Her beta-blocker was resumed and she reported feeling better at that time.  She was noted to have 1.9% atrial fibrillation burden  with longest episode lasting 18 minutes.  She has been monitoring her BP and noted it medication might be as low as 90s over 50s.  She did not report feeling bad with low blood pressures.  Her heart rate was noted to increase to 10 1-120.  When she was last seen by Dr. Oval Linsey on 09/05/2019 she reported she had been struggling with bronchitis for 2 weeks.  She she was not walking at that time due to fatigue.  She followed up with Dr. Rayann Heman 11/19/2019.  During that time she reported feeling well.  She was noted to have atrial tachycardia and had paced termination 10/03/2019.  She was reprogrammed to DDI at that time.  She reported she was feeling better however, had reduced exercise tolerance.  She denied palpitations, chest pain, dizziness, presyncope and syncope.  Her rate response was turned back on.  She was noted to be not device dependent.  It was felt that at some point she may need to return to AAIR-DDD R pacing depending on her symptoms.  She was noted to have less than 0.1% A. fib burden at that time.  Chest Vascor noted to be 5 and continued on Xarelto 15 mg daily.  She was seen by Roderic Palau 03/07/2020 for an EKG after starting flecainide 50 mg twice daily due to atrial fibrillation increased burden.  She reported less palpitations with taking flecainide.  Her EKG showed AV dual paced rhythm 61 bpm QRS 180, QTc 519.  Follow-up plan for 2 weeks.  She presented to [facility]16[facility]20 and was given doxycycline and prednisone.  She was diagnosed with a community acquired pneumonia.  Her CXR showed mild interstitial pulmonary edema with trace right pulmonary effusion.  Covid negative.  She presents the clinic today for follow-up evaluation states she feels well today.  She reports she presented to her PCP who suggested she may have CHF.  On exam today she reports no increased lower extremity swelling and that her breathing is improving.  Her steroids and antibiotics were  discontinued at that time.  She continues to be very physically active swimming and doing Pilates at [facility].  She reports she also follows a low-sodium diet.  I will order an echocardiogram, give her the salty 6 diet sheet, schedule follow-up with Dr. Oval Linsey in July, and have her maintain her physical activity.  Today she denies chest pain, increased shortness of breath, lower extremity edema, fatigue, palpitations, melena, hematuria, hemoptysis, diaphoresis, weakness, presyncope, syncope, orthopnea, and PND.   Home Medications    Prior to Admission medications   Medication Sig Start Date End Date Taking? Authorizing Provider  acetaminophen (TYLENOL) 500 MG tablet Take 500-1,000 mg by mouth every 6 (six) hours as needed for moderate pain or headache.    [provider]  Artificial Tear Ointment (DRY EYES OP) Place 1 drop into both eyes daily as needed (Dry eye).    [provider]  Ascorbic Acid (VITAMIN C) 1000 MG tablet Take 1,000 mg by mouth daily.      [provider]  Calcium-Vitamin D (CALTRATE 600 PLUS-VIT D PO) Take 1 tablet by mouth daily.    [provider]  cholecalciferol (VITAMIN D) 1000 UNITS tablet Take 1,000 Units by mouth daily.      [provider]  Coenzyme Q10 (CO Q-10 PO) Take 1 tablet by mouth daily.    [provider]  COLLAGEN PO Take 2 Scoops by mouth daily.    [provider]  cromolyn (OPTICROM) 4 % ophthalmic solution 1 drop 4 (four) times daily. 10/17/19   [provider]  diltiazem (CARDIZEM CD) 360 MG 24 hr capsule Take 1 capsule (360 mg total) by mouth daily. 02/27/20   Shirley Friar, PA-C  diltiazem (CARDIZEM) 30 MG tablet Take 1/2 to 1 tablet every 4 hours AS NEEDED for heart rate >100 as long as top blood pressure >100. 11/02/19   Sherran Needs, NP  flecainide (TAMBOCOR) 50 MG tablet Take 1 tablet (50 mg total) by mouth 2 (two) times daily. 02/27/20   Shirley Friar,  PA-C  levothyroxine (SYNTHROID) 50 MCG tablet Take 50 mcg by mouth once a week.    [provider]  levothyroxine (SYNTHROID) 75 MCG tablet Take 75 mcg by mouth daily before breakfast. 6 days a week    [provider]  Lysine 1000 MG TABS Take 1,000 mg by mouth as needed.    [provider]  methylcellulose (CITRUCEL) oral powder Take 2 packets by mouth daily.    [provider]  metoprolol succinate (TOPROL XL) 50 MG 24 hr tablet Take 75mg  TWICE A DAY 02/27/20   Shirley Friar, PA-C  Multiple Vitamins-Minerals (ICAPS AREDS 2 PO) Take 1 capsule by mouth daily.     [provider]  Omega-3 Fatty Acids (FISH OIL) 1200 MG CAPS Take 1,200 mg by mouth daily. Mega Red.    [provider]  polyethylene glycol powder (GLYCOLAX/MIRALAX) powder Take 0.5 Containers by mouth at bedtime.     [provider]  pravastatin (PRAVACHOL) 10 MG tablet Take 1 tablet by mouth once daily 01/28/20   Allred, Jeneen Rinks, MD  Probiotic Product (ALIGN PO) Take 1 capsule by mouth daily.    [provider]  sodium chloride (OCEAN) 0.65 % SOLN nasal spray Place 1 spray into both nostrils daily.    [provider]  temazepam (RESTORIL) 30 MG capsule Take 30 mg by mouth at bedtime.    [provider]  XARELTO 15 MG TABS tablet TAKE 1 TABLET BY MOUTH ONCE DAILY WITH SUPPER 01/07/20   [name], MD  Zinc 50 MG CAPS Take 50 mg by mouth daily.  01/22/19   [provider]    Family History    Family History  Problem Relation Age of Onset  . Hypertension Mother   . Congestive Heart Failure Mother   . Dementia Mother   . Heart attack Father 40       MI cause of death  . Diabetes Paternal Grandfather   . CAD Brother        2 stents  . Lung cancer Son   . Leukemia Daughter   . Colon cancer Neg Hx   . Kidney disease Neg Hx   . Liver disease Neg Hx   . Esophageal cancer Neg Hx   . Stroke Neg Hx    She indicated  that her mother is deceased. She indicated that her father is deceased. She indicated that her brother is alive. She indicated that  her maternal grandmother is deceased. She indicated that her maternal grandfather is deceased. She indicated that her paternal grandmother is deceased. She indicated that her paternal grandfather is deceased. She indicated that her daughter is deceased. She indicated that the status of her son is unknown. She indicated that the status of her neg hx is unknown.  Social History    Social History   Socioeconomic History  . Marital status: Widowed    Spouse name: Not on file  . Number of children: 3  . Years of education: Not on file  . Highest education level: Not on file  Occupational History  . Occupation: retired  Tobacco Use  . Smoking status: Former Smoker    Types: Cigarettes    Quit date: 02/09/1980    Years since quitting: 40.1  . Smokeless tobacco: Never Used  Vaping Use  . Vaping Use: Never used  Substance and Sexual Activity  . Alcohol use: Yes    Alcohol/week: 14.0 standard drinks    Types: 14 Glasses of wine per week    Comment: wine 6 days a week  . Drug use: No  . Sexual activity: Not on file  Other Topics Concern  . Not on file  Social History Narrative  . Not on file   Social Determinants of Health   Financial Resource Strain: Not on file  Food Insecurity: Not on file  Transportation Needs: Not on file  Physical Activity: Not on file  Stress: Not on file  Social Connections: Not on file  Intimate Partner Violence: Not on file     Review of Systems    General:  No chills, fever, night sweats or weight changes.  Cardiovascular:  No chest pain, dyspnea on exertion, edema, orthopnea, palpitations, paroxysmal nocturnal dyspnea. Dermatological: No rash, lesions/masses Respiratory: No cough, dyspnea Urologic: No hematuria, dysuria Abdominal:   No nausea, vomiting, diarrhea, bright red blood per rectum, melena, or  hematemesis Neurologic:  No visual changes, wkns, changes in mental status. All other systems reviewed and are otherwise negative except as noted above.  Physical Exam    VS:  BP 126/68   Pulse 62   Ht 5' 0.5" (1.537 m)   Wt 131 lb (59.4 kg)   SpO2 97%   BMI 25.16 kg/m  , BMI Body mass index is 25.16 kg/m. GEN: Well nourished, well developed, in no acute distress. HEENT: normal. Neck: Supple, no JVD, carotid bruits, or masses. Cardiac: RRR, no murmurs, rubs, or gallops. No clubbing, cyanosis, edema.  Radials/DP/PT 2+ and equal bilaterally.  Respiratory:  Respirations regular and unlabored, clear to auscultation bilaterally. GI: Soft, nontender, nondistended, BS + x 4. MS: no deformity or atrophy. Skin: warm and dry, no rash. Neuro:  Strength and sensation are intact. Psych: Normal affect.  Accessory Clinical Findings    Recent Labs: 10/09/2019: TSH 9.780 02/27/2020: BUN 16; Creatinine, Ser 1.03; Hemoglobin 12.1; Magnesium 2.2; Platelets 244; Potassium CANCELED; Sodium 140   Recent Lipid Panel    Component Value Date/Time   CHOL 167 10/08/2015 0848   TRIG 56 10/08/2015 0848   HDL 94 10/08/2015 0848   CHOLHDL 1.8 10/08/2015 0848   VLDL 11 10/08/2015 0848   LDLCALC 62 10/08/2015 0848    ECG personally reviewed by me today-none today.  Echocardiogram 11/06/2019  IMPRESSIONS    1. Left ventricular ejection fraction, by estimation, is 55 to 60%. The  left ventricle has normal function. The left ventricle has no regional  wall motion abnormalities. There is mild  concentric left ventricular  hypertrophy. Left ventricular diastolic  parameters are indeterminate. Elevated left ventricular end-diastolic  pressure. The average left ventricular global longitudinal strain is -21.4  %. The global longitudinal strain is normal.  2. Right ventricular systolic function is normal. The right ventricular  size is normal. There is mildly elevated pulmonary artery systolic   pressure.  3. Left atrial size was severely dilated.  4. The mitral valve is normal in structure. Mild mitral valve  regurgitation. No evidence of mitral stenosis.  5. Tricuspid valve regurgitation is mild to moderate.  6. The aortic valve is normal in structure. There is mild calcification  of the aortic valve. There is mild thickening of the aortic valve. Aortic  valve regurgitation is moderate. No aortic stenosis is present. Aortic  regurgitation PHT measures 406 msec.  7. The inferior vena cava is normal in size with <50% respiratory  variability, suggesting right atrial pressure of 8 mmHg.  Assessment & Plan   1.  Dyspnea on exertion- much improved. Increasing physical activity. Recently seen and evaluated at [facility] 03/16/2020.  She was prescribed doxycycline and prednisone, stop by PCP.  Chest x-ray showed mild interstitial pulmonary edema with trace right pulmonary effusion.  Reports she is compliant with her antibiotics and steroids. Increase physical activity as tolerated Maintain p.o. hydration Order echocardiogram  Coronary calcification-no chest pain today.  Has resumed her daily exercise.  Reports she is feeling well. Continue  metoprolol, pravastatin, Heart healthy low-sodium diet-salty 6 given Increase physical activity as tolerated  Hyperlipidemia-LDL 92 on 04/24/2019 Continue pravastatin, co-Q10 Heart healthy low-sodium high-fiber diet Increase physical activity as tolerated Repeat fasting lipids  Atrial fibrillation-heart rate today 62 BPM.  Reports feeling better after starting flecainide.  Following with Dr. Rayann Heman and A. fib clinic.  Device interrogated 02/27/2020 and showed normal function. Continue diltiazem, flecainide, metoprolol, Xarelto Heart healthy low-sodium diet-salty 6 given Increase physical activity as tolerated Follows with A. fib clinic-keep follow-up appointment  Disposition: Follow-up with Dr. Oval Linsey in 6 months.  Jossie Ng.  Ayala Ribble NP-C    03/21/2020, 10:13 AM [place] [address] Office [phone] Fax [phone]  Notice: This dictation was prepared with Dragon dictation along with smaller phrase technology. Any transcriptional errors that result from this process are unintentional and may not be corrected upon review.  I spent 12 minutes examining this patient, reviewing medications, and using patient centered shared decision making involving her cardiac care.  Prior to her visit I spent greater than 20 minutes reviewing her past medical history,  medications, and prior cardiac tests.

## 2020-03-21 ENCOUNTER — Encounter: Payer: Self-pay | Admitting: General Practice

## 2020-03-21 ENCOUNTER — Ambulatory Visit (HOSPITAL_COMMUNITY)
Admission: RE | Admit: 2020-03-21 | Discharge: 2020-03-21 | Disposition: A | Discharge status: Home / Self Care | Payer: Medicare Other | Source: Ambulatory Visit | Attending: Nurse Practitioner | Admitting: Nurse Practitioner

## 2020-03-21 ENCOUNTER — Ambulatory Visit: Payer: Medicare Other | Admitting: General Practice

## 2020-03-21 ENCOUNTER — Other Ambulatory Visit: Payer: Self-pay

## 2020-03-21 ENCOUNTER — Encounter (HOSPITAL_COMMUNITY): Payer: Self-pay | Admitting: Nurse Practitioner

## 2020-03-21 VITALS — BP 126/68 | HR 62 | Ht 60.5 in | Wt 131.0 lb

## 2020-03-21 VITALS — BP 126/74 | HR 61 | Ht 60.5 in | Wt 131.0 lb

## 2020-03-21 DIAGNOSIS — Z888 Allergy status to other drugs, medicaments and biological substances status: Secondary | ICD-10-CM | POA: Insufficient documentation

## 2020-03-21 DIAGNOSIS — E7800 Pure hypercholesterolemia, unspecified: Secondary | ICD-10-CM

## 2020-03-21 DIAGNOSIS — Z950 Presence of cardiac pacemaker: Secondary | ICD-10-CM | POA: Insufficient documentation

## 2020-03-21 DIAGNOSIS — I2510 Atherosclerotic heart disease of native coronary artery without angina pectoris: Secondary | ICD-10-CM

## 2020-03-21 DIAGNOSIS — I959 Hypotension, unspecified: Secondary | ICD-10-CM | POA: Insufficient documentation

## 2020-03-21 DIAGNOSIS — D6869 Other thrombophilia: Secondary | ICD-10-CM

## 2020-03-21 DIAGNOSIS — R0609 Other forms of dyspnea: Secondary | ICD-10-CM

## 2020-03-21 DIAGNOSIS — I480 Paroxysmal atrial fibrillation: Secondary | ICD-10-CM | POA: Diagnosis not present

## 2020-03-21 DIAGNOSIS — I4891 Unspecified atrial fibrillation: Secondary | ICD-10-CM | POA: Insufficient documentation

## 2020-03-21 DIAGNOSIS — R0600 Dyspnea, unspecified: Secondary | ICD-10-CM | POA: Diagnosis not present

## 2020-03-21 DIAGNOSIS — Z79890 Hormone replacement therapy (postmenopausal): Secondary | ICD-10-CM | POA: Diagnosis not present

## 2020-03-21 DIAGNOSIS — Z7901 Long term (current) use of anticoagulants: Secondary | ICD-10-CM | POA: Diagnosis not present

## 2020-03-21 DIAGNOSIS — Z87891 Personal history of nicotine dependence: Secondary | ICD-10-CM | POA: Diagnosis not present

## 2020-03-21 DIAGNOSIS — Z79899 Other long term (current) drug therapy: Secondary | ICD-10-CM | POA: Insufficient documentation

## 2020-03-21 NOTE — Patient Instructions (Addendum)
Medication Instructions:  The current medical regimen is effective;  continue present plan and medications as directed. Please refer to the Current Medication list given to you today.  *If you need a refill on your cardiac medications before your next appointment, please call your pharmacy*  Lab Work:    NONE  Testing/Procedures:  Echocardiogram - Your physician has requested that you have an echocardiogram. Echocardiography is a painless test that uses sound waves to create images of your heart. It provides your doctor with information about the size and shape of your heart and how well your heart's chambers and valves are working. This procedure takes approximately one hour. There are no restrictions for this procedure. This will be performed at our [facility] location - [address].  Special Instructions PLEASE READ AND FOLLOW SALTY 6-ATTACHED-1,800mg  daily  PLEASE INCREASE PHYSICAL ACTIVITY AS TOLERATED  Follow-Up: Your next appointment:  [place]  In Person with [name], MD OR IF UNAVAILABLE [place], FNP-C   Please call our office 2 months in advance to schedule this appointment   At [facility], you and your health needs are our priority.  As part of our continuing mission to provide you with exceptional heart care, we have created designated Provider Care Teams.  These Care Teams include your primary Cardiologist (physician) and Advanced Practice Providers (APPs -  Physician Assistants and Nurse Practitioners) who all work together to provide you with the care you need, when you need it.            6 SALTY THINGS TO AVOID     1,800MG  DAILY

## 2020-03-21 NOTE — Progress Notes (Signed)
Primary Care Physician: Prince Solian, MD Referring Physician: self referred EP: Kathryn Gibson is a 87 y.o. female with a h/o afib previously on amiodarone, stopped previously for elevated liver enzymes. She had a recent gen change out. At that time she was having some soft BP's and BB was stopped. After a few days she was calling the afib office with c/o that she felt her HR was running too fast. He was started back on low dose BB and called the office again on Friday, again with HR's in the 90's, 'heart beating too fast."  We resumed her piror BB dose. Hr had one dip in her BP, drank some water and BP improved. Asked to be seen this am as she felt her heart rate was irregular. Now in the office, she is a paced. She does not  feel the irregular HB now. She is due Thursday for wound check in the device clinic and can ask for her device to be interrogated to further understand heart irregularities.   Kathryn Gibson wanted to be seen today,11/02/19, for having 2 episodes of irregular rhythm 2 days in a row as well as she doesn't understand why she is short of breath with waking but can do water aerobic's for an hour with  weights and pool pull up's and does not have  any shortness of breath with these activities. She  is also concerned that her weight use to run 124-128 lbs and now it runs 128 to 132 lbs. She  did have her thyroid med adjusted the end of August for an elevated TSH. She is in a junctional rhythm today.   Afib clinic,03/21/20, I  saw Kathryn Gibson 2 weeks ago for f/u of starting flecainide 50 mg bid. At that time, had noted less palpitations. Now she tells me that she was taking just once a day when I saw her, but for the last 2 weeks has been taking bid. She also reports that last Sunday pm, she woke up and was having difficulty catching  her breath. She went to [facility] and was told she may have an early pneumonia and was placed on steroids and antibiotics. She had f/u with her PCP  that though it was HF and stopped  meds but after just 3 days on the meds, she had improved. She saw Kathryn Gibson this am and he ordered an echo that is pending 3/4. She has not had any further difficulty. She has not noted any further palpitations.    Today, she denies symptoms of palpitations, chest pain, shortness of breath, orthopnea, PND, lower extremity edema, dizziness, presyncope, syncope, or neurologic sequela. The patient is tolerating medications without difficulties and is otherwise without complaint today.   Past Medical History:  Diagnosis Date  . Arthritis   . Atrial fibrillation ([place])   . Bradycardia    s/p PPM  . Cataract   . Colonic polyp    adenomatous  . Constipation   . Diverticulosis   . Hemorrhoids   . Hypertension   . Hypothyroidism   . Irritable bowel syndrome (IBS)   . Pacemaker 2010   Past Surgical History:  Procedure Laterality Date  . BREAST CYST ASPIRATION Left 1980s  . COLONOSCOPY    . ELECTROPHYSIOLOGIC STUDY N/A 07/01/2015   Procedure: Atrial Fibrillation Ablation;  Surgeon: Thompson Grayer, MD;  Location: [place];  Service: Cardiovascular;  Laterality: N/A;  . EYE SURGERY    . HEMATOMA EVACUATION Right  07/15/2014   Procedure: EVACUATION Right Groin Hematoma;  Surgeon: Rosetta Posner, MD;  Location: [place];  Service: Vascular;  Laterality: Right;  . NASAL ENDOSCOPY WITH EPISTAXIS CONTROL N/A 07/14/2014   Procedure: NASAL ENDOSCOPY WITH EPISTAXIS CONTROL;  Surgeon: Ruby Cola, MD;  Location: [place];  Service: ENT;  Laterality: N/A;  . NASAL ENDOSCOPY WITH EPISTAXIS CONTROL Bilateral 07/15/2014   Procedure: NASAL ENDOSCOPY WITH EPISTAXIS CONTROL;  Surgeon: Ruby Cola, MD;  Location: [place];  Service: ENT;  Laterality: Bilateral;  . PACEMAKER INSERTION  2010   implanted by Dr Doreatha Lew (MDT)  . PPM GENERATOR CHANGEOUT N/A 08/16/2019   Procedure: PPM GENERATOR CHANGEOUT;  Surgeon: Thompson Grayer, MD;  Location: [place] [facility];  Service:  Cardiovascular;  Laterality: N/A;  . RADIOLOGY WITH ANESTHESIA N/A 07/14/2014   Procedure: RADIOLOGY WITH ANESTHESIA;  Surgeon: Medication Radiologist, MD;  Location: [place];  Service: Radiology;  Laterality: N/A;  . TEE WITHOUT CARDIOVERSION N/A 07/01/2015   Procedure: TRANSESOPHAGEAL ECHOCARDIOGRAM (TEE);  Surgeon: Sueanne Margarita, MD;  Location: [facility];  Service: Cardiovascular;  Laterality: N/A;    Current Outpatient Medications  Medication Sig Dispense Refill  . acetaminophen (TYLENOL) 500 MG tablet Take 500-1,000 mg by mouth every 6 (six) hours as needed for moderate pain or headache.    . Artificial Tear Ointment (DRY EYES OP) Place 1 drop into both eyes daily as needed (Dry eye).    . Ascorbic Acid (VITAMIN C) 1000 MG tablet Take 1,000 mg by mouth daily.      . Calcium-Vitamin D (CALTRATE 600 PLUS-VIT D PO) Take 1 tablet by mouth daily.    . cholecalciferol (VITAMIN D) 1000 UNITS tablet Take 1,000 Units by mouth daily.      . Coenzyme Q10 (CO Q-10 PO) Take 1 tablet by mouth daily.    . COLLAGEN PO Take 2 Scoops by mouth daily.    . cromolyn (OPTICROM) 4 % ophthalmic solution 1 drop 4 (four) times daily.    Marland Kitchen diltiazem (CARDIZEM CD) 360 MG 24 hr capsule Take 1 capsule (360 mg total) by mouth daily. 90 capsule 3  . diltiazem (CARDIZEM) 30 MG tablet Take 1/2 to 1 tablet every 4 hours AS NEEDED for heart rate >100 as long as top blood pressure >100. 45 tablet 1  . flecainide (TAMBOCOR) 50 MG tablet Take 1 tablet (50 mg total) by mouth 2 (two) times daily. 60 tablet 2  . levothyroxine (SYNTHROID) 50 MCG tablet Take 50 mcg by mouth once a week.    . levothyroxine (SYNTHROID) 75 MCG tablet Take 75 mcg by mouth daily before breakfast. 6 days a week    . Lysine 1000 MG TABS Take 1,000 mg by mouth as needed.    . methylcellulose (CITRUCEL) oral powder Take 2 packets by mouth daily.    . metoprolol succinate (TOPROL XL) 50 MG 24 hr tablet Take 75mg  TWICE A DAY 270 tablet 3  . Multiple  Vitamins-Minerals (ICAPS AREDS 2 PO) Take 1 capsule by mouth daily.     . Omega-3 Fatty Acids (FISH OIL) 1200 MG CAPS Take 1,200 mg by mouth daily. Mega Red.    . polyethylene glycol powder (GLYCOLAX/MIRALAX) powder Take 0.5 Containers by mouth at bedtime.   0  . pravastatin (PRAVACHOL) 10 MG tablet Take 1 tablet by mouth once daily 90 tablet 0  . Probiotic Product (ALIGN PO) Take 1 capsule by mouth daily.    . sodium chloride (OCEAN) 0.65 % SOLN nasal spray  Place 1 spray into both nostrils daily.    . temazepam (RESTORIL) 30 MG capsule Take 30 mg by mouth at bedtime.    Alveda Reasons 15 MG TABS tablet TAKE 1 TABLET BY MOUTH ONCE DAILY WITH SUPPER 90 tablet 1  . Zinc 50 MG CAPS Take 50 mg by mouth daily.      No current facility-administered medications for this encounter.    Allergies  Allergen Reactions  . Trazodone And Nefazodone     "twitching" and trembling    Social History   Socioeconomic History  . Marital status: Widowed    Spouse name: Not on file  . Number of children: 3  . Years of education: Not on file  . Highest education level: Not on file  Occupational History  . Occupation: retired  Tobacco Use  . Smoking status: Former Smoker    Types: Cigarettes    Quit date: 02/09/1980    Years since quitting: 40.1  . Smokeless tobacco: Never Used  Vaping Use  . Vaping Use: Never used  Substance and Sexual Activity  . Alcohol use: Yes    Alcohol/week: 14.0 standard drinks    Types: 14 Glasses of wine per week    Comment: wine 6 days a week  . Drug use: No  . Sexual activity: Not on file  Other Topics Concern  . Not on file  Social History Narrative  . Not on file   Social Determinants of Health   Financial Resource Strain: Not on file  Food Insecurity: Not on file  Transportation Needs: Not on file  Physical Activity: Not on file  Stress: Not on file  Social Connections: Not on file  Intimate Partner Violence: Not on file    Family History  Problem Relation  Age of Onset  . Hypertension Mother   . Congestive Heart Failure Mother   . Dementia Mother   . Heart attack Father 76       MI cause of death  . Diabetes Paternal Grandfather   . CAD Brother        2 stents  . Lung cancer Son   . Leukemia Daughter   . Colon cancer Neg Hx   . Kidney disease Neg Hx   . Liver disease Neg Hx   . Esophageal cancer Neg Hx   . Stroke Neg Hx     ROS- All systems are reviewed and negative except as per the HPI above  Physical Exam: Vitals:   03/21/20 1441  BP: 126/74  Pulse: 61  Weight: 59.4 kg  Height: 5' 0.5" (1.537 m)   Wt Readings from Last 3 Encounters:  03/21/20 59.4 kg  03/21/20 59.4 kg  03/07/20 61.2 kg    Labs: Lab Results  Component Value Date   NA 140 02/27/2020   K CANCELED 02/27/2020   CL 99 02/27/2020   CO2 21 02/27/2020   GLUCOSE CANCELED 02/27/2020   BUN 16 02/27/2020   CREATININE 1.03 (H) 02/27/2020   CALCIUM 9.5 02/27/2020   MG 2.2 02/27/2020   Lab Results  Component Value Date   INR 2.3 06/24/2015   Lab Results  Component Value Date   CHOL 167 10/08/2015   HDL 94 10/08/2015   LDLCALC 62 10/08/2015   TRIG 56 10/08/2015     GEN- The patient is well appearing, alert and oriented x 3 today.   Head- normocephalic, atraumatic Eyes-  Sclera clear, conjunctiva pink Ears- hearing intact Oropharynx- clear Neck- supple, no JVP Lymph- no  cervical lymphadenopathy Lungs- Clear to ausculation bilaterally, normal work of breathing Heart- Regular rate and rhythm, no murmurs, rubs or gallops, PMI not laterally displaced GI- soft, NT, ND, + BS Extremities- no clubbing, cyanosis, or edema MS- no significant deformity or atrophy Skin- no rash or lesion Psych- euthymic mood, full affect Neuro- strength and sensation are intact  EKG- av dual paced rhythm at 61 bpm    Assessment and Plan: 1. Afib/flutter/atrial tach Now on flecainide 50 mg bid  and afib is currently quiet She is pending an echo with Kathryn Gibson  for some recent dyspnea, URI vrs HF, seen in clinic  by him this am  She is now back to baseline  Continue metoprolol 75 mg bid    2. CHA2DS2VASc score of at least 5  Continue  xarelto 15 mg daily   3. Hypotension Stable today   F/u with device clinic /Dr. Rayann Heman as scheduled   Geroge Baseman. Tyrena Gohr, [facility] [address] [place], [place] [address] [phone]

## 2020-03-22 ENCOUNTER — Emergency Department (HOSPITAL_COMMUNITY)
Admission: EM | Admit: 2020-03-22 | Discharge: 2020-03-23 | Disposition: A | Discharge status: Home / Self Care | Payer: Medicare Other | Attending: Emergency Medicine | Admitting: Emergency Medicine

## 2020-03-22 ENCOUNTER — Emergency Department (HOSPITAL_COMMUNITY): Payer: Medicare Other

## 2020-03-22 ENCOUNTER — Encounter (HOSPITAL_COMMUNITY): Payer: Self-pay

## 2020-03-22 DIAGNOSIS — Z7901 Long term (current) use of anticoagulants: Secondary | ICD-10-CM | POA: Diagnosis not present

## 2020-03-22 DIAGNOSIS — I10 Essential (primary) hypertension: Secondary | ICD-10-CM | POA: Insufficient documentation

## 2020-03-22 DIAGNOSIS — Z950 Presence of cardiac pacemaker: Secondary | ICD-10-CM | POA: Diagnosis not present

## 2020-03-22 DIAGNOSIS — E038 Other specified hypothyroidism: Secondary | ICD-10-CM | POA: Diagnosis not present

## 2020-03-22 DIAGNOSIS — Z79899 Other long term (current) drug therapy: Secondary | ICD-10-CM | POA: Diagnosis not present

## 2020-03-22 DIAGNOSIS — Z87891 Personal history of nicotine dependence: Secondary | ICD-10-CM | POA: Insufficient documentation

## 2020-03-22 DIAGNOSIS — K5900 Constipation, unspecified: Secondary | ICD-10-CM | POA: Insufficient documentation

## 2020-03-22 DIAGNOSIS — K5909 Other constipation: Secondary | ICD-10-CM | POA: Diagnosis not present

## 2020-03-22 DIAGNOSIS — R109 Unspecified abdominal pain: Secondary | ICD-10-CM | POA: Diagnosis present

## 2020-03-22 DIAGNOSIS — K567 Ileus, unspecified: Secondary | ICD-10-CM | POA: Diagnosis not present

## 2020-03-22 LAB — CBC
HCT: 35.4 % — ABNORMAL LOW (ref 36.0–46.0)
Hemoglobin: 11.8 g/dL — ABNORMAL LOW (ref 12.0–15.0)
MCH: 28.8 pg (ref 26.0–34.0)
MCHC: 33.3 g/dL (ref 30.0–36.0)
MCV: 86.3 fL (ref 80.0–100.0)
Platelets: 342 10*3/uL (ref 150–400)
RBC: 4.1 MIL/uL (ref 3.87–5.11)
RDW: 13.6 % (ref 11.5–15.5)
WBC: 9.1 10*3/uL (ref 4.0–10.5)
nRBC: 0 % (ref 0.0–0.2)

## 2020-03-22 LAB — URINALYSIS, ROUTINE W REFLEX MICROSCOPIC
Bilirubin Urine: NEGATIVE
Glucose, UA: NEGATIVE mg/dL
Hgb urine dipstick: NEGATIVE
Ketones, ur: NEGATIVE mg/dL
Nitrite: NEGATIVE
Protein, ur: NEGATIVE mg/dL
Specific Gravity, Urine: 1.005 (ref 1.005–1.030)
pH: 7 (ref 5.0–8.0)

## 2020-03-22 LAB — COMPREHENSIVE METABOLIC PANEL
ALT: 31 U/L (ref 0–44)
AST: 23 U/L (ref 15–41)
Albumin: 3.4 g/dL — ABNORMAL LOW (ref 3.5–5.0)
Alkaline Phosphatase: 88 U/L (ref 38–126)
Anion gap: 9 (ref 5–15)
BUN: 15 mg/dL (ref 8–23)
CO2: 28 mmol/L (ref 22–32)
Calcium: 8.9 mg/dL (ref 8.9–10.3)
Chloride: 97 mmol/L — ABNORMAL LOW (ref 98–111)
Creatinine, Ser: 0.92 mg/dL (ref 0.44–1.00)
GFR, Estimated: >60 mL/min (ref >60)
Glucose, Bld: 113 mg/dL — ABNORMAL HIGH (ref 70–99)
Potassium: 4 mmol/L (ref 3.5–5.1)
Sodium: 134 mmol/L — ABNORMAL LOW (ref 135–145)
Total Bilirubin: 0.6 mg/dL (ref 0.3–1.2)
Total Protein: 6.2 g/dL — ABNORMAL LOW (ref 6.5–8.1)

## 2020-03-22 LAB — LIPASE, BLOOD: Lipase: 42 U/L (ref 11–51)

## 2020-03-22 MED ORDER — IOHEXOL 300 MG/ML  SOLN
100.0000 mL | Freq: Once | INTRAMUSCULAR | Status: AC | PRN
  Administered 2020-03-22: 100 mL via INTRAVENOUS

## 2020-03-22 NOTE — ED Provider Notes (Signed)
  Provider Note MRN:  [id number]  Arrival date & time: 03/23/20    ED Course and Medical Decision Making  Assumed care from Dr. Tomi Bamberger at shift change.  Abdominal discomfort, liquid bowel movements, XR with ?ileus awaiting CT.  Candidate for dc if CT reassuring.  CT is without emergent process, revealing slow transition state between small and large intestines. Patient continues to look and feel well, largely without complaints at this time, appropriate for follow-up with her gastroenterologist. Patient also made aware of her pancreatic lesion, will follow up with GI for this as well.  Procedures  Final Clinical Impressions(s) / ED Diagnoses     ICD-10-CM   1. Constipation, unspecified constipation type  K59.00     ED Discharge Orders    None        Discharge Instructions     You were evaluated in the Emergency Department and after careful evaluation, we did not find any emergent condition requiring admission or further testing in the hospital.  Your exam/testing today was overall reassuring.  Your symptoms seem to be due to a slow transition between the small and large intestines.  We recommend follow up with your GI doctor for further management.  Please return to the Emergency Department if you experience any worsening of your condition.  Thank you for allowing [place] to be a part of your care.      Barth Kirks. Sedonia Small, [place] [email or web address]    Maudie Flakes, MD 03/23/20 [phone]

## 2020-03-22 NOTE — ED Triage Notes (Signed)
Pt reports that she has not has a BM since last Sun, pt has been taking laxatives, went to UC today and had xray told she has an ileus.

## 2020-03-22 NOTE — ED Provider Notes (Signed)
[facility] EMERGENCY DEPARTMENT Provider Note   CSN: [id number] Arrival date & time: 03/22/20  1855     History Chief Complaint  Patient presents with  . Abdominal Pain    Kathryn Gibson is a 87 y.o. female.  HPI   Patient states she has a history of IBS.  For the last week she has felt but she has not had normal bowel movement.  She has been taking laxatives including magnesium citrate and has been passing liquid but has not noticed any formed stools.  Patient felt like she might be developing an impaction.  She has not had any trouble with abdominal pain she not having any nausea or vomiting.  She went to an urgent care today.  While she was there she had x-rays.  Patient was told that the x-rays showed gas-filled loops of mildly dilated bowel.  Patient was told this could be an ileus versus an obstruction.  Patient was instructed to come to the emergency room for further evaluation.  Past Medical History:  Diagnosis Date  . Arthritis   . Atrial fibrillation ([place])   . Bradycardia    s/p PPM  . Cataract   . Colonic polyp    adenomatous  . Constipation   . Diverticulosis   . Hemorrhoids   . Hypertension   . Hypothyroidism   . Irritable bowel syndrome (IBS)   . Pacemaker 2010    Patient Active Problem List   Diagnosis Date Noted  . Genital herpes simplex 09/18/2019  . Fatigue 06/08/2018  . Chronic rhinitis 12/14/2016  . [place] 12/14/2016  . Rectal pain 11/12/2016  . Ptosis of eyelid 04/05/2016  . A-fib ([place]) 07/01/2015  . Hypokalemia   . PAF (paroxysmal atrial fibrillation) ([place]) 07/16/2014  . Acute blood loss anemia   . Hypomagnesemia   . Chronic atrial fibrillation ([place])   . Essential hypertension   . Other specified hypothyroidism   . Posterior epistaxis   . Hematoma complicating a procedure   . Epistaxis 07/14/2014  . Diastolic dysfunction 35/36/1443  . Epistaxis, recurrent 07/14/2014  . Severe epistaxis 07/14/2014  . Sick sinus  syndrome ([place]) 04/29/2014  . Encounter for therapeutic drug monitoring 04/04/2013  . Sciatica of left side 11/22/2011  . Aortic valve insufficiency 03/25/2011  . Dyspnea 11/24/2010  . Atrial fibrillation ([place]) 05/19/2010  . Essential hypertension, benign 04/18/2010  . Hypothyroidism 04/15/2010  . PALPITATIONS 04/15/2010  . BRADYCARDIA-TACHYCARDIA SYNDROME 12/11/2008  . IRRITABLE BOWEL SYNDROME 12/11/2008  . COLONIC POLYPS, ADENOMATOUS, HX OF 12/11/2008  . CONSTIPATION 11/13/2008  . ABDOMINAL BLOATING 11/13/2008  . COAGULOPATHY 07/03/2008  . Constipation 07/03/2008  . CHANGE IN BOWELS 07/03/2008  . UNSPECIFIED DISORDER OF THYROID 07/02/2008  . DIVERTICULOSIS, COLON 07/02/2008    Past Surgical History:  Procedure Laterality Date  . BREAST CYST ASPIRATION Left 1980s  . COLONOSCOPY    . ELECTROPHYSIOLOGIC STUDY N/A 07/01/2015   Procedure: Atrial Fibrillation Ablation;  Surgeon: Thompson Grayer, MD;  Location: [place] [facility];  Service: Cardiovascular;  Laterality: N/A;  . EYE SURGERY    . HEMATOMA EVACUATION Right 07/15/2014   Procedure: EVACUATION Right Groin Hematoma;  Surgeon: Rosetta Posner, MD;  Location: [facility];  Service: Vascular;  Laterality: Right;  . NASAL ENDOSCOPY WITH EPISTAXIS CONTROL N/A 07/14/2014   Procedure: NASAL ENDOSCOPY WITH EPISTAXIS CONTROL;  Surgeon: Ruby Cola, MD;  Location: [facility];  Service: ENT;  Laterality: N/A;  . NASAL ENDOSCOPY WITH EPISTAXIS CONTROL Bilateral 07/15/2014   Procedure:  NASAL ENDOSCOPY WITH EPISTAXIS CONTROL;  Surgeon: Ruby Cola, MD;  Location: [facility];  Service: ENT;  Laterality: Bilateral;  . PACEMAKER INSERTION  2010   implanted by Dr Doreatha Lew (MDT)  . PPM GENERATOR CHANGEOUT N/A 08/16/2019   Procedure: PPM GENERATOR CHANGEOUT;  Surgeon: Thompson Grayer, MD;  Location: [facility];  Service: Cardiovascular;  Laterality: N/A;  . RADIOLOGY WITH ANESTHESIA N/A 07/14/2014   Procedure: RADIOLOGY WITH ANESTHESIA;  Surgeon: Medication  Radiologist, MD;  Location: [place];  Service: Radiology;  Laterality: N/A;  . TEE WITHOUT CARDIOVERSION N/A 07/01/2015   Procedure: TRANSESOPHAGEAL ECHOCARDIOGRAM (TEE);  Surgeon: Sueanne Margarita, MD;  Location: [facility] ENDOSCOPY;  Service: Cardiovascular;  Laterality: N/A;     OB History   No obstetric history on file.     Family History  Problem Relation Age of Onset  . Hypertension Mother   . Congestive Heart Failure Mother   . Dementia Mother   . Heart attack Father 67       MI cause of death  . Diabetes Paternal Grandfather   . CAD Brother        2 stents  . Lung cancer Son   . Leukemia Daughter   . Colon cancer Neg Hx   . Kidney disease Neg Hx   . Liver disease Neg Hx   . Esophageal cancer Neg Hx   . Stroke Neg Hx     Social History   Tobacco Use  . Smoking status: Former Smoker    Types: Cigarettes    Quit date: 02/09/1980    Years since quitting: 40.1  . Smokeless tobacco: Never Used  Vaping Use  . Vaping Use: Never used  Substance Use Topics  . Alcohol use: Yes    Alcohol/week: 14.0 standard drinks    Types: 14 Glasses of wine per week    Comment: wine 6 days a week  . Drug use: No    Home Medications Prior to Admission medications   Medication Sig Start Date End Date Taking? Authorizing Provider  acetaminophen (TYLENOL) 500 MG tablet Take 500-1,000 mg by mouth every 6 (six) hours as needed for moderate pain or headache.    [provider]  Artificial Tear Ointment (DRY EYES OP) Place 1 drop into both eyes daily as needed (Dry eye).    [provider]  Ascorbic Acid (VITAMIN C) 1000 MG tablet Take 1,000 mg by mouth daily.      [provider]  Calcium-Vitamin D (CALTRATE 600 PLUS-VIT D PO) Take 1 tablet by mouth daily.    [provider]  cholecalciferol (VITAMIN D) 1000 UNITS tablet Take 1,000 Units by mouth daily.      [provider]  Coenzyme Q10 (CO Q-10 PO) Take 1 tablet by mouth daily.    [provider]  COLLAGEN PO Take 2 Scoops by mouth daily.    [provider]  cromolyn (OPTICROM) 4 % ophthalmic solution 1 drop 4 (four) times daily. 10/17/19   [provider]  diltiazem (CARDIZEM CD) 360 MG 24 hr capsule Take 1 capsule (360 mg total) by mouth daily. 02/27/20   Shirley Friar, PA-C  diltiazem (CARDIZEM) 30 MG tablet Take 1/2 to 1 tablet every 4 hours AS NEEDED for heart rate >100 as long as top blood pressure >100. 11/02/19   Sherran Needs, NP  flecainide (TAMBOCOR) 50 MG tablet Take 1 tablet (50 mg total) by mouth 2 (two) times daily. 02/27/20   Tillery,  Satira Mccallum, PA-C  levothyroxine (SYNTHROID) 50 MCG tablet Take 50 mcg by mouth once a week.    [provider]  levothyroxine (SYNTHROID) 75 MCG tablet Take 75 mcg by mouth daily before breakfast. 6 days a week    [provider]  Lysine 1000 MG TABS Take 1,000 mg by mouth as needed.    [provider]  methylcellulose (CITRUCEL) oral powder Take 2 packets by mouth daily.    [provider]  metoprolol succinate (TOPROL XL) 50 MG 24 hr tablet Take 75mg  TWICE A DAY 02/27/20   Shirley Friar, PA-C  Multiple Vitamins-Minerals (ICAPS AREDS 2 PO) Take 1 capsule by mouth daily.     [provider]  Omega-3 Fatty Acids (FISH OIL) 1200 MG CAPS Take 1,200 mg by mouth daily. Mega Red.    [provider]  polyethylene glycol powder (GLYCOLAX/MIRALAX) powder Take 0.5 Containers by mouth at bedtime.     [provider]  pravastatin (PRAVACHOL) 10 MG tablet Take 1 tablet by mouth once daily 01/28/20   Allred, Jeneen Rinks, MD  Probiotic Product (ALIGN PO) Take 1 capsule by mouth daily.    [provider]  sodium chloride (OCEAN) 0.65 % SOLN nasal spray Place 1 spray into both nostrils daily.    [provider]  temazepam (RESTORIL) 30 MG capsule Take 30 mg by mouth at bedtime.    [provider]  XARELTO 15 MG TABS tablet TAKE 1 TABLET  BY MOUTH ONCE DAILY WITH SUPPER 01/07/20   [name], MD  Zinc 50 MG CAPS Take 50 mg by mouth daily.  01/22/19   [provider]    Allergies    Trazodone and nefazodone  Review of Systems   Review of Systems  All other systems reviewed and are negative.   Physical Exam Updated Vital Signs BP (!) 134/57 (BP Location: Left Arm)   Pulse 61   Temp 98.5 F (36.9 C)   Resp 18   SpO2 100%   Physical Exam Vitals and nursing note reviewed.  Constitutional:      Appearance: She is well-developed and well-nourished. She is not toxic-appearing or diaphoretic.  HENT:     Head: Normocephalic and atraumatic.     Right Ear: External ear normal.     Left Ear: External ear normal.  Eyes:     General: No scleral icterus.       Right eye: No discharge.        Left eye: No discharge.     Conjunctiva/sclera: Conjunctivae normal.  Neck:     Trachea: No tracheal deviation.  Cardiovascular:     Rate and Rhythm: Normal rate and regular rhythm.     Pulses: Intact distal pulses.  Pulmonary:     Effort: Pulmonary effort is normal. No respiratory distress.     Breath sounds: Normal breath sounds. No stridor. No wheezing or rales.  Abdominal:     General: Bowel sounds are normal. There is no distension.     Palpations: Abdomen is soft.     Tenderness: There is no abdominal tenderness. There is no guarding or rebound.  Musculoskeletal:        General: No tenderness or edema.     Cervical back: Neck supple.  Skin:    General: Skin is warm and dry.     Findings: No rash.  Neurological:     Mental Status: She is alert.     Cranial Nerves: No cranial nerve deficit (no  facial droop, extraocular movements intact, no slurred speech).     Sensory: No sensory deficit.     Motor: No abnormal muscle tone or seizure activity.     Coordination: Coordination normal.     Deep Tendon Reflexes: Strength normal.  Psychiatric:        Mood and Affect: Mood and affect normal.     ED  Results / Procedures / Treatments   Labs (all labs ordered are listed, but only abnormal results are displayed) Labs Reviewed  COMPREHENSIVE METABOLIC PANEL - Abnormal; Notable for the following components:      Result Value   Sodium 134 (*)    Chloride 97 (*)    Glucose, Bld 113 (*)    Total Protein 6.2 (*)    Albumin 3.4 (*)    All other components within normal limits  CBC - Abnormal; Notable for the following components:   Hemoglobin 11.8 (*)    HCT 35.4 (*)    All other components within normal limits  URINALYSIS, ROUTINE W REFLEX MICROSCOPIC - Abnormal; Notable for the following components:   Color, Urine STRAW (*)    Leukocytes,Ua LARGE (*)    Bacteria, UA RARE (*)    All other components within normal limits  LIPASE, BLOOD     Procedures Procedures   Medications Ordered in ED Medications - No data to display  ED Course  I have reviewed the triage vital signs and the nursing notes.  Pertinent labs & imaging results that were available during my care of the patient were reviewed by me and considered in my medical decision making (see chart for details).    MDM Rules/Calculators/A&P                         Patient's initial laboratory tests are unremarkable.  She does not have a white count.  Hemoglobin stable.  No significant electrolyte abnormalities.  Urinalysis does show large leukocyte esterase and 11-20 white blood cells with rare bacteria.  Does suggest possibility of infection although patient's not finding a lot of urinary symptoms.  Imaging test results reviewed from the urgent care appointment.  We will proceed with CT scan to evaluate for obstruction.  Case turned over to Dr Sedonia Small. Final Clinical Impression(s) / ED Diagnoses pending   Dorie Rank, MD 03/22/20 2308

## 2020-03-23 NOTE — Discharge Instructions (Addendum)
You were evaluated in the Emergency Department and after careful evaluation, we did not find any emergent condition requiring admission or further testing in the hospital.  Your exam/testing today was overall reassuring.  Your symptoms seem to be due to a slow transition between the small and large intestines.  We recommend follow up with your GI doctor for further management.  Please return to the Emergency Department if you experience any worsening of your condition.  Thank you for allowing [place] to be a part of your care.

## 2020-03-24 ENCOUNTER — Telehealth: Payer: Self-pay | Admitting: Internal Medicine

## 2020-03-24 LAB — URINE CULTURE: Culture: <10000 — AB

## 2020-03-24 NOTE — Telephone Encounter (Signed)
Spoke with patient , she states that she has not had a BM since 03/16/20, she states that she has been taking 2 capfuls of Miralax and 6 Dulcolax tabs a day, pt states that she has also tried hot tea, 10 oz of magnesium citrate twice the other day with no relief. Patient states that she was told that she needs a procedure, advised that she will need to be evaluated in the office and the provider will advise on their recommendations. Pt is aware that Dr. Henrene Pastor is out of the office today, she has been scheduled for the next available appt. She is scheduled to see Anderson Malta on Tuesday, 03/25/20 at 3 PM. Patient has been advised to remain on a liquid diet, and to try a Miralax purge - instructed patient how to complete this. Advised patient that if she develops any severe abdominal pain or is unable to tolerate anything PO she will need to go to the emergency room for evaluation. Patient was very concerned that the recommended procedure needed to be done urgently, advised that if procedure was emergent it would have been completed in the hospital. Patient verbalized understanding and had no further concerns at the end of the call.

## 2020-03-24 NOTE — Telephone Encounter (Signed)
See telephone encounter 03/24/20 for more information.

## 2020-03-24 NOTE — Telephone Encounter (Signed)
Pt is requesting a call back from a nurse to discuss her hospital follow up. Pt is experiencing constipation and hasn't been able to go to the bathroom in a week.

## 2020-03-25 ENCOUNTER — Other Ambulatory Visit: Payer: Self-pay

## 2020-03-25 ENCOUNTER — Ambulatory Visit (INDEPENDENT_AMBULATORY_CARE_PROVIDER_SITE_OTHER)
Admission: RE | Admit: 2020-03-25 | Discharge: 2020-03-25 | Disposition: A | Discharge status: Home / Self Care | Payer: Medicare Other | Source: Ambulatory Visit | Attending: Physician Assistant | Admitting: Physician Assistant

## 2020-03-25 ENCOUNTER — Encounter: Payer: Self-pay | Admitting: Physician Assistant

## 2020-03-25 ENCOUNTER — Telehealth: Payer: Self-pay

## 2020-03-25 ENCOUNTER — Ambulatory Visit: Payer: Medicare Other | Admitting: Physician Assistant

## 2020-03-25 VITALS — BP 124/60 | HR 72 | Ht 60.5 in | Wt 129.0 lb

## 2020-03-25 DIAGNOSIS — K5900 Constipation, unspecified: Secondary | ICD-10-CM | POA: Diagnosis not present

## 2020-03-25 DIAGNOSIS — K219 Gastro-esophageal reflux disease without esophagitis: Secondary | ICD-10-CM

## 2020-03-25 DIAGNOSIS — R109 Unspecified abdominal pain: Secondary | ICD-10-CM | POA: Diagnosis not present

## 2020-03-25 NOTE — Patient Instructions (Addendum)
If you are age 85 or older, your body mass index should be between 23-30. Your Body mass index is 24.78 kg/m. If this is out of the aforementioned range listed, please consider follow up with your Primary Care Provider.  If you are age 92 or younger, your body mass index should be between 19-25. Your Body mass index is 24.78 kg/m. If this is out of the aformentioned range listed, please consider follow up with your Primary Care Provider.   Your provider has requested that you have an abdominal x ray before leaving today. Please go to the basement floor to our Radiology department for the test.  Please start Pepcid over the counter 20 mg once daily.   Thank you for choosing me and [facility].  Ellouise Newer, PA-C

## 2020-03-25 NOTE — Telephone Encounter (Signed)
-----   Message from Irene Shipper, MD sent at 03/24/2020  4:23 PM EST ----- Regarding: RE: Follow up Kathryn Gibson, Get this patient follow up with GI APP. Thanks, JP ----- Message ----- From: Maudie Flakes, MD Sent: 03/23/2020  12:46 AM EST To: Irene Shipper, MD Subject: Follow up                                      Dr. Henrene Pastor,  Patient of yours here in the emergency department evening of 2-12 with abdominal distention, constipation, sent here from urgent care after outpatient x-ray suggesting possible obstruction.  Overall doing well on our assessment, CT scan revealing slow transition state between small and large intestine. CT also revealing possible pancreatic pseudocyst. No indication for further testing or admission, thought I would message you to make sure she gets prompt follow-up. Thank you for your help.  Barth Kirks. [place] Small, [place] [email or web address]

## 2020-03-25 NOTE — Progress Notes (Signed)
Assessment and plans noted ?

## 2020-03-25 NOTE — Progress Notes (Signed)
Chief Complaint: Follow-up constipation  HPI:    Kathryn Gibson is an 87 year old female with a past medical history as listed below, known to Dr. Henrene Pastor, who was referred to me by Prince Solian, MD for a complaint of constipation.      04/11/2019 patient seen in clinic by Dr. Henrene Pastor and at that time it was described she had a history of constipation predominant IBS and adenomatous colon polyps last evaluated June 2019.  Last colonoscopy 2013.  She reported 2-year history of fecal incontinence.  It was thought her fecal incontinence is secondary to poor rectal tone.  Recommend she is Citrucel 2 tablespoons daily.    03/22/2020 abdominal x-ray with gas-filled loops of mildly dilated bowel throughout the abdomen, favored represent ileus, although early distal large bowel obstruction was too difficult to exclude.  Mild stool burden and small right pleural effusion.  She was sent to the ER.    03/22/2020 patient seen in the ER for constipation.  Labs showed a sodium of 134, CBC with hemoglobin of 11.8, normal lipase.  CT of the abdomen pelvis with contrast showed fast transition state of the large bowel, fecalized material within the distal small bowel suggestive of an incomplete ileocecal valve or slow transition state from the small to large bowel, no findings of bowel obstruction or perforation.  Bilateral trace pleural effusions and interval increase in size and development of hypodense pancreatic lesions that could represent pseudocyst versus intraductal papillary mucinous neoplasms.  Is recommended she have reimaging in 1 year with MRI/MRCP with pancreatic protocol.    03/24/2020 patient called and expressed that she was constipated not been able to go to the bathroom in a week.  She had been taking 2 capsules of MiraLAX and 6 Dulcolax tabs a day and also tried hot tea, 10 ounces of mag citrate twice with no relief.  Was recommended she try MiraLAX purge and remain on a liquid diet.    Today, the patient  tells me that she did not have a bowel movement from 03/16/2020 through 03/22/2020, initially presented to the urgent care was told she had an ileus and told to go to the ER.  Had a CT and labs as above there which were reassuring.  Patient tells me that she was using all the laxatives she needed to use in the world including mag citrate etc. but nothing was working.  Most recently she did a MiraLAX bowel purge overnight and did produce some liquid stool with "sediment in it".  Tells me that she still feels very bloated and has developed a worsening of some indigestion symptoms over the past few days.  Tells me she chronically uses MiraLAX 2 capfuls at night and Citrucel which is helped with her fecal incontinence.    Does tell me that she has had some indigestion here and there which is new for her over the past year, apparently has an appointment with Dr. Henrene Pastor in March to discuss this further.    Denies fever, chills, nausea, vomiting, blood in her stool or weight loss.  Past Medical History:  Diagnosis Date  . Arthritis   . Atrial fibrillation ([place])   . Bradycardia    s/p PPM  . Cataract   . Colonic polyp    adenomatous  . Constipation   . Diverticulosis   . Hemorrhoids   . Hypertension   . Hypothyroidism   . Irritable bowel syndrome (IBS)   . Pacemaker 2010    Past Surgical History:  Procedure Laterality Date  . BREAST CYST ASPIRATION Left 1980s  . COLONOSCOPY    . ELECTROPHYSIOLOGIC STUDY N/A 07/01/2015   Procedure: Atrial Fibrillation Ablation;  Surgeon: Thompson Grayer, MD;  Location: [facility];  Service: Cardiovascular;  Laterality: N/A;  . EYE SURGERY    . HEMATOMA EVACUATION Right 07/15/2014   Procedure: EVACUATION Right Groin Hematoma;  Surgeon: Rosetta Posner, MD;  Location: [facility];  Service: Vascular;  Laterality: Right;  . NASAL ENDOSCOPY WITH EPISTAXIS CONTROL N/A 07/14/2014   Procedure: NASAL ENDOSCOPY WITH EPISTAXIS CONTROL;  Surgeon: Ruby Cola, MD;  Location: [facility];   Service: ENT;  Laterality: N/A;  . NASAL ENDOSCOPY WITH EPISTAXIS CONTROL Bilateral 07/15/2014   Procedure: NASAL ENDOSCOPY WITH EPISTAXIS CONTROL;  Surgeon: Ruby Cola, MD;  Location: [facility];  Service: ENT;  Laterality: Bilateral;  . PACEMAKER INSERTION  2010   implanted by Dr Doreatha Lew ([facility])  . PPM GENERATOR CHANGEOUT N/A 08/16/2019   Procedure: PPM GENERATOR CHANGEOUT;  Surgeon: Thompson Grayer, MD;  Location: [facility];  Service: Cardiovascular;  Laterality: N/A;  . RADIOLOGY WITH ANESTHESIA N/A 07/14/2014   Procedure: RADIOLOGY WITH ANESTHESIA;  Surgeon: Medication Radiologist, MD;  Location: [facility];  Service: Radiology;  Laterality: N/A;  . TEE WITHOUT CARDIOVERSION N/A 07/01/2015   Procedure: TRANSESOPHAGEAL ECHOCARDIOGRAM (TEE);  Surgeon: Sueanne Margarita, MD;  Location: [facility];  Service: Cardiovascular;  Laterality: N/A;    Current Outpatient Medications  Medication Sig Dispense Refill  . acetaminophen (TYLENOL) 500 MG tablet Take 500-1,000 mg by mouth every 6 (six) hours as needed for moderate pain or headache.    . Artificial Tear Ointment (DRY EYES OP) Place 1 drop into both eyes daily as needed (Dry eye).    . Ascorbic Acid (VITAMIN C) 1000 MG tablet Take 1,000 mg by mouth daily.      . Calcium-Vitamin D (CALTRATE 600 PLUS-VIT D PO) Take 1 tablet by mouth daily.    . cholecalciferol (VITAMIN D) 1000 UNITS tablet Take 1,000 Units by mouth daily.      . Coenzyme Q10 (CO Q-10 PO) Take 1 tablet by mouth daily.    . COLLAGEN PO Take 2 Scoops by mouth daily.    . cromolyn (OPTICROM) 4 % ophthalmic solution 1 drop 4 (four) times daily.    Marland Kitchen diltiazem (CARDIZEM CD) 360 MG 24 hr capsule Take 1 capsule (360 mg total) by mouth daily. 90 capsule 3  . diltiazem (CARDIZEM) 30 MG tablet Take 1/2 to 1 tablet every 4 hours AS NEEDED for heart rate >100 as long as top blood pressure >100. 45 tablet 1  . flecainide (TAMBOCOR) 50 MG tablet Take 1 tablet (50 mg total) by mouth 2 (two) times  daily. 60 tablet 2  . levothyroxine (SYNTHROID) 50 MCG tablet Take 50 mcg by mouth once a week.    . levothyroxine (SYNTHROID) 75 MCG tablet Take 75 mcg by mouth daily before breakfast. 6 days a week    . Lysine 1000 MG TABS Take 1,000 mg by mouth as needed.    . methylcellulose (CITRUCEL) oral powder Take 2 packets by mouth daily.    . metoprolol succinate (TOPROL XL) 50 MG 24 hr tablet Take 75mg  TWICE A DAY 270 tablet 3  . Multiple Vitamins-Minerals (ICAPS AREDS 2 PO) Take 1 capsule by mouth daily.     . Omega-3 Fatty Acids (FISH OIL) 1200 MG CAPS Take 1,200 mg by mouth daily. Mega Red.    . polyethylene  glycol powder (GLYCOLAX/MIRALAX) powder Take 0.5 Containers by mouth at bedtime.   0  . pravastatin (PRAVACHOL) 10 MG tablet Take 1 tablet by mouth once daily 90 tablet 0  . Probiotic Product (ALIGN PO) Take 1 capsule by mouth daily.    . sodium chloride (OCEAN) 0.65 % SOLN nasal spray Place 1 spray into both nostrils daily.    . temazepam (RESTORIL) 30 MG capsule Take 30 mg by mouth at bedtime.    Alveda Reasons 15 MG TABS tablet TAKE 1 TABLET BY MOUTH ONCE DAILY WITH SUPPER 90 tablet 1  . Zinc 50 MG CAPS Take 50 mg by mouth daily.      No current facility-administered medications for this visit.    Allergies as of 03/25/2020 - Review Complete 03/22/2020  Allergen Reaction Noted  . Trazodone and nefazodone  06/13/2018    Family History  Problem Relation Age of Onset  . Hypertension Mother   . Congestive Heart Failure Mother   . Dementia Mother   . Heart attack Father 41       MI cause of death  . Diabetes Paternal Grandfather   . CAD Brother        2 stents  . Lung cancer Son   . Leukemia Daughter   . Colon cancer Neg Hx   . Kidney disease Neg Hx   . Liver disease Neg Hx   . Esophageal cancer Neg Hx   . Stroke Neg Hx     Social History   Socioeconomic History  . Marital status: Widowed    Spouse name: Not on file  . Number of children: 3  . Years of education: Not on  file  . Highest education level: Not on file  Occupational History  . Occupation: retired  Tobacco Use  . Smoking status: Former Smoker    Types: Cigarettes    Quit date: 02/09/1980    Years since quitting: 40.1  . Smokeless tobacco: Never Used  Vaping Use  . Vaping Use: Never used  Substance and Sexual Activity  . Alcohol use: Yes    Alcohol/week: 14.0 standard drinks    Types: 14 Glasses of wine per week    Comment: wine 6 days a week  . Drug use: No  . Sexual activity: Not on file  Other Topics Concern  . Not on file  Social History Narrative  . Not on file   Social Determinants of Health   Financial Resource Strain: Not on file  Food Insecurity: Not on file  Transportation Needs: Not on file  Physical Activity: Not on file  Stress: Not on file  Social Connections: Not on file  Intimate Partner Violence: Not on file    Review of Systems:    Constitutional: No weight loss, fever or chills Cardiovascular: No chest pain Respiratory: No SOB  Gastrointestinal: See HPI and otherwise negative   Physical Exam:  Vital signs: BP 124/60   Pulse 72   Ht 5' 0.5" (1.537 m)   Wt 129 lb (58.5 kg)   BMI 24.78 kg/m   Constitutional:   Pleasant Elderly Caucasian female appears to be in NAD, Well developed, Well nourished, alert and cooperative Respiratory: Respirations even and unlabored. Lungs clear to auscultation bilaterally.   No wheezes, crackles, or rhonchi.  Cardiovascular: Normal S1, S2. No MRG. Regular rate and rhythm. No peripheral edema, cyanosis or pallor.  Gastrointestinal:  Soft, nondistended, nontender. No rebound or guarding. Normal bowel sounds. No appreciable masses or hepatomegaly. Rectal:  Not performed.  Psychiatric: Oriented to person, place and time. Demonstrates good judgement and reason without abnormal affect or behaviors.  RELEVANT LABS AND IMAGING: CBC    Component Value Date/Time   WBC 9.1 03/22/2020 2019   RBC 4.10 03/22/2020 2019   HGB 11.8  (L) 03/22/2020 2019   HGB 12.1 02/27/2020 1243   HCT 35.4 (L) 03/22/2020 2019   HCT 36.3 02/27/2020 1243   PLT 342 03/22/2020 2019   PLT 244 02/27/2020 1243   MCV 86.3 03/22/2020 2019   MCV 87 02/27/2020 1243   MCH 28.8 03/22/2020 2019   MCHC 33.3 03/22/2020 2019   RDW 13.6 03/22/2020 2019   RDW 12.6 02/27/2020 1243   LYMPHSABS 2.1 02/27/2020 1243   MONOABS 1.9 (H) 07/19/2014 0245   EOSABS 0.1 02/27/2020 1243   BASOSABS 0.0 02/27/2020 1243    CMP     Component Value Date/Time   NA 134 (L) 03/22/2020 2019   NA 140 02/27/2020 1243   K 4.0 03/22/2020 2019   CL 97 (L) 03/22/2020 2019   CO2 28 03/22/2020 2019   GLUCOSE 113 (H) 03/22/2020 2019   BUN 15 03/22/2020 2019   BUN 16 02/27/2020 1243   CREATININE 0.92 03/22/2020 2019   CALCIUM 8.9 03/22/2020 2019   PROT 6.2 (L) 03/22/2020 2019   ALBUMIN 3.4 (L) 03/22/2020 2019   AST 23 03/22/2020 2019   ALT 31 03/22/2020 2019   ALKPHOS 88 03/22/2020 2019   BILITOT 0.6 03/22/2020 2019   GFRNONAA >60 03/22/2020 2019   GFRAA 58 (L) 02/27/2020 1243    Assessment: 1.  Constipation: Patient is chronically constipated for which she uses MiraLAX 2 capfuls at night as well as fiber, typically this helps her but for the past week was constipated to the point where she is not passing any stool, had an x-ray at [facility] which showed possible ileus versus large bowel obstruction, went to the ER and had CT which was reassuring just showing a slow transition, since then has successfully completed a MiraLAX bowel purge with some liquid stool results, still feels bloated and uncomfortable 2.  GERD: Infrequent indigestion worsened over the past couple of days; consider mild gastritis versus diet  Plan: 1.  Repeat abdominal x-ray two-view today. 2.  Would recommend the patient use Pepcid 20 mg over-the-counter every morning for her infrequent reflux symptoms.  Discussed that she can use this daily to prevent symptoms or as needed when symptoms  exist. 3.  Patient does still have a follow-up with Dr. Henrene Pastor March 23, told her to keep this for now, if symptoms resolve and her reflux is better with Pepcid then she can call to cancel.  Kathryn Newer, PA-C [facility] 03/25/2020, 3:01 PM  Cc: Prince Solian, MD

## 2020-03-25 NOTE — Telephone Encounter (Signed)
Pt already scheduled to see Ellouise Newer PA today 03/25/20.

## 2020-04-03 ENCOUNTER — Telehealth: Payer: Self-pay

## 2020-04-03 NOTE — Telephone Encounter (Signed)
-----   Message from Yevette Edwards, RN sent at 03/27/2020  1:26 PM EST ----- Regarding: Update Symptom update, see 03/25/20 xray result note for more information

## 2020-04-03 NOTE — Telephone Encounter (Signed)
Lm on vm for patient to return call 

## 2020-04-03 NOTE — Telephone Encounter (Signed)
Spoke with patient, she states that she is doing better, she has found something that helps her keep her bowels moving. She states that she has been taking 1 Natural turkish fig daily with no issues. Patient thanked me for the follow up call, she is aware that she can call us if she has any further issues.

## 2020-04-11 ENCOUNTER — Ambulatory Visit (HOSPITAL_COMMUNITY): Payer: Medicare Other | Attending: Cardiology

## 2020-04-11 ENCOUNTER — Other Ambulatory Visit: Payer: Self-pay

## 2020-04-11 DIAGNOSIS — R0600 Dyspnea, unspecified: Secondary | ICD-10-CM | POA: Diagnosis not present

## 2020-04-11 DIAGNOSIS — R0609 Other forms of dyspnea: Secondary | ICD-10-CM

## 2020-04-11 LAB — ECHOCARDIOGRAM COMPLETE
Area-P 1/2: 4.8 cm2
MV M vel: 5.08 m/s
MV Peak grad: 103.2 mmHg
P 1/2 time: 345 msec
Radius: 0.5 cm
S' Lateral: 2.6 cm

## 2020-04-11 NOTE — Progress Notes (Signed)
  Echocardiogram 2D Echocardiogram with 3D has been performed.  Burt Knack, Jonelle Sidle M

## 2020-04-15 NOTE — Telephone Encounter (Signed)
Called pt and relayed Jesse's message. Pt verbalized understanding. Expresses thanks

## 2020-04-21 DIAGNOSIS — H16213 Exposure keratoconjunctivitis, bilateral: Secondary | ICD-10-CM | POA: Diagnosis not present

## 2020-04-21 DIAGNOSIS — H04123 Dry eye syndrome of bilateral lacrimal glands: Secondary | ICD-10-CM | POA: Diagnosis not present

## 2020-04-21 DIAGNOSIS — H0288A Meibomian gland dysfunction right eye, upper and lower eyelids: Secondary | ICD-10-CM | POA: Diagnosis not present

## 2020-04-21 DIAGNOSIS — H16143 Punctate keratitis, bilateral: Secondary | ICD-10-CM | POA: Diagnosis not present

## 2020-04-24 ENCOUNTER — Other Ambulatory Visit: Payer: Self-pay | Admitting: Internal Medicine

## 2020-04-28 DIAGNOSIS — E039 Hypothyroidism, unspecified: Secondary | ICD-10-CM | POA: Diagnosis not present

## 2020-04-28 DIAGNOSIS — E785 Hyperlipidemia, unspecified: Secondary | ICD-10-CM | POA: Diagnosis not present

## 2020-04-28 DIAGNOSIS — M859 Disorder of bone density and structure, unspecified: Secondary | ICD-10-CM | POA: Diagnosis not present

## 2020-04-28 DIAGNOSIS — M9903 Segmental and somatic dysfunction of lumbar region: Secondary | ICD-10-CM | POA: Diagnosis not present

## 2020-04-28 DIAGNOSIS — M5116 Intervertebral disc disorders with radiculopathy, lumbar region: Secondary | ICD-10-CM | POA: Diagnosis not present

## 2020-04-29 DIAGNOSIS — I480 Paroxysmal atrial fibrillation: Secondary | ICD-10-CM | POA: Diagnosis not present

## 2020-04-29 DIAGNOSIS — M545 Low back pain, unspecified: Secondary | ICD-10-CM | POA: Diagnosis not present

## 2020-04-29 DIAGNOSIS — R944 Abnormal results of kidney function studies: Secondary | ICD-10-CM | POA: Diagnosis not present

## 2020-04-29 DIAGNOSIS — I10 Essential (primary) hypertension: Secondary | ICD-10-CM | POA: Diagnosis not present

## 2020-04-30 ENCOUNTER — Ambulatory Visit: Payer: Medicare Other | Admitting: Internal Medicine

## 2020-04-30 DIAGNOSIS — M5116 Intervertebral disc disorders with radiculopathy, lumbar region: Secondary | ICD-10-CM | POA: Diagnosis not present

## 2020-04-30 DIAGNOSIS — M9903 Segmental and somatic dysfunction of lumbar region: Secondary | ICD-10-CM | POA: Diagnosis not present

## 2020-05-01 DIAGNOSIS — M9903 Segmental and somatic dysfunction of lumbar region: Secondary | ICD-10-CM | POA: Diagnosis not present

## 2020-05-01 DIAGNOSIS — M5116 Intervertebral disc disorders with radiculopathy, lumbar region: Secondary | ICD-10-CM | POA: Diagnosis not present

## 2020-05-02 DIAGNOSIS — M5459 Other low back pain: Secondary | ICD-10-CM | POA: Diagnosis not present

## 2020-05-05 DIAGNOSIS — K921 Melena: Secondary | ICD-10-CM | POA: Diagnosis not present

## 2020-05-05 DIAGNOSIS — I10 Essential (primary) hypertension: Secondary | ICD-10-CM | POA: Diagnosis not present

## 2020-05-05 DIAGNOSIS — I2510 Atherosclerotic heart disease of native coronary artery without angina pectoris: Secondary | ICD-10-CM | POA: Diagnosis not present

## 2020-05-05 DIAGNOSIS — Z0000 Encounter for general adult medical examination without abnormal findings: Secondary | ICD-10-CM | POA: Diagnosis not present

## 2020-05-05 DIAGNOSIS — M545 Low back pain, unspecified: Secondary | ICD-10-CM | POA: Diagnosis not present

## 2020-05-06 DIAGNOSIS — K5900 Constipation, unspecified: Secondary | ICD-10-CM

## 2020-05-06 NOTE — Telephone Encounter (Signed)
Spoke with patient, she states that she recently had a x-ray that still showed gas. Pt states that she doesn't have any solid stools, small amount light colored water, bloating and passing some Gas. Pt states that she is still taking 2 capfuls a night of Miralax and Citrucel daily. Patient states that it took a while for the Miralax purge to work. Pt is very concerned, offered her an appt on Friday as that is the next available appt but she has an ortho appt that day that she can't miss. Pt states that she is miserable. Please advise, thanks

## 2020-05-07 ENCOUNTER — Other Ambulatory Visit: Payer: Self-pay

## 2020-05-07 ENCOUNTER — Ambulatory Visit (INDEPENDENT_AMBULATORY_CARE_PROVIDER_SITE_OTHER)
Admission: RE | Admit: 2020-05-07 | Discharge: 2020-05-07 | Disposition: A | Discharge status: Home / Self Care | Payer: Medicare Other | Source: Ambulatory Visit | Attending: Physician Assistant | Admitting: Physician Assistant

## 2020-05-07 DIAGNOSIS — Z8719 Personal history of other diseases of the digestive system: Secondary | ICD-10-CM | POA: Diagnosis not present

## 2020-05-07 DIAGNOSIS — K5900 Constipation, unspecified: Secondary | ICD-10-CM

## 2020-05-07 DIAGNOSIS — R140 Abdominal distension (gaseous): Secondary | ICD-10-CM | POA: Diagnosis not present

## 2020-05-07 NOTE — Telephone Encounter (Signed)
Order for x-ray in epic. Patient notified via My Chart.

## 2020-05-08 NOTE — Telephone Encounter (Signed)
Spoke with patient, see 05/07/20 x-ray result note for more information.

## 2020-05-09 ENCOUNTER — Emergency Department (HOSPITAL_BASED_OUTPATIENT_CLINIC_OR_DEPARTMENT_OTHER): Payer: Medicare Other

## 2020-05-09 ENCOUNTER — Other Ambulatory Visit: Payer: Self-pay

## 2020-05-09 ENCOUNTER — Emergency Department (HOSPITAL_BASED_OUTPATIENT_CLINIC_OR_DEPARTMENT_OTHER)
Admission: EM | Admit: 2020-05-09 | Discharge: 2020-05-09 | Disposition: A | Discharge status: Home / Self Care | Payer: Medicare Other | Attending: Emergency Medicine | Admitting: Emergency Medicine

## 2020-05-09 ENCOUNTER — Encounter (HOSPITAL_BASED_OUTPATIENT_CLINIC_OR_DEPARTMENT_OTHER): Payer: Self-pay | Admitting: Emergency Medicine

## 2020-05-09 DIAGNOSIS — Z7901 Long term (current) use of anticoagulants: Secondary | ICD-10-CM | POA: Diagnosis not present

## 2020-05-09 DIAGNOSIS — E871 Hypo-osmolality and hyponatremia: Secondary | ICD-10-CM

## 2020-05-09 DIAGNOSIS — I10 Essential (primary) hypertension: Secondary | ICD-10-CM | POA: Insufficient documentation

## 2020-05-09 DIAGNOSIS — Z950 Presence of cardiac pacemaker: Secondary | ICD-10-CM | POA: Diagnosis not present

## 2020-05-09 DIAGNOSIS — R300 Dysuria: Secondary | ICD-10-CM | POA: Diagnosis not present

## 2020-05-09 DIAGNOSIS — Z79899 Other long term (current) drug therapy: Secondary | ICD-10-CM | POA: Diagnosis not present

## 2020-05-09 DIAGNOSIS — R682 Dry mouth, unspecified: Secondary | ICD-10-CM | POA: Diagnosis not present

## 2020-05-09 DIAGNOSIS — I480 Paroxysmal atrial fibrillation: Secondary | ICD-10-CM | POA: Insufficient documentation

## 2020-05-09 DIAGNOSIS — R109 Unspecified abdominal pain: Secondary | ICD-10-CM | POA: Diagnosis not present

## 2020-05-09 DIAGNOSIS — S22089A Unspecified fracture of T11-T12 vertebra, initial encounter for closed fracture: Secondary | ICD-10-CM

## 2020-05-09 DIAGNOSIS — K5900 Constipation, unspecified: Secondary | ICD-10-CM | POA: Diagnosis not present

## 2020-05-09 DIAGNOSIS — E038 Other specified hypothyroidism: Secondary | ICD-10-CM | POA: Diagnosis not present

## 2020-05-09 DIAGNOSIS — N390 Urinary tract infection, site not specified: Secondary | ICD-10-CM

## 2020-05-09 DIAGNOSIS — Z87891 Personal history of nicotine dependence: Secondary | ICD-10-CM | POA: Diagnosis not present

## 2020-05-09 DIAGNOSIS — K5641 Fecal impaction: Secondary | ICD-10-CM | POA: Diagnosis not present

## 2020-05-09 DIAGNOSIS — M546 Pain in thoracic spine: Secondary | ICD-10-CM | POA: Diagnosis not present

## 2020-05-09 LAB — CBC WITH DIFFERENTIAL/PLATELET
Abs Immature Granulocytes: 0.05 10*3/uL (ref 0.00–0.07)
Basophils Absolute: 0 10*3/uL (ref 0.0–0.1)
Basophils Relative: 0 %
Eosinophils Absolute: 0.1 10*3/uL (ref 0.0–0.5)
Eosinophils Relative: 1 %
HCT: 34.4 % — ABNORMAL LOW (ref 36.0–46.0)
Hemoglobin: 11.5 g/dL — ABNORMAL LOW (ref 12.0–15.0)
Immature Granulocytes: 1 %
Lymphocytes Relative: 23 %
Lymphs Abs: 1.8 10*3/uL (ref 0.7–4.0)
MCH: 27.6 pg (ref 26.0–34.0)
MCHC: 33.4 g/dL (ref 30.0–36.0)
MCV: 82.5 fL (ref 80.0–100.0)
Monocytes Absolute: 1.3 10*3/uL — ABNORMAL HIGH (ref 0.1–1.0)
Monocytes Relative: 17 %
Neutro Abs: 4.6 10*3/uL (ref 1.7–7.7)
Neutrophils Relative %: 58 %
Platelets: 266 10*3/uL (ref 150–400)
RBC: 4.17 MIL/uL (ref 3.87–5.11)
RDW: 14.2 % (ref 11.5–15.5)
WBC: 7.9 10*3/uL (ref 4.0–10.5)
nRBC: 0 % (ref 0.0–0.2)

## 2020-05-09 LAB — COMPREHENSIVE METABOLIC PANEL
ALT: 16 U/L (ref 0–44)
AST: 20 U/L (ref 15–41)
Albumin: 3.8 g/dL (ref 3.5–5.0)
Alkaline Phosphatase: 79 U/L (ref 38–126)
Anion gap: 11 (ref 5–15)
BUN: 9 mg/dL (ref 8–23)
CO2: 23 mmol/L (ref 22–32)
Calcium: 9.1 mg/dL (ref 8.9–10.3)
Chloride: 91 mmol/L — ABNORMAL LOW (ref 98–111)
Creatinine, Ser: 0.82 mg/dL (ref 0.44–1.00)
GFR, Estimated: >60 mL/min (ref >60)
Glucose, Bld: 115 mg/dL — ABNORMAL HIGH (ref 70–99)
Potassium: 4.3 mmol/L (ref 3.5–5.1)
Sodium: 125 mmol/L — ABNORMAL LOW (ref 135–145)
Total Bilirubin: 0.4 mg/dL (ref 0.3–1.2)
Total Protein: 6.4 g/dL — ABNORMAL LOW (ref 6.5–8.1)

## 2020-05-09 LAB — URINALYSIS, ROUTINE W REFLEX MICROSCOPIC
Bilirubin Urine: NEGATIVE
Glucose, UA: NEGATIVE mg/dL
Hgb urine dipstick: NEGATIVE
Ketones, ur: NEGATIVE mg/dL
Nitrite: NEGATIVE
Specific Gravity, Urine: 1.009 (ref 1.005–1.030)
WBC, UA: >50 WBC/hpf — ABNORMAL HIGH (ref 0–5)
pH: 6 (ref 5.0–8.0)

## 2020-05-09 LAB — LIPASE, BLOOD: Lipase: 24 U/L (ref 11–51)

## 2020-05-09 MED ORDER — CEPHALEXIN 500 MG PO CAPS: 500.0000 mg | ORAL_CAPSULE | Freq: Four times a day (QID) | ORAL | 0 refills | Status: DC

## 2020-05-09 MED ORDER — IOHEXOL 300 MG/ML  SOLN
100.0000 mL | Freq: Once | INTRAMUSCULAR | Status: AC | PRN
  Administered 2020-05-09: 80 mL via INTRAVENOUS

## 2020-05-09 MED ORDER — METHOCARBAMOL 500 MG PO TABS: 500.0000 mg | ORAL_TABLET | Freq: Three times a day (TID) | ORAL | 0 refills | Status: DC | PRN

## 2020-05-09 MED ORDER — LACTATED RINGERS IV BOLUS
1000.0000 mL | Freq: Once | INTRAVENOUS | Status: AC
  Administered 2020-05-09: 1000 mL via INTRAVENOUS

## 2020-05-09 NOTE — ED Triage Notes (Signed)
Reports dysuria and frequency x 1 day , constipation x 2 weeks , yet 1 BM yesterday " it was smaller than usual".  Abdominal distention .

## 2020-05-09 NOTE — Discharge Instructions (Signed)
Please follow-up with your primary doctor and your orthopedic doctor.  If you do not have an orthopedic doctor, you can call the neurosurgeon listed in the discharge instructions.  It is very important that you have a follow-up with your primary doctor for recheck on Monday and have a repeat of your sodium levels.  For now, I would recommend continuing your fiber supplement and take MiraLAX 1 dose daily for constipation but I would hold off on other aggressive bowel regimen measures unless further instructed by your primary doctor on Monday.  If you feel your pain is uncontrolled, you develop any confusion, vomiting, or other new concern symptom, return to ER for reassessment.

## 2020-05-09 NOTE — ED Provider Notes (Signed)
[facility] Provider Note   CSN: [id number] Arrival date & time: 05/09/20  1207     History Chief Complaint  Patient presents with  . Dysuria  . Constipation    Kathryn Gibson is a 87 y.o. female.  Patient is an 87 year old female with a history of IBS, hypertension, constipation, atrial fibrillation on Xarelto who is presenting today with several complaints.  Patient reports for the last 2 weeks she has had symptoms of constipation despite taking multiple laxatives including MiraLAX with Gatorade, stool softeners and she will pass some water but has not had much of a solid bowel movement.  She does report yesterday she did have some solid stool and passed a small amount but today has not had a bowel movement at all.  She is also noticed over the last 2 weeks she has had worsening back pain in the flank region and lower thoracic spine.  It is worse with movement but does not radiate.  She denies any weakness in her legs.  She does note that in the last day she has had more difficulty urinating.  She just does not urinate much but does not have a sensation that she needs to urinate currently.  She denies any fever.  There has been some nausea but no vomiting.  Today she has not had a bowel movement or passed any gas.  She saw her doctor on 04/28/2020 and had blood work done and a urine and reports that that was normal.  She had an x-ray that showed constipation.  However symptoms are not improving and after talking with the doctor today they recommended she come here for further evaluation.  She currently denies any chest pain, palpitations or shortness of breath but complains of a lot of bloating and distention of her abdomen.  The history is provided by the patient and medical records.  Dysuria Constipation Associated symptoms: dysuria        Past Medical History:  Diagnosis Date  . Arthritis   . Atrial fibrillation ([place])   . Bradycardia    s/p PPM  .  Cataract   . Colonic polyp    adenomatous  . Constipation   . Diverticulosis   . Hemorrhoids   . Hypertension   . Hypothyroidism   . Irritable bowel syndrome (IBS)   . Pacemaker 2010    Patient Active Problem List   Diagnosis Date Noted  . Genital herpes simplex 09/18/2019  . Fatigue 06/08/2018  . Chronic rhinitis 12/14/2016  . [place] 12/14/2016  . Rectal pain 11/12/2016  . Ptosis of eyelid 04/05/2016  . A-fib ([place]) 07/01/2015  . Hypokalemia   . PAF (paroxysmal atrial fibrillation) ([place]) 07/16/2014  . Acute blood loss anemia   . Hypomagnesemia   . Chronic atrial fibrillation ([place])   . Essential hypertension   . Other specified hypothyroidism   . Posterior epistaxis   . Hematoma complicating a procedure   . Epistaxis 07/14/2014  . Diastolic dysfunction 18/84/1660  . Epistaxis, recurrent 07/14/2014  . Severe epistaxis 07/14/2014  . Sick sinus syndrome ([place]) 04/29/2014  . Encounter for therapeutic drug monitoring 04/04/2013  . Sciatica of left side 11/22/2011  . Aortic valve insufficiency 03/25/2011  . Dyspnea 11/24/2010  . Atrial fibrillation ([place]) 05/19/2010  . Essential hypertension, benign 04/18/2010  . Hypothyroidism 04/15/2010  . PALPITATIONS 04/15/2010  . BRADYCARDIA-TACHYCARDIA SYNDROME 12/11/2008  . IRRITABLE BOWEL SYNDROME 12/11/2008  . COLONIC POLYPS, ADENOMATOUS, HX OF 12/11/2008  . CONSTIPATION 11/13/2008  .  ABDOMINAL BLOATING 11/13/2008  . COAGULOPATHY 07/03/2008  . Constipation 07/03/2008  . CHANGE IN BOWELS 07/03/2008  . UNSPECIFIED DISORDER OF THYROID 07/02/2008  . DIVERTICULOSIS, COLON 07/02/2008    Past Surgical History:  Procedure Laterality Date  . BREAST CYST ASPIRATION Left 1980s  . COLONOSCOPY    . ELECTROPHYSIOLOGIC STUDY N/A 07/01/2015   Procedure: Atrial Fibrillation Ablation;  Surgeon: Thompson Grayer, MD;  Location: [facility];  Service: Cardiovascular;  Laterality: N/A;  . EYE SURGERY    . HEMATOMA EVACUATION Right 07/15/2014    Procedure: EVACUATION Right Groin Hematoma;  Surgeon: Rosetta Posner, MD;  Location: [facility];  Service: Vascular;  Laterality: Right;  . NASAL ENDOSCOPY WITH EPISTAXIS CONTROL N/A 07/14/2014   Procedure: NASAL ENDOSCOPY WITH EPISTAXIS CONTROL;  Surgeon: Ruby Cola, MD;  Location: [facility];  Service: ENT;  Laterality: N/A;  . NASAL ENDOSCOPY WITH EPISTAXIS CONTROL Bilateral 07/15/2014   Procedure: NASAL ENDOSCOPY WITH EPISTAXIS CONTROL;  Surgeon: Ruby Cola, MD;  Location: [facility];  Service: ENT;  Laterality: Bilateral;  . PACEMAKER INSERTION  2010   implanted by Dr Doreatha Lew (MDT)  . PPM GENERATOR CHANGEOUT N/A 08/16/2019   Procedure: PPM GENERATOR CHANGEOUT;  Surgeon: Thompson Grayer, MD;  Location: [facility];  Service: Cardiovascular;  Laterality: N/A;  . RADIOLOGY WITH ANESTHESIA N/A 07/14/2014   Procedure: RADIOLOGY WITH ANESTHESIA;  Surgeon: Medication Radiologist, MD;  Location: [facility];  Service: Radiology;  Laterality: N/A;  . TEE WITHOUT CARDIOVERSION N/A 07/01/2015   Procedure: TRANSESOPHAGEAL ECHOCARDIOGRAM (TEE);  Surgeon: Sueanne Margarita, MD;  Location[facility];  Service: Cardiovascular;  Laterality: N/A;     OB History   No obstetric history on file.     Family History  Problem Relation Age of Onset  . Hypertension Mother   . Congestive Heart Failure Mother   . Dementia Mother   . Heart attack Father 56       MI cause of death  . Diabetes Paternal Grandfather   . CAD Brother        2 stents  . Lung cancer Son   . Leukemia Daughter   . Colon cancer Neg Hx   . Kidney disease Neg Hx   . Liver disease Neg Hx   . Esophageal cancer Neg Hx   . Stroke Neg Hx     Social History   Tobacco Use  . Smoking status: Former Smoker    Types: Cigarettes    Quit date: 02/09/1980    Years since quitting: 40.2  . Smokeless tobacco: Never Used  Vaping Use  . Vaping Use: Never used  Substance Use Topics  . Alcohol use: Yes    Alcohol/week: 14.0 standard drinks    Types: 14  Glasses of wine per week    Comment: wine 6 days a week  . Drug use: No    Home Medications Prior to Admission medications   Medication Sig Start Date End Date Taking? Authorizing Provider  acetaminophen (TYLENOL) 500 MG tablet Take 500-1,000 mg by mouth every 6 (six) hours as needed for moderate pain or headache.    [provider]  Artificial Tear Ointment (DRY EYES OP) Place 1 drop into both eyes daily as needed (Dry eye).    [provider]  Ascorbic Acid (VITAMIN C) 1000 MG tablet Take 1,000 mg by mouth daily.      [provider]  Calcium-Vitamin D (CALTRATE 600 PLUS-VIT D PO) Take 1 tablet by mouth daily.  [provider]  cholecalciferol (VITAMIN D) 1000 UNITS tablet Take 1,000 Units by mouth daily.      [provider]  Coenzyme Q10 (CO Q-10 PO) Take 1 tablet by mouth daily.    [provider]  COLLAGEN PO Take 2 Scoops by mouth daily.    [provider]  cromolyn (OPTICROM) 4 % ophthalmic solution 1 drop 4 (four) times daily. 10/17/19   [provider]  diltiazem (CARDIZEM CD) 360 MG 24 hr capsule Take 1 capsule (360 mg total) by mouth daily. 02/27/20   Shirley Friar, PA-C  diltiazem (CARDIZEM) 30 MG tablet Take 1/2 to 1 tablet every 4 hours AS NEEDED for heart rate >100 as long as top blood pressure >100. 11/02/19   Sherran Needs, NP  flecainide (TAMBOCOR) 50 MG tablet Take 1 tablet (50 mg total) by mouth 2 (two) times daily. 02/27/20   Shirley Friar, PA-C  levothyroxine (SYNTHROID) 50 MCG tablet Take 50 mcg by mouth once a week.    [provider]  levothyroxine (SYNTHROID) 75 MCG tablet Take 75 mcg by mouth daily before breakfast. 6 days a week    [provider]  Lysine 1000 MG TABS Take 1,000 mg by mouth as needed.    [provider]  methylcellulose (CITRUCEL) oral powder Take 2 packets by mouth daily.    [provider]  metoprolol succinate (TOPROL  XL) 50 MG 24 hr tablet Take 75mg  TWICE A DAY 02/27/20   Shirley Friar, PA-C  Multiple Vitamins-Minerals (ICAPS AREDS 2 PO) Take 1 capsule by mouth daily.     [provider]  Omega-3 Fatty Acids (FISH OIL) 1200 MG CAPS Take 1,200 mg by mouth daily. Mega Red.    [provider]  polyethylene glycol powder (GLYCOLAX/MIRALAX) powder Take 0.5 Containers by mouth at bedtime.     [provider]  pravastatin (PRAVACHOL) 10 MG tablet Take 1 tablet by mouth once daily 04/24/20   Allred, Jeneen Rinks, MD  Probiotic Product (ALIGN PO) Take 1 capsule by mouth daily.    [provider]  sodium chloride (OCEAN) 0.65 % SOLN nasal spray Place 1 spray into both nostrils daily.    [provider]  temazepam (RESTORIL) 30 MG capsule Take 30 mg by mouth at bedtime.    [provider]  XARELTO 15 MG TABS tablet TAKE 1 TABLET BY MOUTH ONCE DAILY WITH SUPPER 01/07/20   [name], MD  Zinc 50 MG CAPS Take 50 mg by mouth daily.  01/22/19   [provider]    Allergies    Trazodone and nefazodone  Review of Systems   Review of Systems  Gastrointestinal: Positive for constipation.  Genitourinary: Positive for dysuria.  All other systems reviewed and are negative.   Physical Exam Updated Vital Signs BP (!) 145/71   Pulse 72   Temp 97.8 F (36.6 C) (Oral)   Resp 20   Ht 5\' 5"  (1.651 m)   Wt 58.5 kg   SpO2 99%   BMI 21.47 kg/m   Physical Exam Vitals and nursing note reviewed.  Constitutional:      General: She is not in acute distress.    Appearance: Normal appearance. She is well-developed and normal weight.  HENT:     Head: Normocephalic and atraumatic.     Mouth/Throat:     Mouth: Mucous membranes are dry.  Eyes:     Conjunctiva/sclera: Conjunctivae normal.     Pupils: Pupils are  equal, round, and reactive to light.  Cardiovascular:     Rate and Rhythm: Normal rate and regular rhythm.     Heart sounds: No murmur  heard.   Pulmonary:     Effort: Pulmonary effort is normal. No respiratory distress.     Breath sounds: Normal breath sounds. No wheezing or rales.  Abdominal:     General: Bowel sounds are decreased. There is distension.     Palpations: Abdomen is soft.     Tenderness: There is no abdominal tenderness. There is right CVA tenderness and left CVA tenderness. There is no guarding or rebound.     Comments: Minimal right and left CVA tenderness  Genitourinary:    Comments: Non-inflamed hemorrhoids.  No stool in the vault Musculoskeletal:        General: No tenderness. Normal range of motion.     Cervical back: Normal range of motion and neck supple.     Right lower leg: No edema.     Left lower leg: No edema.  Skin:    General: Skin is warm and dry.     Findings: No erythema or rash.  Neurological:     Mental Status: She is alert and oriented to person, place, and time. Mental status is at baseline.     Motor: No weakness.     Gait: Gait normal.  Psychiatric:        Mood and Affect: Mood normal.        Behavior: Behavior normal.        Thought Content: Thought content normal.     ED Results / Procedures / Treatments   Labs (all labs ordered are listed, but only abnormal results are displayed) Labs Reviewed  CBC WITH DIFFERENTIAL/PLATELET - Abnormal; Notable for the following components:      Result Value   Hemoglobin 11.5 (*)    HCT 34.4 (*)    Monocytes Absolute 1.3 (*)    All other components within normal limits  COMPREHENSIVE METABOLIC PANEL - Abnormal; Notable for the following components:   Sodium 125 (*)    Chloride 91 (*)    Glucose, Bld 115 (*)    Total Protein 6.4 (*)    All other components within normal limits  LIPASE, BLOOD  URINALYSIS, ROUTINE W REFLEX MICROSCOPIC    EKG None  Radiology DG Abd 2 Views  Result Date: 05/08/2020 CLINICAL DATA:  Bloating and chronic constipation, subsequent encounter EXAM: ABDOMEN - 2 VIEW COMPARISON:  03/25/2020  FINDINGS: Scattered large and small bowel gas is noted. Air-fluid levels are noted within the colon and small bowel although no definitive obstructive changes are seen. No free air is noted. No abnormal mass or abnormal calcifications are noted. Bony structures are stable. IMPRESSION: Scattered air-fluid levels throughout the colon and small bowel without definitive obstructive changes. Electronically Signed   By: Inez Catalina M.D.   On: 05/08/2020 02:40    Procedures Procedures   Medications Ordered in ED Medications  lactated ringers bolus 1,000 mL (has no administration in time range)    ED Course  I have reviewed the triage vital signs and the nursing notes.  Pertinent labs & imaging results that were available during my care of the patient were reviewed by me and considered in my medical decision making (see chart for details).    MDM Rules/Calculators/A&P                          Elderly  female presenting today with complaint of persistent constipation, now having some difficulty urinating and complaining of flank pain and mid back pain.  All this has been present for approximately 2 weeks has been gradually worsening.  Patient did have a bowel movement yesterday that had some form but has not had any bowel movements today despite taking multiple laxatives.  She has been seen by PCP on 21 March and had normal labs and urine.  Had an x-ray done yesterday that showed scattered air-fluid levels throughout the colon and small bowel without definitive obstruction.  Patient seen in the emergency room on 03/31/2020 for similar symptoms and at that time was found to have fecalized material within the distal small bowel suggestive of an incompetent ileocecal valve or slow transit state from the small to large bowel but no signs of obstruction.  She also had interval increase in size and development of a hypodense pancreatic lesion that could represent pseudocyst versus intraductal papillary mucinous  neoplasm which they recommended reimaging in 1 year.  Patient has decreased bowel sounds on exam today.  She does not have localized abdominal tenderness but is distended.  Bladder scan shows a volume of 110 mL of urine.  She has no evidence of impaction.  We will recheck labs given patient's ongoing symptoms to ensure electrolytes are normal.  Will repeat CT to ensure no evidence of obstruction as patient has not had any bowel movements today or flatus.  2:01 PM CBC wnl.  CMP with new hyponatremia 125 from 134.  Cr is at baseline.  Pt does reports she has been drinking a lot of water and so may be from excessive water intake. Final Clinical Impression(s) / ED Diagnoses Final diagnoses:  None    Rx / DC Orders ED Discharge Orders    None       Blanchie Dessert, MD 05/09/20 1519

## 2020-05-09 NOTE — ED Provider Notes (Signed)
Signout note  87 year old lady  IBS, hypertension, constipation, atrial fibrillation on Xarelto presents to ER with myriad complaints.  Basic labs were obtained, concerning for hyponatremia, UTI.  CT scan ordered to further evaluate.  3:00 PM Received signout from Dr. Maryan Rued  4:46 PM Reviewed CT scan, likely T12 compression fracture, distal pancreas lesion, no other acute findings.  Discussed hyponatremia, UTI, T12 fracture and pancreas lesion with patient and daughter at bedside.  Patient reports that she was aware of a T12 fracture and had plans to follow-up with her orthopedist.  Suspect hyponatremia may be related to aggressive bowel regimen, dietary changes recently.  Recommend patient pursue a simpler bowel regimen and discussed dietary modifications.  Discussed risks and benefits of admission versus management in the outpatient setting.  Patient states she strongly desires to be managed in the outpatient setting if at all possible.  Given her well appearance, stable vital signs, believe this is a reasonable option.  Recommended that she have a close recheck with her PCP and repeat of her blood work on Monday.  Discussed return precautions and discharged home.  After the discussed management above, the patient was determined to be safe for discharge.  The patient was in agreement with this plan and all questions regarding their care were answered.  ED return precautions were discussed and the patient will return to the ED with any significant worsening of condition.     Kathryn Starch, MD 05/09/20 [phone]

## 2020-05-13 ENCOUNTER — Other Ambulatory Visit (INDEPENDENT_AMBULATORY_CARE_PROVIDER_SITE_OTHER): Payer: Medicare Other

## 2020-05-13 ENCOUNTER — Encounter: Payer: Self-pay | Admitting: Physician Assistant

## 2020-05-13 ENCOUNTER — Ambulatory Visit: Payer: Medicare Other | Admitting: Physician Assistant

## 2020-05-13 VITALS — BP 122/70 | HR 83 | Ht 60.25 in | Wt 127.6 lb

## 2020-05-13 DIAGNOSIS — Z7901 Long term (current) use of anticoagulants: Secondary | ICD-10-CM

## 2020-05-13 DIAGNOSIS — R34 Anuria and oliguria: Secondary | ICD-10-CM

## 2020-05-13 DIAGNOSIS — R194 Change in bowel habit: Secondary | ICD-10-CM

## 2020-05-13 LAB — CBC WITH DIFFERENTIAL/PLATELET
Basophils Absolute: 0 10*3/uL (ref 0.0–0.1)
Basophils Relative: 0.6 % (ref 0.0–3.0)
Eosinophils Absolute: 0.1 10*3/uL (ref 0.0–0.7)
Eosinophils Relative: 1.1 % (ref 0.0–5.0)
HCT: 36.3 % (ref 36.0–46.0)
Hemoglobin: 12.1 g/dL (ref 12.0–15.0)
Lymphocytes Relative: 27.1 % (ref 12.0–46.0)
Lymphs Abs: 2.1 10*3/uL (ref 0.7–4.0)
MCHC: 33.3 g/dL (ref 30.0–36.0)
MCV: 83 fl (ref 78.0–100.0)
Monocytes Absolute: 1.1 10*3/uL — ABNORMAL HIGH (ref 0.1–1.0)
Monocytes Relative: 14.8 % — ABNORMAL HIGH (ref 3.0–12.0)
Neutro Abs: 4.3 10*3/uL (ref 1.4–7.7)
Neutrophils Relative %: 56.4 % (ref 43.0–77.0)
Platelets: 300 10*3/uL (ref 150.0–400.0)
RBC: 4.38 Mil/uL (ref 3.87–5.11)
RDW: 15.3 % (ref 11.5–15.5)
WBC: 7.7 10*3/uL (ref 4.0–10.5)

## 2020-05-13 LAB — COMPREHENSIVE METABOLIC PANEL
ALT: 14 U/L (ref 0–35)
AST: 19 U/L (ref 0–37)
Albumin: 4 g/dL (ref 3.5–5.2)
Alkaline Phosphatase: 86 U/L (ref 39–117)
BUN: 15 mg/dL (ref 6–23)
CO2: 29 mEq/L (ref 19–32)
Calcium: 9.4 mg/dL (ref 8.4–10.5)
Chloride: 96 mEq/L (ref 96–112)
Creatinine, Ser: 0.8 mg/dL (ref 0.40–1.20)
GFR: 67.41 mL/min (ref >60.00)
Glucose, Bld: 102 mg/dL — ABNORMAL HIGH (ref 70–99)
Potassium: 4.2 mEq/L (ref 3.5–5.1)
Sodium: 133 mEq/L — ABNORMAL LOW (ref 135–145)
Total Bilirubin: 0.4 mg/dL (ref 0.2–1.2)
Total Protein: 7 g/dL (ref 6.0–8.3)

## 2020-05-13 MED ORDER — NA SULFATE-K SULFATE-MG SULF 17.5-3.13-1.6 GM/177ML PO SOLN: 1.0000 | Freq: Once | ORAL | 0 refills | Status: AC

## 2020-05-13 NOTE — Patient Instructions (Addendum)
If you are age 85 or older, your body mass index should be between 23-30. Your Body mass index is 24.71 kg/m. If this is out of the aforementioned range listed, please consider follow up with your Primary Care Provider.  If you are age 48 or younger, your body mass index should be between 19-25. Your Body mass index is 24.71 kg/m. If this is out of the aformentioned range listed, please consider follow up with your Primary Care Provider.    2 DAY PREPARATION FOR COLONOSCOPY WITH SUPREP AND MIRALAX  THIS IS A SPLIT-DOSE PREP YOU WILL NOT BE DRINKING THE ENTIRE PREP AT THE SAME TIME   FOLLOW THESE INSTRUCTIONS, NOT INSTRUCTIONS ON SUPREP BOX  STARTING FIVE DAYS BEFORE YOUR PROCEDURE  (Date 05/25/20) Do not eat nuts, seeds, popcorn, corn, beans, peas, salads, or any raw vegetables. Do not take any fiber supplements (e.g. Metamucil, Citrucel, and Benefiber).  In addition to purchasing Suprep at your pharmacy, please purchase the following over the counter: One 4.1 ounce bottle (105 grams) of Miralax  One bottle of Dulcolax 5 mg tablets (you will need 4 tablets)  One 32 oz. bottle of Gatorade (NO RED OR PURPLE) ______________________________________________________________________  2 DAYS BEFORE PROCEDURE           DATE: 05/23/20         DAY: Friday  1. In the morning, mix 7 capfuls (105 grams) of Miralax with 32 oz Gatorade and refrigerate.    2. Eat a regular diet until start of prep in the evening (around 4 pm), then drink clear liquids only.  3. At 3:00 pm take 2 Dulcolax tablets.  4. Between 5:00pm and 7:00 pm, Drink 8 oz. of Miralax mixture every 15 minutes until solution is gone.  5. At 8:00 pm take 2 more Dulcolax tablets.  _______________________________________________________________________    Kathryn Gibson have been scheduled for a colonoscopy. Please follow written instructions given to you at your visit today.  Please pick up your prep supplies at the pharmacy within the next 1-3  days. If you use inhalers (even only as needed), please bring them with you on the day of your procedure.  Your provider has requested that you go to the basement level for lab work before leaving today. Press "B" on the elevator. The lab is located at the first door on the left as you exit the elevator.  Due to recent changes in healthcare laws, you may see the results of your imaging and laboratory studies on MyChart before your provider has had a chance to review them.  We understand that in some cases there may be results that are confusing or concerning to you. Not all laboratory results come back in the same time frame and the provider may be waiting for multiple results in order to interpret others.  Please give [place] 48 hours in order for your provider to thoroughly review all the results before contacting the office for clarification of your results.   Thank you for choosing me and [facility].  Ellouise Newer, PA-C

## 2020-05-13 NOTE — Progress Notes (Signed)
1.  Plans for high risk colonoscopy noted.  Reasonable given her persistent problems without resolution 2.  Any urologic complaints and issues with electrolyte problems to be addressed with her PCP 3.  Pending the results of colonoscopy, additional recommendations to follow.  Agree with continuing MiraLAX in the interim.

## 2020-05-13 NOTE — Progress Notes (Addendum)
Chief Complaint: Change in bowel habits, decrease in urine output  HPI:    Kathryn Gibson is an 87 year old female with past medical history as listed below including A. fib on Xarelto, known to Dr. Henrene Pastor, who was referred to me by Prince Solian, MD for a complaint of change in bowel habits and a decrease in urine output.      06/12/2014 colonoscopy done for surveillance due to prior colonic neoplasia and PH Colon Adenoma. Multiple prior colonoscopies. Had HGD in small polyps 2006 and 2013.  At that time finding of severe diverticulosis in the left colon otherwise normal.  Repeat recommended in 5 years the patient was medically fit and willing.    04/11/2019 patient seen in clinic by Dr. Henrene Pastor and at that time it was described she had a history of constipation predominant IBS and adenomatous colon polyps last evaluated June 2019.  Last colonoscopy 2013.  She reported 2-year history of fecal incontinence.  It was thought her fecal incontinence is secondary to poor rectal tone.  Recommend she is Citrucel 2 tablespoons daily.    03/22/2020 abdominal x-ray with gas-filled loops of mildly dilated bowel throughout the abdomen, favored represent ileus, although early distal large bowel obstruction was too difficult to exclude.  Mild stool burden and small right pleural effusion.  She was sent to the ER.    03/22/2020 patient seen in the ER for constipation.  Labs showed a sodium of 134, CBC with hemoglobin of 11.8, normal lipase.  CT of the abdomen pelvis with contrast showed fast transition state of the large bowel, fecalized material within the distal small bowel suggestive of an incomplete ileocecal valve or slow transition state from the small to large bowel, no findings of bowel obstruction or perforation.  Bilateral trace pleural effusions and interval increase in size and development of hypodense pancreatic lesions that could represent pseudocyst versus intraductal papillary mucinous neoplasms.  Is recommended  she have reimaging in 1 year with MRI/MRCP with pancreatic protocol.    03/24/2020 patient called and expressed that she was constipated not been able to go to the bathroom in a week.  She had been taking 2 capsules of MiraLAX and 6 Dulcolax tabs a day and also tried hot tea, 10 ounces of mag citrate twice with no relief.  Was recommended she try MiraLAX purge and remain on a liquid diet.    03/25/2020 office visit with me and at that time described not having a bowel movement for period of 6 days.  Initially went to the urgent care so she had loose ileus and went to the ER and had a CT which is reassuring.  She was using all the laxatives in the world including mag citrate etc. but nothing was working.  Most recently she had done a MiraLAX bowel purge overnight and did produce some liquid stool with sediment in it.  At that time repeated abdominal x-ray.    03/25/2020 x-ray showed nonspecific nonobstructive bowel gas pattern with a few scattered air-fluid levels likely over the colon.    05/08/2020 abdominal x-ray showed scattered air-fluid levels throughout the colon and small bowel without definitive obstructive changes.    05/09/2020 patient seen in the emergency department due to a decreased urine output in conjunction with her constipation.  CT the abdomen pelvis with contrast showed acute appearing compression fracture of the superior endplate of Z61, no other acute intra-abdominal pelvic pathology, no bowel obstruction but moderate stool throughout the colon.  A 17 mm cystic  structure in the distal pancreas is seen on prior CT.  Follow-up MRI/MRCP was recommended in 1 year.  CMP showed a decreased sodium of 125 and otherwise normal.    Patient is done to further MiraLAX purges since being seen last.    Today, patient returns to clinic accompanied by her daughter.  Tells me that currently she is using MiraLAX at night and Citrucel in the morning, this is a decrease from what she was using previously due to a  finding of some hyponatremia when in the ER which they thought could be related to her recent bowel purges.  Explains that she did have what seemed like some good solid stool yesterday, but this morning continued with just "water and sentiment".  She never feels like she is empty and feels bloated constantly, worse after eating.  Tells me over the past week or so she has now had decreased urine output feeling like she has to go but cannot seem to get anything out.  When she does have a good bowel movement like yesterday she was able to void some urine.  Did void a small amount this morning.  Overall remains uncomfortable.  Reminds me that she test positive for blood in her stool at her recent physical.    Denies fever, chills, weight loss or symptoms that awaken her from sleep.  Past Medical History:  Diagnosis Date  . Arthritis   . Atrial fibrillation ([place])   . Bradycardia    s/p PPM  . Cataract   . Colonic polyp    adenomatous  . Constipation   . Diverticulosis   . Hemorrhoids   . Hypertension   . Hypothyroidism   . Irritable bowel syndrome (IBS)   . Pacemaker 2010    Past Surgical History:  Procedure Laterality Date  . BREAST CYST ASPIRATION Left 1980s  . COLONOSCOPY    . ELECTROPHYSIOLOGIC STUDY N/A 07/01/2015   Procedure: Atrial Fibrillation Ablation;  Surgeon: Thompson Grayer, MD;  Location: [place] [facility];  Service: Cardiovascular;  Laterality: N/A;  . EYE SURGERY    . HEMATOMA EVACUATION Right 07/15/2014   Procedure: EVACUATION Right Groin Hematoma;  Surgeon: Rosetta Posner, MD;  Location: [place];  Service: Vascular;  Laterality: Right;  . NASAL ENDOSCOPY WITH EPISTAXIS CONTROL N/A 07/14/2014   Procedure: NASAL ENDOSCOPY WITH EPISTAXIS CONTROL;  Surgeon: Ruby Cola, MD;  Location: [place];  Service: ENT;  Laterality: N/A;  . NASAL ENDOSCOPY WITH EPISTAXIS CONTROL Bilateral 07/15/2014   Procedure: NASAL ENDOSCOPY WITH EPISTAXIS CONTROL;  Surgeon: Ruby Cola, MD;  Location: [place];   Service: ENT;  Laterality: Bilateral;  . PACEMAKER INSERTION  2010   implanted by Dr Doreatha Lew (MDT)  . PPM GENERATOR CHANGEOUT N/A 08/16/2019   Procedure: PPM GENERATOR CHANGEOUT;  Surgeon: Thompson Grayer, MD;  Location: [place] [facility];  Service: Cardiovascular;  Laterality: N/A;  . RADIOLOGY WITH ANESTHESIA N/A 07/14/2014   Procedure: RADIOLOGY WITH ANESTHESIA;  Surgeon: Medication Radiologist, MD;  Location: [place];  Service: Radiology;  Laterality: N/A;  . TEE WITHOUT CARDIOVERSION N/A 07/01/2015   Procedure: TRANSESOPHAGEAL ECHOCARDIOGRAM (TEE);  Surgeon: Sueanne Margarita, MD;  Location: [facility];  Service: Cardiovascular;  Laterality: N/A;    Current Outpatient Medications  Medication Sig Dispense Refill  . acetaminophen (TYLENOL) 500 MG tablet Take 500-1,000 mg by mouth every 6 (six) hours as needed for moderate pain or headache.    . Artificial Tear Ointment (DRY EYES OP) Place 1 drop into both  eyes daily as needed (Dry eye).    . Ascorbic Acid (VITAMIN C) 1000 MG tablet Take 1,000 mg by mouth daily.      . Calcium-Vitamin D (CALTRATE 600 PLUS-VIT D PO) Take 1 tablet by mouth daily.    . cephALEXin (KEFLEX) 500 MG capsule Take 1 capsule (500 mg total) by mouth 4 (four) times daily. 20 capsule 0  . cholecalciferol (VITAMIN D) 1000 UNITS tablet Take 1,000 Units by mouth daily.      . Coenzyme Q10 (CO Q-10 PO) Take 1 tablet by mouth daily.    . COLLAGEN PO Take 2 Scoops by mouth daily.    . cromolyn (OPTICROM) 4 % ophthalmic solution 1 drop 4 (four) times daily.    Marland Kitchen diltiazem (CARDIZEM CD) 360 MG 24 hr capsule Take 1 capsule (360 mg total) by mouth daily. 90 capsule 3  . diltiazem (CARDIZEM) 30 MG tablet Take 1/2 to 1 tablet every 4 hours AS NEEDED for heart rate >100 as long as top blood pressure >100. 45 tablet 1  . flecainide (TAMBOCOR) 50 MG tablet Take 1 tablet (50 mg total) by mouth 2 (two) times daily. 60 tablet 2  . levothyroxine (SYNTHROID) 50 MCG tablet Take 50 mcg by mouth once  a week.    . levothyroxine (SYNTHROID) 75 MCG tablet Take 75 mcg by mouth daily before breakfast. 6 days a week    . Lysine 1000 MG TABS Take 1,000 mg by mouth as needed.    . methocarbamol (ROBAXIN) 500 MG tablet Take 1 tablet (500 mg total) by mouth every 8 (eight) hours as needed for muscle spasms. 20 tablet 0  . methylcellulose (CITRUCEL) oral powder Take 2 packets by mouth daily.    . metoprolol succinate (TOPROL XL) 50 MG 24 hr tablet Take 75mg  TWICE A DAY 270 tablet 3  . Multiple Vitamins-Minerals (ICAPS AREDS 2 PO) Take 1 capsule by mouth daily.     . Omega-3 Fatty Acids (FISH OIL) 1200 MG CAPS Take 1,200 mg by mouth daily. Mega Red.    . polyethylene glycol powder (GLYCOLAX/MIRALAX) powder Take 0.5 Containers by mouth at bedtime.   0  . pravastatin (PRAVACHOL) 10 MG tablet Take 1 tablet by mouth once daily 90 tablet 3  . Probiotic Product (ALIGN PO) Take 1 capsule by mouth daily.    . sodium chloride (OCEAN) 0.65 % SOLN nasal spray Place 1 spray into both nostrils daily.    . temazepam (RESTORIL) 30 MG capsule Take 30 mg by mouth at bedtime.    Alveda Reasons 15 MG TABS tablet TAKE 1 TABLET BY MOUTH ONCE DAILY WITH SUPPER 90 tablet 1  . Zinc 50 MG CAPS Take 50 mg by mouth daily.      No current facility-administered medications for this visit.    Allergies as of 05/13/2020 - Review Complete 05/13/2020  Allergen Reaction Noted  . Trazodone and nefazodone  06/13/2018    Family History  Problem Relation Age of Onset  . Hypertension Mother   . Congestive Heart Failure Mother   . Dementia Mother   . Heart attack Father 27       MI cause of death  . Diabetes Paternal Grandfather   . CAD Brother        2 stents  . Lung cancer Son   . Leukemia Daughter   . Colon cancer Neg Hx   . Kidney disease Neg Hx   . Liver disease Neg Hx   . Esophageal cancer  Neg Hx   . Stroke Neg Hx     Social History   Socioeconomic History  . Marital status: Widowed    Spouse name: Not on file  .  Number of children: 3  . Years of education: Not on file  . Highest education level: Not on file  Occupational History  . Occupation: retired  Tobacco Use  . Smoking status: Former Smoker    Types: Cigarettes    Quit date: 02/09/1980    Years since quitting: 40.2  . Smokeless tobacco: Never Used  Vaping Use  . Vaping Use: Never used  Substance and Sexual Activity  . Alcohol use: Yes    Alcohol/week: 7.0 standard drinks    Types: 7 Glasses of wine per week  . Drug use: No  . Sexual activity: Not Currently  Other Topics Concern  . Not on file  Social History Narrative  . Not on file   Social Determinants of Health   Financial Resource Strain: Not on file  Food Insecurity: Not on file  Transportation Needs: Not on file  Physical Activity: Not on file  Stress: Not on file  Social Connections: Not on file  Intimate Partner Violence: Not on file    Review of Systems:    Constitutional: No weight loss, fever or chills Cardiovascular: No chest pain Respiratory: No SOB  Gastrointestinal: See HPI and otherwise negative   Physical Exam:  Vital signs: BP 122/70   Pulse 83   Ht 5' 0.25" (1.53 m)   Wt 127 lb 9.6 oz (57.9 kg)   SpO2 99%   BMI 24.71 kg/m   Constitutional:   Pleasant Elderly Caucasian female appears to be in NAD, Well developed, Well nourished, alert and cooperative  Respiratory: Respirations even and unlabored. Lungs clear to auscultation bilaterally.   No wheezes, crackles, or rhonchi.  Cardiovascular: Normal S1, S2. No MRG. Regular rate and rhythm. No peripheral edema, cyanosis or pallor.  Gastrointestinal:  Soft, mild distention, nontender. No rebound or guarding.  Decreased bowel sounds all 4 quadrants no appreciable masses or hepatomegaly. Rectal:  Not performed.  Psychiatric: Demonstrates good judgement and reason without abnormal affect or behaviors.  RELEVANT LABS AND IMAGING: CBC    Component Value Date/Time   WBC 7.9 05/09/2020 1249   RBC 4.17  05/09/2020 1249   HGB 11.5 (L) 05/09/2020 1249   HGB 12.1 02/27/2020 1243   HCT 34.4 (L) 05/09/2020 1249   HCT 36.3 02/27/2020 1243   PLT 266 05/09/2020 1249   PLT 244 02/27/2020 1243   MCV 82.5 05/09/2020 1249   MCV 87 02/27/2020 1243   MCH 27.6 05/09/2020 1249   MCHC 33.4 05/09/2020 1249   RDW 14.2 05/09/2020 1249   RDW 12.6 02/27/2020 1243   LYMPHSABS 1.8 05/09/2020 1249   LYMPHSABS 2.1 02/27/2020 1243   MONOABS 1.3 (H) 05/09/2020 1249   EOSABS 0.1 05/09/2020 1249   EOSABS 0.1 02/27/2020 1243   BASOSABS 0.0 05/09/2020 1249   BASOSABS 0.0 02/27/2020 1243    CMP     Component Value Date/Time   NA 125 (L) 05/09/2020 1249   NA 140 02/27/2020 1243   K 4.3 05/09/2020 1249   CL 91 (L) 05/09/2020 1249   CO2 23 05/09/2020 1249   GLUCOSE 115 (H) 05/09/2020 1249   BUN 9 05/09/2020 1249   BUN 16 02/27/2020 1243   CREATININE 0.82 05/09/2020 1249   CALCIUM 9.1 05/09/2020 1249   PROT 6.4 (L) 05/09/2020 1249   ALBUMIN 3.8 05/09/2020  1249   AST 20 05/09/2020 1249   ALT 16 05/09/2020 1249   ALKPHOS 79 05/09/2020 1249   BILITOT 0.4 05/09/2020 1249   GFRNONAA >60 05/09/2020 1249   GFRAA 58 (L) 02/27/2020 1243    Assessment: 1.  Change in bowel habits: Towards constipation over the past 3 to 4 months now, no real relief with daily laxatives and multiple bowel purges, history of polyps in the past, last colonoscopy 2016; consider polyp versus relation to T12 injury vs other 2.  Decreased urine output: Seems related to above, better when she is able to have a good stool 3.  Chronic anticoagulation for A. fib: On Xarelto  Plan: 1.  Scheduled patient for diagnostic colonoscopy in the [place] with Dr. Henrene Pastor at his next available due to a change in bowel habits.  Did provide the patient with a detailed list of risks for the procedure and she agrees to proceed.  She will do a 2-day bowel prep given history of constipation. 2.  Patient was advised to hold her Xarelto for 2 days prior to time of  procedure.  We will communicate with her prescribing physician to ensure this acceptable for her. 3.  Repeat CBC and CMP today. Patient asks that we share with PCP. Dr. Steva Ready Avva 4.  Continue MiraLAX nightly and Citrucel in the morning for now. 5.  Asked that the patient discuss her bowel and urinary symptoms with her orthopedic physician tomorrow to see if there is any overlap given her recent T12 injury. 6.  Patient may need to see a urologist given decrease in urine output recently, scenes tied to constipation as it gets better when she is able to have a good stool. 7.  Patient to follow in clinic per recommendations from Dr. Henrene Pastor after time of procedure.  Ellouise Newer, PA-C [facility] 05/13/2020, 2:02 PM  Cc: Prince Solian, MD

## 2020-05-14 ENCOUNTER — Other Ambulatory Visit: Payer: Self-pay

## 2020-05-14 ENCOUNTER — Telehealth: Payer: Self-pay

## 2020-05-14 DIAGNOSIS — E871 Hypo-osmolality and hyponatremia: Secondary | ICD-10-CM

## 2020-05-14 DIAGNOSIS — R194 Change in bowel habit: Secondary | ICD-10-CM

## 2020-05-14 DIAGNOSIS — R34 Anuria and oliguria: Secondary | ICD-10-CM

## 2020-05-14 DIAGNOSIS — M4854XA Collapsed vertebra, not elsewhere classified, thoracic region, initial encounter for fracture: Secondary | ICD-10-CM | POA: Diagnosis not present

## 2020-05-14 DIAGNOSIS — M546 Pain in thoracic spine: Secondary | ICD-10-CM | POA: Diagnosis not present

## 2020-05-14 NOTE — Telephone Encounter (Signed)
Patient with diagnosis of afib on Xarelto for anticoagulation.    Procedure: colonoscopy Date of procedure: 05/30/20  CHA2DS2-VASc Score = 5  This indicates a 7.2% annual risk of stroke. The patient's score is based upon: CHF History: Yes HTN History: Yes Diabetes History: No Stroke History: No Vascular Disease History: No Age Score: 2 Gender Score: 1     CrCl 37.6 ml/min Platelet count 300  Per office protocol, patient can hold Xarelto for 2 days prior to procedure.

## 2020-05-14 NOTE — Telephone Encounter (Signed)
[facility] Pre-operative Risk Assessment     Request for surgical clearance:     Endoscopy Procedure  What type of surgery is being performed?     Colonoscopy  When is this surgery scheduled?     05/30/20  What type of clearance is required ?   Pharmacy  Are there any medications that need to be held prior to surgery and how long? Xarelto 2 days   Practice name and name of physician performing surgery?      [facility]  What is your office phone and fax number?      Phone- [phone]  [phone]  Anesthesia type (None, local, MAC, general) ?       MAC

## 2020-05-14 NOTE — Telephone Encounter (Signed)
Pharmacy, can you please comment on how long Xarelto can be held for upcoming colonoscopy?  Thank you!

## 2020-05-14 NOTE — Telephone Encounter (Signed)
   Patient Name: FRITZIE PRIOLEAU  DOB: March 12, 1933  MRN: [id number]   Primary Cardiologist: [name], MD  Chart reviewed as part of pre-operative protocol coverage. Patient has colonoscopy scheduled for 05/30/2020 and we were asked to give our recommendations for holding Xarelto. Per Pharmacy and office protocol, patient can hold Xarelto for 2 days prior to procedure. This should be restarted as soon as able following procedure.   I will route this recommendation to the requesting party via Epic fax function and remove from pre-op pool.  Please call with questions.  Darreld Mclean, PA-C 05/14/2020, 10:17 AM

## 2020-05-16 ENCOUNTER — Ambulatory Visit (INDEPENDENT_AMBULATORY_CARE_PROVIDER_SITE_OTHER): Payer: Medicare Other

## 2020-05-16 DIAGNOSIS — I495 Sick sinus syndrome: Secondary | ICD-10-CM | POA: Diagnosis not present

## 2020-05-16 NOTE — Telephone Encounter (Signed)
Called patient and let her know that it was okay to hold Xarelto 2 days prior to procedure. Patient stated she did not get instructions for 2 day prep that was on her after visit summary and requested that I resend 2 day prep instructions via mychart.. 2 day prep instructions were sent through mychart.

## 2020-05-19 LAB — CUP PACEART REMOTE DEVICE CHECK
Battery Remaining Longevity: 146 mo
Battery Voltage: 3.13 V
Brady Statistic AP VP Percent: 56.39 %
Brady Statistic AP VS Percent: 1.35 %
Brady Statistic AS VP Percent: 0.78 %
Brady Statistic AS VS Percent: 41.49 %
Brady Statistic RA Percent Paced: 56.06 %
Brady Statistic RV Percent Paced: 55.62 %
Date Time Interrogation Session: 20220408100807
Implantable Lead Implant Date: 20101109
Implantable Lead Implant Date: 20101109
Implantable Lead Location: 753859
Implantable Lead Location: 753860
Implantable Lead Model: 4469
Implantable Lead Model: 4470
Implantable Lead Serial Number: 535369
Implantable Lead Serial Number: 657967
Implantable Pulse Generator Implant Date: 20210708
Lead Channel Impedance Value: 247 Ohm
Lead Channel Impedance Value: 399 Ohm
Lead Channel Impedance Value: 418 Ohm
Lead Channel Impedance Value: 437 Ohm
Lead Channel Pacing Threshold Amplitude: 0.5 V
Lead Channel Pacing Threshold Amplitude: 1 V
Lead Channel Pacing Threshold Pulse Width: 0.4 ms
Lead Channel Pacing Threshold Pulse Width: 0.4 ms
Lead Channel Sensing Intrinsic Amplitude: 0.25 mV
Lead Channel Sensing Intrinsic Amplitude: 0.25 mV
Lead Channel Sensing Intrinsic Amplitude: 9.125 mV
Lead Channel Sensing Intrinsic Amplitude: 9.125 mV
Lead Channel Setting Pacing Amplitude: 2 V
Lead Channel Setting Pacing Amplitude: 2.5 V
Lead Channel Setting Pacing Pulse Width: 0.4 ms
Lead Channel Setting Sensing Sensitivity: 1.2 mV

## 2020-05-20 DIAGNOSIS — R109 Unspecified abdominal pain: Secondary | ICD-10-CM | POA: Diagnosis not present

## 2020-05-20 DIAGNOSIS — R339 Retention of urine, unspecified: Secondary | ICD-10-CM | POA: Diagnosis not present

## 2020-05-20 DIAGNOSIS — K869 Disease of pancreas, unspecified: Secondary | ICD-10-CM | POA: Diagnosis not present

## 2020-05-20 DIAGNOSIS — S22080A Wedge compression fracture of T11-T12 vertebra, initial encounter for closed fracture: Secondary | ICD-10-CM | POA: Diagnosis not present

## 2020-05-21 ENCOUNTER — Encounter: Payer: Self-pay | Admitting: Internal Medicine

## 2020-05-21 ENCOUNTER — Telehealth: Payer: Self-pay

## 2020-05-21 ENCOUNTER — Ambulatory Visit: Payer: Medicare Other | Admitting: Internal Medicine

## 2020-05-21 ENCOUNTER — Other Ambulatory Visit: Payer: Self-pay

## 2020-05-21 VITALS — BP 112/66 | HR 75 | Ht 60.0 in | Wt 129.4 lb

## 2020-05-21 DIAGNOSIS — R3912 Poor urinary stream: Secondary | ICD-10-CM | POA: Diagnosis not present

## 2020-05-21 DIAGNOSIS — R001 Bradycardia, unspecified: Secondary | ICD-10-CM | POA: Diagnosis not present

## 2020-05-21 DIAGNOSIS — I10 Essential (primary) hypertension: Secondary | ICD-10-CM | POA: Diagnosis not present

## 2020-05-21 DIAGNOSIS — I480 Paroxysmal atrial fibrillation: Secondary | ICD-10-CM | POA: Diagnosis not present

## 2020-05-21 DIAGNOSIS — I495 Sick sinus syndrome: Secondary | ICD-10-CM | POA: Diagnosis not present

## 2020-05-21 DIAGNOSIS — I482 Chronic atrial fibrillation, unspecified: Secondary | ICD-10-CM

## 2020-05-21 NOTE — Progress Notes (Signed)
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 female who presents today for routine electrophysiology followup.  Since last being seen in our clinic, the patient reports doing reasonably well. She is primarily concerned with back pain.  She reports back fractures with pain that have significantly reduced her activity level.  Today, she denies symptoms of palpitations, chest pain, shortness of breath,  lower extremity edema, dizziness, presyncope, or syncope.  The patient is otherwise without complaint today.   Past Medical History:  Diagnosis Date  . Arthritis   . Atrial fibrillation ([place])   . Bradycardia    s/p PPM  . Cataract   . Colonic polyp    adenomatous  . Constipation   . Diverticulosis   . Hemorrhoids   . Hypertension   . Hypothyroidism   . Irritable bowel syndrome (IBS)   . Pacemaker 2010   Past Surgical History:  Procedure Laterality Date  . BREAST CYST ASPIRATION Left 1980s  . COLONOSCOPY    . ELECTROPHYSIOLOGIC STUDY N/A 07/01/2015   Procedure: Atrial Fibrillation Ablation;  Surgeon: Thompson Grayer, MD;  Location: [place] [facility];  Service: Cardiovascular;  Laterality: N/A;  . EYE SURGERY    . HEMATOMA EVACUATION Right 07/15/2014   Procedure: EVACUATION Right Groin Hematoma;  Surgeon: Rosetta Posner, MD;  Location: [place];  Service: Vascular;  Laterality: Right;  . NASAL ENDOSCOPY WITH EPISTAXIS CONTROL N/A 07/14/2014   Procedure: NASAL ENDOSCOPY WITH EPISTAXIS CONTROL;  Surgeon: Ruby Cola, MD;  Location: [place];  Service: ENT;  Laterality: N/A;  . NASAL ENDOSCOPY WITH EPISTAXIS CONTROL Bilateral 07/15/2014   Procedure: NASAL ENDOSCOPY WITH EPISTAXIS CONTROL;  Surgeon: Ruby Cola, MD;  Location: [place];  Service: ENT;  Laterality: Bilateral;  . PACEMAKER INSERTION  2010   implanted by Dr Doreatha Lew (MDT)  . PPM GENERATOR CHANGEOUT N/A 08/16/2019   Procedure: PPM GENERATOR CHANGEOUT;  Surgeon: Thompson Grayer, MD;  Location: [place]  [facility];  Service: Cardiovascular;  Laterality: N/A;  . RADIOLOGY WITH ANESTHESIA N/A 07/14/2014   Procedure: RADIOLOGY WITH ANESTHESIA;  Surgeon: Medication Radiologist, MD;  Location: [place];  Service: Radiology;  Laterality: N/A;  . TEE WITHOUT CARDIOVERSION N/A 07/01/2015   Procedure: TRANSESOPHAGEAL ECHOCARDIOGRAM (TEE);  Surgeon: Sueanne Margarita, MD;  Location: [facility];  Service: Cardiovascular;  Laterality: N/A;    ROS- all systems are reviewed and negative except as per HPI above  Current Outpatient Medications  Medication Sig Dispense Refill  . acetaminophen (TYLENOL) 500 MG tablet Take 500-1,000 mg by mouth every 6 (six) hours as needed for moderate pain or headache.    . Artificial Tear Ointment (DRY EYES OP) Place 1 drop into both eyes daily as needed (Dry eye).    . Ascorbic Acid (VITAMIN C) 1000 MG tablet Take 1,000 mg by mouth daily.      . Calcium-Vitamin D (CALTRATE 600 PLUS-VIT D PO) Take 1 tablet by mouth daily.    . cephALEXin (KEFLEX) 500 MG capsule Take 1 capsule (500 mg total) by mouth 4 (four) times daily. 20 capsule 0  . cholecalciferol (VITAMIN D) 1000 UNITS tablet Take 1,000 Units by mouth daily.      . Coenzyme Q10 (CO Q-10 PO) Take 1 tablet by mouth daily.    . COLLAGEN PO Take 2 Scoops by mouth daily.    . cromolyn (OPTICROM) 4 % ophthalmic solution 1 drop 4 (four) times daily.    Marland Kitchen  diltiazem (CARDIZEM CD) 360 MG 24 hr capsule Take 1 capsule (360 mg total) by mouth daily. 90 capsule 3  . diltiazem (CARDIZEM) 30 MG tablet Take 1/2 to 1 tablet every 4 hours AS NEEDED for heart rate >100 as long as top blood pressure >100. 45 tablet 1  . flecainide (TAMBOCOR) 50 MG tablet Take 1 tablet (50 mg total) by mouth 2 (two) times daily. 60 tablet 2  . levothyroxine (SYNTHROID) 50 MCG tablet Take 50 mcg by mouth once a week.    . levothyroxine (SYNTHROID) 75 MCG tablet Take 75 mcg by mouth daily before breakfast. 6 days a week    . Lysine 1000 MG TABS Take 1,000 mg by mouth  as needed.    . methocarbamol (ROBAXIN) 500 MG tablet Take 1 tablet (500 mg total) by mouth every 8 (eight) hours as needed for muscle spasms. 20 tablet 0  . methylcellulose (CITRUCEL) oral powder Take 2 packets by mouth daily.    . metoprolol succinate (TOPROL XL) 50 MG 24 hr tablet Take 75mg  TWICE A DAY 270 tablet 3  . Multiple Vitamins-Minerals (ICAPS AREDS 2 PO) Take 1 capsule by mouth daily.     . Omega-3 Fatty Acids (FISH OIL) 1200 MG CAPS Take 1,200 mg by mouth daily. Mega Red.    . polyethylene glycol powder (GLYCOLAX/MIRALAX) powder Take 0.5 Containers by mouth at bedtime.   0  . pravastatin (PRAVACHOL) 10 MG tablet Take 1 tablet by mouth once daily 90 tablet 3  . Probiotic Product (ALIGN PO) Take 1 capsule by mouth daily.    . sodium chloride (OCEAN) 0.65 % SOLN nasal spray Place 1 spray into both nostrils daily.    . temazepam (RESTORIL) 30 MG capsule Take 30 mg by mouth at bedtime.    Alveda Reasons 15 MG TABS tablet TAKE 1 TABLET BY MOUTH ONCE DAILY WITH SUPPER 90 tablet 1  . Zinc 50 MG CAPS Take 50 mg by mouth daily.      No current facility-administered medications for this visit.    Physical Exam: Vitals:   05/21/20 1602  BP: 112/66  Pulse: 75  SpO2: 97%  Weight: 129 lb 6.4 oz (58.7 kg)  Height: 5' (1.524 m)    GEN- The patient is well appearing, alert and oriented x 3 today.   Head- normocephalic, atraumatic Eyes-  Sclera clear, conjunctiva pink Ears- hearing intact Oropharynx- clear Lungs- Clear to ausculation bilaterally, normal work of breathing Chest- pacemaker pocket is well healed Heart- Regular rate and rhythm, no murmurs, rubs or gallops, PMI not laterally displaced GI- soft, NT, ND, + BS Extremities- no clubbing, cyanosis, or edema  Pacemaker interrogation- reviewed in detail today,  See PACEART report  ekg tracing ordered today is personally reviewed and shows sinus rhythm  Assessment and Plan:  1. Symptomatic sinus bradycardia  Normal pacemaker  function See Pace Art report No changes today she is not device dependant today  2. Paroxysmal atrial fibrillation afib burden is 3.4 % (previously <0.1%) chads2vasc score is 5.  She is on xarelto Doing well with flecainide. We will follow her closely on this medicine to avoid toxicity Amiodarone was discontinued due to abnormal LFTs  Could consider tikosyn if her arrhythmia burden increases. She has severe LA enlargement and will almost certainly have progression of her afib with time.  3. HTn Stable No change required today  Risks, benefits and potential toxicities for medications prescribed and/or refilled reviewed with patient today.   Return to see  EP PA every 6 months I will see when needed  Thompson Grayer MD, [facility] 05/21/2020 4:03 PM

## 2020-05-21 NOTE — Patient Instructions (Addendum)
Medication Instructions:  Your physician recommends that you continue on your current medications as directed. Please refer to the Current Medication list given to you today.  Labwork: None ordered.  Testing/Procedures: None ordered.  Follow-Up: Your physician wants you to follow-up in: 6 months with Thompson Grayer, MD or one of the following Advanced Practice Providers on your designated Care Team:      Tommye Standard, PA-C     You will receive a reminder letter in the mail two months in advance. If you don't receive a letter, please call our office to schedule the follow-up appointment.  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 This monitoring reduces the number of office visits required to check your device to one time per year. It allows [place] to keep an eye on the functioning of your device to ensure it is working properly. You are scheduled for a device check from home on 08/15/20. You may send your transmission at any time that day. If you have a wireless device, the transmission will be sent automatically. After your physician reviews your transmission, you will receive a postcard with your next transmission date.  Any Other Special Instructions Will Be Listed Below (If Applicable).  If you need a refill on your cardiac medications before your next appointment, please call your pharmacy.

## 2020-05-21 NOTE — Telephone Encounter (Signed)
My Chart message sent to patient for lab reminder.

## 2020-05-21 NOTE — Telephone Encounter (Signed)
-----   Message from Yevette Edwards, RN sent at 05/14/2020  9:56 AM EDT ----- Regarding: Labs Repeat BMET. Order in epic.

## 2020-05-27 ENCOUNTER — Other Ambulatory Visit: Payer: Self-pay | Admitting: Student

## 2020-05-27 DIAGNOSIS — I480 Paroxysmal atrial fibrillation: Secondary | ICD-10-CM

## 2020-05-29 NOTE — Progress Notes (Signed)
Remote pacemaker transmission.   

## 2020-05-30 ENCOUNTER — Ambulatory Visit (AMBULATORY_SURGERY_CENTER): Payer: Medicare Other | Admitting: Internal Medicine

## 2020-05-30 ENCOUNTER — Other Ambulatory Visit: Payer: Self-pay

## 2020-05-30 ENCOUNTER — Encounter: Payer: Self-pay | Admitting: Internal Medicine

## 2020-05-30 VITALS — BP 110/60 | HR 63 | Temp 97.7°F | Resp 16 | Ht 60.0 in | Wt 127.0 lb

## 2020-05-30 DIAGNOSIS — K5730 Diverticulosis of large intestine without perforation or abscess without bleeding: Secondary | ICD-10-CM

## 2020-05-30 DIAGNOSIS — Z86010 Personal history of colonic polyps: Secondary | ICD-10-CM

## 2020-05-30 DIAGNOSIS — R194 Change in bowel habit: Secondary | ICD-10-CM

## 2020-05-30 DIAGNOSIS — K624 Stenosis of anus and rectum: Secondary | ICD-10-CM

## 2020-05-30 DIAGNOSIS — R159 Full incontinence of feces: Secondary | ICD-10-CM

## 2020-05-30 MED ORDER — SODIUM CHLORIDE 0.9 % IV SOLN: 500.0000 mL | Freq: Once | INTRAVENOUS | Status: DC

## 2020-05-30 NOTE — Op Note (Signed)
[facility] Patient Name: Kathryn Gibson Procedure Date: 05/30/2020 8:08 AM MRN: [id number] Endoscopist: Docia Chuck. Henrene Pastor , MD Age: 87 Referring MD:  Date of Birth: 1933/12/31 Gender: Female Account #: [id number] Procedure:                Colonoscopy Indications:              Change in bowel habits, Constipation, bloating.                            History of adenomatous polyps including high-grade                            dysplasia. Previous examinations 2006, 2013, 2016 Medicines:                Monitored Anesthesia Care Procedure:                Pre-Anesthesia Assessment:                           - Prior to the procedure, a History and Physical                            was performed, and patient medications and                            allergies were reviewed. The patient's tolerance of                            previous anesthesia was also reviewed. The risks                            and benefits of the procedure and the sedation                            options and risks were discussed with the patient.                            All questions were answered, and informed consent                            was obtained. Prior Anticoagulants: The patient has                            taken Xarelto (rivaroxaban), last dose was 2 days                            prior to procedure. ASA Grade Assessment: III - A                            patient with severe systemic disease. After                            reviewing the risks and benefits, the patient was  deemed in satisfactory condition to undergo the                            procedure.                           After obtaining informed consent, the colonoscope                            was passed under direct vision. Throughout the                            procedure, the patient's blood pressure, pulse, and                            oxygen saturations were monitored continuously.  The                            Olympus PCF-[id number] (#[id number]) Colonoscope was                            introduced through the anus and advanced to the the                            cecum, identified by appendiceal orifice and                            ileocecal valve. The ileocecal valve, appendiceal                            orifice, and rectum were photographed. The quality                            of the bowel preparation was excellent. The                            colonoscopy was performed without difficulty. The                            patient tolerated the procedure well. The bowel                            preparation used was SUPREP via split dose                            instruction. Scope In: 8:11:56 AM Scope Out: 8:27:48 AM Scope Withdrawal Time: 0 hours 7 minutes 24 seconds  Total Procedure Duration: 0 hours 15 minutes 52 seconds  Findings:                 Multiple diverticula were found in the transverse                            colon and left colon.  Mild rectal stricture                           The exam was otherwise without abnormality on                            direct and retroflexion views. Complications:            No immediate complications. Estimated blood loss:                            None. Estimated Blood Loss:     Estimated blood loss: none. Impression:               - Diverticulosis in the transverse colon and in the                            left colon.                           - Mild rectal stricture.                           - The examination was otherwise normal on direct                            and retroflexion views.                           - No specimens collected. Recommendation:           - Repeat colonoscopy is not recommended for                            surveillance.                           - Resume Xarelto (rivaroxaban) today at prior dose.                           - Patient has a  contact number available for                            emergencies. The signs and symptoms of potential                            delayed complications were discussed with the                            patient. Return to normal activities tomorrow.                            Written discharge instructions were provided to the                            patient.                           -  Resume previous diet.                           - Continue present medications. Docia Chuck. Henrene Pastor, MD 05/30/2020 8:48:52 AM This report has been signed electronically.

## 2020-05-30 NOTE — Progress Notes (Signed)
pt tolerated well. VSS. awake and to recovery. Report given to RN.  

## 2020-05-30 NOTE — Progress Notes (Signed)
VS- Kathryn Gibson 

## 2020-05-30 NOTE — Patient Instructions (Addendum)
Thank you for allowing [place] to care for you today!   RESUME XARELTO  TODAY AT PRIOR DOSE.  Resume previous diet and other medications today  medications today.  Return to your normal daily activities tomorrow.     YOU HAD AN ENDOSCOPIC PROCEDURE TODAY AT [facility]:   Refer to the procedure report that was given to you for any specific questions about what was found during the examination.  If the procedure report does not answer your questions, please call your gastroenterologist to clarify.  If you requested that your care partner not be given the details of your procedure findings, then the procedure report has been included in a sealed envelope for you to review at your convenience later.  YOU SHOULD EXPECT: Some feelings of bloating in the abdomen. Passage of more gas than usual.  Walking can help get rid of the air that was put into your GI tract during the procedure and reduce the bloating. If you had a lower endoscopy (such as a colonoscopy or flexible sigmoidoscopy) you may notice spotting of blood in your stool or on the toilet paper. If you underwent a bowel prep for your procedure, you may not have a normal bowel movement for a few days.  Please Note:  You might notice some irritation and congestion in your nose or some drainage.  This is from the oxygen used during your procedure.  There is no need for concern and it should clear up in a day or so.  SYMPTOMS TO REPORT IMMEDIATELY:   Following lower endoscopy (colonoscopy or flexible sigmoidoscopy):  Excessive amounts of blood in the stool  Significant tenderness or worsening of abdominal pains  Swelling of the abdomen that is new, acute  Fever of 100F or higher    For urgent or emergent issues, a gastroenterologist can be reached at any hour by calling [phone]. Do not use MyChart messaging for urgent concerns.    DIET:  We do recommend a small meal at first, but then you may proceed to your  regular diet.  Drink plenty of fluids but you should avoid alcoholic beverages for 24 hours.  ACTIVITY:  You should plan to take it easy for the rest of today and you should NOT DRIVE or use heavy machinery until tomorrow (because of the sedation medicines used during the test).    FOLLOW UP: Our staff will call the number listed on your records 48-72 hours following your procedure to check on you and address any questions or concerns that you may have regarding the information given to you following your procedure. If we do not reach you, we will leave a message.  We will attempt to reach you two times.  During this call, we will ask if you have developed any symptoms of COVID 19. If you develop any symptoms (ie: fever, flu-like symptoms, shortness of breath, cough etc.) before then, please call [phone].  If you test positive for Covid 19 in the 2 weeks post procedure, please call and report this information to [place].    If any biopsies were taken you will be contacted by phone or by letter within the next 1-3 weeks.  Please call us at [phone] if you have not heard about the biopsies in 3 weeks.    SIGNATURES/CONFIDENTIALITY: You and/or your care partner have signed paperwork which will be entered into your electronic medical record.  These signatures attest to the fact that that the information above on your  After Visit Summary has been reviewed and is understood.  Full responsibility of the confidentiality of this discharge information lies with you and/or your care-partner.

## 2020-06-03 ENCOUNTER — Telehealth: Payer: Self-pay | Admitting: *Deleted

## 2020-06-03 DIAGNOSIS — M5459 Other low back pain: Secondary | ICD-10-CM | POA: Diagnosis not present

## 2020-06-03 NOTE — Telephone Encounter (Signed)
  Follow up Call-  Call back number 05/30/2020  Post procedure Call Back phone  # [phone]  Permission to leave phone message Yes  Some recent data might be hidden     Patient questions:  Do you have a fever, pain , or abdominal swelling? Yes.   Pain Score  0 *  Have you tolerated food without any problems? Yes.    Have you been able to return to your normal activities? Yes.    Do you have any questions about your discharge instructions: Diet   No. Medications  No. Follow up visit  No.  Do you have questions or concerns about your Care? No.  Actions: * If pain score is 4 or above: No action needed, pain <4.  1. Have you developed a fever since your procedure? no  2.   Have you had an respiratory symptoms (SOB or cough) since your procedure? no  3.   Have you tested positive for COVID 19 since your procedure no  4.   Have you had any family members/close contacts diagnosed with the COVID 19 since your procedure?  no   If yes to any of these questions please route to Joylene John, RN and Joella Prince, RN

## 2020-07-01 DIAGNOSIS — M545 Low back pain, unspecified: Secondary | ICD-10-CM | POA: Diagnosis not present

## 2020-07-07 ENCOUNTER — Other Ambulatory Visit: Payer: Self-pay | Admitting: Cardiovascular Disease

## 2020-07-08 ENCOUNTER — Encounter (HOSPITAL_BASED_OUTPATIENT_CLINIC_OR_DEPARTMENT_OTHER): Payer: Self-pay

## 2020-07-08 ENCOUNTER — Other Ambulatory Visit: Payer: Self-pay

## 2020-07-08 MED ORDER — RIVAROXABAN 15 MG PO TABS
15.0000 mg | ORAL_TABLET | Freq: Every day | ORAL | 1 refills | Status: DC
  Filled 2020-08-15: qty 90, 90d supply, fill #0

## 2020-07-08 NOTE — Telephone Encounter (Signed)
7f, 58.7kg, scr 0.8 05/13/20, ccr 47.6, lovw/allred 05/21/20

## 2020-07-08 NOTE — Telephone Encounter (Signed)
9f, 58.7kg, scr 0.8 05/13/20, lovw/allred 05/21/20, ccr 47.6

## 2020-07-11 DIAGNOSIS — I480 Paroxysmal atrial fibrillation: Secondary | ICD-10-CM | POA: Diagnosis not present

## 2020-07-11 DIAGNOSIS — K869 Disease of pancreas, unspecified: Secondary | ICD-10-CM | POA: Diagnosis not present

## 2020-07-11 DIAGNOSIS — E871 Hypo-osmolality and hyponatremia: Secondary | ICD-10-CM | POA: Diagnosis not present

## 2020-07-11 DIAGNOSIS — K5909 Other constipation: Secondary | ICD-10-CM | POA: Diagnosis not present

## 2020-07-29 DIAGNOSIS — M546 Pain in thoracic spine: Secondary | ICD-10-CM | POA: Diagnosis not present

## 2020-07-29 DIAGNOSIS — M25531 Pain in right wrist: Secondary | ICD-10-CM | POA: Diagnosis not present

## 2020-07-30 ENCOUNTER — Encounter: Payer: Medicare Other | Admitting: Gastroenterology

## 2020-08-11 ENCOUNTER — Encounter (HOSPITAL_BASED_OUTPATIENT_CLINIC_OR_DEPARTMENT_OTHER): Payer: Self-pay

## 2020-08-12 DIAGNOSIS — S22080A Wedge compression fracture of T11-T12 vertebra, initial encounter for closed fracture: Secondary | ICD-10-CM | POA: Diagnosis not present

## 2020-08-12 DIAGNOSIS — M8589 Other specified disorders of bone density and structure, multiple sites: Secondary | ICD-10-CM | POA: Diagnosis not present

## 2020-08-13 ENCOUNTER — Telehealth: Payer: Self-pay | Admitting: *Deleted

## 2020-08-13 NOTE — Telephone Encounter (Signed)
Below [employer] received from patient  [name] to [name], MD      6:52 PM I need to see Dr. Oval Linsey as soon as possible.  I have my regular appointment in October but feel I need to see her sooner.  I have recovered from a fracture in back (T12) but cannot walk very far and am short of breath.  I have always exercised but now I can only walk in pool and do some exercises and weights.  My problem is walking on land.  I plan to start back with Yoga next week to see if that will help but think in meantime I need to see Doctor R.  I live at [place] so I am nearby new office.  Thanks, Kathryn Gibson 20[birth year].  Had scheduled appointment with Overton Mam NP for tomorrow She wanted to wait for appointment with Dr Oval Linsey, explained to patient nothing available soon for visit. Encouraged her to come to visit tomorrow but stated time would not work for her. Advised nothing soon if unable to come tomorrow. Will send message to schedulers and request be put on cancellation list

## 2020-08-14 ENCOUNTER — Ambulatory Visit (HOSPITAL_BASED_OUTPATIENT_CLINIC_OR_DEPARTMENT_OTHER): Payer: Medicare Other | Admitting: Family

## 2020-08-14 ENCOUNTER — Other Ambulatory Visit (HOSPITAL_BASED_OUTPATIENT_CLINIC_OR_DEPARTMENT_OTHER): Payer: Self-pay

## 2020-08-14 MED ORDER — IRON (FERROUS SULFATE) 325 (65 FE) MG PO TABS
ORAL_TABLET | ORAL | 1 refills | Status: DC
  Filled 2020-08-14: qty 30, 30d supply, fill #0

## 2020-08-15 ENCOUNTER — Ambulatory Visit (INDEPENDENT_AMBULATORY_CARE_PROVIDER_SITE_OTHER): Payer: Medicare Other

## 2020-08-15 ENCOUNTER — Other Ambulatory Visit (HOSPITAL_BASED_OUTPATIENT_CLINIC_OR_DEPARTMENT_OTHER): Payer: Self-pay

## 2020-08-15 DIAGNOSIS — I495 Sick sinus syndrome: Secondary | ICD-10-CM | POA: Diagnosis not present

## 2020-08-15 MED ORDER — PRAVASTATIN SODIUM 10 MG PO TABS
ORAL_TABLET | ORAL | 3 refills | Status: DC
  Filled 2020-08-15 – 2020-10-21 (×2): qty 90, 90d supply, fill #0
  Filled 2021-01-20: qty 90, 90d supply, fill #1

## 2020-08-15 MED ORDER — RIVAROXABAN 15 MG PO TABS
ORAL_TABLET | ORAL | 1 refills | Status: DC
  Filled 2020-08-15: qty 90, 90d supply, fill #0

## 2020-08-15 MED ORDER — FLECAINIDE ACETATE 50 MG PO TABS
ORAL_TABLET | ORAL | 10 refills | Status: DC
  Filled 2020-08-15 – 2020-08-26 (×2): qty 60, 30d supply, fill #0
  Filled 2020-09-30: qty 60, 30d supply, fill #1
  Filled 2020-10-23: qty 60, 30d supply, fill #2
  Filled 2020-11-17: qty 60, 30d supply, fill #3
  Filled 2020-12-30: qty 60, 30d supply, fill #4
  Filled 2021-02-03: qty 60, 30d supply, fill #5
  Filled 2021-03-04: qty 60, 30d supply, fill #6
  Filled 2021-03-30: qty 60, 30d supply, fill #7
  Filled 2021-05-04: qty 60, 30d supply, fill #8

## 2020-08-15 MED FILL — Pravastatin Sodium Tab 10 MG: ORAL | 90 days supply | Qty: 90 | Fill #0 | Status: CN

## 2020-08-15 MED FILL — Flecainide Acetate Tab 50 MG: ORAL | 30 days supply | Qty: 60 | Fill #0 | Status: CN

## 2020-08-18 LAB — CUP PACEART REMOTE DEVICE CHECK
Battery Remaining Longevity: 145 mo
Battery Voltage: 3.08 V
Brady Statistic AP VP Percent: 25.31 %
Brady Statistic AP VS Percent: 1.72 %
Brady Statistic AS VP Percent: 1.49 %
Brady Statistic AS VS Percent: 71.47 %
Brady Statistic RA Percent Paced: 25.4 %
Brady Statistic RV Percent Paced: 27.52 %
Date Time Interrogation Session: 20220707202658
Implantable Lead Implant Date: 20101109
Implantable Lead Implant Date: 20101109
Implantable Lead Location: 753859
Implantable Lead Location: 753860
Implantable Lead Model: 4469
Implantable Lead Model: 4470
Implantable Lead Serial Number: 535369
Implantable Lead Serial Number: 657967
Implantable Pulse Generator Implant Date: 20210708
Lead Channel Impedance Value: 247 Ohm
Lead Channel Impedance Value: 399 Ohm
Lead Channel Impedance Value: 437 Ohm
Lead Channel Impedance Value: 437 Ohm
Lead Channel Pacing Threshold Amplitude: 0.625 V
Lead Channel Pacing Threshold Amplitude: 1 V
Lead Channel Pacing Threshold Pulse Width: 0.4 ms
Lead Channel Pacing Threshold Pulse Width: 0.4 ms
Lead Channel Sensing Intrinsic Amplitude: 0.25 mV
Lead Channel Sensing Intrinsic Amplitude: 0.25 mV
Lead Channel Sensing Intrinsic Amplitude: 10.875 mV
Lead Channel Sensing Intrinsic Amplitude: 10.875 mV
Lead Channel Setting Pacing Amplitude: 2 V
Lead Channel Setting Pacing Amplitude: 2.5 V
Lead Channel Setting Pacing Pulse Width: 0.4 ms
Lead Channel Setting Sensing Sensitivity: 1.2 mV

## 2020-08-26 ENCOUNTER — Other Ambulatory Visit (HOSPITAL_BASED_OUTPATIENT_CLINIC_OR_DEPARTMENT_OTHER): Payer: Self-pay

## 2020-08-28 ENCOUNTER — Other Ambulatory Visit (HOSPITAL_BASED_OUTPATIENT_CLINIC_OR_DEPARTMENT_OTHER): Payer: Self-pay

## 2020-09-01 ENCOUNTER — Other Ambulatory Visit (HOSPITAL_BASED_OUTPATIENT_CLINIC_OR_DEPARTMENT_OTHER): Payer: Self-pay

## 2020-09-01 MED ORDER — TEMAZEPAM 30 MG PO CAPS
ORAL_CAPSULE | ORAL | 1 refills | Status: DC
  Filled 2020-09-01: qty 90, 90d supply, fill #0
  Filled 2020-11-28: qty 90, 90d supply, fill #1

## 2020-09-02 ENCOUNTER — Other Ambulatory Visit (HOSPITAL_BASED_OUTPATIENT_CLINIC_OR_DEPARTMENT_OTHER): Payer: Self-pay

## 2020-09-03 ENCOUNTER — Ambulatory Visit: Payer: Medicare Other | Attending: Internal Medicine

## 2020-09-03 ENCOUNTER — Other Ambulatory Visit: Payer: Self-pay

## 2020-09-03 DIAGNOSIS — Z23 Encounter for immunization: Secondary | ICD-10-CM

## 2020-09-04 ENCOUNTER — Other Ambulatory Visit (HOSPITAL_BASED_OUTPATIENT_CLINIC_OR_DEPARTMENT_OTHER): Payer: Self-pay

## 2020-09-04 MED ORDER — COVID-19 MRNA VACC (MODERNA) 100 MCG/0.5ML IM SUSP
INTRAMUSCULAR | 0 refills | Status: DC
  Filled 2020-09-04: qty 0.25, 1d supply, fill #0

## 2020-09-04 NOTE — Progress Notes (Signed)
   Covid-19 Vaccination Clinic  Name:  Kathryn Gibson    MRN: [id number] DOB: Jan 06, 1934  09/04/2020  [name] was observed post Covid-19 immunization for 15 minutes without incident. She was provided with Vaccine Information Sheet and instruction to access the V-Safe system.   [name] was instructed to call 911 with any severe reactions post vaccine: Difficulty breathing  Swelling of face and throat  A fast heartbeat  A bad rash all over body  Dizziness and weakness   Immunizations Administered     Name Date Dose VIS Date Route   Moderna Covid-19 Booster Vaccine 09/03/2020  2:41 PM 0.25 mL 11/28/2019 Intramuscular   Manufacturer: Moderna   Lot: [id number]   [id number]

## 2020-09-05 NOTE — Progress Notes (Signed)
Remote pacemaker transmission.   

## 2020-09-22 ENCOUNTER — Encounter (HOSPITAL_BASED_OUTPATIENT_CLINIC_OR_DEPARTMENT_OTHER): Payer: Self-pay | Admitting: Cardiovascular Disease

## 2020-09-22 ENCOUNTER — Other Ambulatory Visit (HOSPITAL_BASED_OUTPATIENT_CLINIC_OR_DEPARTMENT_OTHER): Payer: Self-pay | Admitting: *Deleted

## 2020-09-22 ENCOUNTER — Other Ambulatory Visit (HOSPITAL_BASED_OUTPATIENT_CLINIC_OR_DEPARTMENT_OTHER): Payer: Self-pay

## 2020-09-22 ENCOUNTER — Ambulatory Visit (HOSPITAL_BASED_OUTPATIENT_CLINIC_OR_DEPARTMENT_OTHER): Payer: Medicare Other | Admitting: Cardiovascular Disease

## 2020-09-22 ENCOUNTER — Other Ambulatory Visit: Payer: Self-pay

## 2020-09-22 VITALS — BP 110/62 | HR 70 | Ht 60.0 in | Wt 130.8 lb

## 2020-09-22 DIAGNOSIS — R0789 Other chest pain: Secondary | ICD-10-CM | POA: Insufficient documentation

## 2020-09-22 DIAGNOSIS — R0600 Dyspnea, unspecified: Secondary | ICD-10-CM

## 2020-09-22 DIAGNOSIS — R600 Localized edema: Secondary | ICD-10-CM

## 2020-09-22 DIAGNOSIS — I482 Chronic atrial fibrillation, unspecified: Secondary | ICD-10-CM

## 2020-09-22 DIAGNOSIS — R0609 Other forms of dyspnea: Secondary | ICD-10-CM

## 2020-09-22 DIAGNOSIS — I10 Essential (primary) hypertension: Secondary | ICD-10-CM

## 2020-09-22 HISTORY — DX: Localized edema: R60.0

## 2020-09-22 HISTORY — DX: Other chest pain: R07.89

## 2020-09-22 MED ORDER — FUROSEMIDE 20 MG PO TABS
20.0000 mg | ORAL_TABLET | Freq: Every day | ORAL | 0 refills | Status: DC | PRN
  Filled 2020-09-22: qty 30, 30d supply, fill #0

## 2020-09-22 NOTE — Progress Notes (Signed)
Cardiology Office Note   Date:  09/22/2020   ID:  Kathryn Gibson, DOB 1933/09/10, MRN [id number]  PCP:  Prince Solian, MD  Cardiologist:   [name], MD  Electrophysiologist: Thompson Grayer  No chief complaint on file.    History of Present Illness: Kathryn Gibson is a 87 y.o. female with atrial fibrillation s/p ablation, SSS s/p PPM, asymptomatic coronary calcifications, and  hypertension, who presents for follow-up.  Kathryn Gibson  was previously a Gibson of Dr. Mare Ferrari.  Kathryn Gibson underwent ablation for atrial fibrillation on 07/01/15.  Kathryn Gibson last had an episode of atrial fibrillation 12/2015.  Kathryn Gibson has been doing well and rarely has palpitations that don't last very long.  Kathryn Gibson is nearing ERI and Kathryn Gibson PPM batter is followed monthly.  Kathryn Gibson continues to exercise nearly every day per week.  Kathryn Gibson has no exertional symptoms.  Kathryn Gibson has mild edema attributable to varicose veins that is better when Kathryn Gibson uses compression stockings.  Kathryn Gibson has no orthopnea or PND.  Amiodarone was previously increased due to atrial tachycardia.  This was discontinued due to elevated LFTs which subsequently normalized.  Kathryn Gibson saw Dr. Margaretann Loveless 07/2018 and reported exertional dyspnea.  Dr. Margaretann Loveless offered stress testing which Kathryn Gibson wanted to think about.  Kathryn Gibson device was last interrogated 01/08/19 and was nearing ERI.  There were no episodes of atrial fibrillation noted.   Kathryn Gibson generator was changed out 08/2019.  Afterward Kathryn Gibson reported feeling poorly and felt like Kathryn Gibson heart was racing, though is in sinus rhythm in Kathryn 90s.  Metoprolol was increased.  Kathryn Gibson last saw Dr. Rayann Heman on 05/2020 and was doing well.  Kathryn Gibson A. fib burden was at 3.4%.  On Kathryn Gibson last remote check 08/2020 it was 10.6%.   Kathryn Gibson has been struggling with pain from a T12 fracture.  Kathryn Gibson has discomfort when Kathryn Gibson walks on land but feels well with water exercises.  Kathryn Gibson does water walking for 30 minutes and water aerobics for 30 minutes and has no exertional chest pain or shortness of  breath.  However Kathryn Gibson sometimes notices tightness in Kathryn Gibson neck radiating into Kathryn Gibson jaws when Kathryn Gibson exercises.  Kathryn Gibson has noted some lower extremity edema that does not always improve with elevation of Kathryn Gibson legs.  Kathryn Gibson has no orthopnea or PND.  Kathryn Gibson has been working on Kathryn Gibson diet by eating lots of salmon.  Kathryn Gibson is happy that Kathryn Gibson total cholesterol reduced to 151 most recently.     Past Medical History:  Diagnosis Date   Arthritis    Atrial fibrillation ([place])    Atypical chest pain 09/22/2020   Blood transfusion without reported diagnosis    2017   Bradycardia    s/p PPM   Cataract    Colonic polyp    adenomatous   Constipation    Diverticulosis    Hemorrhoids    Hyperlipidemia    Hypertension    Hypothyroidism    Irritable bowel syndrome (IBS)    Lower extremity edema 09/22/2020   Osteoporosis    osteopenia   Pacemaker 2010   UTI (urinary tract infection)     Past Surgical History:  Procedure Laterality Date   BREAST CYST ASPIRATION Left 1980s   COLONOSCOPY     ELECTROPHYSIOLOGIC STUDY N/A 07/01/2015   Procedure: Atrial Fibrillation Ablation;  Surgeon: Thompson Grayer, MD;  Location: [facility];  Service: Cardiovascular;  Laterality: N/A;   EYE SURGERY     heart ablation     for atrial fib  HEMATOMA EVACUATION Right 07/15/2014   Procedure: EVACUATION Right Groin Hematoma;  Surgeon: Rosetta Posner, MD;  Location: [facility];  Service: Vascular;  Laterality: Right;   NASAL ENDOSCOPY WITH EPISTAXIS CONTROL N/A 07/14/2014   Procedure: NASAL ENDOSCOPY WITH EPISTAXIS CONTROL;  Surgeon: Ruby Cola, MD;  Location: [facility];  Service: ENT;  Laterality: N/A;   NASAL ENDOSCOPY WITH EPISTAXIS CONTROL Bilateral 07/15/2014   Procedure: NASAL ENDOSCOPY WITH EPISTAXIS CONTROL;  Surgeon: Ruby Cola, MD;  Location: [facility];  Service: ENT;  Laterality: Bilateral;   PACEMAKER INSERTION  2010   implanted by Dr Doreatha Lew (MDT)   [place] N/A 08/16/2019   Procedure: PPM GENERATOR CHANGEOUT;  Surgeon:  Thompson Grayer, MD;  Location: [facility];  Service: Cardiovascular;  Laterality: N/A;   RADIOLOGY WITH ANESTHESIA N/A 07/14/2014   Procedure: RADIOLOGY WITH ANESTHESIA;  Surgeon: Medication Radiologist, MD;  Location: [facility];  Service: Radiology;  Laterality: N/A;   TEE WITHOUT CARDIOVERSION N/A 07/01/2015   Procedure: TRANSESOPHAGEAL ECHOCARDIOGRAM (TEE);  Surgeon: Sueanne Margarita, MD;  Location[facility];  Service: Cardiovascular;  Laterality: N/A;     Current Outpatient Medications  Medication Sig Dispense Refill   acetaminophen (TYLENOL) 500 MG tablet Take 500-1,000 mg by mouth every 6 (six) hours as needed for moderate pain or headache.     Artificial Tear Ointment (DRY EYES OP) Place 1 drop into both eyes daily as needed (Dry eye).     Ascorbic Acid (VITAMIN C) 1000 MG tablet Take 1,000 mg by mouth daily.       cholecalciferol (VITAMIN D) 1000 UNITS tablet Take 1,000 Units by mouth daily.       Coenzyme Q10 (CO Q-10 PO) Take 1 tablet by mouth daily.     COVID-19 mRNA vaccine, Moderna, 100 MCG/0.5ML injection Inject into Kathryn muscle. 0.25 mL 0   cromolyn (OPTICROM) 4 % ophthalmic solution 1 drop 4 (four) times daily.     diltiazem (CARDIZEM CD) 360 MG 24 hr capsule Take 1 capsule (360 mg total) by mouth daily. 90 capsule 3   diltiazem (CARDIZEM) 30 MG tablet Take 1/2 to 1 tablet every 4 hours AS NEEDED for heart rate >100 as long as top blood pressure >100. 45 tablet 1   famotidine (PEPCID) 20 MG tablet Take 20 mg by mouth daily.     flecainide (TAMBOCOR) 50 MG tablet Take 1 tablet by mouth twice daily 60 tablet 10   furosemide (LASIX) 20 MG tablet Take 1 tablet (20 mg total) by mouth daily as needed. 30 tablet 0   levothyroxine (SYNTHROID) 50 MCG tablet Take 50 mcg by mouth once a week.     levothyroxine (SYNTHROID) 75 MCG tablet Take 75 mcg by mouth daily before breakfast. 6 days a week     methylcellulose (CITRUCEL) oral powder Take 2 packets by mouth daily.     metoprolol  succinate (TOPROL XL) 50 MG 24 hr tablet Take '75mg'$  TWICE A DAY 270 tablet 3   Multiple Vitamins-Minerals (ICAPS AREDS 2 PO) Take 1 capsule by mouth daily.      Omega-3 Fatty Acids (FISH OIL) 1200 MG CAPS Take 1,200 mg by mouth daily. Mega Red.     polyethylene glycol powder (GLYCOLAX/MIRALAX) powder Take 0.5 Containers by mouth at bedtime.   0   pravastatin (PRAVACHOL) 10 MG tablet Take 1 tablet by mouth once daily 90 tablet 3   Probiotic Product (ALIGN PO) Take 1 capsule by mouth daily.     Rivaroxaban (  XARELTO) 15 MG TABS tablet Take by mouth. 180 tablet 1   sodium chloride (OCEAN) 0.65 % SOLN nasal spray Place 1 spray into both nostrils daily.     temazepam (RESTORIL) 30 MG capsule Take 1 capsule by mouth once daily in Kathryn evening 90 capsule 1   Zinc 50 MG CAPS Take 50 mg by mouth daily.      Iron, Ferrous Sulfate, 325 (65 Fe) MG TABS 1 tablet Orally Once a day 30 day(s) (Gibson not taking: Reported on 09/22/2020) 30 tablet 1   No current facility-administered medications for this visit.    Allergies:   Trazodone and nefazodone    Social History:  Kathryn Gibson  reports that Kathryn Gibson quit smoking about 40 years ago. Kathryn Gibson smoking use included cigarettes. Kathryn Gibson has never used smokeless tobacco. Kathryn Gibson reports current alcohol use of about 7.0 standard drinks per week. Kathryn Gibson reports that Kathryn Gibson does not use drugs.   Family History:  Kathryn Gibson's family history includes CAD in Kathryn Gibson brother; Congestive Heart Failure in Kathryn Gibson mother; Dementia in Kathryn Gibson mother; Diabetes in Kathryn Gibson paternal grandfather; Heart attack (age of onset: 30) in Kathryn Gibson father; Hypertension in Kathryn Gibson mother; Leukemia in Kathryn Gibson daughter; Lung cancer in Kathryn Gibson son.    ROS:  Please see Kathryn history of present illness.   Otherwise, review of systems are positive for none.   All other systems are reviewed and negative.    PHYSICAL EXAM: VS:  BP 110/62   Pulse 70   Ht 5' (1.524 m)   Wt 130 lb 12.8 oz (59.3 kg)   BMI 25.55 kg/m  , BMI Body mass index is 25.55  kg/m. GENERAL:  Well appearing HEENT: Pupils equal round and reactive, fundi not visualized, oral mucosa unremarkable NECK:  No jugular venous distention, waveform within normal limits, carotid upstroke brisk and symmetric, no bruits CHEST: R upper chest PPM incision well-healed.  No erythema, tenderness or drainage LUNGS:  Clear to auscultation bilaterally HEART:  RRR.  PMI not displaced or sustained,S1 and S2 within normal limits, no S3, no S4, no clicks, no rubs, no murmurs ABD:  Flat, positive bowel sounds normal in frequency in pitch, no bruits, no rebound, no guarding, no midline pulsatile mass, no hepatomegaly, no splenomegaly EXT:  2 plus pulses throughout, n1+ bilateral LE edema to Kathryn ankles, no cyanosis no clubbing SKIN:  No rashes no nodules NEURO:  Cranial nerves II through XII grossly intact, motor grossly intact throughout PSYCH:  Cognitively intact, oriented to person place and time   EKG:  EKG is ordered today. 12/07/16: AP VS.  Rate 65 bpm.  Non-specific t wave abnormalities.   11/18/17: AP VS.  Prolonged AV delay.  Non-specific t wave abnormalities.  01/25/19: APVS.  Long AV delay.  Rate 61 bpm.   09/05/2019: Sinus rhythm.  Rate 91 bpm.  First-degree AV block.  Nonspecific T wave  abnormalities. 09/22/2020: Atrial fibrillation and a paced V pace.  Rate 70 bpm.  Inferolateral T wave inversions.  Echo 06/05/15: Study Conclusions   - Left ventricle: Kathryn cavity size was normal. Wall thickness was   normal. Systolic function was normal. Kathryn estimated ejection   fraction was in Kathryn range of 55% to 60%. Wall motion was normal;   there were no regional wall motion abnormalities. - Aortic valve: Trileaflet; mildly thickened, mildly calcified   leaflets. There was mild regurgitation. - Mitral valve: Calcified annulus. - Left atrium: Kathryn atrium was mildly dilated. - Right ventricle: Pacer wire or catheter noted  in right ventricle.   Impressions:   - Compared to Kathryn prior  study, there has been no significant   interval change.    Recent Labs: 10/09/2019: TSH 9.780 02/27/2020: Magnesium 2.2 05/13/2020: ALT 14; BUN 15; Creatinine, Ser 0.80; Hemoglobin 12.1; Platelets 300.0; Potassium 4.2; Sodium 133  02/28/15: TSH 7.24, free T4 130  04/02/16: Sodium 136, potassium 4.3, BUN 22, creatinine 0.8 WBC 6.91, hemoglobin, 12.9, hematocrit 41.2, platelets 208 Total cholesterol 163, triglycerides 45, HDL 70, LDL 84 TSH 2.12, free T4 1.3  03/23/2017:  Total cholesterol 161, triglycerides 46, HDL 73, LDL 79 ALT 25  Lipid Panel    Component Value Date/Time   CHOL 167 10/08/2015 0848   TRIG 56 10/08/2015 0848   HDL 94 10/08/2015 0848   CHOLHDL 1.8 10/08/2015 0848   VLDL 11 10/08/2015 0848   LDLCALC 62 10/08/2015 0848   05/09/2019: Total cholesterol 176, triglycerides 56, HDL 73, LDL 92    Wt Readings from Last 3 Encounters:  09/22/20 130 lb 12.8 oz (59.3 kg)  05/30/20 127 lb (57.6 kg)  05/21/20 129 lb 6.4 oz (58.7 kg)     ASSESSMENT AND PLAN:  # Atrial fibrillation:  Kathryn Gibson's atrial fibrillation burden seems to be increasing.  Most recently was 10.6%.  Part of Kathryn Gibson EKG was in A. fib and then Kathryn Gibson began atrial pacing.  Kathryn Gibson seems to be asymptomatic.  Continue diltiazem, metoprolol, flecainide, and Xarelto.  Kathryn Gibson sees Dr. Rayann Heman for Kathryn Gibson A. fib.  This patients CHA2DS2-VASc Score and unadjusted Ischemic Stroke Rate (% per year) is equal to 4.8 % stroke rate/year from a score of 4 Above score calculated as 1 point each if present [CHF, HTN, DM, Vascular=MI/PAD/Aortic Plaque, Age if 65-74, or Female] Above score calculated as 2 points each if present [Age > 75, or Stroke/TIA/TE]  # Hyperlipidemia: ASCVD 10 year risk is 34%.  Kathryn Gibson reports myalgias with rosuvastatin.  Kathryn Gibson is tolerating pravastatin.  Kathryn Gibson has been working on Kathryn Gibson diet and Kathryn Gibson total cholesterol reduced to 151 when last checked.  Continue pravastatin and fish oil.  #Exertional chest tightness and shortness  of breath: Kathryn Gibson has known coronary calcification from prior CT scans.  Kathryn Gibson is having some exertional symptoms which I suspect may be attributable to Kathryn Gibson T12 fracture as Kathryn Gibson does not feel them when Kathryn Gibson is in Kathryn water and it is mostly tightness radiating around Kathryn Gibson lower thorax and upper abdomen.  However, we will get a Lexiscan Myoview to ensure that Kathryn Gibson is not having ischemia.  Continue metoprolol and pravastatin.  Kathryn Gibson is not on aspirin given that Kathryn Gibson is on Xarelto.  # LE Edema:  Lately Kathryn Gibson has been experiencing more lower extremity edema.  Kathryn Gibson has no orthopnea or PND.  Recent echo was stable.  We will give Kathryn Gibson Lasix 20 mg to take as needed and make sure Kathryn Gibson elevates Kathryn Gibson legs when sitting.  Use compression socks as able.  Current medicines are reviewed at length with Kathryn Gibson today.  Kathryn Gibson does not have concerns regarding medicines.  Kathryn following changes have been made:  lasix prn    Labs/ tests ordered today include:   Orders Placed This Encounter  Procedures   MYOCARDIAL PERFUSION IMAGING   EKG 12-Lead     Disposition:   FU with Janssen Zee C. Oval Linsey, MD, [facility] in 6 months.      Signed, Albie Bazin C. Oval Linsey, MD, [facility]  09/22/2020 11:25 AM    [place]

## 2020-09-22 NOTE — Patient Instructions (Signed)
Medication Instructions:  START FUROSEMIDE 20 MG DAILY AS NEEDED   *If you need a refill on your cardiac medications before your next appointment, please call your pharmacy*  Lab Work: NONE   Testing/Procedures: Your physician has requested that you have a lexiscan myoview. For further information please visit [email or web address]. Please follow instruction sheet, as given.  Follow-Up: At [facility], you and your health needs are our priority.  As part of our continuing mission to provide you with exceptional heart care, we have created designated Provider Care Teams.  These Care Teams include your primary Cardiologist (physician) and Advanced Practice Providers (APPs -  Physician Assistants and Nurse Practitioners) who all work together to provide you with the care you need, when you need it.  We recommend signing up for the patient portal called "MyChart".  Sign up information is provided on this After Visit Summary.  MyChart is used to connect with patients for Virtual Visits (Telemedicine).  Patients are able to view lab/test results, encounter notes, upcoming appointments, etc.  Non-urgent messages can be sent to your provider as well.   To learn more about what you can do with MyChart, go to [email or web address].    Your next appointment:   6 month(s)  The format for your next appointment:   In Person  Provider:   [name], MD or Laurann Montana, NP

## 2020-09-24 ENCOUNTER — Telehealth (HOSPITAL_COMMUNITY): Payer: Self-pay | Admitting: *Deleted

## 2020-09-24 DIAGNOSIS — I872 Venous insufficiency (chronic) (peripheral): Secondary | ICD-10-CM | POA: Diagnosis not present

## 2020-09-24 DIAGNOSIS — I480 Paroxysmal atrial fibrillation: Secondary | ICD-10-CM | POA: Diagnosis not present

## 2020-09-24 DIAGNOSIS — D649 Anemia, unspecified: Secondary | ICD-10-CM | POA: Diagnosis not present

## 2020-09-24 DIAGNOSIS — I2510 Atherosclerotic heart disease of native coronary artery without angina pectoris: Secondary | ICD-10-CM | POA: Diagnosis not present

## 2020-09-24 NOTE — Telephone Encounter (Signed)
Close encounter 

## 2020-09-25 ENCOUNTER — Ambulatory Visit (HOSPITAL_BASED_OUTPATIENT_CLINIC_OR_DEPARTMENT_OTHER): Payer: Medicare Other | Admitting: Cardiovascular Disease

## 2020-09-26 ENCOUNTER — Ambulatory Visit (HOSPITAL_COMMUNITY)
Admission: RE | Admit: 2020-09-26 | Discharge: 2020-09-26 | Disposition: A | Discharge status: Home / Self Care | Payer: Medicare Other | Source: Ambulatory Visit | Attending: Cardiovascular Disease | Admitting: Cardiovascular Disease

## 2020-09-26 ENCOUNTER — Other Ambulatory Visit: Payer: Self-pay

## 2020-09-26 DIAGNOSIS — R0600 Dyspnea, unspecified: Secondary | ICD-10-CM | POA: Diagnosis not present

## 2020-09-26 DIAGNOSIS — R0609 Other forms of dyspnea: Secondary | ICD-10-CM

## 2020-09-26 LAB — MYOCARDIAL PERFUSION IMAGING
Peak HR: 60 {beats}/min
Rest HR: 61 {beats}/min
SDS: 0
SRS: 0
SSS: 0
TID: 1.11

## 2020-09-26 MED ORDER — TECHNETIUM TC 99M TETROFOSMIN IV KIT
10.8000 | PACK | Freq: Once | INTRAVENOUS | Status: AC | PRN
  Administered 2020-09-26: 10.8 via INTRAVENOUS
  Filled 2020-09-26: qty 11

## 2020-09-26 MED ORDER — TECHNETIUM TC 99M TETROFOSMIN IV KIT
31.5000 | PACK | Freq: Once | INTRAVENOUS | Status: AC | PRN
  Administered 2020-09-26: 31.5 via INTRAVENOUS
  Filled 2020-09-26: qty 32

## 2020-09-26 MED ORDER — REGADENOSON 0.4 MG/5ML IV SOLN
0.4000 mg | Freq: Once | INTRAVENOUS | Status: AC
  Administered 2020-09-26: 0.4 mg via INTRAVENOUS

## 2020-09-30 ENCOUNTER — Other Ambulatory Visit (HOSPITAL_BASED_OUTPATIENT_CLINIC_OR_DEPARTMENT_OTHER): Payer: Self-pay

## 2020-09-30 ENCOUNTER — Encounter (HOSPITAL_BASED_OUTPATIENT_CLINIC_OR_DEPARTMENT_OTHER): Payer: Self-pay

## 2020-09-30 ENCOUNTER — Other Ambulatory Visit: Payer: Self-pay

## 2020-09-30 MED ORDER — RIVAROXABAN 15 MG PO TABS
15.0000 mg | ORAL_TABLET | Freq: Every day | ORAL | 1 refills | Status: DC
  Filled 2020-09-30 – 2021-03-03 (×2): qty 30, 30d supply, fill #0

## 2020-09-30 MED ORDER — RIVAROXABAN 15 MG PO TABS
15.0000 mg | ORAL_TABLET | Freq: Every day | ORAL | 1 refills | Status: DC
  Filled 2020-09-30: qty 90, 90d supply, fill #0

## 2020-09-30 NOTE — Telephone Encounter (Signed)
Refill sent for xarelto '15mg'$  qd

## 2020-09-30 NOTE — Addendum Note (Signed)
Addended by: Allean Found on: 09/30/2020 09:24 AM   Modules accepted: Orders

## 2020-09-30 NOTE — Telephone Encounter (Signed)
Prescription refill request for Xarelto received.  Indication:afib Last office visit:[place] 09/22/20 Weight:59.5kg Age:40fScr:0.8 05/13/20 CrCl:56.8

## 2020-10-01 ENCOUNTER — Other Ambulatory Visit (HOSPITAL_BASED_OUTPATIENT_CLINIC_OR_DEPARTMENT_OTHER): Payer: Self-pay

## 2020-10-02 ENCOUNTER — Other Ambulatory Visit (HOSPITAL_BASED_OUTPATIENT_CLINIC_OR_DEPARTMENT_OTHER): Payer: Self-pay

## 2020-10-03 ENCOUNTER — Other Ambulatory Visit (HOSPITAL_BASED_OUTPATIENT_CLINIC_OR_DEPARTMENT_OTHER): Payer: Self-pay

## 2020-10-03 MED ORDER — LEVOTHYROXINE SODIUM 50 MCG PO TABS
ORAL_TABLET | ORAL | 3 refills | Status: DC
  Filled 2020-10-03: qty 12, 84d supply, fill #0
  Filled 2021-02-16: qty 12, 84d supply, fill #1

## 2020-10-03 MED ORDER — LEVOTHYROXINE SODIUM 75 MCG PO TABS
ORAL_TABLET | ORAL | 3 refills | Status: DC
  Filled 2020-10-03: qty 78, 90d supply, fill #0
  Filled 2021-01-05: qty 78, 90d supply, fill #1
  Filled 2021-04-15: qty 12, 14d supply, fill #2
  Filled 2021-04-17: qty 15, 14d supply, fill #2

## 2020-10-10 ENCOUNTER — Other Ambulatory Visit (HOSPITAL_BASED_OUTPATIENT_CLINIC_OR_DEPARTMENT_OTHER): Payer: Self-pay

## 2020-10-21 ENCOUNTER — Other Ambulatory Visit (HOSPITAL_BASED_OUTPATIENT_CLINIC_OR_DEPARTMENT_OTHER): Payer: Self-pay

## 2020-10-22 ENCOUNTER — Other Ambulatory Visit (HOSPITAL_BASED_OUTPATIENT_CLINIC_OR_DEPARTMENT_OTHER): Payer: Self-pay

## 2020-10-22 DIAGNOSIS — M25531 Pain in right wrist: Secondary | ICD-10-CM | POA: Diagnosis not present

## 2020-10-22 DIAGNOSIS — M654 Radial styloid tenosynovitis [de Quervain]: Secondary | ICD-10-CM | POA: Diagnosis not present

## 2020-10-23 ENCOUNTER — Other Ambulatory Visit (HOSPITAL_BASED_OUTPATIENT_CLINIC_OR_DEPARTMENT_OTHER): Payer: Self-pay

## 2020-11-03 ENCOUNTER — Other Ambulatory Visit (HOSPITAL_BASED_OUTPATIENT_CLINIC_OR_DEPARTMENT_OTHER): Payer: Self-pay

## 2020-11-03 MED ORDER — FLUAD QUADRIVALENT 0.5 ML IM PRSY
PREFILLED_SYRINGE | INTRAMUSCULAR | 0 refills | Status: DC
  Filled 2020-11-03: qty 0.5, 1d supply, fill #0

## 2020-11-14 ENCOUNTER — Ambulatory Visit (INDEPENDENT_AMBULATORY_CARE_PROVIDER_SITE_OTHER): Payer: Medicare Other

## 2020-11-14 DIAGNOSIS — I495 Sick sinus syndrome: Secondary | ICD-10-CM | POA: Diagnosis not present

## 2020-11-17 ENCOUNTER — Other Ambulatory Visit (HOSPITAL_BASED_OUTPATIENT_CLINIC_OR_DEPARTMENT_OTHER): Payer: Self-pay

## 2020-11-17 ENCOUNTER — Other Ambulatory Visit (HOSPITAL_BASED_OUTPATIENT_CLINIC_OR_DEPARTMENT_OTHER): Payer: Self-pay | Admitting: Cardiovascular Disease

## 2020-11-17 LAB — CUP PACEART REMOTE DEVICE CHECK
Battery Remaining Longevity: 139 mo
Battery Voltage: 3.04 V
Brady Statistic AP VP Percent: 64.25 %
Brady Statistic AP VS Percent: 1.9 %
Brady Statistic AS VP Percent: 1.22 %
Brady Statistic AS VS Percent: 32.62 %
Brady Statistic RA Percent Paced: 65.9 %
Brady Statistic RV Percent Paced: 65.35 %
Date Time Interrogation Session: 20221007104506
Implantable Lead Implant Date: 20101109
Implantable Lead Implant Date: 20101109
Implantable Lead Location: 753859
Implantable Lead Location: 753860
Implantable Lead Model: 4469
Implantable Lead Model: 4470
Implantable Lead Serial Number: 535369
Implantable Lead Serial Number: 657967
Implantable Pulse Generator Implant Date: 20210708
Lead Channel Impedance Value: 285 Ohm
Lead Channel Impedance Value: 437 Ohm
Lead Channel Impedance Value: 437 Ohm
Lead Channel Impedance Value: 456 Ohm
Lead Channel Pacing Threshold Amplitude: 0.625 V
Lead Channel Pacing Threshold Amplitude: 1 V
Lead Channel Pacing Threshold Pulse Width: 0.4 ms
Lead Channel Pacing Threshold Pulse Width: 0.4 ms
Lead Channel Sensing Intrinsic Amplitude: 0.375 mV
Lead Channel Sensing Intrinsic Amplitude: 0.375 mV
Lead Channel Sensing Intrinsic Amplitude: 10.75 mV
Lead Channel Sensing Intrinsic Amplitude: 10.75 mV
Lead Channel Setting Pacing Amplitude: 2 V
Lead Channel Setting Pacing Amplitude: 2.5 V
Lead Channel Setting Pacing Pulse Width: 0.4 ms
Lead Channel Setting Sensing Sensitivity: 1.2 mV

## 2020-11-17 MED ORDER — FUROSEMIDE 20 MG PO TABS
20.0000 mg | ORAL_TABLET | Freq: Every day | ORAL | 5 refills | Status: DC | PRN
  Filled 2020-11-17: qty 90, 90d supply, fill #0
  Filled 2021-05-04: qty 90, 90d supply, fill #1

## 2020-11-17 NOTE — Telephone Encounter (Signed)
Rx(s) sent to pharmacy electronically.  

## 2020-11-18 ENCOUNTER — Other Ambulatory Visit (HOSPITAL_BASED_OUTPATIENT_CLINIC_OR_DEPARTMENT_OTHER): Payer: Self-pay

## 2020-11-24 NOTE — Progress Notes (Signed)
Remote pacemaker transmission.   

## 2020-11-25 ENCOUNTER — Other Ambulatory Visit (HOSPITAL_BASED_OUTPATIENT_CLINIC_OR_DEPARTMENT_OTHER): Payer: Self-pay

## 2020-11-26 ENCOUNTER — Encounter: Payer: Medicare Other | Admitting: Physician Assistant

## 2020-11-26 ENCOUNTER — Other Ambulatory Visit (HOSPITAL_BASED_OUTPATIENT_CLINIC_OR_DEPARTMENT_OTHER): Payer: Self-pay

## 2020-11-26 DIAGNOSIS — D649 Anemia, unspecified: Secondary | ICD-10-CM | POA: Diagnosis not present

## 2020-11-26 DIAGNOSIS — I872 Venous insufficiency (chronic) (peripheral): Secondary | ICD-10-CM | POA: Diagnosis not present

## 2020-11-26 DIAGNOSIS — I2510 Atherosclerotic heart disease of native coronary artery without angina pectoris: Secondary | ICD-10-CM | POA: Diagnosis not present

## 2020-11-26 DIAGNOSIS — I480 Paroxysmal atrial fibrillation: Secondary | ICD-10-CM | POA: Diagnosis not present

## 2020-11-27 ENCOUNTER — Other Ambulatory Visit: Payer: Self-pay

## 2020-11-27 ENCOUNTER — Ambulatory Visit (INDEPENDENT_AMBULATORY_CARE_PROVIDER_SITE_OTHER): Payer: Medicare Other | Admitting: Physician Assistant

## 2020-11-27 ENCOUNTER — Encounter: Payer: Self-pay | Admitting: Physician Assistant

## 2020-11-27 VITALS — BP 110/62 | HR 68 | Ht 60.0 in | Wt 128.8 lb

## 2020-11-27 DIAGNOSIS — I351 Nonrheumatic aortic (valve) insufficiency: Secondary | ICD-10-CM

## 2020-11-27 DIAGNOSIS — I480 Paroxysmal atrial fibrillation: Secondary | ICD-10-CM | POA: Diagnosis not present

## 2020-11-27 DIAGNOSIS — I10 Essential (primary) hypertension: Secondary | ICD-10-CM | POA: Diagnosis not present

## 2020-11-27 DIAGNOSIS — Z950 Presence of cardiac pacemaker: Secondary | ICD-10-CM

## 2020-11-27 LAB — CUP PACEART INCLINIC DEVICE CHECK
Battery Remaining Longevity: 139 mo
Battery Voltage: 3.03 V
Brady Statistic AP VP Percent: 48.68 %
Brady Statistic AP VS Percent: 1.74 %
Brady Statistic AS VP Percent: 1.33 %
Brady Statistic AS VS Percent: 48.26 %
Brady Statistic RA Percent Paced: 48.52 %
Brady Statistic RV Percent Paced: 49.22 %
Date Time Interrogation Session: 20221020160327
Implantable Lead Implant Date: 20101109
Implantable Lead Implant Date: 20101109
Implantable Lead Location: 753859
Implantable Lead Location: 753860
Implantable Lead Model: 4469
Implantable Lead Model: 4470
Implantable Lead Serial Number: 535369
Implantable Lead Serial Number: 657967
Implantable Pulse Generator Implant Date: 20210708
Lead Channel Impedance Value: 285 Ohm
Lead Channel Impedance Value: 437 Ohm
Lead Channel Impedance Value: 456 Ohm
Lead Channel Impedance Value: 475 Ohm
Lead Channel Pacing Threshold Amplitude: 0.5 V
Lead Channel Pacing Threshold Amplitude: 1 V
Lead Channel Pacing Threshold Pulse Width: 0.4 ms
Lead Channel Pacing Threshold Pulse Width: 0.4 ms
Lead Channel Sensing Intrinsic Amplitude: 0.25 mV
Lead Channel Sensing Intrinsic Amplitude: 0.5 mV
Lead Channel Sensing Intrinsic Amplitude: 10.625 mV
Lead Channel Sensing Intrinsic Amplitude: 12.75 mV
Lead Channel Setting Pacing Amplitude: 2 V
Lead Channel Setting Pacing Amplitude: 2.5 V
Lead Channel Setting Pacing Pulse Width: 0.4 ms
Lead Channel Setting Sensing Sensitivity: 1.2 mV

## 2020-11-27 NOTE — Patient Instructions (Signed)
Medication Instructions:   Your physician recommends that you continue on your current medications as directed. Please refer to the Current Medication list given to you today.   *If you need a refill on your cardiac medications before your next appointment, please call your pharmacy*   Lab Work: [place]   If you have labs (blood work) drawn today and your tests are completely normal, you will receive your results only by: [place] (if you have MyChart) OR A paper copy in the mail If you have any lab test that is abnormal or we need to change your treatment, we will call you to review the results.   Testing/Procedures: NONE ORDERED  TODAY   Follow-Up: At [facility] you and your health needs are our priority.  As part of our continuing mission to provide you with exceptional heart care, we have created designated Provider Care Teams.  These Care Teams include your primary Cardiologist (physician) and Advanced Practice Providers (APPs -  Physician Assistants and Nurse Practitioners) who all work together to provide you with the care you need, when you need it.  We recommend signing up for the patient portal called "MyChart".  Sign up information is provided on this After Visit Summary.  MyChart is used to connect with patients for Virtual Visits (Telemedicine).  Patients are able to view lab/test results, encounter notes, upcoming appointments, etc.  Non-urgent messages can be sent to your provider as well.   To learn more about what you can do with MyChart, go to [email or web address].    Your next appointment:   6 month(s)  The format for your next appointment:   In Person  Provider:   You will see one of the following Advanced Practice Providers on your designated Care Team:   Tommye Standard, [place] Legrand Como "Jonni Sanger" Chalmers Cater, [place]     Other Instructions

## 2020-11-27 NOTE — Progress Notes (Signed)
Cardiology Office Note Date:  11/27/2020  Patient ID:  Kathryn Gibson, Kathryn Gibson September 26, 1933, MRN [id number] PCP:  Prince Solian, MD  Cardiologist:  Dr. Oval Linsey Electrophysiologist: Dr. Rayann Heman    Chief Complaint:  6 mo  History of Present Illness: Kathryn Gibson is a 87 y.o. female with history of AFib, bradycardia w/PPM, HTN, hypothyroidism.  Feb 2021 she had an Afib  Clinic and cardiology visit with some subjective feelings of increased pulse, unusual DOE, fatiigue, device checked without arrhythmia, was nearing ERI.  She was offered to get some w/u done for this though looks like she wanted to hold off.  I saw her feb 2021 She is doing OK.  She lives at [facility] and with the onset of COVID restrictions, she was spending all of her time in her home, unable to be out and about, all her usual exercise activities stopped including pool, yoga, everything.  She has recently been back at some exercise and feels like she gets winded much quicker and more fatigued afterwards.  She feels like she has good exertion in the pool, but other activities not as much.  She can walk up the stairs too her apartmentand does not have to stop, but is winded.  No CP, notices her heart gets faster then it did before with exercise.   No dizzy spells, near syncope or syncope. She denies any bleeding or signs of bleeding She has labs regularly with her PMD, mentions that her last labs told her kidney and liver function was a little abnormal and scheduled for f/u labs on Friday.  She was not told to stop or change any of her medicines. I suspected deconditioning with reduced activity with COVID restrictions at the senior living center, no changes were made with plans to monitor symptoms as her activties increased back to her usual. Her device was nearing ERI  She has seen Dr. Rayann Heman and cardiology service a number of times since then.  She last saw Dr. Rayann Heman April 2022, c/o chronic back pain that has limited her  ability to do things, AF burden 3.4% up for her since on Flecainide, with severe LA enlargement predicted she would have progression of her AF burden.  More recently she saw Dr. Oval Linsey, Aug 2022, doing water aerobics daily though walking  on land was hard and difficult with her back.  Mentioned some neck tightness to jaw with her exercising at times.  Some edema aslo noted by the patient, planned for stress testing, lasix PRN was added to her regime.  Stress test was normal.  TODAY She continues to do well. Limited by her back and this frustrates her, but generally feeling well Continues with her water aerobics and denies any CP or SOB, no neck symptoms with exertion or t rest. She does have palpitations, they wax and wane, will go svereal days with none then have days with intermittent brief episodes. Nothing that lasts more then a few monutes and ususaly feel much shorted. She has only once taken a PRN dilt  No dizzy spells, near syncope or syncope.  No bleeding or signs of bleeding  Saw her PMD yesterday and had labs done     AFib hx 07/01/2015: PVI ablation, including Mitral isthmus and Cavo-tricuspid isthmus ablation noting Multiple atypical atrial flutter circuits observed too unstable for mapping  Has had an ATach AAD  Amiodarone goes back at least to 2012 (as far as I can find notes), remains active drug for her >>> STOPPED 2/2  abn LFTs (?uncertain, looks sometime early 2022 Flecainide started Jan 2022  Device information  MDT dual chamber PPM implanted 12/17/2008, gen change 08/16/2019  Past Medical History:  Diagnosis Date   Arthritis    Atrial fibrillation ([place])    Atypical chest pain 09/22/2020   Blood transfusion without reported diagnosis    2017   Bradycardia    s/p PPM   Cataract    Colonic polyp    adenomatous   Constipation    Diverticulosis    Hemorrhoids    Hyperlipidemia    Hypertension    Hypothyroidism    Irritable bowel syndrome (IBS)    Lower  extremity edema 09/22/2020   Osteoporosis    osteopenia   Pacemaker 2010   UTI (urinary tract infection)     Past Surgical History:  Procedure Laterality Date   BREAST CYST ASPIRATION Left 1980s   COLONOSCOPY     ELECTROPHYSIOLOGIC STUDY N/A 07/01/2015   Procedure: Atrial Fibrillation Ablation;  Surgeon: Thompson Grayer, MD;  Location: [facility];  Service: Cardiovascular;  Laterality: N/A;   EYE SURGERY     heart ablation     for atrial fib   HEMATOMA EVACUATION Right 07/15/2014   Procedure: EVACUATION Right Groin Hematoma;  Surgeon: Rosetta Posner, MD;  Location: [facility];  Service: Vascular;  Laterality: Right;   NASAL ENDOSCOPY WITH EPISTAXIS CONTROL N/A 07/14/2014   Procedure: NASAL ENDOSCOPY WITH EPISTAXIS CONTROL;  Surgeon: Ruby Cola, MD;  Location: [facility];  Service: ENT;  Laterality: N/A;   NASAL ENDOSCOPY WITH EPISTAXIS CONTROL Bilateral 07/15/2014   Procedure: NASAL ENDOSCOPY WITH EPISTAXIS CONTROL;  Surgeon: Ruby Cola, MD;  Location: [facility];  Service: ENT;  Laterality: Bilateral;   PACEMAKER INSERTION  2010   implanted by Dr Doreatha Lew (MDT)   [place] N/A 08/16/2019   Procedure: PPM GENERATOR CHANGEOUT;  Surgeon: Thompson Grayer, MD;  Location: [facility];  Service: Cardiovascular;  Laterality: N/A;   RADIOLOGY WITH ANESTHESIA N/A 07/14/2014   Procedure: RADIOLOGY WITH ANESTHESIA;  Surgeon: Medication Radiologist, MD;  Location: [facility];  Service: Radiology;  Laterality: N/A;   TEE WITHOUT CARDIOVERSION N/A 07/01/2015   Procedure: TRANSESOPHAGEAL ECHOCARDIOGRAM (TEE);  Surgeon: Sueanne Margarita, MD;  [facility];  Service: Cardiovascular;  Laterality: N/A;    Current Outpatient Medications  Medication Sig Dispense Refill   acetaminophen (TYLENOL) 500 MG tablet Take 500-1,000 mg by mouth every 6 (six) hours as needed for moderate pain or headache.     Artificial Tear Ointment (DRY EYES OP) Place 1 drop into both eyes daily as needed (Dry eye).      Ascorbic Acid (VITAMIN C) 1000 MG tablet Take 1,000 mg by mouth daily.       cholecalciferol (VITAMIN D) 1000 UNITS tablet Take 1,000 Units by mouth daily.       Coenzyme Q10 (CO Q-10 PO) Take 1 tablet by mouth daily.     COVID-19 mRNA vaccine, Moderna, 100 MCG/0.5ML injection Inject into the muscle. 0.25 mL 0   diltiazem (CARDIZEM CD) 360 MG 24 hr capsule Take 1 capsule (360 mg total) by mouth daily. 90 capsule 3   diltiazem (CARDIZEM) 30 MG tablet Take 1/2 to 1 tablet every 4 hours AS NEEDED for heart rate >100 as long as top blood pressure >100. 45 tablet 1   famotidine (PEPCID) 20 MG tablet Take 20 mg by mouth daily.     flecainide (TAMBOCOR) 50 MG tablet Take 1 tablet by  mouth twice daily 60 tablet 10   furosemide (LASIX) 20 MG tablet Take 1 tablet (20 mg total) by mouth daily as needed. 30 tablet 5   influenza vaccine adjuvanted (FLUAD QUADRIVALENT) 0.5 ML injection Inject into the muscle. 0.5 mL 0   levothyroxine (SYNTHROID) 50 MCG tablet TAKE 1 TABLET BY MOUTH ONCE A WEEK  WITH THE 75 MCG STRENGTH 6 DAYS A WK Oral 30 days (Patient taking differently: Take 50 mcg by mouth once a week.) 12 tablet 3   levothyroxine (SYNTHROID) 75 MCG tablet TAKE 75 MCG BY MOUTH FOR 6 DAYS A WEEK (TAKE 50 MCG THE REMAINING 1 DAY OF THE WEEK). Oral 90 days 78 tablet 3   methylcellulose (CITRUCEL) oral powder Take 2 packets by mouth daily.     metoprolol succinate (TOPROL XL) 50 MG 24 hr tablet Take 75mg  TWICE A DAY 270 tablet 3   Multiple Vitamins-Minerals (ICAPS AREDS 2 PO) Take 1 capsule by mouth daily.      Omega-3 Fatty Acids (FISH OIL) 1200 MG CAPS Take 1,200 mg by mouth daily. Mega Red.     polyethylene glycol powder (GLYCOLAX/MIRALAX) powder Take 0.5 Containers by mouth at bedtime.   0   pravastatin (PRAVACHOL) 10 MG tablet Take 1 tablet by mouth once daily 90 tablet 3   Probiotic Product (ALIGN PO) Take 1 capsule by mouth daily.     Rivaroxaban (XARELTO) 15 MG TABS tablet Take 1 tablet (15 mg total) by  mouth daily with supper. 90 tablet 1   sodium chloride (OCEAN) 0.65 % SOLN nasal spray Place 1 spray into both nostrils daily.     temazepam (RESTORIL) 30 MG capsule Take 1 capsule by mouth once daily in the evening 90 capsule 1   Zinc 50 MG CAPS Take 50 mg by mouth daily.      No current facility-administered medications for this visit.    Allergies:   Trazodone and nefazodone   Social History:  The patient  reports that she quit smoking about 40 years ago. Her smoking use included cigarettes. She has never used smokeless tobacco. She reports current alcohol use of about 7.0 standard drinks per week. She reports that she does not use drugs.   Family History:  The patient's family history includes CAD in her brother; Congestive Heart Failure in her mother; Dementia in her mother; Diabetes in her paternal grandfather; Heart attack (age of onset: 60) in her father; Hypertension in her mother; Leukemia in her daughter; Lung cancer in her son.  ROS:  Please see the history of present illness.    All other systems are reviewed and otherwise negative.   PHYSICAL EXAM:  VS:  BP 110/62   Pulse 68   Ht 5' (1.524 m)   Wt 128 lb 12.8 oz (58.4 kg)   SpO2 97%   BMI 25.15 kg/m  BMI: Body mass index is 25.15 kg/m. Well nourished, well developed, in no acute distress  HEENT: normocephalic, atraumatic  Neck: no JVD, carotid bruits or masses Cardiac:  RRR; no significant murmurs, no rubs, or gallops Lungs:  CTA b/l, no wheezing, rhonchi or rales  Abd: soft, nontender MS: no deformity or atrophy Ext:  she has tall support stockings on, no pitting edema is appreciated  Skin: warm and dry, no rash Neuro:  No gross deficits appreciated Psych: euthymic mood, full affect  PPM site (R side) is stable, no tethering or discomfort   EKG:  Not done today  PPM interrogation done today and  reviewed by myself;  Battery and lead measurements are good AF burden is 5% (down from 15%, prior to  flecainide) No HVR   09/26/2020: stress myoview There was no ST segment deviation noted during stress. The study is normal. Negative for ischemia. This is a low risk study. This study was not gated.   07/01/2015; EPA/Ablation, Dr. Rayann Heman CONCLUSIONS: 1. Atrial paced rhythm upon presentation.   2. Successful electrical isolation and anatomical encircling of all four pulmonary veins with radiofrequency current and a WACA approach.  Additional left atrial ablation was performed to create a standard box lesion along the posterior wall of the left atrium. 3. Multiple atypical atrial flutter circuits observed too unstable for mapping today.  Mitral isthmus and Cavo-tricuspid isthmus ablation performed today 4. Successful cardioversion to sinus rhythm 5. No early apparent complications   1/0/9323: TTE IMPRESSIONS   1. Left ventricular ejection fraction, by estimation, is 60 to 65%. The  left ventricle has normal function. The left ventricle has no regional  wall motion abnormalities. There is mild left ventricular hypertrophy.  Left ventricular diastolic parameters  are consistent with Grade III diastolic dysfunction (restrictive).  Elevated left atrial pressure.   2. Right ventricular systolic function is normal. The right ventricular  size is normal. There is mildly elevated pulmonary artery systolic  pressure. The estimated right ventricular systolic pressure is 55.7 mmHg.   3. Left atrial size was moderately dilated.   4. Right atrial size was mildly dilated.   5. The mitral valve is normal in structure. Mild to moderate mitral valve  regurgitation.   6. Tricuspid valve regurgitation is mild to moderate.   7. The aortic valve is tricuspid. Aortic valve regurgitation is moderate.  Mild aortic valve sclerosis is present, with no evidence of aortic valve  stenosis.   8. The inferior vena cava is normal in size with greater than 50%  respiratory variability, suggesting right atrial  pressure of 3 mmHg.    06/05/2015: TTE Study Conclusions  - Left ventricle: The cavity size was normal. Wall thickness was    normal. Systolic function was normal. The estimated ejection    fraction was in the range of 55% to 60%. Wall motion was normal;    there were no regional wall motion abnormalities.  - Aortic valve: Trileaflet; mildly thickened, mildly calcified    leaflets. There was mild regurgitation.  - Mitral valve: Calcified annulus.  - Left atrium: The atrium was mildly dilated.  - Right ventricle: Pacer wire or catheter noted in right ventricle.   Impressions:  - Compared to the prior study, there has been no significant    interval change.   Recent Labs: 02/27/2020: Magnesium 2.2 05/13/2020: ALT 14; BUN 15; Creatinine, Ser 0.80; Hemoglobin 12.1; Platelets 300.0; Potassium 4.2; Sodium 133  No results found for requested labs within last 8760 hours.   CrCl cannot be calculated (Patient's most recent lab result is older than the maximum 21 days allowed.).   Wt Readings from Last 3 Encounters:  11/27/20 128 lb 12.8 oz (58.4 kg)  09/26/20 130 lb (59 kg)  09/22/20 130 lb 12.8 oz (59.3 kg)     Other studies reviewed: Additional studies/records reviewed today include: summarized above  ASSESSMENT AND PLAN:  1. PPM     Intact function     No programming changes made  2. Paroxysmal AFib     Has had an Atach as well     CHA2DS2vasc is 4, on  xarelto appropriately dosed  On flecainide, AV paced      5 % burden (down from 15% prior to flecainide)   She is very happy with her arrhythmia burden, does not want to make any changes  3. HTN     Looks good        Disposition: get labs from her PMD, she has f/u in Feb with Dr. Oval Linsey, continue remotes as usual and EP in 40mo, sooner if needed  Current medicines are reviewed at length with the patient today.  The patient did not have any concerns regarding medicines.  Venetia Night, PA-C 11/27/2020 2:18 PM      [address] [place] [id number] [phone] (office)  [phone] (fax)

## 2020-11-28 ENCOUNTER — Other Ambulatory Visit (HOSPITAL_BASED_OUTPATIENT_CLINIC_OR_DEPARTMENT_OTHER): Payer: Self-pay

## 2020-12-17 DIAGNOSIS — C44319 Basal cell carcinoma of skin of other parts of face: Secondary | ICD-10-CM | POA: Diagnosis not present

## 2020-12-17 DIAGNOSIS — L814 Other melanin hyperpigmentation: Secondary | ICD-10-CM | POA: Diagnosis not present

## 2020-12-17 DIAGNOSIS — C44311 Basal cell carcinoma of skin of nose: Secondary | ICD-10-CM | POA: Diagnosis not present

## 2020-12-17 DIAGNOSIS — D1801 Hemangioma of skin and subcutaneous tissue: Secondary | ICD-10-CM | POA: Diagnosis not present

## 2020-12-17 DIAGNOSIS — L821 Other seborrheic keratosis: Secondary | ICD-10-CM | POA: Diagnosis not present

## 2020-12-23 DIAGNOSIS — H16143 Punctate keratitis, bilateral: Secondary | ICD-10-CM | POA: Diagnosis not present

## 2020-12-23 DIAGNOSIS — H16213 Exposure keratoconjunctivitis, bilateral: Secondary | ICD-10-CM | POA: Diagnosis not present

## 2020-12-23 DIAGNOSIS — H0288A Meibomian gland dysfunction right eye, upper and lower eyelids: Secondary | ICD-10-CM | POA: Diagnosis not present

## 2020-12-23 DIAGNOSIS — H04123 Dry eye syndrome of bilateral lacrimal glands: Secondary | ICD-10-CM | POA: Diagnosis not present

## 2020-12-30 ENCOUNTER — Other Ambulatory Visit (HOSPITAL_BASED_OUTPATIENT_CLINIC_OR_DEPARTMENT_OTHER): Payer: Self-pay

## 2020-12-30 ENCOUNTER — Other Ambulatory Visit: Payer: Self-pay | Admitting: Internal Medicine

## 2020-12-30 DIAGNOSIS — Z1231 Encounter for screening mammogram for malignant neoplasm of breast: Secondary | ICD-10-CM

## 2021-01-02 ENCOUNTER — Other Ambulatory Visit (HOSPITAL_BASED_OUTPATIENT_CLINIC_OR_DEPARTMENT_OTHER): Payer: Self-pay

## 2021-01-05 ENCOUNTER — Other Ambulatory Visit (HOSPITAL_BASED_OUTPATIENT_CLINIC_OR_DEPARTMENT_OTHER): Payer: Self-pay

## 2021-01-20 ENCOUNTER — Other Ambulatory Visit (HOSPITAL_BASED_OUTPATIENT_CLINIC_OR_DEPARTMENT_OTHER): Payer: Self-pay

## 2021-02-03 ENCOUNTER — Other Ambulatory Visit (HOSPITAL_BASED_OUTPATIENT_CLINIC_OR_DEPARTMENT_OTHER): Payer: Self-pay

## 2021-02-06 DIAGNOSIS — M654 Radial styloid tenosynovitis [de Quervain]: Secondary | ICD-10-CM | POA: Diagnosis not present

## 2021-02-10 ENCOUNTER — Ambulatory Visit: Payer: Medicare Other

## 2021-02-13 ENCOUNTER — Ambulatory Visit (INDEPENDENT_AMBULATORY_CARE_PROVIDER_SITE_OTHER): Payer: Medicare Other

## 2021-02-13 DIAGNOSIS — I495 Sick sinus syndrome: Secondary | ICD-10-CM | POA: Diagnosis not present

## 2021-02-13 LAB — CUP PACEART REMOTE DEVICE CHECK
Battery Remaining Longevity: 132 mo
Battery Voltage: 3.02 V
Brady Statistic AP VP Percent: 65.58 %
Brady Statistic AP VS Percent: 1.01 %
Brady Statistic AS VP Percent: 0.98 %
Brady Statistic AS VS Percent: 32.43 %
Brady Statistic RA Percent Paced: 66.55 %
Brady Statistic RV Percent Paced: 66.44 %
Date Time Interrogation Session: 20230105203058
Implantable Lead Implant Date: 20101109
Implantable Lead Implant Date: 20101109
Implantable Lead Location: 753859
Implantable Lead Location: 753860
Implantable Lead Model: 4469
Implantable Lead Model: 4470
Implantable Lead Serial Number: 535369
Implantable Lead Serial Number: 657967
Implantable Pulse Generator Implant Date: 20210708
Lead Channel Impedance Value: 266 Ohm
Lead Channel Impedance Value: 399 Ohm
Lead Channel Impedance Value: 399 Ohm
Lead Channel Impedance Value: 437 Ohm
Lead Channel Pacing Threshold Amplitude: 0.5 V
Lead Channel Pacing Threshold Amplitude: 1 V
Lead Channel Pacing Threshold Pulse Width: 0.4 ms
Lead Channel Pacing Threshold Pulse Width: 0.4 ms
Lead Channel Sensing Intrinsic Amplitude: 0.5 mV
Lead Channel Sensing Intrinsic Amplitude: 0.5 mV
Lead Channel Sensing Intrinsic Amplitude: 10.75 mV
Lead Channel Sensing Intrinsic Amplitude: 10.75 mV
Lead Channel Setting Pacing Amplitude: 2 V
Lead Channel Setting Pacing Amplitude: 2.5 V
Lead Channel Setting Pacing Pulse Width: 0.4 ms
Lead Channel Setting Sensing Sensitivity: 1.2 mV

## 2021-02-16 ENCOUNTER — Other Ambulatory Visit (HOSPITAL_BASED_OUTPATIENT_CLINIC_OR_DEPARTMENT_OTHER): Payer: Self-pay

## 2021-02-16 ENCOUNTER — Other Ambulatory Visit: Payer: Self-pay | Admitting: Student

## 2021-02-16 DIAGNOSIS — I480 Paroxysmal atrial fibrillation: Secondary | ICD-10-CM

## 2021-02-17 ENCOUNTER — Telehealth: Payer: Self-pay | Admitting: Internal Medicine

## 2021-02-17 ENCOUNTER — Other Ambulatory Visit (HOSPITAL_BASED_OUTPATIENT_CLINIC_OR_DEPARTMENT_OTHER): Payer: Self-pay

## 2021-02-17 MED ORDER — METOPROLOL SUCCINATE ER 50 MG PO TB24
ORAL_TABLET | ORAL | 2 refills | Status: DC
  Filled 2021-02-17: qty 270, 90d supply, fill #0
  Filled 2021-05-04: qty 270, 90d supply, fill #1
  Filled 2021-09-01: qty 270, 90d supply, fill #2

## 2021-02-17 NOTE — Telephone Encounter (Signed)
°  1. Has your device fired? no ° °2. Is you device beeping? no ° °3. Are you experiencing draining or swelling at device site?no ° °4. Are you calling to see if we received your device transmission?yes ° °5. Have you passed out? no ° ° ° °Please route to Device Clinic Pool °

## 2021-02-20 NOTE — Telephone Encounter (Signed)
I let patient know that we received her transmission  on 02-12-2021.

## 2021-02-23 ENCOUNTER — Other Ambulatory Visit (HOSPITAL_BASED_OUTPATIENT_CLINIC_OR_DEPARTMENT_OTHER): Payer: Self-pay

## 2021-02-23 NOTE — Progress Notes (Signed)
Remote pacemaker transmission.   

## 2021-02-24 ENCOUNTER — Other Ambulatory Visit (HOSPITAL_BASED_OUTPATIENT_CLINIC_OR_DEPARTMENT_OTHER): Payer: Self-pay

## 2021-02-24 ENCOUNTER — Other Ambulatory Visit (HOSPITAL_BASED_OUTPATIENT_CLINIC_OR_DEPARTMENT_OTHER): Payer: Self-pay | Admitting: Cardiovascular Disease

## 2021-02-24 DIAGNOSIS — I480 Paroxysmal atrial fibrillation: Secondary | ICD-10-CM

## 2021-02-24 MED FILL — Diltiazem HCl Coated Beads Cap ER 24HR 360 MG: ORAL | 90 days supply | Qty: 90 | Fill #0 | Status: AC

## 2021-02-24 NOTE — Telephone Encounter (Signed)
Rx(s) sent to pharmacy electronically.  

## 2021-02-25 ENCOUNTER — Other Ambulatory Visit (HOSPITAL_BASED_OUTPATIENT_CLINIC_OR_DEPARTMENT_OTHER): Payer: Self-pay

## 2021-02-25 MED ORDER — TEMAZEPAM 30 MG PO CAPS
ORAL_CAPSULE | ORAL | 2 refills | Status: DC
  Filled 2021-02-25: qty 90, 90d supply, fill #0
  Filled 2021-05-26: qty 90, 90d supply, fill #1
  Filled 2021-08-24: qty 90, 90d supply, fill #2

## 2021-03-03 ENCOUNTER — Ambulatory Visit
Admission: RE | Admit: 2021-03-03 | Discharge: 2021-03-03 | Disposition: A | Discharge status: Home / Self Care | Payer: Medicare Other | Source: Ambulatory Visit | Attending: Internal Medicine | Admitting: Internal Medicine

## 2021-03-03 ENCOUNTER — Other Ambulatory Visit (HOSPITAL_BASED_OUTPATIENT_CLINIC_OR_DEPARTMENT_OTHER): Payer: Self-pay

## 2021-03-03 DIAGNOSIS — Z1231 Encounter for screening mammogram for malignant neoplasm of breast: Secondary | ICD-10-CM

## 2021-03-04 ENCOUNTER — Other Ambulatory Visit (HOSPITAL_BASED_OUTPATIENT_CLINIC_OR_DEPARTMENT_OTHER): Payer: Self-pay

## 2021-03-16 IMAGING — DX DG ABDOMEN 2V
2 series · 2 of 2 positions shown · non-contrast
Comparison: 11/13/2008

CLINICAL DATA: Abdominal pain 3 days.  Constipation.

EXAM:
ABDOMEN - 2 VIEW

[abdomen erect]
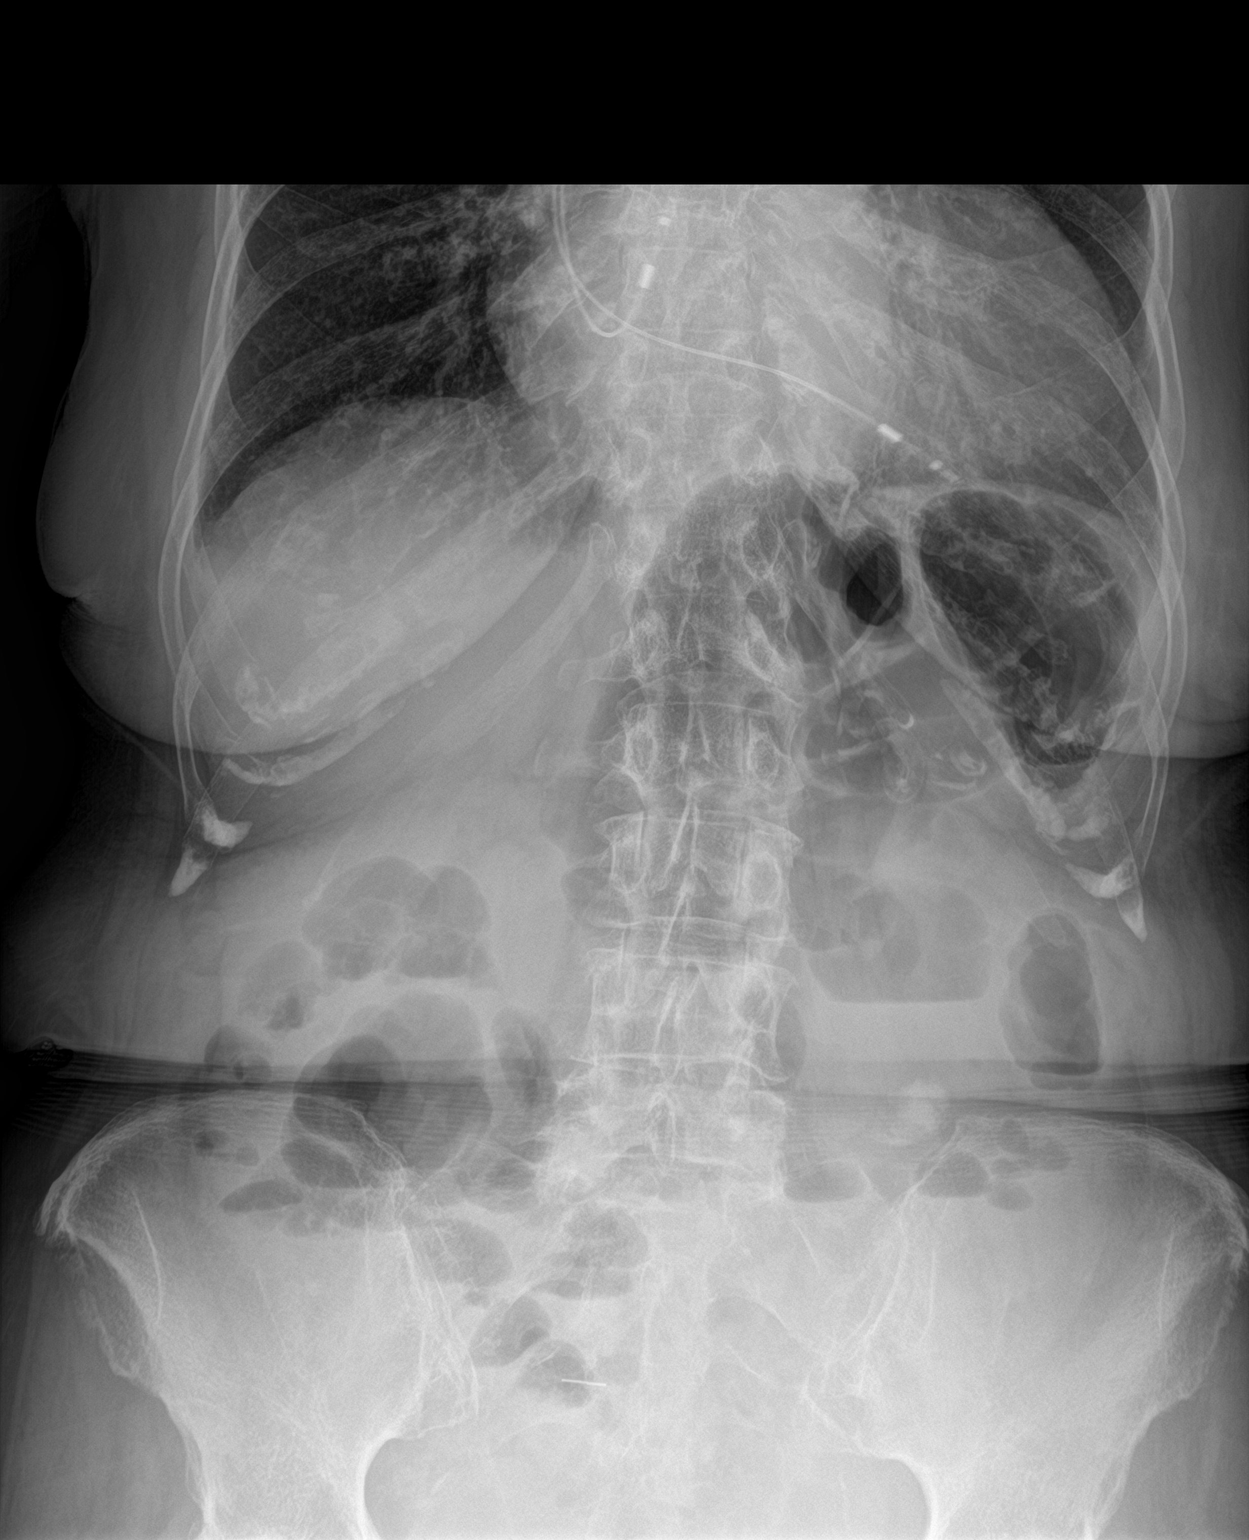

[abdomen supine]
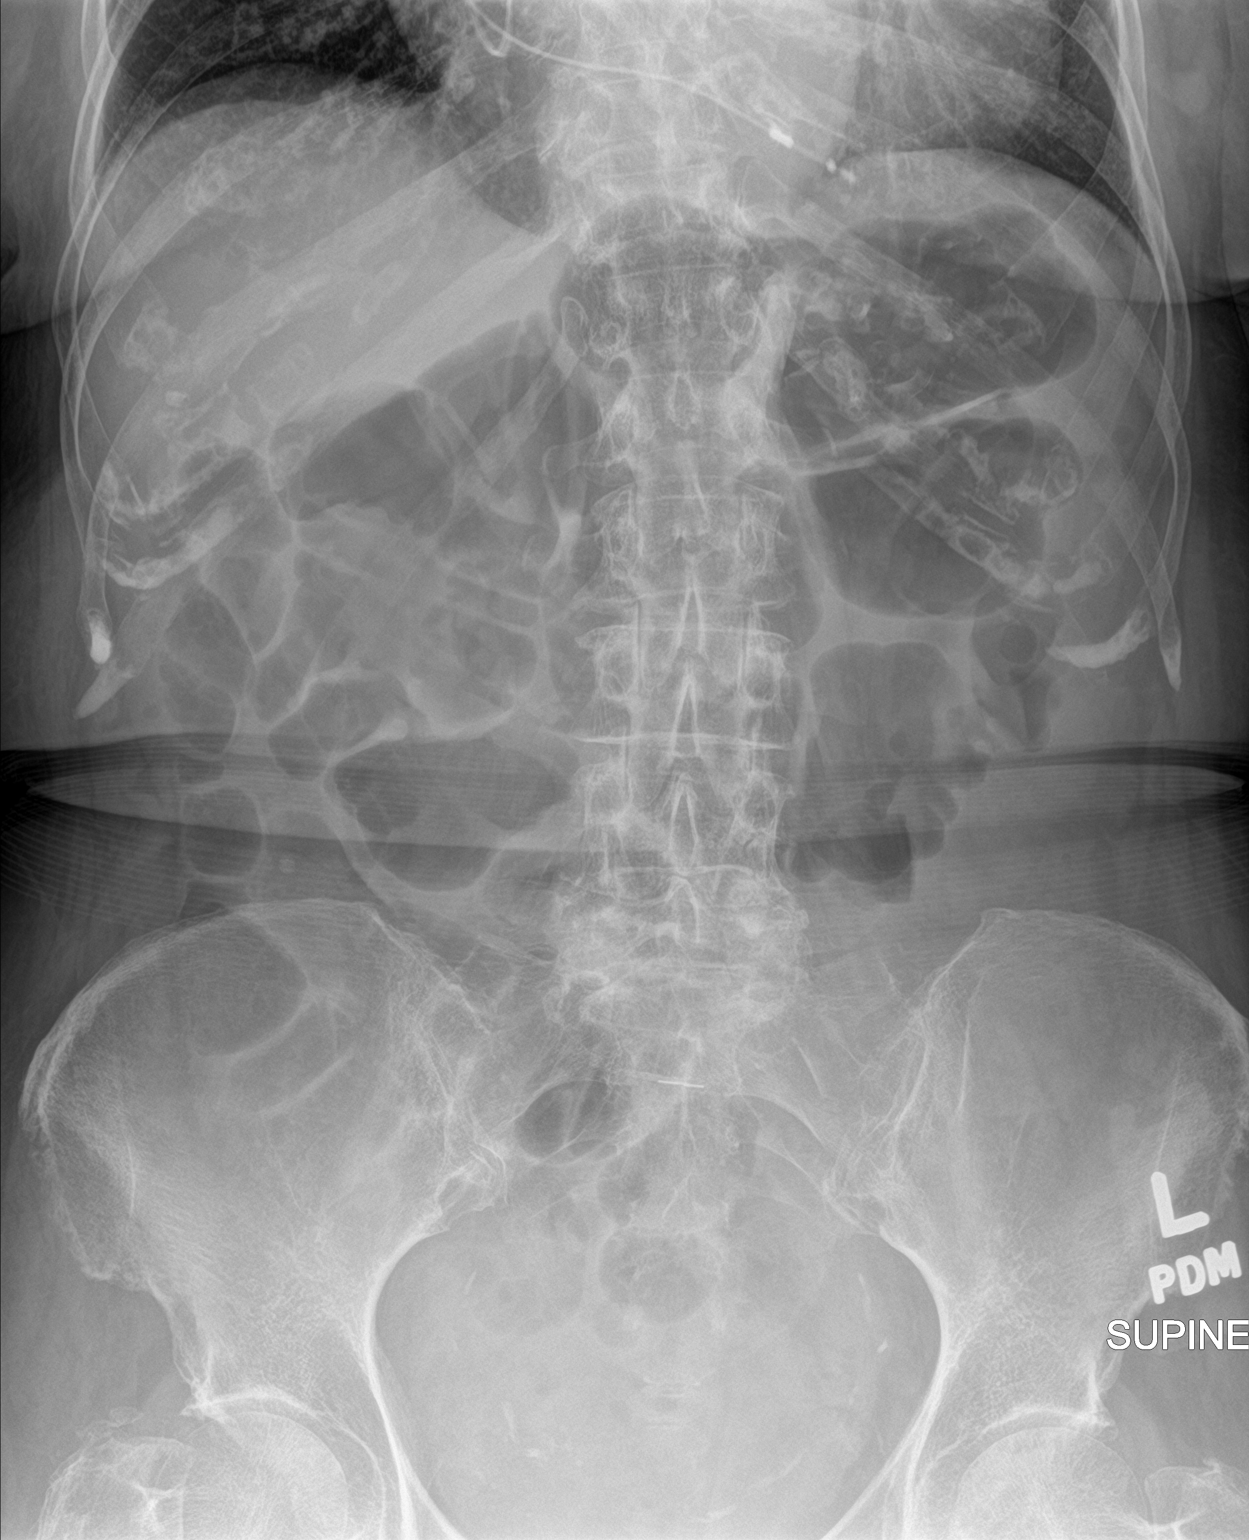

[2 of 2 positions shown; findings below may reference images not displayed]

FINDINGS: Bowel gas pattern demonstrates no significant dilated small bowel
loops. There are a few scattered air-fluid levels probably over the
colon. Air is present throughout the colon. No free peritoneal air.
Remainder the exam is unchanged.
IMPRESSION: Nonspecific, nonobstructive bowel gas pattern with a few scattered
air-fluid levels likely over the colon.

## 2021-03-19 ENCOUNTER — Other Ambulatory Visit (HOSPITAL_BASED_OUTPATIENT_CLINIC_OR_DEPARTMENT_OTHER): Payer: Self-pay

## 2021-03-23 NOTE — Progress Notes (Signed)
Cardiology Office Note   Date:  03/26/2021   ID:  Kathryn Gibson, DOB [birth year], MRN [id number]  PCP:  Prince Solian, MD  Cardiologist:   [name], MD  Electrophysiologist: Thompson Grayer  No chief complaint on file.    History of Present Illness: Kathryn Gibson is a 88 y.o. female with atrial fibrillation s/p ablation, SSS s/p PPM, asymptomatic coronary calcifications, and  hypertension, who presents for follow-up.  Kathryn Gibson  was previously a patient of Dr. Mare Ferrari.  She underwent ablation for atrial fibrillation on 07/01/15.  She last had an episode of atrial fibrillation 12/2015.  Amiodarone was previously increased due to atrial tachycardia.  This was discontinued due to elevated LFTs which subsequently normalized.  She saw Dr. Margaretann Loveless 07/2018 and reported exertional dyspnea.  Dr. Margaretann Loveless offered stress testing which she wanted to think about.  Her device was last interrogated 01/08/19 and was nearing ERI.  There were no episodes of atrial fibrillation noted.   Her generator was changed out 08/2019.  Afterward she reported feeling poorly and felt like her heart was racing, though is in sinus rhythm in the 90s.  Metoprolol was increased.  She saw Dr. Rayann Heman on 05/2020 and was doing well.  Her A. fib burden was at 3.4%.  On her last remote check 08/2020 it was 10.6%. She followed up with EP on 11/2020 and they did not make any changes. She had some chest tightness that was thought to be due to her thoracic spine fracture. She had a nuclear stress test 09/2020 that was normal.  Today, she appears well. On occasion she continues to have episodes of atrial fibrillation. These episodes tend to last for 3 consecutive days, and may occur up to 3 or more weeks apart. Lately she is still suffering from intermittent abdominal pain/tightness. When she walks, she may begin to feel the discomfort which eventually causes her to bend forwards in pain. This is more physical limiting than any  shortness of breath she may have. She is also hearing "gurgling" sounds. She endorses left ankle swelling, which is improved with wearing compression socks that do help. She does not need to take Lasix often (twice a week at most), and when she does there is not much improvement in her edema. She tries to stay hydrated with 64 oz of water daily. However, she notes not needing to urinate as often as she might expect, even if she takes the Lasix. For exercise she enjoys walking in the pool for 30 minutes, and also participating in hour long classes including both pilates and yoga. She has been intentionally trying to lose weight, but this has been difficult with her fluctuating weight. She states if she eats in the dining room at [facility], she will gain 2 lbs by the next morning. She denies any lightheadedness, headaches, syncope, orthopnea, or PND.   Past Medical History:  Diagnosis Date   Arthritis    Atrial fibrillation ([place])    Atypical chest pain 09/22/2020   Blood transfusion without reported diagnosis    2017   Bradycardia    s/p PPM   CAD in native artery 03/26/2021   Cataract    Chronic diastolic heart failure ([place]) 03/26/2021   Colonic polyp    adenomatous   Constipation    Diverticulosis    Hemorrhoids    Hyperlipidemia    Hypertension    Hypothyroidism    Irritable bowel syndrome (IBS)    Lower extremity edema 09/22/2020  Osteoporosis    osteopenia   Pacemaker 2010   UTI (urinary tract infection)     Past Surgical History:  Procedure Laterality Date   BREAST CYST ASPIRATION Left 1980s   COLONOSCOPY     ELECTROPHYSIOLOGIC STUDY N/A 07/01/2015   Procedure: Atrial Fibrillation Ablation;  Surgeon: Thompson Grayer, MD;  Location: [facility];  Service: Cardiovascular;  Laterality: N/A;   EYE SURGERY     heart ablation     for atrial fib   HEMATOMA EVACUATION Right 07/15/2014   Procedure: EVACUATION Right Groin Hematoma;  Surgeon: Rosetta Posner, MD;  Location: [facility];   Service: Vascular;  Laterality: Right;   NASAL ENDOSCOPY WITH EPISTAXIS CONTROL N/A 07/14/2014   Procedure: NASAL ENDOSCOPY WITH EPISTAXIS CONTROL;  Surgeon: Ruby Cola, MD;  Location: [facility];  Service: ENT;  Laterality: N/A;   NASAL ENDOSCOPY WITH EPISTAXIS CONTROL Bilateral 07/15/2014   Procedure: NASAL ENDOSCOPY WITH EPISTAXIS CONTROL;  Surgeon: Ruby Cola, MD;  Location: [facility];  Service: ENT;  Laterality: Bilateral;   PACEMAKER INSERTION  2010   implanted by Dr Doreatha Lew (MDT)   [place] N/A 08/16/2019   Procedure: PPM GENERATOR CHANGEOUT;  Surgeon: Thompson Grayer, MD;  Location: [facility];  Service: Cardiovascular;  Laterality: N/A;   RADIOLOGY WITH ANESTHESIA N/A 07/14/2014   Procedure: RADIOLOGY WITH ANESTHESIA;  Surgeon: Medication Radiologist, MD;  Location: [facility];  Service: Radiology;  Laterality: N/A;   TEE WITHOUT CARDIOVERSION N/A 07/01/2015   Procedure: TRANSESOPHAGEAL ECHOCARDIOGRAM (TEE);  Surgeon: Sueanne Margarita, MD;  Location: [facility];  Service: Cardiovascular;  Laterality: N/A;     Current Outpatient Medications  Medication Sig Dispense Refill   acetaminophen (TYLENOL) 500 MG tablet Take 500-1,000 mg by mouth every 6 (six) hours as needed for moderate pain or headache.     Artificial Tear Ointment (DRY EYES OP) Place 1 drop into both eyes daily as needed (Dry eye).     Ascorbic Acid (VITAMIN C) 1000 MG tablet Take 1,000 mg by mouth daily.       calcium carbonate (OSCAL) 1500 (600 Ca) MG TABS tablet Take 1 tablet by mouth daily.     cholecalciferol (VITAMIN D) 1000 UNITS tablet Take 1,000 Units by mouth daily.       Coenzyme Q10 (CO Q-10 PO) Take 1 tablet by mouth daily.     COVID-19 mRNA vaccine, Moderna, 100 MCG/0.5ML injection Inject into the muscle. 0.25 mL 0   diltiazem (CARDIZEM CD) 360 MG 24 hr capsule Take 1 capsule (360 mg total) by mouth daily. 90 capsule 2   diltiazem (CARDIZEM) 30 MG tablet Take 1/2 to 1 tablet every 4 hours AS NEEDED for  heart rate >100 as long as top blood pressure >100. 45 tablet 1   famotidine (PEPCID) 20 MG tablet Take 20 mg by mouth daily.     flecainide (TAMBOCOR) 50 MG tablet Take 1 tablet by mouth twice daily 60 tablet 10   influenza vaccine adjuvanted (FLUAD QUADRIVALENT) 0.5 ML injection Inject into the muscle. 0.5 mL 0   levothyroxine (SYNTHROID) 50 MCG tablet TAKE 1 TABLET BY MOUTH ONCE A WEEK  WITH THE 75 MCG STRENGTH 6 DAYS A WK Oral 30 days (Patient taking differently: Take 50 mcg by mouth once a week.) 12 tablet 3   levothyroxine (SYNTHROID) 75 MCG tablet TAKE 75 MCG BY MOUTH FOR 6 DAYS A WEEK (TAKE 50 MCG THE REMAINING 1 DAY OF THE WEEK). Oral 90 days 78 tablet  3   methylcellulose (CITRUCEL) oral powder Take 2 packets by mouth daily.     metoprolol succinate (TOPROL XL) 50 MG 24 hr tablet Take 75mg  TWICE A DAY 270 tablet 2   Multiple Vitamins-Minerals (ICAPS AREDS 2 PO) Take 1 capsule by mouth daily.      Omega-3 Fatty Acids (FISH OIL) 1200 MG CAPS Take 1,200 mg by mouth daily. Mega Red.     polyethylene glycol powder (GLYCOLAX/MIRALAX) powder Take 0.5 Containers by mouth at bedtime.   0   pravastatin (PRAVACHOL) 10 MG tablet Take 1 tablet by mouth once daily 90 tablet 3   Probiotic Product (ALIGN PO) Take 1 capsule by mouth daily.     Rivaroxaban (XARELTO) 15 MG TABS tablet Take 1 tablet (15 mg total) by mouth daily with supper. 90 tablet 1   sodium chloride (OCEAN) 0.65 % SOLN nasal spray Place 1 spray into both nostrils daily.     temazepam (RESTORIL) 30 MG capsule TAKE 1 CAPSULE BY MOUTH IN THE EVENING Orally Once a day 90 days 90 capsule 2   Zinc 50 MG CAPS Take 50 mg by mouth daily.      furosemide (LASIX) 20 MG tablet Take 1 tablet (20 mg total) by mouth daily as needed. 30 tablet 5   No current facility-administered medications for this visit.    Allergies:   Trazodone and nefazodone    Social History:  The patient  reports that she quit smoking about 41 years ago. Her smoking use  included cigarettes. She has never used smokeless tobacco. She reports current alcohol use of about 7.0 standard drinks per week. She reports that she does not use drugs.   Family History:  The patient's family history includes CAD in her brother; Congestive Heart Failure in her mother; Dementia in her mother; Diabetes in her paternal grandfather; Heart attack (age of onset: 78) in her father; Hypertension in her mother; Leukemia in her daughter; Lung cancer in her son.    ROS:   Please see the history of present illness. (+) Palpitations (+) Abdominal pain/tightness (+) LE edema All other systems are reviewed and negative.    PHYSICAL EXAM: VS:  BP 128/62 (BP Location: Left Arm, Patient Position: Sitting, Cuff Size: Normal)    Pulse 62    Ht 5' (1.524 m)    Wt 126 lb 6.4 oz (57.3 kg)    BMI 24.69 kg/m  , BMI Body mass index is 24.69 kg/m. GENERAL:  Well appearing HEENT: Pupils equal round and reactive, fundi not visualized, oral mucosa unremarkable NECK:  No jugular venous distention, waveform within normal limits, carotid upstroke brisk and symmetric, no bruits CHEST: R upper chest PPM incision well-healed.  No erythema, tenderness or drainage LUNGS:  Clear to auscultation bilaterally HEART:  RRR.  PMI not displaced or sustained,S1 and S2 within normal limits, no S3, no S4, no clicks, no rubs, no murmurs ABD:  Flat, positive bowel sounds normal in frequency in pitch, no bruits, no rebound, no guarding, no midline pulsatile mass, no hepatomegaly, no splenomegaly EXT:  2 plus pulses throughout, 1+ bilateral LE edema to the ankles, no cyanosis no clubbing SKIN:  No rashes no nodules NEURO:  Cranial nerves II through XII grossly intact, motor grossly intact throughout PSYCH:  Cognitively intact, oriented to person place and time   EKG:   03/26/2021: Atrial fibrillation, a-paced and v-paced. Rate 62 bpm. 09/22/2020: Atrial fibrillation and a paced V pace.  Rate 70 bpm.  Inferolateral T  wave inversions. 09/05/2019: Sinus rhythm.  Rate 91 bpm.  First-degree AV block.  Nonspecific T wave abnormalities. 01/25/19: APVS.  Long AV delay.  Rate 61 bpm.   11/18/17: AP VS.  Prolonged AV delay.  Non-specific t wave abnormalities.  12/07/16: AP VS.  Rate 65 bpm.  Non-specific t wave abnormalities.     Lexiscan Myoview 09/26/2020: There was no ST segment deviation noted during stress. The study is normal. Negative for ischemia. This is a low risk study. This study was not gated.  Echo 04/11/2020:  1. Left ventricular ejection fraction, by estimation, is 60 to 65%. The  left ventricle has normal function. The left ventricle has no regional  wall motion abnormalities. There is mild left ventricular hypertrophy.  Left ventricular diastolic parameters  are consistent with Grade III diastolic dysfunction (restrictive).  Elevated left atrial pressure.   2. Right ventricular systolic function is normal. The right ventricular  size is normal. There is mildly elevated pulmonary artery systolic  pressure. The estimated right ventricular systolic pressure is 89.2 mmHg.   3. Left atrial size was moderately dilated.   4. Right atrial size was mildly dilated.   5. The mitral valve is normal in structure. Mild to moderate mitral valve  regurgitation.   6. Tricuspid valve regurgitation is mild to moderate.   7. The aortic valve is tricuspid. Aortic valve regurgitation is moderate.  Mild aortic valve sclerosis is present, with no evidence of aortic valve  stenosis.   8. The inferior vena cava is normal in size with greater than 50%  respiratory variability, suggesting right atrial pressure of 3 mmHg.   Renal Artery Dopplers 01/22/2019: IMPRESSION: 1. Normal renal ultrasound.  Negative for hydronephrosis. 2. Normal renal artery duplex examination. No evidence for renal artery stenosis.  Echo 06/05/15: Study Conclusions   - Left ventricle: The cavity size was normal. Wall thickness was    normal. Systolic function was normal. The estimated ejection   fraction was in the range of 55% to 60%. Wall motion was normal;   there were no regional wall motion abnormalities. - Aortic valve: Trileaflet; mildly thickened, mildly calcified   leaflets. There was mild regurgitation. - Mitral valve: Calcified annulus. - Left atrium: The atrium was mildly dilated. - Right ventricle: Pacer wire or catheter noted in right ventricle.   Impressions:   - Compared to the prior study, there has been no significant   interval change.    Recent Labs: 05/13/2020: ALT 14; BUN 15; Creatinine, Ser 0.80; Hemoglobin 12.1; Platelets 300.0; Potassium 4.2; Sodium 133  02/28/15: TSH 7.24, free T4 130  04/02/16: Sodium 136, potassium 4.3, BUN 22, creatinine 0.8 WBC 6.91, hemoglobin, 12.9, hematocrit 41.2, platelets 208 Total cholesterol 163, triglycerides 45, HDL 70, LDL 84 TSH 2.12, free T4 1.3  03/23/2017:  Total cholesterol 161, triglycerides 46, HDL 73, LDL 79 ALT 25  Lipid Panel    Component Value Date/Time   CHOL 167 10/08/2015 0848   TRIG 56 10/08/2015 0848   HDL 94 10/08/2015 0848   CHOLHDL 1.8 10/08/2015 0848   VLDL 11 10/08/2015 0848   LDLCALC 62 10/08/2015 0848   05/09/2019: Total cholesterol 176, triglycerides 56, HDL 73, LDL 92    Wt Readings from Last 3 Encounters:  03/26/21 126 lb 6.4 oz (57.3 kg)  11/27/20 128 lb 12.8 oz (58.4 kg)  09/26/20 130 lb (59 kg)     ASSESSMENT AND PLAN:  Essential hypertension, benign Blood pressure has been very well controlled.  Continue diltiazem  and metoprolol.  Chronic atrial fibrillation She has paroxysmal atrial fibrillation.  In general it has been well controlled with flecainide.  Continue diltiazem, flecainide, and metoprolol.  Continue Xarelto.  Chronic diastolic heart failure (HCC) Chronic diastolic heart failure.  She has lower extremity edema on exam.  Unclear how much of this is due to venous insufficiency versus heart failure.   We will check a BMP and BNP.  She notes that she does not get much urinary output when she takes Lasix.  If BNP is elevated we may need to increase her dose.  Chronic rhinitis She has incidentally noted coronary calcification.  She is asymptomatic.  Lipids are well controlled.  Continue metoprolol and pravastatin.  CAD in native artery She has incidentally noted coronary calcification.  She is asymptomatic.  Lipids are well controlled.  Continue metoprolol and pravastatin.    Current medicines are reviewed at length with the patient today.  The patient does not have concerns regarding medicines.  The following changes have been made:  none    Labs/ tests ordered today include:   Orders Placed This Encounter  Procedures   Basic metabolic panel   B Nat Peptide   EKG 12-Lead   Disposition:    FU with Keera Altidor C. Oval Linsey, MD, [facility] in 6 months.   I,Mathew Stumpf,acting as a Education administrator for [name], MD.,have documented all relevant documentation on the behalf of [name], MD,as directed by  [name], MD while in the presence of [name], MD.  I, Braxton Oval Linsey, MD have reviewed all documentation for this visit.  The documentation of the exam, diagnosis, procedures, and orders on 03/26/2021 are all accurate and complete.   Signed, Dayrin Stallone C. Oval Linsey, MD, [facility]  03/26/2021 5:34 PM    [place]

## 2021-03-26 ENCOUNTER — Ambulatory Visit (HOSPITAL_BASED_OUTPATIENT_CLINIC_OR_DEPARTMENT_OTHER): Payer: Medicare Other | Admitting: Cardiovascular Disease

## 2021-03-26 ENCOUNTER — Other Ambulatory Visit: Payer: Self-pay

## 2021-03-26 ENCOUNTER — Encounter (HOSPITAL_BASED_OUTPATIENT_CLINIC_OR_DEPARTMENT_OTHER): Payer: Self-pay | Admitting: Cardiovascular Disease

## 2021-03-26 VITALS — BP 128/62 | HR 62 | Ht 60.0 in | Wt 126.4 lb

## 2021-03-26 DIAGNOSIS — R0602 Shortness of breath: Secondary | ICD-10-CM

## 2021-03-26 DIAGNOSIS — I10 Essential (primary) hypertension: Secondary | ICD-10-CM | POA: Diagnosis not present

## 2021-03-26 DIAGNOSIS — I2510 Atherosclerotic heart disease of native coronary artery without angina pectoris: Secondary | ICD-10-CM

## 2021-03-26 DIAGNOSIS — I5032 Chronic diastolic (congestive) heart failure: Secondary | ICD-10-CM | POA: Diagnosis not present

## 2021-03-26 DIAGNOSIS — I482 Chronic atrial fibrillation, unspecified: Secondary | ICD-10-CM | POA: Diagnosis not present

## 2021-03-26 DIAGNOSIS — J310 Chronic rhinitis: Secondary | ICD-10-CM

## 2021-03-26 HISTORY — DX: Chronic diastolic (congestive) heart failure: I50.32

## 2021-03-26 HISTORY — DX: Atherosclerotic heart disease of native coronary artery without angina pectoris: I25.10

## 2021-03-26 NOTE — Assessment & Plan Note (Signed)
She has paroxysmal atrial fibrillation.  In general it has been well controlled with flecainide.  Continue diltiazem, flecainide, and metoprolol.  Continue Xarelto.

## 2021-03-26 NOTE — Assessment & Plan Note (Signed)
She has incidentally noted coronary calcification.  She is asymptomatic.  Lipids are well controlled.  Continue metoprolol and pravastatin.

## 2021-03-26 NOTE — Assessment & Plan Note (Signed)
Blood pressure has been very well controlled.  Continue diltiazem and metoprolol.

## 2021-03-26 NOTE — Patient Instructions (Signed)
Medication Instructions:  Your physician recommends that you continue on your current medications as directed. Please refer to the Current Medication list given to you today.  *If you need a refill on your cardiac medications before your next appointment, please call your pharmacy*  Lab Work: BMET/BNP TODAY   If you have labs (blood work) drawn today and your tests are completely normal, you will receive your results only by: MyChart Message (if you have MyChart) OR A paper copy in the mail If you have any lab test that is abnormal or we need to change your treatment, we will call you to review the results.  Testing/Procedures: NONE  Follow-Up: At [facility], you and your health needs are our priority.  As part of our continuing mission to provide you with exceptional heart care, we have created designated Provider Care Teams.  These Care Teams include your primary Cardiologist (physician) and Advanced Practice Providers (APPs -  Physician Assistants and Nurse Practitioners) who all work together to provide you with the care you need, when you need it.  We recommend signing up for the patient portal called "MyChart".  Sign up information is provided on this After Visit Summary.  MyChart is used to connect with patients for Virtual Visits (Telemedicine).  Patients are able to view lab/test results, encounter notes, upcoming appointments, etc.  Non-urgent messages can be sent to your provider as well.   To learn more about what you can do with MyChart, go to [email or web address].    Your next appointment:   6 month(s)  The format for your next appointment:   In Person  Provider:   Tiffany Catalina, MD{      

## 2021-03-26 NOTE — Assessment & Plan Note (Signed)
Chronic diastolic heart failure.  She has lower extremity edema on exam.  Unclear how much of this is due to venous insufficiency versus heart failure.  We will check a BMP and BNP.  She notes that she does not get much urinary output when she takes Lasix.  If BNP is elevated we may need to increase her dose.

## 2021-03-27 LAB — BASIC METABOLIC PANEL
BUN/Creatinine Ratio: 17 (ref 12–28)
BUN: 16 mg/dL (ref 8–27)
CO2: 25 mmol/L (ref 20–29)
Calcium: 9.8 mg/dL (ref 8.7–10.3)
Chloride: 97 mmol/L (ref 96–106)
Creatinine, Ser: 0.92 mg/dL (ref 0.57–1.00)
Glucose: 105 mg/dL — ABNORMAL HIGH (ref 70–99)
Potassium: 4.4 mmol/L (ref 3.5–5.2)
Sodium: 136 mmol/L (ref 134–144)
eGFR: 61 mL/min/{1.73_m2} (ref >59)

## 2021-03-27 LAB — BRAIN NATRIURETIC PEPTIDE: BNP: 310.2 pg/mL — ABNORMAL HIGH (ref 0.0–100.0)

## 2021-03-30 ENCOUNTER — Other Ambulatory Visit (HOSPITAL_BASED_OUTPATIENT_CLINIC_OR_DEPARTMENT_OTHER): Payer: Self-pay

## 2021-03-30 ENCOUNTER — Other Ambulatory Visit: Payer: Self-pay | Admitting: Cardiovascular Disease

## 2021-03-31 ENCOUNTER — Other Ambulatory Visit (HOSPITAL_BASED_OUTPATIENT_CLINIC_OR_DEPARTMENT_OTHER): Payer: Self-pay

## 2021-03-31 ENCOUNTER — Telehealth (HOSPITAL_BASED_OUTPATIENT_CLINIC_OR_DEPARTMENT_OTHER): Payer: Self-pay | Admitting: *Deleted

## 2021-03-31 DIAGNOSIS — Z5181 Encounter for therapeutic drug level monitoring: Secondary | ICD-10-CM

## 2021-03-31 DIAGNOSIS — I10 Essential (primary) hypertension: Secondary | ICD-10-CM

## 2021-03-31 MED ORDER — RIVAROXABAN 15 MG PO TABS
15.0000 mg | ORAL_TABLET | Freq: Every day | ORAL | 2 refills | Status: DC
  Filled 2021-03-31: qty 90, 90d supply, fill #0
  Filled 2021-07-03: qty 30, 30d supply, fill #1
  Filled 2021-08-03: qty 90, 90d supply, fill #2
  Filled 2021-10-29: qty 60, 60d supply, fill #3

## 2021-03-31 NOTE — Telephone Encounter (Signed)
Rx(s) sent to pharmacy electronically.  

## 2021-03-31 NOTE — Telephone Encounter (Signed)
-----   Message from [name], MD sent at 03/29/2021 11:35 PM EST ----- Normal kidney function.  Heart failure lab is elevated which means she is retaining fluid.  Recommend increasing lasix to 40mg  daily.  Repeat BMP in a week.

## 2021-03-31 NOTE — Telephone Encounter (Signed)
Spoke with patient and she only takes Lasix 20 mg as needed Discussed with Dr Casimiro Needle and will have her start Lasix 20 mg daily  Advised patient, verbalized understanding

## 2021-04-01 ENCOUNTER — Other Ambulatory Visit (HOSPITAL_BASED_OUTPATIENT_CLINIC_OR_DEPARTMENT_OTHER): Payer: Self-pay

## 2021-04-02 ENCOUNTER — Other Ambulatory Visit (HOSPITAL_BASED_OUTPATIENT_CLINIC_OR_DEPARTMENT_OTHER): Payer: Self-pay

## 2021-04-07 DIAGNOSIS — I10 Essential (primary) hypertension: Secondary | ICD-10-CM | POA: Diagnosis not present

## 2021-04-07 DIAGNOSIS — Z5181 Encounter for therapeutic drug level monitoring: Secondary | ICD-10-CM | POA: Diagnosis not present

## 2021-04-07 LAB — BASIC METABOLIC PANEL
BUN/Creatinine Ratio: 16 (ref 12–28)
BUN: 14 mg/dL (ref 8–27)
CO2: 26 mmol/L (ref 20–29)
Calcium: 9.5 mg/dL (ref 8.7–10.3)
Chloride: 93 mmol/L — ABNORMAL LOW (ref 96–106)
Creatinine, Ser: 0.89 mg/dL (ref 0.57–1.00)
Glucose: 87 mg/dL (ref 70–99)
Potassium: 4.2 mmol/L (ref 3.5–5.2)
Sodium: 133 mmol/L — ABNORMAL LOW (ref 134–144)
eGFR: 63 mL/min/{1.73_m2} (ref >59)

## 2021-04-08 ENCOUNTER — Encounter (HOSPITAL_BASED_OUTPATIENT_CLINIC_OR_DEPARTMENT_OTHER): Payer: Self-pay | Admitting: Cardiovascular Disease

## 2021-04-10 DIAGNOSIS — H524 Presbyopia: Secondary | ICD-10-CM | POA: Diagnosis not present

## 2021-04-13 ENCOUNTER — Other Ambulatory Visit (HOSPITAL_BASED_OUTPATIENT_CLINIC_OR_DEPARTMENT_OTHER): Payer: Self-pay

## 2021-04-13 DIAGNOSIS — I2510 Atherosclerotic heart disease of native coronary artery without angina pectoris: Secondary | ICD-10-CM | POA: Diagnosis not present

## 2021-04-13 DIAGNOSIS — D649 Anemia, unspecified: Secondary | ICD-10-CM | POA: Diagnosis not present

## 2021-04-13 DIAGNOSIS — I872 Venous insufficiency (chronic) (peripheral): Secondary | ICD-10-CM | POA: Diagnosis not present

## 2021-04-13 DIAGNOSIS — I480 Paroxysmal atrial fibrillation: Secondary | ICD-10-CM | POA: Diagnosis not present

## 2021-04-13 MED ORDER — LEVOTHYROXINE SODIUM 50 MCG PO TABS
ORAL_TABLET | ORAL | 3 refills | Status: DC
  Filled 2021-04-13: qty 36, 90d supply, fill #0

## 2021-04-14 NOTE — Telephone Encounter (Signed)
Follow up on your message to patient ?

## 2021-04-15 ENCOUNTER — Other Ambulatory Visit: Payer: Self-pay | Admitting: Internal Medicine

## 2021-04-15 ENCOUNTER — Other Ambulatory Visit (HOSPITAL_BASED_OUTPATIENT_CLINIC_OR_DEPARTMENT_OTHER): Payer: Self-pay

## 2021-04-15 MED ORDER — PRAVASTATIN SODIUM 10 MG PO TABS
ORAL_TABLET | ORAL | 3 refills | Status: DC
  Filled 2021-04-15: qty 90, 90d supply, fill #0
  Filled 2021-07-21: qty 90, 90d supply, fill #1
  Filled 2021-10-15: qty 90, 90d supply, fill #2
  Filled 2022-01-05: qty 90, 90d supply, fill #3

## 2021-04-16 ENCOUNTER — Other Ambulatory Visit (HOSPITAL_BASED_OUTPATIENT_CLINIC_OR_DEPARTMENT_OTHER): Payer: Self-pay

## 2021-04-17 ENCOUNTER — Other Ambulatory Visit (HOSPITAL_BASED_OUTPATIENT_CLINIC_OR_DEPARTMENT_OTHER): Payer: Self-pay

## 2021-04-20 DIAGNOSIS — H16143 Punctate keratitis, bilateral: Secondary | ICD-10-CM | POA: Diagnosis not present

## 2021-04-20 DIAGNOSIS — H04123 Dry eye syndrome of bilateral lacrimal glands: Secondary | ICD-10-CM | POA: Diagnosis not present

## 2021-04-20 DIAGNOSIS — H1045 Other chronic allergic conjunctivitis: Secondary | ICD-10-CM | POA: Diagnosis not present

## 2021-04-28 IMAGING — DX DG ABDOMEN 2V
2 series · 2 of 2 positions shown · non-contrast
Comparison: 03/25/2020

CLINICAL DATA: Bloating and chronic constipation, subsequent
encounter

EXAM:
ABDOMEN - 2 VIEW

[abdomen erect]
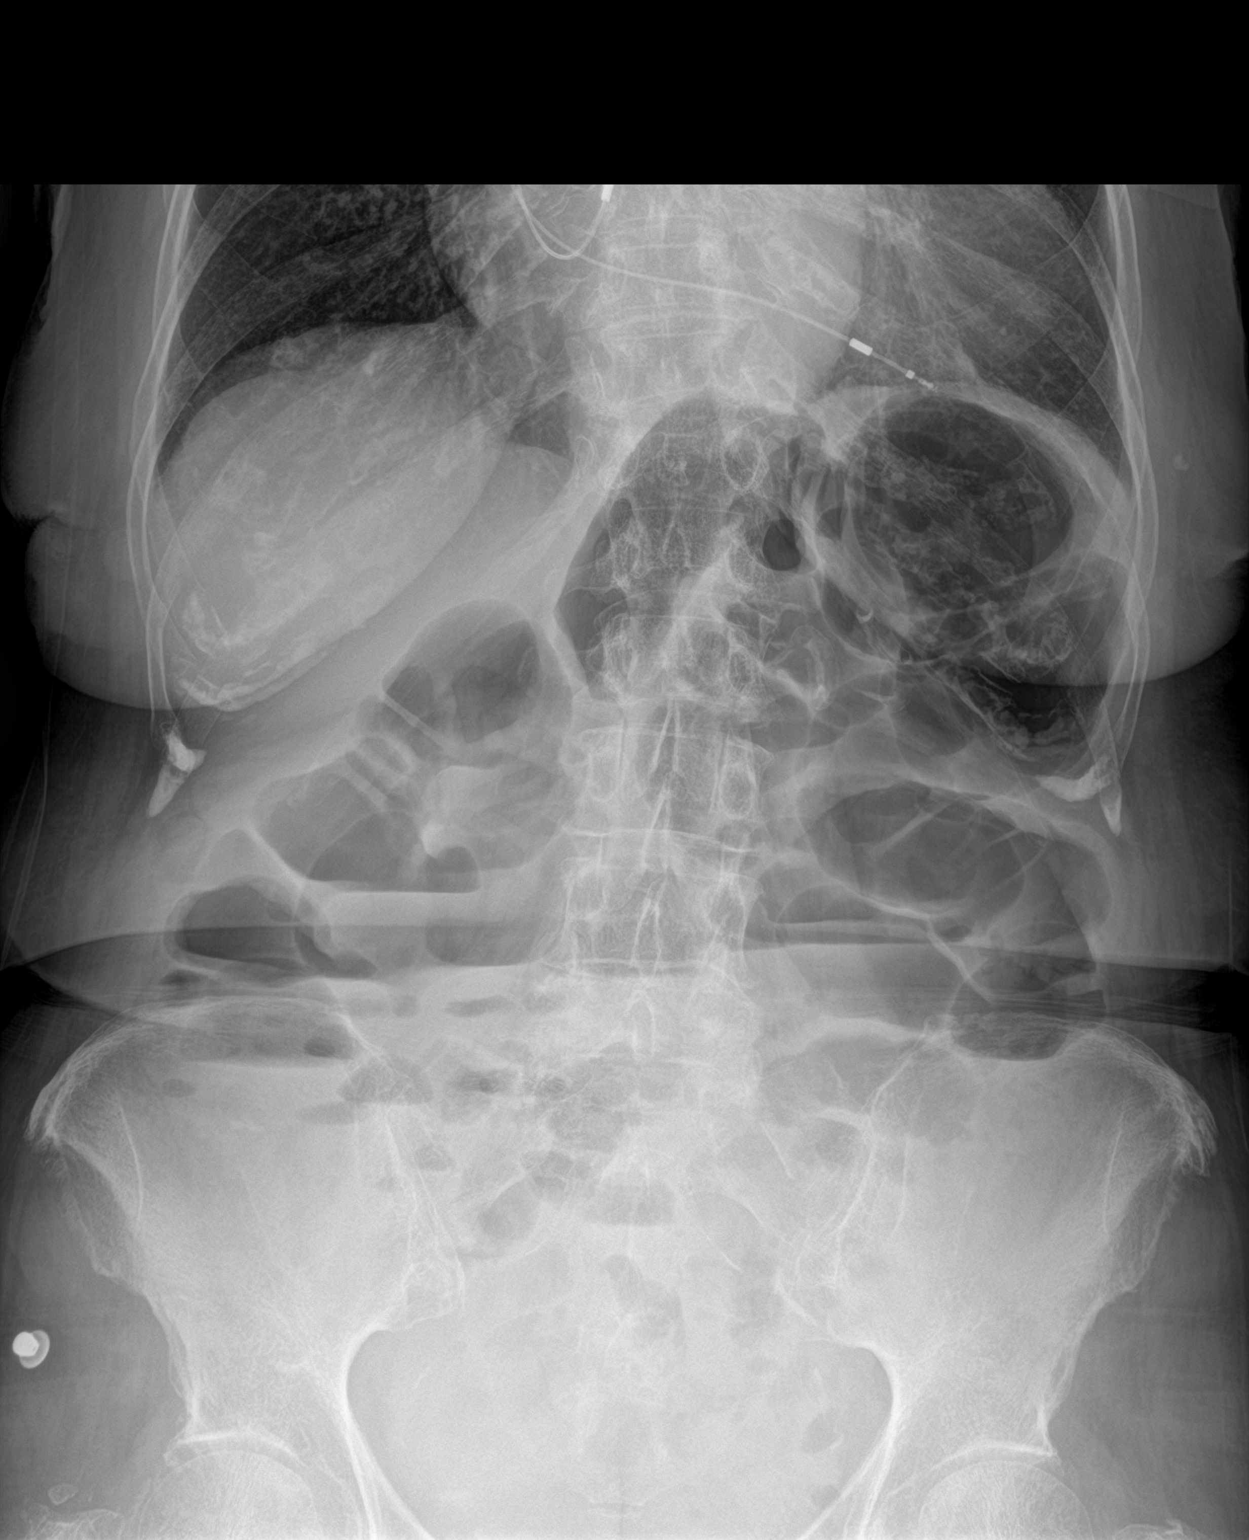

[abdomen supine]
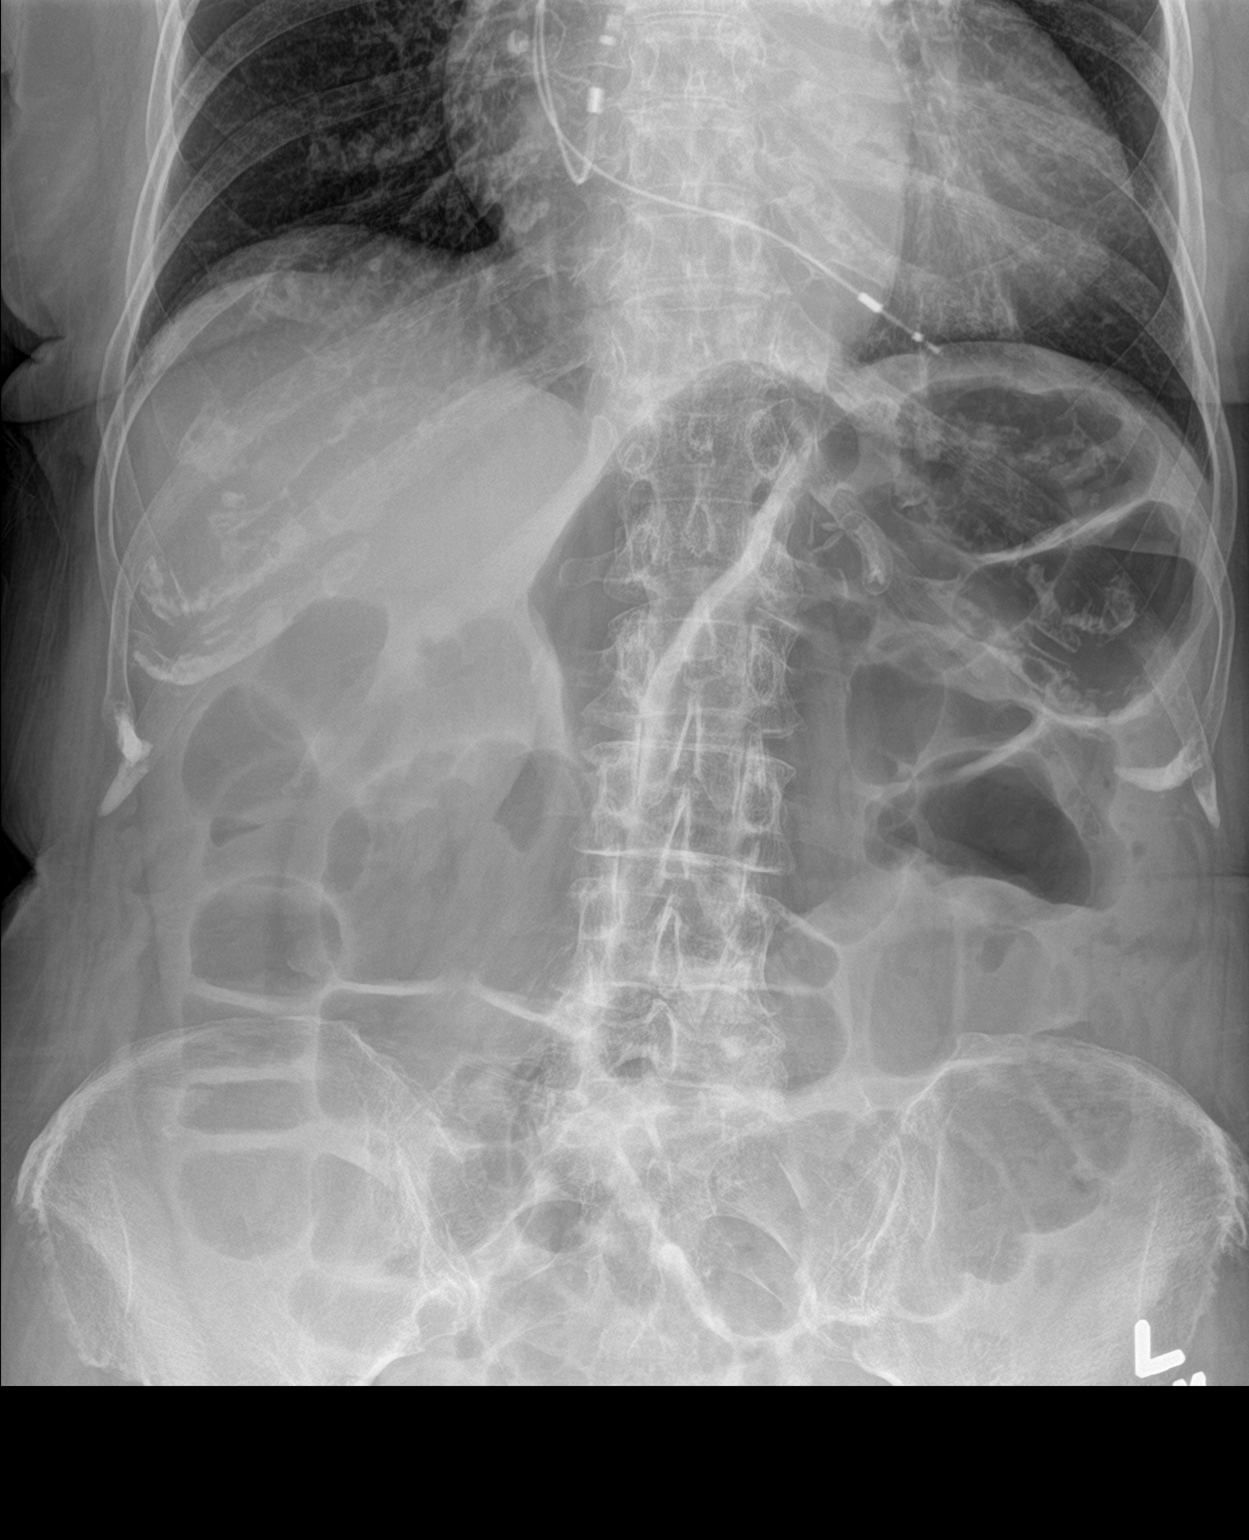

[2 of 2 positions shown; findings below may reference images not displayed]

FINDINGS: Scattered large and small bowel gas is noted. Air-fluid levels are
noted within the colon and small bowel although no definitive
obstructive changes are seen. No free air is noted. No abnormal mass
or abnormal calcifications are noted. Bony structures are stable.
IMPRESSION: Scattered air-fluid levels throughout the colon and small bowel
without definitive obstructive changes.

## 2021-04-29 ENCOUNTER — Other Ambulatory Visit (HOSPITAL_BASED_OUTPATIENT_CLINIC_OR_DEPARTMENT_OTHER): Payer: Self-pay

## 2021-05-04 ENCOUNTER — Other Ambulatory Visit (HOSPITAL_BASED_OUTPATIENT_CLINIC_OR_DEPARTMENT_OTHER): Payer: Self-pay

## 2021-05-05 ENCOUNTER — Other Ambulatory Visit (HOSPITAL_BASED_OUTPATIENT_CLINIC_OR_DEPARTMENT_OTHER): Payer: Self-pay

## 2021-05-11 ENCOUNTER — Other Ambulatory Visit: Payer: Self-pay | Admitting: Internal Medicine

## 2021-05-11 DIAGNOSIS — E039 Hypothyroidism, unspecified: Secondary | ICD-10-CM | POA: Diagnosis not present

## 2021-05-11 DIAGNOSIS — E871 Hypo-osmolality and hyponatremia: Secondary | ICD-10-CM

## 2021-05-11 DIAGNOSIS — M859 Disorder of bone density and structure, unspecified: Secondary | ICD-10-CM | POA: Diagnosis not present

## 2021-05-11 DIAGNOSIS — E785 Hyperlipidemia, unspecified: Secondary | ICD-10-CM | POA: Diagnosis not present

## 2021-05-11 DIAGNOSIS — I10 Essential (primary) hypertension: Secondary | ICD-10-CM | POA: Diagnosis not present

## 2021-05-11 DIAGNOSIS — R34 Anuria and oliguria: Secondary | ICD-10-CM

## 2021-05-11 DIAGNOSIS — R194 Change in bowel habit: Secondary | ICD-10-CM

## 2021-05-15 ENCOUNTER — Ambulatory Visit (INDEPENDENT_AMBULATORY_CARE_PROVIDER_SITE_OTHER): Payer: Medicare Other

## 2021-05-15 DIAGNOSIS — I495 Sick sinus syndrome: Secondary | ICD-10-CM | POA: Diagnosis not present

## 2021-05-15 LAB — CUP PACEART REMOTE DEVICE CHECK
Battery Remaining Longevity: 127 mo
Battery Voltage: 3.01 V
Brady Statistic AP VP Percent: 81.76 %
Brady Statistic AP VS Percent: 0.9 %
Brady Statistic AS VP Percent: 0.5 %
Brady Statistic AS VS Percent: 16.84 %
Brady Statistic RA Percent Paced: 82.52 %
Brady Statistic RV Percent Paced: 82.14 %
Date Time Interrogation Session: 20230406205014
Implantable Lead Implant Date: 20101109
Implantable Lead Implant Date: 20101109
Implantable Lead Location: 753859
Implantable Lead Location: 753860
Implantable Lead Model: 4469
Implantable Lead Model: 4470
Implantable Lead Serial Number: 535369
Implantable Lead Serial Number: 657967
Implantable Pulse Generator Implant Date: 20210708
Lead Channel Impedance Value: 285 Ohm
Lead Channel Impedance Value: 399 Ohm
Lead Channel Impedance Value: 437 Ohm
Lead Channel Impedance Value: 456 Ohm
Lead Channel Pacing Threshold Amplitude: 0.625 V
Lead Channel Pacing Threshold Amplitude: 1 V
Lead Channel Pacing Threshold Pulse Width: 0.4 ms
Lead Channel Pacing Threshold Pulse Width: 0.4 ms
Lead Channel Sensing Intrinsic Amplitude: 0.375 mV
Lead Channel Sensing Intrinsic Amplitude: 0.375 mV
Lead Channel Sensing Intrinsic Amplitude: 11.875 mV
Lead Channel Sensing Intrinsic Amplitude: 11.875 mV
Lead Channel Setting Pacing Amplitude: 2 V
Lead Channel Setting Pacing Amplitude: 2.5 V
Lead Channel Setting Pacing Pulse Width: 0.4 ms
Lead Channel Setting Sensing Sensitivity: 1.2 mV

## 2021-05-18 DIAGNOSIS — Z0000 Encounter for general adult medical examination without abnormal findings: Secondary | ICD-10-CM | POA: Diagnosis not present

## 2021-05-18 DIAGNOSIS — D649 Anemia, unspecified: Secondary | ICD-10-CM | POA: Diagnosis not present

## 2021-05-18 DIAGNOSIS — I480 Paroxysmal atrial fibrillation: Secondary | ICD-10-CM | POA: Diagnosis not present

## 2021-05-18 DIAGNOSIS — R82998 Other abnormal findings in urine: Secondary | ICD-10-CM | POA: Diagnosis not present

## 2021-05-18 DIAGNOSIS — I2510 Atherosclerotic heart disease of native coronary artery without angina pectoris: Secondary | ICD-10-CM | POA: Diagnosis not present

## 2021-05-18 DIAGNOSIS — I10 Essential (primary) hypertension: Secondary | ICD-10-CM | POA: Diagnosis not present

## 2021-05-19 ENCOUNTER — Other Ambulatory Visit: Payer: Self-pay | Admitting: Internal Medicine

## 2021-05-19 DIAGNOSIS — K869 Disease of pancreas, unspecified: Secondary | ICD-10-CM

## 2021-05-20 DIAGNOSIS — Z01411 Encounter for gynecological examination (general) (routine) with abnormal findings: Secondary | ICD-10-CM | POA: Diagnosis not present

## 2021-05-20 DIAGNOSIS — R109 Unspecified abdominal pain: Secondary | ICD-10-CM | POA: Diagnosis not present

## 2021-05-21 ENCOUNTER — Other Ambulatory Visit (HOSPITAL_BASED_OUTPATIENT_CLINIC_OR_DEPARTMENT_OTHER): Payer: Self-pay | Admitting: Internal Medicine

## 2021-05-21 DIAGNOSIS — K869 Disease of pancreas, unspecified: Secondary | ICD-10-CM

## 2021-05-23 ENCOUNTER — Ambulatory Visit (HOSPITAL_BASED_OUTPATIENT_CLINIC_OR_DEPARTMENT_OTHER): Payer: Medicare Other

## 2021-05-26 ENCOUNTER — Other Ambulatory Visit (HOSPITAL_BASED_OUTPATIENT_CLINIC_OR_DEPARTMENT_OTHER): Payer: Self-pay

## 2021-05-26 MED FILL — Diltiazem HCl Coated Beads Cap ER 24HR 360 MG: ORAL | 90 days supply | Qty: 90 | Fill #1 | Status: AC

## 2021-05-27 NOTE — Progress Notes (Signed)
? ?Cardiology Office Note ?Date:  05/27/2021  ?Patient ID:  Kathryn, Gibson 1933/12/23, MRN [id number] ?PCP:  Prince Solian, MD  ?Cardiologist:  Dr. Oval Linsey ?Electrophysiologist: Dr. Rayann Heman ? ?  ?Chief Complaint:  6 mo ? ?History of Present Illness: ?Kathryn Gibson is a 88 y.o. female with history of AFib, bradycardia w/PPM, HTN, hypothyroidism. ? ?Feb 2021 she had an Afib  Clinic and cardiology visit with some subjective feelings of increased pulse, unusual DOE, fatiigue, device checked without arrhythmia, was nearing ERI.  She was offered to get some w/u done for this though looks like she wanted to hold off. ? ?I saw her feb 2021 ?She is doing OK.  She lives at [place] and with the onset of COVID restrictions, she was spending all of her time in her home, unable to be out and about, all her usual exercise activities stopped including pool, yoga, everything.  She has recently been back at some exercise and feels like she gets winded much quicker and more fatigued afterwards.  She feels like she has good exertion in the pool, but other activities not as much.  She can walk up the stairs too her apartmentand does not have to stop, but is winded.  No CP, notices her heart gets faster then it did before with exercise.   ?No dizzy spells, near syncope or syncope. ?She denies any bleeding or signs of bleeding ?She has labs regularly with her PMD, mentions that her last labs told her kidney and liver function was a little abnormal and scheduled for f/u labs on Friday.  She was not told to stop or change any of her medicines. ?I suspected deconditioning with reduced activity with COVID restrictions at the senior living center, no changes were made with plans to monitor symptoms as her activties increased back to her usual. ?Her device was nearing ERI ? ?She has seen Dr. Rayann Heman and cardiology service a number of times since then. ? ?She last saw Dr. Rayann Heman April 2022, c/o chronic back pain that has limited her  ability to do things, AF burden 3.4% up for her since on Flecainide, with severe LA enlargement predicted she would have progression of her AF burden. ? ?More recently she saw Dr. Oval Linsey, Aug 2022, doing water aerobics daily though walking  on land was hard and difficult with her back.  Mentioned some neck tightness to jaw with her exercising at times.  Some edema aslo noted by the patient, planned for stress testing, lasix PRN was added to her regime. ? ?Stress test was normal. ? ?I saw her Oct 2022 ?She continues to do well. Limited by her back and this frustrates her, but generally feeling well ?Continues with her water aerobics and denies any CP or SOB, no neck symptoms with exertion or t rest. ?She does have palpitations, they wax and wane, will go svereal days with none then have days with intermittent brief episodes. ?Nothing that lasts more then a few monutes and ususaly feel much shorted. ?She has only once taken a PRN dilt  ?No dizzy spells, near syncope or syncope. ?No bleeding or signs of bleeding ?Saw her PMD yesterday and had labs done ?Burden was only 5%, she was happy with her regime, no changes were made. ? ?She saw Dr. Oval Linsey Feb 2023, reported epsiodes of AFib klasting 3 days avery few weeks perhaps, another c/o abd pain as well. Waxing/waning weight/edema ?Planned for BNP, and med adjustment pending result ?Lasix was increased. ? ? ?TODAY ?  She continues to do quite well ?She reports minimal burde of Afib, feels confident she knows when she has it. ?Rarely has she used the PRN diltiazem dose. ?No CP ?Denies any ongoing SOB ?She reports she is very active, does pool exercises every day, walks back/forth several times, and does Yoga on Wednesdays.  She reports good exertional capacity ?She has a terrible back and walking on "land" gives her a lot of pain, including belly pain. ?But can be as active as she wants to in the pool without symptoms. ?No near syncope or syncope. ?No bleeding or signs  of bleeding ? ?Device information  ?MDT dual chamber PPM implanted 12/17/2008, gen change 08/16/2019 ? ?AFib hx ?07/01/2015: PVI ablation, including Mitral isthmus and Cavo-tricuspid isthmus ablation noting Multiple atypical atrial flutter circuits observed too unstable for mapping  ?Has had an ATach ?AAD  ?Amiodarone goes back at least to 2012 >>> STOPPED 2/2 abn LFTs ?Flecainide started Jan 2022 ? ?Past Medical History:  ?Diagnosis Date  ? Arthritis   ? Atrial fibrillation ([place])   ? Atypical chest pain 09/22/2020  ? Blood transfusion without reported diagnosis   ? 2017  ? Bradycardia   ? s/p PPM  ? CAD in native artery 03/26/2021  ? Cataract   ? Chronic diastolic heart failure ([place]) 03/26/2021  ? Colonic polyp   ? adenomatous  ? Constipation   ? Diverticulosis   ? Hemorrhoids   ? Hyperlipidemia   ? Hypertension   ? Hypothyroidism   ? Irritable bowel syndrome (IBS)   ? Lower extremity edema 09/22/2020  ? Osteoporosis   ? osteopenia  ? Pacemaker 2010  ? UTI (urinary tract infection)   ? ? ?Past Surgical History:  ?Procedure Laterality Date  ? BREAST CYST ASPIRATION Left 1980s  ? COLONOSCOPY    ? ELECTROPHYSIOLOGIC STUDY N/A 07/01/2015  ? Procedure: Atrial Fibrillation Ablation;  Surgeon: Thompson Grayer, MD;  Location: [facility];  Service: Cardiovascular;  Laterality: N/A;  ? EYE SURGERY    ? heart ablation    ? for atrial fib  ? HEMATOMA EVACUATION Right 07/15/2014  ? Procedure: EVACUATION Right Groin Hematoma;  Surgeon: Rosetta Posner, MD;  Location: [facility];  Service: Vascular;  Laterality: Right;  ? NASAL ENDOSCOPY WITH EPISTAXIS CONTROL N/A 07/14/2014  ? Procedure: NASAL ENDOSCOPY WITH EPISTAXIS CONTROL;  Surgeon: Ruby Cola, MD;  Location: [facility];  Service: ENT;  Laterality: N/A;  ? NASAL ENDOSCOPY WITH EPISTAXIS CONTROL Bilateral 07/15/2014  ? Procedure: NASAL ENDOSCOPY WITH EPISTAXIS CONTROL;  Surgeon: Ruby Cola, MD;  Location: [facility];  Service: ENT;  Laterality: Bilateral;  ? PACEMAKER INSERTION  2010  ? implanted  by Dr Doreatha Lew (MDT)  ? PPM GENERATOR CHANGEOUT N/A 08/16/2019  ? Procedure: PPM GENERATOR CHANGEOUT;  Surgeon: Thompson Grayer, MD;  Location: [facility];  Service: Cardiovascular;  Laterality: N/A;  ? RADIOLOGY WITH ANESTHESIA N/A 07/14/2014  ? Procedure: RADIOLOGY WITH ANESTHESIA;  Surgeon: Medication Radiologist, MD;  Location: [facility];  Service: Radiology;  Laterality: N/A;  ? TEE WITHOUT CARDIOVERSION N/A 07/01/2015  ? Procedure: TRANSESOPHAGEAL ECHOCARDIOGRAM (TEE);  Surgeon: Sueanne Margarita, MD;  Location: [facility];  Service: Cardiovascular;  Laterality: N/A;  ? ? ?Current Outpatient Medications  ?Medication Sig Dispense Refill  ? acetaminophen (TYLENOL) 500 MG tablet Take 500-1,000 mg by mouth every 6 (six) hours as needed for moderate pain or headache.    ? Artificial Tear Ointment (DRY EYES OP) Place 1 drop into both eyes  daily as needed (Dry eye).    ? Ascorbic Acid (VITAMIN C) 1000 MG tablet Take 1,000 mg by mouth daily.      ? calcium carbonate (OSCAL) 1500 (600 Ca) MG TABS tablet Take 1 tablet by mouth daily.    ? cholecalciferol (VITAMIN D) 1000 UNITS tablet Take 1,000 Units by mouth daily.      ? Coenzyme Q10 (CO Q-10 PO) Take 1 tablet by mouth daily.    ? COVID-19 mRNA vaccine, Moderna, 100 MCG/0.5ML injection Inject into the muscle. 0.25 mL 0  ? diltiazem (CARDIZEM CD) 360 MG 24 hr capsule Take 1 capsule (360 mg total) by mouth daily. 90 capsule 2  ? diltiazem (CARDIZEM) 30 MG tablet Take 1/2 to 1 tablet every 4 hours AS NEEDED for heart rate >100 as long as top blood pressure >100. 45 tablet 1  ? famotidine (PEPCID) 20 MG tablet Take 20 mg by mouth daily.    ? flecainide (TAMBOCOR) 50 MG tablet Take 1 tablet by mouth twice daily 60 tablet 10  ? furosemide (LASIX) 20 MG tablet Take 1 tablet (20 mg total) by mouth daily as needed. 30 tablet 5  ? influenza vaccine adjuvanted (FLUAD QUADRIVALENT) 0.5 ML injection Inject into the muscle. 0.5 mL 0  ? levothyroxine (SYNTHROID) 50 MCG tablet TAKE 1  TABLET BY MOUTH ONCE A WEEK  WITH THE 75 MCG STRENGTH 6 DAYS A WK Oral 30 days (Patient taking differently: Take 50 mcg by mouth once a week.) 12 tablet 3  ? levothyroxine (SYNTHROID) 50 MCG tablet Take 1

## 2021-05-29 ENCOUNTER — Ambulatory Visit: Payer: Medicare Other | Admitting: Physician Assistant

## 2021-05-29 ENCOUNTER — Encounter: Payer: Self-pay | Admitting: Physician Assistant

## 2021-05-29 VITALS — BP 128/76 | HR 93 | Ht 60.0 in | Wt 124.0 lb

## 2021-05-29 DIAGNOSIS — I5032 Chronic diastolic (congestive) heart failure: Secondary | ICD-10-CM | POA: Diagnosis not present

## 2021-05-29 DIAGNOSIS — I5189 Other ill-defined heart diseases: Secondary | ICD-10-CM | POA: Diagnosis not present

## 2021-05-29 DIAGNOSIS — Z950 Presence of cardiac pacemaker: Secondary | ICD-10-CM

## 2021-05-29 DIAGNOSIS — I10 Essential (primary) hypertension: Secondary | ICD-10-CM | POA: Diagnosis not present

## 2021-05-29 DIAGNOSIS — I480 Paroxysmal atrial fibrillation: Secondary | ICD-10-CM | POA: Diagnosis not present

## 2021-05-29 DIAGNOSIS — Z5181 Encounter for therapeutic drug level monitoring: Secondary | ICD-10-CM

## 2021-05-29 DIAGNOSIS — Z79899 Other long term (current) drug therapy: Secondary | ICD-10-CM

## 2021-05-29 LAB — CUP PACEART INCLINIC DEVICE CHECK
Battery Remaining Longevity: 129 mo
Battery Voltage: 3.01 V
Brady Statistic AP VP Percent: 75.55 %
Brady Statistic AP VS Percent: 0.89 %
Brady Statistic AS VP Percent: 0.67 %
Brady Statistic AS VS Percent: 22.88 %
Brady Statistic RA Percent Paced: 76.36 %
Brady Statistic RV Percent Paced: 76.11 %
Date Time Interrogation Session: 20230421112740
Implantable Lead Implant Date: 20101109
Implantable Lead Implant Date: 20101109
Implantable Lead Location: 753859
Implantable Lead Location: 753860
Implantable Lead Model: 4469
Implantable Lead Model: 4470
Implantable Lead Serial Number: 535369
Implantable Lead Serial Number: 657967
Implantable Pulse Generator Implant Date: 20210708
Lead Channel Impedance Value: 285 Ohm
Lead Channel Impedance Value: 456 Ohm
Lead Channel Impedance Value: 456 Ohm
Lead Channel Impedance Value: 513 Ohm
Lead Channel Pacing Threshold Amplitude: 0.75 V
Lead Channel Pacing Threshold Amplitude: 1 V
Lead Channel Pacing Threshold Pulse Width: 0.4 ms
Lead Channel Pacing Threshold Pulse Width: 0.4 ms
Lead Channel Sensing Intrinsic Amplitude: 0.25 mV
Lead Channel Sensing Intrinsic Amplitude: 0.25 mV
Lead Channel Sensing Intrinsic Amplitude: 11 mV
Lead Channel Sensing Intrinsic Amplitude: 11.75 mV
Lead Channel Setting Pacing Amplitude: 2 V
Lead Channel Setting Pacing Amplitude: 2.5 V
Lead Channel Setting Pacing Pulse Width: 0.4 ms
Lead Channel Setting Sensing Sensitivity: 1.2 mV

## 2021-05-29 NOTE — Patient Instructions (Signed)
?   Medication Instructions:  ? ? ?Your physician recommends that you continue on your current medications as directed. Please refer to the Current Medication list given to you today. ? ?*If you need a refill on your cardiac medications before your next appointment, please call your pharmacy* ? ? ?Lab Work:  [place] ? ? ?If you have labs (blood work) drawn today and your tests are completely normal, you will receive your results only by: ?MyChart Message (if you have MyChart) OR ?A paper copy in the mail ?If you have any lab test that is abnormal or we need to change your treatment, we will call you to review the results. ? ? ?Testing/Procedures: NONE ORDERED  TODAY ? ? ?Follow-Up: ?At [facility], you and your health needs are our priority.  As part of our continuing mission to provide you with exceptional heart care, we have created designated Provider Care Teams.  These Care Teams include your primary Cardiologist (physician) and Advanced Practice Providers (APPs -  Physician Assistants and Nurse Practitioners) who all work together to provide you with the care you need, when you need it. ? ?We recommend signing up for the patient portal called "MyChart".  Sign up information is provided on this After Visit Summary.  MyChart is used to connect with patients for Virtual Visits (Telemedicine).  Patients are able to view lab/test results, encounter notes, upcoming appointments, etc.  Non-urgent messages can be sent to your provider as well.   ?To learn more about what you can do with MyChart, go to [email or web address].   ? ?Your next appointment:   ?6 month(s) ? ?The format for your next appointment:   ?In Person ? ?Provider:   ?Thompson Grayer, MD  ? ? ?Other Instructions ? ? ?Important Information About Sugar ? ? ? ? ?  ?

## 2021-05-29 NOTE — Progress Notes (Signed)
Remote pacemaker transmission.   

## 2021-06-01 ENCOUNTER — Other Ambulatory Visit (HOSPITAL_BASED_OUTPATIENT_CLINIC_OR_DEPARTMENT_OTHER): Payer: Self-pay

## 2021-06-01 ENCOUNTER — Other Ambulatory Visit: Payer: Self-pay | Admitting: Student

## 2021-06-02 ENCOUNTER — Other Ambulatory Visit (HOSPITAL_BASED_OUTPATIENT_CLINIC_OR_DEPARTMENT_OTHER): Payer: Self-pay

## 2021-06-02 MED ORDER — FLECAINIDE ACETATE 50 MG PO TABS
ORAL_TABLET | ORAL | 11 refills | Status: DC
  Filled 2021-06-02: qty 60, 30d supply, fill #0
  Filled 2021-07-07: qty 60, 30d supply, fill #1
  Filled 2021-08-03: qty 60, 30d supply, fill #2
  Filled 2021-09-07: qty 60, 30d supply, fill #3
  Filled 2021-10-05: qty 120, 60d supply, fill #4
  Filled 2021-12-01: qty 120, 60d supply, fill #5
  Filled 2022-02-03: qty 120, 60d supply, fill #6
  Filled 2022-04-05: qty 120, 60d supply, fill #7

## 2021-06-03 DIAGNOSIS — R109 Unspecified abdominal pain: Secondary | ICD-10-CM | POA: Diagnosis not present

## 2021-06-05 ENCOUNTER — Ambulatory Visit
Admission: RE | Admit: 2021-06-05 | Discharge: 2021-06-05 | Disposition: A | Discharge status: Home / Self Care | Payer: Medicare Other | Source: Ambulatory Visit | Attending: Internal Medicine | Admitting: Internal Medicine

## 2021-06-05 DIAGNOSIS — M4316 Spondylolisthesis, lumbar region: Secondary | ICD-10-CM | POA: Diagnosis not present

## 2021-06-05 DIAGNOSIS — K449 Diaphragmatic hernia without obstruction or gangrene: Secondary | ICD-10-CM | POA: Diagnosis not present

## 2021-06-05 DIAGNOSIS — K869 Disease of pancreas, unspecified: Secondary | ICD-10-CM

## 2021-06-05 DIAGNOSIS — N289 Disorder of kidney and ureter, unspecified: Secondary | ICD-10-CM | POA: Diagnosis not present

## 2021-06-05 DIAGNOSIS — K8689 Other specified diseases of pancreas: Secondary | ICD-10-CM | POA: Diagnosis not present

## 2021-06-05 MED ORDER — IOPAMIDOL (ISOVUE-300) INJECTION 61%
100.0000 mL | Freq: Once | INTRAVENOUS | Status: AC | PRN
  Administered 2021-06-05: 100 mL via INTRAVENOUS

## 2021-06-08 ENCOUNTER — Other Ambulatory Visit (HOSPITAL_BASED_OUTPATIENT_CLINIC_OR_DEPARTMENT_OTHER): Payer: Self-pay

## 2021-06-18 ENCOUNTER — Other Ambulatory Visit (HOSPITAL_COMMUNITY): Payer: Self-pay | Admitting: Nurse Practitioner

## 2021-06-18 ENCOUNTER — Other Ambulatory Visit (HOSPITAL_BASED_OUTPATIENT_CLINIC_OR_DEPARTMENT_OTHER): Payer: Self-pay

## 2021-06-18 MED ORDER — DILTIAZEM HCL 30 MG PO TABS
ORAL_TABLET | ORAL | 3 refills | Status: AC
  Filled 2021-06-18: qty 45, 8d supply, fill #0

## 2021-06-24 DIAGNOSIS — N281 Cyst of kidney, acquired: Secondary | ICD-10-CM | POA: Diagnosis not present

## 2021-06-24 DIAGNOSIS — R3914 Feeling of incomplete bladder emptying: Secondary | ICD-10-CM | POA: Diagnosis not present

## 2021-07-03 ENCOUNTER — Other Ambulatory Visit (HOSPITAL_BASED_OUTPATIENT_CLINIC_OR_DEPARTMENT_OTHER): Payer: Self-pay

## 2021-07-03 DIAGNOSIS — J310 Chronic rhinitis: Secondary | ICD-10-CM | POA: Diagnosis not present

## 2021-07-03 MED ORDER — MUPIROCIN 2 % EX OINT
TOPICAL_OINTMENT | CUTANEOUS | 3 refills | Status: DC
  Filled 2021-07-03: qty 22, 10d supply, fill #0

## 2021-07-07 ENCOUNTER — Other Ambulatory Visit (HOSPITAL_BASED_OUTPATIENT_CLINIC_OR_DEPARTMENT_OTHER): Payer: Self-pay

## 2021-07-08 DIAGNOSIS — E039 Hypothyroidism, unspecified: Secondary | ICD-10-CM | POA: Diagnosis not present

## 2021-07-08 DIAGNOSIS — I480 Paroxysmal atrial fibrillation: Secondary | ICD-10-CM | POA: Diagnosis not present

## 2021-07-08 DIAGNOSIS — I10 Essential (primary) hypertension: Secondary | ICD-10-CM | POA: Diagnosis not present

## 2021-07-08 DIAGNOSIS — I5032 Chronic diastolic (congestive) heart failure: Secondary | ICD-10-CM | POA: Diagnosis not present

## 2021-07-21 ENCOUNTER — Other Ambulatory Visit (HOSPITAL_BASED_OUTPATIENT_CLINIC_OR_DEPARTMENT_OTHER): Payer: Self-pay

## 2021-07-22 ENCOUNTER — Other Ambulatory Visit (HOSPITAL_BASED_OUTPATIENT_CLINIC_OR_DEPARTMENT_OTHER): Payer: Self-pay

## 2021-07-23 DIAGNOSIS — J310 Chronic rhinitis: Secondary | ICD-10-CM | POA: Diagnosis not present

## 2021-07-23 DIAGNOSIS — R040 Epistaxis: Secondary | ICD-10-CM | POA: Diagnosis not present

## 2021-07-29 ENCOUNTER — Encounter (HOSPITAL_BASED_OUTPATIENT_CLINIC_OR_DEPARTMENT_OTHER): Payer: Self-pay

## 2021-07-29 ENCOUNTER — Emergency Department (HOSPITAL_BASED_OUTPATIENT_CLINIC_OR_DEPARTMENT_OTHER)
Admission: EM | Admit: 2021-07-29 | Discharge: 2021-07-29 | Disposition: A | Discharge status: Home / Self Care | Payer: Medicare Other | Attending: Emergency Medicine | Admitting: Emergency Medicine

## 2021-07-29 ENCOUNTER — Other Ambulatory Visit: Payer: Self-pay

## 2021-07-29 ENCOUNTER — Emergency Department (HOSPITAL_BASED_OUTPATIENT_CLINIC_OR_DEPARTMENT_OTHER): Payer: Medicare Other

## 2021-07-29 ENCOUNTER — Other Ambulatory Visit (HOSPITAL_BASED_OUTPATIENT_CLINIC_OR_DEPARTMENT_OTHER): Payer: Self-pay

## 2021-07-29 ENCOUNTER — Other Ambulatory Visit (HOSPITAL_BASED_OUTPATIENT_CLINIC_OR_DEPARTMENT_OTHER): Payer: Self-pay | Admitting: Cardiovascular Disease

## 2021-07-29 DIAGNOSIS — K5900 Constipation, unspecified: Secondary | ICD-10-CM | POA: Diagnosis not present

## 2021-07-29 DIAGNOSIS — Z28311 Partially vaccinated for covid-19: Secondary | ICD-10-CM | POA: Insufficient documentation

## 2021-07-29 DIAGNOSIS — N2889 Other specified disorders of kidney and ureter: Secondary | ICD-10-CM | POA: Diagnosis not present

## 2021-07-29 DIAGNOSIS — Z79899 Other long term (current) drug therapy: Secondary | ICD-10-CM | POA: Insufficient documentation

## 2021-07-29 DIAGNOSIS — K8689 Other specified diseases of pancreas: Secondary | ICD-10-CM | POA: Diagnosis not present

## 2021-07-29 LAB — BASIC METABOLIC PANEL
Anion gap: 10 (ref 5–15)
BUN: 6 mg/dL — ABNORMAL LOW (ref 8–23)
CO2: 27 mmol/L (ref 22–32)
Calcium: 9.6 mg/dL (ref 8.9–10.3)
Chloride: 98 mmol/L (ref 98–111)
Creatinine, Ser: 0.77 mg/dL (ref 0.44–1.00)
GFR, Estimated: >60 mL/min (ref >60)
Glucose, Bld: 107 mg/dL — ABNORMAL HIGH (ref 70–99)
Potassium: 4.1 mmol/L (ref 3.5–5.1)
Sodium: 135 mmol/L (ref 135–145)

## 2021-07-29 LAB — CBC WITH DIFFERENTIAL/PLATELET
Abs Immature Granulocytes: 0.02 10*3/uL (ref 0.00–0.07)
Basophils Absolute: 0 10*3/uL (ref 0.0–0.1)
Basophils Relative: 0 %
Eosinophils Absolute: 0.1 10*3/uL (ref 0.0–0.5)
Eosinophils Relative: 1 %
HCT: 37.7 % (ref 36.0–46.0)
Hemoglobin: 12.3 g/dL (ref 12.0–15.0)
Immature Granulocytes: 0 %
Lymphocytes Relative: 19 %
Lymphs Abs: 1.4 10*3/uL (ref 0.7–4.0)
MCH: 27.8 pg (ref 26.0–34.0)
MCHC: 32.6 g/dL (ref 30.0–36.0)
MCV: 85.1 fL (ref 80.0–100.0)
Monocytes Absolute: 1.1 10*3/uL — ABNORMAL HIGH (ref 0.1–1.0)
Monocytes Relative: 14 %
Neutro Abs: 4.7 10*3/uL (ref 1.7–7.7)
Neutrophils Relative %: 66 %
Platelets: 218 10*3/uL (ref 150–400)
RBC: 4.43 MIL/uL (ref 3.87–5.11)
RDW: 14.3 % (ref 11.5–15.5)
WBC: 7.3 10*3/uL (ref 4.0–10.5)
nRBC: 0 % (ref 0.0–0.2)

## 2021-07-29 MED ORDER — FLEET ENEMA 7-19 GM/118ML RE ENEM
1.0000 | ENEMA | Freq: Once | RECTAL | Status: AC
  Administered 2021-07-29: 1 via RECTAL
  Filled 2021-07-29: qty 1

## 2021-07-29 MED ORDER — FLEET ENEMA 7-19 GM/118ML RE ENEM
1.0000 | ENEMA | Freq: Once | RECTAL | 0 refills | Status: AC
  Filled 2021-07-29: qty 133, 1d supply, fill #0

## 2021-07-29 MED ORDER — IOHEXOL 300 MG/ML  SOLN
100.0000 mL | Freq: Once | INTRAMUSCULAR | Status: AC | PRN
  Administered 2021-07-29: 75 mL via INTRAVENOUS

## 2021-07-29 NOTE — Discharge Instructions (Signed)
Additional Fleet enema sent to pharmacy in case you continue to have constipation over the next several weeks.

## 2021-07-29 NOTE — ED Provider Notes (Signed)
[facility] EMERGENC[facility] DEPT Provider Note   CSN: [id number] Arrival date & time: 07/29/21  3893     History  Chief Complaint  Patient presents with   Constipation    Kathryn Gibson is a 88 y.o. female.  Patient is an 88 year old female presenting for complaints of constipation.  Patient admits to struggling with constipation for several years.  States she takes 2 packets of MiraLAX daily and often drinks hot prune juice.  States she is had minimal improvement of constipation over the last 2 days.  Patient admits to watery diarrhea around she assumes is constipation.  Denies any nausea or vomiting.  Admits to abdominal bloating.  The history is provided by the patient. No language interpreter was used.  Constipation Associated symptoms: no abdominal pain, no back pain, no dysuria, no fever and no vomiting        Home Medications Prior to Admission medications   Medication Sig Start Date End Date Taking? Authorizing Provider  sodium phosphate (FLEET) 7-19 GM/118ML ENEM Place 133 mLs (1 enema total) rectally once for 1 dose. 07/29/21 7/34/28 Yes Campbell Stall P, DO  acetaminophen (TYLENOL) 500 MG tablet Take 500-1,000 mg by mouth every 6 (six) hours as needed for moderate pain or headache.    [provider]  Artificial Tear Ointment (DRY EYES OP) Place 1 drop into both eyes daily as needed (Dry eye).    [provider]  Ascorbic Acid (VITAMIN C) 1000 MG tablet Take 1,000 mg by mouth daily.      [provider]  calcium carbonate (OSCAL) 1500 (600 Ca) MG TABS tablet Take 1 tablet by mouth daily. 12/19/08   [provider]  cholecalciferol (VITAMIN D) 1000 UNITS tablet Take 1,000 Units by mouth daily.      [provider]  Coenzyme Q10 (CO Q-10 PO) Take 1 tablet by mouth daily.    [provider]  COVID-19 mRNA vaccine, Moderna, 100 MCG/0.5ML injection Inject into the muscle. 09/04/20   Carlyle Basques, MD  diltiazem  (CARDIZEM CD) 360 MG 24 hr capsule Take 1 capsule (360 mg total) by mouth daily. 02/24/21   [name], MD  diltiazem (CARDIZEM) 30 MG tablet Take 1/2 to 1 tablet every 4 hours AS NEEDED for heart rate >100 as long as top blood pressure >100. 06/18/21   Baldwin Jamaica, PA-C  famotidine (PEPCID) 20 MG tablet Take 20 mg by mouth daily.    [provider]  flecainide (TAMBOCOR) 50 MG tablet Take 1 tablet by mouth twice daily 06/02/21   Baldwin Jamaica, PA-C  furosemide (LASIX) 20 MG tablet Take 1 tablet (20 mg total) by mouth daily as needed. 11/17/20 08/04/21  [name], MD  influenza vaccine adjuvanted (FLUAD QUADRIVALENT) 0.5 ML injection Inject into the muscle. 11/03/20     levothyroxine (SYNTHROID) 50 MCG tablet Take 1 tablet in the morning on an empty stomach by mouth once a week for 90 days 04/13/21     levothyroxine (SYNTHROID) 75 MCG tablet TAKE 75 MCG BY MOUTH FOR 6 DAYS A WEEK (TAKE 50 MCG THE REMAINING 1 DAY OF THE WEEK). Oral 90 days 10/03/20     methylcellulose (CITRUCEL) oral powder Take 2 packets by mouth daily.    [provider]  metoprolol succinate (TOPROL XL) 50 MG 24 hr tablet Take '75mg'$  TWICE A DAY 02/17/21   Baldwin Jamaica, PA-C  Multiple Vitamins-Minerals (ICAPS AREDS 2 PO) Take 1 capsule by mouth daily.  [provider]  mupirocin ointment (BACTROBAN) 2 % Apply to nose 3 times daily 07/03/21     Omega-3 Fatty Acids (FISH OIL) 1200 MG CAPS Take 1,200 mg by mouth daily. Mega Red.    [provider]  polyethylene glycol powder (GLYCOLAX/MIRALAX) powder Take 0.5 Containers by mouth at bedtime.     [provider]  pravastatin (PRAVACHOL) 10 MG tablet Take 1 tablet by mouth once daily 04/15/21   Allred, Jeneen Rinks, MD  Probiotic Product (ALIGN PO) Take 1 capsule by mouth daily.    [provider]  Rivaroxaban (XARELTO) 15 MG TABS tablet Take 1 tablet (15 mg total) by mouth daily with supper. 03/31/21   [name],  MD  sodium chloride (OCEAN) 0.65 % SOLN nasal spray Place 1 spray into both nostrils daily.    [provider]  temazepam (RESTORIL) 30 MG capsule TAKE 1 CAPSULE BY MOUTH IN THE EVENING Orally Once a day 90 days 02/25/21     Zinc 50 MG CAPS Take 50 mg by mouth daily.  01/22/19   [provider]      Allergies    Trazodone and nefazodone    Review of Systems   Review of Systems  Constitutional:  Negative for chills and fever.  HENT:  Negative for ear pain and sore throat.   Eyes:  Negative for pain and visual disturbance.  Respiratory:  Negative for cough and shortness of breath.   Cardiovascular:  Negative for chest pain and palpitations.  Gastrointestinal:  Positive for constipation. Negative for abdominal pain and vomiting.  Genitourinary:  Negative for dysuria and hematuria.  Musculoskeletal:  Negative for arthralgias and back pain.  Skin:  Negative for color change and rash.  Neurological:  Negative for seizures and syncope.  All other systems reviewed and are negative.   Physical Exam Updated Vital Signs BP 136/69   Pulse (!) 56   Temp 98.3 F (36.8 C) (Oral)   Resp 14   SpO2 97%  Physical Exam Vitals and nursing note reviewed.  Constitutional:      General: She is not in acute distress.    Appearance: She is well-developed.  HENT:     Head: Normocephalic and atraumatic.  Eyes:     Conjunctiva/sclera: Conjunctivae normal.  Cardiovascular:     Rate and Rhythm: Normal rate and regular rhythm.     Heart sounds: No murmur heard. Pulmonary:     Effort: Pulmonary effort is normal. No respiratory distress.     Breath sounds: Normal breath sounds.  Abdominal:     Palpations: Abdomen is soft.     Tenderness: There is no abdominal tenderness.  Musculoskeletal:        General: No swelling.     Cervical back: Neck supple.  Skin:    General: Skin is warm and dry.     Capillary Refill: Capillary refill takes less than 2 seconds.  Neurological:      Mental Status: She is alert.  Psychiatric:        Mood and Affect: Mood normal.     ED Results / Procedures / Treatments   Labs (all labs ordered are listed, but only abnormal results are displayed) Labs Reviewed  CBC WITH DIFFERENTIAL/PLATELET - Abnormal; Notable for the following components:      Result Value   Monocytes Absolute 1.1 (*)    All other components within normal limits  BASIC METABOLIC PANEL - Abnormal; Notable for the following components:   Glucose, Bld 107 (*)  BUN 6 (*)    All other components within normal limits    EKG None  Radiology CT ABDOMEN PELVIS W CONTRAST  Result Date: 07/29/2021 CLINICAL DATA:  Constipation for 2 weeks with occasional watery diarrhea. History of irritable bowel syndrome. EXAM: CT ABDOMEN AND PELVIS WITH CONTRAST TECHNIQUE: Multidetector CT imaging of the abdomen and pelvis was performed using the standard protocol following bolus administration of intravenous contrast. RADIATION DOSE REDUCTION: This exam was performed according to the departmental dose-optimization program which includes automated exposure control, adjustment of the mA and/or kV according to patient size and/or use of iterative reconstruction technique. CONTRAST:  16m OMNIPAQUE IOHEXOL 300 MG/ML  SOLN COMPARISON:  Abdominopelvic CT 06/05/2021 and 05/09/2020 FINDINGS: Lower chest: Stable mild scarring at both lung bases. The heart is enlarged. Pacemaker extends to the right ventricular apex. There is mild aortic and coronary artery atherosclerosis. Hepatobiliary: The liver is normal in density without suspicious focal abnormality. No evidence of gallstones, gallbladder wall thickening or biliary dilatation. Pancreas: Stable 8 mm cystic lesion in the pancreatic head on image 25/2. In the proximal pancreatic tail, there is a small cystic lesion measuring 1.7 x 1.3 cm on image 16/2. No evidence of solid pancreatic mass, ductal dilatation or surrounding inflammation. Spleen:  Normal in size without focal abnormality. Adrenals/Urinary Tract: Stable adrenal hyperplasia, greater on the left. Indeterminate lesion in the lower pole of the right kidney measures 1.3 x 1.0 cm on image 29/7. This demonstrates heterogeneous internal density and appears mildly enlarged from 05/09/2020. The left kidney appears normal. No evidence of urinary tract calculus or hydronephrosis. The bladder is mildly distended without focal abnormality. Stomach/Bowel: No enteric contrast administered. The stomach appears unremarkable for its degree of distension. No evidence of bowel wall thickening, distention or surrounding inflammatory change. Vascular/Lymphatic: There are no enlarged abdominal or pelvic lymph nodes. Diffuse aortic and branch vessel atherosclerosis without evidence of aneurysm or large vessel occlusion. Reproductive: The uterus and ovaries appear unremarkable. No adnexal mass. Other: No evidence of abdominal wall mass or hernia. No ascites. Musculoskeletal: No acute or significant osseous findings. Stable T12 compression fracture with osseous retropulsion. Stable degenerative anterolisthesis at L4-5. IMPRESSION: 1. No acute findings or explanation for the patient's symptoms. 2. No significant changes are seen compared with the recent examination of 8 weeks ago. 3. As demonstrated on the recent prior study, there is an enlarging indeterminate lesion in the lower pole of the right kidney which could reflect a complex cyst or small solid lesion. Per the previous report, the patient is unable to undergo MRI. Suggest follow-renal CT (without and with contrast) in 6 months to assess this lesion. 4. Stable likely benign pancreatic cysts can be addressed on follow-up CT. 5.  Aortic Atherosclerosis (ICD10-I70.0). 6. Stable T12 compression fracture. Electronically Signed   By: WRichardean SaleM.D.   On: 07/29/2021 12:43    Procedures Procedures    Medications Ordered in ED Medications  iohexol  (OMNIPAQUE) 300 MG/ML solution 100 mL (75 mLs Intravenous Contrast Given 07/29/21 1208)  sodium phosphate (FLEET) 7-19 GM/118ML enema 1 enema (1 enema Rectal Given 07/29/21 1312)    ED Course/ Medical Decision Making/ A&P                           Medical Decision Making Amount and/or Complexity of Data Reviewed Labs: ordered. Radiology: ordered.  Risk OTC drugs. Prescription drug management.   [id number] 88year old female presenting for complaints of constipation.  She is alert and oriented x3, no acute distress, afebrile, stable vital signs.  Abdomen is soft and nontender.    Stable laboratory studies no signs or symptoms of sepsis.  CT abdomen demonstrates no acute process.  No bowel obstruction.  No fecal impaction.  Enema given.   Patient had bowel movement in emergency department.  Ready for discharge.  Patient in no distress and overall condition improved here in the ED. Detailed discussions were had with the patient regarding current findings, and need for close f/u with PCP or on call doctor. The patient has been instructed to return immediately if the symptoms worsen in any way for re-evaluation. Patient verbalized understanding and is in agreement with current care plan. All questions answered prior to discharge.        Final Clinical Impression(s) / ED Diagnoses Final diagnoses:  Constipation, unspecified constipation type    Rx / DC Orders ED Discharge Orders          Ordered    sodium phosphate (FLEET) 7-19 GM/118ML ENEM   Once        07/29/21 [place], [place] P, DO 70/62/37 1347

## 2021-07-29 NOTE — ED Triage Notes (Signed)
She c/o issues with constipation x ~ 2 weeks. She states she is able to pass only "like water". She is ambulatory and in no distress. She sees Dr. Henrene Pastor for her gastrointestinal issues. She denies fever/n/v.

## 2021-07-30 ENCOUNTER — Other Ambulatory Visit (HOSPITAL_BASED_OUTPATIENT_CLINIC_OR_DEPARTMENT_OTHER): Payer: Self-pay

## 2021-07-30 MED ORDER — FUROSEMIDE 20 MG PO TABS
20.0000 mg | ORAL_TABLET | Freq: Every day | ORAL | 5 refills | Status: DC | PRN
  Filled 2021-07-30: qty 30, 30d supply, fill #0
  Filled 2021-08-25: qty 30, 30d supply, fill #1
  Filled 2021-10-05: qty 30, 30d supply, fill #2

## 2021-07-30 NOTE — Telephone Encounter (Signed)
Rx(s) sent to pharmacy electronically.  

## 2021-07-31 ENCOUNTER — Telehealth: Payer: Self-pay | Admitting: Internal Medicine

## 2021-08-03 ENCOUNTER — Other Ambulatory Visit (HOSPITAL_BASED_OUTPATIENT_CLINIC_OR_DEPARTMENT_OTHER): Payer: Self-pay

## 2021-08-07 DIAGNOSIS — I480 Paroxysmal atrial fibrillation: Secondary | ICD-10-CM | POA: Diagnosis not present

## 2021-08-07 DIAGNOSIS — D649 Anemia, unspecified: Secondary | ICD-10-CM | POA: Diagnosis not present

## 2021-08-07 DIAGNOSIS — Z1339 Encounter for screening examination for other mental health and behavioral disorders: Secondary | ICD-10-CM | POA: Diagnosis not present

## 2021-08-07 DIAGNOSIS — Z1331 Encounter for screening for depression: Secondary | ICD-10-CM | POA: Diagnosis not present

## 2021-08-07 DIAGNOSIS — K5909 Other constipation: Secondary | ICD-10-CM | POA: Diagnosis not present

## 2021-08-07 DIAGNOSIS — N281 Cyst of kidney, acquired: Secondary | ICD-10-CM | POA: Diagnosis not present

## 2021-08-14 ENCOUNTER — Ambulatory Visit (INDEPENDENT_AMBULATORY_CARE_PROVIDER_SITE_OTHER): Payer: Medicare Other

## 2021-08-14 DIAGNOSIS — I495 Sick sinus syndrome: Secondary | ICD-10-CM | POA: Diagnosis not present

## 2021-08-15 LAB — CUP PACEART REMOTE DEVICE CHECK
Battery Remaining Longevity: 120 mo
Battery Voltage: 3.01 V
Brady Statistic AP VP Percent: 83.9 %
Brady Statistic AP VS Percent: 0.17 %
Brady Statistic AS VP Percent: 0.76 %
Brady Statistic AS VS Percent: 15.16 %
Brady Statistic RA Percent Paced: 84.23 %
Brady Statistic RV Percent Paced: 84.56 %
Date Time Interrogation Session: 20230706221346
Implantable Lead Implant Date: 20101109
Implantable Lead Implant Date: 20101109
Implantable Lead Location: 753859
Implantable Lead Location: 753860
Implantable Lead Model: 4469
Implantable Lead Model: 4470
Implantable Lead Serial Number: 535369
Implantable Lead Serial Number: 657967
Implantable Pulse Generator Implant Date: 20210708
Lead Channel Impedance Value: 266 Ohm
Lead Channel Impedance Value: 399 Ohm
Lead Channel Impedance Value: 399 Ohm
Lead Channel Impedance Value: 437 Ohm
Lead Channel Pacing Threshold Amplitude: 0.75 V
Lead Channel Pacing Threshold Amplitude: 1 V
Lead Channel Pacing Threshold Pulse Width: 0.4 ms
Lead Channel Pacing Threshold Pulse Width: 0.4 ms
Lead Channel Sensing Intrinsic Amplitude: 0.25 mV
Lead Channel Sensing Intrinsic Amplitude: 0.25 mV
Lead Channel Sensing Intrinsic Amplitude: 3.875 mV
Lead Channel Sensing Intrinsic Amplitude: 3.875 mV
Lead Channel Setting Pacing Amplitude: 2 V
Lead Channel Setting Pacing Amplitude: 2.5 V
Lead Channel Setting Pacing Pulse Width: 0.4 ms
Lead Channel Setting Sensing Sensitivity: 1.2 mV

## 2021-08-17 ENCOUNTER — Encounter: Payer: Self-pay | Admitting: Internal Medicine

## 2021-08-21 ENCOUNTER — Other Ambulatory Visit (HOSPITAL_BASED_OUTPATIENT_CLINIC_OR_DEPARTMENT_OTHER): Payer: Self-pay

## 2021-08-24 ENCOUNTER — Other Ambulatory Visit (HOSPITAL_BASED_OUTPATIENT_CLINIC_OR_DEPARTMENT_OTHER): Payer: Self-pay

## 2021-08-24 MED FILL — Diltiazem HCl Coated Beads Cap ER 24HR 360 MG: ORAL | 90 days supply | Qty: 90 | Fill #2 | Status: AC

## 2021-08-25 ENCOUNTER — Ambulatory Visit: Payer: Medicare Other | Admitting: Physician Assistant

## 2021-08-25 ENCOUNTER — Other Ambulatory Visit (HOSPITAL_BASED_OUTPATIENT_CLINIC_OR_DEPARTMENT_OTHER): Payer: Self-pay

## 2021-08-28 NOTE — Progress Notes (Signed)
Remote pacemaker transmission.   

## 2021-09-01 ENCOUNTER — Other Ambulatory Visit (HOSPITAL_BASED_OUTPATIENT_CLINIC_OR_DEPARTMENT_OTHER): Payer: Self-pay

## 2021-09-02 ENCOUNTER — Other Ambulatory Visit (HOSPITAL_BASED_OUTPATIENT_CLINIC_OR_DEPARTMENT_OTHER): Payer: Self-pay

## 2021-09-07 ENCOUNTER — Other Ambulatory Visit (HOSPITAL_BASED_OUTPATIENT_CLINIC_OR_DEPARTMENT_OTHER): Payer: Self-pay

## 2021-09-16 ENCOUNTER — Other Ambulatory Visit (HOSPITAL_BASED_OUTPATIENT_CLINIC_OR_DEPARTMENT_OTHER): Payer: Self-pay

## 2021-09-16 DIAGNOSIS — J0100 Acute maxillary sinusitis, unspecified: Secondary | ICD-10-CM | POA: Diagnosis not present

## 2021-09-16 DIAGNOSIS — Z1152 Encounter for screening for COVID-19: Secondary | ICD-10-CM | POA: Diagnosis not present

## 2021-09-16 DIAGNOSIS — I10 Essential (primary) hypertension: Secondary | ICD-10-CM | POA: Diagnosis not present

## 2021-09-16 MED ORDER — CEFDINIR 300 MG PO CAPS
ORAL_CAPSULE | ORAL | 0 refills | Status: DC
  Filled 2021-09-16: qty 14, 7d supply, fill #0

## 2021-09-21 ENCOUNTER — Other Ambulatory Visit (HOSPITAL_BASED_OUTPATIENT_CLINIC_OR_DEPARTMENT_OTHER): Payer: Self-pay

## 2021-10-05 ENCOUNTER — Other Ambulatory Visit (HOSPITAL_BASED_OUTPATIENT_CLINIC_OR_DEPARTMENT_OTHER): Payer: Self-pay

## 2021-10-08 DIAGNOSIS — I480 Paroxysmal atrial fibrillation: Secondary | ICD-10-CM | POA: Diagnosis not present

## 2021-10-08 DIAGNOSIS — R0981 Nasal congestion: Secondary | ICD-10-CM | POA: Diagnosis not present

## 2021-10-08 DIAGNOSIS — R5383 Other fatigue: Secondary | ICD-10-CM | POA: Diagnosis not present

## 2021-10-08 DIAGNOSIS — I5032 Chronic diastolic (congestive) heart failure: Secondary | ICD-10-CM | POA: Diagnosis not present

## 2021-10-09 ENCOUNTER — Other Ambulatory Visit (HOSPITAL_BASED_OUTPATIENT_CLINIC_OR_DEPARTMENT_OTHER): Payer: Self-pay

## 2021-10-13 NOTE — Progress Notes (Signed)
Cardiology Office Note:    Date:  10/14/2021   ID:  Kathryn Gibson, DOB [birth year], MRN [id number]  PCP:  Prince Solian, MD   [facility] Providers Cardiologist:  [name], MD Electrophysiologist:  Thompson Grayer, MD     Referring MD: Prince Solian, MD   No chief complaint on file.   History of Present Illness:    Kathryn Gibson is a 88 y.o. female with a hx of atrial fibrillation s/p ablation, SSS s/p PPM, asymptomatic coronary calcifications, and  hypertension, who presents for follow-up.  Kathryn Gibson  was previously a patient of Dr. Mare Ferrari.  She underwent ablation for atrial fibrillation on 07/01/15.  She had an episode of atrial fibrillation 12/2015.  Amiodarone was previously increased due to atrial tachycardia.  This was discontinued due to elevated LFTs which subsequently normalized.  She saw Dr. Margaretann Loveless 07/2018 and reported exertional dyspnea.  Dr. Margaretann Loveless offered stress testing which she wanted to think about.  Her device was interrogated 01/08/19 and was nearing ERI.  There were no episodes of atrial fibrillation noted.   Her generator was changed out 08/2019.  Afterward she reported feeling poorly and felt like her heart was racing, though is in sinus rhythm in the 90s.  Metoprolol was increased.  She saw Dr. Rayann Heman on 05/2020 and was doing well.  Her A. fib burden was at 3.4%.  On her remote check 08/2020 it was 10.6%. She followed up with EP on 11/2020 and they did not make any changes. She had some chest tightness that was thought to be due to her thoracic spine fracture. She had a nuclear stress test 09/2020 that was normal.   At her last visit she reported some LE edema. BNP was 310. Lasix was increased to 40 mg. She followed up with EP 05/2021 and was doing well with minimal Afib.   Today, she reports having epistaxis for the past 2 hours. Currently she states that her nasal passages still feel wet. Since her remote fall 7 years ago with subsequent nasal surgery,  she has had intermittent nosebleeds and production of green, scab-like mucous. At home her blood pressure is usually in the 426'S systolic, rarely as high as 133/73. Regarding her LE edema she believes her 20 mg furosemide is not very effective overall. However, she notes the level of swelling today is very good which she attributes to increased urination yesterday (more than usual). She has been drinking about 72 oz of water a day. She states she is still unable to complete her walks. She was given a new inhaler and she has had a hoarse voice since then. She quit smoking 35 years ago. She is concerned that recent imaging showed she had beginning stages of COPD. She denies any palpitations, chest pain, or shortness of breath. No lightheadedness, headaches, syncope, orthopnea, or PND.   Past Medical History:  Diagnosis Date   Arthritis    Atrial fibrillation ([place])    Atypical chest pain 09/22/2020   Blood transfusion without reported diagnosis    2017   Bradycardia    s/p PPM   CAD in native artery 03/26/2021   Cataract    Chronic diastolic heart failure ([place]) 03/26/2021   Colonic polyp    adenomatous   Constipation    Diverticulosis    Hemorrhoids    Hyperlipidemia    Hypertension    Hypothyroidism    Irritable bowel syndrome (IBS)    Lower extremity edema 09/22/2020   Osteoporosis  osteopenia   Pacemaker 2010   UTI (urinary tract infection)     Past Surgical History:  Procedure Laterality Date   BREAST CYST ASPIRATION Left 1980s   COLONOSCOPY     ELECTROPHYSIOLOGIC STUDY N/A 07/01/2015   Procedure: Atrial Fibrillation Ablation;  Surgeon: Thompson Grayer, MD;  Location: [place] [facility];  Service: Cardiovascular;  Laterality: N/A;   EYE SURGERY     heart ablation     for atrial fib   HEMATOMA EVACUATION Right 07/15/2014   Procedure: EVACUATION Right Groin Hematoma;  Surgeon: Rosetta Posner, MD;  Location: [facility];  Service: Vascular;  Laterality: Right;   NASAL ENDOSCOPY WITH  EPISTAXIS CONTROL N/A 07/14/2014   Procedure: NASAL ENDOSCOPY WITH EPISTAXIS CONTROL;  Surgeon: Ruby Cola, MD;  Location: [facility];  Service: ENT;  Laterality: N/A;   NASAL ENDOSCOPY WITH EPISTAXIS CONTROL Bilateral 07/15/2014   Procedure: NASAL ENDOSCOPY WITH EPISTAXIS CONTROL;  Surgeon: Ruby Cola, MD;  Location: [facility];  Service: ENT;  Laterality: Bilateral;   PACEMAKER INSERTION  2010   implanted by Dr Doreatha Lew (MDT)   [place] N/A 08/16/2019   Procedure: PPM GENERATOR CHANGEOUT;  Surgeon: Thompson Grayer, MD;  Location: [facility];  Service: Cardiovascular;  Laterality: N/A;   RADIOLOGY WITH ANESTHESIA N/A 07/14/2014   Procedure: RADIOLOGY WITH ANESTHESIA;  Surgeon: Medication Radiologist, MD;  Location: [facility];  Service: Radiology;  Laterality: N/A;   TEE WITHOUT CARDIOVERSION N/A 07/01/2015   Procedure: TRANSESOPHAGEAL ECHOCARDIOGRAM (TEE);  Surgeon: Sueanne Margarita, MD;  Location: [facility];  Service: Cardiovascular;  Laterality: N/A;    Current Medications: Current Meds  Medication Sig   acetaminophen (TYLENOL) 500 MG tablet Take 500-1,000 mg by mouth every 6 (six) hours as needed for moderate pain or headache.   Artificial Tear Ointment (DRY EYES OP) Place 1 drop into both eyes daily as needed (Dry eye).   Ascorbic Acid (VITAMIN C) 1000 MG tablet Take 1,000 mg by mouth daily.     calcium carbonate (OSCAL) 1500 (600 Ca) MG TABS tablet Take 1 tablet by mouth daily.   cholecalciferol (VITAMIN D) 1000 UNITS tablet Take 1,000 Units by mouth daily.     Coenzyme Q10 (CO Q-10 PO) Take 1 tablet by mouth daily.   COVID-19 mRNA vaccine, Moderna, 100 MCG/0.5ML injection Inject into the muscle.   diltiazem (CARDIZEM CD) 360 MG 24 hr capsule Take 1 capsule (360 mg total) by mouth daily.   diltiazem (CARDIZEM) 30 MG tablet Take 1/2 to 1 tablet every 4 hours AS NEEDED for heart rate >100 as long as top blood pressure >100.   famotidine (PEPCID) 20 MG tablet Take 20 mg by mouth  daily.   flecainide (TAMBOCOR) 50 MG tablet Take 1 tablet by mouth twice daily   influenza vaccine adjuvanted (FLUAD QUADRIVALENT) 0.5 ML injection Inject into the muscle.   levothyroxine (SYNTHROID) 50 MCG tablet Take 1 tablet in the morning on an empty stomach by mouth once a week for 90 days   levothyroxine (SYNTHROID) 75 MCG tablet TAKE 75 MCG BY MOUTH FOR 6 DAYS A WEEK (TAKE 50 MCG THE REMAINING 1 DAY OF THE WEEK). Oral 90 days   methylcellulose (CITRUCEL) oral powder Take 2 packets by mouth daily.   metoprolol succinate (TOPROL XL) 50 MG 24 hr tablet Take 1.5 tablets by mouth 2 (two) times daily.   Multiple Vitamins-Minerals (ICAPS AREDS 2 PO) Take 1 capsule by mouth daily.    Omega-3 Fatty Acids (FISH OIL)  1200 MG CAPS Take 1,200 mg by mouth daily. Mega Red.   polyethylene glycol powder (GLYCOLAX/MIRALAX) powder Take 0.5 Containers by mouth at bedtime.    pravastatin (PRAVACHOL) 10 MG tablet Take 1 tablet by mouth once daily   Probiotic Product (ALIGN PO) Take 1 capsule by mouth daily.   Rivaroxaban (XARELTO) 15 MG TABS tablet Take 1 tablet (15 mg total) by mouth daily with supper.   sodium chloride (OCEAN) 0.65 % SOLN nasal spray Place 1 spray into both nostrils daily.   temazepam (RESTORIL) 30 MG capsule TAKE 1 CAPSULE BY MOUTH IN THE EVENING Orally Once a day 90 days   Zinc 50 MG CAPS Take 50 mg by mouth daily.    [DISCONTINUED] furosemide (LASIX) 20 MG tablet Take 20 mg by mouth daily.     Allergies:   Trazodone and nefazodone   Social History   Socioeconomic History   Marital status: Widowed    Spouse name: Not on file   Number of children: 3   Years of education: Not on file   Highest education level: Not on file  Occupational History   Occupation: retired  Tobacco Use   Smoking status: Former    Types: Cigarettes    Quit date: 02/09/1980    Years since quitting: 41.7   Smokeless tobacco: Never  Vaping Use   Vaping Use: Never used  Substance and Sexual Activity    Alcohol use: Yes    Alcohol/week: 7.0 standard drinks of alcohol    Types: 7 Glasses of wine per week   Drug use: No   Sexual activity: Not Currently  Other Topics Concern   Not on file  Social History Narrative   Not on file   Social Determinants of Health   Financial Resource Strain: Not on file  Food Insecurity: Not on file  Transportation Needs: Not on file  Physical Activity: Not on file  Stress: Not on file  Social Connections: Not on file     Family History: The patient's family history includes CAD in her brother; Congestive Heart Failure in her mother; Dementia in her mother; Diabetes in her paternal grandfather; Heart attack (age of onset: 48) in her father; Hypertension in her mother; Leukemia in her daughter; Lung cancer in her son. There is no history of Colon cancer, Kidney disease, Liver disease, Esophageal cancer, Stroke, Rectal cancer, or Stomach cancer.  ROS:   Please see the history of present illness.    (+) Epistaxis (+) Hoarseness (+) Bilateral LE edema All other systems reviewed and are negative.  EKGs/Labs/Other Studies Reviewed:    The following studies were reviewed today:  Lexiscan Myoview 09/26/2020: There was no ST segment deviation noted during stress. The study is normal. Negative for ischemia. This is a low risk study. This study was not gated.   Echo 04/11/2020:  1. Left ventricular ejection fraction, by estimation, is 60 to 65%. The  left ventricle has normal function. The left ventricle has no regional  wall motion abnormalities. There is mild left ventricular hypertrophy.  Left ventricular diastolic parameters are consistent with Grade III diastolic dysfunction (restrictive). Elevated left atrial pressure.   2. Right ventricular systolic function is normal. The right ventricular  size is normal. There is mildly elevated pulmonary artery systolic  pressure. The estimated right ventricular systolic pressure is 22.2 mmHg.   3. Left atrial  size was moderately dilated.   4. Right atrial size was mildly dilated.   5. The mitral valve is normal in  structure. Mild to moderate mitral valve  regurgitation.   6. Tricuspid valve regurgitation is mild to moderate.   7. The aortic valve is tricuspid. Aortic valve regurgitation is moderate.  Mild aortic valve sclerosis is present, with no evidence of aortic valve  stenosis.   8. The inferior vena cava is normal in size with greater than 50%  respiratory variability, suggesting right atrial pressure of 3 mmHg.    Renal Artery Dopplers 01/22/2019: IMPRESSION: 1. Normal renal ultrasound.  Negative for hydronephrosis. 2. Normal renal artery duplex examination. No evidence for renal artery stenosis.   Echo 06/05/15: Study Conclusions   - Left ventricle: The cavity size was normal. Wall thickness was   normal. Systolic function was normal. The estimated ejection   fraction was in the range of 55% to 60%. Wall motion was normal;   there were no regional wall motion abnormalities. - Aortic valve: Trileaflet; mildly thickened, mildly calcified   leaflets. There was mild regurgitation. - Mitral valve: Calcified annulus. - Left atrium: The atrium was mildly dilated. - Right ventricle: Pacer wire or catheter noted in right ventricle.   Impressions:   - Compared to the prior study, there has been no significant   interval change.   EKG:   EKG is personally reviewed. 10/14/2021: EKG was not ordered. 03/26/2021: Atrial fibrillation, a-paced and v-paced. Rate 62 bpm. 09/22/2020: Atrial fibrillation and a paced V pace.  Rate 70 bpm.  Inferolateral T wave inversions. 09/05/2019: Sinus rhythm.  Rate 91 bpm.  First-degree AV block.  Nonspecific T wave abnormalities. 01/25/19: APVS.  Long AV delay.  Rate 61 bpm.   11/18/17: AP VS.  Prolonged AV delay.  Non-specific t wave abnormalities.  12/07/16: AP VS.  Rate 65 bpm.  Non-specific t wave abnormalities.     Recent Labs: 03/26/2021: BNP  310.2 07/29/2021: BUN 6; Creatinine, Ser 0.77; Hemoglobin 12.3; Platelets 218; Potassium 4.1; Sodium 135   Recent Lipid Panel    Component Value Date/Time   CHOL 167 10/08/2015 0848   TRIG 56 10/08/2015 0848   HDL 94 10/08/2015 0848   CHOLHDL 1.8 10/08/2015 0848   VLDL 11 10/08/2015 0848   LDLCALC 62 10/08/2015 0848     Risk Assessment/Calculations:    CHA2DS2-VASc Score = 6   This indicates a 9.7% annual risk of stroke. The patient's score is based upon: CHF History: 1 HTN History: 1 Diabetes History: 0 Stroke History: 0 Vascular Disease History: 1 Age Score: 2 Gender Score: 1          Physical Exam:    Wt Readings from Last 3 Encounters:  10/14/21 125 lb 3.2 oz (56.8 kg)  05/29/21 124 lb (56.2 kg)  03/26/21 126 lb 6.4 oz (57.3 kg)     VS:  BP 120/72 (BP Location: Right Arm, Patient Position: Sitting, Cuff Size: Normal)   Pulse 63   Ht 5' (1.524 m)   Wt 125 lb 3.2 oz (56.8 kg)   SpO2 94%   BMI 24.45 kg/m  , BMI Body mass index is 24.45 kg/m. GENERAL:  Well appearing HEENT: Pupils equal round and reactive, fundi not visualized, oral mucosa unremarkable NECK:  No jugular venous distention, waveform within normal limits, carotid upstroke brisk and symmetric, no bruits, no thyromegaly LUNGS:  Clear to auscultation bilaterally HEART:  RRR.  PMI not displaced or sustained,S1 and S2 within normal limits, no S3, no S4, no clicks, no rubs, no murmurs ABD:  Flat, positive bowel sounds normal in frequency in pitch, no  bruits, no rebound, no guarding, no midline pulsatile mass, no hepatomegaly, no splenomegaly EXT:  2 plus pulses throughout, trace bilateral LE edema to the ankles, no cyanosis no clubbing SKIN:  No rashes no nodules NEURO:  Cranial nerves II through XII grossly intact, motor grossly intact throughout PSYCH:  Cognitively intact, oriented to person place and time   ASSESSMENT:    1. Chronic atrial fibrillation ([place])   2. CAD in native artery   3.  Epistaxis, recurrent   4. Diastolic dysfunction    PLAN:    Chronic atrial fibrillation She is doing well from an atrial fibrillation standpoint and can remember her last episode.  Continue diltiazem and metoprolol.  Given that she is having significant epistaxis ongoing for hours, okay to hold her Xarelto now.  She can continue to hold it for 2 days after her nosebleed stops.  If she continues to have epistaxis she needs to call her ENT.  CAD in native artery She has nonobstructive disease and has no ischemic symptoms.  Continue metoprolol and pravastatin.  Lipids have been well controlled.  Increase exercise as tolerated.  Epistaxis, recurrent She has another episode of epistaxis.  Her nose is currently packed and recommended that she hold her Xarelto as above.  Continue using Afrin and hold pressure on the bridge of her nose.  Follow-up with ENT.  Diastolic dysfunction Today she is euvolemic but she has been struggling with volume overload.  She was previously on 40 mg of Lasix every day and felt better.  However she had mild renal dysfunction.  We will have her take 40 mg on Monday Wednesday Friday and continue with 20 mg on the other day.  Blood pressures well controlled on current regimen.        Disposition: FU with Kathryn Mirabal C. Oval Linsey, MD, [facility] in 6 months.  Medication Adjustments/Labs and Tests Ordered: Current medicines are reviewed at length with the patient today.  Concerns regarding medicines are outlined above.   Orders Placed This Encounter  Procedures   Basic metabolic panel   Meds ordered this encounter  Medications   furosemide (LASIX) 20 MG tablet    Sig: Take 2 tablets (40 mg total) by mouth every Monday AND 1 tablet (20 mg total) every Tuesday AND 2 tablets (40 mg total) every Wednesday AND 1 tablet (20 mg total) every Thursday AND 2 tablets (40 mg total) every Friday AND 1 tablet (20 mg total) every Saturday AND 1 tablet (20 mg total) every Sunday.     Dispense:  120 tablet    Refill:  3   Patient Instructions  Medication Instructions:  Your physician has recommended you make the following change in your medication:   Change: Lasix '40mg'$  Monday, Wednesday and Friday. Take '20mg'$  all other days.   Please hold Xarelto until nosebleed stop plus two days then resume normal dosing  *If you need a refill on your cardiac medications before your next appointment, please call your pharmacy*   Lab Work: Please return for Lab work in 2 weeks for BMP. You may come to the...   [facility] (3rd floor) [address] [place], [place] [address]  Open: 8am-Noon and 1pm-4:30pm  Please ring the doorbell on the small table when you exit the elevator and the Lab Tech will come get you  [place] at [facility] [address] [place], [place], [place] [address] Open: 8am-1pm, then 2pm-4:30pm   [place]- Please see attached locations sheet stapled to  your lab work with address and hours.   If you have labs (blood work) drawn today and your tests are completely normal, you will receive your results only by: [place] (if you have MyChart) OR A paper copy in the mail If you have any lab test that is abnormal or we need to change your treatment, we will call you to review the results.  Follow-Up: At [facility], you and your health needs are our priority.  As part of our continuing mission to provide you with exceptional heart care, we have created designated Provider Care Teams.  These Care Teams include your primary Cardiologist (physician) and Advanced Practice Providers (APPs -  Physician Assistants and Nurse Practitioners) who all work together to provide you with the care you need, when you need it.  We recommend signing up for the patient portal called "MyChart".  Sign up information is provided on this After Visit Summary.  MyChart is used to connect with patients for Virtual Visits  (Telemedicine).  Patients are able to view lab/test results, encounter notes, upcoming appointments, etc.  Non-urgent messages can be sent to your provider as well.   To learn more about what you can do with MyChart, go to [email or web address].    Your next appointment:   6 month(s)  The format for your next appointment:   In Person  Provider:   [name], MD      [facility],acting as a scribe for [name], MD.,have documented all relevant documentation on the behalf of [name], MD,as directed by  [name], MD while in the presence of [name], MD.  I, Buford Oval Linsey, MD have reviewed all documentation for this visit.  The documentation of the exam, diagnosis, procedures, and orders on 10/14/2021 are all accurate and complete.   Signed, [name], MD  10/14/2021 5:11 PM    [facility]

## 2021-10-14 ENCOUNTER — Encounter (HOSPITAL_BASED_OUTPATIENT_CLINIC_OR_DEPARTMENT_OTHER): Payer: Self-pay | Admitting: Cardiovascular Disease

## 2021-10-14 ENCOUNTER — Ambulatory Visit (HOSPITAL_BASED_OUTPATIENT_CLINIC_OR_DEPARTMENT_OTHER): Payer: Medicare Other | Admitting: Cardiovascular Disease

## 2021-10-14 ENCOUNTER — Other Ambulatory Visit (HOSPITAL_BASED_OUTPATIENT_CLINIC_OR_DEPARTMENT_OTHER): Payer: Self-pay

## 2021-10-14 DIAGNOSIS — I5189 Other ill-defined heart diseases: Secondary | ICD-10-CM | POA: Diagnosis not present

## 2021-10-14 DIAGNOSIS — I2510 Atherosclerotic heart disease of native coronary artery without angina pectoris: Secondary | ICD-10-CM | POA: Diagnosis not present

## 2021-10-14 DIAGNOSIS — R040 Epistaxis: Secondary | ICD-10-CM

## 2021-10-14 DIAGNOSIS — I482 Chronic atrial fibrillation, unspecified: Secondary | ICD-10-CM

## 2021-10-14 MED ORDER — FUROSEMIDE 20 MG PO TABS
ORAL_TABLET | ORAL | 3 refills | Status: DC
  Filled 2021-10-14 – 2021-10-15 (×3): qty 120, 85d supply, fill #0

## 2021-10-14 NOTE — Assessment & Plan Note (Signed)
She has nonobstructive disease and has no ischemic symptoms.  Continue metoprolol and pravastatin.  Lipids have been well controlled.  Increase exercise as tolerated.

## 2021-10-14 NOTE — Patient Instructions (Signed)
Medication Instructions:  Your physician has recommended you make the following change in your medication:   Change: Lasix '40mg'$  Monday, Wednesday and Friday. Take '20mg'$  all other days.   Please hold Xarelto until nosebleed stop plus two days then resume normal dosing  *If you need a refill on your cardiac medications before your next appointment, please call your pharmacy*   Lab Work: Please return for Lab work in 2 weeks for BMP. You may come to the...   [facility] (3rd floor) [address], [place], [place] [address]  Open: 8am-Noon and 1pm-4:30pm  Please ring the doorbell on the small table when you exit the elevator and the Lab Tech will come get you  [place] at [facility] [address] [place], [place], [place] [address] Open: 8am-1pm, then 2pm-4:30pm   [place]- Please see attached locations sheet stapled to your lab work with address and hours.   If you have labs (blood work) drawn today and your tests are completely normal, you will receive your results only by: [place] (if you have MyChart) OR A paper copy in the mail If you have any lab test that is abnormal or we need to change your treatment, we will call you to review the results.  Follow-Up: At [facility], you and your health needs are our priority.  As part of our continuing mission to provide you with exceptional heart care, we have created designated Provider Care Teams.  These Care Teams include your primary Cardiologist (physician) and Advanced Practice Providers (APPs -  Physician Assistants and Nurse Practitioners) who all work together to provide you with the care you need, when you need it.  We recommend signing up for the patient portal called "MyChart".  Sign up information is provided on this After Visit Summary.  MyChart is used to connect with patients for Virtual Visits (Telemedicine).  Patients are able to view lab/test results, encounter  notes, upcoming appointments, etc.  Non-urgent messages can be sent to your provider as well.   To learn more about what you can do with MyChart, go to [email or web address].    Your next appointment:   6 month(s)  The format for your next appointment:   In Person  Provider:   [name], MD

## 2021-10-14 NOTE — Assessment & Plan Note (Signed)
She is doing well from an atrial fibrillation standpoint and can remember her last episode.  Continue diltiazem and metoprolol.  Given that she is having significant epistaxis ongoing for hours, okay to hold her Xarelto now.  She can continue to hold it for 2 days after her nosebleed stops.  If she continues to have epistaxis she needs to call her ENT.

## 2021-10-14 NOTE — Assessment & Plan Note (Signed)
Today she is euvolemic but she has been struggling with volume overload.  She was previously on 40 mg of Lasix every day and felt better.  However she had mild renal dysfunction.  We will have her take 40 mg on Monday Wednesday Friday and continue with 20 mg on the other day.  Blood pressures well controlled on current regimen.

## 2021-10-14 NOTE — Assessment & Plan Note (Signed)
She has another episode of epistaxis.  Her nose is currently packed and recommended that she hold her Xarelto as above.  Continue using Afrin and hold pressure on the bridge of her nose.  Follow-up with ENT.

## 2021-10-15 ENCOUNTER — Other Ambulatory Visit (HOSPITAL_BASED_OUTPATIENT_CLINIC_OR_DEPARTMENT_OTHER): Payer: Self-pay

## 2021-10-27 ENCOUNTER — Ambulatory Visit: Payer: Self-pay

## 2021-10-27 NOTE — Patient Outreach (Signed)
  Care Coordination   Initial Visit Note   10/27/2021 Name: Kathryn Gibson MRN: [id number] DOB: 11/08/1933  Kathryn Gibson is a 88 y.o. year old female who sees Avva, Ravisankar, MD for primary care. I spoke with  Kathryn Gibson by phone today.  What matters to the patients health and wellness today?  No concerns. Doing well at this time.    Goals Addressed             This Visit's Progress    COMPLETED: Care Coordination Activites       Care Coordination Interventions: SDoH screening performed - no acute resource challenges identified at this time Discussed the patient does not have concerns with medication costs and/or adherence Performed chart review to note patient participated in an annual wellness visit 05/18/21 Education provided on the role of the care coordination team - no follow up desired at this time Instructed the patient to contact her primary care provider as needed        SDOH assessments and interventions completed:  Yes  SDOH Interventions Today    Flowsheet Row Most Recent Value  SDOH Interventions   Food Insecurity Interventions Intervention Not Indicated  Housing Interventions Intervention Not Indicated  Transportation Interventions Intervention Not Indicated  Utilities Interventions Intervention Not Indicated  Financial Strain Interventions Intervention Not Indicated        Care Coordination Interventions Activated:  Yes  Care Coordination Interventions:  Yes, provided   Follow up plan: No further intervention required.   Encounter Outcome:  Pt. Visit Completed   Kathryn Gibson, BSW, CDP Social Worker, Certified Dementia Practitioner [facility]  Care Coordination [phone]

## 2021-10-27 NOTE — Patient Instructions (Signed)
Visit Information  Thank you for taking time to visit with me today. Please don't hesitate to contact me if I can be of assistance to you.   Following are the goals we discussed today:   Goals Addressed             This Visit's Progress    COMPLETED: Care Coordination Activites       Care Coordination Interventions: [facility] screening performed - no acute resource challenges identified at this time Discussed the patient does not have concerns with medication costs and/or adherence Performed chart review to note patient participated in an annual wellness visit 05/18/21 Education provided on the role of the care coordination team - no follow up desired at this time Instructed the patient to contact her primary care provider as needed        Please call the care guide team at [phone] if you need to schedule an appointment with our care coordination team.  If you are experiencing a Mental Health or [place] or need someone to talk to, please call 1-800-273-TALK (toll free, 24 hour hotline)  Patient verbalizes understanding of instructions and care plan provided today and agrees to view in [place]. Active MyChart status and patient understanding of how to access instructions and care plan via MyChart confirmed with patient.     No further follow up required: Please contact your primary care provider as needed.  [name], BSW, CDP Social Worker, Certified Dementia Practitioner [facility]  Care Coordination [phone]

## 2021-10-29 ENCOUNTER — Encounter (HOSPITAL_BASED_OUTPATIENT_CLINIC_OR_DEPARTMENT_OTHER): Payer: Self-pay | Admitting: Cardiovascular Disease

## 2021-10-29 ENCOUNTER — Other Ambulatory Visit (HOSPITAL_BASED_OUTPATIENT_CLINIC_OR_DEPARTMENT_OTHER): Payer: Self-pay

## 2021-10-29 DIAGNOSIS — I5189 Other ill-defined heart diseases: Secondary | ICD-10-CM | POA: Diagnosis not present

## 2021-10-29 DIAGNOSIS — R040 Epistaxis: Secondary | ICD-10-CM | POA: Diagnosis not present

## 2021-10-29 DIAGNOSIS — Z5181 Encounter for therapeutic drug level monitoring: Secondary | ICD-10-CM

## 2021-10-29 DIAGNOSIS — I482 Chronic atrial fibrillation, unspecified: Secondary | ICD-10-CM | POA: Diagnosis not present

## 2021-10-29 DIAGNOSIS — I2510 Atherosclerotic heart disease of native coronary artery without angina pectoris: Secondary | ICD-10-CM | POA: Diagnosis not present

## 2021-10-29 DIAGNOSIS — R0602 Shortness of breath: Secondary | ICD-10-CM

## 2021-10-29 NOTE — Telephone Encounter (Signed)
FYI

## 2021-10-30 DIAGNOSIS — I5032 Chronic diastolic (congestive) heart failure: Secondary | ICD-10-CM | POA: Diagnosis not present

## 2021-10-30 DIAGNOSIS — R0602 Shortness of breath: Secondary | ICD-10-CM | POA: Diagnosis not present

## 2021-10-30 DIAGNOSIS — I480 Paroxysmal atrial fibrillation: Secondary | ICD-10-CM | POA: Diagnosis not present

## 2021-10-30 DIAGNOSIS — I2510 Atherosclerotic heart disease of native coronary artery without angina pectoris: Secondary | ICD-10-CM | POA: Diagnosis not present

## 2021-10-30 LAB — BASIC METABOLIC PANEL
BUN/Creatinine Ratio: 24 (ref 12–28)
BUN: 20 mg/dL (ref 8–27)
CO2: 22 mmol/L (ref 20–29)
Calcium: 9.3 mg/dL (ref 8.7–10.3)
Chloride: 95 mmol/L — ABNORMAL LOW (ref 96–106)
Creatinine, Ser: 0.84 mg/dL (ref 0.57–1.00)
Glucose: 86 mg/dL (ref 70–99)
Potassium: 4.1 mmol/L (ref 3.5–5.2)
Sodium: 135 mmol/L (ref 134–144)
eGFR: 68 mL/min/{1.73_m2} (ref >59)

## 2021-11-05 ENCOUNTER — Other Ambulatory Visit (HOSPITAL_BASED_OUTPATIENT_CLINIC_OR_DEPARTMENT_OTHER): Payer: Self-pay

## 2021-11-05 MED ORDER — TORSEMIDE 20 MG PO TABS
40.0000 mg | ORAL_TABLET | Freq: Every day | ORAL | 1 refills | Status: DC
  Filled 2021-11-05: qty 180, 90d supply, fill #0
  Filled 2022-02-03: qty 180, 90d supply, fill #1

## 2021-11-05 NOTE — Telephone Encounter (Signed)
Pt. Following up with you  

## 2021-11-05 NOTE — Telephone Encounter (Signed)
Called and discussed "stiff heart" with patient  She will be going out of town next week returning 10/8, will have labs week of 10/9

## 2021-11-06 ENCOUNTER — Encounter (HOSPITAL_BASED_OUTPATIENT_CLINIC_OR_DEPARTMENT_OTHER): Payer: Self-pay

## 2021-11-09 ENCOUNTER — Other Ambulatory Visit (HOSPITAL_BASED_OUTPATIENT_CLINIC_OR_DEPARTMENT_OTHER): Payer: Self-pay

## 2021-11-13 ENCOUNTER — Ambulatory Visit (INDEPENDENT_AMBULATORY_CARE_PROVIDER_SITE_OTHER): Payer: Medicare Other

## 2021-11-13 DIAGNOSIS — I495 Sick sinus syndrome: Secondary | ICD-10-CM

## 2021-11-15 LAB — CUP PACEART REMOTE DEVICE CHECK
Battery Remaining Longevity: 113 mo
Battery Voltage: 3.01 V
Brady Statistic AP VP Percent: 85.5 %
Brady Statistic AP VS Percent: 0.23 %
Brady Statistic AS VP Percent: 0.93 %
Brady Statistic AS VS Percent: 13.34 %
Brady Statistic RA Percent Paced: 86.11 %
Brady Statistic RV Percent Paced: 86.41 %
Date Time Interrogation Session: 20231008193119
Implantable Lead Connection Status: 753985
Implantable Lead Connection Status: 753985
Implantable Lead Implant Date: 20101109
Implantable Lead Implant Date: 20101109
Implantable Lead Location: 753859
Implantable Lead Location: 753860
Implantable Lead Model: 4469
Implantable Lead Model: 4470
Implantable Lead Serial Number: 535369
Implantable Lead Serial Number: 657967
Implantable Pulse Generator Implant Date: 20210708
Lead Channel Impedance Value: 247 Ohm
Lead Channel Impedance Value: 361 Ohm
Lead Channel Impedance Value: 380 Ohm
Lead Channel Impedance Value: 399 Ohm
Lead Channel Pacing Threshold Amplitude: 0.625 V
Lead Channel Pacing Threshold Amplitude: 1 V
Lead Channel Pacing Threshold Pulse Width: 0.4 ms
Lead Channel Pacing Threshold Pulse Width: 0.4 ms
Lead Channel Sensing Intrinsic Amplitude: 0.125 mV
Lead Channel Sensing Intrinsic Amplitude: 0.125 mV
Lead Channel Sensing Intrinsic Amplitude: 4.5 mV
Lead Channel Sensing Intrinsic Amplitude: 4.5 mV
Lead Channel Setting Pacing Amplitude: 2 V
Lead Channel Setting Pacing Amplitude: 2.5 V
Lead Channel Setting Pacing Pulse Width: 0.4 ms
Lead Channel Setting Sensing Sensitivity: 1.2 mV
Zone Setting Status: 755011
Zone Setting Status: 755011

## 2021-11-16 ENCOUNTER — Encounter (HOSPITAL_BASED_OUTPATIENT_CLINIC_OR_DEPARTMENT_OTHER): Payer: Self-pay | Admitting: Cardiovascular Disease

## 2021-11-16 DIAGNOSIS — R0602 Shortness of breath: Secondary | ICD-10-CM | POA: Diagnosis not present

## 2021-11-16 DIAGNOSIS — Z23 Encounter for immunization: Secondary | ICD-10-CM | POA: Diagnosis not present

## 2021-11-16 DIAGNOSIS — I5032 Chronic diastolic (congestive) heart failure: Secondary | ICD-10-CM | POA: Diagnosis not present

## 2021-11-16 DIAGNOSIS — I480 Paroxysmal atrial fibrillation: Secondary | ICD-10-CM | POA: Diagnosis not present

## 2021-11-16 DIAGNOSIS — I2510 Atherosclerotic heart disease of native coronary artery without angina pectoris: Secondary | ICD-10-CM | POA: Diagnosis not present

## 2021-11-17 DIAGNOSIS — Z5181 Encounter for therapeutic drug level monitoring: Secondary | ICD-10-CM | POA: Diagnosis not present

## 2021-11-17 DIAGNOSIS — R0602 Shortness of breath: Secondary | ICD-10-CM | POA: Diagnosis not present

## 2021-11-17 NOTE — Progress Notes (Signed)
Remote pacemaker transmission.   

## 2021-11-18 ENCOUNTER — Other Ambulatory Visit (HOSPITAL_BASED_OUTPATIENT_CLINIC_OR_DEPARTMENT_OTHER): Payer: Self-pay

## 2021-11-18 ENCOUNTER — Other Ambulatory Visit: Payer: Self-pay | Admitting: Internal Medicine

## 2021-11-18 ENCOUNTER — Other Ambulatory Visit (HOSPITAL_BASED_OUTPATIENT_CLINIC_OR_DEPARTMENT_OTHER): Payer: Self-pay | Admitting: Cardiovascular Disease

## 2021-11-18 DIAGNOSIS — I480 Paroxysmal atrial fibrillation: Secondary | ICD-10-CM

## 2021-11-18 DIAGNOSIS — N281 Cyst of kidney, acquired: Secondary | ICD-10-CM

## 2021-11-18 LAB — BASIC METABOLIC PANEL
BUN/Creatinine Ratio: 29 — ABNORMAL HIGH (ref 12–28)
BUN: 33 mg/dL — ABNORMAL HIGH (ref 8–27)
CO2: 26 mmol/L (ref 20–29)
Calcium: 9.1 mg/dL (ref 8.7–10.3)
Chloride: 96 mmol/L (ref 96–106)
Creatinine, Ser: 1.12 mg/dL — ABNORMAL HIGH (ref 0.57–1.00)
Glucose: 108 mg/dL — ABNORMAL HIGH (ref 70–99)
Potassium: 4.2 mmol/L (ref 3.5–5.2)
Sodium: 139 mmol/L (ref 134–144)
eGFR: 48 mL/min/{1.73_m2} — ABNORMAL LOW (ref >59)

## 2021-11-18 LAB — BRAIN NATRIURETIC PEPTIDE: BNP: 224.2 pg/mL — ABNORMAL HIGH (ref 0.0–100.0)

## 2021-11-18 MED ORDER — TEMAZEPAM 30 MG PO CAPS
30.0000 mg | ORAL_CAPSULE | Freq: Every evening | ORAL | 0 refills | Status: DC
  Filled 2021-11-18: qty 30, 30d supply, fill #0

## 2021-11-18 MED ORDER — DILTIAZEM HCL ER COATED BEADS 360 MG PO CP24
360.0000 mg | ORAL_CAPSULE | Freq: Every day | ORAL | 2 refills | Status: DC
  Filled 2021-11-18: qty 90, 90d supply, fill #0
  Filled 2022-02-19: qty 90, 90d supply, fill #1
  Filled 2022-05-17: qty 90, 90d supply, fill #2

## 2021-11-18 NOTE — Telephone Encounter (Signed)
Rx(s) sent to pharmacy electronically.  

## 2021-12-01 ENCOUNTER — Encounter (HOSPITAL_BASED_OUTPATIENT_CLINIC_OR_DEPARTMENT_OTHER): Payer: Self-pay | Admitting: Cardiovascular Disease

## 2021-12-01 ENCOUNTER — Other Ambulatory Visit (HOSPITAL_BASED_OUTPATIENT_CLINIC_OR_DEPARTMENT_OTHER): Payer: Self-pay

## 2021-12-01 ENCOUNTER — Other Ambulatory Visit: Payer: Self-pay | Admitting: Physician Assistant

## 2021-12-01 DIAGNOSIS — I480 Paroxysmal atrial fibrillation: Secondary | ICD-10-CM

## 2021-12-02 ENCOUNTER — Other Ambulatory Visit (HOSPITAL_BASED_OUTPATIENT_CLINIC_OR_DEPARTMENT_OTHER): Payer: Self-pay

## 2021-12-02 MED ORDER — METOPROLOL SUCCINATE ER 50 MG PO TB24
75.0000 mg | ORAL_TABLET | Freq: Two times a day (BID) | ORAL | 3 refills | Status: DC
  Filled 2021-12-02: qty 270, 90d supply, fill #0
  Filled 2022-03-01: qty 270, 90d supply, fill #1
  Filled 2022-05-31: qty 270, 90d supply, fill #2
  Filled 2022-08-25: qty 270, 90d supply, fill #3

## 2021-12-04 ENCOUNTER — Other Ambulatory Visit (HOSPITAL_BASED_OUTPATIENT_CLINIC_OR_DEPARTMENT_OTHER): Payer: Self-pay

## 2021-12-22 ENCOUNTER — Other Ambulatory Visit (HOSPITAL_BASED_OUTPATIENT_CLINIC_OR_DEPARTMENT_OTHER): Payer: Self-pay

## 2021-12-23 ENCOUNTER — Other Ambulatory Visit (HOSPITAL_BASED_OUTPATIENT_CLINIC_OR_DEPARTMENT_OTHER): Payer: Self-pay

## 2021-12-24 ENCOUNTER — Other Ambulatory Visit (HOSPITAL_BASED_OUTPATIENT_CLINIC_OR_DEPARTMENT_OTHER): Payer: Self-pay

## 2021-12-24 MED ORDER — TEMAZEPAM 30 MG PO CAPS
30.0000 mg | ORAL_CAPSULE | Freq: Every evening | ORAL | 1 refills | Status: DC
  Filled 2021-12-24: qty 90, 90d supply, fill #0
  Filled 2022-03-23: qty 90, 90d supply, fill #1

## 2021-12-25 ENCOUNTER — Encounter: Payer: Self-pay | Admitting: Internal Medicine

## 2021-12-28 ENCOUNTER — Ambulatory Visit
Admission: RE | Admit: 2021-12-28 | Discharge: 2021-12-28 | Disposition: A | Discharge status: Home / Self Care | Payer: Medicare Other | Source: Ambulatory Visit | Attending: Internal Medicine | Admitting: Internal Medicine

## 2021-12-28 DIAGNOSIS — N281 Cyst of kidney, acquired: Secondary | ICD-10-CM | POA: Diagnosis not present

## 2021-12-28 DIAGNOSIS — K862 Cyst of pancreas: Secondary | ICD-10-CM | POA: Diagnosis not present

## 2021-12-28 DIAGNOSIS — S22080A Wedge compression fracture of T11-T12 vertebra, initial encounter for closed fracture: Secondary | ICD-10-CM | POA: Diagnosis not present

## 2021-12-28 DIAGNOSIS — M4316 Spondylolisthesis, lumbar region: Secondary | ICD-10-CM | POA: Diagnosis not present

## 2021-12-28 MED ORDER — IOPAMIDOL (ISOVUE-300) INJECTION 61%
100.0000 mL | Freq: Once | INTRAVENOUS | Status: AC | PRN
  Administered 2021-12-28: 100 mL via INTRAVENOUS

## 2021-12-29 ENCOUNTER — Encounter (HOSPITAL_BASED_OUTPATIENT_CLINIC_OR_DEPARTMENT_OTHER): Payer: Self-pay

## 2021-12-29 DIAGNOSIS — L570 Actinic keratosis: Secondary | ICD-10-CM | POA: Diagnosis not present

## 2021-12-29 DIAGNOSIS — Z85828 Personal history of other malignant neoplasm of skin: Secondary | ICD-10-CM | POA: Diagnosis not present

## 2021-12-29 DIAGNOSIS — L821 Other seborrheic keratosis: Secondary | ICD-10-CM | POA: Diagnosis not present

## 2021-12-29 DIAGNOSIS — D1801 Hemangioma of skin and subcutaneous tissue: Secondary | ICD-10-CM | POA: Diagnosis not present

## 2022-01-04 ENCOUNTER — Other Ambulatory Visit: Payer: Self-pay | Admitting: Cardiovascular Disease

## 2022-01-04 ENCOUNTER — Other Ambulatory Visit (HOSPITAL_BASED_OUTPATIENT_CLINIC_OR_DEPARTMENT_OTHER): Payer: Self-pay

## 2022-01-04 MED ORDER — RIVAROXABAN 15 MG PO TABS
15.0000 mg | ORAL_TABLET | Freq: Every day | ORAL | 2 refills | Status: DC
  Filled 2022-01-04: qty 30, 30d supply, fill #0
  Filled 2022-02-03 – 2022-02-04 (×2): qty 5, 5d supply, fill #1
  Filled 2022-02-04: qty 30, 30d supply, fill #1
  Filled 2022-02-10: qty 85, 85d supply, fill #2
  Filled 2022-05-01: qty 90, 90d supply, fill #3
  Filled 2022-07-28: qty 60, 60d supply, fill #4

## 2022-01-04 NOTE — Telephone Encounter (Signed)
Per last ov note, pt to hold until nosebleeds stop. Please advise if ok to fill. Thank you!!

## 2022-01-04 NOTE — Telephone Encounter (Signed)
Prescription refill request for Xarelto received.  Indication:afib Last office visit:9/23 Weight:56.8  kg Age:88 Scr:1.1 CrCl:32.92  ml/min  Prescription refilled

## 2022-01-05 ENCOUNTER — Other Ambulatory Visit: Payer: Self-pay

## 2022-01-05 ENCOUNTER — Ambulatory Visit: Payer: Medicare Other | Attending: Cardiovascular Disease | Admitting: Cardiovascular Disease

## 2022-01-05 ENCOUNTER — Encounter: Payer: Self-pay | Admitting: Cardiovascular Disease

## 2022-01-05 ENCOUNTER — Other Ambulatory Visit (HOSPITAL_BASED_OUTPATIENT_CLINIC_OR_DEPARTMENT_OTHER): Payer: Self-pay

## 2022-01-05 VITALS — BP 122/62 | HR 67 | Ht 60.0 in | Wt 125.0 lb

## 2022-01-05 DIAGNOSIS — I482 Chronic atrial fibrillation, unspecified: Secondary | ICD-10-CM

## 2022-01-05 NOTE — Patient Instructions (Signed)
Medication Instructions:  Your physician recommends that you continue on your current medications as directed. Please refer to the Current Medication list given to you today.  *If you need a refill on your cardiac medications before your next appointment, please call your pharmacy*   Lab Work: BMET If you have labs (blood work) drawn today and your tests are completely normal, you will receive your results only by: [place] (if you have MyChart) OR A paper copy in the mail If you have any lab test that is abnormal or we need to change your treatment, we will call you to review the results.  Follow-Up: At [facility], you and your health needs are our priority.  As part of our continuing mission to provide you with exceptional heart care, we have created designated Provider Care Teams.  These Care Teams include your primary Cardiologist (physician) and Advanced Practice Providers (APPs -  Physician Assistants and Nurse Practitioners) who all work together to provide you with the care you need, when you need it.  Your next appointment:   6 month(s)  The format for your next appointment:   In Person  Provider:   You will see one of the following Advanced Practice Providers on your designated Care Team:   Tommye Standard, [place] Legrand Como "Jonni Sanger" Chalmers Cater, [place]   Important Information About [place]

## 2022-01-05 NOTE — Progress Notes (Signed)
Cardiology Office Note Date:  01/05/2022  Patient ID:  Kathryn, Gibson 1933-12-16, MRN [id number] PCP:  Prince Solian, MD  Cardiologist:  Dr. Oval Linsey Electrophysiologist: Dr. Rayann Heman    Chief Complaint:  6 mo  History of Present Illness: Kathryn Gibson is a 88 y.o. female with history of AFib, bradycardia w/PPM, HTN, hypothyroidism. She returns today for routine EP follow-up.  Since her last visit with [place] about 6 months ago, she saw Dr. Oval Linsey. At that time, she was having episodes of epistaxis.  Device information  MDT dual chamber PPM implanted 12/17/2008, gen change 08/16/2019  AFib hx 07/01/2015: PVI ablation, including Mitral isthmus and Cavo-tricuspid isthmus ablation noting Multiple atypical atrial flutter circuits observed too unstable for mapping  Has had an ATach AAD  Amiodarone goes back at least to 2012 >>> STOPPED 2/2 abn LFTs Flecainide started Jan 2022  Past Medical History:  Diagnosis Date   Arthritis    Atrial fibrillation ([place])    Atypical chest pain 09/22/2020   Blood transfusion without reported diagnosis    2017   Bradycardia    s/p PPM   CAD in native artery 03/26/2021   Cataract    Chronic diastolic heart failure ([place]) 03/26/2021   Colonic polyp    adenomatous   Constipation    Diverticulosis    Hemorrhoids    Hyperlipidemia    Hypertension    Hypothyroidism    Irritable bowel syndrome (IBS)    Lower extremity edema 09/22/2020   Osteoporosis    osteopenia   Pacemaker 2010   UTI (urinary tract infection)     Past Surgical History:  Procedure Laterality Date   BREAST CYST ASPIRATION Left 1980s   COLONOSCOPY     ELECTROPHYSIOLOGIC STUDY N/A 07/01/2015   Procedure: Atrial Fibrillation Ablation;  Surgeon: Thompson Grayer, MD;  Location: [facility];  Service: Cardiovascular;  Laterality: N/A;   EYE SURGERY     heart ablation     for atrial fib   HEMATOMA EVACUATION Right 07/15/2014   Procedure: EVACUATION Right Groin Hematoma;   Surgeon: Rosetta Posner, MD;  Location: [facility];  Service: Vascular;  Laterality: Right;   NASAL ENDOSCOPY WITH EPISTAXIS CONTROL N/A 07/14/2014   Procedure: NASAL ENDOSCOPY WITH EPISTAXIS CONTROL;  Surgeon: Ruby Cola, MD;  Location: [facility];  Service: ENT;  Laterality: N/A;   NASAL ENDOSCOPY WITH EPISTAXIS CONTROL Bilateral 07/15/2014   Procedure: NASAL ENDOSCOPY WITH EPISTAXIS CONTROL;  Surgeon: Ruby Cola, MD;  Location: [facility];  Service: ENT;  Laterality: Bilateral;   PACEMAKER INSERTION  2010   implanted by Dr Doreatha Lew (MDT)   [place] N/A 08/16/2019   Procedure: PPM GENERATOR CHANGEOUT;  Surgeon: Thompson Grayer, MD;  Location: [facility];  Service: Cardiovascular;  Laterality: N/A;   RADIOLOGY WITH ANESTHESIA N/A 07/14/2014   Procedure: RADIOLOGY WITH ANESTHESIA;  Surgeon: Medication Radiologist, MD;  Location: [facility];  Service: Radiology;  Laterality: N/A;   TEE WITHOUT CARDIOVERSION N/A 07/01/2015   Procedure: TRANSESOPHAGEAL ECHOCARDIOGRAM (TEE);  Surgeon: Sueanne Margarita, MD;  Location: [facility];  Service: Cardiovascular;  Laterality: N/A;    Current Outpatient Medications  Medication Sig Dispense Refill   acetaminophen (TYLENOL) 500 MG tablet Take 500-1,000 mg by mouth every 6 (six) hours as needed for moderate pain or headache.     Artificial Tear Ointment (DRY EYES OP) Place 1 drop into both eyes daily as needed (Dry eye).     Ascorbic Acid (VITAMIN C)  1000 MG tablet Take 1,000 mg by mouth daily.       calcium carbonate (OSCAL) 1500 (600 Ca) MG TABS tablet Take 1 tablet by mouth daily.     cholecalciferol (VITAMIN D) 1000 UNITS tablet Take 1,000 Units by mouth daily.       Coenzyme Q10 (CO Q-10 PO) Take 1 tablet by mouth daily.     diltiazem (CARDIZEM CD) 360 MG 24 hr capsule Take 1 capsule (360 mg total) by mouth daily. 90 capsule 2   diltiazem (CARDIZEM) 30 MG tablet Take 1/2 to 1 tablet every 4 hours AS NEEDED for heart rate >100 as long as top blood pressure  >100. 45 tablet 3   famotidine (PEPCID) 20 MG tablet Take 20 mg by mouth daily.     flecainide (TAMBOCOR) 50 MG tablet Take 1 tablet by mouth twice daily 60 tablet 11   levothyroxine (SYNTHROID) 50 MCG tablet Take 1 tablet in the morning on an empty stomach by mouth once a week for 90 days 36 tablet 3   levothyroxine (SYNTHROID) 75 MCG tablet TAKE 75 MCG BY MOUTH FOR 6 DAYS A WEEK (TAKE 50 MCG THE REMAINING 1 DAY OF THE WEEK). Oral 90 days 78 tablet 3   methylcellulose (CITRUCEL) oral powder Take 2 packets by mouth daily.     metoprolol succinate (TOPROL XL) 50 MG 24 hr tablet Take 1.5 tablets (75 mg total) by mouth 2 (two) times daily. 270 tablet 3   Multiple Vitamins-Minerals (ICAPS AREDS 2 PO) Take 1 capsule by mouth daily.      Omega-3 Fatty Acids (FISH OIL) 1200 MG CAPS Take 1,200 mg by mouth daily. Mega Red.     polyethylene glycol powder (GLYCOLAX/MIRALAX) powder Take 0.5 Containers by mouth at bedtime.   0   pravastatin (PRAVACHOL) 10 MG tablet Take 1 tablet by mouth once daily 90 tablet 3   Probiotic Product (ALIGN PO) Take 1 capsule by mouth daily.     Rivaroxaban (XARELTO) 15 MG TABS tablet Take 1 tablet (15 mg total) by mouth daily with supper. 90 tablet 2   sodium chloride (OCEAN) 0.65 % SOLN nasal spray Place 1 spray into both nostrils daily.     temazepam (RESTORIL) 30 MG capsule Take 1 capsule (30 mg total) by mouth every evening. 90 capsule 1   torsemide (DEMADEX) 20 MG tablet Take 2 tablets (40 mg total) by mouth daily. 180 tablet 1   Zinc 50 MG CAPS Take 50 mg by mouth daily.      No current facility-administered medications for this visit.    Allergies:   Trazodone and nefazodone   Social History:  The patient  reports that she quit smoking about 41 years ago. Her smoking use included cigarettes. She has never used smokeless tobacco. She reports current alcohol use of about 7.0 standard drinks of alcohol per week. She reports that she does not use drugs.   Family History:   The patient's family history includes CAD in her brother; Congestive Heart Failure in her mother; Dementia in her mother; Diabetes in her paternal grandfather; Heart attack (age of onset: 45) in her father; Hypertension in her mother; Leukemia in her daughter; Lung cancer in her son.  ROS:  Please see the history of present illness.    All other systems are reviewed and otherwise negative.   PHYSICAL EXAM:  VS:  BP 122/62   Pulse 67   Ht 5' (1.524 m)   Wt 125 lb (56.7 kg)  SpO2 96%   BMI 24.41 kg/m  BMI: Body mass index is 24.41 kg/m. Well nourished, well developed, in no acute distress  HEENT: normocephalic, atraumatic  Neck: no JVD, carotid bruits or masses Cardiac:   RRR; no significant murmurs, no rubs, or gallops Lungs:  CTA b/l, no wheezing, rhonchi or rales  Abd: soft, nontender MS: no deformity or atrophy Ext:  trace is any edema noted b/l Skin: warm and dry, no rash Neuro:  No gross deficits appreciated Psych: euthymic mood, full affect  PPM site (R side) is stable, no tethering or discomfort   EKG:  Reviewed by me - AV paced rhythm   PPM interrogation done today and reviewed by myself;  See Paceart   09/26/2020: stress myoview There was no ST segment deviation noted during stress. The study is normal. Negative for ischemia. This is a low risk study. This study was not gated.   07/01/2015; EPA/Ablation, Dr. Rayann Heman CONCLUSIONS: 1. Atrial paced rhythm upon presentation.   2. Successful electrical isolation and anatomical encircling of all four pulmonary veins with radiofrequency current and a WACA approach.  Additional left atrial ablation was performed to create a standard box lesion along the posterior wall of the left atrium. 3. Multiple atypical atrial flutter circuits observed too unstable for mapping today.  Mitral isthmus and Cavo-tricuspid isthmus ablation performed today 4. Successful cardioversion to sinus rhythm 5. No early apparent  complications   03/13/7626: TTE IMPRESSIONS   1. Left ventricular ejection fraction, by estimation, is 60 to 65%. The  left ventricle has normal function. The left ventricle has no regional  wall motion abnormalities. There is mild left ventricular hypertrophy.  Left ventricular diastolic parameters  are consistent with Grade III diastolic dysfunction (restrictive).  Elevated left atrial pressure.   2. Right ventricular systolic function is normal. The right ventricular  size is normal. There is mildly elevated pulmonary artery systolic  pressure. The estimated right ventricular systolic pressure is 31.5 mmHg.   3. Left atrial size was moderately dilated.   4. Right atrial size was mildly dilated.   5. The mitral valve is normal in structure. Mild to moderate mitral valve  regurgitation.   6. Tricuspid valve regurgitation is mild to moderate.   7. The aortic valve is tricuspid. Aortic valve regurgitation is moderate.  Mild aortic valve sclerosis is present, with no evidence of aortic valve  stenosis.   8. The inferior vena cava is normal in size with greater than 50%  respiratory variability, suggesting right atrial pressure of 3 mmHg.      Recent Labs: 07/29/2021: Hemoglobin 12.3; Platelets 218 11/17/2021: BNP 224.2; BUN 33; Creatinine, Ser 1.12; Potassium 4.2; Sodium 139  No results found for requested labs within last 365 days.   CrCl cannot be calculated (Patient's most recent lab result is older than the maximum 21 days allowed.).   Wt Readings from Last 3 Encounters:  01/05/22 125 lb (56.7 kg)  10/14/21 125 lb 3.2 oz (56.8 kg)  05/29/21 124 lb (56.2 kg)     Other studies reviewed: Additional studies/records reviewed today include: summarized above  ASSESSMENT AND PLAN:  1. PPM     Intact function     No programming changes made  2. Paroxysmal AFib     Has had an Atach as well     CHA2DS2vasc is 4, on xarelto appropriately dosed     On flecainide, dilt      0.2  % burden  She remains happy with her  arrhythmia burden, does not want to make any changes No symptoms of angina Will recheck renal function in a few weeks. If Cr is climbing, we will need to switch from flecainide.  3. HTN     Looks good         Signed, Melida Quitter, MD  01/05/2022 1:52 PM     [place] [address] [phone] (office)  [phone] (fax)

## 2022-01-06 ENCOUNTER — Other Ambulatory Visit (HOSPITAL_BASED_OUTPATIENT_CLINIC_OR_DEPARTMENT_OTHER): Payer: Self-pay

## 2022-01-06 DIAGNOSIS — H524 Presbyopia: Secondary | ICD-10-CM | POA: Diagnosis not present

## 2022-01-06 MED ORDER — CROMOLYN SODIUM 4 % OP SOLN
OPHTHALMIC | 11 refills | Status: DC
  Filled 2022-01-06: qty 10, 30d supply, fill #0
  Filled 2022-03-23: qty 10, 30d supply, fill #1
  Filled 2022-08-10 (×2): qty 10, 30d supply, fill #2
  Filled 2022-11-11: qty 10, 30d supply, fill #3

## 2022-01-07 ENCOUNTER — Other Ambulatory Visit (HOSPITAL_BASED_OUTPATIENT_CLINIC_OR_DEPARTMENT_OTHER): Payer: Self-pay

## 2022-01-07 LAB — CUP PACEART INCLINIC DEVICE CHECK
Date Time Interrogation Session: 20231128121325
Implantable Lead Connection Status: 753985
Implantable Lead Connection Status: 753985
Implantable Lead Implant Date: 20101109
Implantable Lead Implant Date: 20101109
Implantable Lead Location: 753859
Implantable Lead Location: 753860
Implantable Lead Model: 4469
Implantable Lead Model: 4470
Implantable Lead Serial Number: 535369
Implantable Lead Serial Number: 657967
Implantable Pulse Generator Implant Date: 20210708

## 2022-01-08 ENCOUNTER — Other Ambulatory Visit (HOSPITAL_BASED_OUTPATIENT_CLINIC_OR_DEPARTMENT_OTHER): Payer: Self-pay

## 2022-01-08 DIAGNOSIS — R3914 Feeling of incomplete bladder emptying: Secondary | ICD-10-CM | POA: Diagnosis not present

## 2022-01-08 DIAGNOSIS — N281 Cyst of kidney, acquired: Secondary | ICD-10-CM | POA: Diagnosis not present

## 2022-01-11 ENCOUNTER — Other Ambulatory Visit (HOSPITAL_BASED_OUTPATIENT_CLINIC_OR_DEPARTMENT_OTHER): Payer: Self-pay

## 2022-01-12 ENCOUNTER — Other Ambulatory Visit (HOSPITAL_BASED_OUTPATIENT_CLINIC_OR_DEPARTMENT_OTHER): Payer: Self-pay

## 2022-01-12 DIAGNOSIS — I482 Chronic atrial fibrillation, unspecified: Secondary | ICD-10-CM | POA: Diagnosis not present

## 2022-01-13 LAB — BASIC METABOLIC PANEL
BUN/Creatinine Ratio: 32 — ABNORMAL HIGH (ref 12–28)
BUN: 30 mg/dL — ABNORMAL HIGH (ref 8–27)
CO2: 26 mmol/L (ref 20–29)
Calcium: 9.1 mg/dL (ref 8.7–10.3)
Chloride: 97 mmol/L (ref 96–106)
Creatinine, Ser: 0.93 mg/dL (ref 0.57–1.00)
Glucose: 112 mg/dL — ABNORMAL HIGH (ref 70–99)
Potassium: 3.6 mmol/L (ref 3.5–5.2)
Sodium: 139 mmol/L (ref 134–144)
eGFR: 60 mL/min/{1.73_m2} (ref >59)

## 2022-01-17 ENCOUNTER — Encounter: Payer: Self-pay | Admitting: Cardiovascular Disease

## 2022-01-19 DIAGNOSIS — M25552 Pain in left hip: Secondary | ICD-10-CM | POA: Diagnosis not present

## 2022-01-19 DIAGNOSIS — M5451 Vertebrogenic low back pain: Secondary | ICD-10-CM | POA: Diagnosis not present

## 2022-01-26 ENCOUNTER — Other Ambulatory Visit (HOSPITAL_BASED_OUTPATIENT_CLINIC_OR_DEPARTMENT_OTHER): Payer: Self-pay | Admitting: Urology

## 2022-01-26 DIAGNOSIS — D4101 Neoplasm of uncertain behavior of right kidney: Secondary | ICD-10-CM

## 2022-02-03 ENCOUNTER — Other Ambulatory Visit (HOSPITAL_BASED_OUTPATIENT_CLINIC_OR_DEPARTMENT_OTHER): Payer: Self-pay

## 2022-02-04 ENCOUNTER — Encounter (HOSPITAL_BASED_OUTPATIENT_CLINIC_OR_DEPARTMENT_OTHER): Payer: Self-pay

## 2022-02-04 ENCOUNTER — Other Ambulatory Visit (HOSPITAL_COMMUNITY): Payer: Self-pay

## 2022-02-04 ENCOUNTER — Other Ambulatory Visit (HOSPITAL_BASED_OUTPATIENT_CLINIC_OR_DEPARTMENT_OTHER): Payer: Self-pay

## 2022-02-04 ENCOUNTER — Other Ambulatory Visit: Payer: Self-pay

## 2022-02-09 DIAGNOSIS — M5451 Vertebrogenic low back pain: Secondary | ICD-10-CM | POA: Diagnosis not present

## 2022-02-10 ENCOUNTER — Other Ambulatory Visit (HOSPITAL_BASED_OUTPATIENT_CLINIC_OR_DEPARTMENT_OTHER): Payer: Self-pay

## 2022-02-12 ENCOUNTER — Ambulatory Visit (INDEPENDENT_AMBULATORY_CARE_PROVIDER_SITE_OTHER): Payer: Medicare Other

## 2022-02-12 DIAGNOSIS — I495 Sick sinus syndrome: Secondary | ICD-10-CM

## 2022-02-12 LAB — CUP PACEART REMOTE DEVICE CHECK
Battery Remaining Longevity: 109 mo
Battery Voltage: 3 V
Brady Statistic AP VP Percent: 97.87 %
Brady Statistic AP VS Percent: 0.05 %
Brady Statistic AS VP Percent: 0.2 %
Brady Statistic AS VS Percent: 1.88 %
Brady Statistic RA Percent Paced: 97.95 %
Brady Statistic RV Percent Paced: 98.05 %
Date Time Interrogation Session: 20240104203609
Implantable Lead Connection Status: 753985
Implantable Lead Connection Status: 753985
Implantable Lead Implant Date: 20101109
Implantable Lead Implant Date: 20101109
Implantable Lead Location: 753859
Implantable Lead Location: 753860
Implantable Lead Model: 4469
Implantable Lead Model: 4470
Implantable Lead Serial Number: 535369
Implantable Lead Serial Number: 657967
Implantable Pulse Generator Implant Date: 20210708
Lead Channel Impedance Value: 247 Ohm
Lead Channel Impedance Value: 380 Ohm
Lead Channel Impedance Value: 399 Ohm
Lead Channel Impedance Value: 399 Ohm
Lead Channel Pacing Threshold Amplitude: 0.625 V
Lead Channel Pacing Threshold Amplitude: 1 V
Lead Channel Pacing Threshold Pulse Width: 0.4 ms
Lead Channel Pacing Threshold Pulse Width: 0.4 ms
Lead Channel Sensing Intrinsic Amplitude: 0.125 mV
Lead Channel Sensing Intrinsic Amplitude: 0.125 mV
Lead Channel Sensing Intrinsic Amplitude: 3.75 mV
Lead Channel Sensing Intrinsic Amplitude: 3.75 mV
Lead Channel Setting Pacing Amplitude: 2 V
Lead Channel Setting Pacing Amplitude: 2.5 V
Lead Channel Setting Pacing Pulse Width: 0.4 ms
Lead Channel Setting Sensing Sensitivity: 1.2 mV
Zone Setting Status: 755011
Zone Setting Status: 755011

## 2022-02-13 ENCOUNTER — Encounter (HOSPITAL_BASED_OUTPATIENT_CLINIC_OR_DEPARTMENT_OTHER): Payer: Self-pay

## 2022-02-13 ENCOUNTER — Ambulatory Visit (HOSPITAL_BASED_OUTPATIENT_CLINIC_OR_DEPARTMENT_OTHER): Payer: BLUE CROSS/BLUE SHIELD

## 2022-02-15 ENCOUNTER — Other Ambulatory Visit (HOSPITAL_BASED_OUTPATIENT_CLINIC_OR_DEPARTMENT_OTHER): Payer: Self-pay

## 2022-02-15 MED ORDER — NITROFURANTOIN MONOHYD MACRO 100 MG PO CAPS
100.0000 mg | ORAL_CAPSULE | Freq: Two times a day (BID) | ORAL | 0 refills | Status: DC
  Filled 2022-02-15: qty 14, 7d supply, fill #0

## 2022-02-16 ENCOUNTER — Other Ambulatory Visit (HOSPITAL_BASED_OUTPATIENT_CLINIC_OR_DEPARTMENT_OTHER): Payer: Self-pay

## 2022-02-17 DIAGNOSIS — M5451 Vertebrogenic low back pain: Secondary | ICD-10-CM | POA: Diagnosis not present

## 2022-02-18 DIAGNOSIS — L821 Other seborrheic keratosis: Secondary | ICD-10-CM | POA: Diagnosis not present

## 2022-02-18 DIAGNOSIS — L309 Dermatitis, unspecified: Secondary | ICD-10-CM | POA: Diagnosis not present

## 2022-02-18 DIAGNOSIS — D692 Other nonthrombocytopenic purpura: Secondary | ICD-10-CM | POA: Diagnosis not present

## 2022-02-19 ENCOUNTER — Other Ambulatory Visit (HOSPITAL_BASED_OUTPATIENT_CLINIC_OR_DEPARTMENT_OTHER): Payer: Self-pay

## 2022-02-24 ENCOUNTER — Telehealth: Payer: Self-pay | Admitting: Physician Assistant

## 2022-02-24 NOTE — Telephone Encounter (Signed)
Lm on home vm for patient to return call.  

## 2022-02-24 NOTE — Telephone Encounter (Signed)
Received a MyChart message from patient requesting an appointment with Anderson Malta.  Patient's message states she is having difficulty with her food not going down and also feeling like she has a blockage.  She says she has not changed anything so she is not sure why she is having problems.  She would like to be seen as soon as possible.  Please call patient and advise.  Thank you.

## 2022-02-25 DIAGNOSIS — M5451 Vertebrogenic low back pain: Secondary | ICD-10-CM | POA: Diagnosis not present

## 2022-02-25 NOTE — Telephone Encounter (Signed)
Attempted to reach patient on her home phone and cell phone. Cell phone vm is full at this time. Lm on home vm for patient to return call.

## 2022-02-26 NOTE — Telephone Encounter (Signed)
Returned call to patient. She states that she tried to call back several times but the wait times were too long. Pt states that she is doing fine now. She does not need an appt at this time. She is tolerating solid foods OK and not having any difficulty. I did apologize that we were not able to speak with her sooner. Pt had no other concerns at the end of the call.

## 2022-02-26 NOTE — Telephone Encounter (Signed)
Patient returned call

## 2022-03-02 NOTE — Progress Notes (Signed)
Remote pacemaker transmission.   

## 2022-03-04 ENCOUNTER — Ambulatory Visit
Admission: RE | Admit: 2022-03-04 | Discharge: 2022-03-04 | Disposition: A | Discharge status: Home / Self Care | Payer: Medicare Other | Source: Ambulatory Visit | Attending: Urology | Admitting: Urology

## 2022-03-04 DIAGNOSIS — D4101 Neoplasm of uncertain behavior of right kidney: Secondary | ICD-10-CM

## 2022-03-04 DIAGNOSIS — M4319 Spondylolisthesis, multiple sites in spine: Secondary | ICD-10-CM | POA: Diagnosis not present

## 2022-03-04 DIAGNOSIS — M5451 Vertebrogenic low back pain: Secondary | ICD-10-CM | POA: Diagnosis not present

## 2022-03-04 DIAGNOSIS — N2889 Other specified disorders of kidney and ureter: Secondary | ICD-10-CM | POA: Diagnosis not present

## 2022-03-04 DIAGNOSIS — K862 Cyst of pancreas: Secondary | ICD-10-CM | POA: Diagnosis not present

## 2022-03-04 DIAGNOSIS — N281 Cyst of kidney, acquired: Secondary | ICD-10-CM | POA: Diagnosis not present

## 2022-03-04 MED ORDER — IOPAMIDOL (ISOVUE-370) INJECTION 76%
80.0000 mL | Freq: Once | INTRAVENOUS | Status: AC | PRN
  Administered 2022-03-04: 80 mL via INTRAVENOUS

## 2022-03-05 ENCOUNTER — Other Ambulatory Visit (HOSPITAL_BASED_OUTPATIENT_CLINIC_OR_DEPARTMENT_OTHER): Payer: Self-pay

## 2022-03-05 DIAGNOSIS — R059 Cough, unspecified: Secondary | ICD-10-CM | POA: Diagnosis not present

## 2022-03-05 DIAGNOSIS — I480 Paroxysmal atrial fibrillation: Secondary | ICD-10-CM | POA: Diagnosis not present

## 2022-03-05 DIAGNOSIS — R0600 Dyspnea, unspecified: Secondary | ICD-10-CM | POA: Diagnosis not present

## 2022-03-05 MED ORDER — CEFDINIR 300 MG PO CAPS
300.0000 mg | ORAL_CAPSULE | Freq: Two times a day (BID) | ORAL | 0 refills | Status: DC
  Filled 2022-03-05: qty 14, 7d supply, fill #0

## 2022-03-05 MED ORDER — AZITHROMYCIN 250 MG PO TABS
ORAL_TABLET | ORAL | 0 refills | Status: DC
Start: 2022-03-05 — End: 2022-03-05
  Filled 2022-03-05: qty 6, 5d supply, fill #0

## 2022-03-08 ENCOUNTER — Other Ambulatory Visit (HOSPITAL_BASED_OUTPATIENT_CLINIC_OR_DEPARTMENT_OTHER): Payer: Self-pay

## 2022-03-08 MED ORDER — DOXYCYCLINE HYCLATE 100 MG PO CAPS
100.0000 mg | ORAL_CAPSULE | Freq: Two times a day (BID) | ORAL | 0 refills | Status: DC
  Filled 2022-03-08: qty 14, 7d supply, fill #0

## 2022-03-09 ENCOUNTER — Other Ambulatory Visit (HOSPITAL_BASED_OUTPATIENT_CLINIC_OR_DEPARTMENT_OTHER): Payer: Self-pay

## 2022-03-09 MED ORDER — PREDNISONE 20 MG PO TABS
20.0000 mg | ORAL_TABLET | Freq: Two times a day (BID) | ORAL | 0 refills | Status: DC
Start: 2022-03-09 — End: 2022-04-14
  Filled 2022-03-09: qty 10, 5d supply, fill #0

## 2022-03-10 ENCOUNTER — Encounter (HOSPITAL_BASED_OUTPATIENT_CLINIC_OR_DEPARTMENT_OTHER): Payer: Self-pay | Admitting: Cardiovascular Disease

## 2022-03-10 NOTE — Telephone Encounter (Signed)
Called and spoke with patient  She has been sick with fever and cough Doing better now and fever subsided.  Has tested multiple times for Covid, all negative PCP did gave her Cefdinir x 7 days and Prednisone 20 mg twice a day for 5 days  but told her to let cardiologist know about virus given her history Advised patient continue current medications and will forward to [place] Mam NP for revie. Will call if any further recommendations

## 2022-03-11 ENCOUNTER — Other Ambulatory Visit (HOSPITAL_BASED_OUTPATIENT_CLINIC_OR_DEPARTMENT_OTHER): Payer: Self-pay

## 2022-03-11 MED ORDER — PROMETHAZINE-DM 6.25-15 MG/5ML PO SYRP
5.0000 mL | ORAL_SOLUTION | Freq: Four times a day (QID) | ORAL | 0 refills | Status: DC | PRN
Start: 2022-03-11 — End: 2022-04-14
  Filled 2022-03-11: qty 120, 6d supply, fill #0

## 2022-03-11 NOTE — Telephone Encounter (Signed)
Appreciate her letting us know. Hope she is feeling better. No changes needed at this time. Prednisone can cause some fluid retention- just have her let us know if she notes worsening swelling.   Loel Dubonnet, NP

## 2022-03-16 ENCOUNTER — Other Ambulatory Visit (HOSPITAL_BASED_OUTPATIENT_CLINIC_OR_DEPARTMENT_OTHER): Payer: Self-pay

## 2022-03-16 DIAGNOSIS — R059 Cough, unspecified: Secondary | ICD-10-CM | POA: Diagnosis not present

## 2022-03-16 DIAGNOSIS — J069 Acute upper respiratory infection, unspecified: Secondary | ICD-10-CM | POA: Diagnosis not present

## 2022-03-16 DIAGNOSIS — R0600 Dyspnea, unspecified: Secondary | ICD-10-CM | POA: Diagnosis not present

## 2022-03-16 MED ORDER — BENZONATATE 100 MG PO CAPS
100.0000 mg | ORAL_CAPSULE | Freq: Three times a day (TID) | ORAL | 0 refills | Status: AC
  Filled 2022-03-16: qty 30, 10d supply, fill #0

## 2022-03-16 MED ORDER — STERILE WATER FOR INJECTION IJ SOLN
5.0000 mL | Freq: Two times a day (BID) | OROMUCOSAL | 2 refills | Status: DC
  Filled 2022-03-16: qty 480, 48d supply, fill #0

## 2022-03-17 ENCOUNTER — Encounter (HOSPITAL_BASED_OUTPATIENT_CLINIC_OR_DEPARTMENT_OTHER): Payer: Self-pay | Admitting: Cardiovascular Disease

## 2022-03-17 NOTE — Telephone Encounter (Signed)
See other mychart message from today.

## 2022-03-17 NOTE — Telephone Encounter (Signed)
Spoke with patient regarding message. She stated she started having SBP readings less than 100 and having shortness of breath Was concerned about pneumonia so went to PCP yesterday. Chest xray was negative for pneumonia This am blood pressure 100/50. Discussed with Dr Oval Linsey and will have her hold Torsemide for 2 days and Diltiazem until blood pressure stays coming up. Call PCP if no improvement in breathing. Mychart message sent to patient

## 2022-03-18 ENCOUNTER — Encounter (HOSPITAL_COMMUNITY): Payer: Self-pay | Admitting: *Deleted

## 2022-03-23 ENCOUNTER — Other Ambulatory Visit (HOSPITAL_BASED_OUTPATIENT_CLINIC_OR_DEPARTMENT_OTHER): Payer: Self-pay

## 2022-03-25 ENCOUNTER — Other Ambulatory Visit (HOSPITAL_BASED_OUTPATIENT_CLINIC_OR_DEPARTMENT_OTHER): Payer: Self-pay

## 2022-03-25 MED ORDER — NYSTATIN 100000 UNIT/ML MT SUSP
5.0000 mL | OROMUCOSAL | 1 refills | Status: DC
  Filled 2022-03-25 (×2): qty 150, 8d supply, fill #0
  Filled 2022-04-05: qty 150, 8d supply, fill #1

## 2022-04-05 ENCOUNTER — Other Ambulatory Visit (HOSPITAL_BASED_OUTPATIENT_CLINIC_OR_DEPARTMENT_OTHER): Payer: Self-pay

## 2022-04-09 ENCOUNTER — Other Ambulatory Visit: Payer: Self-pay | Admitting: Internal Medicine

## 2022-04-09 DIAGNOSIS — Z1231 Encounter for screening mammogram for malignant neoplasm of breast: Secondary | ICD-10-CM

## 2022-04-12 NOTE — Progress Notes (Signed)
Cardiology Office Note:    Date:  04/14/2022   ID:  Kathryn Gibson, DOB 21-Jul-1933, MRN [id number]  PCP:  Prince Solian, MD   [facility] Providers Cardiologist:  [name], MD Electrophysiologist:  Melida Quitter, MD     Referring MD: Prince Solian, MD   No chief complaint on file.   History of Present Illness:    Kathryn Gibson is a 89 y.o. female with a hx of atrial fibrillation s/p ablation, SSS s/p PPM, asymptomatic coronary calcifications, and hypertension, who presents for follow-up.  Kathryn Gibson  was previously a patient of Dr. Mare Ferrari.  She underwent ablation for atrial fibrillation on 07/01/15.  She had an episode of atrial fibrillation 12/2015.  Amiodarone was previously increased due to atrial tachycardia.  This was discontinued due to elevated LFTs which subsequently normalized.  She saw Dr. Margaretann Loveless 07/2018 and reported exertional dyspnea.  Dr. Margaretann Loveless offered stress testing which she wanted to think about.  Her device was interrogated 01/08/19 and was nearing ERI.  There were no episodes of atrial fibrillation noted.   Her generator was changed out 08/2019.  Afterward she reported feeling poorly and felt like her heart was racing, though is in sinus rhythm in the 90s.  Metoprolol was increased.  She saw Dr. Rayann Heman on 05/2020 and was doing well.  Her A. fib burden was at 3.4%.  On her remote check 08/2020 it was 10.6%. She followed up with EP on 11/2020 and they did not make any changes. She had some chest tightness that was thought to be due to her thoracic spine fracture. She had a nuclear stress test 09/2020 that was normal.   She reported some LE edema. BNP was 310. Lasix was increased to 40 mg. She followed up with EP 05/2021 and was doing well with minimal Afib.   At her last visit she had recent epistaxis lasting 2 hours. She endorsed intermittent epistaxis ever since a fall around 2016 with subsequent nasal surgery. She was advised to hold her Xarelto up to 2  days after resolution of her nosebleed. Her home blood pressures were averaging [id number] systolic. On exam she was euvolemic but she had been struggling with volume overload.  She was previously on 40 mg of Lasix every day and felt better.  However she had mild renal dysfunction.  We changed her Lasix to 40 mg on Monday, Wednesday, Friday, and continued with 20 mg on the other days. At her follow-up 12/2021 with EP she was doing well. On 03/10/2022 she reported having a viral infection with fever and cough. Home COVID testing was negative. Her PCP prescribed Cefdinir x7 days and 20 mg Prednisone BID for 5 days. She continued to feel worse and reported DOE, hypotension to the [id number] systolic and as low as 50 diastolic. She was instructed to hold her torsemide for 2 days, and hold her diltiazem until her blood pressure normalized. She followed up with her PCP and had a chest x-ray that was negative for pneumonia.  Today, she reports that she also had a COVID infection shortly after her viral infection as above. Currently she complains of cough, shortness of breath, LE edema, and irregular bowel movements this past week. Her breathing is stable while sitting, but worse when exerting. This has been disrupting her exercise routines, especially with visiting the pool. She has already been feeling the effects of deconditioning. She also notes that her gait is off with left ankle pain; she needs to force herself  to slow down and make purposeful strides. In general she still can't walk very far. She does much better with exercising in the pool. Every day she is drinking 76 oz of water, and has had a little bit of Gatorade. She denies any recurring episodes of atrial fibrillation. If it does occur she will take the 1/2 tablet of diltiazem. She denies any palpitations, chest pain, lightheadedness, headaches, syncope, orthopnea, or PND.   Past Medical History:  Diagnosis Date   Arthritis    Atrial fibrillation ([place])     Atypical chest pain 09/22/2020   Blood transfusion without reported diagnosis    2017   Bradycardia    s/p PPM   CAD in native artery 03/26/2021   Cataract    Chronic diastolic heart failure ([place]) 03/26/2021   Colonic polyp    adenomatous   Constipation    Diverticulosis    Hemorrhoids    Hyperlipidemia    Hypertension    Hypothyroidism    Irritable bowel syndrome (IBS)    Lower extremity edema 09/22/2020   Osteoporosis    osteopenia   Pacemaker 2010   UTI (urinary tract infection)     Past Surgical History:  Procedure Laterality Date   BREAST CYST ASPIRATION Left 1980s   COLONOSCOPY     ELECTROPHYSIOLOGIC STUDY N/A 07/01/2015   Procedure: Atrial Fibrillation Ablation;  Surgeon: Thompson Grayer, MD;  Location: [place] [facility];  Service: Cardiovascular;  Laterality: N/A;   EYE SURGERY     heart ablation     for atrial fib   HEMATOMA EVACUATION Right 07/15/2014   Procedure: EVACUATION Right Groin Hematoma;  Surgeon: Rosetta Posner, MD;  Location: [place];  Service: Vascular;  Laterality: Right;   NASAL ENDOSCOPY WITH EPISTAXIS CONTROL N/A 07/14/2014   Procedure: NASAL ENDOSCOPY WITH EPISTAXIS CONTROL;  Surgeon: Ruby Cola, MD;  Location: [place];  Service: ENT;  Laterality: N/A;   NASAL ENDOSCOPY WITH EPISTAXIS CONTROL Bilateral 07/15/2014   Procedure: NASAL ENDOSCOPY WITH EPISTAXIS CONTROL;  Surgeon: Ruby Cola, MD;  Location: [place];  Service: ENT;  Laterality: Bilateral;   PACEMAKER INSERTION  2010   implanted by Dr Doreatha Lew (MDT)   [place] N/A 08/16/2019   Procedure: PPM GENERATOR CHANGEOUT;  Surgeon: Thompson Grayer, MD;  Location: [place] [facility];  Service: Cardiovascular;  Laterality: N/A;   RADIOLOGY WITH ANESTHESIA N/A 07/14/2014   Procedure: RADIOLOGY WITH ANESTHESIA;  Surgeon: Medication Radiologist, MD;  Location: [place];  Service: Radiology;  Laterality: N/A;   TEE WITHOUT CARDIOVERSION N/A 07/01/2015   Procedure: TRANSESOPHAGEAL ECHOCARDIOGRAM (TEE);  Surgeon:  Sueanne Margarita, MD;  Location: [facility];  Service: Cardiovascular;  Laterality: N/A;    Current Medications: Current Meds  Medication Sig   acetaminophen (TYLENOL) 500 MG tablet Take 500-1,000 mg by mouth every 6 (six) hours as needed for moderate pain or headache.   Artificial Tear Ointment (DRY EYES OP) Place 1 drop into both eyes daily as needed (Dry eye).   Ascorbic Acid (VITAMIN C) 1000 MG tablet Take 1,000 mg by mouth daily.     calcium carbonate (OSCAL) 1500 (600 Ca) MG TABS tablet Take 1 tablet by mouth daily.   cholecalciferol (VITAMIN D) 1000 UNITS tablet Take 1,000 Units by mouth daily.     Coenzyme Q10 (CO Q-10 PO) Take 1 tablet by mouth daily.   cromolyn (OPTICROM) 4 % ophthalmic solution Instill 1 drop into both eyes four times a day   diltiazem (CARDIZEM CD) 360  MG 24 hr capsule Take 1 capsule (360 mg total) by mouth daily.   diltiazem (CARDIZEM) 30 MG tablet Take 1/2 to 1 tablet every 4 hours AS NEEDED for heart rate >100 as long as top blood pressure >100.   famotidine (PEPCID) 20 MG tablet Take 20 mg by mouth daily.   flecainide (TAMBOCOR) 50 MG tablet Take 1 tablet by mouth twice daily   levothyroxine (SYNTHROID) 50 MCG tablet Take 1 tablet in the morning on an empty stomach by mouth once a week for 90 days   levothyroxine (SYNTHROID) 75 MCG tablet TAKE 75 MCG BY MOUTH FOR 6 DAYS A WEEK (TAKE 50 MCG THE REMAINING 1 DAY OF THE WEEK). Oral 90 days   methylcellulose (CITRUCEL) oral powder Take 2 packets by mouth daily.   metoprolol succinate (TOPROL XL) 50 MG 24 hr tablet Take 1.5 tablets (75 mg total) by mouth 2 (two) times daily.   Multiple Vitamins-Minerals (ICAPS AREDS 2 PO) Take 1 capsule by mouth daily.    nystatin (MYCOSTATIN) 100000 UNIT/ML suspension Swish 5 mL and swallow before every meal and at bedtime.   Omega-3 Fatty Acids (FISH OIL) 1200 MG CAPS Take 1,200 mg by mouth daily. Mega Red.   polyethylene glycol powder (GLYCOLAX/MIRALAX) powder Take 0.5 Containers  by mouth at bedtime.    pravastatin (PRAVACHOL) 10 MG tablet Take 1 tablet by mouth once daily   Probiotic Product (ALIGN PO) Take 1 capsule by mouth daily.   Rivaroxaban (XARELTO) 15 MG TABS tablet Take 1 tablet (15 mg total) by mouth daily with supper.   sodium chloride (OCEAN) 0.65 % SOLN nasal spray Place 1 spray into both nostrils daily.   temazepam (RESTORIL) 30 MG capsule Take 1 capsule (30 mg total) by mouth every evening.   torsemide (DEMADEX) 20 MG tablet Take 2 tablets (40 mg total) by mouth daily.   Zinc 50 MG CAPS Take 50 mg by mouth daily.      Allergies:   Trazodone and nefazodone   Social History   Socioeconomic History   Marital status: Widowed    Spouse name: Not on file   Number of children: 3   Years of education: Not on file   Highest education level: Not on file  Occupational History   Occupation: retired  Tobacco Use   Smoking status: Former    Types: Cigarettes    Quit date: 02/09/1980    Years since quitting: 42.2   Smokeless tobacco: Never  Vaping Use   Vaping Use: Never used  Substance and Sexual Activity   Alcohol use: Yes    Alcohol/week: 7.0 standard drinks of alcohol    Types: 7 Glasses of wine per week   Drug use: No   Sexual activity: Not Currently  Other Topics Concern   Not on file  Social History Narrative   Not on file   Social Determinants of Health   Financial Resource Strain: Low Risk  (10/27/2021)   Overall Financial Resource Strain (CARDIA)    Difficulty of Paying Living Expenses: Not hard at all  Food Insecurity: No Food Insecurity (10/27/2021)   Hunger Vital Sign    Worried About Running Out of Food in the Last Year: Never true    Ran Out of Food in the Last Year: Never true  Transportation Needs: No Transportation Needs (10/27/2021)   PRAPARE - Hydrologist (Medical): No    Lack of Transportation (Non-Medical): No  Physical Activity: Not on file  Stress: Not on file  Social Connections: Not on  file     Family History: The patient's family history includes CAD in her brother; Congestive Heart Failure in her mother; Dementia in her mother; Diabetes in her paternal grandfather; Heart attack (age of onset: 23) in her father; Hypertension in her mother; Leukemia in her daughter; Lung cancer in her son. There is no history of Colon cancer, Kidney disease, Liver disease, Esophageal cancer, Stroke, Rectal cancer, or Stomach cancer.  ROS:   Please see the history of present illness.    (+) Cough (+) Shortness of breath (+) LE edema (+) Left ankle pain (+) Gait instability (+) Irregular bowel movements All other systems reviewed and are negative.  EKGs/Labs/Other Studies Reviewed:    The following studies were reviewed today:  Lexiscan Myoview 09/26/2020: There was no ST segment deviation noted during stress. The study is normal. Negative for ischemia. This is a low risk study. This study was not gated.   Echo 04/11/2020:  1. Left ventricular ejection fraction, by estimation, is 60 to 65%. The  left ventricle has normal function. The left ventricle has no regional  wall motion abnormalities. There is mild left ventricular hypertrophy.  Left ventricular diastolic parameters are consistent with Grade III diastolic dysfunction (restrictive). Elevated left atrial pressure.   2. Right ventricular systolic function is normal. The right ventricular  size is normal. There is mildly elevated pulmonary artery systolic  pressure. The estimated right ventricular systolic pressure is [id number] mmHg.   3. Left atrial size was moderately dilated.   4. Right atrial size was mildly dilated.   5. The mitral valve is normal in structure. Mild to moderate mitral valve  regurgitation.   6. Tricuspid valve regurgitation is mild to moderate.   7. The aortic valve is tricuspid. Aortic valve regurgitation is moderate.  Mild aortic valve sclerosis is present, with no evidence of aortic valve  stenosis.   8.  The inferior vena cava is normal in size with greater than 50%  respiratory variability, suggesting right atrial pressure of 3 mmHg.    Renal Artery Dopplers 01/22/2019: IMPRESSION: 1. Normal renal ultrasound.  Negative for hydronephrosis. 2. Normal renal artery duplex examination. No evidence for renal artery stenosis.   Echo 06/05/15: Study Conclusions   - Left ventricle: The cavity size was normal. Wall thickness was   normal. Systolic function was normal. The estimated ejection   fraction was in the range of 55% to 60%. Wall motion was normal;   there were no regional wall motion abnormalities. - Aortic valve: Trileaflet; mildly thickened, mildly calcified   leaflets. There was mild regurgitation. - Mitral valve: Calcified annulus. - Left atrium: The atrium was mildly dilated. - Right ventricle: Pacer wire or catheter noted in right ventricle.   Impressions:   - Compared to the prior study, there has been no significant   interval change.   EKG:   EKG is personally reviewed. 04/14/2022:  AV-paced. Rate 64 bpm. 10/14/2021: EKG was not ordered. 03/26/2021: Atrial fibrillation, a-paced and v-paced. Rate 62 bpm. 09/22/2020: Atrial fibrillation and a paced V pace.  Rate 70 bpm.  Inferolateral T wave inversions. 09/05/2019: Sinus rhythm.  Rate 91 bpm.  First-degree AV block.  Nonspecific T wave abnormalities. 01/25/19: APVS.  Long AV delay.  Rate 61 bpm.   11/18/17: AP VS.  Prolonged AV delay.  Non-specific t wave abnormalities.  12/07/16: AP VS.  Rate 65 bpm.  Non-specific t wave abnormalities.     Recent  Labs: 07/29/2021: Hemoglobin 12.3; Platelets 218 11/17/2021: BNP 224.2 01/12/2022: BUN 30; Creatinine, Ser 0.93; Potassium 3.6; Sodium 139   Recent Lipid Panel    Component Value Date/Time   CHOL 167 10/08/2015 0848   TRIG 56 10/08/2015 0848   HDL 94 10/08/2015 0848   CHOLHDL 1.8 10/08/2015 0848   VLDL 11 10/08/2015 0848   LDLCALC 62 10/08/2015 0848     Risk  Assessment/Calculations:    CHA2DS2-VASc Score = 6   This indicates a 9.7% annual risk of stroke. The patient's score is based upon: CHF History: 1 HTN History: 1 Diabetes History: 0 Stroke History: 0 Vascular Disease History: 1 Age Score: 2 Gender Score: 1          Physical Exam:    Wt Readings from Last 3 Encounters:  04/14/22 121 lb (54.9 kg)  01/05/22 125 lb (56.7 kg)  10/14/21 125 lb 3.2 oz (56.8 kg)     VS:  BP 122/60   Pulse 64   Ht 5' (1.524 m)   Wt 121 lb (54.9 kg)   SpO2 95%   BMI 23.63 kg/m  , BMI Body mass index is 23.63 kg/m. GENERAL:  Well appearing HEENT: Pupils equal round and reactive, fundi not visualized, oral mucosa unremarkable NECK:  No jugular venous distention, waveform within normal limits, carotid upstroke brisk and symmetric, no bruits, no thyromegaly LUNGS:  Clear to auscultation bilaterally HEART:  RRR.  PMI not displaced or sustained,S1 and S2 within normal limits, no S3, no S4, no clicks, no rubs, no murmurs ABD:  Flat, positive bowel sounds normal in frequency in pitch, no bruits, no rebound, no guarding, no midline pulsatile mass, no hepatomegaly, no splenomegaly EXT:  2 plus pulses throughout, no LE edema, no cyanosis no clubbing SKIN:  No rashes no nodules NEURO:  Cranial nerves II through XII grossly intact, motor grossly intact throughout PSYCH:  Cognitively intact, oriented to person place and time   ASSESSMENT:    1. Essential hypertension   2. Cough, unspecified type   3. Poor balance   4. Chronic diastolic heart failure ([place])   5. CAD in native artery   6. Chronic atrial fibrillation ([facility])   7. Dyspnea on exertion     PLAN:    Essential hypertension Blood pressure was low during her viral infection and she had to hold her BP medication.  She is now back on her   Chronic diastolic heart failure ([place]) She is euvolemic and doing well.  BP is well-controlled on current regimen.   CAD in native artery Non-obstructive  CAD noted on imaging.  Continue metoprolol.  LDL 72.   Chronic atrial fibrillation She is atrially paced.  Continue diltiazem, metoprolol and flecainide.  Dyspnea Ms. Hamacher was recently diagnosed with COVID-19.  She notes significant dyspnea.  Oxygen saturation 95% today.  Will get a CXR to evaluate for pneumonia.  She is euvolemic with no evidence of heart failure on exam.   Disposition: FU with Jaanvi Fizer C. Oval Linsey, MD, [facility] in 6 months.  Medication Adjustments/Labs and Tests Ordered: Current medicines are reviewed at length with the patient today.  Concerns regarding medicines are outlined above.   Orders Placed This Encounter  Procedures   DG Chest 2 View   Ambulatory referral to Physical Therapy   EKG 12-Lead   No orders of the defined types were placed in this encounter.  Patient Instructions  Medication Instructions:  Your physician recommends that you continue on your current medications as directed.  Please refer to the Current Medication list given to you today.  *If you need a refill on your cardiac medications before your next appointment, please call your pharmacy*  Lab Work: NONE  Testing/Procedures: A chest x-ray takes a picture of the organs and structures inside the chest, including the heart, lungs, and blood vessels. This test can show several things, including, whether the heart is enlarges; whether fluid is building up in the lungs; and whether pacemaker / defibrillator leads are still in place.  Follow-Up: At [facility], you and your health needs are our priority.  As part of our continuing mission to provide you with exceptional heart care, we have created designated Provider Care Teams.  These Care Teams include your primary Cardiologist (physician) and Advanced Practice Providers (APPs -  Physician Assistants and Nurse Practitioners) who all work together to provide you with the care you need, when you need it.  We recommend signing up for the  patient portal called "MyChart".  Sign up information is provided on this After Visit Summary.  MyChart is used to connect with patients for Virtual Visits (Telemedicine).  Patients are able to view lab/test results, encounter notes, upcoming appointments, etc.  Non-urgent messages can be sent to your provider as well.   To learn more about what you can do with MyChart, go to [email or web address].    Your next appointment:   6 month(s)  Provider:   [name], MD   Other Instructions You have been referred PHYSICAL THERAPY    I,Mathew Stumpf,acting as a scribe for [name], MD.,have documented all relevant documentation on the behalf of [name], MD,as directed by  [name], MD while in the presence of [name], MD.  I, Petronila Oval Linsey, MD have reviewed all documentation for this visit.  The documentation of the exam, diagnosis, procedures, and orders on 04/14/2022 are all accurate and complete.  Signed, [name], MD  04/14/2022 11:09 AM    [place]

## 2022-04-14 ENCOUNTER — Ambulatory Visit (HOSPITAL_BASED_OUTPATIENT_CLINIC_OR_DEPARTMENT_OTHER): Payer: Medicare Other

## 2022-04-14 ENCOUNTER — Other Ambulatory Visit (HOSPITAL_BASED_OUTPATIENT_CLINIC_OR_DEPARTMENT_OTHER): Payer: Self-pay

## 2022-04-14 ENCOUNTER — Encounter (HOSPITAL_BASED_OUTPATIENT_CLINIC_OR_DEPARTMENT_OTHER): Payer: Self-pay | Admitting: Cardiovascular Disease

## 2022-04-14 ENCOUNTER — Ambulatory Visit (HOSPITAL_BASED_OUTPATIENT_CLINIC_OR_DEPARTMENT_OTHER): Payer: Medicare Other | Admitting: Cardiovascular Disease

## 2022-04-14 ENCOUNTER — Other Ambulatory Visit: Payer: Self-pay | Admitting: Internal Medicine

## 2022-04-14 VITALS — BP 122/60 | HR 64 | Ht 60.0 in | Wt 121.0 lb

## 2022-04-14 DIAGNOSIS — R2689 Other abnormalities of gait and mobility: Secondary | ICD-10-CM | POA: Diagnosis not present

## 2022-04-14 DIAGNOSIS — I5032 Chronic diastolic (congestive) heart failure: Secondary | ICD-10-CM | POA: Diagnosis not present

## 2022-04-14 DIAGNOSIS — R059 Cough, unspecified: Secondary | ICD-10-CM

## 2022-04-14 DIAGNOSIS — I10 Essential (primary) hypertension: Secondary | ICD-10-CM | POA: Diagnosis not present

## 2022-04-14 DIAGNOSIS — I482 Chronic atrial fibrillation, unspecified: Secondary | ICD-10-CM

## 2022-04-14 DIAGNOSIS — R0602 Shortness of breath: Secondary | ICD-10-CM | POA: Diagnosis not present

## 2022-04-14 DIAGNOSIS — I2510 Atherosclerotic heart disease of native coronary artery without angina pectoris: Secondary | ICD-10-CM

## 2022-04-14 DIAGNOSIS — R0609 Other forms of dyspnea: Secondary | ICD-10-CM

## 2022-04-14 DIAGNOSIS — J439 Emphysema, unspecified: Secondary | ICD-10-CM | POA: Diagnosis not present

## 2022-04-14 MED ORDER — PRAVASTATIN SODIUM 10 MG PO TABS
10.0000 mg | ORAL_TABLET | Freq: Every day | ORAL | 1 refills | Status: DC
  Filled 2022-04-14: qty 90, 90d supply, fill #0
  Filled 2022-07-06: qty 90, 90d supply, fill #1

## 2022-04-14 NOTE — Assessment & Plan Note (Signed)
Non-obstructive CAD noted on imaging.  Continue metoprolol.  LDL 72.

## 2022-04-14 NOTE — Telephone Encounter (Signed)
Rx request sent to pharmacy.  

## 2022-04-14 NOTE — Assessment & Plan Note (Signed)
Kathryn Gibson was recently diagnosed with COVID-19.  She notes significant dyspnea.  Oxygen saturation 95% today.  Will get a CXR to evaluate for pneumonia.  She is euvolemic with no evidence of heart failure on exam.

## 2022-04-14 NOTE — Assessment & Plan Note (Signed)
She is euvolemic and doing well.  BP is well-controlled on current regimen.

## 2022-04-14 NOTE — Assessment & Plan Note (Signed)
She is atrially paced.  Continue diltiazem, metoprolol and flecainide.

## 2022-04-14 NOTE — Assessment & Plan Note (Signed)
Blood pressure was low during her viral infection and she had to hold her BP medication.  She is now back on her

## 2022-04-14 NOTE — Telephone Encounter (Signed)
Pt's pharmacy is requesting a refill on pravastatin. Would Dr. Myles Gip like to refill this medication? Please address

## 2022-04-14 NOTE — Patient Instructions (Signed)
Medication Instructions:  Your physician recommends that you continue on your current medications as directed. Please refer to the Current Medication list given to you today.  *If you need a refill on your cardiac medications before your next appointment, please call your pharmacy*  Lab Work: NONE  Testing/Procedures: A chest x-ray takes a picture of the organs and structures inside the chest, including the heart, lungs, and blood vessels. This test can show several things, including, whether the heart is enlarges; whether fluid is building up in the lungs; and whether pacemaker / defibrillator leads are still in place.  Follow-Up: At [facility], you and your health needs are our priority.  As part of our continuing mission to provide you with exceptional heart care, we have created designated Provider Care Teams.  These Care Teams include your primary Cardiologist (physician) and Advanced Practice Providers (APPs -  Physician Assistants and Nurse Practitioners) who all work together to provide you with the care you need, when you need it.  We recommend signing up for the patient portal called "MyChart".  Sign up information is provided on this After Visit Summary.  MyChart is used to connect with patients for Virtual Visits (Telemedicine).  Patients are able to view lab/test results, encounter notes, upcoming appointments, etc.  Non-urgent messages can be sent to your provider as well.   To learn more about what you can do with MyChart, go to [email or web address].    Your next appointment:   6 month(s)  Provider:   [name], MD   Other Instructions You have been referred PHYSICAL THERAPY

## 2022-04-20 ENCOUNTER — Telehealth (HOSPITAL_BASED_OUTPATIENT_CLINIC_OR_DEPARTMENT_OTHER): Payer: Self-pay | Admitting: *Deleted

## 2022-04-20 ENCOUNTER — Other Ambulatory Visit (HOSPITAL_BASED_OUTPATIENT_CLINIC_OR_DEPARTMENT_OTHER): Payer: Self-pay

## 2022-04-20 NOTE — Telephone Encounter (Addendum)
Received confirmation PT received referral faxed Fax [phone]

## 2022-05-01 ENCOUNTER — Other Ambulatory Visit (HOSPITAL_BASED_OUTPATIENT_CLINIC_OR_DEPARTMENT_OTHER): Payer: Self-pay | Admitting: Cardiovascular Disease

## 2022-05-01 ENCOUNTER — Other Ambulatory Visit (HOSPITAL_BASED_OUTPATIENT_CLINIC_OR_DEPARTMENT_OTHER): Payer: Self-pay

## 2022-05-01 DIAGNOSIS — M5451 Vertebrogenic low back pain: Secondary | ICD-10-CM | POA: Diagnosis not present

## 2022-05-01 DIAGNOSIS — M6281 Muscle weakness (generalized): Secondary | ICD-10-CM | POA: Diagnosis not present

## 2022-05-03 ENCOUNTER — Other Ambulatory Visit (HOSPITAL_BASED_OUTPATIENT_CLINIC_OR_DEPARTMENT_OTHER): Payer: Self-pay

## 2022-05-03 MED ORDER — TORSEMIDE 20 MG PO TABS
40.0000 mg | ORAL_TABLET | Freq: Every day | ORAL | 1 refills | Status: DC
  Filled 2022-05-03: qty 180, 90d supply, fill #0

## 2022-05-03 NOTE — Telephone Encounter (Signed)
Rx request sent to pharmacy.  

## 2022-05-12 DIAGNOSIS — M5451 Vertebrogenic low back pain: Secondary | ICD-10-CM | POA: Diagnosis not present

## 2022-05-12 DIAGNOSIS — M6281 Muscle weakness (generalized): Secondary | ICD-10-CM | POA: Diagnosis not present

## 2022-05-14 ENCOUNTER — Ambulatory Visit (INDEPENDENT_AMBULATORY_CARE_PROVIDER_SITE_OTHER): Payer: Medicare Other

## 2022-05-14 DIAGNOSIS — I495 Sick sinus syndrome: Secondary | ICD-10-CM

## 2022-05-14 LAB — CUP PACEART REMOTE DEVICE CHECK
Battery Remaining Longevity: 108 mo
Battery Voltage: 3.01 V
Brady Statistic AP VP Percent: 77.49 %
Brady Statistic AP VS Percent: 0.18 %
Brady Statistic AS VP Percent: 1.12 %
Brady Statistic AS VS Percent: 21.22 %
Brady Statistic RA Percent Paced: 75.87 %
Brady Statistic RV Percent Paced: 76.76 %
Date Time Interrogation Session: 20240405115510
Implantable Lead Connection Status: 753985
Implantable Lead Connection Status: 753985
Implantable Lead Implant Date: 20101109
Implantable Lead Implant Date: 20101109
Implantable Lead Location: 753859
Implantable Lead Location: 753860
Implantable Lead Model: 4469
Implantable Lead Model: 4470
Implantable Lead Serial Number: 535369
Implantable Lead Serial Number: 657967
Implantable Pulse Generator Implant Date: 20210708
Lead Channel Impedance Value: 285 Ohm
Lead Channel Impedance Value: 418 Ohm
Lead Channel Impedance Value: 418 Ohm
Lead Channel Impedance Value: 456 Ohm
Lead Channel Pacing Threshold Amplitude: 0.625 V
Lead Channel Pacing Threshold Amplitude: 1 V
Lead Channel Pacing Threshold Pulse Width: 0.4 ms
Lead Channel Pacing Threshold Pulse Width: 0.4 ms
Lead Channel Sensing Intrinsic Amplitude: 0.125 mV
Lead Channel Sensing Intrinsic Amplitude: 0.125 mV
Lead Channel Sensing Intrinsic Amplitude: 6.375 mV
Lead Channel Sensing Intrinsic Amplitude: 6.375 mV
Lead Channel Setting Pacing Amplitude: 2 V
Lead Channel Setting Pacing Amplitude: 2.5 V
Lead Channel Setting Pacing Pulse Width: 0.4 ms
Lead Channel Setting Sensing Sensitivity: 1.2 mV
Zone Setting Status: 755011
Zone Setting Status: 755011

## 2022-05-18 ENCOUNTER — Other Ambulatory Visit (HOSPITAL_BASED_OUTPATIENT_CLINIC_OR_DEPARTMENT_OTHER): Payer: Self-pay

## 2022-05-19 DIAGNOSIS — E039 Hypothyroidism, unspecified: Secondary | ICD-10-CM | POA: Diagnosis not present

## 2022-05-19 DIAGNOSIS — I10 Essential (primary) hypertension: Secondary | ICD-10-CM | POA: Diagnosis not present

## 2022-05-19 DIAGNOSIS — D649 Anemia, unspecified: Secondary | ICD-10-CM | POA: Diagnosis not present

## 2022-05-19 DIAGNOSIS — E785 Hyperlipidemia, unspecified: Secondary | ICD-10-CM | POA: Diagnosis not present

## 2022-05-24 DIAGNOSIS — K080 Exfoliation of teeth due to systemic causes: Secondary | ICD-10-CM | POA: Diagnosis not present

## 2022-05-24 DIAGNOSIS — M5451 Vertebrogenic low back pain: Secondary | ICD-10-CM | POA: Diagnosis not present

## 2022-05-24 DIAGNOSIS — M6281 Muscle weakness (generalized): Secondary | ICD-10-CM | POA: Diagnosis not present

## 2022-05-25 ENCOUNTER — Ambulatory Visit
Admission: RE | Admit: 2022-05-25 | Discharge: 2022-05-25 | Disposition: A | Discharge status: Home / Self Care | Payer: Medicare Other | Source: Ambulatory Visit | Attending: Internal Medicine | Admitting: Internal Medicine

## 2022-05-25 DIAGNOSIS — Z1231 Encounter for screening mammogram for malignant neoplasm of breast: Secondary | ICD-10-CM | POA: Diagnosis not present

## 2022-05-26 DIAGNOSIS — Z1339 Encounter for screening examination for other mental health and behavioral disorders: Secondary | ICD-10-CM | POA: Diagnosis not present

## 2022-05-26 DIAGNOSIS — I110 Hypertensive heart disease with heart failure: Secondary | ICD-10-CM | POA: Diagnosis not present

## 2022-05-26 DIAGNOSIS — R82998 Other abnormal findings in urine: Secondary | ICD-10-CM | POA: Diagnosis not present

## 2022-05-26 DIAGNOSIS — I10 Essential (primary) hypertension: Secondary | ICD-10-CM | POA: Diagnosis not present

## 2022-05-26 DIAGNOSIS — I5032 Chronic diastolic (congestive) heart failure: Secondary | ICD-10-CM | POA: Diagnosis not present

## 2022-05-26 DIAGNOSIS — Z1331 Encounter for screening for depression: Secondary | ICD-10-CM | POA: Diagnosis not present

## 2022-05-26 DIAGNOSIS — Z0000 Encounter for general adult medical examination without abnormal findings: Secondary | ICD-10-CM | POA: Diagnosis not present

## 2022-05-26 DIAGNOSIS — I480 Paroxysmal atrial fibrillation: Secondary | ICD-10-CM | POA: Diagnosis not present

## 2022-05-26 DIAGNOSIS — I872 Venous insufficiency (chronic) (peripheral): Secondary | ICD-10-CM | POA: Diagnosis not present

## 2022-05-31 ENCOUNTER — Other Ambulatory Visit (HOSPITAL_BASED_OUTPATIENT_CLINIC_OR_DEPARTMENT_OTHER): Payer: Self-pay

## 2022-05-31 MED ORDER — CIPROFLOXACIN HCL 250 MG PO TABS
250.0000 mg | ORAL_TABLET | Freq: Two times a day (BID) | ORAL | 0 refills | Status: DC
  Filled 2022-05-31: qty 14, 7d supply, fill #0

## 2022-06-01 DIAGNOSIS — K080 Exfoliation of teeth due to systemic causes: Secondary | ICD-10-CM | POA: Diagnosis not present

## 2022-06-02 ENCOUNTER — Encounter (HOSPITAL_BASED_OUTPATIENT_CLINIC_OR_DEPARTMENT_OTHER): Payer: Self-pay | Admitting: Cardiovascular Disease

## 2022-06-02 DIAGNOSIS — R0602 Shortness of breath: Secondary | ICD-10-CM

## 2022-06-02 DIAGNOSIS — Z5181 Encounter for therapeutic drug level monitoring: Secondary | ICD-10-CM

## 2022-06-03 NOTE — Telephone Encounter (Signed)
Results not available for my review.   As Dr. Duke Salvia has previously mentioned, there is a careful balance between getting fluid off and not drying out the kidneys. We prevent fluid retention by not giving the heart/kidneys too much fluid to process. Recommend restricting to less than 2L (64 oz) per day. Recommend she continue to wear compression stockings during the day, elevate legs while sitting, follow a low sodium diet.  May adjust Torsemide to 2 tablets M and F with 1 tablet W. Recommend BMP/BNP in 1 week for monitoring.   If she will weigh daily and keep BP log once per day and provide that to [place] in a week it will help [place] to reassess medications and determine if further changes needed.   Alver Sorrow, NP

## 2022-06-07 ENCOUNTER — Other Ambulatory Visit: Payer: Self-pay | Admitting: Physician Assistant

## 2022-06-07 ENCOUNTER — Other Ambulatory Visit (HOSPITAL_BASED_OUTPATIENT_CLINIC_OR_DEPARTMENT_OTHER): Payer: Self-pay

## 2022-06-07 DIAGNOSIS — K080 Exfoliation of teeth due to systemic causes: Secondary | ICD-10-CM | POA: Diagnosis not present

## 2022-06-07 MED ORDER — FLECAINIDE ACETATE 50 MG PO TABS
50.0000 mg | ORAL_TABLET | Freq: Two times a day (BID) | ORAL | 11 refills | Status: DC
  Filled 2022-06-07: qty 60, 30d supply, fill #0
  Filled 2022-07-06: qty 60, 30d supply, fill #1
  Filled 2022-08-02: qty 60, 30d supply, fill #2
  Filled 2022-09-01: qty 60, 30d supply, fill #3
  Filled 2022-10-05: qty 60, 30d supply, fill #4
  Filled 2022-10-31: qty 60, 30d supply, fill #5
  Filled 2022-12-02: qty 60, 30d supply, fill #6
  Filled 2023-01-04: qty 60, 30d supply, fill #7
  Filled 2023-02-01: qty 60, 30d supply, fill #8
  Filled 2023-03-03 (×2): qty 60, 30d supply, fill #9
  Filled 2023-04-08 (×2): qty 60, 30d supply, fill #10
  Filled 2023-05-03: qty 60, 30d supply, fill #11

## 2022-06-10 NOTE — Telephone Encounter (Signed)
Bp log

## 2022-06-11 DIAGNOSIS — R0602 Shortness of breath: Secondary | ICD-10-CM | POA: Diagnosis not present

## 2022-06-11 DIAGNOSIS — Z5181 Encounter for therapeutic drug level monitoring: Secondary | ICD-10-CM | POA: Diagnosis not present

## 2022-06-12 LAB — BASIC METABOLIC PANEL
BUN/Creatinine Ratio: 28 (ref 12–28)
BUN: 31 mg/dL — ABNORMAL HIGH (ref 8–27)
CO2: 24 mmol/L (ref 20–29)
Calcium: 8.9 mg/dL (ref 8.7–10.3)
Chloride: 95 mmol/L — ABNORMAL LOW (ref 96–106)
Creatinine, Ser: 1.11 mg/dL — ABNORMAL HIGH (ref 0.57–1.00)
Glucose: 87 mg/dL (ref 70–99)
Potassium: 4.4 mmol/L (ref 3.5–5.2)
Sodium: 135 mmol/L (ref 134–144)
eGFR: 48 mL/min/{1.73_m2} — ABNORMAL LOW (ref >59)

## 2022-06-12 LAB — BRAIN NATRIURETIC PEPTIDE: BNP: 277.9 pg/mL — ABNORMAL HIGH (ref 0.0–100.0)

## 2022-06-14 NOTE — Telephone Encounter (Signed)
Pt. Following up, repeat labs in 1-2 weeks?

## 2022-06-15 NOTE — Telephone Encounter (Signed)
Pt. Responding to you!  

## 2022-06-15 NOTE — Progress Notes (Signed)
Remote pacemaker transmission.   

## 2022-06-17 ENCOUNTER — Other Ambulatory Visit (HOSPITAL_BASED_OUTPATIENT_CLINIC_OR_DEPARTMENT_OTHER): Payer: Self-pay

## 2022-06-18 ENCOUNTER — Other Ambulatory Visit (HOSPITAL_BASED_OUTPATIENT_CLINIC_OR_DEPARTMENT_OTHER): Payer: Self-pay

## 2022-06-18 MED ORDER — TEMAZEPAM 30 MG PO CAPS
30.0000 mg | ORAL_CAPSULE | Freq: Every evening | ORAL | 1 refills | Status: DC
  Filled 2022-06-18 – 2022-06-19 (×2): qty 90, 90d supply, fill #0
  Filled 2022-09-12: qty 90, 90d supply, fill #1

## 2022-06-19 ENCOUNTER — Other Ambulatory Visit (HOSPITAL_BASED_OUTPATIENT_CLINIC_OR_DEPARTMENT_OTHER): Payer: Self-pay

## 2022-06-21 NOTE — Telephone Encounter (Signed)
Likely just need to get her in for OV with Dr. Duke Salvia or myself. This MyChart management is becoming cumbersome.   Alver Sorrow, NP

## 2022-06-29 ENCOUNTER — Telehealth: Payer: Self-pay | Admitting: Physician Assistant

## 2022-06-29 NOTE — Telephone Encounter (Signed)
Please offer patient appt on 07/09/22 at 11 am with Hyacinth Meeker, PA. Soonest appt available with Victorino Dike. Thanks

## 2022-06-29 NOTE — Telephone Encounter (Signed)
Inbound call from patient wanting to speak with a nurse in regards to her having abdominal pain. Offered patient to schedule an appointment but patient states she would rather speak with a nurse for a sooner apt. Please advise.  Thank you

## 2022-06-29 NOTE — Telephone Encounter (Signed)
Advised patient of apt. Patient confirmed

## 2022-07-06 ENCOUNTER — Other Ambulatory Visit (HOSPITAL_BASED_OUTPATIENT_CLINIC_OR_DEPARTMENT_OTHER): Payer: Self-pay

## 2022-07-09 ENCOUNTER — Ambulatory Visit: Payer: Medicare Other | Admitting: Physician Assistant

## 2022-07-09 ENCOUNTER — Ambulatory Visit (HOSPITAL_BASED_OUTPATIENT_CLINIC_OR_DEPARTMENT_OTHER): Payer: Medicare Other | Admitting: Family

## 2022-07-09 ENCOUNTER — Encounter (HOSPITAL_BASED_OUTPATIENT_CLINIC_OR_DEPARTMENT_OTHER): Payer: Self-pay | Admitting: Family

## 2022-07-09 ENCOUNTER — Other Ambulatory Visit (HOSPITAL_BASED_OUTPATIENT_CLINIC_OR_DEPARTMENT_OTHER): Payer: Self-pay

## 2022-07-09 ENCOUNTER — Encounter: Payer: Self-pay | Admitting: Physician Assistant

## 2022-07-09 VITALS — BP 100/54 | HR 64 | Ht 60.0 in | Wt 123.2 lb

## 2022-07-09 VITALS — BP 106/64 | HR 67 | Ht 60.0 in | Wt 122.0 lb

## 2022-07-09 DIAGNOSIS — I495 Sick sinus syndrome: Secondary | ICD-10-CM

## 2022-07-09 DIAGNOSIS — E785 Hyperlipidemia, unspecified: Secondary | ICD-10-CM | POA: Diagnosis not present

## 2022-07-09 DIAGNOSIS — I25118 Atherosclerotic heart disease of native coronary artery with other forms of angina pectoris: Secondary | ICD-10-CM | POA: Diagnosis not present

## 2022-07-09 DIAGNOSIS — I5032 Chronic diastolic (congestive) heart failure: Secondary | ICD-10-CM

## 2022-07-09 DIAGNOSIS — R198 Other specified symptoms and signs involving the digestive system and abdomen: Secondary | ICD-10-CM

## 2022-07-09 DIAGNOSIS — I10 Essential (primary) hypertension: Secondary | ICD-10-CM

## 2022-07-09 DIAGNOSIS — K5900 Constipation, unspecified: Secondary | ICD-10-CM

## 2022-07-09 DIAGNOSIS — K219 Gastro-esophageal reflux disease without esophagitis: Secondary | ICD-10-CM | POA: Diagnosis not present

## 2022-07-09 DIAGNOSIS — D6859 Other primary thrombophilia: Secondary | ICD-10-CM

## 2022-07-09 DIAGNOSIS — I4819 Other persistent atrial fibrillation: Secondary | ICD-10-CM

## 2022-07-09 MED ORDER — TORSEMIDE 20 MG PO TABS
20.0000 mg | ORAL_TABLET | Freq: Every day | ORAL | 3 refills | Status: DC
  Filled 2022-07-09 – 2022-07-10 (×3): qty 90, 90d supply, fill #0

## 2022-07-09 NOTE — Progress Notes (Signed)
Noted  

## 2022-07-09 NOTE — Progress Notes (Unsigned)
Office Visit    Patient Name: Kathryn Gibson Date of Encounter: 07/09/2022  PCP:  Chilton Greathouse, MD   [facility]  Cardiologist:  Chilton Si, MD  Advanced Practice Provider:  No care team member to display Electrophysiologist:  Maurice Small, MD     Chief Complaint    Kathryn Gibson is a 89 y.o. female presents today for dyspnea.   Past Medical History    Past Medical History:  Diagnosis Date   Arthritis    Atrial fibrillation (HCC)    Atypical chest pain 09/22/2020   Blood transfusion without reported diagnosis    2017   Bradycardia    s/p PPM   CAD in native artery 03/26/2021   Cataract    Chronic diastolic heart failure (HCC) 03/26/2021   Colonic polyp    adenomatous   Constipation    Diverticulosis    Hemorrhoids    Hyperlipidemia    Hypertension    Hypothyroidism    Irritable bowel syndrome (IBS)    Lower extremity edema 09/22/2020   Osteoporosis    osteopenia   Pacemaker 2010   UTI (urinary tract infection)    Past Surgical History:  Procedure Laterality Date   BREAST CYST ASPIRATION Left 1980s   COLONOSCOPY     ELECTROPHYSIOLOGIC STUDY N/A 07/01/2015   Procedure: Atrial Fibrillation Ablation;  Surgeon: Hillis Range, MD;  Location: [facility] INVASIVE CV LAB;  Service: Cardiovascular;  Laterality: N/A;   EYE SURGERY     heart ablation     for atrial fib   HEMATOMA EVACUATION Right 07/15/2014   Procedure: EVACUATION Right Groin Hematoma;  Surgeon: Larina Earthly, MD;  Location: [facility] OR;  Service: Vascular;  Laterality: Right;   NASAL ENDOSCOPY WITH EPISTAXIS CONTROL N/A 07/14/2014   Procedure: NASAL ENDOSCOPY WITH EPISTAXIS CONTROL;  Surgeon: Melvenia Beam, MD;  Location: [facility] OR;  Service: ENT;  Laterality: N/A;   NASAL ENDOSCOPY WITH EPISTAXIS CONTROL Bilateral 07/15/2014   Procedure: NASAL ENDOSCOPY WITH EPISTAXIS CONTROL;  Surgeon: Melvenia Beam, MD;  Location: [facility] OR;  Service: ENT;  Laterality: Bilateral;   PACEMAKER INSERTION   2010   implanted by Dr Deborah Chalk (MDT)   PPM GENERATOR CHANGEOUT N/A 08/16/2019   Procedure: PPM GENERATOR CHANGEOUT;  Surgeon: Hillis Range, MD;  Location: [facility] INVASIVE CV LAB;  Service: Cardiovascular;  Laterality: N/A;   RADIOLOGY WITH ANESTHESIA N/A 07/14/2014   Procedure: RADIOLOGY WITH ANESTHESIA;  Surgeon: Medication Radiologist, MD;  Location: [facility] OR;  Service: Radiology;  Laterality: N/A;   TEE WITHOUT CARDIOVERSION N/A 07/01/2015   Procedure: TRANSESOPHAGEAL ECHOCARDIOGRAM (TEE);  Surgeon: Quintella Reichert, MD;  Location: [facility] ENDOSCOPY;  Service: Cardiovascular;  Laterality: N/A;    Allergies  Allergies  Allergen Reactions   Trazodone And Nefazodone     "twitching" and trembling    History of Present Illness    Kathryn Gibson is a 89 y.o. female with a hx of atrial fibrillation s/p ablation, SSS s/p PPM, coronary calcification, HTN, exertional dyspnea, diastolic heart failure last seen 04/14/22 by Dr. Duke Salvia.  Prior patient of Dr. Patty Sermons. Underwent ablation for atrial fib 07/01/15. Recurrent episode of atrial fib 12/2015. Amiodarone had to be discontinued due to elevated LFTs which subsequently normalized. Metoprolol later increased for palpitations. Nuclear stress test 09/2020 normal.   Last seen 04/14/22. She had viral infection 02/2022 and COVID thereafter associated with cough, shortness of breath. She had resumed her BP regimen as had held during infection due to  hypotension. CXR showed emphysema without evidence of acute cardiopulmonary disease.   Since last seen multiple MyChart messages regarding edema, shortness of breath, renal function, and diuretic dosing. She was recommended for in office visit.   Presents today for follow up independently. Pleasant lady who resides at [facility]. Tells me starting Monday she had the best day in some time. Went to the pool where she stayed for over an hour, came home for lunch, did some ironing. Her exertional dyspnea has been intermittent.  Able to participate in exercise classes such as yoga at [facility]. More recently taking Torsemide 20mg  QD. She is wearing compression stockings regularly with helps with her swelling.Does note swelling worse if she spends a lot of time on her computer. Follows low salt diet. Drinking 60-74 oz of water per day. She is concerned as she does not use restroom overnight as most of her friends do. Reiterated it is okay to not have to get up overnight to use restroom. BP at home 110-120s/60s. She was in the middle of physical therapy when she got sick several weeks ago. Did have respiratory illness about 3 weeks ago. Discussed benefits of returning to PT.  EKGs/Labs/Other Studies Reviewed:   The following studies were reviewed today: Cardiac Studies & Procedures     STRESS TESTS  MYOCARDIAL PERFUSION IMAGING 09/26/2020  Narrative  There was no ST segment deviation noted during stress.  The study is normal. Negative for ischemia.  This is a low risk study.  This study was not gated.   ECHOCARDIOGRAM  ECHOCARDIOGRAM COMPLETE 04/11/2020  Narrative ECHOCARDIOGRAM REPORT    Patient Name:   Kathryn Gibson Date of Exam: 04/11/2020 Medical Rec #:  [id number]       Height:       60.5 in Accession #:    [id number]      Weight:       129.0 lb Date of Birth:  March 17, 1933       BSA:          1.559 m Patient Age:    86 years        BP:           126/74 mmHg Patient Gender: F               HR:           74 bpm. Exam Location:  Outpatient  Procedure: 2D Echo, 3D Echo, Cardiac Doppler, Color Doppler and Strain Analysis  Indications:    DOE (dyspnea on exertion) [242095]  History:        Patient has prior history of Echocardiogram examinations, most recent 11/06/2019. Pacemaker, Arrythmias:Atrial Fibrillation and Bradycardia; Risk Factors:Hypertension. Hypothyroid.  Sonographer:    Leta Jungling RDCS Referring Phys: [id number] JESSE M CLEAVER  IMPRESSIONS   1. Left ventricular ejection fraction,  by estimation, is 60 to 65%. The left ventricle has normal function. The left ventricle has no regional wall motion abnormalities. There is mild left ventricular hypertrophy. Left ventricular diastolic parameters are consistent with Grade III diastolic dysfunction (restrictive). Elevated left atrial pressure. 2. Right ventricular systolic function is normal. The right ventricular size is normal. There is mildly elevated pulmonary artery systolic pressure. The estimated right ventricular systolic pressure is 35.7 mmHg. 3. Left atrial size was moderately dilated. 4. Right atrial size was mildly dilated. 5. The mitral valve is normal in structure. Mild to moderate mitral valve regurgitation. 6. Tricuspid valve regurgitation is mild to moderate. 7. The aortic valve is tricuspid. Aortic  valve regurgitation is moderate. Mild aortic valve sclerosis is present, with no evidence of aortic valve stenosis. 8. The inferior vena cava is normal in size with greater than 50% respiratory variability, suggesting right atrial pressure of 3 mmHg.  FINDINGS Left Ventricle: Left ventricular ejection fraction, by estimation, is 60 to 65%. The left ventricle has normal function. The left ventricle has no regional wall motion abnormalities. The left ventricular internal cavity size was normal in size. There is mild left ventricular hypertrophy. Left ventricular diastolic parameters are consistent with Grade III diastolic dysfunction (restrictive). Elevated left atrial pressure.  Right Ventricle: The right ventricular size is normal. No increase in right ventricular wall thickness. Right ventricular systolic function is normal. There is mildly elevated pulmonary artery systolic pressure. The tricuspid regurgitant velocity is 2.86 m/s, and with an assumed right atrial pressure of 3 mmHg, the estimated right ventricular systolic pressure is 35.7 mmHg.  Left Atrium: Left atrial size was moderately dilated.  Right Atrium:  Right atrial size was mildly dilated.  Pericardium: There is no evidence of pericardial effusion.  Mitral Valve: The mitral valve is normal in structure. Mild to moderate mitral valve regurgitation.  Tricuspid Valve: The tricuspid valve is normal in structure. Tricuspid valve regurgitation is mild to moderate.  Aortic Valve: The aortic valve is tricuspid. Aortic valve regurgitation is moderate. Aortic regurgitation PHT measures 345 msec. Mild aortic valve sclerosis is present, with no evidence of aortic valve stenosis.  Pulmonic Valve: The pulmonic valve was not well visualized. Pulmonic valve regurgitation is trivial.  Aorta: The aortic root and ascending aorta are structurally normal, with no evidence of dilitation.  Venous: The inferior vena cava is normal in size with greater than 50% respiratory variability, suggesting right atrial pressure of 3 mmHg.  IAS/Shunts: No atrial level shunt detected by color flow Doppler.   LEFT VENTRICLE PLAX 2D LVIDd:         4.50 cm  Diastology LVIDs:         2.60 cm  LV e' medial:    6.20 cm/s LV PW:         0.90 cm  LV E/e' medial:  17.9 LV IVS:        1.10 cm  LV e' lateral:   6.53 cm/s LVOT diam:     2.10 cm  LV E/e' lateral: 17.0 LV SV:         60 LV SV Index:   38 LVOT Area:     3.46 cm  3D Volume EF: 3D EF:        66 % LV EDV:       122 ml LV ESV:       42 ml LV SV:        80 ml  RIGHT VENTRICLE RV S prime:     11.90 cm/s TAPSE (M-mode): 1.9 cm  LEFT ATRIUM             Index       RIGHT ATRIUM           Index LA diam:        4.50 cm 2.89 cm/m  RA Area:     18.90 cm LA Vol (A2C):   64.4 ml 41.31 ml/m RA Volume:   53.00 ml  34.00 ml/m LA Vol (A4C):   67.2 ml 43.11 ml/m LA Biplane Vol: 72.7 ml 46.64 ml/m AORTIC VALVE LVOT Vmax:   73.10 cm/s LVOT Vmean:  53.100 cm/s LVOT VTI:    0.172  m AI PHT:      345 msec  AORTA Ao Root diam: 2.80 cm Ao Asc diam:  3.40 cm  MITRAL VALVE                 TRICUSPID VALVE MV Area  (PHT): 4.80 cm      TR Peak grad:   32.7 mmHg MV Decel Time: 158 msec      TR Vmax:        286.00 cm/s MR Peak grad:    103.2 mmHg MR Mean grad:    69.0 mmHg   SHUNTS MR Vmax:         508.00 cm/s Systemic VTI:  0.17 m MR Vmean:        392.0 cm/s  Systemic Diam: 2.10 cm MR PISA:         1.57 cm MR PISA Eff ROA: 12 mm MR PISA Radius:  0.50 cm MV E velocity: 111.00 cm/s MV A velocity: 51.00 cm/s MV E/A ratio:  2.18  Epifanio Lesches MD Electronically signed by Epifanio Lesches MD Signature Date/Time: 04/11/2020/4:37:23 PM    Final     CT SCANS  CT CORONARY MORPH W/CTA COR W/SCORE 06/26/2015  Addendum 06/26/2015  3:10 PM ADDENDUM REPORT: 06/26/2015 15:07 CLINICAL DATA:  Pre Ablation EXAM: Cardiac CTA MEDICATIONS: None TECHNIQUE: The patient was scanned on a Philips 256 slice scanner. Gantry rotation speed was 270 msecs. Collimation was .9mm. A 100 kV prospective scan was triggered in the descending thoracic aorta at 111 HU's with 5% padding centered around 78% of the R-R interval. Average HR during the scan was 65 bpm. The 3D data set was interpreted on a dedicated work station using MPR, MIP and VRT modes. A total of 80cc of contrast was used. FINDINGS: Non-cardiac: See separate report from [facility]. No significant findings on limited lung and soft tissue windows. Calcium Score:  188 with calcium in circumflex and LAD Severe biatrial enlargement. Pacing system in RA/RV. No ASD or pericardial effusion. LAA is enlarged with likely thrombus formation in the mid body and apex. 4 pulmonary Veins drain normally into LA with no anomaly. No pericardial effusion. Esophagus courses behind LLPV RSPV:  Ostium 20 mm Area 3.9 cm2 RIPV:  Ostium 18 mm Area 2.4 cm2 LUPV:  Ostium 16 mm Area 2.6 cm2 LLPV:  Ostium 20 mm Area 2.4 cm2 IMPRESSION: 1) Severe biatrial enlargement with pacemaker wires in RA/RV 2) Dilated LAA with probably thrombus in mid body and apex 3)  Normal pulmonary vein anatomy with no anomaly see measurements above 4) No pericardial effusion 5) No ASD 6) Esophagus courses behind LLPV 7) Calcium score 188 66th percentile for age and sex matched controls Charlton Haws Electronically Signed By: Charlton Haws M.D. On: 06/26/2015 15:07  Narrative EXAM: OVER-READ INTERPRETATION  CT CHEST  The following report is an over-read performed by radiologist Dr. Royal Piedra Ku [facility] on 06/26/2015. This over-read does not include interpretation of cardiac or coronary anatomy or pathology. The coronary calcium score/coronary CTA interpretation by the cardiologist is attached.  COMPARISON:  No priors.  FINDINGS: Within the visualized portions of the thorax there are no suspicious appearing pulmonary nodules or masses, there is no acute consolidative airspace disease, no pleural effusions, no pneumothorax and no lymphadenopathy. Tiny calcified granuloma in the lateral segment of the right middle lobe incidentally noted. Visualized portions of the upper abdomen are unremarkable. There are no aggressive appearing lytic or blastic lesions noted in the visualized portions of  the skeleton.  IMPRESSION: 1. No significant incidental noncardiac findings noted.  Electronically Signed: By: Trudie Reed M.D. On: 06/26/2015 14:08           EKG:  EKG is not ordered today.  Recent Labs: 07/29/2021: Hemoglobin 12.3; Platelets 218 06/11/2022: BNP 277.9; BUN 31; Creatinine, Ser 1.11; Potassium 4.4; Sodium 135  Recent Lipid Panel    Component Value Date/Time   CHOL 167 10/08/2015 0848   TRIG 56 10/08/2015 0848   HDL 94 10/08/2015 0848   CHOLHDL 1.8 10/08/2015 0848   VLDL 11 10/08/2015 0848   LDLCALC 62 10/08/2015 0848    Risk Assessment/Calculations:   CHA2DS2-VASc Score = 6   This indicates a 9.7% annual risk of stroke. The patient's score is based upon: CHF History: 1 HTN History: 1 Diabetes History:  0 Stroke History: 0 Vascular Disease History: 1 Age Score: 2 Gender Score: 1     Home Medications   Current Meds  Medication Sig   acetaminophen (TYLENOL) 500 MG tablet Take 500-1,000 mg by mouth every 6 (six) hours as needed for moderate pain or headache.   Artificial Tear Ointment (DRY EYES OP) Place 1 drop into both eyes daily as needed (Dry eye).   Ascorbic Acid (VITAMIN C) 1000 MG tablet Take 1,000 mg by mouth daily.     calcium carbonate (OSCAL) 1500 (600 Ca) MG TABS tablet Take 1 tablet by mouth daily.   cholecalciferol (VITAMIN D) 1000 UNITS tablet Take 1,000 Units by mouth daily.     Coenzyme Q10 (CO Q-10 PO) Take 1 tablet by mouth daily.   cromolyn (OPTICROM) 4 % ophthalmic solution Instill 1 drop into both eyes four times a day   diltiazem (CARDIZEM CD) 360 MG 24 hr capsule Take 1 capsule (360 mg total) by mouth daily.   diltiazem (CARDIZEM) 30 MG tablet Take 1/2 to 1 tablet every 4 hours AS NEEDED for heart rate >100 as long as top blood pressure >100.   famotidine (PEPCID) 20 MG tablet Take 20 mg by mouth daily.   flecainide (TAMBOCOR) 50 MG tablet Take 1 tablet (50 mg total) by mouth 2 (two) times daily.   levothyroxine (SYNTHROID) 50 MCG tablet Take 1 tablet in the morning on an empty stomach by mouth once a week for 90 days   levothyroxine (SYNTHROID) 75 MCG tablet TAKE 75 MCG BY MOUTH FOR 6 DAYS A WEEK (TAKE 50 MCG THE REMAINING 1 DAY OF THE WEEK). Oral 90 days   methylcellulose (CITRUCEL) oral powder Take 2 packets by mouth daily.   metoprolol succinate (TOPROL XL) 50 MG 24 hr tablet Take 1.5 tablets (75 mg total) by mouth 2 (two) times daily.   Multiple Vitamins-Minerals (ICAPS AREDS 2 PO) Take 1 capsule by mouth daily.    Omega-3 Fatty Acids (FISH OIL) 1200 MG CAPS Take 1,200 mg by mouth daily. Mega Red.   polyethylene glycol powder (GLYCOLAX/MIRALAX) powder Take 0.5 Containers by mouth at bedtime.    pravastatin (PRAVACHOL) 10 MG tablet Take 1 tablet (10 mg total)  by mouth daily.   Probiotic Product (ALIGN PO) Take 1 capsule by mouth daily.   Rivaroxaban (XARELTO) 15 MG TABS tablet Take 1 tablet (15 mg total) by mouth daily with supper.   senna-docusate (SENOKOT S) 8.6-50 MG tablet Take 1 tablet by mouth as needed for mild constipation.   sodium chloride (OCEAN) 0.65 % SOLN nasal spray Place 1 spray into both nostrils daily.   temazepam (RESTORIL) 30 MG capsule Take 1  capsule (30 mg total) by mouth every evening.   torsemide (DEMADEX) 20 MG tablet Take 20 mg by mouth daily.   Zinc 50 MG CAPS Take 50 mg by mouth daily.      Review of Systems      All other systems reviewed and are otherwise negative except as noted above.  Physical Exam    VS:  BP 106/64   Pulse 67   Ht 5' (1.524 m)   Wt 122 lb (55.3 kg)   BMI 23.83 kg/m  , BMI Body mass index is 23.83 kg/m.  Wt Readings from Last 3 Encounters:  07/09/22 122 lb (55.3 kg)  07/09/22 123 lb 4 oz (55.9 kg)  04/14/22 121 lb (54.9 kg)     GEN: Well nourished, well developed, in no acute distress. HEENT: normal. Neck: Supple, no JVD, carotid bruits, or masses. Cardiac: RRR, no murmurs, rubs, or gallops. No clubbing, cyanosis, edema.  Radials/PT 2+ and equal bilaterally.  Respiratory:  Respirations regular and unlabored, clear to auscultation bilaterally. GI: Soft, nontender, nondistended. MS: No deformity or atrophy. Skin: Warm and dry, no rash. Neuro:  Strength and sensation are intact. Psych: Normal affect.  Assessment & Plan    HTN - BP well controlled. Continue current antihypertensive regimen.  Relative hypotension without lightheadedness nor dizziness.   Chronic diastolic heart failure - Euvolemic and well compensated on exam. GDMT Toprol, Torsemide. Continue Torsemide 20mg  QD with additional 20mg  PRN for weight gain of 2lbs overnight or 5 lbs in one week.  06/11/22 BNP 277.9 which showed overall stable mild volume overload (03/2021 BNP 310 ? 11/2022 BNP 224 ? 06/11/22 BNP 277.9). This  appears to be her baseline and she is well compensated.  06/11/22 creatinine 1.11, GFR 48. Creatinine over the last year 0.77-1.12 with average 0.95. she politely declines repeat labs today as has upcoming OV with PCP early June. Will request BMP at that time. So long as creatinine stable, continue current dosing. If creatinine further increased, can adjust diuretic regimen to QOD.  CAD - nonobstructive by prior imaging. GDMT Toprol, Pravastatin, fish oil. Heart healthy diet and regular cardiovascular exercise encouraged.   Atrial fibrillation / Hypercoagulable state / High risk medications use - Continue Toprol 75mg  BID, Flecainide 50mg  BID, Diltiazem 360mg  QD. No recent palpitations.  CHA2DS2-VASc Score = 6 [CHF History: 1, HTN History: 1, Diabetes History: 0, Stroke History: 0, Vascular Disease History: 1, Age Score: 2, Gender Score: 1].  Therefore, the patient's annual risk of stroke is 9.7 %.    Continue Xarelto 15mg  QD. Denies bleeding complications  Dyspnea - Multifactorial deconditioning, recent viral infection, HFpEF. Euvolemic today. Diuretic regimen, as above. Encouraged her to return to PT at [facility].        Disposition: Follow up  as scheduled  with Chilton Si, MD or APP.  Signed, Alver Sorrow, NP 07/09/2022, 3:10 PM [facility]

## 2022-07-09 NOTE — Patient Instructions (Signed)
Follow up as needed.   _______________________________________________________  If your blood pressure at your visit was 140/90 or greater, please contact your primary care physician to follow up on this.  _______________________________________________________  If you are age 87 or older, your body mass index should be between 23-30. Your Body mass index is 24.07 kg/m. If this is out of the aforementioned range listed, please consider follow up with your Primary Care Provider.  If you are age 77 or younger, your body mass index should be between 19-25. Your Body mass index is 24.07 kg/m. If this is out of the aformentioned range listed, please consider follow up with your Primary Care Provider.   ________________________________________________________  The [facility] providers would like to encourage you to use [facility] to communicate with providers for non-urgent requests or questions.  Due to long hold times on the telephone, sending your provider a message by [facility] may be a faster and more efficient way to get a response.  Please allow 48 business hours for a response.  Please remember that this is for non-urgent requests.  _______________________________________________________

## 2022-07-09 NOTE — Patient Instructions (Signed)
Medication Instructions:  Your physician has recommended you make the following change in your medication:   CONTINUE Torsemide 20mg  daily   You may take an additional tablet as needed for weight gain of 2 lbs overnight or 5 lbs in one week  *If you need a refill on your cardiac medications before your next appointment, please call your pharmacy*  Follow-Up: At [facility], you and your health needs are our priority.  As part of our continuing mission to provide you with exceptional heart care, we have created designated Provider Care Teams.  These Care Teams include your primary Cardiologist (physician) and Advanced Practice Providers (APPs -  Physician Assistants and Nurse Practitioners) who all work together to provide you with the care you need, when you need it.  We recommend signing up for the patient portal called "MyChart".  Sign up information is provided on this After Visit Summary.  MyChart is used to connect with patients for Virtual Visits (Telemedicine).  Patients are able to view lab/test results, encounter notes, upcoming appointments, etc.  Non-urgent messages can be sent to your provider as well.   To learn more about what you can do with MyChart, go to [email or web address].    Your next appointment:   As scheduled with the electrophysiology Francis Dowse, NP and with Dr. Duke Salvia

## 2022-07-09 NOTE — Progress Notes (Signed)
Chief Complaint: Follow-up abdominal pain  HPI:    Kathryn Gibson is an 89 year old female with a past medical history as listed below including A-fib, bradycardia status post pacemaker, IBS and multiple others, known to Dr. Marina Goodell, who was referred to me by Chilton Greathouse, MD for follow-up of abdominal pain.    06/12/2014 colonoscopy done for surveillance due to prior colonic neoplasia and PH Colon Adenoma. Multiple prior colonoscopies. Had HGD in small polyps 2006 and 2013.  At that time finding of severe diverticulosis in the left colon otherwise normal.  Repeat recommended in 5 years the patient was medically fit and willing.    04/11/2019 patient seen in clinic by Dr. Marina Goodell and at that time it was described she had a history of constipation predominant IBS and adenomatous colon polyps last evaluated June 2019.  Last colonoscopy 2013.  She reported 2-year history of fecal incontinence.  It was thought her fecal incontinence is secondary to poor rectal tone.  Recommend she is Citrucel 2 tablespoons daily.    03/22/2020 abdominal x-ray with gas-filled loops of mildly dilated bowel throughout the abdomen, favored represent ileus, although early distal large bowel obstruction was too difficult to exclude.  Mild stool burden and small right pleural effusion.  She was sent to the ER.    03/22/2020 patient seen in the ER for constipation.  Labs showed a sodium of 134, CBC with hemoglobin of 11.8, normal lipase.  CT of the abdomen pelvis with contrast showed fast transition state of the large bowel, fecalized material within the distal small bowel suggestive of an incomplete ileocecal valve or slow transition state from the small to large bowel, no findings of bowel obstruction or perforation.  Bilateral trace pleural effusions and interval increase in size and development of hypodense pancreatic lesions that could represent pseudocyst versus intraductal papillary mucinous neoplasms.  Is recommended she have  reimaging in 1 year with MRI/MRCP with pancreatic protocol.    03/24/2020 patient called and expressed that she was constipated not been able to go to the bathroom in a week.  She had been taking 2 capsules of MiraLAX and 6 Dulcolax tabs a day and also tried hot tea, 10 ounces of mag citrate twice with no relief.  Was recommended she try MiraLAX purge and remain on a liquid diet.    03/25/2020 office visit with me and at that time described not having a bowel movement for period of 6 days.  Initially went to the urgent care so she had loose ileus and went to the ER and had a CT which is reassuring.  She was using all the laxatives in the world including mag citrate etc. but nothing was working.  Most recently she had done a MiraLAX bowel purge overnight and did produce some liquid stool with sediment in it.  At that time repeated abdominal x-ray.    03/25/2020 x-ray showed nonspecific nonobstructive bowel gas pattern with a few scattered air-fluid levels likely over the colon.    05/08/2020 abdominal x-ray showed scattered air-fluid levels throughout the colon and small bowel without definitive obstructive changes.    05/09/2020 patient seen in the emergency department due to a decreased urine output in conjunction with her constipation.  CT the abdomen pelvis with contrast showed acute appearing compression fracture of the superior endplate of T12, no other acute intra-abdominal pelvic pathology, no bowel obstruction but moderate stool throughout the colon.  A 17 mm cystic structure in the distal pancreas is seen on prior CT.  Follow-up MRI/MRCP was recommended in 1 year.  CMP showed a decreased sodium of 125 and otherwise normal.    05/13/2020 patient seen in clinic and at that time described using MiraLAX at night and Citrucel in the morning.  She was growing constipated over the past 3 to 4 months with no real relief with daily laxatives and multiple bowel purge as as well as describes decreased urine output.  At  that visit schedule patient for diagnostic colonoscopy in the [facility] with a 2-day bowel prep.  Also repeated labs.  Told to continue MiraLAX nightly and Citrucel in the morning.  Recommend she discuss urinary symptoms with orthopedic physician to see if there is overlap given her recent T12 injury.    05/30/2020 colonoscopy with diverticulosis in the transverse and left colon and a mild rectal stricture.    03/04/2022 CTAP with and without contrast for follow-up of a renal cyst.  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  Also cystic lesions in the pancreatic head and tail not substantially changed in size.  Recommended follow-up pancreatic protocol MRI or CT in 2 years.    06/11/2022 BMP with a creatinine minimally elevated at 1.11.  BNP elevated 277.9.    06/29/2022 patient called and described abdominal pain.  She was given today's appointment.    Today, the patient tells me that when she called in a few weeks ago she was having issues but since then she has talked to the nurse at [facility] and they helped her.  She was feeling "blocked up", and the nurse at [facility] told her to take her normal MiraLAX 2 capfuls at night and then add Senokot which she took 4 tabs in the morning as well as another dose of MiraLAX she did this for 3 days and then felt like she was able to evacuate her bowels, now she is back on her normal MiraLAX and Colace and is trying to back off of the Senokot, currently taking 4 capsules a day.    Along with the above describes that occasionally she hears her stomach make noises and it feels like water is rushing, she tells me that "I feel it, but it does not hurt".  She just wants to make sure this is normal.    Also continues to have very occasional reflux symptoms for which she takes Pepcid and it helps.    Denies fever, chills, weight loss or blood in her stools.    Past Medical History:  Diagnosis Date    Arthritis    Atrial fibrillation (HCC)    Atypical chest pain 09/22/2020   Blood transfusion without reported diagnosis    2017   Bradycardia    s/p PPM   CAD in native artery 03/26/2021   Cataract    Chronic diastolic heart failure (HCC) 03/26/2021   Colonic polyp    adenomatous   Constipation    Diverticulosis    Hemorrhoids    Hyperlipidemia    Hypertension    Hypothyroidism    Irritable bowel syndrome (IBS)    Lower extremity edema 09/22/2020   Osteoporosis    osteopenia   Pacemaker 2010   UTI (urinary tract infection)     Past Surgical History:  Procedure Laterality Date   BREAST CYST ASPIRATION Left 1980s   COLONOSCOPY     ELECTROPHYSIOLOGIC STUDY N/A 07/01/2015   Procedure: Atrial Fibrillation Ablation;  Surgeon: Hillis Range, MD;  Location:  [facility];  Service: Cardiovascular;  Laterality: N/A;   EYE SURGERY     heart ablation     for atrial fib   HEMATOMA EVACUATION Right 07/15/2014   Procedure: EVACUATION Right Groin Hematoma;  Surgeon: Larina Earthly, MD;  Location: [facility] OR;  Service: Vascular;  Laterality: Right;   NASAL ENDOSCOPY WITH EPISTAXIS CONTROL N/A 07/14/2014   Procedure: NASAL ENDOSCOPY WITH EPISTAXIS CONTROL;  Surgeon: Melvenia Beam, MD;  Location: [facility] OR;  Service: ENT;  Laterality: N/A;   NASAL ENDOSCOPY WITH EPISTAXIS CONTROL Bilateral 07/15/2014   Procedure: NASAL ENDOSCOPY WITH EPISTAXIS CONTROL;  Surgeon: Melvenia Beam, MD;  Location: [facility] OR;  Service: ENT;  Laterality: Bilateral;   PACEMAKER INSERTION  2010   implanted by Dr Deborah Chalk (MDT)   PPM GENERATOR CHANGEOUT N/A 08/16/2019   Procedure: PPM GENERATOR CHANGEOUT;  Surgeon: Hillis Range, MD;  Location: [facility];  Service: Cardiovascular;  Laterality: N/A;   RADIOLOGY WITH ANESTHESIA N/A 07/14/2014   Procedure: RADIOLOGY WITH ANESTHESIA;  Surgeon: Medication Radiologist, MD;  Location: [facility] OR;  Service: Radiology;  Laterality: N/A;   TEE WITHOUT CARDIOVERSION N/A 07/01/2015   Procedure:  TRANSESOPHAGEAL ECHOCARDIOGRAM (TEE);  Surgeon: Quintella Reichert, MD;  Location: [facility] ENDOSCOPY;  Service: Cardiovascular;  Laterality: N/A;    Current Outpatient Medications  Medication Sig Dispense Refill   acetaminophen (TYLENOL) 500 MG tablet Take 500-1,000 mg by mouth every 6 (six) hours as needed for moderate pain or headache.     Artificial Tear Ointment (DRY EYES OP) Place 1 drop into both eyes daily as needed (Dry eye).     Ascorbic Acid (VITAMIN C) 1000 MG tablet Take 1,000 mg by mouth daily.       calcium carbonate (OSCAL) 1500 (600 Ca) MG TABS tablet Take 1 tablet by mouth daily.     cholecalciferol (VITAMIN D) 1000 UNITS tablet Take 1,000 Units by mouth daily.       ciprofloxacin (CIPRO) 250 MG tablet Take 1 tablet (250 mg total) by mouth every 12 (twelve) hours. 14 tablet 0   Coenzyme Q10 (CO Q-10 PO) Take 1 tablet by mouth daily.     cromolyn (OPTICROM) 4 % ophthalmic solution Instill 1 drop into both eyes four times a day 10 mL 11   diltiazem (CARDIZEM CD) 360 MG 24 hr capsule Take 1 capsule (360 mg total) by mouth daily. 90 capsule 2   diltiazem (CARDIZEM) 30 MG tablet Take 1/2 to 1 tablet every 4 hours AS NEEDED for heart rate >100 as long as top blood pressure >100. 45 tablet 3   famotidine (PEPCID) 20 MG tablet Take 20 mg by mouth daily.     flecainide (TAMBOCOR) 50 MG tablet Take 1 tablet (50 mg total) by mouth 2 (two) times daily. 60 tablet 11   levothyroxine (SYNTHROID) 50 MCG tablet Take 1 tablet in the morning on an empty stomach by mouth once a week for 90 days 36 tablet 3   levothyroxine (SYNTHROID) 75 MCG tablet TAKE 75 MCG BY MOUTH FOR 6 DAYS A WEEK (TAKE 50 MCG THE REMAINING 1 DAY OF THE WEEK). Oral 90 days 78 tablet 3   methylcellulose (CITRUCEL) oral powder Take 2 packets by mouth daily.     metoprolol succinate (TOPROL XL) 50 MG 24 hr tablet Take 1.5 tablets (75 mg total) by mouth 2 (two) times daily. 270 tablet 3   Multiple Vitamins-Minerals (ICAPS AREDS 2 PO) Take  1 capsule by mouth daily.  nystatin (MYCOSTATIN) 100000 UNIT/ML suspension Swish 5 mL and swallow before every meal and at bedtime. 150 mL 1   Omega-3 Fatty Acids (FISH OIL) 1200 MG CAPS Take 1,200 mg by mouth daily. Mega Red.     polyethylene glycol powder (GLYCOLAX/MIRALAX) powder Take 0.5 Containers by mouth at bedtime.   0   pravastatin (PRAVACHOL) 10 MG tablet Take 1 tablet (10 mg total) by mouth daily. 90 tablet 1   Probiotic Product (ALIGN PO) Take 1 capsule by mouth daily.     Rivaroxaban (XARELTO) 15 MG TABS tablet Take 1 tablet (15 mg total) by mouth daily with supper. 90 tablet 2   sodium chloride (OCEAN) 0.65 % SOLN nasal spray Place 1 spray into both nostrils daily.     temazepam (RESTORIL) 30 MG capsule Take 1 capsule (30 mg total) by mouth every evening. 90 capsule 1   torsemide (DEMADEX) 20 MG tablet Take 2 tablets (40 mg total) by mouth daily. 180 tablet 1   Zinc 50 MG CAPS Take 50 mg by mouth daily.      No current facility-administered medications for this visit.    Allergies as of 07/09/2022 - Review Complete 04/14/2022  Allergen Reaction Noted   Trazodone and nefazodone  06/13/2018    Family History  Problem Relation Age of Onset   Hypertension Mother    Congestive Heart Failure Mother    Dementia Mother    Heart attack Father 28       MI cause of death   Diabetes Paternal Grandfather    CAD Brother        2 stents   Lung cancer Son    Leukemia Daughter    Colon cancer Neg Hx    Kidney disease Neg Hx    Liver disease Neg Hx    Esophageal cancer Neg Hx    Stroke Neg Hx    Rectal cancer Neg Hx    Stomach cancer Neg Hx     Social History   Socioeconomic History   Marital status: Widowed    Spouse name: Not on file   Number of children: 3   Years of education: Not on file   Highest education level: Not on file  Occupational History   Occupation: retired  Tobacco Use   Smoking status: Former    Types: Cigarettes    Quit date: 02/09/1980     Years since quitting: 42.4   Smokeless tobacco: Never  Vaping Use   Vaping Use: Never used  Substance and Sexual Activity   Alcohol use: Yes    Alcohol/week: 7.0 standard drinks of alcohol    Types: 7 Glasses of wine per week   Drug use: No   Sexual activity: Not Currently  Other Topics Concern   Not on file  Social History Narrative   Not on file   Social Determinants of Health   Financial Resource Strain: Low Risk  (10/27/2021)   Overall Financial Resource Strain (CARDIA)    Difficulty of Paying Living Expenses: Not hard at all  Food Insecurity: No Food Insecurity (10/27/2021)   Hunger Vital Sign    Worried About Running Out of Food in the Last Year: Never true    Ran Out of Food in the Last Year: Never true  Transportation Needs: No Transportation Needs (10/27/2021)   PRAPARE - Administrator, Civil Service (Medical): No    Lack of Transportation (Non-Medical): No  Physical Activity: Not on file  Stress: Not on file  Social Connections: Not on file  Intimate Partner Violence: Not on file    Review of Systems:    Constitutional: No weight loss, fever or chills Cardiovascular: No chest pain Respiratory: No SOB  Gastrointestinal: See HPI and otherwise negative   Physical Exam:  Vital signs: BP (!) 100/54 (BP Location: Left Arm, Patient Position: Sitting, Cuff Size: Normal)   Pulse 64   Ht 5' (1.524 m)   Wt 123 lb 4 oz (55.9 kg)   BMI 24.07 kg/m    Constitutional:   Pleasant Elderly Caucasian female appears to be in NAD, Well developed, Well nourished, alert and cooperative Respiratory: Respirations even and unlabored. Lungs clear to auscultation bilaterally.   No wheezes, crackles, or rhonchi.  Cardiovascular: Normal S1, S2. No MRG. Regular rate and rhythm. No peripheral edema, cyanosis or pallor.  Gastrointestinal:  Soft, nondistended, nontender. No rebound or guarding. Normal bowel sounds. No appreciable masses or hepatomegaly. Rectal:  Not performed.   Psychiatric: Oriented to person, place and time. Demonstrates good judgement and reason without abnormal affect or behaviors.  RELEVANT LABS AND IMAGING: CBC    Component Value Date/Time   WBC 7.3 07/29/2021 1115   RBC 4.43 07/29/2021 1115   HGB 12.3 07/29/2021 1115   HGB 12.1 02/27/2020 1243   HCT 37.7 07/29/2021 1115   HCT 36.3 02/27/2020 1243   PLT 218 07/29/2021 1115   PLT 244 02/27/2020 1243   MCV 85.1 07/29/2021 1115   MCV 87 02/27/2020 1243   MCH 27.8 07/29/2021 1115   MCHC 32.6 07/29/2021 1115   RDW 14.3 07/29/2021 1115   RDW 12.6 02/27/2020 1243   LYMPHSABS 1.4 07/29/2021 1115   LYMPHSABS 2.1 02/27/2020 1243   MONOABS 1.1 (H) 07/29/2021 1115   EOSABS 0.1 07/29/2021 1115   EOSABS 0.1 02/27/2020 1243   BASOSABS 0.0 07/29/2021 1115   BASOSABS 0.0 02/27/2020 1243    CMP     Component Value Date/Time   NA 135 06/11/2022 1149   K 4.4 06/11/2022 1149   CL 95 (L) 06/11/2022 1149   CO2 24 06/11/2022 1149   GLUCOSE 87 06/11/2022 1149   GLUCOSE 107 (H) 07/29/2021 1115   BUN 31 (H) 06/11/2022 1149   CREATININE 1.11 (H) 06/11/2022 1149   CALCIUM 8.9 06/11/2022 1149   PROT 7.0 05/13/2020 1442   ALBUMIN 4.0 05/13/2020 1442   AST 19 05/13/2020 1442   ALT 14 05/13/2020 1442   ALKPHOS 86 05/13/2020 1442   BILITOT 0.4 05/13/2020 1442   GFRNONAA >60 07/29/2021 1115   GFRAA 58 (L) 02/27/2020 1243    Assessment: 1.  Borborygmi: Patient hears water rushing in her stomach but no abdominal pain 2.  Chronic anticoagulation for A-fib: On Xarelto 3.  GERD: Better on Pepcid 20 mg 4.  Chronic constipation: Last colonoscopy in 2022, constipation better with MiraLAX/Citrucel and Senokot  Plan: 1.  Reassured the patient that borborygmi is fine and normal and likely has just changed with her age as long as she is not having any discomfort. 2.  Discussed her current bowel regimen.  She is very good at monitoring this and using the medicines she has an hour to adjust.  Told her to  reach out to me through MyChart if she has trouble in the future. 3.  Continue Pepcid 4.  Patient to follow in clinic with [place] as needed.  Hyacinth Meeker, PA-C [facility] 07/09/2022, 10:56 AM  Cc: Chilton Greathouse, MD

## 2022-07-10 ENCOUNTER — Other Ambulatory Visit (HOSPITAL_BASED_OUTPATIENT_CLINIC_OR_DEPARTMENT_OTHER): Payer: Self-pay

## 2022-07-11 ENCOUNTER — Encounter (HOSPITAL_BASED_OUTPATIENT_CLINIC_OR_DEPARTMENT_OTHER): Payer: Self-pay | Admitting: Family

## 2022-07-27 NOTE — Progress Notes (Unsigned)
Cardiology Office Note:  .   Date:  07/28/2022  ID:  Kathryn Gibson, DOB 1933/08/02, MRN [id number] PCP: Chilton Greathouse, MD  [facility] Providers Cardiologist:  Chilton Si, MD Electrophysiologist:  Maurice Small, MD     History of Present Illness: .   Kathryn Gibson is a 89 y.o. female with PMH of HTN, HLD, chronic AFib, CAD, chronic dCHF, bradycardia/SSS s/p PPM.  She was last seen in EP Clinic in 12/2021 By Dr. Nelly Laurence for routine follow up. The were closely monitoring her renal function (Cr 0.92) which was stable at that time, no changes made to her flecainide.   She returns to clinic 6/29 with reports of ongoing shortness of breath.  She reports she saw Cardiology in May for shortness of breath.  This was following a viral illness, then COVID early in the year. She has not gotten back to her baseline activity level. Cardiology recommended her to continue PT efforts. Unfortunately, the patient has not been able to consistently work with PT.  The patient reports she rarely has episodes of AF (is aware of them when they occur) and they are very brief in nature.  Her weight is up 4lbs since her last Cardiology visit.  She notes she has had LE swelling but no more than usual.  Over the last few months, she has had a decrease in her activity tolerance - found it difficult to walk into the clinic, getting into the grocery store without taking a break.  Denies chest pain, pain with inspiration, lightheadedness, syncope.    Her most recent sr cr was 1.11 (06/11/22) with a creatinine clearance of 32 (based on 126lbs).    ROS: Negative unless mentioned in HPI.   EP Information / Studies Reviewed: .    Studies 06/2015 EP Study > PVI ablation, including Mitral isthmus and Cavo-tricuspid isthmus ablation noting Multiple atypical atrial flutter circuits observed too unstable for mapping. Has had an ATach 04/2020 TTE > LVEF 60-65%, Grade III diastolic dysfunction (restrictive), elevated  LA pressure, RV systolic function normal, mildly elevated RVSP at 35.7, LA moderately dilated, RA mildly dilated, mild-mod TR, moderate AVR, mild aortic valve sclerosis present w/o evidence of stenosis  09/2020 Stress Myoview > no ST segment deviation noted during stress, normal study, low risk study, no gated  Arrhythmia / Device  Atrial Fibrillation, Bradycardia  12/2008 > MDT-Carelink dual chamber PPM implanted 08/2019 Gen change  07/28/22 Device Interrogation > A/V paced, frequent PAC's. However, when pacing at 40 no P or R waves. AF burden 1.9%, EGM's reviewed, mostly frequent PAC's though true AF noted. Changed atrial output to 2.5V for 2:1 safety margin   AAD / Anticoagulation  2012 Amiodarone > stopped due to abnormal LFT's  2022 Flecainide   Risk Assessment/Calculations:    CHA2DS2-VASc Score = 6   This indicates a 9.7% annual risk of stroke. The patient's score is based upon: CHF History: 1 HTN History: 1 Diabetes History: 0 Stroke History: 0 Vascular Disease History: 1 Age Score: 2 Gender Score: 1            Physical Exam:   VS:  BP (!) 106/58   Pulse 62   Ht 5' (1.524 m)   Wt 126 lb (57.2 kg)   SpO2 98%   BMI 24.61 kg/m    Wt Readings from Last 3 Encounters:  07/28/22 126 lb (57.2 kg)  07/09/22 122 lb (55.3 kg)  07/09/22 123 lb 4 oz (55.9 kg)  GEN: Well nourished, well developed in no acute distress NECK: No JVD; No carotid bruits CARDIAC: s1s2 RRR, no murmurs, rubs, gallops. R sided implantation site well healed without edema, erythema or tethering RESPIRATORY:  Clear to auscultation without rales, wheezing or rhonchi  ABDOMEN: Soft, non-tender, non-distended EXTREMITIES:  No edema; No deformity   ASSESSMENT AND PLAN: .    SSS s/p PPM  -intact function, programming changes made to atrial output to 2.5V for 2:1 safety margin   Paroxysmal AF Atrial Tachycardia  High Risk Medication Use CHA2DSvasc 6 -continue xarelto 15mg , appropriate dosing  confirmed  -continue flecainide, intervals normal on EKG  -continue diltiazem, toprol  -Sr Cr 1.11 on 06/11/22, baseline ~0.7-1.1, cr clearance 81mL/min based on last cr  Secondary Hypercoagulable State  -continue Xarelto as above  HTN  -BP controlled  Chronic Diastolic Heart Failure  CAD -GDMT > toprol, torsemide, toprol -pravastatin -increase torsemide to 40 mg on Tu/Th and follow up with Cardiology in one month for repeat BMP and further adjustment of as appropriate.  Previously was on 40mg  every other day and was decreased to 20 every day with renal issues.  -note weight up 4lbs.  -update ECHO   Dyspnea  Decreased Activity Tolerance  -diuretics as above  -assess CXR to r/o new parenchymal disease -as above   Dispo: Follow up in 1 month with Cardiology, 6 months with EP APP  Signed, Canary Brim, MSN, APRN, NP-C, AGACNP-BC [facility] - Electrophysiology  07/28/2022, 4:15 PM

## 2022-07-28 ENCOUNTER — Ambulatory Visit: Payer: Medicare Other | Attending: Physician Assistant | Admitting: Pulmonary Disease

## 2022-07-28 ENCOUNTER — Other Ambulatory Visit (HOSPITAL_BASED_OUTPATIENT_CLINIC_OR_DEPARTMENT_OTHER): Payer: Self-pay

## 2022-07-28 ENCOUNTER — Encounter: Payer: Self-pay | Admitting: Pulmonary Disease

## 2022-07-28 VITALS — BP 106/58 | HR 62 | Ht 60.0 in | Wt 126.0 lb

## 2022-07-28 DIAGNOSIS — I25118 Atherosclerotic heart disease of native coronary artery with other forms of angina pectoris: Secondary | ICD-10-CM

## 2022-07-28 DIAGNOSIS — I5032 Chronic diastolic (congestive) heart failure: Secondary | ICD-10-CM | POA: Diagnosis not present

## 2022-07-28 DIAGNOSIS — I482 Chronic atrial fibrillation, unspecified: Secondary | ICD-10-CM

## 2022-07-28 DIAGNOSIS — R0600 Dyspnea, unspecified: Secondary | ICD-10-CM

## 2022-07-28 DIAGNOSIS — I10 Essential (primary) hypertension: Secondary | ICD-10-CM

## 2022-07-28 LAB — CUP PACEART INCLINIC DEVICE CHECK
Battery Remaining Longevity: 96 mo
Battery Voltage: 3 V
Brady Statistic AP VP Percent: 87.84 %
Brady Statistic AP VS Percent: 0.12 %
Brady Statistic AS VP Percent: 0.78 %
Brady Statistic AS VS Percent: 11.26 %
Brady Statistic RA Percent Paced: 87.07 %
Brady Statistic RV Percent Paced: 87.6 %
Date Time Interrogation Session: 20240619180843
Implantable Lead Connection Status: 753985
Implantable Lead Connection Status: 753985
Implantable Lead Implant Date: 20101109
Implantable Lead Implant Date: 20101109
Implantable Lead Location: 753859
Implantable Lead Location: 753860
Implantable Lead Model: 4469
Implantable Lead Model: 4470
Implantable Lead Serial Number: 535369
Implantable Lead Serial Number: 657967
Implantable Pulse Generator Implant Date: 20210708
Lead Channel Impedance Value: 266 Ohm
Lead Channel Impedance Value: 380 Ohm
Lead Channel Impedance Value: 418 Ohm
Lead Channel Impedance Value: 437 Ohm
Lead Channel Pacing Threshold Amplitude: 0.75 V
Lead Channel Pacing Threshold Amplitude: 1 V
Lead Channel Pacing Threshold Pulse Width: 0.4 ms
Lead Channel Pacing Threshold Pulse Width: 0.4 ms
Lead Channel Sensing Intrinsic Amplitude: 0.25 mV
Lead Channel Sensing Intrinsic Amplitude: 0.25 mV
Lead Channel Sensing Intrinsic Amplitude: 3.75 mV
Lead Channel Sensing Intrinsic Amplitude: 3.75 mV
Lead Channel Setting Pacing Amplitude: 2.5 V
Lead Channel Setting Pacing Amplitude: 2.5 V
Lead Channel Setting Pacing Pulse Width: 0.4 ms
Lead Channel Setting Sensing Sensitivity: 1.2 mV
Zone Setting Status: 755011
Zone Setting Status: 755011

## 2022-07-28 NOTE — Patient Instructions (Addendum)
Medication Instructions:   ON TUESDAY AND THURSDAY ONLY : TAKE AN EXTRA TORSEMIDE TABLET     *If you need a refill on your cardiac medications before your next appointment, please call your pharmacy*   Lab Work: NONE ORDERED  TODAY    If you have labs (blood work) drawn today and your tests are completely normal, you will receive your results only by: MyChart Message (if you have MyChart) OR A paper copy in the mail If you have any lab test that is abnormal or we need to change your treatment, we will call you to review the results.   Testing/Procedures: Your physician has requested that you have an echocardiogram. Echocardiography is a painless test that uses sound waves to create images of your heart. It provides your doctor with information about the size and shape of your heart and how well your heart's chambers and valves are working. This procedure takes approximately one hour. There are no restrictions for this procedure. Please do NOT wear cologne, perfume, aftershave, or lotions (deodorant is allowed). Please arrive 15 minutes prior to your appointment time.    A chest x-ray takes a picture of the organs and structures inside the chest, including the heart, lungs, and blood vessels. This test can show several things, including, whether the heart is enlarges; whether fluid is building up in the lungs; and whether pacemaker / defibrillator leads are still in place.  Address: [address] [place], [place], [place] [address] Phone: [phone] Hours:  Monday 6:30?AM-7?PM Tuesday 6:30?AM-7?PM Wednesday 6:30?AM-7?PM Thursday 6:30?AM-7?PM Friday 6:30?AM-7?PM Saturday 6:30?AM-7?PM Sunday 6:30?AM-7?PM   Follow-Up: At [facility], you and your health needs are our priority.  As part of our continuing mission to provide you with exceptional heart care, we have created designated Provider Care Teams.  These Care Teams include your primary Cardiologist (physician) and Advanced  Practice Providers (APPs -  Physician Assistants and Nurse Practitioners) who all work together to provide you with the care you need, when you need it.  We recommend signing up for the patient portal called "MyChart".  Sign up information is provided on this After Visit Summary.  MyChart is used to connect with patients for Virtual Visits (Telemedicine).  Patients are able to view lab/test results, encounter notes, upcoming appointments, etc.  Non-urgent messages can be sent to your provider as well.   To learn more about what you can do with MyChart, go to [email or web address].    Your next appointment:  CAITLYN W. PA-C  IN 1 MONTH    6 month(s)  Provider:    You may see Maurice Small, MD or one of the following Advanced Practice Providers on your designated Care Team:   Francis Dowse, [place] Casimiro Needle "Mardelle Matte" [place], [place] Sherie Don, NP    Other Instructions

## 2022-07-29 DIAGNOSIS — N289 Disorder of kidney and ureter, unspecified: Secondary | ICD-10-CM | POA: Diagnosis not present

## 2022-07-30 ENCOUNTER — Ambulatory Visit
Admission: RE | Admit: 2022-07-30 | Discharge: 2022-07-30 | Disposition: A | Discharge status: Home / Self Care | Payer: Medicare Other | Source: Ambulatory Visit | Attending: Pulmonary Disease | Admitting: Pulmonary Disease

## 2022-07-30 DIAGNOSIS — R0600 Dyspnea, unspecified: Secondary | ICD-10-CM

## 2022-07-30 DIAGNOSIS — R0602 Shortness of breath: Secondary | ICD-10-CM | POA: Diagnosis not present

## 2022-08-02 ENCOUNTER — Other Ambulatory Visit: Payer: Self-pay

## 2022-08-02 ENCOUNTER — Other Ambulatory Visit (HOSPITAL_BASED_OUTPATIENT_CLINIC_OR_DEPARTMENT_OTHER): Payer: Self-pay

## 2022-08-03 ENCOUNTER — Other Ambulatory Visit: Payer: Self-pay | Admitting: Internal Medicine

## 2022-08-03 ENCOUNTER — Telehealth: Payer: Self-pay | Admitting: Cardiovascular Disease

## 2022-08-03 DIAGNOSIS — R944 Abnormal results of kidney function studies: Secondary | ICD-10-CM

## 2022-08-03 NOTE — Telephone Encounter (Signed)
Pt aware CXR has not been read yet. She reports continued SOB, sleeping on 2 pillows, and still SOB. Discussed/reviewed w/ Otilio Saber PA and Roslynn Amble, NP. Pt aware we are still waiting on radiology read/interpretation but after reviewing there was nothing big that was seen on preliminary reading.  Pt advised to take her extra Torsemide today as usual/scheduled, but to take an extra dose tomorrow, per Mardelle Matte. Informed that once CXR is read by radiologist office will let her know findings. Patient verbalized understanding and agreeable to plan.

## 2022-08-03 NOTE — Telephone Encounter (Signed)
Pt is calling regarding her results from xray on 07/30/2022 and is requesting a callback. Please advise

## 2022-08-05 DIAGNOSIS — I110 Hypertensive heart disease with heart failure: Secondary | ICD-10-CM | POA: Diagnosis not present

## 2022-08-05 DIAGNOSIS — H6123 Impacted cerumen, bilateral: Secondary | ICD-10-CM | POA: Diagnosis not present

## 2022-08-06 ENCOUNTER — Encounter: Payer: Self-pay | Admitting: Cardiovascular Disease

## 2022-08-06 ENCOUNTER — Ambulatory Visit
Admission: RE | Admit: 2022-08-06 | Discharge: 2022-08-06 | Disposition: A | Discharge status: Home / Self Care | Payer: Medicare Other | Source: Ambulatory Visit | Attending: Internal Medicine | Admitting: Internal Medicine

## 2022-08-06 DIAGNOSIS — R944 Abnormal results of kidney function studies: Secondary | ICD-10-CM

## 2022-08-10 ENCOUNTER — Other Ambulatory Visit (HOSPITAL_COMMUNITY): Payer: Self-pay

## 2022-08-10 ENCOUNTER — Other Ambulatory Visit (HOSPITAL_BASED_OUTPATIENT_CLINIC_OR_DEPARTMENT_OTHER): Payer: Self-pay

## 2022-08-10 ENCOUNTER — Encounter (HOSPITAL_BASED_OUTPATIENT_CLINIC_OR_DEPARTMENT_OTHER): Payer: Self-pay

## 2022-08-11 ENCOUNTER — Other Ambulatory Visit (HOSPITAL_BASED_OUTPATIENT_CLINIC_OR_DEPARTMENT_OTHER): Payer: Self-pay

## 2022-08-13 ENCOUNTER — Ambulatory Visit (INDEPENDENT_AMBULATORY_CARE_PROVIDER_SITE_OTHER): Payer: Medicare Other

## 2022-08-13 DIAGNOSIS — M79672 Pain in left foot: Secondary | ICD-10-CM | POA: Diagnosis not present

## 2022-08-13 DIAGNOSIS — I495 Sick sinus syndrome: Secondary | ICD-10-CM

## 2022-08-13 DIAGNOSIS — M19072 Primary osteoarthritis, left ankle and foot: Secondary | ICD-10-CM | POA: Diagnosis not present

## 2022-08-14 LAB — CUP PACEART REMOTE DEVICE CHECK
Battery Remaining Longevity: 91 mo
Battery Voltage: 2.99 V
Brady Statistic AP VP Percent: 98.55 %
Brady Statistic AP VS Percent: 0.05 %
Brady Statistic AS VP Percent: 0.31 %
Brady Statistic AS VS Percent: 1.08 %
Brady Statistic RA Percent Paced: 98.63 %
Brady Statistic RV Percent Paced: 98.86 %
Date Time Interrogation Session: 20240705053347
Implantable Lead Connection Status: 753985
Implantable Lead Connection Status: 753985
Implantable Lead Implant Date: 20101109
Implantable Lead Implant Date: 20101109
Implantable Lead Location: 753859
Implantable Lead Location: 753860
Implantable Lead Model: 4469
Implantable Lead Model: 4470
Implantable Lead Serial Number: 535369
Implantable Lead Serial Number: 657967
Implantable Pulse Generator Implant Date: 20210708
Lead Channel Impedance Value: 247 Ohm
Lead Channel Impedance Value: 342 Ohm
Lead Channel Impedance Value: 380 Ohm
Lead Channel Impedance Value: 380 Ohm
Lead Channel Pacing Threshold Amplitude: 0.625 V
Lead Channel Pacing Threshold Amplitude: 1 V
Lead Channel Pacing Threshold Pulse Width: 0.4 ms
Lead Channel Pacing Threshold Pulse Width: 0.4 ms
Lead Channel Sensing Intrinsic Amplitude: 0.125 mV
Lead Channel Sensing Intrinsic Amplitude: 0.125 mV
Lead Channel Sensing Intrinsic Amplitude: 3.5 mV
Lead Channel Sensing Intrinsic Amplitude: 3.5 mV
Lead Channel Setting Pacing Amplitude: 2.5 V
Lead Channel Setting Pacing Amplitude: 2.5 V
Lead Channel Setting Pacing Pulse Width: 0.4 ms
Lead Channel Setting Sensing Sensitivity: 1.2 mV
Zone Setting Status: 755011
Zone Setting Status: 755011

## 2022-08-15 ENCOUNTER — Other Ambulatory Visit (HOSPITAL_BASED_OUTPATIENT_CLINIC_OR_DEPARTMENT_OTHER): Payer: Self-pay

## 2022-08-16 ENCOUNTER — Ambulatory Visit (HOSPITAL_COMMUNITY): Payer: Medicare Other | Attending: Pulmonary Disease

## 2022-08-16 DIAGNOSIS — R0600 Dyspnea, unspecified: Secondary | ICD-10-CM | POA: Diagnosis not present

## 2022-08-16 LAB — ECHOCARDIOGRAM COMPLETE
MV M vel: 4.22 m/s
MV Peak grad: 71.2 mmHg
P 1/2 time: 351 msec
Radius: 0.9 cm
S' Lateral: 3.1 cm

## 2022-08-17 ENCOUNTER — Other Ambulatory Visit (HOSPITAL_BASED_OUTPATIENT_CLINIC_OR_DEPARTMENT_OTHER): Payer: Self-pay

## 2022-08-17 ENCOUNTER — Encounter (HOSPITAL_BASED_OUTPATIENT_CLINIC_OR_DEPARTMENT_OTHER): Payer: Self-pay

## 2022-08-17 ENCOUNTER — Other Ambulatory Visit (HOSPITAL_BASED_OUTPATIENT_CLINIC_OR_DEPARTMENT_OTHER): Payer: Self-pay | Admitting: Cardiovascular Disease

## 2022-08-17 DIAGNOSIS — I480 Paroxysmal atrial fibrillation: Secondary | ICD-10-CM

## 2022-08-17 MED ORDER — DILTIAZEM HCL ER COATED BEADS 360 MG PO CP24
360.0000 mg | ORAL_CAPSULE | Freq: Every day | ORAL | 2 refills | Status: DC
Start: 2022-08-17 — End: 2023-05-12
  Filled 2022-08-17: qty 90, 90d supply, fill #0
  Filled 2022-11-17: qty 90, 90d supply, fill #1
  Filled 2023-02-15: qty 90, 90d supply, fill #2

## 2022-08-17 NOTE — Telephone Encounter (Signed)
Rx(s) sent to pharmacy electronically.  

## 2022-08-18 ENCOUNTER — Other Ambulatory Visit (HOSPITAL_BASED_OUTPATIENT_CLINIC_OR_DEPARTMENT_OTHER): Payer: Self-pay

## 2022-08-23 ENCOUNTER — Ambulatory Visit (HOSPITAL_BASED_OUTPATIENT_CLINIC_OR_DEPARTMENT_OTHER): Payer: Medicare Other | Admitting: Family

## 2022-08-23 ENCOUNTER — Encounter (HOSPITAL_BASED_OUTPATIENT_CLINIC_OR_DEPARTMENT_OTHER): Payer: Self-pay | Admitting: Family

## 2022-08-23 ENCOUNTER — Other Ambulatory Visit (HOSPITAL_BASED_OUTPATIENT_CLINIC_OR_DEPARTMENT_OTHER): Payer: Self-pay

## 2022-08-23 VITALS — BP 108/67 | HR 69 | Ht 60.0 in | Wt 127.5 lb

## 2022-08-23 DIAGNOSIS — R5381 Other malaise: Secondary | ICD-10-CM

## 2022-08-23 DIAGNOSIS — I480 Paroxysmal atrial fibrillation: Secondary | ICD-10-CM | POA: Diagnosis not present

## 2022-08-23 DIAGNOSIS — I10 Essential (primary) hypertension: Secondary | ICD-10-CM

## 2022-08-23 DIAGNOSIS — I071 Rheumatic tricuspid insufficiency: Secondary | ICD-10-CM

## 2022-08-23 DIAGNOSIS — I5032 Chronic diastolic (congestive) heart failure: Secondary | ICD-10-CM

## 2022-08-23 DIAGNOSIS — D6859 Other primary thrombophilia: Secondary | ICD-10-CM

## 2022-08-23 MED ORDER — DAPAGLIFLOZIN PROPANEDIOL 10 MG PO TABS
10.0000 mg | ORAL_TABLET | Freq: Every day | ORAL | 5 refills | Status: DC
Start: 2022-08-23 — End: 2023-02-24
  Filled 2022-08-23 – 2022-08-28 (×4): qty 30, 30d supply, fill #0
  Filled 2022-09-27 (×2): qty 30, 30d supply, fill #1
  Filled 2022-10-23 (×2): qty 30, 30d supply, fill #2
  Filled 2022-11-23: qty 30, 30d supply, fill #3
  Filled 2022-12-24: qty 30, 30d supply, fill #4
  Filled 2023-01-21: qty 30, 30d supply, fill #5

## 2022-08-23 MED ORDER — DAPAGLIFLOZIN PROPANEDIOL 10 MG PO TABS: 10.0000 mg | ORAL_TABLET | Freq: Every day | ORAL | Status: DC

## 2022-08-23 NOTE — Progress Notes (Signed)
Cardiology Office Note:  .   Date:  08/23/2022  ID:  [name], DOB 10-21-1933, MRN [id number] PCP: Chilton Greathouse, MD  [facility] Providers Cardiologist:  Chilton Si, MD Electrophysiologist:  Maurice Small, MD    History of Present Illness: .   Kathryn Gibson is a 89 y.o. female with a hx of atrial fibrillation s/p ablation, SSS s/p PPM, coronary calcification, HTN, exertional dyspnea, diastolic heart failure last seen 07/28/22 by Canary Brim, NP.   Prior patient of Dr. Patty Sermons. Underwent ablation for atrial fib 07/01/15. Recurrent episode of atrial fib 12/2015. Amiodarone had to be discontinued due to elevated LFTs which subsequently normalized. Metoprolol later increased for palpitations. Nuclear stress test 09/2020 normal.    Seen 04/14/22. She had viral infection 02/2022 and COVID thereafter associated with cough, shortness of breath. She had resumed her BP regimen as had held during infection due to hypotension. CXR showed emphysema without evidence of acute cardiopulmonary disease.    Since last seen multiple MyChart messages regarding edema, shortness of breath, renal function, and diuretic dosing. She was recommended for in office visit.    Multiple MyChart messages were sent regarding edema, shortness of breath, diuretic dosing interesting in follow-up 06/21/2022.  She was euvolemic and her dyspnea was overall intermittent.  Recommended continue torsemide 20 mg daily and platelet declined lab work as she had upcoming visit with PCP.  She was seen 07/28/2022 by EP team with ongoing shortness of breath.  Her torsemide was increased to 40 mg on T/Th with 20mg  all other days. CXR with minimal hazy blunting of costophrenic angles with possible small amount of bibasilar pleural fluid/atelectasis. Echo 08/16/22 LVEF 55-60%, gr2DD, severe TR, moderately elevated PASP. Likely some component of pulmonary hypertension.    Pleasant lady who resides at [facility]. BP at home  110-120s at home. Since last seen she has transitioned to using a Ellise Kovack which has improved her ambulation but she is frustrated by her persistent dyspnea. We reviewed her echocardiogram in detail. She reports her swelling, breathing, exercise tolerance are all getting worse. She does still have intermittently good days. Most recently last Thursday. She is wearing compression stockings intermittently which do seem to help.   She notes some sensation of "pressure" in her head. She is worried about her sinuses. She does not intermittent allergy and sinus issues. She has been taking her Claritin only intermittently. Encouraged her to put it in her pill pack.   ROS: Please see the history of present illness.    All other systems reviewed and are negative.   Studies Reviewed: .        Cardiac Studies & Procedures     STRESS TESTS  MYOCARDIAL PERFUSION IMAGING 09/26/2020  Narrative  There was no ST segment deviation noted during stress.  The study is normal. Negative for ischemia.  This is a low risk study.  This study was not gated.   ECHOCARDIOGRAM  ECHOCARDIOGRAM COMPLETE 08/16/2022  Narrative ECHOCARDIOGRAM REPORT    Patient Name:   Kathryn Gibson Date of Exam: 08/16/2022 Medical Rec #:  [id number]       Height:       60.0 in Accession #:    [id number]      Weight:       126.0 lb Date of Birth:  02-27-1933       BSA:          1.534 m Patient Age:    63 years  BP:           106/58 mmHg Patient Gender: F               HR:           68 bpm. Exam Location:  [address]  Procedure: 2D Echo, Cardiac Doppler, Color Doppler and 3D Echo  Indications:    Dyspnea R06.00  History:        Patient has prior history of Echocardiogram examinations, most recent 04/11/2020. CAD, Pacemaker, Arrythmias:Atrial Fibrillation; Risk Factors:Hypertension and Dyslipidemia.  Sonographer:    Thurman Coyer RDCS Referring Phys: [id number] Luetta Nutting OLLIS  IMPRESSIONS   1. Compared with the  echo 04/2020, tricuspid regurgitation has increased from mild/moderate to severe. 2. Left ventricular ejection fraction, by estimation, is 55 to 60%. The left ventricle has normal function. The left ventricle has no regional wall motion abnormalities. There is mild concentric left ventricular hypertrophy. Left ventricular diastolic parameters are consistent with Grade II diastolic dysfunction (pseudonormalization). 3. Right ventricular systolic function is normal. The right ventricular size is normal. There is moderately elevated pulmonary artery systolic pressure. 4. Left atrial size was severely dilated. 5. Right atrial size was severely dilated. 6. Mitral valve regurgitant volume 45 mL. The mitral valve is normal in structure. Moderate mitral valve regurgitation. No evidence of mitral stenosis. 7. Tricuspid valve regurgitation is severe. 8. The aortic valve is tricuspid. Aortic valve regurgitation is moderate. No aortic stenosis is present. Aortic regurgitation PHT measures 351 msec. 9. There is mild (Grade II) atheroma plaque involving the ascending aorta. 10. The inferior vena cava is dilated in size with >50% respiratory variability, suggesting right atrial pressure of 8 mmHg.  FINDINGS Left Ventricle: Left ventricular ejection fraction, by estimation, is 55 to 60%. The left ventricle has normal function. The left ventricle has no regional wall motion abnormalities. The left ventricular internal cavity size was normal in size. There is mild concentric left ventricular hypertrophy. Left ventricular diastolic parameters are consistent with Grade II diastolic dysfunction (pseudonormalization).  Right Ventricle: The right ventricular size is normal. No increase in right ventricular wall thickness. Right ventricular systolic function is normal. There is moderately elevated pulmonary artery systolic pressure. The tricuspid regurgitant velocity is 3.45 m/s, and with an assumed right atrial pressure of  8 mmHg, the estimated right ventricular systolic pressure is 55.6 mmHg.  Left Atrium: Left atrial size was severely dilated.  Right Atrium: Right atrial size was severely dilated.  Pericardium: There is no evidence of pericardial effusion.  Mitral Valve: Mitral valve regurgitant volume 45 mL. The mitral valve is normal in structure. There is mild thickening of the mitral valve leaflet(s). Moderate mitral valve regurgitation. No evidence of mitral valve stenosis.  Tricuspid Valve: The tricuspid valve is normal in structure. Tricuspid valve regurgitation is severe. No evidence of tricuspid stenosis.  Aortic Valve: The aortic valve is tricuspid. Aortic valve regurgitation is moderate. Aortic regurgitation PHT measures 351 msec. No aortic stenosis is present.  Pulmonic Valve: The pulmonic valve was normal in structure. Pulmonic valve regurgitation is mild. No evidence of pulmonic stenosis.  Aorta: The aortic root is normal in size and structure. There is mild (Grade II) atheroma plaque involving the ascending aorta.  Venous: The inferior vena cava is dilated in size with greater than 50% respiratory variability, suggesting right atrial pressure of 8 mmHg.  IAS/Shunts: No atrial level shunt detected by color flow Doppler.  Additional Comments: A device lead is visualized.   LEFT VENTRICLE PLAX 2D  LVIDd:         4.70 cm LVIDs:         3.10 cm LV PW:         1.10 cm LV IVS:        1.10 cm LVOT diam:     2.00 cm   3D Volume EF: LV SV:         53        3D EF:        56 % LV SV Index:   35        LV EDV:       122 ml LVOT Area:     3.14 cm  LV ESV:       54 ml LV SV:        68 ml  RIGHT VENTRICLE RV Basal diam:  4.00 cm RV Mid diam:    3.30 cm RV S prime:     9.30 cm/s TAPSE (M-mode): 1.6 cm  LEFT ATRIUM              Index        RIGHT ATRIUM           Index LA diam:        5.30 cm  3.46 cm/m   RA Area:     34.00 cm LA Vol (A2C):   131.0 ml 85.41 ml/m  RA Volume:   124.00 ml  80.84 ml/m LA Vol (A4C):   91.1 ml  59.39 ml/m LA Biplane Vol: 112.0 ml 73.02 ml/m AORTIC VALVE LVOT Vmax:   71.70 cm/s LVOT Vmean:  49.600 cm/s LVOT VTI:    0.169 m AI PHT:      351 msec  AORTA Ao Root diam: 2.70 cm Ao Asc diam:  3.40 cm  MR Peak grad:    71.2 mmHg    TRICUSPID VALVE MR Mean grad:    47.0 mmHg    TR Peak grad:   47.6 mmHg MR Vmax:         422.00 cm/s  TR Vmax:        345.00 cm/s MR Vmean:        324.0 cm/s MR PISA:         5.09 cm     SHUNTS MR PISA Eff ROA: 38 mm       Systemic VTI:  0.17 m MR PISA Radius:  0.90 cm      Systemic Diam: 2.00 cm  Chilton Si MD Electronically signed by Chilton Si MD Signature Date/Time: 08/16/2022/3:57:51 PM    Final     CT SCANS  CT CORONARY MORPH W/CTA COR W/SCORE 06/26/2015  Addendum 06/26/2015  3:10 PM ADDENDUM REPORT: 06/26/2015 15:07 CLINICAL DATA:  Pre Ablation EXAM: Cardiac CTA MEDICATIONS: None TECHNIQUE: The patient was scanned on a Philips 256 slice scanner. Gantry rotation speed was 270 msecs. Collimation was .9mm. A 100 kV prospective scan was triggered in the descending thoracic aorta at 111 HU's with 5% padding centered around 78% of the R-R interval. Average HR during the scan was 65 bpm. The 3D data set was interpreted on a dedicated work station using MPR, MIP and VRT modes. A total of 80cc of contrast was used. FINDINGS: Non-cardiac: See separate report from [facility]. No significant findings on limited lung and soft tissue windows. Calcium Score:  188 with calcium in circumflex and LAD Severe biatrial enlargement. Pacing system in RA/RV. No ASD or pericardial effusion. LAA is enlarged with likely  thrombus formation in the mid body and apex. 4 pulmonary Veins drain normally into LA with no anomaly. No pericardial effusion. Esophagus courses behind LLPV RSPV:  Ostium 20 mm Area 3.9 cm2 RIPV:  Ostium 18 mm Area 2.4 cm2 LUPV:  Ostium 16 mm Area 2.6 cm2 LLPV:  Ostium 20  mm Area 2.4 cm2 IMPRESSION: 1) Severe biatrial enlargement with pacemaker wires in RA/RV 2) Dilated LAA with probably thrombus in mid body and apex 3) Normal pulmonary vein anatomy with no anomaly see measurements above 4) No pericardial effusion 5) No ASD 6) Esophagus courses behind LLPV 7) Calcium score 188 66th percentile for age and sex matched controls Charlton Haws Electronically Signed By: Charlton Haws M.D. On: 06/26/2015 15:07  Narrative EXAM: OVER-READ INTERPRETATION  CT CHEST  The following report is an over-read performed by radiologist Dr. Royal Piedra [facility] on 06/26/2015. This over-read does not include interpretation of cardiac or coronary anatomy or pathology. The coronary calcium score/coronary CTA interpretation by the cardiologist is attached.  COMPARISON:  No priors.  FINDINGS: Within the visualized portions of the thorax there are no suspicious appearing pulmonary nodules or masses, there is no acute consolidative airspace disease, no pleural effusions, no pneumothorax and no lymphadenopathy. Tiny calcified granuloma in the lateral segment of the right middle lobe incidentally noted. Visualized portions of the upper abdomen are unremarkable. There are no aggressive appearing lytic or blastic lesions noted in the visualized portions of the skeleton.  IMPRESSION: 1. No significant incidental noncardiac findings noted.  Electronically Signed: By: Trudie Reed M.D. On: 06/26/2015 14:08          Risk Assessment/Calculations:    CHA2DS2-VASc Score = 6   This indicates a 9.7% annual risk of stroke. The patient's score is based upon: CHF History: 1 HTN History: 1 Diabetes History: 0 Stroke History: 0 Vascular Disease History: 1 Age Score: 2 Gender Score: 1            Physical Exam:   VS:  BP 108/67 (BP Location: Left Arm, Patient Position: Sitting, Cuff Size: Normal)   Pulse 69   Ht 5' (1.524 m)   Wt 127 lb 8 oz  (57.8 kg)   SpO2 94%   BMI 24.90 kg/m    Wt Readings from Last 3 Encounters:  08/23/22 127 lb 8 oz (57.8 kg)  07/28/22 126 lb (57.2 kg)  07/09/22 122 lb (55.3 kg)    GEN: Well nourished, well developed in no acute distress NECK: No JVD; No carotid bruits CARDIAC: RRR, no murmurs, rubs, gallops RESPIRATORY:  Clear to auscultation without rales, wheezing or rhonchi  ABDOMEN: Soft, non-tender, non-distended EXTREMITIES:  No edema; No deformity   ASSESSMENT AND PLAN: .    HTN - BP well controlled. Continue current antihypertensive regimen.  Relative hypotension without lightheadedness nor dizziness.    Chronic diastolic heart failure -NYHA II-III with dyspnea. Likely compounded with physical deconditioning. Refer to outpatient physical therapy. Rx Farxiga 10mg  every day. BMP in 1 week. One week samples provided in clinic. Additional GDMT Toprol, Torsemide. Continue Torsemide 40mg  on T/Th with 20mg  all other days. Future considerations include Spironolactone. She requests to discuss with PCP team tomorrow prior to starting [place]. Reassurance provided that this would help her heart and prevent kidney disease.   CAD - nonobstructive by prior imaging. GDMT Toprol, Pravastatin, fish oil. Heart healthy diet and regular cardiovascular exercise encouraged.    Atrial fibrillation / Hypercoagulable state / High risk medications use - Continue  Toprol 75mg  BID, Flecainide 50mg  BID, Diltiazem 360mg  QD. No recent palpitations. CHA2DS2-VASc Score = 6 [CHF History: 1, HTN History: 1, Diabetes History: 0, Stroke History: 0, Vascular Disease History: 1, Age Score: 2, Gender Score: 1].  Therefore, the patient's annual risk of stroke is 9.7 %.    Continue Xarelto 15mg  QD. Denies bleeding complications         Dispo: follow up in September as scheduled with Dr. Duke Salvia or APP  Signed, Alver Sorrow, NP

## 2022-08-23 NOTE — Patient Instructions (Signed)
Medication Instructions:  Your physician has recommended you make the following change in your medication:    START Dapagliflozin Marcelline Deist) 10mg  daily This helps to treat heart failure, prevent excess fluid from building up, prevent kidney disease, and reduce your cardiovascular risk.   CONTINUE Torsemide 40mg  on Tuesday/Thursday and 20mg  every other day  Another medicine we may consider adding in the future to help your heart to work more efficiently is Spironolactone (Aldactone).   *If you need a refill on your cardiac medications before your next appointment, please call your pharmacy*   Lab Work: Your physician recommends that you return for lab work in 1 week for [facility]  Please return for Lab work. You may come to the...   [facility] (3rd floor) [address], [place], [place] [address]  Open: 8am-Noon and 1pm-4:30pm  Please ring the doorbell on the small table when you exit the elevator and the Lab Tech will come get you  [facility] at [facility] [address], [place], [place] [address] Open: 8am-1pm, then 2pm-4:30pm   [facility]- Please see attached locations sheet stapled to your lab work with address and hours.    If you have labs (blood work) drawn today and your tests are completely normal, you will receive your results only by: MyChart Message (if you have MyChart) OR A paper copy in the mail If you have any lab test that is abnormal or we need to change your treatment, we will call you to review the results.   Testing/Procedures:  Your echocardiogram showed your heart muscle is pumping strong, but it does not relax. It also showed your tricuspid valve was more leaky than before and there was pulmonary hypertension (elevated pressures in your lungs). All of these can make your breathing worse and cause more swelling.   Follow-Up: At [facility], you and your health needs are our priority.  As part of our  continuing mission to provide you with exceptional heart care, we have created designated Provider Care Teams.  These Care Teams include your primary Cardiologist (physician) and Advanced Practice Providers (APPs -  Physician Assistants and Nurse Practitioners) who all work together to provide you with the care you need, when you need it.  We recommend signing up for the patient portal called "MyChart".  Sign up information is provided on this After Visit Summary.  MyChart is used to connect with patients for Virtual Visits (Telemedicine).  Patients are able to view lab/test results, encounter notes, upcoming appointments, etc.  Non-urgent messages can be sent to your provider as well.   To learn more about what you can do with MyChart, go to [email or web address].    Your next appointment:   In September as scheduled with Dr. Duke Salvia  Other Instructions We have referred you to physical therapy at [facility]. If you  not hear from them in a week you may call them at [phone] to check on your referral

## 2022-08-24 DIAGNOSIS — M8589 Other specified disorders of bone density and structure, multiple sites: Secondary | ICD-10-CM | POA: Diagnosis not present

## 2022-08-24 DIAGNOSIS — K5900 Constipation, unspecified: Secondary | ICD-10-CM

## 2022-08-25 NOTE — Telephone Encounter (Signed)
2 view x-ray order in epic.

## 2022-08-26 ENCOUNTER — Ambulatory Visit (INDEPENDENT_AMBULATORY_CARE_PROVIDER_SITE_OTHER)
Admission: RE | Admit: 2022-08-26 | Discharge: 2022-08-26 | Disposition: A | Discharge status: Home / Self Care | Payer: Medicare Other | Source: Ambulatory Visit | Attending: Physician Assistant | Admitting: Physician Assistant

## 2022-08-26 DIAGNOSIS — K529 Noninfective gastroenteritis and colitis, unspecified: Secondary | ICD-10-CM | POA: Diagnosis not present

## 2022-08-26 DIAGNOSIS — K5900 Constipation, unspecified: Secondary | ICD-10-CM

## 2022-08-26 DIAGNOSIS — R140 Abdominal distension (gaseous): Secondary | ICD-10-CM | POA: Diagnosis not present

## 2022-08-27 ENCOUNTER — Other Ambulatory Visit (HOSPITAL_BASED_OUTPATIENT_CLINIC_OR_DEPARTMENT_OTHER): Payer: Self-pay

## 2022-08-28 ENCOUNTER — Other Ambulatory Visit (HOSPITAL_BASED_OUTPATIENT_CLINIC_OR_DEPARTMENT_OTHER): Payer: Self-pay

## 2022-08-29 ENCOUNTER — Other Ambulatory Visit (HOSPITAL_COMMUNITY): Payer: Self-pay

## 2022-08-30 ENCOUNTER — Other Ambulatory Visit (HOSPITAL_COMMUNITY): Payer: Self-pay

## 2022-08-30 ENCOUNTER — Encounter (HOSPITAL_BASED_OUTPATIENT_CLINIC_OR_DEPARTMENT_OTHER): Payer: Self-pay | Admitting: Cardiovascular Disease

## 2022-08-30 ENCOUNTER — Ambulatory Visit (HOSPITAL_BASED_OUTPATIENT_CLINIC_OR_DEPARTMENT_OTHER): Payer: Medicare Other | Admitting: Family

## 2022-08-30 DIAGNOSIS — R5381 Other malaise: Secondary | ICD-10-CM | POA: Diagnosis not present

## 2022-08-30 DIAGNOSIS — I071 Rheumatic tricuspid insufficiency: Secondary | ICD-10-CM | POA: Diagnosis not present

## 2022-08-30 DIAGNOSIS — I5032 Chronic diastolic (congestive) heart failure: Secondary | ICD-10-CM | POA: Diagnosis not present

## 2022-08-30 NOTE — Progress Notes (Signed)
Remote pacemaker transmission.   

## 2022-08-31 ENCOUNTER — Other Ambulatory Visit: Payer: Self-pay

## 2022-08-31 ENCOUNTER — Other Ambulatory Visit (HOSPITAL_BASED_OUTPATIENT_CLINIC_OR_DEPARTMENT_OTHER): Payer: Self-pay

## 2022-08-31 LAB — BASIC METABOLIC PANEL
BUN/Creatinine Ratio: 21 (ref 12–28)
BUN: 24 mg/dL (ref 8–27)
CO2: 26 mmol/L (ref 20–29)
Calcium: 9.3 mg/dL (ref 8.7–10.3)
Chloride: 96 mmol/L (ref 96–106)
Creatinine, Ser: 1.16 mg/dL — ABNORMAL HIGH (ref 0.57–1.00)
Glucose: 121 mg/dL — ABNORMAL HIGH (ref 70–99)
Potassium: 4 mmol/L (ref 3.5–5.2)
Sodium: 136 mmol/L (ref 134–144)
eGFR: 46 mL/min/{1.73_m2} — ABNORMAL LOW (ref >59)

## 2022-09-01 ENCOUNTER — Other Ambulatory Visit (HOSPITAL_COMMUNITY): Payer: Self-pay

## 2022-09-02 ENCOUNTER — Encounter (HOSPITAL_BASED_OUTPATIENT_CLINIC_OR_DEPARTMENT_OTHER): Payer: Self-pay

## 2022-09-03 ENCOUNTER — Other Ambulatory Visit (HOSPITAL_BASED_OUTPATIENT_CLINIC_OR_DEPARTMENT_OTHER): Payer: Self-pay | Admitting: Cardiovascular Disease

## 2022-09-03 ENCOUNTER — Other Ambulatory Visit (HOSPITAL_BASED_OUTPATIENT_CLINIC_OR_DEPARTMENT_OTHER): Payer: Self-pay

## 2022-09-03 DIAGNOSIS — I5032 Chronic diastolic (congestive) heart failure: Secondary | ICD-10-CM

## 2022-09-03 MED ORDER — TORSEMIDE 40 MG PO TABS
40.0000 mg | ORAL_TABLET | Freq: Every day | ORAL | 3 refills | Status: DC
Start: 2022-09-03 — End: 2022-09-23
  Filled 2022-09-03: qty 90, 90d supply, fill #0

## 2022-09-03 NOTE — Addendum Note (Signed)
Addended by: Marlene Lard on: 09/03/2022 11:07 AM   Modules accepted: Orders

## 2022-09-03 NOTE — Telephone Encounter (Signed)
Called and spoke to Kathryn Gibson.  Recommended that she increase torsemide to 40mg  daily.  Take the [place] as recommended by [facility]. Come for BMP and BNP on Wednesday.  Oneill Bais C. Duke Salvia, MD, [facility]

## 2022-09-03 NOTE — Telephone Encounter (Signed)
Per Dr. Duke Salvia,   "Called and spoke to Ms. Dunklee.  Recommended that she increase torsemide to 40mg  daily.  Take the [place] as recommended by [facility]. Come for BMP and BNP on Wednesday.   Tiffany C. Duke Salvia, MD, [facility]"

## 2022-09-03 NOTE — Telephone Encounter (Signed)
Torsemide 40 mg is non formulary per pt insurance. Is there an alternative we can send in?

## 2022-09-06 DIAGNOSIS — K219 Gastro-esophageal reflux disease without esophagitis: Secondary | ICD-10-CM

## 2022-09-06 DIAGNOSIS — R09A2 Foreign body sensation, throat: Secondary | ICD-10-CM

## 2022-09-07 NOTE — Telephone Encounter (Signed)
Barium esophagram order in epic. Secure staff message sent to radiology scheduling to contact patient to set up an appt. Pt notified via MyChart.

## 2022-09-07 NOTE — Addendum Note (Signed)
Addended by: Missy Sabins on: 09/07/2022 12:07 PM   Modules accepted: Orders

## 2022-09-08 DIAGNOSIS — I5032 Chronic diastolic (congestive) heart failure: Secondary | ICD-10-CM | POA: Diagnosis not present

## 2022-09-10 ENCOUNTER — Encounter (HOSPITAL_BASED_OUTPATIENT_CLINIC_OR_DEPARTMENT_OTHER): Payer: Self-pay | Admitting: Cardiovascular Disease

## 2022-09-10 NOTE — Telephone Encounter (Signed)
Please advise 

## 2022-09-10 NOTE — Telephone Encounter (Signed)
Patient responding to you  ?

## 2022-09-13 ENCOUNTER — Other Ambulatory Visit: Payer: Self-pay

## 2022-09-13 NOTE — Telephone Encounter (Signed)
FYI

## 2022-09-14 ENCOUNTER — Encounter (HOSPITAL_BASED_OUTPATIENT_CLINIC_OR_DEPARTMENT_OTHER): Payer: Self-pay | Admitting: Physical Therapy

## 2022-09-14 ENCOUNTER — Other Ambulatory Visit (HOSPITAL_BASED_OUTPATIENT_CLINIC_OR_DEPARTMENT_OTHER): Payer: Self-pay

## 2022-09-14 ENCOUNTER — Ambulatory Visit (HOSPITAL_BASED_OUTPATIENT_CLINIC_OR_DEPARTMENT_OTHER): Payer: Medicare Other | Attending: Internal Medicine | Admitting: Physical Therapy

## 2022-09-14 DIAGNOSIS — R2689 Other abnormalities of gait and mobility: Secondary | ICD-10-CM | POA: Insufficient documentation

## 2022-09-14 DIAGNOSIS — R5381 Other malaise: Secondary | ICD-10-CM | POA: Diagnosis not present

## 2022-09-14 DIAGNOSIS — M6281 Muscle weakness (generalized): Secondary | ICD-10-CM | POA: Diagnosis not present

## 2022-09-14 NOTE — Therapy (Signed)
OUTPATIENT PHYSICAL THERAPY LOWER EXTREMITY EVALUATION   Patient Name: Kathryn Gibson MRN: [id number] DOB:Jan 19, 1934, 89 y.o., female Today's Date: 09/14/2022  END OF SESSION:   Past Medical History:  Diagnosis Date   Arthritis    Atrial fibrillation (HCC)    Atypical chest pain 09/22/2020   Blood transfusion without reported diagnosis    2017   Bradycardia    s/p PPM   CAD in native artery 03/26/2021   Cataract    Chronic diastolic heart failure (HCC) 03/26/2021   Colonic polyp    adenomatous   Constipation    Diverticulosis    Hemorrhoids    Hyperlipidemia    Hypertension    Hypothyroidism    Irritable bowel syndrome (IBS)    Lower extremity edema 09/22/2020   Osteoporosis    osteopenia   Pacemaker 2010   UTI (urinary tract infection)    Past Surgical History:  Procedure Laterality Date   BREAST CYST ASPIRATION Left 1980s   COLONOSCOPY     ELECTROPHYSIOLOGIC STUDY N/A 07/01/2015   Procedure: Atrial Fibrillation Ablation;  Surgeon: Hillis Range, MD;  Location: [facility] INVASIVE CV LAB;  Service: Cardiovascular;  Laterality: N/A;   EYE SURGERY     heart ablation     for atrial fib   HEMATOMA EVACUATION Right 07/15/2014   Procedure: EVACUATION Right Groin Hematoma;  Surgeon: Larina Earthly, MD;  Location: [facility] OR;  Service: Vascular;  Laterality: Right;   NASAL ENDOSCOPY WITH EPISTAXIS CONTROL N/A 07/14/2014   Procedure: NASAL ENDOSCOPY WITH EPISTAXIS CONTROL;  Surgeon: Melvenia Beam, MD;  Location: [facility] OR;  Service: ENT;  Laterality: N/A;   NASAL ENDOSCOPY WITH EPISTAXIS CONTROL Bilateral 07/15/2014   Procedure: NASAL ENDOSCOPY WITH EPISTAXIS CONTROL;  Surgeon: Melvenia Beam, MD;  Location: [facility] OR;  Service: ENT;  Laterality: Bilateral;   PACEMAKER INSERTION  2010   implanted by Dr Deborah Chalk (MDT)   PPM GENERATOR CHANGEOUT N/A 08/16/2019   Procedure: PPM GENERATOR CHANGEOUT;  Surgeon: Hillis Range, MD;  Location: [facility] INVASIVE CV LAB;  Service: Cardiovascular;  Laterality: N/A;    RADIOLOGY WITH ANESTHESIA N/A 07/14/2014   Procedure: RADIOLOGY WITH ANESTHESIA;  Surgeon: Medication Radiologist, MD;  Location: [facility] OR;  Service: Radiology;  Laterality: N/A;   TEE WITHOUT CARDIOVERSION N/A 07/01/2015   Procedure: TRANSESOPHAGEAL ECHOCARDIOGRAM (TEE);  Surgeon: Quintella Reichert, MD;  Location: [facility] ENDOSCOPY;  Service: Cardiovascular;  Laterality: N/A;   Patient Active Problem List   Diagnosis Date Noted   Chronic diastolic heart failure (HCC) 03/26/2021   CAD in native artery 03/26/2021   Atypical chest pain 09/22/2020   Lower extremity edema 09/22/2020   Genital herpes simplex 09/18/2019   Fatigue 06/08/2018   Ozena 12/14/2016   Rectal pain 11/12/2016   Ptosis of eyelid 04/05/2016   Hypokalemia    Acute blood loss anemia    Hypomagnesemia    Chronic atrial fibrillation (HCC)    Essential hypertension    Other specified hypothyroidism    Posterior epistaxis    Hematoma complicating a procedure    Diastolic dysfunction 07/14/2014   Epistaxis, recurrent 07/14/2014   Sick sinus syndrome (HCC) 04/29/2014   Encounter for therapeutic drug monitoring 04/04/2013   Sciatica of left side 11/22/2011   Aortic valve insufficiency 03/25/2011   Dyspnea 11/24/2010   Essential hypertension, benign 04/18/2010   Hypothyroidism 04/15/2010   PALPITATIONS 04/15/2010   BRADYCARDIA-TACHYCARDIA SYNDROME 12/11/2008   IRRITABLE BOWEL SYNDROME 12/11/2008   COLONIC POLYPS, ADENOMATOUS, HX OF 12/11/2008   CONSTIPATION 11/13/2008  ABDOMINAL BLOATING 11/13/2008   COAGULOPATHY 07/03/2008   Constipation 07/03/2008   CHANGE IN BOWELS 07/03/2008   UNSPECIFIED DISORDER OF THYROID 07/02/2008   DIVERTICULOSIS, COLON 07/02/2008    PCP: Dr Chilton Greathouse   REFERRING PROVIDER: [name] NP  REFERRING DIAG:   Diagnosis  R53.81 (ICD-10-CM) - Physical deconditioning    THERAPY DIAG:  Other abnormalities of gait and mobility  Muscle weakness (generalized)  Rationale for Evaluation  and Treatment: Rehabilitation  ONSET DATE: Around the pandemic she began having shortness of breath   SUBJECTIVE:   SUBJECTIVE STATEMENT: Patient reports since around the time of the pandemic she has had a progressive loss of endurance with activities.  She has to walk from her apartment to the dining hall and that distance has become increasingly difficult for her as she is gone along.  She has an extensive cardiac history which is playing a role.  Recently they switched her medication which she reports is made of immediate improvement.  She frequently went to the pool for exercise.  Since changing her medication she feels like she has been able to go back to the full without as much previous difficulty.  She has a history of multijoint OA.  It does not give her consistent issue but she does have some pain in her foot at times and pain in her left knee.  She has recently purchased a walker.  She uses a mostly so she can sit if she needs to.  She feels like slightly too low.  After assessment and may be slightly low.  PERTINENT HISTORY: A-fib, Chest pain 2022, lower extremity swelling, Pacemaker; Osteopenia,  PAIN:  Are you having pain?  General arthritis that comes and goes.   PRECAUTIONS: Pacemaker   RED FLAGS: None   WEIGHT BEARING RESTRICTIONS: No  FALLS:  Has patient fallen in last 6 months? No  LIVING ENVIRONMENT: Has steps she can walk but she doesn't do them much OCCUPATION:  Retired  Risk manager:  The pool, Walking, reading, going ot to dinner.   PLOF: Independent Uses a walker at this time for longer distances   PATIENT GOALS:  To get the strength in her legs back   NEXT MD VISIT: Nothing scheduled.   OBJECTIVE:   DIAGNOSTIC FINDINGS:  Nothing   PATIENT SURVEYS:  FOTO    COGNITION: Overall cognitive status: Within functional limits for tasks assessed     SENSATION: WFL  EDEMA:  Bilateral LE edema controlled with ted hose and diuretics  MUSCLE  LENGTH: POSTURE: No Significant postural limitations  PALPATION: No tenderness to plapation   LOWER EXTREMITY ROM:  Active ROM Right eval Left eval  Hip flexion    Hip extension    Hip abduction    Hip adduction    Hip internal rotation    Hip external rotation    Knee flexion    Knee extension    Ankle dorsiflexion    Ankle plantarflexion    Ankle inversion    Ankle eversion     (Blank rows = not tested) within normal limits  LOWER EXTREMITY MMT:  MMT Right eval Left eval  Hip flexion 15.5 14.8  Hip extension    Hip abduction 25.3 26.8  Hip adduction    Hip internal rotation    Hip external rotation    Knee flexion    Knee extension 23.9 18.5  Ankle dorsiflexion    Ankle plantarflexion    Ankle inversion    Ankle eversion     (  Blank rows = not tested) WNL  LOWER EXTREMITY SPECIAL TESTS:   FUNCTIONAL TESTS:  6 minute walk test:   5x sit to stand 13 sec  Base line HR 75 bpm  GAIT: Uses walker but does not put much weight on the walker   TODAY'S TREATMENT:                                                                                                                              DATE:   Access Code: [email or web address] URL: [email or web address] Date: 09/14/2022 Prepared by: Lorayne Bender  Exercises - Seated Knee Extension with Resistance  - 1 x daily - 7 x weekly - 3 sets - 15 reps - Seated Hip Abduction with Resistance  - 1 x daily - 7 x weekly - 3 sets - 15 reps - Seated Knee Lifts with Resistance  - 1 x daily - 7 x weekly - 3 sets - 10 reps  PATIENT EDUCATION:  Education details: HEP, symptom management, progression of activity  Person educated: Patient Education method: Explanation, Demonstration, Tactile cues, Verbal cues, and Handouts Education comprehension: verbalized understanding, returned demonstration, verbal cues required, tactile cues required, and needs further education  HOME EXERCISE PROGRAM: Access Code: [email or web address] URL:  [email or web address] Date: 09/14/2022 Prepared by: Lorayne Bender  Exercises - Seated Knee Extension with Resistance  - 1 x daily - 7 x weekly - 3 sets - 15 reps - Seated Hip Abduction with Resistance  - 1 x daily - 7 x weekly - 3 sets - 15 reps - Seated Knee Lifts with Resistance  - 1 x daily - 7 x weekly - 3 sets - 10 reps  ASSESSMENT:  CLINICAL IMPRESSION: Patient is a 89 year old female who presents to physical therapy with deconditioning.  She feels like she can walk about a minute and 1/2 to 2 minutes before she needs to sit down and rest.  She is not able to walk to her dining hall without a rest break at this time.  She was previously very active.  She is now using a walker with a seat on it in case she needs to sit.  She has a basic exercise program which she uses at home.  She reports she was able to do 30 sit to stand transfers.  At this time she feels like she can do maybe about 15.  She has mild limitations in the 5 times sit to stand test.  She has significant limitations in the 6-minute walk test.  She is able to walk 600 feet but required 2 seated rest breaks of greater than a minute.  She would benefit from skilled therapy to improve her overall mobility and increase her endurance OBJECTIVE IMPAIRMENTS: cardiopulmonary status limiting activity, difficulty walking, decreased strength, and impaired perceived functional ability.   ACTIVITY LIMITATIONS: standing, stairs, and locomotion level  PARTICIPATION LIMITATIONS: meal prep, cleaning, laundry, shopping, and community activity  PERSONAL FACTORS: Age and 1 comorbidity: multiple cardiovascular  issues  are also affecting patient's functional outcome.   REHAB POTENTIAL: Good  CLINICAL DECISION MAKING: Evolving/moderate complexity  EVALUATION COMPLEXITY: Moderate   GOALS: Goals reviewed with patient? Yes  SHORT TERM GOALS: Target date: 10/12/2022       Patient will increase gross bilateral lower extremity  strength by 5 pounds Baseline: Goal status: INITIAL  2.  Patient will increase 6-minute walk test distance by 300 feet and reduce rest breaks by 1 Baseline:  Goal status: INITIAL  3.  Patient will decrease time on 5 times sit to stand test by 3 seconds Baseline:  Goal status: INITIAL  4.  Patient will be independent with base exercise program Baseline:  Goal status: INITIAL   LONG TERM GOALS: Target date:  11/09/2022    Patient will walk to her meal hall without increased dyspnea Baseline:  Goal status: INITIAL  2.  Patient will go up and down steps without increase shortness of breath in order to improve community ambulation Baseline:  Goal status: INITIAL  3.  Patient will be able to go to the pool and back without significant fatigue Baseline:  Goal status: INITIAL  4.    PLAN:  PT FREQUENCY: 1-2x/week  PT DURATION: 8 weeks  PLANNED INTERVENTIONS: Therapeutic exercises, Therapeutic activity, Neuromuscular re-education, Balance training, Gait training, Patient/Family education, Self Care, Stair training, DME instructions, Aquatic Therapy; Electrical stimulation, Cryotherapy, Moist heat, Taping, Manual therapy, and Re-evaluation.   PLAN FOR NEXT SESSION:  Consider light stair training; consider intervals on nu-step or bike; monitor HR. Patient can ambualte about 1:30 min wouout a rest break. Work on increasing her time.  Consider gross exercise program.  Patient already has a strong home program to work on.   Dessie Coma, PT 09/14/2022, 4:14 PM

## 2022-09-15 DIAGNOSIS — H0102B Squamous blepharitis left eye, upper and lower eyelids: Secondary | ICD-10-CM | POA: Diagnosis not present

## 2022-09-15 DIAGNOSIS — H16213 Exposure keratoconjunctivitis, bilateral: Secondary | ICD-10-CM | POA: Diagnosis not present

## 2022-09-15 DIAGNOSIS — H1045 Other chronic allergic conjunctivitis: Secondary | ICD-10-CM | POA: Diagnosis not present

## 2022-09-15 DIAGNOSIS — H0102A Squamous blepharitis right eye, upper and lower eyelids: Secondary | ICD-10-CM | POA: Diagnosis not present

## 2022-09-15 DIAGNOSIS — H04123 Dry eye syndrome of bilateral lacrimal glands: Secondary | ICD-10-CM | POA: Diagnosis not present

## 2022-09-16 ENCOUNTER — Encounter (HOSPITAL_BASED_OUTPATIENT_CLINIC_OR_DEPARTMENT_OTHER): Payer: Self-pay

## 2022-09-16 ENCOUNTER — Ambulatory Visit (HOSPITAL_BASED_OUTPATIENT_CLINIC_OR_DEPARTMENT_OTHER): Payer: Medicare Other

## 2022-09-21 ENCOUNTER — Encounter (HOSPITAL_BASED_OUTPATIENT_CLINIC_OR_DEPARTMENT_OTHER): Payer: Self-pay

## 2022-09-21 ENCOUNTER — Ambulatory Visit (HOSPITAL_BASED_OUTPATIENT_CLINIC_OR_DEPARTMENT_OTHER): Payer: Medicare Other

## 2022-09-21 DIAGNOSIS — R2689 Other abnormalities of gait and mobility: Secondary | ICD-10-CM | POA: Diagnosis not present

## 2022-09-21 DIAGNOSIS — M6281 Muscle weakness (generalized): Secondary | ICD-10-CM | POA: Diagnosis not present

## 2022-09-21 DIAGNOSIS — R5381 Other malaise: Secondary | ICD-10-CM | POA: Diagnosis not present

## 2022-09-21 NOTE — Therapy (Signed)
OUTPATIENT PHYSICAL THERAPY LOWER EXTREMITY EVALUATION   Patient Name: TANY RUEB MRN: [id number] DOB:December 01, 1933, 89 y.o., female Today's Date: 09/21/2022  END OF SESSION:  PT End of Session - 09/21/22 1152     Visit Number 2    Number of Visits 16    Date for PT Re-Evaluation 11/09/22    PT Start Time 1152    PT Stop Time 1228    PT Time Calculation (min) 36 min    Activity Tolerance Patient tolerated treatment well    Behavior During Therapy [facility] for tasks assessed/performed             Past Medical History:  Diagnosis Date   Arthritis    Atrial fibrillation (HCC)    Atypical chest pain 09/22/2020   Blood transfusion without reported diagnosis    2017   Bradycardia    s/p PPM   CAD in native artery 03/26/2021   Cataract    Chronic diastolic heart failure (HCC) 03/26/2021   Colonic polyp    adenomatous   Constipation    Diverticulosis    Hemorrhoids    Hyperlipidemia    Hypertension    Hypothyroidism    Irritable bowel syndrome (IBS)    Lower extremity edema 09/22/2020   Osteoporosis    osteopenia   Pacemaker 2010   UTI (urinary tract infection)    Past Surgical History:  Procedure Laterality Date   BREAST CYST ASPIRATION Left 1980s   COLONOSCOPY     ELECTROPHYSIOLOGIC STUDY N/A 07/01/2015   Procedure: Atrial Fibrillation Ablation;  Surgeon: Hillis Range, MD;  Location: [facility] INVASIVE CV LAB;  Service: Cardiovascular;  Laterality: N/A;   EYE SURGERY     heart ablation     for atrial fib   HEMATOMA EVACUATION Right 07/15/2014   Procedure: EVACUATION Right Groin Hematoma;  Surgeon: Larina Earthly, MD;  Location: [facility] OR;  Service: Vascular;  Laterality: Right;   NASAL ENDOSCOPY WITH EPISTAXIS CONTROL N/A 07/14/2014   Procedure: NASAL ENDOSCOPY WITH EPISTAXIS CONTROL;  Surgeon: Melvenia Beam, MD;  Location: [facility] OR;  Service: ENT;  Laterality: N/A;   NASAL ENDOSCOPY WITH EPISTAXIS CONTROL Bilateral 07/15/2014   Procedure: NASAL ENDOSCOPY WITH EPISTAXIS CONTROL;   Surgeon: Melvenia Beam, MD;  Location: [facility] OR;  Service: ENT;  Laterality: Bilateral;   PACEMAKER INSERTION  2010   implanted by Dr Deborah Chalk (MDT)   PPM GENERATOR CHANGEOUT N/A 08/16/2019   Procedure: PPM GENERATOR CHANGEOUT;  Surgeon: Hillis Range, MD;  Location: [facility] INVASIVE CV LAB;  Service: Cardiovascular;  Laterality: N/A;   RADIOLOGY WITH ANESTHESIA N/A 07/14/2014   Procedure: RADIOLOGY WITH ANESTHESIA;  Surgeon: Medication Radiologist, MD;  Location: [facility] OR;  Service: Radiology;  Laterality: N/A;   TEE WITHOUT CARDIOVERSION N/A 07/01/2015   Procedure: TRANSESOPHAGEAL ECHOCARDIOGRAM (TEE);  Surgeon: Quintella Reichert, MD;  Location: [facility] ENDOSCOPY;  Service: Cardiovascular;  Laterality: N/A;   Patient Active Problem List   Diagnosis Date Noted   Chronic diastolic heart failure (HCC) 03/26/2021   CAD in native artery 03/26/2021   Atypical chest pain 09/22/2020   Lower extremity edema 09/22/2020   Genital herpes simplex 09/18/2019   Fatigue 06/08/2018   Ozena 12/14/2016   Rectal pain 11/12/2016   Ptosis of eyelid 04/05/2016   Hypokalemia    Acute blood loss anemia    Hypomagnesemia    Chronic atrial fibrillation (HCC)    Essential hypertension    Other specified hypothyroidism    Posterior epistaxis    Hematoma complicating a  procedure    Diastolic dysfunction 07/14/2014   Epistaxis, recurrent 07/14/2014   Sick sinus syndrome (HCC) 04/29/2014   Encounter for therapeutic drug monitoring 04/04/2013   Sciatica of left side 11/22/2011   Aortic valve insufficiency 03/25/2011   Dyspnea 11/24/2010   Essential hypertension, benign 04/18/2010   Hypothyroidism 04/15/2010   PALPITATIONS 04/15/2010   BRADYCARDIA-TACHYCARDIA SYNDROME 12/11/2008   IRRITABLE BOWEL SYNDROME 12/11/2008   COLONIC POLYPS, ADENOMATOUS, HX OF 12/11/2008   CONSTIPATION 11/13/2008   ABDOMINAL BLOATING 11/13/2008   COAGULOPATHY 07/03/2008   Constipation 07/03/2008   CHANGE IN BOWELS 07/03/2008   UNSPECIFIED DISORDER OF  THYROID 07/02/2008   DIVERTICULOSIS, COLON 07/02/2008    PCP: Dr Chilton Greathouse   REFERRING PROVIDER: [name] NP  REFERRING DIAG:   Diagnosis  R53.81 (ICD-10-CM) - Physical deconditioning    THERAPY DIAG:  Other abnormalities of gait and mobility  Muscle weakness (generalized)  Rationale for Evaluation and Treatment: Rehabilitation  ONSET DATE: Around the pandemic she began having shortness of breath   SUBJECTIVE:   SUBJECTIVE STATEMENT: Pt reports she went to the pool yesterday which made her tired.   PERTINENT HISTORY: A-fib, Chest pain 2022, lower extremity swelling, Pacemaker; Osteopenia,  PAIN:  Are you having pain?  General arthritis that comes and goes.   PRECAUTIONS: Pacemaker   RED FLAGS: None   WEIGHT BEARING RESTRICTIONS: No  FALLS:  Has patient fallen in last 6 months? No  LIVING ENVIRONMENT: Has steps she can walk but she doesn't do them much OCCUPATION:  Retired  Risk manager:  The pool, Walking, reading, going ot to dinner.   PLOF: Independent Uses a walker at this time for longer distances   PATIENT GOALS:  To get the strength in her legs back   NEXT MD VISIT: Nothing scheduled.   OBJECTIVE:   DIAGNOSTIC FINDINGS:  Nothing   PATIENT SURVEYS:  FOTO    COGNITION: Overall cognitive status: Within functional limits for tasks assessed     SENSATION: WFL  EDEMA:  Bilateral LE edema controlled with ted hose and diuretics  MUSCLE LENGTH: POSTURE: No Significant postural limitations  PALPATION: No tenderness to plapation   LOWER EXTREMITY ROM:  Active ROM Right eval Left eval  Hip flexion    Hip extension    Hip abduction    Hip adduction    Hip internal rotation    Hip external rotation    Knee flexion    Knee extension    Ankle dorsiflexion    Ankle plantarflexion    Ankle inversion    Ankle eversion     (Blank rows = not tested) within normal limits  LOWER EXTREMITY MMT:  MMT Right eval Left eval  Hip  flexion 15.5 14.8  Hip extension    Hip abduction 25.3 26.8  Hip adduction    Hip internal rotation    Hip external rotation    Knee flexion    Knee extension 23.9 18.5  Ankle dorsiflexion    Ankle plantarflexion    Ankle inversion    Ankle eversion     (Blank rows = not tested) WNL  LOWER EXTREMITY SPECIAL TESTS:   FUNCTIONAL TESTS:  6 minute walk test:   5x sit to stand 13 sec  Base line HR 75 bpm  GAIT: Uses walker but does not put much weight on the walker   TODAY'S TREATMENT:  DATE:    8/13: - seated march 2# 2x10 -LAQ 2# 2x10ea -Seated clam- GTB 2x15 Sit to stands x10 Ambulation for endurance: 43sec without walker before requiring seated rest break Then 2 min 37 seconds with walker End of session: 42sec with walker -Sidestepping at rail x 3laps   PATIENT EDUCATION:  Education details: HEP, symptom management, progression of activity  Person educated: Patient Education method: Explanation, Demonstration, Tactile cues, Verbal cues, and Handouts Education comprehension: verbalized understanding, returned demonstration, verbal cues required, tactile cues required, and needs further education  HOME EXERCISE PROGRAM: Access Code: [email or web address] URL: [email or web address] Date: 09/14/2022 Prepared by: Lorayne Bender  Exercises - Seated Knee Extension with Resistance  - 1 x daily - 7 x weekly - 3 sets - 15 reps - Seated Hip Abduction with Resistance  - 1 x daily - 7 x weekly - 3 sets - 15 reps - Seated Knee Lifts with Resistance  - 1 x daily - 7 x weekly - 3 sets - 10 reps  ASSESSMENT:  CLINICAL IMPRESSION: Pt had good tolerance for LE strengthening and endurance activities today. Trialed walking with and without walker. She reported walking was easier with walker and she was able to ambulate an additional minute. No  complaints with nu-step. She does have access to nu-step at [facility]; however she said it is more complicated and she will have to have someone help her with the buttons. Encouraged pt to do this to allow her to exercise her legs and build endurance. Will continue to monitor endurance level and progress as tolerated.   OBJECTIVE IMPAIRMENTS: cardiopulmonary status limiting activity, difficulty walking, decreased strength, and impaired perceived functional ability.   ACTIVITY LIMITATIONS: standing, stairs, and locomotion level  PARTICIPATION LIMITATIONS: meal prep, cleaning, laundry, shopping, and community activity  PERSONAL FACTORS: Age and 1 comorbidity: multiple cardiovascular issues  are also affecting patient's functional outcome.   REHAB POTENTIAL: Good  CLINICAL DECISION MAKING: Evolving/moderate complexity  EVALUATION COMPLEXITY: Moderate   GOALS: Goals reviewed with patient? Yes  SHORT TERM GOALS: Target date: 10/12/2022       Patient will increase gross bilateral lower extremity strength by 5 pounds Baseline: Goal status: INITIAL  2.  Patient will increase 6-minute walk test distance by 300 feet and reduce rest breaks by 1 Baseline:  Goal status: INITIAL  3.  Patient will decrease time on 5 times sit to stand test by 3 seconds Baseline:  Goal status: INITIAL  4.  Patient will be independent with base exercise program Baseline:  Goal status: INITIAL   LONG TERM GOALS: Target date:  11/09/2022    Patient will walk to her meal hall without increased dyspnea Baseline:  Goal status: INITIAL  2.  Patient will go up and down steps without increase shortness of breath in order to improve community ambulation Baseline:  Goal status: INITIAL  3.  Patient will be able to go to the pool and back without significant fatigue Baseline:  Goal status: INITIAL  4.    PLAN:  PT FREQUENCY: 1-2x/week  PT DURATION: 8 weeks  PLANNED INTERVENTIONS: Therapeutic  exercises, Therapeutic activity, Neuromuscular re-education, Balance training, Gait training, Patient/Family education, Self Care, Stair training, DME instructions, Aquatic Therapy; Electrical stimulation, Cryotherapy, Moist heat, Taping, Manual therapy, and Re-evaluation.   PLAN FOR NEXT SESSION:  Consider light stair training; consider intervals on nu-step or bike; monitor HR. Patient can ambualte about 1:30 min wouout a rest break. Work on increasing her time.  Consider gross exercise  program.  Patient already has a strong home program to work on.   Donnel Saxon , PTA 09/21/2022, 12:39 PM

## 2022-09-23 ENCOUNTER — Other Ambulatory Visit (HOSPITAL_BASED_OUTPATIENT_CLINIC_OR_DEPARTMENT_OTHER): Payer: Self-pay

## 2022-09-23 MED ORDER — BUMETANIDE 1 MG PO TABS
1.5000 mg | ORAL_TABLET | Freq: Every day | ORAL | 0 refills | Status: DC
  Filled 2022-09-23: qty 135, 90d supply, fill #0

## 2022-09-23 NOTE — Telephone Encounter (Signed)
Chilton Si, MD  to Me     09/23/22 12:54 PM She can use Bumex 1.5 mg instead.  TCR  Spoke to pt to inform her. She stated she has been taking what she had left of Torsemide 20 mg tablets. Informed her that her insurance will not cover the 40 mg tablets and gave Dr. Leonides Sake recommendations. Pt was agreeable.  Sent Bumex 1 mg tablets with instructions to take 1 and 1/2 tablets daily.

## 2022-09-24 ENCOUNTER — Other Ambulatory Visit (HOSPITAL_BASED_OUTPATIENT_CLINIC_OR_DEPARTMENT_OTHER): Payer: Self-pay

## 2022-09-26 ENCOUNTER — Other Ambulatory Visit (HOSPITAL_BASED_OUTPATIENT_CLINIC_OR_DEPARTMENT_OTHER): Payer: Self-pay

## 2022-09-26 ENCOUNTER — Other Ambulatory Visit: Payer: Self-pay | Admitting: Cardiovascular Disease

## 2022-09-27 ENCOUNTER — Other Ambulatory Visit (HOSPITAL_COMMUNITY): Payer: Self-pay

## 2022-09-27 ENCOUNTER — Other Ambulatory Visit (HOSPITAL_BASED_OUTPATIENT_CLINIC_OR_DEPARTMENT_OTHER): Payer: Self-pay

## 2022-09-27 MED ORDER — RIVAROXABAN 15 MG PO TABS
15.0000 mg | ORAL_TABLET | Freq: Every day | ORAL | 2 refills | Status: DC
  Filled 2022-09-27: qty 90, 90d supply, fill #0
  Filled 2022-12-31: qty 38, 38d supply, fill #1
  Filled 2023-02-10: qty 90, 90d supply, fill #2
  Filled 2023-05-05: qty 90, 90d supply, fill #3

## 2022-09-27 NOTE — Telephone Encounter (Signed)
Prescription refill request for Xarelto received.  Indication:afib Last office visit:7/24 Weight:57.8  kg Age:89 Scr:1.05  7/24 CrCl:34.44  ml/min  Prescription refilled

## 2022-09-27 NOTE — Telephone Encounter (Signed)
Please review for refill. Thank you! 

## 2022-09-28 ENCOUNTER — Encounter (HOSPITAL_BASED_OUTPATIENT_CLINIC_OR_DEPARTMENT_OTHER): Payer: Self-pay

## 2022-09-28 ENCOUNTER — Ambulatory Visit (HOSPITAL_BASED_OUTPATIENT_CLINIC_OR_DEPARTMENT_OTHER): Payer: Medicare Other

## 2022-09-28 DIAGNOSIS — M6281 Muscle weakness (generalized): Secondary | ICD-10-CM

## 2022-09-28 DIAGNOSIS — R2689 Other abnormalities of gait and mobility: Secondary | ICD-10-CM

## 2022-09-28 DIAGNOSIS — R5381 Other malaise: Secondary | ICD-10-CM | POA: Diagnosis not present

## 2022-09-28 NOTE — Therapy (Signed)
OUTPATIENT PHYSICAL THERAPY LOWER EXTREMITY EVALUATION   Patient Name: Kathryn Gibson MRN: [id number] DOB:1933-06-24, 89 y.o., female Today's Date: 09/28/2022  END OF SESSION:  PT End of Session - 09/28/22 1453     Visit Number 3    Number of Visits 16    Date for PT Re-Evaluation 11/09/22    PT Start Time 1300    PT Stop Time 1345    PT Time Calculation (min) 45 min    Activity Tolerance Patient tolerated treatment well    Behavior During Therapy [facility] for tasks assessed/performed              Past Medical History:  Diagnosis Date   Arthritis    Atrial fibrillation (HCC)    Atypical chest pain 09/22/2020   Blood transfusion without reported diagnosis    2017   Bradycardia    s/p PPM   CAD in native artery 03/26/2021   Cataract    Chronic diastolic heart failure (HCC) 03/26/2021   Colonic polyp    adenomatous   Constipation    Diverticulosis    Hemorrhoids    Hyperlipidemia    Hypertension    Hypothyroidism    Irritable bowel syndrome (IBS)    Lower extremity edema 09/22/2020   Osteoporosis    osteopenia   Pacemaker 2010   UTI (urinary tract infection)    Past Surgical History:  Procedure Laterality Date   BREAST CYST ASPIRATION Left 1980s   COLONOSCOPY     ELECTROPHYSIOLOGIC STUDY N/A 07/01/2015   Procedure: Atrial Fibrillation Ablation;  Surgeon: Hillis Range, MD;  Location: [facility] INVASIVE CV LAB;  Service: Cardiovascular;  Laterality: N/A;   EYE SURGERY     heart ablation     for atrial fib   HEMATOMA EVACUATION Right 07/15/2014   Procedure: EVACUATION Right Groin Hematoma;  Surgeon: Larina Earthly, MD;  Location: [facility] OR;  Service: Vascular;  Laterality: Right;   NASAL ENDOSCOPY WITH EPISTAXIS CONTROL N/A 07/14/2014   Procedure: NASAL ENDOSCOPY WITH EPISTAXIS CONTROL;  Surgeon: Melvenia Beam, MD;  Location: [facility] OR;  Service: ENT;  Laterality: N/A;   NASAL ENDOSCOPY WITH EPISTAXIS CONTROL Bilateral 07/15/2014   Procedure: NASAL ENDOSCOPY WITH EPISTAXIS CONTROL;   Surgeon: Melvenia Beam, MD;  Location: [facility] OR;  Service: ENT;  Laterality: Bilateral;   PACEMAKER INSERTION  2010   implanted by Dr Deborah Chalk (MDT)   PPM GENERATOR CHANGEOUT N/A 08/16/2019   Procedure: PPM GENERATOR CHANGEOUT;  Surgeon: Hillis Range, MD;  Location: [facility] INVASIVE CV LAB;  Service: Cardiovascular;  Laterality: N/A;   RADIOLOGY WITH ANESTHESIA N/A 07/14/2014   Procedure: RADIOLOGY WITH ANESTHESIA;  Surgeon: Medication Radiologist, MD;  Location: [facility] OR;  Service: Radiology;  Laterality: N/A;   TEE WITHOUT CARDIOVERSION N/A 07/01/2015   Procedure: TRANSESOPHAGEAL ECHOCARDIOGRAM (TEE);  Surgeon: Quintella Reichert, MD;  Location: [facility] ENDOSCOPY;  Service: Cardiovascular;  Laterality: N/A;   Patient Active Problem List   Diagnosis Date Noted   Chronic diastolic heart failure (HCC) 03/26/2021   CAD in native artery 03/26/2021   Atypical chest pain 09/22/2020   Lower extremity edema 09/22/2020   Genital herpes simplex 09/18/2019   Fatigue 06/08/2018   Ozena 12/14/2016   Rectal pain 11/12/2016   Ptosis of eyelid 04/05/2016   Hypokalemia    Acute blood loss anemia    Hypomagnesemia    Chronic atrial fibrillation (HCC)    Essential hypertension    Other specified hypothyroidism    Posterior epistaxis    Hematoma complicating  a procedure    Diastolic dysfunction 07/14/2014   Epistaxis, recurrent 07/14/2014   Sick sinus syndrome (HCC) 04/29/2014   Encounter for therapeutic drug monitoring 04/04/2013   Sciatica of left side 11/22/2011   Aortic valve insufficiency 03/25/2011   Dyspnea 11/24/2010   Essential hypertension, benign 04/18/2010   Hypothyroidism 04/15/2010   PALPITATIONS 04/15/2010   BRADYCARDIA-TACHYCARDIA SYNDROME 12/11/2008   IRRITABLE BOWEL SYNDROME 12/11/2008   COLONIC POLYPS, ADENOMATOUS, HX OF 12/11/2008   CONSTIPATION 11/13/2008   ABDOMINAL BLOATING 11/13/2008   COAGULOPATHY 07/03/2008   Constipation 07/03/2008   CHANGE IN BOWELS 07/03/2008   UNSPECIFIED DISORDER OF  THYROID 07/02/2008   DIVERTICULOSIS, COLON 07/02/2008    PCP: Dr Chilton Greathouse   REFERRING PROVIDER: [name] NP  REFERRING DIAG:   Diagnosis  R53.81 (ICD-10-CM) - Physical deconditioning    THERAPY DIAG:  Other abnormalities of gait and mobility  Muscle weakness (generalized)  Rationale for Evaluation and Treatment: Rehabilitation  ONSET DATE: Around the pandemic she began having shortness of breath   SUBJECTIVE:   SUBJECTIVE STATEMENT: Pt reports she did the leg press and the nu step yesterday at [facility]. Would like to get in the pool again soon, but prefer  to have a friend go with her. Has new walker and questions if the size is appropriate. Pt reports she has been working on her pursed lipped breathing techniques since last session which has significantly helped her SOB.   PERTINENT HISTORY: A-fib, Chest pain 2022, lower extremity swelling, Pacemaker; Osteopenia,  PAIN:  Are you having pain?  General arthritis that comes and goes.   PRECAUTIONS: Pacemaker   RED FLAGS: None   WEIGHT BEARING RESTRICTIONS: No  FALLS:  Has patient fallen in last 6 months? No  LIVING ENVIRONMENT: Has steps she can walk but she doesn't do them much OCCUPATION:  Retired  Risk manager:  The pool, Walking, reading, going ot to dinner.   PLOF: Independent Uses a walker at this time for longer distances   PATIENT GOALS:  To get the strength in her legs back   NEXT MD VISIT: Nothing scheduled.   OBJECTIVE:   DIAGNOSTIC FINDINGS:  Nothing   PATIENT SURVEYS:  FOTO    COGNITION: Overall cognitive status: Within functional limits for tasks assessed     SENSATION: WFL  EDEMA:  Bilateral LE edema controlled with ted hose and diuretics  MUSCLE LENGTH: POSTURE: No Significant postural limitations  PALPATION: No tenderness to plapation   LOWER EXTREMITY ROM:  Active ROM Right eval Left eval  Hip flexion    Hip extension    Hip abduction    Hip adduction     Hip internal rotation    Hip external rotation    Knee flexion    Knee extension    Ankle dorsiflexion    Ankle plantarflexion    Ankle inversion    Ankle eversion     (Blank rows = not tested) within normal limits  LOWER EXTREMITY MMT:  MMT Right eval Left eval  Hip flexion 15.5 14.8  Hip extension    Hip abduction 25.3 26.8  Hip adduction    Hip internal rotation    Hip external rotation    Knee flexion    Knee extension 23.9 18.5  Ankle dorsiflexion    Ankle plantarflexion    Ankle inversion    Ankle eversion     (Blank rows = not tested) WNL  LOWER EXTREMITY SPECIAL TESTS:   FUNCTIONAL TESTS:  6 minute walk test:  5x sit to stand 13 sec  Base line HR 75 bpm  GAIT: Uses walker but does not put much weight on the walker   TODAY'S TREATMENT:                                                                                                                              DATE:    8/20:  -Nu-step L4 - seated march 2.5# 2x20 -LAQ 2.5# 2x20ea Sit to stands 2x10 Hurdles fwd/lateral- 4hurdles x 2 laps each Standing march on airex with fingertip support on counter 2x10 Step ups on airex x10ea with UE support at counter Ambulation for endurance: 30sec with walker End of session: with walker    8/13: - seated march 2# 2x10 -LAQ 2# 2x10ea -Seated clam- GTB 2x15 Sit to stands x10 Ambulation for endurance: 43sec without walker before requiring seated rest break Then 2 min 37 seconds with walker End of session: 42sec with walker -Sidestepping at rail x 3laps   PATIENT EDUCATION:  Education details: HEP, symptom management, progression of activity  Person educated: Patient Education method: Explanation, Demonstration, Tactile cues, Verbal cues, and Handouts Education comprehension: verbalized understanding, returned demonstration, verbal cues required, tactile cues required, and needs further education  HOME EXERCISE  PROGRAM: Access Code: [email or web address] URL: [email or web address] Date: 09/14/2022 Prepared by: Lorayne Bender  Exercises - Seated Knee Extension with Resistance  - 1 x daily - 7 x weekly - 3 sets - 15 reps - Seated Hip Abduction with Resistance  - 1 x daily - 7 x weekly - 3 sets - 15 reps - Seated Knee Lifts with Resistance  - 1 x daily - 7 x weekly - 3 sets - 10 reps  ASSESSMENT:  CLINICAL IMPRESSION: Dan Humphreys appears at appropriate height for pt. Continued to work on improving endurance with exercise/ambulation and LE strengthening. Pt was able to ambulate for increased distances today compared to previous session. Trialled hurdles for obstacle negotiation today with overall good performance, though occasional cues required for correction of circumduction. Difficulty with marches on airex due to unsteadiness and required mild UE support with this.   OBJECTIVE IMPAIRMENTS: cardiopulmonary status limiting activity, difficulty walking, decreased strength, and impaired perceived functional ability.   ACTIVITY LIMITATIONS: standing, stairs, and locomotion level  PARTICIPATION LIMITATIONS: meal prep, cleaning, laundry, shopping, and community activity  PERSONAL FACTORS: Age and 1 comorbidity: multiple cardiovascular issues  are also affecting patient's functional outcome.   REHAB POTENTIAL: Good  CLINICAL DECISION MAKING: Evolving/moderate complexity  EVALUATION COMPLEXITY: Moderate   GOALS: Goals reviewed with patient? Yes  SHORT TERM GOALS: Target date: 10/12/2022       Patient will increase gross bilateral lower extremity strength by 5 pounds Baseline: Goal status: INITIAL  2.  Patient will increase 6-minute walk test distance by 300 feet and reduce rest breaks by 1 Baseline:  Goal status: INITIAL  3.  Patient will decrease time on 5 times sit to  stand test by 3 seconds Baseline:  Goal status: INITIAL  4.  Patient will be independent with base exercise  program Baseline:  Goal status: INITIAL   LONG TERM GOALS: Target date:  11/09/2022    Patient will walk to her meal hall without increased dyspnea Baseline:  Goal status: INITIAL  2.  Patient will go up and down steps without increase shortness of breath in order to improve community ambulation Baseline:  Goal status: INITIAL  3.  Patient will be able to go to the pool and back without significant fatigue Baseline:  Goal status: INITIAL  4.    PLAN:  PT FREQUENCY: 1-2x/week  PT DURATION: 8 weeks  PLANNED INTERVENTIONS: Therapeutic exercises, Therapeutic activity, Neuromuscular re-education, Balance training, Gait training, Patient/Family education, Self Care, Stair training, DME instructions, Aquatic Therapy; Electrical stimulation, Cryotherapy, Moist heat, Taping, Manual therapy, and Re-evaluation.   PLAN FOR NEXT SESSION:  Consider light stair training; consider intervals on nu-step or bike; monitor HR. Patient can ambualte about 1:30 min wouout a rest break. Work on increasing her time.  Consider gross exercise program.  Patient already has a strong home program to work on.   Donnel Saxon Zylon Creamer, PTA 09/28/2022, 2:58 PM

## 2022-09-29 ENCOUNTER — Encounter (HOSPITAL_COMMUNITY): Payer: Self-pay

## 2022-09-29 ENCOUNTER — Other Ambulatory Visit (HOSPITAL_COMMUNITY): Payer: Medicare Other

## 2022-10-05 ENCOUNTER — Encounter (HOSPITAL_BASED_OUTPATIENT_CLINIC_OR_DEPARTMENT_OTHER): Payer: Self-pay

## 2022-10-05 ENCOUNTER — Encounter (HOSPITAL_BASED_OUTPATIENT_CLINIC_OR_DEPARTMENT_OTHER): Payer: Self-pay | Admitting: Cardiovascular Disease

## 2022-10-05 ENCOUNTER — Ambulatory Visit (HOSPITAL_BASED_OUTPATIENT_CLINIC_OR_DEPARTMENT_OTHER): Payer: Medicare Other

## 2022-10-05 ENCOUNTER — Other Ambulatory Visit: Payer: Self-pay

## 2022-10-05 ENCOUNTER — Other Ambulatory Visit: Payer: Self-pay | Admitting: Cardiovascular Disease

## 2022-10-05 ENCOUNTER — Other Ambulatory Visit (HOSPITAL_BASED_OUTPATIENT_CLINIC_OR_DEPARTMENT_OTHER): Payer: Self-pay

## 2022-10-05 DIAGNOSIS — R5381 Other malaise: Secondary | ICD-10-CM | POA: Diagnosis not present

## 2022-10-05 DIAGNOSIS — R0602 Shortness of breath: Secondary | ICD-10-CM

## 2022-10-05 DIAGNOSIS — M6281 Muscle weakness (generalized): Secondary | ICD-10-CM

## 2022-10-05 DIAGNOSIS — I5032 Chronic diastolic (congestive) heart failure: Secondary | ICD-10-CM

## 2022-10-05 DIAGNOSIS — R2689 Other abnormalities of gait and mobility: Secondary | ICD-10-CM

## 2022-10-05 DIAGNOSIS — Z5181 Encounter for therapeutic drug level monitoring: Secondary | ICD-10-CM

## 2022-10-05 MED ORDER — PRAVASTATIN SODIUM 10 MG PO TABS
10.0000 mg | ORAL_TABLET | Freq: Every day | ORAL | 2 refills | Status: DC
  Filled 2022-10-05: qty 90, 90d supply, fill #0
  Filled 2023-01-11: qty 90, 90d supply, fill #1
  Filled 2023-04-08: qty 90, 90d supply, fill #2

## 2022-10-05 NOTE — Telephone Encounter (Signed)
Update

## 2022-10-05 NOTE — Therapy (Signed)
OUTPATIENT PHYSICAL THERAPY LOWER EXTREMITY TREATMENT   Patient Name: Kathryn Gibson MRN: [id number] DOB:Jun 30, 1933, 89 y.o., female Today's Date: 10/05/2022  END OF SESSION:  PT End of Session - 10/05/22 1530     Visit Number 4    Number of Visits 16    Date for PT Re-Evaluation 11/09/22    PT Start Time 1518    PT Stop Time 1600    PT Time Calculation (min) 42 min    Activity Tolerance Patient limited by fatigue    Behavior During Therapy [facility] for tasks assessed/performed               Past Medical History:  Diagnosis Date   Arthritis    Atrial fibrillation (HCC)    Atypical chest pain 09/22/2020   Blood transfusion without reported diagnosis    2017   Bradycardia    s/p PPM   CAD in native artery 03/26/2021   Cataract    Chronic diastolic heart failure (HCC) 03/26/2021   Colonic polyp    adenomatous   Constipation    Diverticulosis    Hemorrhoids    Hyperlipidemia    Hypertension    Hypothyroidism    Irritable bowel syndrome (IBS)    Lower extremity edema 09/22/2020   Osteoporosis    osteopenia   Pacemaker 2010   UTI (urinary tract infection)    Past Surgical History:  Procedure Laterality Date   BREAST CYST ASPIRATION Left 1980s   COLONOSCOPY     ELECTROPHYSIOLOGIC STUDY N/A 07/01/2015   Procedure: Atrial Fibrillation Ablation;  Surgeon: Hillis Range, MD;  Location: [facility] INVASIVE CV LAB;  Service: Cardiovascular;  Laterality: N/A;   EYE SURGERY     heart ablation     for atrial fib   HEMATOMA EVACUATION Right 07/15/2014   Procedure: EVACUATION Right Groin Hematoma;  Surgeon: Larina Earthly, MD;  Location: [facility] OR;  Service: Vascular;  Laterality: Right;   NASAL ENDOSCOPY WITH EPISTAXIS CONTROL N/A 07/14/2014   Procedure: NASAL ENDOSCOPY WITH EPISTAXIS CONTROL;  Surgeon: Melvenia Beam, MD;  Location: [facility] OR;  Service: ENT;  Laterality: N/A;   NASAL ENDOSCOPY WITH EPISTAXIS CONTROL Bilateral 07/15/2014   Procedure: NASAL ENDOSCOPY WITH EPISTAXIS CONTROL;  Surgeon:  Melvenia Beam, MD;  Location: [facility] OR;  Service: ENT;  Laterality: Bilateral;   PACEMAKER INSERTION  2010   implanted by Dr Deborah Chalk (MDT)   PPM GENERATOR CHANGEOUT N/A 08/16/2019   Procedure: PPM GENERATOR CHANGEOUT;  Surgeon: Hillis Range, MD;  Location: [facility] INVASIVE CV LAB;  Service: Cardiovascular;  Laterality: N/A;   RADIOLOGY WITH ANESTHESIA N/A 07/14/2014   Procedure: RADIOLOGY WITH ANESTHESIA;  Surgeon: Medication Radiologist, MD;  Location: [facility] OR;  Service: Radiology;  Laterality: N/A;   TEE WITHOUT CARDIOVERSION N/A 07/01/2015   Procedure: TRANSESOPHAGEAL ECHOCARDIOGRAM (TEE);  Surgeon: Quintella Reichert, MD;  Location: [facility] ENDOSCOPY;  Service: Cardiovascular;  Laterality: N/A;   Patient Active Problem List   Diagnosis Date Noted   Chronic diastolic heart failure (HCC) 03/26/2021   CAD in native artery 03/26/2021   Atypical chest pain 09/22/2020   Lower extremity edema 09/22/2020   Genital herpes simplex 09/18/2019   Fatigue 06/08/2018   Ozena 12/14/2016   Rectal pain 11/12/2016   Ptosis of eyelid 04/05/2016   Hypokalemia    Acute blood loss anemia    Hypomagnesemia    Chronic atrial fibrillation [facility])    Essential hypertension    Other specified hypothyroidism    Posterior epistaxis    Hematoma  complicating a procedure    Diastolic dysfunction 07/14/2014   Epistaxis, recurrent 07/14/2014   Sick sinus syndrome (HCC) 04/29/2014   Encounter for therapeutic drug monitoring 04/04/2013   Sciatica of left side 11/22/2011   Aortic valve insufficiency 03/25/2011   Dyspnea 11/24/2010   Essential hypertension, benign 04/18/2010   Hypothyroidism 04/15/2010   PALPITATIONS 04/15/2010   BRADYCARDIA-TACHYCARDIA SYNDROME 12/11/2008   IRRITABLE BOWEL SYNDROME 12/11/2008   COLONIC POLYPS, ADENOMATOUS, HX OF 12/11/2008   CONSTIPATION 11/13/2008   ABDOMINAL BLOATING 11/13/2008   COAGULOPATHY 07/03/2008   Constipation 07/03/2008   CHANGE IN BOWELS 07/03/2008   UNSPECIFIED DISORDER OF THYROID  07/02/2008   DIVERTICULOSIS, COLON 07/02/2008    PCP: Dr Chilton Greathouse   REFERRING PROVIDER: [name] NP  REFERRING DIAG:   Diagnosis  R53.81 (ICD-10-CM) - Physical deconditioning    THERAPY DIAG:  Other abnormalities of gait and mobility  Muscle weakness (generalized)  Rationale for Evaluation and Treatment: Rehabilitation  ONSET DATE: Around the pandemic she began having shortness of breath   SUBJECTIVE:   SUBJECTIVE STATEMENT: Pt reports she used nu-step on Sunday at level 1 for 27 minutes. Went to pool yesterday. She reports since last night she has experienced increased SOB with activity. "I was out of breath just from putting my walker into my car."  PERTINENT HISTORY: A-fib, Chest pain 2022, lower extremity swelling, Pacemaker; Osteopenia,  PAIN:  Are you having pain?  General arthritis that comes and goes.   PRECAUTIONS: Pacemaker   RED FLAGS: None   WEIGHT BEARING RESTRICTIONS: No  FALLS:  Has patient fallen in last 6 months? No  LIVING ENVIRONMENT: Has steps she can walk but she doesn't do them much OCCUPATION:  Retired  Risk manager:  The pool, Walking, reading, going ot to dinner.   PLOF: Independent Uses a walker at this time for longer distances   PATIENT GOALS:  To get the strength in her legs back   NEXT MD VISIT: Nothing scheduled.   OBJECTIVE:   DIAGNOSTIC FINDINGS:  Nothing   PATIENT SURVEYS:  FOTO    COGNITION: Overall cognitive status: Within functional limits for tasks assessed     SENSATION: WFL  EDEMA:  Bilateral LE edema controlled with ted hose and diuretics  MUSCLE LENGTH: POSTURE: No Significant postural limitations  PALPATION: No tenderness to plapation   LOWER EXTREMITY ROM:  Active ROM Right eval Left eval  Hip flexion    Hip extension    Hip abduction    Hip adduction    Hip internal rotation    Hip external rotation    Knee flexion    Knee extension    Ankle dorsiflexion    Ankle  plantarflexion    Ankle inversion    Ankle eversion     (Blank rows = not tested) within normal limits  LOWER EXTREMITY MMT:  MMT Right eval Left eval  Hip flexion 15.5 14.8  Hip extension    Hip abduction 25.3 26.8  Hip adduction    Hip internal rotation    Hip external rotation    Knee flexion    Knee extension 23.9 18.5  Ankle dorsiflexion    Ankle plantarflexion    Ankle inversion    Ankle eversion     (Blank rows = not tested) WNL  LOWER EXTREMITY SPECIAL TESTS:   FUNCTIONAL TESTS:  6 minute walk test:   5x sit to stand 13 sec  Base line HR 75 bpm  GAIT: Uses walker but does not put much  weight on the walker   TODAY'S TREATMENT:                                                                                                                              DATE:    8/26:  126/73 64bpm 93% spO2  Nu-step L2 - O2:93%  L2- O2: 97%  Gait with walker: 368ft  LAQ 2.5# 2x10ea - seated march 2.5# 2x20 -sit to stands 4x5 (85%O2 after)  8/20:  -Nu-step L4 - seated march 2.5# 2x20 -LAQ 2.5# 2x20ea Sit to stands 2x10 Hurdles fwd/lateral- 4hurdles x 2 laps each Standing march on airex with fingertip support on counter 2x10 Step ups on airex x10ea with UE support at counter Ambulation for endurance: 30sec with walker End of session: with walker    8/13: - seated march 2# 2x10 -LAQ 2# 2x10ea -Seated clam- GTB 2x15 Sit to stands x10 Ambulation for endurance: 43sec without walker before requiring seated rest break Then 2 min 37 seconds with walker End of session: 42sec with walker -Sidestepping at rail x 3laps   PATIENT EDUCATION:  Education details: HEP, symptom management, progression of activity  Person educated: Patient Education method: Explanation, Demonstration, Tactile cues, Verbal cues, and Handouts Education comprehension: verbalized understanding, returned demonstration, verbal cues required, tactile cues  required, and needs further education  HOME EXERCISE PROGRAM: Access Code: [email or web address] URL: [email or web address] Date: 09/14/2022 Prepared by: Lorayne Bender  Exercises - Seated Knee Extension with Resistance  - 1 x daily - 7 x weekly - 3 sets - 15 reps - Seated Hip Abduction with Resistance  - 1 x daily - 7 x weekly - 3 sets - 15 reps - Seated Knee Lifts with Resistance  - 1 x daily - 7 x weekly - 3 sets - 10 reps  ASSESSMENT:  CLINICAL IMPRESSION: Decreased intensity of exercises today due to c/o dyspnea. Monitored O2 % throughout session, which fluctuated between 93 and 97% mainly, though did decrease to 85% after sit to stands. Pt did have difficulty achieving accurate reading with pulse ox instructed pt to notify nurses at [facility] if dyspnea worsens and to have them measure her oxygen levels during periods of exertion to monitor O2.  OBJECTIVE IMPAIRMENTS: cardiopulmonary status limiting activity, difficulty walking, decreased strength, and impaired perceived functional ability.   ACTIVITY LIMITATIONS: standing, stairs, and locomotion level  PARTICIPATION LIMITATIONS: meal prep, cleaning, laundry, shopping, and community activity  PERSONAL FACTORS: Age and 1 comorbidity: multiple cardiovascular issues  are also affecting patient's functional outcome.   REHAB POTENTIAL: Good  CLINICAL DECISION MAKING: Evolving/moderate complexity  EVALUATION COMPLEXITY: Moderate   GOALS: Goals reviewed with patient? Yes  SHORT TERM GOALS: Target date: 10/12/2022       Patient will increase gross bilateral lower extremity strength by 5 pounds Baseline: Goal status: INITIAL  2.  Patient will increase 6-minute walk test distance by 300 feet and reduce rest breaks by  1 Baseline:  Goal status: INITIAL  3.  Patient will decrease time on 5 times sit to stand test by 3 seconds Baseline:  Goal status: INITIAL  4.  Patient will be independent with base exercise  program Baseline:  Goal status: INITIAL   LONG TERM GOALS: Target date:  11/09/2022    Patient will walk to her meal hall without increased dyspnea Baseline:  Goal status: INITIAL  2.  Patient will go up and down steps without increase shortness of breath in order to improve community ambulation Baseline:  Goal status: INITIAL  3.  Patient will be able to go to the pool and back without significant fatigue Baseline:  Goal status: INITIAL  4.    PLAN:  PT FREQUENCY: 1-2x/week  PT DURATION: 8 weeks  PLANNED INTERVENTIONS: Therapeutic exercises, Therapeutic activity, Neuromuscular re-education, Balance training, Gait training, Patient/Family education, Self Care, Stair training, DME instructions, Aquatic Therapy; Electrical stimulation, Cryotherapy, Moist heat, Taping, Manual therapy, and Re-evaluation.   PLAN FOR NEXT SESSION:  Consider light stair training; consider intervals on nu-step or bike; monitor HR. Patient can ambualte about 1:30 min wouout a rest break. Work on increasing her time.  Consider gross exercise program.  Patient already has a strong home program to work on.   Donnel Saxon Kamie Korber, PTA 10/05/2022, 5:09 PM

## 2022-10-06 DIAGNOSIS — I5032 Chronic diastolic (congestive) heart failure: Secondary | ICD-10-CM | POA: Diagnosis not present

## 2022-10-07 LAB — BASIC METABOLIC PANEL
BUN/Creatinine Ratio: 17 (ref 12–28)
BUN: 21 mg/dL (ref 8–27)
CO2: 24 mmol/L (ref 20–29)
Calcium: 9.4 mg/dL (ref 8.7–10.3)
Chloride: 95 mmol/L — ABNORMAL LOW (ref 96–106)
Creatinine, Ser: 1.26 mg/dL — ABNORMAL HIGH (ref 0.57–1.00)
Glucose: 101 mg/dL — ABNORMAL HIGH (ref 70–99)
Potassium: 4 mmol/L (ref 3.5–5.2)
Sodium: 135 mmol/L (ref 134–144)
eGFR: 41 mL/min/{1.73_m2} — ABNORMAL LOW (ref >59)

## 2022-10-07 LAB — BRAIN NATRIURETIC PEPTIDE: BNP: 576.2 pg/mL — ABNORMAL HIGH (ref 0.0–100.0)

## 2022-10-08 ENCOUNTER — Encounter (HOSPITAL_BASED_OUTPATIENT_CLINIC_OR_DEPARTMENT_OTHER): Payer: Self-pay | Admitting: Physical Therapy

## 2022-10-08 ENCOUNTER — Other Ambulatory Visit (HOSPITAL_BASED_OUTPATIENT_CLINIC_OR_DEPARTMENT_OTHER): Payer: Self-pay

## 2022-10-08 ENCOUNTER — Ambulatory Visit (HOSPITAL_BASED_OUTPATIENT_CLINIC_OR_DEPARTMENT_OTHER): Payer: Medicare Other | Admitting: Physical Therapy

## 2022-10-08 DIAGNOSIS — M6281 Muscle weakness (generalized): Secondary | ICD-10-CM | POA: Diagnosis not present

## 2022-10-08 DIAGNOSIS — R2689 Other abnormalities of gait and mobility: Secondary | ICD-10-CM

## 2022-10-08 DIAGNOSIS — R5381 Other malaise: Secondary | ICD-10-CM | POA: Diagnosis not present

## 2022-10-08 MED ORDER — BUMETANIDE 2 MG PO TABS
2.0000 mg | ORAL_TABLET | Freq: Every day | ORAL | 3 refills | Status: DC
  Filled 2022-10-08: qty 90, 90d supply, fill #0

## 2022-10-08 NOTE — Therapy (Signed)
OUTPATIENT PHYSICAL THERAPY LOWER EXTREMITY TREATMENT   Patient Name: Kathryn Gibson MRN: [id number] DOB:07/31/1933, 89 y.o., female Today's Date: 10/08/2022  END OF SESSION:  PT End of Session - 10/08/22 1302     Visit Number 5    Number of Visits 16    Date for PT Re-Evaluation 11/09/22    PT Start Time 1300    PT Stop Time 1340    PT Time Calculation (min) 40 min    Activity Tolerance Patient limited by fatigue    Behavior During Therapy [facility] for tasks assessed/performed                Past Medical History:  Diagnosis Date   Arthritis    Atrial fibrillation (HCC)    Atypical chest pain 09/22/2020   Blood transfusion without reported diagnosis    2017   Bradycardia    s/p PPM   CAD in native artery 03/26/2021   Cataract    Chronic diastolic heart failure (HCC) 03/26/2021   Colonic polyp    adenomatous   Constipation    Diverticulosis    Hemorrhoids    Hyperlipidemia    Hypertension    Hypothyroidism    Irritable bowel syndrome (IBS)    Lower extremity edema 09/22/2020   Osteoporosis    osteopenia   Pacemaker 2010   UTI (urinary tract infection)    Past Surgical History:  Procedure Laterality Date   BREAST CYST ASPIRATION Left 1980s   COLONOSCOPY     ELECTROPHYSIOLOGIC STUDY N/A 07/01/2015   Procedure: Atrial Fibrillation Ablation;  Surgeon: Hillis Range, MD;  Location: [facility] INVASIVE CV LAB;  Service: Cardiovascular;  Laterality: N/A;   EYE SURGERY     heart ablation     for atrial fib   HEMATOMA EVACUATION Right 07/15/2014   Procedure: EVACUATION Right Groin Hematoma;  Surgeon: Larina Earthly, MD;  Location: [facility] OR;  Service: Vascular;  Laterality: Right;   NASAL ENDOSCOPY WITH EPISTAXIS CONTROL N/A 07/14/2014   Procedure: NASAL ENDOSCOPY WITH EPISTAXIS CONTROL;  Surgeon: Melvenia Beam, MD;  Location: [facility] OR;  Service: ENT;  Laterality: N/A;   NASAL ENDOSCOPY WITH EPISTAXIS CONTROL Bilateral 07/15/2014   Procedure: NASAL ENDOSCOPY WITH EPISTAXIS CONTROL;   Surgeon: Melvenia Beam, MD;  Location: [facility] OR;  Service: ENT;  Laterality: Bilateral;   PACEMAKER INSERTION  2010   implanted by Dr Deborah Chalk (MDT)   PPM GENERATOR CHANGEOUT N/A 08/16/2019   Procedure: PPM GENERATOR CHANGEOUT;  Surgeon: Hillis Range, MD;  Location: [facility] INVASIVE CV LAB;  Service: Cardiovascular;  Laterality: N/A;   RADIOLOGY WITH ANESTHESIA N/A 07/14/2014   Procedure: RADIOLOGY WITH ANESTHESIA;  Surgeon: Medication Radiologist, MD;  Location: [facility] OR;  Service: Radiology;  Laterality: N/A;   TEE WITHOUT CARDIOVERSION N/A 07/01/2015   Procedure: TRANSESOPHAGEAL ECHOCARDIOGRAM (TEE);  Surgeon: Quintella Reichert, MD;  Location: [facility] ENDOSCOPY;  Service: Cardiovascular;  Laterality: N/A;   Patient Active Problem List   Diagnosis Date Noted   Chronic diastolic heart failure (HCC) 03/26/2021   CAD in native artery 03/26/2021   Atypical chest pain 09/22/2020   Lower extremity edema 09/22/2020   Genital herpes simplex 09/18/2019   Fatigue 06/08/2018   Ozena 12/14/2016   Rectal pain 11/12/2016   Ptosis of eyelid 04/05/2016   Hypokalemia    Acute blood loss anemia    Hypomagnesemia    Chronic atrial fibrillation [facility])    Essential hypertension    Other specified hypothyroidism    Posterior epistaxis  Hematoma complicating a procedure    Diastolic dysfunction 07/14/2014   Epistaxis, recurrent 07/14/2014   Sick sinus syndrome (HCC) 04/29/2014   Encounter for therapeutic drug monitoring 04/04/2013   Sciatica of left side 11/22/2011   Aortic valve insufficiency 03/25/2011   Dyspnea 11/24/2010   Essential hypertension, benign 04/18/2010   Hypothyroidism 04/15/2010   PALPITATIONS 04/15/2010   BRADYCARDIA-TACHYCARDIA SYNDROME 12/11/2008   IRRITABLE BOWEL SYNDROME 12/11/2008   COLONIC POLYPS, ADENOMATOUS, HX OF 12/11/2008   CONSTIPATION 11/13/2008   ABDOMINAL BLOATING 11/13/2008   COAGULOPATHY 07/03/2008   Constipation 07/03/2008   CHANGE IN BOWELS 07/03/2008   UNSPECIFIED DISORDER OF  THYROID 07/02/2008   DIVERTICULOSIS, COLON 07/02/2008    PCP: Dr Chilton Greathouse   REFERRING PROVIDER: [name] NP  REFERRING DIAG:   Diagnosis  R53.81 (ICD-10-CM) - Physical deconditioning    THERAPY DIAG:  Other abnormalities of gait and mobility  Muscle weakness (generalized)  Rationale for Evaluation and Treatment: Rehabilitation  ONSET DATE: Around the pandemic she began having shortness of breath   SUBJECTIVE:   SUBJECTIVE STATEMENT: The patient reports that she continues to have difficulty with CHF.  She reports she has been very short of breath with limited activity.  She has talked to her cardiologist who adjusted her medications today.  She would like to try to do what she can as far as her exercises ago.  Baseline heart rate 66 SaO2 92% PERTINENT HISTORY: A-fib, Chest pain 2022, lower extremity swelling, Pacemaker; Osteopenia,  PAIN:  Are you having pain?  General arthritis that comes and goes.   PRECAUTIONS: Pacemaker   RED FLAGS: None   WEIGHT BEARING RESTRICTIONS: No  FALLS:  Has patient fallen in last 6 months? No  LIVING ENVIRONMENT: Has steps she can walk but she doesn't do them much OCCUPATION:  Retired  Risk manager:  The pool, Walking, reading, going ot to dinner.   PLOF: Independent Uses a walker at this time for longer distances   PATIENT GOALS:  To get the strength in her legs back   NEXT MD VISIT: Nothing scheduled.   OBJECTIVE:   DIAGNOSTIC FINDINGS:  Nothing   PATIENT SURVEYS:  FOTO    COGNITION: Overall cognitive status: Within functional limits for tasks assessed     SENSATION: WFL  EDEMA:  Bilateral LE edema controlled with ted hose and diuretics  MUSCLE LENGTH: POSTURE: No Significant postural limitations  PALPATION: No tenderness to plapation   LOWER EXTREMITY ROM:  Active ROM Right eval Left eval  Hip flexion    Hip extension    Hip abduction    Hip adduction    Hip internal rotation    Hip  external rotation    Knee flexion    Knee extension    Ankle dorsiflexion    Ankle plantarflexion    Ankle inversion    Ankle eversion     (Blank rows = not tested) within normal limits  LOWER EXTREMITY MMT:  MMT Right eval Left eval  Hip flexion 15.5 14.8  Hip extension    Hip abduction 25.3 26.8  Hip adduction    Hip internal rotation    Hip external rotation    Knee flexion    Knee extension 23.9 18.5  Ankle dorsiflexion    Ankle plantarflexion    Ankle inversion    Ankle eversion     (Blank rows = not tested) WNL  LOWER EXTREMITY SPECIAL TESTS:   FUNCTIONAL TESTS:  6 minute walk test:   5x sit to  stand 13 sec  Base line HR 75 bpm  GAIT: Uses walker but does not put much weight on the walker   TODAY'S TREATMENT:                                                                                                                              DATE:   8/30 NuStep interval 2 minutes x 3 with heart rate and SaO2 monitoring.  Heart rates remain consistent. Interval 1: SaO2 92% Interval 2: SaO2 88% Interval 3: SaO2 72%.  After each interval patient was giving rest to SaO2 to improve.  After each interval SaO2 improved quickly. Seated march 3 x 15 Seated hip abduction 3 x 15 Seated LAQ 2 x 15 each leg All exercises done with green band  Bilateral bicep curl 2 pounds 2 x 15 Bilateral punch 2 pounds 2 x 15 8/26:  126/73 64bpm 93% spO2  Nu-step L2 - O2:93%  L2- O2: 97%  Gait with walker: 328ft  LAQ 2.5# 2x10ea - seated march 2.5# 2x20 -sit to stands 4x5 (85%O2 after)  8/20:  -Nu-step L4 - seated march 2.5# 2x20 -LAQ 2.5# 2x20ea Sit to stands 2x10 Hurdles fwd/lateral- 4hurdles x 2 laps each Standing march on airex with fingertip support on counter 2x10 Step ups on airex x10ea with UE support at counter Ambulation for endurance: 30sec with walker End of session: with walker    8/13: - seated march 2# 2x10 -LAQ 2#  2x10ea -Seated clam- GTB 2x15 Sit to stands x10 Ambulation for endurance: 43sec without walker before requiring seated rest break Then 2 min 37 seconds with walker End of session: 42sec with walker -Sidestepping at rail x 3laps   PATIENT EDUCATION:  Education details: HEP, symptom management, progression of activity  Person educated: Patient Education method: Explanation, Demonstration, Tactile cues, Verbal cues, and Handouts Education comprehension: verbalized understanding, returned demonstration, verbal cues required, tactile cues required, and needs further education  HOME EXERCISE PROGRAM: Access Code: [email or web address] URL: [email or web address] Date: 09/14/2022 Prepared by: Lorayne Bender  Exercises - Seated Knee Extension with Resistance  - 1 x daily - 7 x weekly - 3 sets - 15 reps - Seated Hip Abduction with Resistance  - 1 x daily - 7 x weekly - 3 sets - 15 reps - Seated Knee Lifts with Resistance  - 1 x daily - 7 x weekly - 3 sets - 10 reps  ASSESSMENT:  CLINICAL IMPRESSION: Patient is SaO2 dropped with interval training.  She reported some shortness of breath.  Her SaO2 improved significantly with rest.  She is advised to monitor her symptoms over the next few days.  She has adjusted her medication.  She was advised she should notice a difference in the next few days.  We performed a light exercise today.  She reports she wants to continue to try her exercises at home.  She is advised to proceed if she  feels okay doing them. OBJECTIVE IMPAIRMENTS: cardiopulmonary status limiting activity, difficulty walking, decreased strength, and impaired perceived functional ability.   ACTIVITY LIMITATIONS: standing, stairs, and locomotion level  PARTICIPATION LIMITATIONS: meal prep, cleaning, laundry, shopping, and community activity  PERSONAL FACTORS: Age and 1 comorbidity: multiple cardiovascular issues  are also affecting patient's functional outcome.   REHAB  POTENTIAL: Good  CLINICAL DECISION MAKING: Evolving/moderate complexity  EVALUATION COMPLEXITY: Moderate   GOALS: Goals reviewed with patient? Yes  SHORT TERM GOALS: Target date: 10/12/2022       Patient will increase gross bilateral lower extremity strength by 5 pounds Baseline: Goal status: INITIAL  2.  Patient will increase 6-minute walk test distance by 300 feet and reduce rest breaks by 1 Baseline:  Goal status: INITIAL  3.  Patient will decrease time on 5 times sit to stand test by 3 seconds Baseline:  Goal status: INITIAL  4.  Patient will be independent with base exercise program Baseline:  Goal status: INITIAL   LONG TERM GOALS: Target date:  11/09/2022    Patient will walk to her meal hall without increased dyspnea Baseline:  Goal status: INITIAL  2.  Patient will go up and down steps without increase shortness of breath in order to improve community ambulation Baseline:  Goal status: INITIAL  3.  Patient will be able to go to the pool and back without significant fatigue Baseline:  Goal status: INITIAL  4.    PLAN:  PT FREQUENCY: 1-2x/week  PT DURATION: 8 weeks  PLANNED INTERVENTIONS: Therapeutic exercises, Therapeutic activity, Neuromuscular re-education, Balance training, Gait training, Patient/Family education, Self Care, Stair training, DME instructions, Aquatic Therapy; Electrical stimulation, Cryotherapy, Moist heat, Taping, Manual therapy, and Re-evaluation.   PLAN FOR NEXT SESSION:  Consider light stair training; consider intervals on nu-step or bike; monitor HR. Patient can ambualte about 1:30 min wouout a rest break. Work on increasing her time.  Consider gross exercise program.  Patient already has a strong home program to work on.   Dessie Coma, PT 10/08/2022, 4:14 PM

## 2022-10-08 NOTE — Addendum Note (Signed)
Addended by: Regis Bill B on: 10/08/2022 12:26 PM   Modules accepted: Orders

## 2022-10-08 NOTE — Addendum Note (Signed)
Addended by: Regis Bill B on: 10/08/2022 04:52 PM   Modules accepted: Orders

## 2022-10-13 ENCOUNTER — Ambulatory Visit (HOSPITAL_BASED_OUTPATIENT_CLINIC_OR_DEPARTMENT_OTHER): Payer: Medicare Other | Attending: Internal Medicine | Admitting: Physical Therapy

## 2022-10-13 DIAGNOSIS — R2689 Other abnormalities of gait and mobility: Secondary | ICD-10-CM | POA: Diagnosis not present

## 2022-10-13 DIAGNOSIS — M6281 Muscle weakness (generalized): Secondary | ICD-10-CM | POA: Insufficient documentation

## 2022-10-13 NOTE — Therapy (Signed)
OUTPATIENT PHYSICAL THERAPY LOWER EXTREMITY TREATMENT   Patient Name: Kathryn Gibson MRN: [id number] DOB:1933/06/29, 89 y.o., female Today's Date: 10/13/2022  END OF SESSION:       Past Medical History:  Diagnosis Date   Arthritis    Atrial fibrillation (HCC)    Atypical chest pain 09/22/2020   Blood transfusion without reported diagnosis    2017   Bradycardia    s/p PPM   CAD in native artery 03/26/2021   Cataract    Chronic diastolic heart failure (HCC) 03/26/2021   Colonic polyp    adenomatous   Constipation    Diverticulosis    Hemorrhoids    Hyperlipidemia    Hypertension    Hypothyroidism    Irritable bowel syndrome (IBS)    Lower extremity edema 09/22/2020   Osteoporosis    osteopenia   Pacemaker 2010   UTI (urinary tract infection)    Past Surgical History:  Procedure Laterality Date   BREAST CYST ASPIRATION Left 1980s   COLONOSCOPY     ELECTROPHYSIOLOGIC STUDY N/A 07/01/2015   Procedure: Atrial Fibrillation Ablation;  Surgeon: Hillis Range, MD;  Location: [facility] INVASIVE CV LAB;  Service: Cardiovascular;  Laterality: N/A;   EYE SURGERY     heart ablation     for atrial fib   HEMATOMA EVACUATION Right 07/15/2014   Procedure: EVACUATION Right Groin Hematoma;  Surgeon: Larina Earthly, MD;  Location: [facility] OR;  Service: Vascular;  Laterality: Right;   NASAL ENDOSCOPY WITH EPISTAXIS CONTROL N/A 07/14/2014   Procedure: NASAL ENDOSCOPY WITH EPISTAXIS CONTROL;  Surgeon: Melvenia Beam, MD;  Location: [facility] OR;  Service: ENT;  Laterality: N/A;   NASAL ENDOSCOPY WITH EPISTAXIS CONTROL Bilateral 07/15/2014   Procedure: NASAL ENDOSCOPY WITH EPISTAXIS CONTROL;  Surgeon: Melvenia Beam, MD;  Location: [facility] OR;  Service: ENT;  Laterality: Bilateral;   PACEMAKER INSERTION  2010   implanted by Dr Deborah Chalk (MDT)   PPM GENERATOR CHANGEOUT N/A 08/16/2019   Procedure: PPM GENERATOR CHANGEOUT;  Surgeon: Hillis Range, MD;  Location: [facility] INVASIVE CV LAB;  Service: Cardiovascular;  Laterality: N/A;    RADIOLOGY WITH ANESTHESIA N/A 07/14/2014   Procedure: RADIOLOGY WITH ANESTHESIA;  Surgeon: Medication Radiologist, MD;  Location: [facility] OR;  Service: Radiology;  Laterality: N/A;   TEE WITHOUT CARDIOVERSION N/A 07/01/2015   Procedure: TRANSESOPHAGEAL ECHOCARDIOGRAM (TEE);  Surgeon: Quintella Reichert, MD;  Location: [facility] ENDOSCOPY;  Service: Cardiovascular;  Laterality: N/A;   Patient Active Problem List   Diagnosis Date Noted   Chronic diastolic heart failure (HCC) 03/26/2021   CAD in native artery 03/26/2021   Atypical chest pain 09/22/2020   Lower extremity edema 09/22/2020   Genital herpes simplex 09/18/2019   Fatigue 06/08/2018   Ozena 12/14/2016   Rectal pain 11/12/2016   Ptosis of eyelid 04/05/2016   Hypokalemia    Acute blood loss anemia    Hypomagnesemia    Chronic atrial fibrillation (HCC)    Essential hypertension    Other specified hypothyroidism    Posterior epistaxis    Hematoma complicating a procedure    Diastolic dysfunction 07/14/2014   Epistaxis, recurrent 07/14/2014   Sick sinus syndrome (HCC) 04/29/2014   Encounter for therapeutic drug monitoring 04/04/2013   Sciatica of left side 11/22/2011   Aortic valve insufficiency 03/25/2011   Dyspnea 11/24/2010   Essential hypertension, benign 04/18/2010   Hypothyroidism 04/15/2010   PALPITATIONS 04/15/2010   BRADYCARDIA-TACHYCARDIA SYNDROME 12/11/2008   IRRITABLE BOWEL SYNDROME 12/11/2008   COLONIC POLYPS, ADENOMATOUS, HX OF 12/11/2008  CONSTIPATION 11/13/2008   ABDOMINAL BLOATING 11/13/2008   COAGULOPATHY 07/03/2008   Constipation 07/03/2008   CHANGE IN BOWELS 07/03/2008   UNSPECIFIED DISORDER OF THYROID 07/02/2008   DIVERTICULOSIS, COLON 07/02/2008    PCP: Dr Chilton Greathouse   REFERRING PROVIDER: [name] NP  REFERRING DIAG:   Diagnosis  R53.81 (ICD-10-CM) - Physical deconditioning    THERAPY DIAG:  No diagnosis found.  Rationale for Evaluation and Treatment: Rehabilitation  ONSET DATE: Around the  pandemic she began having shortness of breath   SUBJECTIVE:   SUBJECTIVE STATEMENT: The patient reports she has been feeling better. She felt good yesterday. Today she is a little more SOB. She had some things to do today .   PERTINENT HISTORY: A-fib, Chest pain 2022, lower extremity swelling, Pacemaker; Osteopenia,  PAIN:  Are you having pain?  General arthritis that comes and goes.   PRECAUTIONS: Pacemaker   RED FLAGS: None   WEIGHT BEARING RESTRICTIONS: No  FALLS:  Has patient fallen in last 6 months? No  LIVING ENVIRONMENT: Has steps she can walk but she doesn't do them much OCCUPATION:  Retired  Risk manager:  The pool, Walking, reading, going ot to dinner.   PLOF: Independent Uses a walker at this time for longer distances   PATIENT GOALS:  To get the strength in her legs back   NEXT MD VISIT: Nothing scheduled.   OBJECTIVE:   DIAGNOSTIC FINDINGS:  Nothing   PATIENT SURVEYS:  FOTO    COGNITION: Overall cognitive status: Within functional limits for tasks assessed     SENSATION: WFL  EDEMA:  Bilateral LE edema controlled with ted hose and diuretics  MUSCLE LENGTH: POSTURE: No Significant postural limitations  PALPATION: No tenderness to plapation   LOWER EXTREMITY ROM:  Active ROM Right eval Left eval  Hip flexion    Hip extension    Hip abduction    Hip adduction    Hip internal rotation    Hip external rotation    Knee flexion    Knee extension    Ankle dorsiflexion    Ankle plantarflexion    Ankle inversion    Ankle eversion     (Blank rows = not tested) within normal limits  LOWER EXTREMITY MMT:  MMT Right eval Left eval  Hip flexion 15.5 14.8  Hip extension    Hip abduction 25.3 26.8  Hip adduction    Hip internal rotation    Hip external rotation    Knee flexion    Knee extension 23.9 18.5  Ankle dorsiflexion    Ankle plantarflexion    Ankle inversion    Ankle eversion     (Blank rows = not tested) WNL  LOWER  EXTREMITY SPECIAL TESTS:   FUNCTIONAL TESTS:  6 minute walk test:   5x sit to stand 13 sec  Base line HR 75 bpm  GAIT: Uses walker but does not put much weight on the walker   TODAY'S TREATMENT:  DATE:   9/4 Interval 1: SaO2 90% Interval 2: SaO2 92% Interval 3: SaO2 99%.  2 min intervals L5  Hurdles: weaving 4 laps    Side steps 4 laps   Fwd / back 4 laps  Air-ex step on and off x20ach leg   Step up 4 inch x 10 each leg Lateral step up 4 inch x 10 each leg  8/30 NuStep interval 2 minutes x 3 with heart rate and SaO2 monitoring.  Heart rates remain consistent. Interval 1: SaO2 92% Interval 2: SaO2 88% Interval 3: SaO2 72%.  After each interval patient was giving rest to SaO2 to improve.  After each interval SaO2 improved quickly. Seated march 3 x 15 Seated hip abduction 3 x 15 Seated LAQ 2 x 15 each leg All exercises done with green band  Bilateral bicep curl 2 pounds 2 x 15 Bilateral punch 2 pounds 2 x 15 8/26:  126/73 64bpm 93% spO2  Nu-step L2 - O2:93%  L2- O2: 97%  Gait with walker: 342ft  LAQ 2.5# 2x10ea - seated march 2.5# 2x20 -sit to stands 4x5 (85%O2 after)  8/20:  -Nu-step L4 - seated march 2.5# 2x20 -LAQ 2.5# 2x20ea Sit to stands 2x10 Hurdles fwd/lateral- 4hurdles x 2 laps each Standing march on airex with fingertip support on counter 2x10 Step ups on airex x10ea with UE support at counter Ambulation for endurance: 30sec with walker End of session: with walker    PATIENT EDUCATION:  Education details: HEP, symptom management, progression of activity  Person educated: Patient Education method: Explanation, Demonstration, Tactile cues, Verbal cues, and Handouts Education comprehension: verbalized understanding, returned demonstration, verbal cues required, tactile cues required, and  needs further education  HOME EXERCISE PROGRAM: Access Code: [email or web address] URL: [email or web address] Date: 09/14/2022 Prepared by: Lorayne Bender  Exercises - Seated Knee Extension with Resistance  - 1 x daily - 7 x weekly - 3 sets - 15 reps - Seated Hip Abduction with Resistance  - 1 x daily - 7 x weekly - 3 sets - 15 reps - Seated Knee Lifts with Resistance  - 1 x daily - 7 x weekly - 3 sets - 10 reps  ASSESSMENT:  CLINICAL IMPRESSION:  Patient tolerated treatment well.  She reported the initial fatigue.  She reported improved endurance as the session went on.  We worked on Occupational psychologist.  She was able to complete hurdles with and without difficulty.  We also worked on stepping balance.  Will continue to progress activity as her endurance and breathing tolerate.  We monitor her SaO2 on the NuStep with no significant drop today.  OBJECTIVE IMPAIRMENTS: cardiopulmonary status limiting activity, difficulty walking, decreased strength, and impaired perceived functional ability.   ACTIVITY LIMITATIONS: standing, stairs, and locomotion level  PARTICIPATION LIMITATIONS: meal prep, cleaning, laundry, shopping, and community activity  PERSONAL FACTORS: Age and 1 comorbidity: multiple cardiovascular issues  are also affecting patient's functional outcome.   REHAB POTENTIAL: Good  CLINICAL DECISION MAKING: Evolving/moderate complexity  EVALUATION COMPLEXITY: Moderate   GOALS: Goals reviewed with patient? Yes  SHORT TERM GOALS: Target date: 10/12/2022       Patient will increase gross bilateral lower extremity strength by 5 pounds Baseline: Goal status: INITIAL  2.  Patient will increase 6-minute walk test distance by 300 feet and reduce rest breaks by 1 Baseline:  Goal status: INITIAL  3.  Patient will decrease time on 5 times sit to stand test by 3 seconds Baseline:  Goal status: INITIAL  4.  Patient will be independent with base exercise  program Baseline:  Goal status: INITIAL   LONG TERM GOALS: Target date:  11/09/2022    Patient will walk to her meal hall without increased dyspnea Baseline:  Goal status: INITIAL  2.  Patient will go up and down steps without increase shortness of breath in order to improve community ambulation Baseline:  Goal status: INITIAL  3.  Patient will be able to go to the pool and back without significant fatigue Baseline:  Goal status: INITIAL  4.    PLAN:  PT FREQUENCY: 1-2x/week  PT DURATION: 8 weeks  PLANNED INTERVENTIONS: Therapeutic exercises, Therapeutic activity, Neuromuscular re-education, Balance training, Gait training, Patient/Family education, Self Care, Stair training, DME instructions, Aquatic Therapy; Electrical stimulation, Cryotherapy, Moist heat, Taping, Manual therapy, and Re-evaluation.   PLAN FOR NEXT SESSION:  Consider light stair training; consider intervals on nu-step or bike; monitor HR. Patient can ambualte about 1:30 min wouout a rest break. Work on increasing her time.  Consider gross exercise program.  Patient already has a strong home program to work on.   Dessie Coma, PT 10/13/2022, 3:56 PM

## 2022-10-15 ENCOUNTER — Encounter (HOSPITAL_BASED_OUTPATIENT_CLINIC_OR_DEPARTMENT_OTHER): Payer: Self-pay

## 2022-10-15 ENCOUNTER — Ambulatory Visit (HOSPITAL_BASED_OUTPATIENT_CLINIC_OR_DEPARTMENT_OTHER): Payer: Medicare Other

## 2022-10-15 DIAGNOSIS — R2689 Other abnormalities of gait and mobility: Secondary | ICD-10-CM | POA: Diagnosis not present

## 2022-10-15 DIAGNOSIS — M6281 Muscle weakness (generalized): Secondary | ICD-10-CM

## 2022-10-15 NOTE — Therapy (Signed)
OUTPATIENT PHYSICAL THERAPY LOWER EXTREMITY TREATMENT   Patient Name: Kathryn Gibson MRN: [id number] DOB:Feb 24, 1933, 89 y.o., female Today's Date: 10/15/2022  END OF SESSION:  PT End of Session - 10/15/22 1426     Visit Number 7    Number of Visits 16    Date for PT Re-Evaluation 11/09/22    PT Start Time 1348    PT Stop Time 1430    PT Time Calculation (min) 42 min    Equipment Utilized During Treatment Gait belt    Activity Tolerance Patient tolerated treatment well    Behavior During Therapy [facility] for tasks assessed/performed                 Past Medical History:  Diagnosis Date   Arthritis    Atrial fibrillation (HCC)    Atypical chest pain 09/22/2020   Blood transfusion without reported diagnosis    2017   Bradycardia    s/p PPM   CAD in native artery 03/26/2021   Cataract    Chronic diastolic heart failure (HCC) 03/26/2021   Colonic polyp    adenomatous   Constipation    Diverticulosis    Hemorrhoids    Hyperlipidemia    Hypertension    Hypothyroidism    Irritable bowel syndrome (IBS)    Lower extremity edema 09/22/2020   Osteoporosis    osteopenia   Pacemaker 2010   UTI (urinary tract infection)    Past Surgical History:  Procedure Laterality Date   BREAST CYST ASPIRATION Left 1980s   COLONOSCOPY     ELECTROPHYSIOLOGIC STUDY N/A 07/01/2015   Procedure: Atrial Fibrillation Ablation;  Surgeon: Hillis Range, MD;  Location: [facility] INVASIVE CV LAB;  Service: Cardiovascular;  Laterality: N/A;   EYE SURGERY     heart ablation     for atrial fib   HEMATOMA EVACUATION Right 07/15/2014   Procedure: EVACUATION Right Groin Hematoma;  Surgeon: Larina Earthly, MD;  Location: [facility] OR;  Service: Vascular;  Laterality: Right;   NASAL ENDOSCOPY WITH EPISTAXIS CONTROL N/A 07/14/2014   Procedure: NASAL ENDOSCOPY WITH EPISTAXIS CONTROL;  Surgeon: Melvenia Beam, MD;  Location: [facility] OR;  Service: ENT;  Laterality: N/A;   NASAL ENDOSCOPY WITH EPISTAXIS CONTROL Bilateral 07/15/2014    Procedure: NASAL ENDOSCOPY WITH EPISTAXIS CONTROL;  Surgeon: Melvenia Beam, MD;  Location: [facility] OR;  Service: ENT;  Laterality: Bilateral;   PACEMAKER INSERTION  2010   implanted by Dr Deborah Chalk (MDT)   PPM GENERATOR CHANGEOUT N/A 08/16/2019   Procedure: PPM GENERATOR CHANGEOUT;  Surgeon: Hillis Range, MD;  Location: [facility] INVASIVE CV LAB;  Service: Cardiovascular;  Laterality: N/A;   RADIOLOGY WITH ANESTHESIA N/A 07/14/2014   Procedure: RADIOLOGY WITH ANESTHESIA;  Surgeon: Medication Radiologist, MD;  Location: [facility] OR;  Service: Radiology;  Laterality: N/A;   TEE WITHOUT CARDIOVERSION N/A 07/01/2015   Procedure: TRANSESOPHAGEAL ECHOCARDIOGRAM (TEE);  Surgeon: Quintella Reichert, MD;  Location: [facility] ENDOSCOPY;  Service: Cardiovascular;  Laterality: N/A;   Patient Active Problem List   Diagnosis Date Noted   Chronic diastolic heart failure (HCC) 03/26/2021   CAD in native artery 03/26/2021   Atypical chest pain 09/22/2020   Lower extremity edema 09/22/2020   Genital herpes simplex 09/18/2019   Fatigue 06/08/2018   Ozena 12/14/2016   Rectal pain 11/12/2016   Ptosis of eyelid 04/05/2016   Hypokalemia    Acute blood loss anemia    Hypomagnesemia    Chronic atrial fibrillation (HCC)    Essential hypertension    Other  specified hypothyroidism    Posterior epistaxis    Hematoma complicating a procedure    Diastolic dysfunction 07/14/2014   Epistaxis, recurrent 07/14/2014   Sick sinus syndrome (HCC) 04/29/2014   Encounter for therapeutic drug monitoring 04/04/2013   Sciatica of left side 11/22/2011   Aortic valve insufficiency 03/25/2011   Dyspnea 11/24/2010   Essential hypertension, benign 04/18/2010   Hypothyroidism 04/15/2010   PALPITATIONS 04/15/2010   BRADYCARDIA-TACHYCARDIA SYNDROME 12/11/2008   IRRITABLE BOWEL SYNDROME 12/11/2008   COLONIC POLYPS, ADENOMATOUS, HX OF 12/11/2008   CONSTIPATION 11/13/2008   ABDOMINAL BLOATING 11/13/2008   COAGULOPATHY 07/03/2008   Constipation 07/03/2008    CHANGE IN BOWELS 07/03/2008   UNSPECIFIED DISORDER OF THYROID 07/02/2008   DIVERTICULOSIS, COLON 07/02/2008    PCP: Dr Chilton Greathouse   REFERRING PROVIDER: [name] NP  REFERRING DIAG:   Diagnosis  R53.81 (ICD-10-CM) - Physical deconditioning    THERAPY DIAG:  Other abnormalities of gait and mobility  Muscle weakness (generalized)  Rationale for Evaluation and Treatment: Rehabilitation  ONSET DATE: Around the pandemic she began having shortness of breath   SUBJECTIVE:   SUBJECTIVE STATEMENT: Pt reports doing exercises at home. Pt went to dinner last night in dining room and did not take walker in with her. Pt reports continued SOB. Sees MD 9/18.    PERTINENT HISTORY: A-fib, Chest pain 2022, lower extremity swelling, Pacemaker; Osteopenia,  PAIN:  Are you having pain?  General arthritis that comes and goes.   PRECAUTIONS: Pacemaker   RED FLAGS: None   WEIGHT BEARING RESTRICTIONS: No  FALLS:  Has patient fallen in last 6 months? No  LIVING ENVIRONMENT: Has steps she can walk but she doesn't do them much OCCUPATION:  Retired  Risk manager:  The pool, Walking, reading, going ot to dinner.   PLOF: Independent Uses a walker at this time for longer distances   PATIENT GOALS:  To get the strength in her legs back   NEXT MD VISIT: Nothing scheduled.   OBJECTIVE:   DIAGNOSTIC FINDINGS:  Nothing   PATIENT SURVEYS:  FOTO    COGNITION: Overall cognitive status: Within functional limits for tasks assessed     SENSATION: WFL  EDEMA:  Bilateral LE edema controlled with ted hose and diuretics  MUSCLE LENGTH: POSTURE: No Significant postural limitations  PALPATION: No tenderness to plapation   LOWER EXTREMITY ROM:  Active ROM Right eval Left eval  Hip flexion    Hip extension    Hip abduction    Hip adduction    Hip internal rotation    Hip external rotation    Knee flexion    Knee extension    Ankle dorsiflexion    Ankle plantarflexion     Ankle inversion    Ankle eversion     (Blank rows = not tested) within normal limits  LOWER EXTREMITY MMT:  MMT Right eval Left eval  Hip flexion 15.5 14.8  Hip extension    Hip abduction 25.3 26.8  Hip adduction    Hip internal rotation    Hip external rotation    Knee flexion    Knee extension 23.9 18.5  Ankle dorsiflexion    Ankle plantarflexion    Ankle inversion    Ankle eversion     (Blank rows = not tested) WNL  LOWER EXTREMITY SPECIAL TESTS:   FUNCTIONAL TESTS:  6 minute walk test:   5x sit to stand 13 sec  Base line HR 75 bpm  GAIT: Uses walker but does not put  much weight on the walker   TODAY'S TREATMENT:                                                                                                                              DATE:    9/6  Interval 1: SaO2 96% Interval 2: SaO2 96% Interval 3: SaO2 94%.  2 min intervals L5, switched to L4 after  Hurdles: 4 hurdles step over no UE support with CGA x4 laps  Lateral hurdle step overs x4 hurdles x4 laps- no UE support, CGA   Air-ex step on and off x20ach leg, SBA no UE support  Romberg stance on airex 3x 30seconds  HR/TR on airex at rail x20  Step up 6 inch 2x 10 each leg Lateral step up 4 inch 2x 10 each leg  9/4 Interval 1: SaO2 90% Interval 2: SaO2 92% Interval 3: SaO2 99%.  2 min intervals L5  Hurdles: weaving 4 laps    Side steps 4 laps   Fwd / back 4 laps  Air-ex step on and off x20ach leg   Step up 4 inch x 10 each leg Lateral step up 4 inch x 10 each leg  8/30 NuStep interval 2 minutes x 3 with heart rate and SaO2 monitoring.  Heart rates remain consistent. Interval 1: SaO2 92% Interval 2: SaO2 88% Interval 3: SaO2 72%.  After each interval patient was giving rest to SaO2 to improve.  After each interval SaO2 improved quickly. Seated march 3 x 15 Seated hip abduction 3 x 15 Seated LAQ 2 x 15 each leg All exercises done with green band  Bilateral bicep curl  2 pounds 2 x 15 Bilateral punch 2 pounds 2 x 15 8/26:  126/73 64bpm 93% spO2  Nu-step L2 - O2:93%  L2- O2: 97%  Gait with walker: 377ft  LAQ 2.5# 2x10ea - seated march 2.5# 2x20 -sit to stands 4x5 (85%O2 after)  8/20:  -Nu-step L4 - seated march 2.5# 2x20 -LAQ 2.5# 2x20ea Sit to stands 2x10 Hurdles fwd/lateral- 4hurdles x 2 laps each Standing march on airex with fingertip support on counter 2x10 Step ups on airex x10ea with UE support at counter Ambulation for endurance: 30sec with walker End of session: with walker    PATIENT EDUCATION:  Education details: HEP, symptom management, progression of activity  Person educated: Patient Education method: Explanation, Demonstration, Tactile cues, Verbal cues, and Handouts Education comprehension: verbalized understanding, returned demonstration, verbal cues required, tactile cues required, and needs further education  HOME EXERCISE PROGRAM: Access Code: [email or web address] URL: [email or web address] Date: 09/14/2022 Prepared by: Lorayne Bender  Exercises - Seated Knee Extension with Resistance  - 1 x daily - 7 x weekly - 3 sets - 15 reps - Seated Hip Abduction with Resistance  - 1 x daily - 7 x weekly - 3 sets - 15 reps - Seated Knee Lifts with Resistance  - 1 x daily - 7  x weekly - 3 sets - 10 reps  ASSESSMENT:  CLINICAL IMPRESSION:  O2% dropped from 96% to 94% following on nu-step, but no significant SOB. She denied pain or difficulty with exercises. Able to complete hurdles without UE supports, though CGA was provided as a precaution. She was able to increase step height to 6" with good performance. Will continue to progress as tolerated while monitoring SOB. Instructed pt to reach out to MD sooner if this continues to bother her or if it worsens.   OBJECTIVE IMPAIRMENTS: cardiopulmonary status limiting activity, difficulty walking, decreased strength, and impaired perceived  functional ability.   ACTIVITY LIMITATIONS: standing, stairs, and locomotion level  PARTICIPATION LIMITATIONS: meal prep, cleaning, laundry, shopping, and community activity  PERSONAL FACTORS: Age and 1 comorbidity: multiple cardiovascular issues  are also affecting patient's functional outcome.   REHAB POTENTIAL: Good  CLINICAL DECISION MAKING: Evolving/moderate complexity  EVALUATION COMPLEXITY: Moderate   GOALS: Goals reviewed with patient? Yes  SHORT TERM GOALS: Target date: 10/12/2022       Patient will increase gross bilateral lower extremity strength by 5 pounds Baseline: Goal status: INITIAL  2.  Patient will increase 6-minute walk test distance by 300 feet and reduce rest breaks by 1 Baseline:  Goal status: INITIAL  3.  Patient will decrease time on 5 times sit to stand test by 3 seconds Baseline:  Goal status: INITIAL  4.  Patient will be independent with base exercise program Baseline:  Goal status: INITIAL   LONG TERM GOALS: Target date:  11/09/2022    Patient will walk to her meal hall without increased dyspnea Baseline:  Goal status: INITIAL  2.  Patient will go up and down steps without increase shortness of breath in order to improve community ambulation Baseline:  Goal status: INITIAL  3.  Patient will be able to go to the pool and back without significant fatigue Baseline:  Goal status: INITIAL  4.    PLAN:  PT FREQUENCY: 1-2x/week  PT DURATION: 8 weeks  PLANNED INTERVENTIONS: Therapeutic exercises, Therapeutic activity, Neuromuscular re-education, Balance training, Gait training, Patient/Family education, Self Care, Stair training, DME instructions, Aquatic Therapy; Electrical stimulation, Cryotherapy, Moist heat, Taping, Manual therapy, and Re-evaluation.   PLAN FOR NEXT SESSION:  Consider light stair training; consider intervals on nu-step or bike; monitor HR. Patient can ambualte about 1:30 min wouout a rest break. Work on  increasing her time.  Consider gross exercise program.  Patient already has a strong home program to work on.   Donnel Saxon Ruari Mudgett, PTA 10/15/2022, 3:08 PM

## 2022-10-19 ENCOUNTER — Encounter (HOSPITAL_BASED_OUTPATIENT_CLINIC_OR_DEPARTMENT_OTHER): Payer: Self-pay | Admitting: Physical Therapy

## 2022-10-19 ENCOUNTER — Ambulatory Visit (HOSPITAL_BASED_OUTPATIENT_CLINIC_OR_DEPARTMENT_OTHER): Payer: Medicare Other | Admitting: Physical Therapy

## 2022-10-19 DIAGNOSIS — M6281 Muscle weakness (generalized): Secondary | ICD-10-CM | POA: Diagnosis not present

## 2022-10-19 DIAGNOSIS — R2689 Other abnormalities of gait and mobility: Secondary | ICD-10-CM | POA: Diagnosis not present

## 2022-10-19 NOTE — Therapy (Signed)
OUTPATIENT PHYSICAL THERAPY LOWER EXTREMITY TREATMENT   Patient Name: Kathryn Gibson MRN: [id number] DOB:1933/08/06, 89 y.o., female Today's Date: 10/19/2022  END OF SESSION:  PT End of Session - 10/19/22 1214     Visit Number 8    Number of Visits 16    Date for PT Re-Evaluation 11/09/22    PT Start Time 1147    PT Stop Time 1228    PT Time Calculation (min) 41 min    Activity Tolerance Patient tolerated treatment well    Behavior During Therapy [facility] for tasks assessed/performed                 Past Medical History:  Diagnosis Date   Arthritis    Atrial fibrillation (HCC)    Atypical chest pain 09/22/2020   Blood transfusion without reported diagnosis    2017   Bradycardia    s/p PPM   CAD in native artery 03/26/2021   Cataract    Chronic diastolic heart failure (HCC) 03/26/2021   Colonic polyp    adenomatous   Constipation    Diverticulosis    Hemorrhoids    Hyperlipidemia    Hypertension    Hypothyroidism    Irritable bowel syndrome (IBS)    Lower extremity edema 09/22/2020   Osteoporosis    osteopenia   Pacemaker 2010   UTI (urinary tract infection)    Past Surgical History:  Procedure Laterality Date   BREAST CYST ASPIRATION Left 1980s   COLONOSCOPY     ELECTROPHYSIOLOGIC STUDY N/A 07/01/2015   Procedure: Atrial Fibrillation Ablation;  Surgeon: Hillis Range, MD;  Location: [facility] INVASIVE CV LAB;  Service: Cardiovascular;  Laterality: N/A;   EYE SURGERY     heart ablation     for atrial fib   HEMATOMA EVACUATION Right 07/15/2014   Procedure: EVACUATION Right Groin Hematoma;  Surgeon: Larina Earthly, MD;  Location: [facility] OR;  Service: Vascular;  Laterality: Right;   NASAL ENDOSCOPY WITH EPISTAXIS CONTROL N/A 07/14/2014   Procedure: NASAL ENDOSCOPY WITH EPISTAXIS CONTROL;  Surgeon: Melvenia Beam, MD;  Location: [facility] OR;  Service: ENT;  Laterality: N/A;   NASAL ENDOSCOPY WITH EPISTAXIS CONTROL Bilateral 07/15/2014   Procedure: NASAL ENDOSCOPY WITH EPISTAXIS CONTROL;   Surgeon: Melvenia Beam, MD;  Location: [facility] OR;  Service: ENT;  Laterality: Bilateral;   PACEMAKER INSERTION  2010   implanted by Dr Deborah Chalk (MDT)   PPM GENERATOR CHANGEOUT N/A 08/16/2019   Procedure: PPM GENERATOR CHANGEOUT;  Surgeon: Hillis Range, MD;  Location: [facility] INVASIVE CV LAB;  Service: Cardiovascular;  Laterality: N/A;   RADIOLOGY WITH ANESTHESIA N/A 07/14/2014   Procedure: RADIOLOGY WITH ANESTHESIA;  Surgeon: Medication Radiologist, MD;  Location: [facility] OR;  Service: Radiology;  Laterality: N/A;   TEE WITHOUT CARDIOVERSION N/A 07/01/2015   Procedure: TRANSESOPHAGEAL ECHOCARDIOGRAM (TEE);  Surgeon: Quintella Reichert, MD;  Location: [facility] ENDOSCOPY;  Service: Cardiovascular;  Laterality: N/A;   Patient Active Problem List   Diagnosis Date Noted   Chronic diastolic heart failure (HCC) 03/26/2021   CAD in native artery 03/26/2021   Atypical chest pain 09/22/2020   Lower extremity edema 09/22/2020   Genital herpes simplex 09/18/2019   Fatigue 06/08/2018   Ozena 12/14/2016   Rectal pain 11/12/2016   Ptosis of eyelid 04/05/2016   Hypokalemia    Acute blood loss anemia    Hypomagnesemia    Chronic atrial fibrillation [facility])    Essential hypertension    Other specified hypothyroidism    Posterior epistaxis  Hematoma complicating a procedure    Diastolic dysfunction 07/14/2014   Epistaxis, recurrent 07/14/2014   Sick sinus syndrome (HCC) 04/29/2014   Encounter for therapeutic drug monitoring 04/04/2013   Sciatica of left side 11/22/2011   Aortic valve insufficiency 03/25/2011   Dyspnea 11/24/2010   Essential hypertension, benign 04/18/2010   Hypothyroidism 04/15/2010   PALPITATIONS 04/15/2010   BRADYCARDIA-TACHYCARDIA SYNDROME 12/11/2008   IRRITABLE BOWEL SYNDROME 12/11/2008   COLONIC POLYPS, ADENOMATOUS, HX OF 12/11/2008   CONSTIPATION 11/13/2008   ABDOMINAL BLOATING 11/13/2008   COAGULOPATHY 07/03/2008   Constipation 07/03/2008   CHANGE IN BOWELS 07/03/2008   UNSPECIFIED DISORDER OF  THYROID 07/02/2008   DIVERTICULOSIS, COLON 07/02/2008    PCP: Dr Chilton Greathouse   REFERRING PROVIDER: [name] NP  REFERRING DIAG:   Diagnosis  R53.81 (ICD-10-CM) - Physical deconditioning    THERAPY DIAG:  Other abnormalities of gait and mobility  Muscle weakness (generalized)  Rationale for Evaluation and Treatment: Rehabilitation  ONSET DATE: Around the pandemic she began having shortness of breath   SUBJECTIVE:   SUBJECTIVE STATEMENT: Pt reports doing exercises at home. Pt went to dinner last night in dining room and did not take walker in with her. Pt reports continued SOB. Sees MD 9/18.    PERTINENT HISTORY: A-fib, Chest pain 2022, lower extremity swelling, Pacemaker; Osteopenia,  PAIN:  Are you having pain?  General arthritis that comes and goes.   PRECAUTIONS: Pacemaker   RED FLAGS: None   WEIGHT BEARING RESTRICTIONS: No  FALLS:  Has patient fallen in last 6 months? No  LIVING ENVIRONMENT: Has steps she can walk but she doesn't do them much OCCUPATION:  Retired  Risk manager:  The pool, Walking, reading, going ot to dinner.   PLOF: Independent Uses a walker at this time for longer distances   PATIENT GOALS:  To get the strength in her legs back   NEXT MD VISIT: Nothing scheduled.   OBJECTIVE:   DIAGNOSTIC FINDINGS:  Nothing   PATIENT SURVEYS:  FOTO    COGNITION: Overall cognitive status: Within functional limits for tasks assessed     SENSATION: WFL  EDEMA:  Bilateral LE edema controlled with ted hose and diuretics  MUSCLE LENGTH: POSTURE: No Significant postural limitations  PALPATION: No tenderness to plapation   LOWER EXTREMITY ROM:  Active ROM Right eval Left eval  Hip flexion    Hip extension    Hip abduction    Hip adduction    Hip internal rotation    Hip external rotation    Knee flexion    Knee extension    Ankle dorsiflexion    Ankle plantarflexion    Ankle inversion    Ankle eversion     (Blank rows  = not tested) within normal limits  LOWER EXTREMITY MMT:  MMT Right eval Left eval  Hip flexion 15.5 14.8  Hip extension    Hip abduction 25.3 26.8  Hip adduction    Hip internal rotation    Hip external rotation    Knee flexion    Knee extension 23.9 18.5  Ankle dorsiflexion    Ankle plantarflexion    Ankle inversion    Ankle eversion     (Blank rows = not tested) WNL  LOWER EXTREMITY SPECIAL TESTS:   FUNCTIONAL TESTS:  6 minute walk test:   5x sit to stand 13 sec  Base line HR 75 bpm  GAIT: Uses walker but does not put much weight on the walker   TODAY'S TREATMENT:  DATE:   9/10 5 min L3   Hurdles: 5 hurdles step over no UE support with CGA x4 laps 5 hurdles backwards/ forwards 4 laps   Gait:  Long strides 100'  March 100'   Walk with head turns 50' Walk with head nod 50'  CGA -> min a with all   UE:   Bicpes curls 2 lbs 12x10 Punch 2 lbs 2x15  Press 2x15 2lbs       9/6  Interval 1: SaO2 96% Interval 2: SaO2 96% Interval 3: SaO2 94%.  2 min intervals L5, switched to L4 after  Hurdles: 4 hurdles step over no UE support with CGA x4 laps  Lateral hurdle step overs x4 hurdles x4 laps- no UE support, CGA   Air-ex step on and off x20ach leg, SBA no UE support  Romberg stance on airex 3x 30seconds  HR/TR on airex at rail x20  Step up 6 inch 2x 10 each leg Lateral step up 4 inch 2x 10 each leg  9/4 Interval 1: SaO2 90% Interval 2: SaO2 92% Interval 3: SaO2 99%.  2 min intervals L5  Hurdles: weaving 4 laps    Side steps 4 laps   Fwd / back 4 laps  Air-ex step on and off x20ach leg   Step up 4 inch x 10 each leg Lateral step up 4 inch x 10 each leg  8/30 NuStep interval 2 minutes x 3 with heart rate and SaO2 monitoring.  Heart rates remain consistent. Interval 1: SaO2 92% Interval 2: SaO2  88% Interval 3: SaO2 72%.  After each interval patient was giving rest to SaO2 to improve.  After each interval SaO2 improved quickly. Seated march 3 x 15 Seated hip abduction 3 x 15 Seated LAQ 2 x 15 each leg All exercises done with green band  Bilateral bicep curl 2 pounds 2 x 15 Bilateral punch 2 pounds 2 x 15 8/26:  126/73 64bpm 93% spO2  Nu-step L2 - O2:93%  L2- O2: 97%  Gait with walker: 337ft  LAQ 2.5# 2x10ea - seated march 2.5# 2x20 -sit to stands 4x5 (85%O2 after)  8/20:  -Nu-step L4 - seated march 2.5# 2x20 -LAQ 2.5# 2x20ea Sit to stands 2x10 Hurdles fwd/lateral- 4hurdles x 2 laps each Standing march on airex with fingertip support on counter 2x10 Step ups on airex x10ea with UE support at counter Ambulation for endurance: 30sec with walker End of session: with walker    PATIENT EDUCATION:  Education details: HEP, symptom management, progression of activity  Person educated: Patient Education method: Explanation, Demonstration, Tactile cues, Verbal cues, and Handouts Education comprehension: verbalized understanding, returned demonstration, verbal cues required, tactile cues required, and needs further education  HOME EXERCISE PROGRAM: Access Code: [email or web address] URL: [email or web address] Date: 09/14/2022 Prepared by: Lorayne Bender  Exercises - Seated Knee Extension with Resistance  - 1 x daily - 7 x weekly - 3 sets - 15 reps - Seated Hip Abduction with Resistance  - 1 x daily - 7 x weekly - 3 sets - 15 reps - Seated Knee Lifts with Resistance  - 1 x daily - 7 x weekly - 3 sets - 10 reps  ASSESSMENT:  CLINICAL IMPRESSION: Despite baseline fatigue, the patient did well with treatment. We worked more on dynamic motion and endurance exercises today. She was fatigued at the end of the gait training but recovered quickly. We will perform a re-assessment next week.   OBJECTIVE IMPAIRMENTS: cardiopulmonary status  limiting activity, difficulty walking, decreased strength, and impaired perceived functional ability.   ACTIVITY LIMITATIONS: standing, stairs, and locomotion level  PARTICIPATION LIMITATIONS: meal prep, cleaning, laundry, shopping, and community activity  PERSONAL FACTORS: Age and 1 comorbidity: multiple cardiovascular issues  are also affecting patient's functional outcome.   REHAB POTENTIAL: Good  CLINICAL DECISION MAKING: Evolving/moderate complexity  EVALUATION COMPLEXITY: Moderate   GOALS: Goals reviewed with patient? Yes  SHORT TERM GOALS: Target date: 10/12/2022       Patient will increase gross bilateral lower extremity strength by 5 pounds Baseline: Goal status: INITIAL  2.  Patient will increase 6-minute walk test distance by 300 feet and reduce rest breaks by 1 Baseline:  Goal status: INITIAL  3.  Patient will decrease time on 5 times sit to stand test by 3 seconds Baseline:  Goal status: INITIAL  4.  Patient will be independent with base exercise program Baseline:  Goal status: INITIAL   LONG TERM GOALS: Target date:  11/09/2022    Patient will walk to her meal hall without increased dyspnea Baseline:  Goal status: INITIAL  2.  Patient will go up and down steps without increase shortness of breath in order to improve community ambulation Baseline:  Goal status: INITIAL  3.  Patient will be able to go to the pool and back without significant fatigue Baseline:  Goal status: INITIAL  4.    PLAN:  PT FREQUENCY: 1-2x/week  PT DURATION: 8 weeks  PLANNED INTERVENTIONS: Therapeutic exercises, Therapeutic activity, Neuromuscular re-education, Balance training, Gait training, Patient/Family education, Self Care, Stair training, DME instructions, Aquatic Therapy; Electrical stimulation, Cryotherapy, Moist heat, Taping, Manual therapy, and Re-evaluation.   PLAN FOR NEXT SESSION:  Consider light stair training; consider intervals on nu-step or bike;  monitor HR. Patient can ambualte about 1:30 min wouout a rest break. Work on increasing her time.  Consider gross exercise program.  Patient already has a strong home program to work on.   Dessie Coma, PT 10/19/2022, 12:16 PM

## 2022-10-21 ENCOUNTER — Ambulatory Visit (HOSPITAL_BASED_OUTPATIENT_CLINIC_OR_DEPARTMENT_OTHER): Payer: Medicare Other

## 2022-10-21 ENCOUNTER — Encounter (HOSPITAL_BASED_OUTPATIENT_CLINIC_OR_DEPARTMENT_OTHER): Payer: Self-pay

## 2022-10-21 DIAGNOSIS — R2689 Other abnormalities of gait and mobility: Secondary | ICD-10-CM

## 2022-10-21 DIAGNOSIS — M6281 Muscle weakness (generalized): Secondary | ICD-10-CM

## 2022-10-21 NOTE — Therapy (Signed)
OUTPATIENT PHYSICAL THERAPY LOWER EXTREMITY TREATMENT   Patient Name: Kathryn Gibson MRN: [id number] DOB:18-Nov-1933, 89 y.o., female Today's Date: 10/21/2022  END OF SESSION:  PT End of Session - 10/21/22 1425     Visit Number 9    Number of Visits 16    Date for PT Re-Evaluation 11/09/22    PT Start Time 1142    PT Stop Time 1229    PT Time Calculation (min) 47 min    Equipment Utilized During Treatment Gait belt    Activity Tolerance Patient tolerated treatment well    Behavior During Therapy [facility] for tasks assessed/performed                  Past Medical History:  Diagnosis Date   Arthritis    Atrial fibrillation (HCC)    Atypical chest pain 09/22/2020   Blood transfusion without reported diagnosis    2017   Bradycardia    s/p PPM   CAD in native artery 03/26/2021   Cataract    Chronic diastolic heart failure (HCC) 03/26/2021   Colonic polyp    adenomatous   Constipation    Diverticulosis    Hemorrhoids    Hyperlipidemia    Hypertension    Hypothyroidism    Irritable bowel syndrome (IBS)    Lower extremity edema 09/22/2020   Osteoporosis    osteopenia   Pacemaker 2010   UTI (urinary tract infection)    Past Surgical History:  Procedure Laterality Date   BREAST CYST ASPIRATION Left 1980s   COLONOSCOPY     ELECTROPHYSIOLOGIC STUDY N/A 07/01/2015   Procedure: Atrial Fibrillation Ablation;  Surgeon: Hillis Range, MD;  Location: [facility] INVASIVE CV LAB;  Service: Cardiovascular;  Laterality: N/A;   EYE SURGERY     heart ablation     for atrial fib   HEMATOMA EVACUATION Right 07/15/2014   Procedure: EVACUATION Right Groin Hematoma;  Surgeon: Larina Earthly, MD;  Location: [facility] OR;  Service: Vascular;  Laterality: Right;   NASAL ENDOSCOPY WITH EPISTAXIS CONTROL N/A 07/14/2014   Procedure: NASAL ENDOSCOPY WITH EPISTAXIS CONTROL;  Surgeon: Melvenia Beam, MD;  Location: [facility] OR;  Service: ENT;  Laterality: N/A;   NASAL ENDOSCOPY WITH EPISTAXIS CONTROL Bilateral 07/15/2014    Procedure: NASAL ENDOSCOPY WITH EPISTAXIS CONTROL;  Surgeon: Melvenia Beam, MD;  Location: [facility] OR;  Service: ENT;  Laterality: Bilateral;   PACEMAKER INSERTION  2010   implanted by Dr Deborah Chalk (MDT)   PPM GENERATOR CHANGEOUT N/A 08/16/2019   Procedure: PPM GENERATOR CHANGEOUT;  Surgeon: Hillis Range, MD;  Location: [facility] INVASIVE CV LAB;  Service: Cardiovascular;  Laterality: N/A;   RADIOLOGY WITH ANESTHESIA N/A 07/14/2014   Procedure: RADIOLOGY WITH ANESTHESIA;  Surgeon: Medication Radiologist, MD;  Location: [facility] OR;  Service: Radiology;  Laterality: N/A;   TEE WITHOUT CARDIOVERSION N/A 07/01/2015   Procedure: TRANSESOPHAGEAL ECHOCARDIOGRAM (TEE);  Surgeon: Quintella Reichert, MD;  Location: [facility] ENDOSCOPY;  Service: Cardiovascular;  Laterality: N/A;   Patient Active Problem List   Diagnosis Date Noted   Chronic diastolic heart failure (HCC) 03/26/2021   CAD in native artery 03/26/2021   Atypical chest pain 09/22/2020   Lower extremity edema 09/22/2020   Genital herpes simplex 09/18/2019   Fatigue 06/08/2018   [place] 12/14/2016   Rectal pain 11/12/2016   Ptosis of eyelid 04/05/2016   Hypokalemia    Acute blood loss anemia    Hypomagnesemia    Chronic atrial fibrillation (HCC)    Essential hypertension  Other specified hypothyroidism    Posterior epistaxis    Hematoma complicating a procedure    Diastolic dysfunction 07/14/2014   Epistaxis, recurrent 07/14/2014   Sick sinus syndrome (HCC) 04/29/2014   Encounter for therapeutic drug monitoring 04/04/2013   Sciatica of left side 11/22/2011   Aortic valve insufficiency 03/25/2011   Dyspnea 11/24/2010   Essential hypertension, benign 04/18/2010   Hypothyroidism 04/15/2010   PALPITATIONS 04/15/2010   BRADYCARDIA-TACHYCARDIA SYNDROME 12/11/2008   IRRITABLE BOWEL SYNDROME 12/11/2008   COLONIC POLYPS, ADENOMATOUS, HX OF 12/11/2008   CONSTIPATION 11/13/2008   ABDOMINAL BLOATING 11/13/2008   COAGULOPATHY 07/03/2008   Constipation 07/03/2008    CHANGE IN BOWELS 07/03/2008   UNSPECIFIED DISORDER OF THYROID 07/02/2008   DIVERTICULOSIS, COLON 07/02/2008    PCP: Dr Chilton Greathouse   REFERRING PROVIDER: [name] NP  REFERRING DIAG:   Diagnosis  R53.81 (ICD-10-CM) - Physical deconditioning    THERAPY DIAG:  Other abnormalities of gait and mobility  Muscle weakness (generalized)  Rationale for Evaluation and Treatment: Rehabilitation  ONSET DATE: Around the pandemic she began having shortness of breath   SUBJECTIVE:   SUBJECTIVE STATEMENT: Pt reports she had wine last night which caused some LE swelling. "I'm dragging this morning."  PERTINENT HISTORY: A-fib, Chest pain 2022, lower extremity swelling, Pacemaker; Osteopenia,  PAIN:  Are you having pain?  General arthritis that comes and goes.   PRECAUTIONS: Pacemaker   RED FLAGS: None   WEIGHT BEARING RESTRICTIONS: No  FALLS:  Has patient fallen in last 6 months? No  LIVING ENVIRONMENT: Has steps she can walk but she doesn't do them much OCCUPATION:  Retired  Risk manager:  The pool, Walking, reading, going ot to dinner.   PLOF: Independent Uses a walker at this time for longer distances   PATIENT GOALS:  To get the strength in her legs back   NEXT MD VISIT: Nothing scheduled.   OBJECTIVE:   DIAGNOSTIC FINDINGS:  Nothing   PATIENT SURVEYS:  FOTO    COGNITION: Overall cognitive status: Within functional limits for tasks assessed     SENSATION: WFL  EDEMA:  Bilateral LE edema controlled with ted hose and diuretics  MUSCLE LENGTH: POSTURE: No Significant postural limitations  PALPATION: No tenderness to plapation   LOWER EXTREMITY ROM:  Active ROM Right eval Left eval  Hip flexion    Hip extension    Hip abduction    Hip adduction    Hip internal rotation    Hip external rotation    Knee flexion    Knee extension    Ankle dorsiflexion    Ankle plantarflexion    Ankle inversion    Ankle eversion     (Blank rows = not  tested) within normal limits  LOWER EXTREMITY MMT:  MMT Right eval Left eval  Hip flexion 15.5 14.8  Hip extension    Hip abduction 25.3 26.8  Hip adduction    Hip internal rotation    Hip external rotation    Knee flexion    Knee extension 23.9 18.5  Ankle dorsiflexion    Ankle plantarflexion    Ankle inversion    Ankle eversion     (Blank rows = not tested) WNL  LOWER EXTREMITY SPECIAL TESTS:   FUNCTIONAL TESTS:  6 minute walk test:   5x sit to stand 13 sec  Base line HR 75 bpm  GAIT: Uses walker but does not put much weight on the walker   TODAY'S TREATMENT:  DATE:    9/12 5 min L4  Hurdles: 4 hurdles step over no UE support with CGA/SBA x4 laps 4 hurdles lateral 4 laps   Romberg stance on airex with EC- 2x30sec  Step ups onto airex 2x10 Sit to stands x20  Gait without walker x27ft  Rhomberg with reaching across body- standing on airex x10ea  9/10 5 min L3   Hurdles: 5 hurdles step over no UE support with CGA x4 laps 5 hurdles backwards/ forwards 4 laps   Gait:  Long strides 100'  March 100'   Walk with head turns 50' Walk with head nod 50'  CGA -> min a with all   UE:   Bicpes curls 2 lbs 12x10 Punch 2 lbs 2x15  Press 2x15 2lbs       9/6  Interval 1: SaO2 96% Interval 2: SaO2 96% Interval 3: SaO2 94%.  2 min intervals L5, switched to L4 after  Hurdles: 4 hurdles step over no UE support with CGA x4 laps  Lateral hurdle step overs x4 hurdles x4 laps- no UE support, CGA   Air-ex step on and off x20ach leg, SBA no UE support  Romberg stance on airex 3x 30seconds  HR/TR on airex at rail x20  Step up 6 inch 2x 10 each leg Lateral step up 4 inch 2x 10 each leg  9/4 Interval 1: SaO2 90% Interval 2: SaO2 92% Interval 3: SaO2 99%.  2 min intervals L5  Hurdles: weaving 4 laps    Side steps 4  laps   Fwd / back 4 laps  Air-ex step on and off x20ach leg   Step up 4 inch x 10 each leg Lateral step up 4 inch x 10 each leg  8/30 NuStep interval 2 minutes x 3 with heart rate and SaO2 monitoring.  Heart rates remain consistent. Interval 1: SaO2 92% Interval 2: SaO2 88% Interval 3: SaO2 72%.  After each interval patient was giving rest to SaO2 to improve.  After each interval SaO2 improved quickly. Seated march 3 x 15 Seated hip abduction 3 x 15 Seated LAQ 2 x 15 each leg All exercises done with green band  Bilateral bicep curl 2 pounds 2 x 15 Bilateral punch 2 pounds 2 x 15 8/26:  126/73 64bpm 93% spO2  Nu-step L2 - O2:93%  L2- O2: 97%  Gait with walker: 331ft  LAQ 2.5# 2x10ea - seated march 2.5# 2x20 -sit to stands 4x5 (85%O2 after)  8/20:  -Nu-step L4 - seated march 2.5# 2x20 -LAQ 2.5# 2x20ea Sit to stands 2x10 Hurdles fwd/lateral- 4hurdles x 2 laps each Standing march on airex with fingertip support on counter 2x10 Step ups on airex x10ea with UE support at counter Ambulation for endurance: 30sec with walker End of session: with walker    PATIENT EDUCATION:  Education details: HEP, symptom management, progression of activity  Person educated: Patient Education method: Explanation, Demonstration, Tactile cues, Verbal cues, and Handouts Education comprehension: verbalized understanding, returned demonstration, verbal cues required, tactile cues required, and needs further education  HOME EXERCISE PROGRAM: Access Code: [email or web address] URL: [email or web address] Date: 09/14/2022 Prepared by: Lorayne Bender  Exercises - Seated Knee Extension with Resistance  - 1 x daily - 7 x weekly - 3 sets - 15 reps - Seated Hip Abduction with Resistance  - 1 x daily - 7 x weekly - 3 sets - 15 reps - Seated Knee Lifts with Resistance  - 1 x daily - 7  x weekly - 3 sets - 10 reps  ASSESSMENT:  CLINICAL IMPRESSION: Despite  baseline fatigue, the patient did well with treatment. We worked more on dynamic motion and endurance exercises today. She was fatigued at the end of the gait training but recovered quickly. We will perform a re-assessment next week.   OBJECTIVE IMPAIRMENTS: cardiopulmonary status limiting activity, difficulty walking, decreased strength, and impaired perceived functional ability.   ACTIVITY LIMITATIONS: standing, stairs, and locomotion level  PARTICIPATION LIMITATIONS: meal prep, cleaning, laundry, shopping, and community activity  PERSONAL FACTORS: Age and 1 comorbidity: multiple cardiovascular issues  are also affecting patient's functional outcome.   REHAB POTENTIAL: Good  CLINICAL DECISION MAKING: Evolving/moderate complexity  EVALUATION COMPLEXITY: Moderate   GOALS: Goals reviewed with patient? Yes  SHORT TERM GOALS: Target date: 10/12/2022       Patient will increase gross bilateral lower extremity strength by 5 pounds Baseline: Goal status: INITIAL  2.  Patient will increase 6-minute walk test distance by 300 feet and reduce rest breaks by 1 Baseline:  Goal status: INITIAL  3.  Patient will decrease time on 5 times sit to stand test by 3 seconds Baseline:  Goal status: INITIAL  4.  Patient will be independent with base exercise program Baseline:  Goal status: INITIAL   LONG TERM GOALS: Target date:  11/09/2022    Patient will walk to her meal hall without increased dyspnea Baseline:  Goal status: INITIAL  2.  Patient will go up and down steps without increase shortness of breath in order to improve community ambulation Baseline:  Goal status: INITIAL  3.  Patient will be able to go to the pool and back without significant fatigue Baseline:  Goal status: INITIAL  4.    PLAN:  PT FREQUENCY: 1-2x/week  PT DURATION: 8 weeks  PLANNED INTERVENTIONS: Therapeutic exercises, Therapeutic activity, Neuromuscular re-education, Balance training, Gait  training, Patient/Family education, Self Care, Stair training, DME instructions, Aquatic Therapy; Electrical stimulation, Cryotherapy, Moist heat, Taping, Manual therapy, and Re-evaluation.   PLAN FOR NEXT SESSION:  Consider light stair training; consider intervals on nu-step or bike; monitor HR. Patient can ambualte about 1:30 min wouout a rest break. Work on increasing her time.  Consider gross exercise program.  Patient already has a strong home program to work on.   Donnel Saxon Issak Goley, PTA 10/21/2022, 2:44 PM

## 2022-10-21 NOTE — Telephone Encounter (Signed)
Due for repeat labs today or tomorrow as already ordered. Recommend she continue current dosing until her follow up with Dr. Duke Salvia next week.   Alver Sorrow, NP

## 2022-10-22 DIAGNOSIS — Z5181 Encounter for therapeutic drug level monitoring: Secondary | ICD-10-CM | POA: Diagnosis not present

## 2022-10-22 DIAGNOSIS — I5032 Chronic diastolic (congestive) heart failure: Secondary | ICD-10-CM | POA: Diagnosis not present

## 2022-10-22 DIAGNOSIS — R0602 Shortness of breath: Secondary | ICD-10-CM | POA: Diagnosis not present

## 2022-10-23 ENCOUNTER — Other Ambulatory Visit (HOSPITAL_BASED_OUTPATIENT_CLINIC_OR_DEPARTMENT_OTHER): Payer: Self-pay

## 2022-10-24 LAB — BASIC METABOLIC PANEL
BUN/Creatinine Ratio: 25 (ref 12–28)
BUN: 26 mg/dL (ref 8–27)
CO2: 24 mmol/L (ref 20–29)
Calcium: 9.2 mg/dL (ref 8.7–10.3)
Chloride: 97 mmol/L (ref 96–106)
Creatinine, Ser: 1.04 mg/dL — ABNORMAL HIGH (ref 0.57–1.00)
Glucose: 81 mg/dL (ref 70–99)
Potassium: 3.8 mmol/L (ref 3.5–5.2)
Sodium: 138 mmol/L (ref 134–144)
eGFR: 52 mL/min/{1.73_m2} — ABNORMAL LOW (ref >59)

## 2022-10-24 LAB — BRAIN NATRIURETIC PEPTIDE: BNP: 529.5 pg/mL — ABNORMAL HIGH (ref 0.0–100.0)

## 2022-10-26 ENCOUNTER — Ambulatory Visit (HOSPITAL_BASED_OUTPATIENT_CLINIC_OR_DEPARTMENT_OTHER): Payer: Medicare Other

## 2022-10-26 ENCOUNTER — Encounter (HOSPITAL_BASED_OUTPATIENT_CLINIC_OR_DEPARTMENT_OTHER): Payer: Self-pay

## 2022-10-27 ENCOUNTER — Encounter (HOSPITAL_BASED_OUTPATIENT_CLINIC_OR_DEPARTMENT_OTHER): Payer: Self-pay | Admitting: Cardiovascular Disease

## 2022-10-27 ENCOUNTER — Ambulatory Visit (HOSPITAL_BASED_OUTPATIENT_CLINIC_OR_DEPARTMENT_OTHER): Payer: Medicare Other | Admitting: Cardiovascular Disease

## 2022-10-27 VITALS — BP 126/72 | HR 61 | Ht 60.0 in | Wt 124.7 lb

## 2022-10-27 DIAGNOSIS — I5032 Chronic diastolic (congestive) heart failure: Secondary | ICD-10-CM

## 2022-10-27 DIAGNOSIS — I351 Nonrheumatic aortic (valve) insufficiency: Secondary | ICD-10-CM | POA: Diagnosis not present

## 2022-10-27 DIAGNOSIS — I10 Essential (primary) hypertension: Secondary | ICD-10-CM | POA: Diagnosis not present

## 2022-10-27 DIAGNOSIS — I2510 Atherosclerotic heart disease of native coronary artery without angina pectoris: Secondary | ICD-10-CM | POA: Diagnosis not present

## 2022-10-27 DIAGNOSIS — I482 Chronic atrial fibrillation, unspecified: Secondary | ICD-10-CM

## 2022-10-27 MED ORDER — BUMETANIDE 2 MG PO TABS: 2.0000 mg | ORAL_TABLET | Freq: Two times a day (BID) | ORAL | Status: DC

## 2022-10-27 NOTE — Progress Notes (Deleted)
Cardiology Office Note:  .   Date:  10/27/2022  ID:  Kathryn Gibson, DOB [birth year], MRN [id number] PCP: Chilton Greathouse, MD  [facility] Providers Cardiologist:  Chilton Si, MD Electrophysiologist:  Maurice Small, MD { Click to update primary MD,subspecialty MD or APP then REFRESH:1}   History of Present Illness: .   Kathryn Gibson is a 89 y.o. female with a hx of atrial fibrillation s/p ablation, SSS s/p PPM, severe tricuspid rotation, moderate mitral regurgitation, aortic atherosclerosis, asymptomatic coronary calcifications, recurrent epistaxis, and hypertension, who presents for follow-up.  Kathryn Gibson was previously a patient of Dr. Patty Sermons.  She underwent ablation for atrial fibrillation on 07/01/15.  She had an episode of atrial fibrillation 12/2015.  Amiodarone was previously increased due to atrial tachycardia.  This was discontinued due to elevated LFTs which subsequently normalized.  She saw Dr. Jacques Navy 07/2018 and reported exertional dyspnea.  Dr. Jacques Navy offered stress testing which she wanted to think about.  Her device was interrogated 01/08/19 and was nearing ERI.  There were no episodes of atrial fibrillation noted.   Her generator was changed out 08/2019.  Afterward she reported feeling poorly and felt like her heart was racing, though is in sinus rhythm in the 90s.  Metoprolol was increased.  She saw Dr. Johney Frame on 05/2020 and was doing well.  Her A. fib burden was at 3.4%.  On her remote check 08/2020 it was 10.6%. She followed up with EP on 11/2020 and they did not make any changes. She had some chest tightness that was thought to be due to her thoracic spine fracture. She had a nuclear stress test 09/2020 that was normal.   She reported some LE edema. BNP was 310. Lasix was increased to 40 mg. She followed up with EP 05/2021 and was doing well with minimal Afib. She was previously on 40 mg of Lasix every day and felt better.  However she had mild renal dysfunction.   We changed her Lasix to 40 mg on Monday, Wednesday, Friday, and continued with 20 mg on the other days. At her follow-up 12/2021 with EP she was doing well. On 03/10/2022 she reported having a viral infection with fever and cough. Home COVID testing was negative. Her PCP prescribed Cefdinir x7 days and 20 mg Prednisone BID for 5 days. She continued to feel worse and reported DOE, hypotension to the 90s-100s systolic and as low as 50 diastolic. She was instructed to hold her torsemide for 2 days, and hold her diltiazem until her blood pressure normalized. She followed up with her PCP and had a chest x-ray that was negative for pneumonia.  At her appointment 04/2022 she was recovering from COVID-19.  She was seen 06/2022 after multiple MyChart messages reporting shortness of breath and edema.  She took increasing doses of torsemide with symptomatic improvement.  She saw [name], NP 06/2022 and was doing better.  She is taking torsemide 20 mg with an additional 20 mg as needed.  [name] recommended that she start [place].  BNP has been consistently in the 500s despite titrating torsemide.  GFR has been between 41 and 52.  Echo 08/2022 revealed increasing tricuspid regurgitation, which was now severe.  LVEF was 55-60% with grade 2 diastolic dysfunction.  She had moderate mitral regurgitation.  She was also noted to have aortic atherosclerosis.    ROS: ***  Studies Reviewed: .       Echo 08/2022: IMPRESSIONS     1. Compared with  the echo 04/2020, tricuspid regurgitation has increased  from mild/moderate to severe.   2. Left ventricular ejection fraction, by estimation, is 55 to 60%. The  left ventricle has normal function. The left ventricle has no regional  wall motion abnormalities. There is mild concentric left ventricular  hypertrophy. Left ventricular diastolic  parameters are consistent with Grade II diastolic dysfunction  (pseudonormalization).   3. Right ventricular systolic function is normal.  The right ventricular  size is normal. There is moderately elevated pulmonary artery systolic  pressure.   4. Left atrial size was severely dilated.   5. Right atrial size was severely dilated.   6. Mitral valve regurgitant volume 45 mL. The mitral valve is normal in  structure. Moderate mitral valve regurgitation. No evidence of mitral  stenosis.   7. Tricuspid valve regurgitation is severe.   8. The aortic valve is tricuspid. Aortic valve regurgitation is moderate.  No aortic stenosis is present. Aortic regurgitation PHT measures 351 msec.   9. There is mild (Grade II) atheroma plaque involving the ascending  aorta.  10. The inferior vena cava is dilated in size with >50% respiratory  variability, suggesting right atrial pressure of 8 mmHg.  *** Risk Assessment/Calculations:   {Does this patient have ATRIAL FIBRILLATION?:[phone]} No BP recorded.  {Refresh Note OR Click here to enter BP  :1}***       Physical Exam:   VS:  There were no vitals taken for this visit.   Wt Readings from Last 3 Encounters:  08/23/22 127 lb 8 oz (57.8 kg)  07/28/22 126 lb (57.2 kg)  07/09/22 122 lb (55.3 kg)    GEN: Well nourished, well developed in no acute distress NECK: No JVD; No carotid bruits CARDIAC: ***RRR, no murmurs, rubs, gallops RESPIRATORY:  Clear to auscultation without rales, wheezing or rhonchi  ABDOMEN: Soft, non-tender, non-distended EXTREMITIES:  No edema; No deformity   ASSESSMENT AND PLAN: .   ***    {Are you ordering a CV Procedure (e.g. stress test, cath, DCCV, TEE, etc)?   Press F2        :[phone]}  Dispo: ***  Signed, Chilton Si, MD

## 2022-10-27 NOTE — Patient Instructions (Addendum)
Medication Instructions:  INCREASE BUMEX TO 2 TWICE A DAY STARTING TODAY  *If you need a refill on your cardiac medications before your next appointment, please call your pharmacy*   Lab Work: FRIDAY AFTERNOON 10/29/22 NEED BMET AND PRO BNP (NON-FASTING)  If you have labs (blood work) drawn today and your tests are completely normal, you will receive your results only by: MyChart Message (if you have MyChart) OR A paper copy in the mail If you have any lab test that is abnormal or we need to change your treatment, we will call you to review the results.   Testing/Procedures:     Follow-Up: At [facility], you and your health needs are our priority.  As part of our continuing mission to provide you with exceptional heart care, we have created designated Provider Care Teams.  These Care Teams include your primary Cardiologist (physician) and Advanced Practice Providers (APPs -  Physician Assistants and Nurse Practitioners) who all work together to provide you with the care you need, when you need it.  We recommend signing up for the patient portal called "MyChart".  Sign up information is provided on this After Visit Summary.  MyChart is used to connect with patients for Virtual Visits (Telemedicine).  Patients are able to view lab/test results, encounter notes, upcoming appointments, etc.  Non-urgent messages can be sent to your provider as well.   To learn more about what you can do with MyChart, go to [email or web address].    Your next appointment:  MONDAY 11/01/22 WT 10 AM WITH DR [place]   REFERRAL TO HEART FAILURE CLINIC

## 2022-10-27 NOTE — Progress Notes (Signed)
Cardiology Office Note:  .    Date:  11/01/2022  ID:  Kathryn Gibson, DOB August 29, 1933, MRN [id number] PCP: Chilton Greathouse, MD  [facility] Providers Cardiologist:  Chilton Si, MD Electrophysiologist:  Maurice Small, MD     History of Present Illness: .    Kathryn Gibson is a 89 y.o. female with a hx of atrial fibrillation s/p ablation, SSS s/p PPM, severe tricuspid rotation, moderate mitral regurgitation, aortic atherosclerosis, asymptomatic coronary calcifications, recurrent epistaxis, and hypertension, who presents for follow-up.  Kathryn Gibson was previously a patient of Dr. Patty Sermons.  She underwent ablation for atrial fibrillation on 07/01/15.  She had an episode of atrial fibrillation 12/2015.  Amiodarone was previously increased due to atrial tachycardia.  This was discontinued due to elevated LFTs which subsequently normalized.  She saw Dr. Jacques Navy 07/2018 and reported exertional dyspnea.  Dr. Jacques Navy offered stress testing which she wanted to think about.  Her device was interrogated 01/08/19 and was nearing ERI.  There were no episodes of atrial fibrillation noted.   Her generator was changed out 08/2019.  Afterward she reported feeling poorly and felt like her heart was racing, though is in sinus rhythm in the 90s.  Metoprolol was increased.  She saw Dr. Johney Frame on 05/2020 and was doing well.  Her A. fib burden was at 3.4%.  On her remote check 08/2020 it was 10.6%. She followed up with EP on 11/2020 and they did not make any changes. She had some chest tightness that was thought to be due to her thoracic spine fracture. She had a nuclear stress test 09/2020 that was normal.   She reported some LE edema. BNP was 310. Lasix was increased to 40 mg. She followed up with EP 05/2021 and was doing well with minimal Afib. She was previously on 40 mg of Lasix every day and felt better.  However she had mild renal dysfunction.  We changed her Lasix to 40 mg on Monday, Wednesday, Friday,  and continued with 20 mg on the other days. At her follow-up 12/2021 with EP she was doing well. On 03/10/2022 she reported having a viral infection with fever and cough. Home COVID testing was negative. Her PCP prescribed Cefdinir x7 days and 20 mg Prednisone BID for 5 days. She continued to feel worse and reported DOE, hypotension to the 90s-100s systolic and as low as 50 diastolic. She was instructed to hold her torsemide for 2 days, and hold her diltiazem until her blood pressure normalized. She followed up with her PCP and had a chest x-ray that was negative for pneumonia.  At her appointment 04/2022 she was recovering from COVID-19.  She was seen 06/2022 after multiple MyChart messages reporting shortness of breath and edema.  She took increasing doses of torsemide with symptomatic improvement.  She saw [name], NP 06/2022 and was doing better.  She is taking torsemide 20 mg with an additional 20 mg as needed.  [name] recommended that she start [place].  BNP has been consistently in the 500s despite titrating torsemide.  GFR has been between 41 and 52.  Echo 08/2022 revealed increasing tricuspid regurgitation, which was now severe.  LVEF was 55-60% with grade 2 diastolic dysfunction.  She had moderate mitral regurgitation.  She was also noted to have aortic atherosclerosis.  Today, she is accompanied by a family member. One of her main concerns today is struggling with shortness of breath on slight exertion. She is unable to walk as far. She  complains of orthopnea as well. Performs breathing exercises which seems to help somewhat. However, she states that she is not breathing as deeply or completely. She has noticed some wheezing as well. She had lost some weight during a recent illness, at one point down to 117 lbs. Since then her weight increased to 119 and 121 lbs, and was stable. Recently she has noticed a weight gain of about 2 lbs. She complains of LE swelling that was previously localized to her  ankles, but has now started appearing in her legs as well and is worse at night. Also states that her abdomen has felt swollen. She has been taking her Bumex daily but still with normal urine production. Especially after bending over, she feels like she has increased pressure in her face as if her blood pressure is increasing. She has measured her BP in the 110s-120s/50s-60s at home. In the office her blood pressure is 126/72. Typically she knows when she is in Afib. She denies any palpitations recently. She takes her medications after breakfast, around 9-10 AM. She denies any chest pain, lightheadedness, headaches, syncope, or PND.  ROS:  Please see the history of present illness. All other systems are reviewed and negative.  (+) Shortness of breath (+) Orthopnea (+) Wheezing (+) BLE edema (+) Abdominal swelling (+) Weight gain  Studies Reviewed: .        Echo  08/17/22:  1. Compared with the echo 04/2020, tricuspid regurgitation has increased  from mild/moderate to severe.   2. Left ventricular ejection fraction, by estimation, is 55 to 60%. The  left ventricle has normal function. The left ventricle has no regional  wall motion abnormalities. There is mild concentric left ventricular  hypertrophy. Left ventricular diastolic  parameters are consistent with Grade II diastolic dysfunction  (pseudonormalization).   3. Right ventricular systolic function is normal. The right ventricular  size is normal. There is moderately elevated pulmonary artery systolic  pressure.   4. Left atrial size was severely dilated.   5. Right atrial size was severely dilated.   6. Mitral valve regurgitant volume 45 mL. The mitral valve is normal in  structure. Moderate mitral valve regurgitation. No evidence of mitral  stenosis.   7. Tricuspid valve regurgitation is severe.   8. The aortic valve is tricuspid. Aortic valve regurgitation is moderate.  No aortic stenosis is present. Aortic regurgitation PHT  measures 351 msec.   9. There is mild (Grade II) atheroma plaque involving the ascending  aorta.  10. The inferior vena cava is dilated in size with >50% respiratory  variability, suggesting right atrial pressure of 8 mmHg.   Risk Assessment/Calculations:    CHA2DS2-VASc Score =     This indicates a  % annual risk of stroke. The patient's score is based upon:          Physical Exam:    VS:  BP 126/72 (BP Location: Left Arm, Patient Position: Sitting, Cuff Size: Normal)   Pulse 61   Ht 5' (1.524 m)   Wt 124 lb 11.2 oz (56.6 kg)   BMI 24.35 kg/m  , BMI Body mass index is 24.35 kg/m. GENERAL:  Well appearing HEENT: Pupils equal round and reactive, fundi not visualized, oral mucosa unremarkable NECK:  + jugular venous distention, waveform within normal limits, carotid upstroke brisk and symmetric, no bruits, no thyromegaly LYMPHATICS:  No cervical adenopathy LUNGS:  Clear to auscultation bilaterally HEART:  RRR.  PMI not displaced or sustained,S1 and S2 within normal limits,  no S3, no S4, no clicks, no rubs, III/VI systolic murmurs ABD:  Flat, positive bowel sounds normal in frequency in pitch, no bruits, no rebound, no guarding, no midline pulsatile mass, no hepatomegaly, no splenomegaly EXT:  2 plus pulses throughout, + LE edema bilaterally to the upper tibia bilaterally, no cyanosis no clubbing SKIN:  No rashes no nodules NEURO:  Cranial nerves II through XII grossly intact, motor grossly intact throughout PSYCH:  Cognitively intact, oriented to person place and time  Wt Readings from Last 3 Encounters:  10/27/22 124 lb 11.2 oz (56.6 kg)  08/23/22 127 lb 8 oz (57.8 kg)  07/28/22 126 lb (57.2 kg)     ASSESSMENT AND PLAN: .    # HFpEF: # Severe tricuspid regurgitation: # Moderate aortic regurgitation: # Moderate mitral regurgitation: Worsening shortness of breath, exertional intolerance, and peripheral edema. Weight gain of approximately 2 pounds. Severe tricuspid  regurgitation noted on recent echocardiogram. Current diuretic therapy insufficient. -Increase Bumex to 2mg  twice daily. -Check BMP on 10/29/2022 to monitor renal function and BNP. -Consider hospital admission for IV diuresis if no improvement.  Patient prefers to try outpatient management. -Continue Marcelline Deist -Schedule appointment with heart failure team for further management.  # Atrial Fibrillation Well controlled on current regimen.  Currently AP/VP. -Continue flecainide, Diltiazem and Metoprolol. -Continue anticoagulation with Xarelto.  # Aortic atherosclerosis:  # Hyperlipidemia: Continue pravastatin  Follow-up Return to clinic on 11/01/2022 for reassessment of symptoms and fluid status.             Dispo:  FU with Merna Baldi C. Duke Salvia, MD, [facility] on Monday, 9/23.  I,Mathew Stumpf,acting as a Neurosurgeon for Chilton Si, MD.,have documented all relevant documentation on the behalf of Chilton Si, MD,as directed by  Chilton Si, MD while in the presence of Chilton Si, MD.  I, Trystyn Dolley C. Duke Salvia, MD have reviewed all documentation for this visit.  The documentation of the exam, diagnosis, procedures, and orders on 11/01/2022 are all accurate and complete.   Signed, Chilton Si, MD

## 2022-10-28 ENCOUNTER — Ambulatory Visit (HOSPITAL_BASED_OUTPATIENT_CLINIC_OR_DEPARTMENT_OTHER): Payer: Medicare Other

## 2022-10-28 ENCOUNTER — Other Ambulatory Visit (HOSPITAL_BASED_OUTPATIENT_CLINIC_OR_DEPARTMENT_OTHER): Payer: Self-pay

## 2022-10-28 ENCOUNTER — Encounter (HOSPITAL_BASED_OUTPATIENT_CLINIC_OR_DEPARTMENT_OTHER): Payer: Self-pay

## 2022-10-28 DIAGNOSIS — R2689 Other abnormalities of gait and mobility: Secondary | ICD-10-CM | POA: Diagnosis not present

## 2022-10-28 DIAGNOSIS — M6281 Muscle weakness (generalized): Secondary | ICD-10-CM

## 2022-10-28 NOTE — Therapy (Signed)
OUTPATIENT PHYSICAL THERAPY LOWER EXTREMITY TREATMENT   Patient Name: Kathryn Gibson MRN: [id number] DOB:August 23, 1933, 89 y.o., female Today's Date: 10/28/2022  END OF SESSION:  PT End of Session - 10/28/22 1527     Visit Number 10    Number of Visits 16    Date for PT Re-Evaluation 11/09/22    PT Start Time 1345    PT Stop Time 1430    PT Time Calculation (min) 45 min    Activity Tolerance Treatment limited secondary to medical complications (Comment)   SOB                  Past Medical History:  Diagnosis Date   Arthritis    Atrial fibrillation (HCC)    Atypical chest pain 09/22/2020   Blood transfusion without reported diagnosis    2017   Bradycardia    s/p PPM   CAD in native artery 03/26/2021   Cataract    Chronic diastolic heart failure (HCC) 03/26/2021   Colonic polyp    adenomatous   Constipation    Diverticulosis    Hemorrhoids    Hyperlipidemia    Hypertension    Hypothyroidism    Irritable bowel syndrome (IBS)    Lower extremity edema 09/22/2020   Osteoporosis    osteopenia   Pacemaker 2010   UTI (urinary tract infection)    Past Surgical History:  Procedure Laterality Date   BREAST CYST ASPIRATION Left 1980s   COLONOSCOPY     ELECTROPHYSIOLOGIC STUDY N/A 07/01/2015   Procedure: Atrial Fibrillation Ablation;  Surgeon: Hillis Range, MD;  Location: [facility] INVASIVE CV LAB;  Service: Cardiovascular;  Laterality: N/A;   EYE SURGERY     heart ablation     for atrial fib   HEMATOMA EVACUATION Right 07/15/2014   Procedure: EVACUATION Right Groin Hematoma;  Surgeon: Larina Earthly, MD;  Location: [facility] OR;  Service: Vascular;  Laterality: Right;   NASAL ENDOSCOPY WITH EPISTAXIS CONTROL N/A 07/14/2014   Procedure: NASAL ENDOSCOPY WITH EPISTAXIS CONTROL;  Surgeon: Melvenia Beam, MD;  Location: [facility] OR;  Service: ENT;  Laterality: N/A;   NASAL ENDOSCOPY WITH EPISTAXIS CONTROL Bilateral 07/15/2014   Procedure: NASAL ENDOSCOPY WITH EPISTAXIS CONTROL;  Surgeon: Melvenia Beam, MD;  Location: [facility] OR;  Service: ENT;  Laterality: Bilateral;   PACEMAKER INSERTION  2010   implanted by Dr Deborah Chalk (MDT)   PPM GENERATOR CHANGEOUT N/A 08/16/2019   Procedure: PPM GENERATOR CHANGEOUT;  Surgeon: Hillis Range, MD;  Location: [facility] INVASIVE CV LAB;  Service: Cardiovascular;  Laterality: N/A;   RADIOLOGY WITH ANESTHESIA N/A 07/14/2014   Procedure: RADIOLOGY WITH ANESTHESIA;  Surgeon: Medication Radiologist, MD;  Location: [facility] OR;  Service: Radiology;  Laterality: N/A;   TEE WITHOUT CARDIOVERSION N/A 07/01/2015   Procedure: TRANSESOPHAGEAL ECHOCARDIOGRAM (TEE);  Surgeon: Quintella Reichert, MD;  Location[facility] ENDOSCOPY;  Service: Cardiovascular;  Laterality: N/A;   Patient Active Problem List   Diagnosis Date Noted   Chronic diastolic heart failure (HCC) 03/26/2021   CAD in native artery 03/26/2021   Atypical chest pain 09/22/2020   Lower extremity edema 09/22/2020   Genital herpes simplex 09/18/2019   Fatigue 06/08/2018   [place] 12/14/2016   Rectal pain 11/12/2016   Ptosis of eyelid 04/05/2016   Hypokalemia    Acute blood loss anemia    Hypomagnesemia    Chronic atrial fibrillation (HCC)    Essential hypertension    Other specified hypothyroidism    Posterior epistaxis    Hematoma complicating  a procedure    Diastolic dysfunction 07/14/2014   Epistaxis, recurrent 07/14/2014   Sick sinus syndrome (HCC) 04/29/2014   Encounter for therapeutic drug monitoring 04/04/2013   Sciatica of left side 11/22/2011   Aortic valve insufficiency 03/25/2011   Dyspnea 11/24/2010   Essential hypertension, benign 04/18/2010   Hypothyroidism 04/15/2010   PALPITATIONS 04/15/2010   BRADYCARDIA-TACHYCARDIA SYNDROME 12/11/2008   IRRITABLE BOWEL SYNDROME 12/11/2008   COLONIC POLYPS, ADENOMATOUS, HX OF 12/11/2008   CONSTIPATION 11/13/2008   ABDOMINAL BLOATING 11/13/2008   COAGULOPATHY 07/03/2008   Constipation 07/03/2008   CHANGE IN BOWELS 07/03/2008   UNSPECIFIED DISORDER OF THYROID  07/02/2008   DIVERTICULOSIS, COLON 07/02/2008    PCP: Dr Chilton Greathouse   REFERRING PROVIDER: [name] NP  REFERRING DIAG:   Diagnosis  R53.81 (ICD-10-CM) - Physical deconditioning    THERAPY DIAG:  Other abnormalities of gait and mobility  Muscle weakness (generalized)  Rationale for Evaluation and Treatment: Rehabilitation  ONSET DATE: Around the pandemic she began having shortness of breath   SUBJECTIVE:   SUBJECTIVE STATEMENT: Pt saw cardiologist yesterday who adjusted her medication. She had bene experienced severe SOB last week. This is better today and pt has follow up appt with her again Monday. Has blood work on Friday. "I don't know how much I'll be able to do today."  PERTINENT HISTORY: A-fib, Chest pain 2022, lower extremity swelling, Pacemaker; Osteopenia,  PAIN:  Are you having pain?  General arthritis that comes and goes.   PRECAUTIONS: Pacemaker   RED FLAGS: None   WEIGHT BEARING RESTRICTIONS: No  FALLS:  Has patient fallen in last 6 months? No  LIVING ENVIRONMENT: Has steps she can walk but she doesn't do them much OCCUPATION:  Retired  Risk manager:  The pool, Walking, reading, going ot to dinner.   PLOF: Independent Uses a walker at this time for longer distances   PATIENT GOALS:  To get the strength in her legs back   NEXT MD VISIT: Nothing scheduled.   OBJECTIVE:   DIAGNOSTIC FINDINGS:  Nothing   PATIENT SURVEYS:  FOTO    COGNITION: Overall cognitive status: Within functional limits for tasks assessed     SENSATION: WFL  EDEMA:  Bilateral LE edema controlled with ted hose and diuretics  MUSCLE LENGTH: POSTURE: No Significant postural limitations  PALPATION: No tenderness to plapation   LOWER EXTREMITY ROM:  Active ROM Right eval Left eval  Hip flexion    Hip extension    Hip abduction    Hip adduction    Hip internal rotation    Hip external rotation    Knee flexion    Knee extension    Ankle  dorsiflexion    Ankle plantarflexion    Ankle inversion    Ankle eversion     (Blank rows = not tested) within normal limits  LOWER EXTREMITY MMT:  MMT Right eval Left eval  Hip flexion 15.5 14.8  Hip extension    Hip abduction 25.3 26.8  Hip adduction    Hip internal rotation    Hip external rotation    Knee flexion    Knee extension 23.9 18.5  Ankle dorsiflexion    Ankle plantarflexion    Ankle inversion    Ankle eversion     (Blank rows = not tested) WNL  LOWER EXTREMITY SPECIAL TESTS:   FUNCTIONAL TESTS:  6 minute walk test:   5x sit to stand 13 sec  Base line HR 75 bpm  GAIT: Uses walker but does  not put much weight on the walker   TODAY'S TREATMENT:                                                                                                                              DATE:     9/19:  Seated march 2x30 GTB Seated hip abduction 2x30GTB Seated LAQ 2 x 15 each leg 3#   Bilateral bicep curl 2 pounds 2 x 20 Bilateral punch 2 pounds 2 x 20 TB row seated- 2x20 GTB  Sit to stands 3x10  9/12 5 min L4  Hurdles: 4 hurdles step over no UE support with CGA/SBA x4 laps 4 hurdles lateral 4 laps   Romberg stance on airex with EC- 2x30sec  Step ups onto airex 2x10 Sit to stands x20  Gait without walker x221ft  Rhomberg with reaching across body- standing on airex x10ea  9/10 5 min L3   Hurdles: 5 hurdles step over no UE support with CGA x4 laps 5 hurdles backwards/ forwards 4 laps   Gait:  Long strides 100'  March 100'   Walk with head turns 50' Walk with head nod 50'  CGA -> min a with all   UE:   Bicpes curls 2 lbs 12x10 Punch 2 lbs 2x15  Press 2x15 2lbs       9/6  Interval 1: SaO2 96% Interval 2: SaO2 96% Interval 3: SaO2 94%.  2 min intervals L5, switched to L4 after  Hurdles: 4 hurdles step over no UE support with CGA x4 laps  Lateral hurdle step overs x4 hurdles x4 laps- no UE support, CGA   Air-ex step on  and off x20ach leg, SBA no UE support  Romberg stance on airex 3x 30seconds  HR/TR on airex at rail x20  Step up 6 inch 2x 10 each leg Lateral step up 4 inch 2x 10 each leg   PATIENT EDUCATION:  Education details: HEP, symptom management, progression of activity  Person educated: Patient Education method: Explanation, Demonstration, Tactile cues, Verbal cues, and Handouts Education comprehension: verbalized understanding, returned demonstration, verbal cues required, tactile cues required, and needs further education  HOME EXERCISE PROGRAM: Access Code: [email or web address] URL: [email or web address] Date: 09/14/2022 Prepared by: Lorayne Bender  Exercises - Seated Knee Extension with Resistance  - 1 x daily - 7 x weekly - 3 sets - 15 reps - Seated Hip Abduction with Resistance  - 1 x daily - 7 x weekly - 3 sets - 15 reps - Seated Knee Lifts with Resistance  - 1 x daily - 7 x weekly - 3 sets - 10 reps  ASSESSMENT:  CLINICAL IMPRESSION: Pt was limited by SOB today and requested to perform seated tasks only. Pt demonstrated excellent effort with these and experienced no significant SOB. Advised pt to modify her activity level and HEP as needed while her doctors work on plan for medication and next steps with her heart failure. Will monitor pt's symptoms as  we continue with PT. Will await MD appt on Monday with cardiologist.   OBJECTIVE IMPAIRMENTS: cardiopulmonary status limiting activity, difficulty walking, decreased strength, and impaired perceived functional ability.   ACTIVITY LIMITATIONS: standing, stairs, and locomotion level  PARTICIPATION LIMITATIONS: meal prep, cleaning, laundry, shopping, and community activity  PERSONAL FACTORS: Age and 1 comorbidity: multiple cardiovascular issues  are also affecting patient's functional outcome.   REHAB POTENTIAL: Good  CLINICAL DECISION MAKING: Evolving/moderate complexity  EVALUATION COMPLEXITY: Moderate   GOALS: Goals  reviewed with patient? Yes  SHORT TERM GOALS: Target date: 10/12/2022       Patient will increase gross bilateral lower extremity strength by 5 pounds Baseline: Goal status: INITIAL  2.  Patient will increase 6-minute walk test distance by 300 feet and reduce rest breaks by 1 Baseline:  Goal status: INITIAL  3.  Patient will decrease time on 5 times sit to stand test by 3 seconds Baseline:  Goal status: INITIAL  4.  Patient will be independent with base exercise program Baseline:  Goal status: INITIAL   LONG TERM GOALS: Target date:  11/09/2022    Patient will walk to her meal hall without increased dyspnea Baseline:  Goal status: INITIAL  2.  Patient will go up and down steps without increase shortness of breath in order to improve community ambulation Baseline:  Goal status: INITIAL  3.  Patient will be able to go to the pool and back without significant fatigue Baseline:  Goal status: INITIAL  4.    PLAN:  PT FREQUENCY: 1-2x/week  PT DURATION: 8 weeks  PLANNED INTERVENTIONS: Therapeutic exercises, Therapeutic activity, Neuromuscular re-education, Balance training, Gait training, Patient/Family education, Self Care, Stair training, DME instructions, Aquatic Therapy; Electrical stimulation, Cryotherapy, Moist heat, Taping, Manual therapy, and Re-evaluation.   PLAN FOR NEXT SESSION:  Consider light stair training; consider intervals on nu-step or bike; monitor HR. Patient can ambualte about 1:30 min wouout a rest break. Work on increasing her time.  Consider gross exercise program.  Patient already has a strong home program to work on.   Donnel Saxon Elain Wixon, PTA 10/28/2022, 3:32 PM

## 2022-10-29 DIAGNOSIS — I2510 Atherosclerotic heart disease of native coronary artery without angina pectoris: Secondary | ICD-10-CM | POA: Diagnosis not present

## 2022-10-29 DIAGNOSIS — I10 Essential (primary) hypertension: Secondary | ICD-10-CM | POA: Diagnosis not present

## 2022-10-29 DIAGNOSIS — I482 Chronic atrial fibrillation, unspecified: Secondary | ICD-10-CM | POA: Diagnosis not present

## 2022-10-29 DIAGNOSIS — I351 Nonrheumatic aortic (valve) insufficiency: Secondary | ICD-10-CM | POA: Diagnosis not present

## 2022-10-30 LAB — BASIC METABOLIC PANEL
BUN/Creatinine Ratio: 25 (ref 12–28)
BUN: 25 mg/dL (ref 8–27)
CO2: 27 mmol/L (ref 20–29)
Calcium: 9.5 mg/dL (ref 8.7–10.3)
Chloride: 92 mmol/L — ABNORMAL LOW (ref 96–106)
Creatinine, Ser: 1 mg/dL (ref 0.57–1.00)
Glucose: 106 mg/dL — ABNORMAL HIGH (ref 70–99)
Potassium: 3.9 mmol/L (ref 3.5–5.2)
Sodium: 135 mmol/L (ref 134–144)
eGFR: 55 mL/min/{1.73_m2} — ABNORMAL LOW (ref >59)

## 2022-11-01 ENCOUNTER — Encounter (HOSPITAL_BASED_OUTPATIENT_CLINIC_OR_DEPARTMENT_OTHER): Payer: Self-pay | Admitting: Cardiovascular Disease

## 2022-11-01 ENCOUNTER — Encounter (HOSPITAL_BASED_OUTPATIENT_CLINIC_OR_DEPARTMENT_OTHER): Payer: Self-pay

## 2022-11-01 ENCOUNTER — Ambulatory Visit (HOSPITAL_BASED_OUTPATIENT_CLINIC_OR_DEPARTMENT_OTHER): Payer: Medicare Other

## 2022-11-01 ENCOUNTER — Ambulatory Visit (HOSPITAL_BASED_OUTPATIENT_CLINIC_OR_DEPARTMENT_OTHER): Payer: Medicare Other | Admitting: Cardiovascular Disease

## 2022-11-01 VITALS — BP 100/50 | HR 60 | Ht 60.0 in | Wt 121.0 lb

## 2022-11-01 DIAGNOSIS — I10 Essential (primary) hypertension: Secondary | ICD-10-CM | POA: Diagnosis not present

## 2022-11-01 DIAGNOSIS — I5032 Chronic diastolic (congestive) heart failure: Secondary | ICD-10-CM

## 2022-11-01 DIAGNOSIS — R2689 Other abnormalities of gait and mobility: Secondary | ICD-10-CM

## 2022-11-01 DIAGNOSIS — I495 Sick sinus syndrome: Secondary | ICD-10-CM

## 2022-11-01 DIAGNOSIS — I482 Chronic atrial fibrillation, unspecified: Secondary | ICD-10-CM

## 2022-11-01 DIAGNOSIS — M6281 Muscle weakness (generalized): Secondary | ICD-10-CM

## 2022-11-01 DIAGNOSIS — I2510 Atherosclerotic heart disease of native coronary artery without angina pectoris: Secondary | ICD-10-CM | POA: Diagnosis not present

## 2022-11-01 NOTE — Therapy (Signed)
OUTPATIENT PHYSICAL THERAPY LOWER EXTREMITY TREATMENT   Patient Name: Kathryn Gibson MRN: [id number] DOB:1933/03/06, 89 y.o., female Today's Date: 11/01/2022  END OF SESSION:  PT End of Session - 11/01/22 1308     Visit Number 11    Number of Visits 16    Date for PT Re-Evaluation 11/09/22    PT Start Time 1304    PT Stop Time 1345    PT Time Calculation (min) 41 min    Equipment Utilized During Treatment Gait belt    Activity Tolerance Patient tolerated treatment well    Behavior During Therapy [facility] for tasks assessed/performed                    Past Medical History:  Diagnosis Date   Arthritis    Atrial fibrillation (HCC)    Atypical chest pain 09/22/2020   Blood transfusion without reported diagnosis    2017   Bradycardia    s/p PPM   CAD in native artery 03/26/2021   Cataract    Chronic diastolic heart failure (HCC) 03/26/2021   Colonic polyp    adenomatous   Constipation    Diverticulosis    Hemorrhoids    Hyperlipidemia    Hypertension    Hypothyroidism    Irritable bowel syndrome (IBS)    Lower extremity edema 09/22/2020   Osteoporosis    osteopenia   Pacemaker 2010   UTI (urinary tract infection)    Past Surgical History:  Procedure Laterality Date   BREAST CYST ASPIRATION Left 1980s   COLONOSCOPY     ELECTROPHYSIOLOGIC STUDY N/A 07/01/2015   Procedure: Atrial Fibrillation Ablation;  Surgeon: Hillis Range, MD;  Location: [facility] INVASIVE CV LAB;  Service: Cardiovascular;  Laterality: N/A;   EYE SURGERY     heart ablation     for atrial fib   HEMATOMA EVACUATION Right 07/15/2014   Procedure: EVACUATION Right Groin Hematoma;  Surgeon: Larina Earthly, MD;  Location: [facility] OR;  Service: Vascular;  Laterality: Right;   NASAL ENDOSCOPY WITH EPISTAXIS CONTROL N/A 07/14/2014   Procedure: NASAL ENDOSCOPY WITH EPISTAXIS CONTROL;  Surgeon: Melvenia Beam, MD;  Location: [facility] OR;  Service: ENT;  Laterality: N/A;   NASAL ENDOSCOPY WITH EPISTAXIS CONTROL Bilateral  07/15/2014   Procedure: NASAL ENDOSCOPY WITH EPISTAXIS CONTROL;  Surgeon: Melvenia Beam, MD;  Location: [facility] OR;  Service: ENT;  Laterality: Bilateral;   PACEMAKER INSERTION  2010   implanted by Dr Deborah Chalk (MDT)   PPM GENERATOR CHANGEOUT N/A 08/16/2019   Procedure: PPM GENERATOR CHANGEOUT;  Surgeon: Hillis Range, MD;  Location: [facility] INVASIVE CV LAB;  Service: Cardiovascular;  Laterality: N/A;   RADIOLOGY WITH ANESTHESIA N/A 07/14/2014   Procedure: RADIOLOGY WITH ANESTHESIA;  Surgeon: Medication Radiologist, MD;  Location: [facility] OR;  Service: Radiology;  Laterality: N/A;   TEE WITHOUT CARDIOVERSION N/A 07/01/2015   Procedure: TRANSESOPHAGEAL ECHOCARDIOGRAM (TEE);  Surgeon: Quintella Reichert, MD;  Location: [facility] ENDOSCOPY;  Service: Cardiovascular;  Laterality: N/A;   Patient Active Problem List   Diagnosis Date Noted   Chronic diastolic heart failure (HCC) 03/26/2021   CAD in native artery 03/26/2021   Atypical chest pain 09/22/2020   Lower extremity edema 09/22/2020   Genital herpes simplex 09/18/2019   Fatigue 06/08/2018   [place] 12/14/2016   Rectal pain 11/12/2016   Ptosis of eyelid 04/05/2016   Hypokalemia    Acute blood loss anemia    Hypomagnesemia    Chronic atrial fibrillation (HCC)    Essential hypertension  Other specified hypothyroidism    Posterior epistaxis    Hematoma complicating a procedure    Diastolic dysfunction 07/14/2014   Epistaxis, recurrent 07/14/2014   Sick sinus syndrome (HCC) 04/29/2014   Encounter for therapeutic drug monitoring 04/04/2013   Sciatica of left side 11/22/2011   Aortic valve insufficiency 03/25/2011   Dyspnea 11/24/2010   Essential hypertension, benign 04/18/2010   Hypothyroidism 04/15/2010   PALPITATIONS 04/15/2010   BRADYCARDIA-TACHYCARDIA SYNDROME 12/11/2008   IRRITABLE BOWEL SYNDROME 12/11/2008   COLONIC POLYPS, ADENOMATOUS, HX OF 12/11/2008   CONSTIPATION 11/13/2008   ABDOMINAL BLOATING 11/13/2008   COAGULOPATHY 07/03/2008   Constipation  07/03/2008   CHANGE IN BOWELS 07/03/2008   UNSPECIFIED DISORDER OF THYROID 07/02/2008   DIVERTICULOSIS, COLON 07/02/2008    PCP: Dr Chilton Greathouse   REFERRING PROVIDER: [name] NP  REFERRING DIAG:   Diagnosis  R53.81 (ICD-10-CM) - Physical deconditioning    THERAPY DIAG:  Other abnormalities of gait and mobility  Muscle weakness (generalized)  Rationale for Evaluation and Treatment: Rehabilitation  ONSET DATE: Around the pandemic she began having shortness of breath   SUBJECTIVE:   SUBJECTIVE STATEMENT: Pt had f/u with cardiologist today. Sates she has been feeling less SOB since the change in her medication last week. "My legs get really tired though." Pt reported her BP was low this morning. Denies dizziness/light headedness.   PERTINENT HISTORY: A-fib, Chest pain 2022, lower extremity swelling, Pacemaker; Osteopenia,  PAIN:  Are you having pain?  General arthritis that comes and goes.   PRECAUTIONS: Pacemaker   RED FLAGS: None   WEIGHT BEARING RESTRICTIONS: No  FALLS:  Has patient fallen in last 6 months? No  LIVING ENVIRONMENT: Has steps she can walk but she doesn't do them much OCCUPATION:  Retired  Risk manager:  The pool, Walking, reading, going ot to dinner.   PLOF: Independent Uses a walker at this time for longer distances   PATIENT GOALS:  To get the strength in her legs back   NEXT MD VISIT: Nothing scheduled.   OBJECTIVE:   DIAGNOSTIC FINDINGS:  Nothing   PATIENT SURVEYS:  FOTO    COGNITION: Overall cognitive status: Within functional limits for tasks assessed     SENSATION: WFL  EDEMA:  Bilateral LE edema controlled with ted hose and diuretics  MUSCLE LENGTH: POSTURE: No Significant postural limitations  PALPATION: No tenderness to plapation   LOWER EXTREMITY ROM:  Active ROM Right eval Left eval  Hip flexion    Hip extension    Hip abduction    Hip adduction    Hip internal rotation    Hip external rotation     Knee flexion    Knee extension    Ankle dorsiflexion    Ankle plantarflexion    Ankle inversion    Ankle eversion     (Blank rows = not tested) within normal limits  LOWER EXTREMITY MMT:  MMT Right eval Left eval  Hip flexion 15.5 14.8  Hip extension    Hip abduction 25.3 26.8  Hip adduction    Hip internal rotation    Hip external rotation    Knee flexion    Knee extension 23.9 18.5  Ankle dorsiflexion    Ankle plantarflexion    Ankle inversion    Ankle eversion     (Blank rows = not tested) WNL  LOWER EXTREMITY SPECIAL TESTS:   FUNCTIONAL TESTS:  6 minute walk test:   5x sit to stand 13 sec; 9/23: 5xSTS: 8 seconds Base line  HR 75 bpm  GAIT: Uses walker but does not put much weight on the walker   TODAY'S TREATMENT:                                                                                                                              DATE:    9/23:  BP 107/62 (normal per pt) Nu step 5 min L3 Seated LAQ 2 x 15 each leg 3# Sit to stands 3x10 5xSTS test Gait with walker x275ft Romberg stance on airex with head turns/nods- 2x30sec ea Light CGA  Step ups onto airex 2x10 Step ups 6" 2x5ea Hurdle step overs fwd- 4 hurdles x3 laps with and without hand rail. CGA)  9/19:  Seated march 2x30 GTB Seated hip abduction 2x30GTB Seated LAQ 2 x 15 each leg 3#   Bilateral bicep curl 2 pounds 2 x 20 Bilateral punch 2 pounds 2 x 20 TB row seated- 2x20 GTB  Sit to stands 3x10  9/12 5 min L4  Hurdles: 4 hurdles step over no UE support with CGA/SBA x4 laps 4 hurdles lateral 4 laps   Romberg stance on airex with EC- 2x30sec  Step ups onto airex 2x10 Sit to stands x20  Gait without walker x222ft  Rhomberg with reaching across body- standing on airex x10ea  9/10 5 min L3   Hurdles: 5 hurdles step over no UE support with CGA x4 laps 5 hurdles backwards/ forwards 4 laps   Gait:  Long strides 100'  March 100'   Walk with head turns 50' Walk  with head nod 50'  CGA -> min a with all   UE:   Bicpes curls 2 lbs 12x10 Punch 2 lbs 2x15  Press 2x15 2lbs   PATIENT EDUCATION:  Education details: HEP, symptom management, progression of activity  Person educated: Patient Education method: Explanation, Demonstration, Tactile cues, Verbal cues, and Handouts Education comprehension: verbalized understanding, returned demonstration, verbal cues required, tactile cues required, and needs further education  HOME EXERCISE PROGRAM: Access Code: [email or web address] URL: [email or web address] Date: 09/14/2022 Prepared by: Lorayne Bender  Exercises - Seated Knee Extension with Resistance  - 1 x daily - 7 x weekly - 3 sets - 15 reps - Seated Hip Abduction with Resistance  - 1 x daily - 7 x weekly - 3 sets - 15 reps - Seated Knee Lifts with Resistance  - 1 x daily - 7 x weekly - 3 sets - 10 reps  ASSESSMENT:  CLINICAL IMPRESSION: Improved energy level today. Able to performed nu-step and standing activities with significant improvement. Gait training was performed with walker with minimal SOB. Difficulty with hurdles without rail assist, though she was able to complete step to pattern with CGA. Pt significantly improved on 5xSTS today, meeting goal.  Will continue to progress as tolerated.   OBJECTIVE IMPAIRMENTS: cardiopulmonary status limiting activity, difficulty walking, decreased strength, and impaired perceived functional ability.   ACTIVITY LIMITATIONS: standing, stairs, and locomotion level  PARTICIPATION LIMITATIONS: meal prep, cleaning, laundry, shopping, and community activity  PERSONAL FACTORS: Age and 1 comorbidity: multiple cardiovascular issues  are also affecting patient's functional outcome.   REHAB POTENTIAL: Good  CLINICAL DECISION MAKING: Evolving/moderate complexity  EVALUATION COMPLEXITY: Moderate   GOALS: Goals reviewed with patient? Yes  SHORT TERM GOALS: Target date: 10/12/2022       Patient will  increase gross bilateral lower extremity strength by 5 pounds Baseline: Goal status: INITIAL  2.  Patient will increase 6-minute walk test distance by 300 feet and reduce rest breaks by 1 Baseline:  Goal status: INITIAL  3.  Patient will decrease time on 5 times sit to stand test by 3 seconds Baseline:  Goal status: MET 9/23  4.  Patient will be independent with base exercise program Baseline:  Goal status: MET 9/23   LONG TERM GOALS: Target date:  11/09/2022    Patient will walk to her meal hall without increased dyspnea Baseline:  Goal status: Ongoing SOB with short distances without walker 9/23  2.  Patient will go up and down steps without increase shortness of breath in order to improve community ambulation Baseline:  Goal status: Pt has not tried stairs since she has been using her walker -9/23  3.  Patient will be able to go to the pool and back without significant fatigue Baseline:  Goal status: IN PROGRESS (remains significantly fatigued with this per pt) 9/23  4.    PLAN:  PT FREQUENCY: 1-2x/week  PT DURATION: 8 weeks  PLANNED INTERVENTIONS: Therapeutic exercises, Therapeutic activity, Neuromuscular re-education, Balance training, Gait training, Patient/Family education, Self Care, Stair training, DME instructions, Aquatic Therapy; Electrical stimulation, Cryotherapy, Moist heat, Taping, Manual therapy, and Re-evaluation.   PLAN FOR NEXT SESSION:  Consider light stair training; consider intervals on nu-step or bike; monitor HR. Patient can ambualte about 1:30 min wouout a rest break. Work on increasing her time.  Consider gross exercise program.  Patient already has a strong home program to work on.   Donnel Saxon Harvin Konicek, PTA 11/01/2022, 1:55 PM

## 2022-11-01 NOTE — Progress Notes (Signed)
Cardiology Office Note:  .   Date:  11/01/2022  ID:  Kathryn Gibson, DOB [birth year], MRN [id number] PCP: Chilton Greathouse, MD  [facility] Providers Cardiologist:  Chilton Si, MD Electrophysiologist:  Maurice Small, MD    History of Present Illness: .   Kathryn Gibson is a 89 y.o. female with a hx of atrial fibrillation s/p ablation, SSS s/p PPM, severe tricuspid regurgitation, moderate mitral regurgitation, aortic atherosclerosis, asymptomatic coronary calcifications, recurrent epistaxis, and hypertension, who presents for follow-up.  [name] was previously a patient of Dr. Patty Sermons.  She underwent ablation for atrial fibrillation on 07/01/15.  She had an episode of atrial fibrillation 12/2015.  Amiodarone was previously increased due to atrial tachycardia.  This was discontinued due to elevated LFTs which subsequently normalized.  She saw Dr. Jacques Navy 07/2018 and reported exertional dyspnea.  Dr. Jacques Navy offered stress testing which she wanted to think about.  Her device was interrogated 01/08/19 and was nearing ERI.  There were no episodes of atrial fibrillation noted.   Her generator was changed out 08/2019.  Afterward she reported feeling poorly and felt like her heart was racing, though is in sinus rhythm in the 90s.  Metoprolol was increased.  She saw Dr. Johney Frame on 05/2020 and was doing well.  Her A. fib burden was at 3.4%.  On her remote check 08/2020 it was 10.6%. She followed up with EP on 11/2020 and they did not make any changes. She had some chest tightness that was thought to be due to her thoracic spine fracture. She had a nuclear stress test 09/2020 that was normal.   She reported some LE edema. BNP was 310. Lasix was increased to 40 mg. She followed up with EP 05/2021 and was doing well with minimal Afib. She was previously on 40 mg of Lasix every day and felt better.  However she had mild renal dysfunction.  We changed her Lasix to 40 mg on Monday, Wednesday, Friday, and  continued with 20 mg on the other days. At her follow-up 12/2021 with EP she was doing well. On 03/10/2022 she reported having a viral infection with fever and cough. Home COVID testing was negative. Her PCP prescribed Cefdinir x7 days and 20 mg Prednisone BID for 5 days. She continued to feel worse and reported DOE, hypotension to the 90s-100s systolic and as low as 50 diastolic. She was instructed to hold her torsemide for 2 days, and hold her diltiazem until her blood pressure normalized. She followed up with her PCP and had a chest x-ray that was negative for pneumonia.  At her appointment 04/2022 she was recovering from COVID-19.  She was seen 06/2022 after multiple MyChart messages reporting shortness of breath and edema.  She took increasing doses of torsemide with symptomatic improvement.  She saw [name], NP 06/2022 and was doing better.  She is taking torsemide 20 mg with an additional 20 mg as needed.  [name] recommended that she start [place].  BNP has been consistently in the 500s despite titrating torsemide.  GFR has been between 41 and 52.  Echo 08/2022 revealed increasing tricuspid regurgitation, which was now severe.  LVEF was 55-60% with grade 2 diastolic dysfunction.  She had moderate mitral regurgitation.  She was also noted to have aortic atherosclerosis.  At her visit 10/27/22 she was feeling poorly and volume overloaded.  Bumex was increased to bid and she presents today for follow up.     ROS:  As per HPI  Studies Reviewed: .       Echo 08/2022:  1. Compared with the echo 04/2020, tricuspid regurgitation has increased  from mild/moderate to severe.   2. Left ventricular ejection fraction, by estimation, is 55 to 60%. The  left ventricle has normal function. The left ventricle has no regional  wall motion abnormalities. There is mild concentric left ventricular  hypertrophy. Left ventricular diastolic  parameters are consistent with Grade II diastolic dysfunction   (pseudonormalization).   3. Right ventricular systolic function is normal. The right ventricular  size is normal. There is moderately elevated pulmonary artery systolic  pressure.   4. Left atrial size was severely dilated.   5. Right atrial size was severely dilated.   6. Mitral valve regurgitant volume 45 mL. The mitral valve is normal in  structure. Moderate mitral valve regurgitation. No evidence of mitral  stenosis.   7. Tricuspid valve regurgitation is severe.   8. The aortic valve is tricuspid. Aortic valve regurgitation is moderate.  No aortic stenosis is present. Aortic regurgitation PHT measures 351 msec.   9. There is mild (Grade II) atheroma plaque involving the ascending  aorta.  10. The inferior vena cava is dilated in size with >50% respiratory  variability, suggesting right atrial pressure of 8 mmHg.   Risk Assessment/Calculations:    CHA2DS2-VASc Score =     This indicates a  % annual risk of stroke. The patient's score is based upon:             Physical Exam:    VS:  BP (!) 100/50 (BP Location: Left Arm, Patient Position: Sitting, Cuff Size: Normal)   Pulse 60   Ht 5' (1.524 m)   Wt 121 lb (54.9 kg)   BMI 23.63 kg/m  , BMI Body mass index is 23.63 kg/m. GENERAL:  Well appearing HEENT: Pupils equal round and reactive, fundi not visualized, oral mucosa unremarkable NECK:  JVP 2cm above clavicle at 45 degrees.  Waveform within normal limits, carotid upstroke brisk and symmetric, no bruits, no thyromegaly LUNGS:  Clear to auscultation bilaterally HEART:  RRR.  PMI not displaced or sustained,S1 and S2 within normal limits, no S3, no S4, no clicks, no rubs, III/VI holosystolic murmur at LLSB ABD:  Flat, positive bowel sounds normal in frequency in pitch, no bruits, no rebound, no guarding, no midline pulsatile mass, no hepatomegaly, no splenomegaly EXT:  2 plus pulses throughout, 1+ LE edema to mid tibia, no cyanosis no clubbing SKIN:  No rashes no  nodules NEURO:  Cranial nerves II through XII grossly intact, motor grossly intact throughout PSYCH:  Cognitively intact, oriented to person place and time   ASSESSMENT AND PLAN: .    # HFpEF: # Severe TR:  Symptoms improving with bid bumex.  OK to take an additional tablet as needed, especially after eating out.  BMP stable.  BNP was not drawn for unclear reasons.  Continue farxiga.  I don't think she is a surgical/procedural candidate. -Continue Bumex 2mg  twice daily. -If eating out or noticing weight gain, increase Bumex to 4mg  for the next dose. -Consider reducing Diltiazem dose if blood pressure continues to drop and patient experiences lightheadedness or dizziness. -Advanced HF appointment pending.   # Persistent Atrial Fibrillation Stable, with no reported symptoms of palpitations or irregular heartbeat. -Continue current regimen of Metoprolol and Diltiazem.  -Continue Flecainide and Xarelto  # Insomnia Chronic issue, with reported poor sleep last night. No changes to management discussed.   # Hyperlipidemia; Stable  on statin  Follow-up in 6 weeks, or sooner if blood pressure continues to drop.      Dispo: f/u 6 weeks  Signed, Chilton Si, MD

## 2022-11-01 NOTE — Patient Instructions (Signed)
Medication Instructions:  OK TO TAKE AN EXTRA BUMEX 2 MG AS NEEDED FOR WEIGHT GAIN, SWELLING, OR SHORTNESS OF BREATH   Labwork: NONE  Testing/Procedures: NONE  Follow-Up: 12/23/2022 10:OO AM WITH DR [facility]

## 2022-11-04 ENCOUNTER — Ambulatory Visit (HOSPITAL_BASED_OUTPATIENT_CLINIC_OR_DEPARTMENT_OTHER): Payer: Medicare Other

## 2022-11-04 ENCOUNTER — Encounter (HOSPITAL_BASED_OUTPATIENT_CLINIC_OR_DEPARTMENT_OTHER): Payer: Self-pay

## 2022-11-04 ENCOUNTER — Other Ambulatory Visit (HOSPITAL_BASED_OUTPATIENT_CLINIC_OR_DEPARTMENT_OTHER): Payer: Self-pay

## 2022-11-04 DIAGNOSIS — R2689 Other abnormalities of gait and mobility: Secondary | ICD-10-CM | POA: Diagnosis not present

## 2022-11-04 DIAGNOSIS — M6281 Muscle weakness (generalized): Secondary | ICD-10-CM

## 2022-11-04 MED ORDER — AREXVY 120 MCG/0.5ML IM SUSR
0.5000 mL | Freq: Once | INTRAMUSCULAR | 0 refills | Status: AC
  Filled 2022-11-04: qty 0.5, 1d supply, fill #0

## 2022-11-04 NOTE — Therapy (Signed)
OUTPATIENT PHYSICAL THERAPY LOWER EXTREMITY TREATMENT   Patient Name: Kathryn Gibson MRN: [id number] DOB:25-Jul-1933, 89 y.o., female Today's Date: 11/04/2022  END OF SESSION:  PT End of Session - 11/04/22 1341     Visit Number 12    Number of Visits 16    Date for PT Re-Evaluation 11/09/22    PT Start Time 1303    PT Stop Time 1345    PT Time Calculation (min) 42 min    Equipment Utilized During Treatment Gait belt    Activity Tolerance Patient tolerated treatment well    Behavior During Therapy [facility] for tasks assessed/performed                     Past Medical History:  Diagnosis Date   Arthritis    Atrial fibrillation (HCC)    Atypical chest pain 09/22/2020   Blood transfusion without reported diagnosis    2017   Bradycardia    s/p PPM   CAD in native artery 03/26/2021   Cataract    Chronic diastolic heart failure (HCC) 03/26/2021   Colonic polyp    adenomatous   Constipation    Diverticulosis    Hemorrhoids    Hyperlipidemia    Hypertension    Hypothyroidism    Irritable bowel syndrome (IBS)    Lower extremity edema 09/22/2020   Osteoporosis    osteopenia   Pacemaker 2010   UTI (urinary tract infection)    Past Surgical History:  Procedure Laterality Date   BREAST CYST ASPIRATION Left 1980s   COLONOSCOPY     ELECTROPHYSIOLOGIC STUDY N/A 07/01/2015   Procedure: Atrial Fibrillation Ablation;  Surgeon: Hillis Range, MD;  Location: [facility] INVASIVE CV LAB;  Service: Cardiovascular;  Laterality: N/A;   EYE SURGERY     heart ablation     for atrial fib   HEMATOMA EVACUATION Right 07/15/2014   Procedure: EVACUATION Right Groin Hematoma;  Surgeon: Larina Earthly, MD;  Location: [facility] OR;  Service: Vascular;  Laterality: Right;   NASAL ENDOSCOPY WITH EPISTAXIS CONTROL N/A 07/14/2014   Procedure: NASAL ENDOSCOPY WITH EPISTAXIS CONTROL;  Surgeon: Melvenia Beam, MD;  Location: [facility] OR;  Service: ENT;  Laterality: N/A;   NASAL ENDOSCOPY WITH EPISTAXIS CONTROL Bilateral  07/15/2014   Procedure: NASAL ENDOSCOPY WITH EPISTAXIS CONTROL;  Surgeon: Melvenia Beam, MD;  Location: [facility] OR;  Service: ENT;  Laterality: Bilateral;   PACEMAKER INSERTION  2010   implanted by Dr Deborah Chalk (MDT)   PPM GENERATOR CHANGEOUT N/A 08/16/2019   Procedure: PPM GENERATOR CHANGEOUT;  Surgeon: Hillis Range, MD;  Location: [facility] INVASIVE CV LAB;  Service: Cardiovascular;  Laterality: N/A;   RADIOLOGY WITH ANESTHESIA N/A 07/14/2014   Procedure: RADIOLOGY WITH ANESTHESIA;  Surgeon: Medication Radiologist, MD;  Location: [facility] OR;  Service: Radiology;  Laterality: N/A;   TEE WITHOUT CARDIOVERSION N/A 07/01/2015   Procedure: TRANSESOPHAGEAL ECHOCARDIOGRAM (TEE);  Surgeon: Quintella Reichert, MD;  Location: [facility] ENDOSCOPY;  Service: Cardiovascular;  Laterality: N/A;   Patient Active Problem List   Diagnosis Date Noted   Chronic diastolic heart failure (HCC) 03/26/2021   CAD in native artery 03/26/2021   Atypical chest pain 09/22/2020   Lower extremity edema 09/22/2020   Genital herpes simplex 09/18/2019   Fatigue 06/08/2018   [place] 12/14/2016   Rectal pain 11/12/2016   Ptosis of eyelid 04/05/2016   Hypokalemia    Acute blood loss anemia    Hypomagnesemia    Chronic atrial fibrillation (HCC)    Essential hypertension  Other specified hypothyroidism    Posterior epistaxis    Hematoma complicating a procedure    Diastolic dysfunction 07/14/2014   Epistaxis, recurrent 07/14/2014   Sick sinus syndrome (HCC) 04/29/2014   Encounter for therapeutic drug monitoring 04/04/2013   Sciatica of left side 11/22/2011   Aortic valve insufficiency 03/25/2011   Dyspnea 11/24/2010   Essential hypertension, benign 04/18/2010   Hypothyroidism 04/15/2010   PALPITATIONS 04/15/2010   BRADYCARDIA-TACHYCARDIA SYNDROME 12/11/2008   IRRITABLE BOWEL SYNDROME 12/11/2008   COLONIC POLYPS, ADENOMATOUS, HX OF 12/11/2008   CONSTIPATION 11/13/2008   ABDOMINAL BLOATING 11/13/2008   COAGULOPATHY 07/03/2008   Constipation  07/03/2008   CHANGE IN BOWELS 07/03/2008   UNSPECIFIED DISORDER OF THYROID 07/02/2008   DIVERTICULOSIS, COLON 07/02/2008    PCP: Dr Chilton Greathouse   REFERRING PROVIDER: [name] NP  REFERRING DIAG:   Diagnosis  R53.81 (ICD-10-CM) - Physical deconditioning    THERAPY DIAG:  Other abnormalities of gait and mobility  Muscle weakness (generalized)  Rationale for Evaluation and Treatment: Rehabilitation  ONSET DATE: Around the pandemic she began having shortness of breath   SUBJECTIVE:   SUBJECTIVE STATEMENT: Pt reports she continues to lose fluid. Lost 2lbs since yesterday. SOB is improving overall, though some days are worse than others. Feels less SOB when laying down at night.   PERTINENT HISTORY: A-fib, Chest pain 2022, lower extremity swelling, Pacemaker; Osteopenia,  PAIN:  Are you having pain?  General arthritis that comes and goes.   PRECAUTIONS: Pacemaker   RED FLAGS: None   WEIGHT BEARING RESTRICTIONS: No  FALLS:  Has patient fallen in last 6 months? No  LIVING ENVIRONMENT: Has steps she can walk but she doesn't do them much OCCUPATION:  Retired  Risk manager:  The pool, Walking, reading, going ot to dinner.   PLOF: Independent Uses a walker at this time for longer distances   PATIENT GOALS:  To get the strength in her legs back   NEXT MD VISIT: Nothing scheduled.   OBJECTIVE:   DIAGNOSTIC FINDINGS:  Nothing   PATIENT SURVEYS:  FOTO 55% at eval; 53% on 9/26  COGNITION: Overall cognitive status: Within functional limits for tasks assessed     SENSATION: WFL  EDEMA:  Bilateral LE edema controlled with ted hose and diuretics  MUSCLE LENGTH: POSTURE: No Significant postural limitations  PALPATION: No tenderness to plapation   LOWER EXTREMITY ROM:  Active ROM Right eval Left eval  Hip flexion    Hip extension    Hip abduction    Hip adduction    Hip internal rotation    Hip external rotation    Knee flexion    Knee  extension    Ankle dorsiflexion    Ankle plantarflexion    Ankle inversion    Ankle eversion     (Blank rows = not tested) within normal limits  LOWER EXTREMITY MMT:  MMT Right eval Left eval  Hip flexion 15.5 14.8  Hip extension    Hip abduction 25.3 26.8  Hip adduction    Hip internal rotation    Hip external rotation    Knee flexion    Knee extension 23.9 18.5  Ankle dorsiflexion    Ankle plantarflexion    Ankle inversion    Ankle eversion     (Blank rows = not tested) WNL  LOWER EXTREMITY SPECIAL TESTS:   FUNCTIONAL TESTS:  6 minute walk test:   5x sit to stand 13 sec; 9/23: 5xSTS: 8 seconds Base line HR 75 bpm  GAIT: Uses walker but does not put much weight on the walker   TODAY'S TREATMENT:                                                                                                                              DATE:    9/26:  97%, 55bpm Nu step 5 min L3 Seated LAQ 2 x 15 each leg 3# Sit to stands 3x10 Gait without walker x242ft Romberg stance on airex with head turns/nods- 2x30sec ea SBA  Marching on airex 2x10 without UE support Obstacle course along rail: Step up/over airex pad, x4 hurdles fwd/reciporcal, airex balance beam tandem walk- x2 laps. Cues to decrease UE support with 2nd set. CGA Step ups 6" x10ea  Stair climbing (1/2 flight reciprocal) Lateral hurdle step overs 4 hurdles x2 laps, light CGA  9/23:  BP 107/62 (normal per pt) Nu step 5 min L3 Seated LAQ 2 x 15 each leg 3# Sit to stands 3x10 5xSTS test Gait with walker x236ft Romberg stance on airex with head turns/nods- 2x30sec ea Light CGA  Step ups onto airex 2x10 Step ups 6" 2x5ea Hurdle step overs fwd- 4 hurdles x3 laps with and without hand rail. CGA)  9/19:  Seated march 2x30 GTB Seated hip abduction 2x30GTB Seated LAQ 2 x 15 each leg 3#   Bilateral bicep curl 2 pounds 2 x 20 Bilateral punch 2 pounds 2 x 20 TB row seated- 2x20 GTB  Sit to stands 3x10  9/12 5  min L4  Hurdles: 4 hurdles step over no UE support with CGA/SBA x4 laps 4 hurdles lateral 4 laps   Romberg stance on airex with EC- 2x30sec  Step ups onto airex 2x10 Sit to stands x20  Gait without walker x285ft  Rhomberg with reaching across body- standing on airex x10ea  9/10 5 min L3   Hurdles: 5 hurdles step over no UE support with CGA x4 laps 5 hurdles backwards/ forwards 4 laps   Gait:  Long strides 100'  March 100'   Walk with head turns 50' Walk with head nod 50'  CGA -> min a with all   UE:   Bicpes curls 2 lbs 12x10 Punch 2 lbs 2x15  Press 2x15 2lbs   PATIENT EDUCATION:  Education details: HEP, symptom management, progression of activity  Person educated: Patient Education method: Explanation, Demonstration, Tactile cues, Verbal cues, and Handouts Education comprehension: verbalized understanding, returned demonstration, verbal cues required, tactile cues required, and needs further education  HOME EXERCISE PROGRAM: Access Code: [email or web address] URL: [email or web address] Date: 09/14/2022 Prepared by: Lorayne Bender  Exercises - Seated Knee Extension with Resistance  - 1 x daily - 7 x weekly - 3 sets - 15 reps - Seated Hip Abduction with Resistance  - 1 x daily - 7 x weekly - 3 sets - 15 reps - Seated Knee Lifts with Resistance  - 1 x daily - 7 x weekly - 3 sets - 10 reps  ASSESSMENT:  CLINICAL IMPRESSION: Pt was able to progress with balance and strength challenges today with improved endurance compared to previous sessions. She was able to ambulate 214ft in clinic without  AD without rest break, though was fatigued by end of this and needed to sit. Trialed stair climbing (1/2 flight) which she climbed reciprocally without unsteadiness. Obstacle course utilized for balance training today with overall good tolerance, though she does require cues for decreased pace to allow time for correction of unsteadiness. Pt has re-eval next visit. Will  monitor SOB and O2% for any changes.   OBJECTIVE IMPAIRMENTS: cardiopulmonary status limiting activity, difficulty walking, decreased strength, and impaired perceived functional ability.   ACTIVITY LIMITATIONS: standing, stairs, and locomotion level  PARTICIPATION LIMITATIONS: meal prep, cleaning, laundry, shopping, and community activity  PERSONAL FACTORS: Age and 1 comorbidity: multiple cardiovascular issues  are also affecting patient's functional outcome.   REHAB POTENTIAL: Good  CLINICAL DECISION MAKING: Evolving/moderate complexity  EVALUATION COMPLEXITY: Moderate   GOALS: Goals reviewed with patient? Yes  SHORT TERM GOALS: Target date: 10/12/2022       Patient will increase gross bilateral lower extremity strength by 5 pounds Baseline: Goal status: INITIAL  2.  Patient will increase 6-minute walk test distance by 300 feet and reduce rest breaks by 1 Baseline:  Goal status: INITIAL  3.  Patient will decrease time on 5 times sit to stand test by 3 seconds Baseline:  Goal status: MET 9/23  4.  Patient will be independent with base exercise program Baseline:  Goal status: MET 9/23   LONG TERM GOALS: Target date:  11/09/2022    Patient will walk to her meal hall without increased dyspnea Baseline:  Goal status: Ongoing SOB with short distances without walker 9/23  2.  Patient will go up and down steps without increase shortness of breath in order to improve community ambulation Baseline:  Goal status: Pt has not tried stairs since she has been using her walker -9/23  3.  Patient will be able to go to the pool and back without significant fatigue Baseline:  Goal status: IN PROGRESS (remains significantly fatigued with this per pt) 9/23  4.    PLAN:  PT FREQUENCY: 1-2x/week  PT DURATION: 8 weeks  PLANNED INTERVENTIONS: Therapeutic exercises, Therapeutic activity, Neuromuscular re-education, Balance training, Gait training, Patient/Family education,  Self Care, Stair training, DME instructions, Aquatic Therapy; Electrical stimulation, Cryotherapy, Moist heat, Taping, Manual therapy, and Re-evaluation.   PLAN FOR NEXT SESSION:  Consider light stair training; consider intervals on nu-step or bike; monitor HR. Patient can ambualte about 1:30 min wouout a rest break. Work on increasing her time.  Consider gross exercise program.  Patient already has a strong home program to work on.   Donnel Saxon Kalem Rockwell, PTA 11/04/2022, 2:00 PM

## 2022-11-05 ENCOUNTER — Other Ambulatory Visit: Payer: Self-pay

## 2022-11-05 ENCOUNTER — Other Ambulatory Visit (HOSPITAL_BASED_OUTPATIENT_CLINIC_OR_DEPARTMENT_OTHER): Payer: Self-pay | Admitting: Cardiovascular Disease

## 2022-11-05 ENCOUNTER — Other Ambulatory Visit (HOSPITAL_BASED_OUTPATIENT_CLINIC_OR_DEPARTMENT_OTHER): Payer: Self-pay | Admitting: Family

## 2022-11-05 ENCOUNTER — Other Ambulatory Visit (HOSPITAL_BASED_OUTPATIENT_CLINIC_OR_DEPARTMENT_OTHER): Payer: Self-pay

## 2022-11-05 MED ORDER — BUMETANIDE 2 MG PO TABS
2.0000 mg | ORAL_TABLET | Freq: Two times a day (BID) | ORAL | 3 refills | Status: DC
  Filled 2022-11-05: qty 180, 90d supply, fill #0

## 2022-11-08 ENCOUNTER — Other Ambulatory Visit (HOSPITAL_BASED_OUTPATIENT_CLINIC_OR_DEPARTMENT_OTHER): Payer: Self-pay

## 2022-11-11 ENCOUNTER — Other Ambulatory Visit (HOSPITAL_BASED_OUTPATIENT_CLINIC_OR_DEPARTMENT_OTHER): Payer: Self-pay

## 2022-11-11 ENCOUNTER — Other Ambulatory Visit (HOSPITAL_COMMUNITY): Payer: Self-pay

## 2022-11-12 ENCOUNTER — Ambulatory Visit (INDEPENDENT_AMBULATORY_CARE_PROVIDER_SITE_OTHER): Payer: Medicare Other

## 2022-11-12 ENCOUNTER — Ambulatory Visit (HOSPITAL_BASED_OUTPATIENT_CLINIC_OR_DEPARTMENT_OTHER): Payer: Medicare Other | Attending: Internal Medicine | Admitting: Physical Therapy

## 2022-11-12 ENCOUNTER — Encounter (HOSPITAL_BASED_OUTPATIENT_CLINIC_OR_DEPARTMENT_OTHER): Payer: Self-pay | Admitting: Physical Therapy

## 2022-11-12 DIAGNOSIS — I495 Sick sinus syndrome: Secondary | ICD-10-CM | POA: Diagnosis not present

## 2022-11-12 DIAGNOSIS — R2689 Other abnormalities of gait and mobility: Secondary | ICD-10-CM | POA: Insufficient documentation

## 2022-11-12 DIAGNOSIS — M6281 Muscle weakness (generalized): Secondary | ICD-10-CM | POA: Diagnosis not present

## 2022-11-12 LAB — CUP PACEART REMOTE DEVICE CHECK
Battery Remaining Longevity: 87 mo
Battery Voltage: 2.99 V
Brady Statistic AP VP Percent: 98.63 %
Brady Statistic AP VS Percent: 0.11 %
Brady Statistic AS VP Percent: 0.49 %
Brady Statistic AS VS Percent: 0.78 %
Brady Statistic RA Percent Paced: 98.83 %
Brady Statistic RV Percent Paced: 99.11 %
Date Time Interrogation Session: 20241004033416
Implantable Lead Connection Status: 753985
Implantable Lead Connection Status: 753985
Implantable Lead Implant Date: 20101109
Implantable Lead Implant Date: 20101109
Implantable Lead Location: 753859
Implantable Lead Location: 753860
Implantable Lead Model: 4469
Implantable Lead Model: 4470
Implantable Lead Serial Number: 535369
Implantable Lead Serial Number: 657967
Implantable Pulse Generator Implant Date: 20210708
Lead Channel Impedance Value: 247 Ohm
Lead Channel Impedance Value: 342 Ohm
Lead Channel Impedance Value: 380 Ohm
Lead Channel Impedance Value: 380 Ohm
Lead Channel Pacing Threshold Amplitude: 0.5 V
Lead Channel Pacing Threshold Amplitude: 1 V
Lead Channel Pacing Threshold Pulse Width: 0.4 ms
Lead Channel Pacing Threshold Pulse Width: 0.4 ms
Lead Channel Sensing Intrinsic Amplitude: 0.125 mV
Lead Channel Sensing Intrinsic Amplitude: 0.125 mV
Lead Channel Sensing Intrinsic Amplitude: 3.5 mV
Lead Channel Sensing Intrinsic Amplitude: 3.5 mV
Lead Channel Setting Pacing Amplitude: 2.5 V
Lead Channel Setting Pacing Amplitude: 2.5 V
Lead Channel Setting Pacing Pulse Width: 0.4 ms
Lead Channel Setting Sensing Sensitivity: 1.2 mV
Zone Setting Status: 755011
Zone Setting Status: 755011

## 2022-11-12 NOTE — Therapy (Unsigned)
OUTPATIENT PHYSICAL THERAPY LOWER EXTREMITY TREATMENT   Patient Name: Kathryn Gibson MRN: [id number] DOB:04-Jun-1933, 89 y.o., female Today's Date: 11/14/2022  END OF SESSION:  PT End of Session - 11/14/22 0759     Visit Number 13    Number of Visits 25    Date for PT Re-Evaluation 12/26/22    PT Start Time 1015    PT Stop Time 1058    PT Time Calculation (min) 43 min    Activity Tolerance Patient tolerated treatment well    Behavior During Therapy [facility] for tasks assessed/performed                Progress Note Reporting Period 08/06 to 10/4  See note below for Objective Data and Assessment of Progress/Goals.          Past Medical History:  Diagnosis Date   Arthritis    Atrial fibrillation (HCC)    Atypical chest pain 09/22/2020   Blood transfusion without reported diagnosis    2017   Bradycardia    s/p PPM   CAD in native artery 03/26/2021   Cataract    Chronic diastolic heart failure (HCC) 03/26/2021   Colonic polyp    adenomatous   Constipation    Diverticulosis    Hemorrhoids    Hyperlipidemia    Hypertension    Hypothyroidism    Irritable bowel syndrome (IBS)    Lower extremity edema 09/22/2020   Osteoporosis    osteopenia   Pacemaker 2010   UTI (urinary tract infection)    Past Surgical History:  Procedure Laterality Date   BREAST CYST ASPIRATION Left 1980s   COLONOSCOPY     ELECTROPHYSIOLOGIC STUDY N/A 07/01/2015   Procedure: Atrial Fibrillation Ablation;  Surgeon: Hillis Range, MD;  Location: [facility] INVASIVE CV LAB;  Service: Cardiovascular;  Laterality: N/A;   EYE SURGERY     heart ablation     for atrial fib   HEMATOMA EVACUATION Right 07/15/2014   Procedure: EVACUATION Right Groin Hematoma;  Surgeon: Larina Earthly, MD;  Location: [facility] OR;  Service: Vascular;  Laterality: Right;   NASAL ENDOSCOPY WITH EPISTAXIS CONTROL N/A 07/14/2014   Procedure: NASAL ENDOSCOPY WITH EPISTAXIS CONTROL;  Surgeon: Melvenia Beam, MD;  Location[facility] OR;  Service: ENT;   Laterality: N/A;   NASAL ENDOSCOPY WITH EPISTAXIS CONTROL Bilateral 07/15/2014   Procedure: NASAL ENDOSCOPY WITH EPISTAXIS CONTROL;  Surgeon: Melvenia Beam, MD;  Location: [facility] OR;  Service: ENT;  Laterality: Bilateral;   PACEMAKER INSERTION  2010   implanted by Dr Deborah Chalk (MDT)   PPM GENERATOR CHANGEOUT N/A 08/16/2019   Procedure: PPM GENERATOR CHANGEOUT;  Surgeon: Hillis Range, MD;  Location: [facility] INVASIVE CV LAB;  Service: Cardiovascular;  Laterality: N/A;   RADIOLOGY WITH ANESTHESIA N/A 07/14/2014   Procedure: RADIOLOGY WITH ANESTHESIA;  Surgeon: Medication Radiologist, MD;  Location: [facility] OR;  Service: Radiology;  Laterality: N/A;   TEE WITHOUT CARDIOVERSION N/A 07/01/2015   Procedure: TRANSESOPHAGEAL ECHOCARDIOGRAM (TEE);  Surgeon: Quintella Reichert, MD;  Location: [facility] ENDOSCOPY;  Service: Cardiovascular;  Laterality: N/A;   Patient Active Problem List   Diagnosis Date Noted   Chronic diastolic heart failure (HCC) 03/26/2021   CAD in native artery 03/26/2021   Atypical chest pain 09/22/2020   Lower extremity edema 09/22/2020   Genital herpes simplex 09/18/2019   Fatigue 06/08/2018   [place] 12/14/2016   Rectal pain 11/12/2016   Ptosis of eyelid 04/05/2016   Hypokalemia    Acute blood loss anemia  Hypomagnesemia    Chronic atrial fibrillation (HCC)    Essential hypertension    Other specified hypothyroidism    Posterior epistaxis    Hematoma complicating a procedure    Diastolic dysfunction 07/14/2014   Epistaxis, recurrent 07/14/2014   Sick sinus syndrome (HCC) 04/29/2014   Encounter for therapeutic drug monitoring 04/04/2013   Sciatica of left side 11/22/2011   Aortic valve insufficiency 03/25/2011   Dyspnea 11/24/2010   Essential hypertension, benign 04/18/2010   Hypothyroidism 04/15/2010   PALPITATIONS 04/15/2010   BRADYCARDIA-TACHYCARDIA SYNDROME 12/11/2008   IRRITABLE BOWEL SYNDROME 12/11/2008   History of colonic polyps 12/11/2008   Constipation 11/13/2008   ABDOMINAL  BLOATING 11/13/2008   COAGULOPATHY 07/03/2008   Constipation 07/03/2008   CHANGE IN BOWELS 07/03/2008   Disorder of thyroid 07/02/2008   Diverticulosis of colon 07/02/2008    PCP: Dr Chilton Greathouse   REFERRING PROVIDER: [name] NP  REFERRING DIAG:   Diagnosis  R53.81 (ICD-10-CM) - Physical deconditioning    THERAPY DIAG:  Other abnormalities of gait and mobility  Muscle weakness (generalized)  Rationale for Evaluation and Treatment: Rehabilitation  ONSET DATE: Around the pandemic she began having shortness of breath   SUBJECTIVE:   SUBJECTIVE STATEMENT: The patient reports her fluid levels continue to improve. She has been getting short of breath at night. She feels like she is able to push more with therapy now that her medication levels are starting to work better.  PERTINENT HISTORY: A-fib, Chest pain 2022, lower extremity swelling, Pacemaker; Osteopenia,  PAIN:  Are you having pain?  General arthritis that comes and goes.   PRECAUTIONS: Pacemaker   RED FLAGS: None   WEIGHT BEARING RESTRICTIONS: No  FALLS:  Has patient fallen in last 6 months? No  LIVING ENVIRONMENT: Has steps she can walk but she doesn't do them much OCCUPATION:  Retired  Risk manager:  The pool, Walking, reading, going ot to dinner.   PLOF: Independent Uses a walker at this time for longer distances   PATIENT GOALS:  To get the strength in her legs back   NEXT MD VISIT: Nothing scheduled.   OBJECTIVE:   DIAGNOSTIC FINDINGS:  Nothing   PATIENT SURVEYS:  FOTO 55% at eval; 53% on 9/26  COGNITION: Overall cognitive status: Within functional limits for tasks assessed     SENSATION: WFL  EDEMA:  Bilateral LE edema controlled with ted hose and diuretics  MUSCLE LENGTH: POSTURE: No Significant postural limitations  PALPATION: No tenderness to plapation   LOWER EXTREMITY ROM:  Active ROM Right eval Left eval  Hip flexion    Hip extension    Hip abduction    Hip  adduction    Hip internal rotation    Hip external rotation    Knee flexion    Knee extension    Ankle dorsiflexion    Ankle plantarflexion    Ankle inversion    Ankle eversion     (Blank rows = not tested) within normal limits  LOWER EXTREMITY MMT:  MMT Right eval Left eval Right  10/4 Left 10/4  Hip flexion 15.5 14.8 19.3 17.3  Hip extension      Hip abduction 25.3 26.8 27.4 31.4  Hip adduction      Hip internal rotation      Hip external rotation      Knee flexion      Knee extension 23.9 18.5 30.1 23.7  Ankle dorsiflexion      Ankle plantarflexion      Ankle  inversion      Ankle eversion       (Blank rows = not tested) WNL  LOWER EXTREMITY SPECIAL TESTS:   FUNCTIONAL TESTS:  6 minute walk test: 643   1 min seated rest break at 425 ' HR not able to be obtained 2nd to cold fingers 5x sit to stand 13 sec; 9/23: 5xSTS: 8 seconds Base line HR 75 bpm  GAIT: Uses walker but does not put much weight on the walker    TODAY'S TREATMENT:                                                                                                                              DATE:   10/15  Performed 6 min walk test and strengthe measures FOTO perfomed last visit   Hurdle fwd and back step/ side to side 5 hurdles 3 laps each   Hurdle step with pace 2x20   Hallway walking head turns and nods 4x 100'   Changing speeds 4x 100'      9/26:  97%, 55bpm Nu step 5 min L3 Seated LAQ 2 x 15 each leg 3# Sit to stands 3x10 Gait without walker x243ft Romberg stance on airex with head turns/nods- 2x30sec ea SBA  Marching on airex 2x10 without UE support Obstacle course along rail: Step up/over airex pad, x4 hurdles fwd/reciporcal, airex balance beam tandem walk- x2 laps. Cues to decrease UE support with 2nd set. CGA Step ups 6" x10ea  Stair climbing (1/2 flight reciprocal) Lateral hurdle step overs 4 hurdles x2 laps, light CGA  9/23:  BP 107/62 (normal per pt) Nu step 5 min  L3 Seated LAQ 2 x 15 each leg 3# Sit to stands 3x10 5xSTS test Gait with walker x256ft Romberg stance on airex with head turns/nods- 2x30sec ea Light CGA  Step ups onto airex 2x10 Step ups 6" 2x5ea Hurdle step overs fwd- 4 hurdles x3 laps with and without hand rail. CGA)  9/19:  Seated march 2x30 GTB Seated hip abduction 2x30GTB Seated LAQ 2 x 15 each leg 3#   Bilateral bicep curl 2 pounds 2 x 20 Bilateral punch 2 pounds 2 x 20 TB row seated- 2x20 GTB  Sit to stands 3x10  9/12 5 min L4  Hurdles: 4 hurdles step over no UE support with CGA/SBA x4 laps 4 hurdles lateral 4 laps   Romberg stance on airex with EC- 2x30sec  Step ups onto airex 2x10 Sit to stands x20  Gait without walker x283ft  Rhomberg with reaching across body- standing on airex x10ea  9/10 5 min L3   Hurdles: 5 hurdles step over no UE support with CGA x4 laps 5 hurdles backwards/ forwards 4 laps   Gait:  Long strides 100'  March 100'   Walk with head turns 50' Walk with head nod 50'  CGA -> min a with all   UE:   Bicpes curls 2 lbs 12x10 Punch 2 lbs  2x15  Press 2x15 2lbs   PATIENT EDUCATION:  Education details: HEP, symptom management, progression of activity  Person educated: Patient Education method: Explanation, Demonstration, Tactile cues, Verbal cues, and Handouts Education comprehension: verbalized understanding, returned demonstration, verbal cues required, tactile cues required, and needs further education  HOME EXERCISE PROGRAM: Access Code: [email or web address] URL: [email or web address] Date: 09/14/2022 Prepared by: Lorayne Bender  Exercises - Seated Knee Extension with Resistance  - 1 x daily - 7 x weekly - 3 sets - 15 reps - Seated Hip Abduction with Resistance  - 1 x daily - 7 x weekly - 3 sets - 15 reps - Seated Knee Lifts with Resistance  - 1 x daily - 7 x weekly - 3 sets - 10 reps  ASSESSMENT:  CLINICAL IMPRESSION: The patients strength and endurance have  improved. We were able to complete a 6 minute walk test. We were unable at eval 2nd to dyspnea. Her FOTO score is about the same. In the iddle of our initial POC she hd a flair of CHF which limited our ability to push the patient. She has  better over the last 2 weeks. Depsite this period of time she has still made progress. We will continue 2x a week for 6 weeks. She was able to complete a 6 min walk test this visit. She required a 2 min seated rest break   OBJECTIVE IMPAIRMENTS: cardiopulmonary status limiting activity, difficulty walking, decreased strength, and impaired perceived functional ability.   ACTIVITY LIMITATIONS: standing, stairs, and locomotion level  PARTICIPATION LIMITATIONS: meal prep, cleaning, laundry, shopping, and community activity  PERSONAL FACTORS: Age and 1 comorbidity: multiple cardiovascular issues  are also affecting patient's functional outcome.   REHAB POTENTIAL: Good  CLINICAL DECISION MAKING: Evolving/moderate complexity  EVALUATION COMPLEXITY: Moderate   GOALS: Goals reviewed with patient? Yes  SHORT TERM GOALS: Target date:12/12/2022         Patient will increase gross bilateral lower extremity strength by 5 pounds Baseline: Goal status:  progressing 10/6  2.  Patient will increase 6-minute walk test distance by 300 feet and reduce rest breaks by 1 Baseline:  Goal status:    3.  Patient will decrease time on 5 times sit to stand test by 3 seconds Baseline:  Goal status: MET  10/6  4.  Patient will be independent with base exercise program Baseline:  Goal status: Has base exercise profram 10/6    LONG TERM GOALS: Target date:  12/26/2022      Patient will walk to her meal hall without increased dyspnea Baseline:  Goal status: Ongoing SOB with short distances without walker improving as of lately 10/6   2.  Patient will go up and down steps without increase shortness of breath in order to improve community ambulation Baseline:   Goal status: Patient begging to practice stairs   3.  Patient will be able to go to the pool and back without significant fatigue Baseline:  Goal status: IN PROGRESS (remains significantly fatigued with this per pt) 9/23  4.    PLAN:  PT FREQUENCY: 1-2x/week  PT DURATION: 8 weeks  PLANNED INTERVENTIONS: Therapeutic exercises, Therapeutic activity, Neuromuscular re-education, Balance training, Gait training, Patient/Family education, Self Care, Stair training, DME instructions, Aquatic Therapy; Electrical stimulation, Cryotherapy, Moist heat, Taping, Manual therapy, and Re-evaluation.   PLAN FOR NEXT SESSION:  Consider light stair training; consider intervals on nu-step or bike; monitor HR. Patient can ambualte about 1:30 min wouout a rest break. Work on increasing her time.  Consider gross exercise program.  Patient already has a strong home program to work on.   Dessie Coma, PT 11/14/2022, 8:00 AM

## 2022-11-17 ENCOUNTER — Other Ambulatory Visit (HOSPITAL_BASED_OUTPATIENT_CLINIC_OR_DEPARTMENT_OTHER): Payer: Self-pay

## 2022-11-17 DIAGNOSIS — I5032 Chronic diastolic (congestive) heart failure: Secondary | ICD-10-CM | POA: Diagnosis not present

## 2022-11-17 DIAGNOSIS — Z23 Encounter for immunization: Secondary | ICD-10-CM | POA: Diagnosis not present

## 2022-11-17 DIAGNOSIS — I110 Hypertensive heart disease with heart failure: Secondary | ICD-10-CM | POA: Diagnosis not present

## 2022-11-18 ENCOUNTER — Other Ambulatory Visit (HOSPITAL_BASED_OUTPATIENT_CLINIC_OR_DEPARTMENT_OTHER): Payer: Self-pay

## 2022-11-19 ENCOUNTER — Encounter (HOSPITAL_BASED_OUTPATIENT_CLINIC_OR_DEPARTMENT_OTHER): Payer: Self-pay | Admitting: Physical Therapy

## 2022-11-19 ENCOUNTER — Ambulatory Visit (HOSPITAL_BASED_OUTPATIENT_CLINIC_OR_DEPARTMENT_OTHER): Payer: Medicare Other | Admitting: Physical Therapy

## 2022-11-19 DIAGNOSIS — R2689 Other abnormalities of gait and mobility: Secondary | ICD-10-CM

## 2022-11-19 DIAGNOSIS — M6281 Muscle weakness (generalized): Secondary | ICD-10-CM | POA: Diagnosis not present

## 2022-11-19 NOTE — Progress Notes (Signed)
Remote pacemaker transmission.   

## 2022-11-19 NOTE — Therapy (Signed)
OUTPATIENT PHYSICAL THERAPY LOWER EXTREMITY TREATMENT   Patient Name: Kathryn Gibson MRN: [id number] DOB:[birth year], 89 y.o., female Today's Date: 11/19/2022  END OF SESSION:  PT End of Session - 11/19/22 1239     Visit Number 14    Number of Visits 25    Date for PT Re-Evaluation 12/26/22    PT Start Time 1230    PT Stop Time 1314    PT Time Calculation (min) 44 min    Activity Tolerance Patient tolerated treatment well    Behavior During Therapy [facility] for tasks assessed/performed                Progress Note Reporting Period 08/06 to 10/4  See note below for Objective Data and Assessment of Progress/Goals.          Past Medical History:  Diagnosis Date   Arthritis    Atrial fibrillation (HCC)    Atypical chest pain 09/22/2020   Blood transfusion without reported diagnosis    2017   Bradycardia    s/p PPM   CAD in native artery 03/26/2021   Cataract    Chronic diastolic heart failure (HCC) 03/26/2021   Colonic polyp    adenomatous   Constipation    Diverticulosis    Hemorrhoids    Hyperlipidemia    Hypertension    Hypothyroidism    Irritable bowel syndrome (IBS)    Lower extremity edema 09/22/2020   Osteoporosis    osteopenia   Pacemaker 2010   UTI (urinary tract infection)    Past Surgical History:  Procedure Laterality Date   BREAST CYST ASPIRATION Left 1980s   COLONOSCOPY     ELECTROPHYSIOLOGIC STUDY N/A 07/01/2015   Procedure: Atrial Fibrillation Ablation;  Surgeon: Hillis Range, MD;  Location: [facility] INVASIVE CV LAB;  Service: Cardiovascular;  Laterality: N/A;   EYE SURGERY     heart ablation     for atrial fib   HEMATOMA EVACUATION Right 07/15/2014   Procedure: EVACUATION Right Groin Hematoma;  Surgeon: Larina Earthly, MD;  Location: [facility] OR;  Service: Vascular;  Laterality: Right;   NASAL ENDOSCOPY WITH EPISTAXIS CONTROL N/A 07/14/2014   Procedure: NASAL ENDOSCOPY WITH EPISTAXIS CONTROL;  Surgeon: Melvenia Beam, MD;  Location: [facility] OR;  Service: ENT;   Laterality: N/A;   NASAL ENDOSCOPY WITH EPISTAXIS CONTROL Bilateral 07/15/2014   Procedure: NASAL ENDOSCOPY WITH EPISTAXIS CONTROL;  Surgeon: Melvenia Beam, MD;  Location: [facility] OR;  Service: ENT;  Laterality: Bilateral;   PACEMAKER INSERTION  2010   implanted by Dr Deborah Chalk ([facility])   PPM GENERATOR CHANGEOUT N/A 08/16/2019   Procedure: PPM GENERATOR CHANGEOUT;  Surgeon: Hillis Range, MD;  Location: [facility] INVASIVE CV LAB;  Service: Cardiovascular;  Laterality: N/A;   RADIOLOGY WITH ANESTHESIA N/A 07/14/2014   Procedure: RADIOLOGY WITH ANESTHESIA;  Surgeon: Medication Radiologist, MD;  Location: [facility] OR;  Service: Radiology;  Laterality: N/A;   TEE WITHOUT CARDIOVERSION N/A 07/01/2015   Procedure: TRANSESOPHAGEAL ECHOCARDIOGRAM (TEE);  Surgeon: Quintella Reichert, MD;  Location: [facility] ENDOSCOPY;  Service: Cardiovascular;  Laterality: N/A;   Patient Active Problem List   Diagnosis Date Noted   Chronic diastolic heart failure (HCC) 03/26/2021   CAD in native artery 03/26/2021   Atypical chest pain 09/22/2020   Lower extremity edema 09/22/2020   Genital herpes simplex 09/18/2019   Fatigue 06/08/2018   [place] 12/14/2016   Rectal pain 11/12/2016   Ptosis of eyelid 04/05/2016   Hypokalemia    Acute blood loss anemia  Hypomagnesemia    Chronic atrial fibrillation (HCC)    Essential hypertension    Other specified hypothyroidism    Posterior epistaxis    Hematoma complicating a procedure    Diastolic dysfunction 07/14/2014   Epistaxis, recurrent 07/14/2014   Sick sinus syndrome (HCC) 04/29/2014   Encounter for therapeutic drug monitoring 04/04/2013   Sciatica of left side 11/22/2011   Aortic valve insufficiency 03/25/2011   Dyspnea 11/24/2010   Essential hypertension, benign 04/18/2010   Hypothyroidism 04/15/2010   PALPITATIONS 04/15/2010   BRADYCARDIA-TACHYCARDIA SYNDROME 12/11/2008   IRRITABLE BOWEL SYNDROME 12/11/2008   History of colonic polyps 12/11/2008   Constipation 11/13/2008   ABDOMINAL  BLOATING 11/13/2008   COAGULOPATHY 07/03/2008   Constipation 07/03/2008   CHANGE IN BOWELS 07/03/2008   Disorder of thyroid 07/02/2008   Diverticulosis of colon 07/02/2008    PCP: Dr Chilton Greathouse   REFERRING PROVIDER: [name] NP  REFERRING DIAG:   Diagnosis  R53.81 (ICD-10-CM) - Physical deconditioning    THERAPY DIAG:  Other abnormalities of gait and mobility  Muscle weakness (generalized)  Rationale for Evaluation and Treatment: Rehabilitation  ONSET DATE: Around the pandemic she began having shortness of breath   SUBJECTIVE:   SUBJECTIVE STATEMENT: The patient reports her fluid levels continue to improve. She has been getting short of breath at night. She feels like she is able to push more with therapy now that her medication levels are starting to work better.  PERTINENT HISTORY: A-fib, Chest pain 2022, lower extremity swelling, Pacemaker; Osteopenia,  PAIN:  Are you having pain?  General arthritis that comes and goes.   PRECAUTIONS: Pacemaker   RED FLAGS: None   WEIGHT BEARING RESTRICTIONS: No  FALLS:  Has patient fallen in last 6 months? No  LIVING ENVIRONMENT: Has steps she can walk but she doesn't do them much OCCUPATION:  Retired  Risk manager:  The pool, Walking, reading, going ot to dinner.   PLOF: Independent Uses a walker at this time for longer distances   PATIENT GOALS:  To get the strength in her legs back   NEXT MD VISIT: Nothing scheduled.   OBJECTIVE:   DIAGNOSTIC FINDINGS:  Nothing   PATIENT SURVEYS:  FOTO 55% at eval; 53% on 9/26  COGNITION: Overall cognitive status: Within functional limits for tasks assessed     SENSATION: WFL  EDEMA:  Bilateral LE edema controlled with ted hose and diuretics  MUSCLE LENGTH: POSTURE: No Significant postural limitations  PALPATION: No tenderness to plapation   LOWER EXTREMITY ROM:  Active ROM Right eval Left eval  Hip flexion    Hip extension    Hip abduction    Hip  adduction    Hip internal rotation    Hip external rotation    Knee flexion    Knee extension    Ankle dorsiflexion    Ankle plantarflexion    Ankle inversion    Ankle eversion     (Blank rows = not tested) within normal limits  LOWER EXTREMITY MMT:  MMT Right eval Left eval Right  10/4 Left 10/4  Hip flexion 15.5 14.8 19.3 17.3  Hip extension      Hip abduction 25.3 26.8 27.4 31.4  Hip adduction      Hip internal rotation      Hip external rotation      Knee flexion      Knee extension 23.9 18.5 30.1 23.7  Ankle dorsiflexion      Ankle plantarflexion      Ankle  inversion      Ankle eversion       (Blank rows = not tested) WNL  LOWER EXTREMITY SPECIAL TESTS:   FUNCTIONAL TESTS:  6 minute walk test: 643   1 min seated rest break at 425 ' HR not able to be obtained 2nd to cold fingers 5x sit to stand 13 sec; 9/23: 5xSTS: 8 seconds Base line HR 75 bpm  GAIT: Uses walker but does not put much weight on the walker    TODAY'S TREATMENT:                                                                                                                              DATE:   10/12  Hurdle fwd and back step/ side to side 5 hurdles 3 laps each   Hurdle step with pace 2x20   Hallway walking head turns and nods 4x 100'   Changing speeds 4x 100'   Step onto air-ex and off/ fwd and lateral 2x10 each leg   Air-ex heel toe rocking   Punch 2 lbs 2x15  Bicpes curl 2x15 2lbs    Hurdle weave 4 laps    10/4  Performed 6 min walk test and strengthe measures FOTO perfomed last visit   Hurdle fwd and back step/ side to side 5 hurdles 3 laps each   Hurdle step with pace 2x20   Hallway walking head turns and nods 4x 100'   Changing speeds 4x 100'      9/26:  97%, 55bpm Nu step 5 min L3 Seated LAQ 2 x 15 each leg 3# Sit to stands 3x10 Gait without walker x227ft Romberg stance on airex with head turns/nods- 2x30sec ea SBA  Marching on airex 2x10 without UE  support Obstacle course along rail: Step up/over airex pad, x4 hurdles fwd/reciporcal, airex balance beam tandem walk- x2 laps. Cues to decrease UE support with 2nd set. CGA Step ups 6" x10ea  Stair climbing (1/2 flight reciprocal) Lateral hurdle step overs 4 hurdles x2 laps, light CGA  9/23:  BP 107/62 (normal per pt) Nu step 5 min L3 Seated LAQ 2 x 15 each leg 3# Sit to stands 3x10 5xSTS test Gait with walker x243ft Romberg stance on airex with head turns/nods- 2x30sec ea Light CGA  Step ups onto airex 2x10 Step ups 6" 2x5ea Hurdle step overs fwd- 4 hurdles x3 laps with and without hand rail. CGA)  9/19:  Seated march 2x30 GTB Seated hip abduction 2x30GTB Seated LAQ 2 x 15 each leg 3#   Bilateral bicep curl 2 pounds 2 x 20 Bilateral punch 2 pounds 2 x 20 TB row seated- 2x20 GTB  Sit to stands 3x10  9/12 5 min L4  Hurdles: 4 hurdles step over no UE support with CGA/SBA x4 laps 4 hurdles lateral 4 laps   Romberg stance on airex with EC- 2x30sec  Step ups onto airex 2x10 Sit to stands x20  Gait without walker  x237ft  Rhomberg with reaching across body- standing on airex x10ea  9/10 5 min L3   Hurdles: 5 hurdles step over no UE support with CGA x4 laps 5 hurdles backwards/ forwards 4 laps   Gait:  Long strides 100'  March 100'   Walk with head turns 50' Walk with head nod 50'  CGA -> min a with all   UE:   Bicpes curls 2 lbs 12x10 Punch 2 lbs 2x15  Press 2x15 2lbs   PATIENT EDUCATION:  Education details: HEP, symptom management, progression of activity  Person educated: Patient Education method: Explanation, Demonstration, Tactile cues, Verbal cues, and Handouts Education comprehension: verbalized understanding, returned demonstration, verbal cues required, tactile cues required, and needs further education  HOME EXERCISE PROGRAM: Access Code: [email or web address] URL: [email or web address] Date: 09/14/2022 Prepared by: Lorayne Bender  Exercises - Seated Knee Extension with Resistance  - 1 x daily - 7 x weekly - 3 sets - 15 reps - Seated Hip Abduction with Resistance  - 1 x daily - 7 x weekly - 3 sets - 15 reps - Seated Knee Lifts with Resistance  - 1 x daily - 7 x weekly - 3 sets - 10 reps  ASSESSMENT:  CLINICAL IMPRESSION: The patients breathing continues to improve. We were able to work on dynamic balance and movement activity without significant dyspnea. We will continue to progresses as tolerated.   OBJECTIVE IMPAIRMENTS: cardiopulmonary status limiting activity, difficulty walking, decreased strength, and impaired perceived functional ability.   ACTIVITY LIMITATIONS: standing, stairs, and locomotion level  PARTICIPATION LIMITATIONS: meal prep, cleaning, laundry, shopping, and community activity  PERSONAL FACTORS: Age and 1 comorbidity: multiple cardiovascular issues  are also affecting patient's functional outcome.   REHAB POTENTIAL: Good  CLINICAL DECISION MAKING: Evolving/moderate complexity  EVALUATION COMPLEXITY: Moderate   GOALS: Goals reviewed with patient? Yes  SHORT TERM GOALS: Target date:12/12/2022         Patient will increase gross bilateral lower extremity strength by 5 pounds Baseline: Goal status:  progressing 10/6  2.  Patient will increase 6-minute walk test distance by 300 feet and reduce rest breaks by 1 Baseline:  Goal status:    3.  Patient will decrease time on 5 times sit to stand test by 3 seconds Baseline:  Goal status: MET  10/6  4.  Patient will be independent with base exercise program Baseline:  Goal status: Has base exercise profram 10/6    LONG TERM GOALS: Target date:  12/26/2022      Patient will walk to her meal hall without increased dyspnea Baseline:  Goal status: Ongoing SOB with short distances without walker improving as of lately 10/6   2.  Patient will go up and down steps without increase shortness of breath in order to improve  community ambulation Baseline:  Goal status: Patient begging to practice stairs   3.  Patient will be able to go to the pool and back without significant fatigue Baseline:  Goal status: IN PROGRESS (remains significantly fatigued with this per pt) 9/23  4.    PLAN:  PT FREQUENCY: 1-2x/week  PT DURATION: 8 weeks  PLANNED INTERVENTIONS: Therapeutic exercises, Therapeutic activity, Neuromuscular re-education, Balance training, Gait training, Patient/Family education, Self Care, Stair training, DME instructions, Aquatic Therapy; Electrical stimulation, Cryotherapy, Moist heat, Taping, Manual therapy, and Re-evaluation.   PLAN FOR NEXT SESSION:  Consider light stair training; consider intervals on nu-step or bike; monitor HR. Patient can ambualte about 1:30 min wouout a rest break. Work on  increasing her time.  Consider gross exercise program.  Patient already has a strong home program to work on.   Dessie Coma, PT 11/19/2022, 12:53 PM

## 2022-11-20 ENCOUNTER — Encounter (HOSPITAL_BASED_OUTPATIENT_CLINIC_OR_DEPARTMENT_OTHER): Payer: Self-pay | Admitting: Physical Therapy

## 2022-11-23 ENCOUNTER — Other Ambulatory Visit: Payer: Self-pay

## 2022-11-23 ENCOUNTER — Other Ambulatory Visit (HOSPITAL_BASED_OUTPATIENT_CLINIC_OR_DEPARTMENT_OTHER): Payer: Self-pay

## 2022-11-23 ENCOUNTER — Other Ambulatory Visit (HOSPITAL_BASED_OUTPATIENT_CLINIC_OR_DEPARTMENT_OTHER): Payer: Self-pay | Admitting: Cardiovascular Disease

## 2022-11-23 MED ORDER — METOPROLOL SUCCINATE ER 50 MG PO TB24
75.0000 mg | ORAL_TABLET | Freq: Two times a day (BID) | ORAL | 3 refills | Status: DC
  Filled 2022-11-23: qty 270, 90d supply, fill #0
  Filled 2023-02-23: qty 270, 90d supply, fill #1
  Filled 2023-05-23: qty 270, 90d supply, fill #2
  Filled 2023-08-21: qty 270, 90d supply, fill #3

## 2022-11-24 ENCOUNTER — Encounter (HOSPITAL_BASED_OUTPATIENT_CLINIC_OR_DEPARTMENT_OTHER): Payer: Self-pay | Admitting: Physical Therapy

## 2022-11-24 ENCOUNTER — Ambulatory Visit (HOSPITAL_BASED_OUTPATIENT_CLINIC_OR_DEPARTMENT_OTHER): Payer: Medicare Other | Admitting: Physical Therapy

## 2022-11-24 DIAGNOSIS — M6281 Muscle weakness (generalized): Secondary | ICD-10-CM | POA: Diagnosis not present

## 2022-11-24 DIAGNOSIS — R2689 Other abnormalities of gait and mobility: Secondary | ICD-10-CM

## 2022-11-24 NOTE — Therapy (Signed)
OUTPATIENT PHYSICAL THERAPY LOWER EXTREMITY TREATMENT   Patient Name: Kathryn Gibson MRN: [id number] DOB:Apr 09, 1933, 89 y.o., female Today's Date: 11/24/2022  END OF SESSION:  PT End of Session - 11/24/22 1554     Visit Number 15    Number of Visits 25    Date for PT Re-Evaluation 12/26/22    PT Start Time 1515    PT Stop Time 1558    PT Time Calculation (min) 43 min    Activity Tolerance Patient tolerated treatment well    Behavior During Therapy [facility] for tasks assessed/performed                Progress Note Reporting Period 08/06 to 10/4  See note below for Objective Data and Assessment of Progress/Goals.          Past Medical History:  Diagnosis Date   Arthritis    Atrial fibrillation (HCC)    Atypical chest pain 09/22/2020   Blood transfusion without reported diagnosis    2017   Bradycardia    s/p PPM   CAD in native artery 03/26/2021   Cataract    Chronic diastolic heart failure (HCC) 03/26/2021   Colonic polyp    adenomatous   Constipation    Diverticulosis    Hemorrhoids    Hyperlipidemia    Hypertension    Hypothyroidism    Irritable bowel syndrome (IBS)    Lower extremity edema 09/22/2020   Osteoporosis    osteopenia   Pacemaker 2010   UTI (urinary tract infection)    Past Surgical History:  Procedure Laterality Date   BREAST CYST ASPIRATION Left 1980s   COLONOSCOPY     ELECTROPHYSIOLOGIC STUDY N/A 07/01/2015   Procedure: Atrial Fibrillation Ablation;  Surgeon: Hillis Range, MD;  Location: [facility] INVASIVE CV LAB;  Service: Cardiovascular;  Laterality: N/A;   EYE SURGERY     heart ablation     for atrial fib   HEMATOMA EVACUATION Right 07/15/2014   Procedure: EVACUATION Right Groin Hematoma;  Surgeon: Larina Earthly, MD;  Location: [facility] OR;  Service: Vascular;  Laterality: Right;   NASAL ENDOSCOPY WITH EPISTAXIS CONTROL N/A 07/14/2014   Procedure: NASAL ENDOSCOPY WITH EPISTAXIS CONTROL;  Surgeon: Melvenia Beam, MD;  Location[facility] OR;  Service: ENT;   Laterality: N/A;   NASAL ENDOSCOPY WITH EPISTAXIS CONTROL Bilateral 07/15/2014   Procedure: NASAL ENDOSCOPY WITH EPISTAXIS CONTROL;  Surgeon: Melvenia Beam, MD;  Location: [facility] OR;  Service: ENT;  Laterality: Bilateral;   PACEMAKER INSERTION  2010   implanted by Dr Deborah Chalk (MDT)   PPM GENERATOR CHANGEOUT N/A 08/16/2019   Procedure: PPM GENERATOR CHANGEOUT;  Surgeon: Hillis Range, MD;  Location: [facility] INVASIVE CV LAB;  Service: Cardiovascular;  Laterality: N/A;   RADIOLOGY WITH ANESTHESIA N/A 07/14/2014   Procedure: RADIOLOGY WITH ANESTHESIA;  Surgeon: Medication Radiologist, MD;  Location: [facility] OR;  Service: Radiology;  Laterality: N/A;   TEE WITHOUT CARDIOVERSION N/A 07/01/2015   Procedure: TRANSESOPHAGEAL ECHOCARDIOGRAM (TEE);  Surgeon: Quintella Reichert, MD;  Location: [facility] ENDOSCOPY;  Service: Cardiovascular;  Laterality: N/A;   Patient Active Problem List   Diagnosis Date Noted   Chronic diastolic heart failure (HCC) 03/26/2021   CAD in native artery 03/26/2021   Atypical chest pain 09/22/2020   Lower extremity edema 09/22/2020   Genital herpes simplex 09/18/2019   Fatigue 06/08/2018   [place] 12/14/2016   Rectal pain 11/12/2016   Ptosis of eyelid 04/05/2016   Hypokalemia    Acute blood loss anemia  Hypomagnesemia    Chronic atrial fibrillation (HCC)    Essential hypertension    Other specified hypothyroidism    Posterior epistaxis    Hematoma complicating a procedure    Diastolic dysfunction 07/14/2014   Epistaxis, recurrent 07/14/2014   Sick sinus syndrome (HCC) 04/29/2014   Encounter for therapeutic drug monitoring 04/04/2013   Sciatica of left side 11/22/2011   Aortic valve insufficiency 03/25/2011   Dyspnea 11/24/2010   Essential hypertension, benign 04/18/2010   Hypothyroidism 04/15/2010   PALPITATIONS 04/15/2010   BRADYCARDIA-TACHYCARDIA SYNDROME 12/11/2008   IRRITABLE BOWEL SYNDROME 12/11/2008   History of colonic polyps 12/11/2008   Constipation 11/13/2008   ABDOMINAL  BLOATING 11/13/2008   COAGULOPATHY 07/03/2008   Constipation 07/03/2008   CHANGE IN BOWELS 07/03/2008   Disorder of thyroid 07/02/2008   Diverticulosis of colon 07/02/2008    PCP: Dr Chilton Greathouse   REFERRING PROVIDER: [name] NP  REFERRING DIAG:   Diagnosis  R53.81 (ICD-10-CM) - Physical deconditioning    THERAPY DIAG:  Other abnormalities of gait and mobility  Muscle weakness (generalized)  Rationale for Evaluation and Treatment: Rehabilitation  ONSET DATE: Around the pandemic she began having shortness of breath   SUBJECTIVE:   SUBJECTIVE STATEMENT: The patient reports her fluid levels continue to improve. She has been getting short of breath at night. She feels like she is able to push more with therapy now that her medication levels are starting to work better.  PERTINENT HISTORY: A-fib, Chest pain 2022, lower extremity swelling, Pacemaker; Osteopenia,  PAIN:  Are you having pain?  General arthritis that comes and goes.   PRECAUTIONS: Pacemaker   RED FLAGS: None   WEIGHT BEARING RESTRICTIONS: No  FALLS:  Has patient fallen in last 6 months? No  LIVING ENVIRONMENT: Has steps she can walk but she doesn't do them much OCCUPATION:  Retired  Risk manager:  The pool, Walking, reading, going ot to dinner.   PLOF: Independent Uses a walker at this time for longer distances   PATIENT GOALS:  To get the strength in her legs back   NEXT MD VISIT: Nothing scheduled.   OBJECTIVE:   DIAGNOSTIC FINDINGS:  Nothing   PATIENT SURVEYS:  FOTO 55% at eval; 53% on 9/26  COGNITION: Overall cognitive status: Within functional limits for tasks assessed     SENSATION: WFL  EDEMA:  Bilateral LE edema controlled with ted hose and diuretics  MUSCLE LENGTH: POSTURE: No Significant postural limitations  PALPATION: No tenderness to plapation   LOWER EXTREMITY ROM:  Active ROM Right eval Left eval  Hip flexion    Hip extension    Hip abduction    Hip  adduction    Hip internal rotation    Hip external rotation    Knee flexion    Knee extension    Ankle dorsiflexion    Ankle plantarflexion    Ankle inversion    Ankle eversion     (Blank rows = not tested) within normal limits  LOWER EXTREMITY MMT:  MMT Right eval Left eval Right  10/4 Left 10/4  Hip flexion 15.5 14.8 19.3 17.3  Hip extension      Hip abduction 25.3 26.8 27.4 31.4  Hip adduction      Hip internal rotation      Hip external rotation      Knee flexion      Knee extension 23.9 18.5 30.1 23.7  Ankle dorsiflexion      Ankle plantarflexion      Ankle  inversion      Ankle eversion       (Blank rows = not tested) WNL  LOWER EXTREMITY SPECIAL TESTS:   FUNCTIONAL TESTS:  6 minute walk test: 643   1 min seated rest break at 425 ' HR not able to be obtained 2nd to cold fingers 5x sit to stand 13 sec; 9/23: 5xSTS: 8 seconds Base line HR 75 bpm  GAIT: Uses walker but does not put much weight on the walker    TODAY'S TREATMENT:                                                                                                                              DATE:   10/16 hurdle fwd and back step/ side to side 5 hurdles 3 laps each   Step onto air-ex and off/ fwd and lateral 2x10 each leg    Punch 2 lbs 2x15  Bicpes curl 2x15 2lbs   Hurdle weave 4 laps   Leg press 70 pounds 3x10  LF triceps press down 3x10  Row machine 3x10 30 lbs      10/12  Hurdle fwd and back step/ side to side 5 hurdles 3 laps each   Hurdle step with pace 2x20   Hallway walking head turns and nods 4x 100'   Changing speeds 4x 100'   Step onto air-ex and off/ fwd and lateral 2x10 each leg   Air-ex heel toe rocking   Punch 2 lbs 2x15  Bicpes curl 2x15 2lbs    Hurdle weave 4 laps    10/4  Performed 6 min walk test and strengthe measures FOTO perfomed last visit   Hurdle fwd and back step/ side to side 5 hurdles 3 laps each   Hurdle step with pace 2x20   Hallway  walking head turns and nods 4x 100'   Changing speeds 4x 100'      9/26:  97%, 55bpm Nu step 5 min L3 Seated LAQ 2 x 15 each leg 3# Sit to stands 3x10 Gait without walker x21ft Romberg stance on airex with head turns/nods- 2x30sec ea SBA  Marching on airex 2x10 without UE support Obstacle course along rail: Step up/over airex pad, x4 hurdles fwd/reciporcal, airex balance beam tandem walk- x2 laps. Cues to decrease UE support with 2nd set. CGA Step ups 6" x10ea  Stair climbing (1/2 flight reciprocal) Lateral hurdle step overs 4 hurdles x2 laps, light CGA  9/23:  BP 107/62 (normal per pt) Nu step 5 min L3 Seated LAQ 2 x 15 each leg 3# Sit to stands 3x10 5xSTS test Gait with walker x239ft Romberg stance on airex with head turns/nods- 2x30sec ea Light CGA  Step ups onto airex 2x10 Step ups 6" 2x5ea Hurdle step overs fwd- 4 hurdles x3 laps with and without hand rail. CGA)  9/19:  Seated march 2x30 GTB Seated hip abduction 2x30GTB Seated LAQ 2 x 15 each leg 3#   Bilateral bicep  curl 2 pounds 2 x 20 Bilateral punch 2 pounds 2 x 20 TB row seated- 2x20 GTB  Sit to stands 3x10  9/12 5 min L4  Hurdles: 4 hurdles step over no UE support with CGA/SBA x4 laps 4 hurdles lateral 4 laps   Romberg stance on airex with EC- 2x30sec  Step ups onto airex 2x10 Sit to stands x20  Gait without walker x263ft  Rhomberg with reaching across body- standing on airex x10ea  9/10 5 min L3   Hurdles: 5 hurdles step over no UE support with CGA x4 laps 5 hurdles backwards/ forwards 4 laps   Gait:  Long strides 100'  March 100'   Walk with head turns 50' Walk with head nod 50'  CGA -> min a with all   UE:   Bicpes curls 2 lbs 12x10 Punch 2 lbs 2x15  Press 2x15 2lbs   PATIENT EDUCATION:  Education details: HEP, symptom management, progression of activity  Person educated: Patient Education method: Explanation, Demonstration, Tactile cues, Verbal cues, and  Handouts Education comprehension: verbalized understanding, returned demonstration, verbal cues required, tactile cues required, and needs further education  HOME EXERCISE PROGRAM: Access Code: [email or web address] URL: [email or web address] Date: 09/14/2022 Prepared by: Lorayne Bender  Exercises - Seated Knee Extension with Resistance  - 1 x daily - 7 x weekly - 3 sets - 15 reps - Seated Hip Abduction with Resistance  - 1 x daily - 7 x weekly - 3 sets - 15 reps - Seated Knee Lifts with Resistance  - 1 x daily - 7 x weekly - 3 sets - 10 reps  ASSESSMENT:  CLINICAL IMPRESSION: The patient is making good progress. We had her work on Nurse, children's today. Prior to her endurance issues she was going to the gym and getting into the pool.    OBJECTIVE IMPAIRMENTS: cardiopulmonary status limiting activity, difficulty walking, decreased strength, and impaired perceived functional ability.   ACTIVITY LIMITATIONS: standing, stairs, and locomotion level  PARTICIPATION LIMITATIONS: meal prep, cleaning, laundry, shopping, and community activity  PERSONAL FACTORS: Age and 1 comorbidity: multiple cardiovascular issues  are also affecting patient's functional outcome.   REHAB POTENTIAL: Good  CLINICAL DECISION MAKING: Evolving/moderate complexity  EVALUATION COMPLEXITY: Moderate   GOALS: Goals reviewed with patient? Yes  SHORT TERM GOALS: Target date:12/12/2022         Patient will increase gross bilateral lower extremity strength by 5 pounds Baseline: Goal status:  progressing 10/6  2.  Patient will increase 6-minute walk test distance by 300 feet and reduce rest breaks by 1 Baseline:  Goal status:    3.  Patient will decrease time on 5 times sit to stand test by 3 seconds Baseline:  Goal status: MET  10/6  4.  Patient will be independent with base exercise program Baseline:  Goal status: Has base exercise profram 10/6    LONG TERM GOALS: Target date:   12/26/2022      Patient will walk to her meal hall without increased dyspnea Baseline:  Goal status: Ongoing SOB with short distances without walker improving as of lately 10/6   2.  Patient will go up and down steps without increase shortness of breath in order to improve community ambulation Baseline:  Goal status: Patient begging to practice stairs   3.  Patient will be able to go to the pool and back without significant fatigue Baseline:  Goal status: IN PROGRESS (remains significantly fatigued with this per pt) 9/23  4.    PLAN:  PT FREQUENCY: 1-2x/week  PT DURATION: 8 weeks  PLANNED INTERVENTIONS: Therapeutic exercises, Therapeutic activity, Neuromuscular re-education, Balance training, Gait training, Patient/Family education, Self Care, Stair training, DME instructions, Aquatic Therapy; Electrical stimulation, Cryotherapy, Moist heat, Taping, Manual therapy, and Re-evaluation.   PLAN FOR NEXT SESSION:  Consider light stair training; consider intervals on nu-step or bike; monitor HR. Patient can ambualte about 1:30 min wouout a rest break. Work on increasing her time.  Consider gross exercise program.  Patient already has a strong home program to work on.   Dessie Coma, PT 11/24/2022, 3:56 PM

## 2022-11-25 ENCOUNTER — Encounter (HOSPITAL_BASED_OUTPATIENT_CLINIC_OR_DEPARTMENT_OTHER): Payer: Self-pay | Admitting: Physical Therapy

## 2022-11-26 ENCOUNTER — Encounter (HOSPITAL_BASED_OUTPATIENT_CLINIC_OR_DEPARTMENT_OTHER): Payer: Self-pay

## 2022-11-26 ENCOUNTER — Ambulatory Visit (HOSPITAL_BASED_OUTPATIENT_CLINIC_OR_DEPARTMENT_OTHER): Payer: Medicare Other

## 2022-11-26 DIAGNOSIS — M6281 Muscle weakness (generalized): Secondary | ICD-10-CM

## 2022-11-26 DIAGNOSIS — R2689 Other abnormalities of gait and mobility: Secondary | ICD-10-CM | POA: Diagnosis not present

## 2022-11-26 NOTE — Therapy (Signed)
OUTPATIENT PHYSICAL THERAPY LOWER EXTREMITY TREATMENT   Patient Name: Kathryn Gibson MRN: [id number] DOB:11/13/1933, 89 y.o., female Today's Date: 11/26/2022  END OF SESSION:  PT End of Session - 11/26/22 1151     Visit Number 16    Number of Visits 25    Date for PT Re-Evaluation 12/26/22    PT Start Time 1148    PT Stop Time 1230    PT Time Calculation (min) 42 min    Equipment Utilized During Treatment Gait belt    Activity Tolerance Patient tolerated treatment well    Behavior During Therapy [facility] for tasks assessed/performed                 Progress Note Reporting Period 08/06 to 10/4  See note below for Objective Data and Assessment of Progress/Goals.          Past Medical History:  Diagnosis Date   Arthritis    Atrial fibrillation (HCC)    Atypical chest pain 09/22/2020   Blood transfusion without reported diagnosis    2017   Bradycardia    s/p PPM   CAD in native artery 03/26/2021   Cataract    Chronic diastolic heart failure (HCC) 03/26/2021   Colonic polyp    adenomatous   Constipation    Diverticulosis    Hemorrhoids    Hyperlipidemia    Hypertension    Hypothyroidism    Irritable bowel syndrome (IBS)    Lower extremity edema 09/22/2020   Osteoporosis    osteopenia   Pacemaker 2010   UTI (urinary tract infection)    Past Surgical History:  Procedure Laterality Date   BREAST CYST ASPIRATION Left 1980s   COLONOSCOPY     ELECTROPHYSIOLOGIC STUDY N/A 07/01/2015   Procedure: Atrial Fibrillation Ablation;  Surgeon: Hillis Range, MD;  Location: [facility] INVASIVE CV LAB;  Service: Cardiovascular;  Laterality: N/A;   EYE SURGERY     heart ablation     for atrial fib   HEMATOMA EVACUATION Right 07/15/2014   Procedure: EVACUATION Right Groin Hematoma;  Surgeon: Larina Earthly, MD;  Location: [facility] OR;  Service: Vascular;  Laterality: Right;   NASAL ENDOSCOPY WITH EPISTAXIS CONTROL N/A 07/14/2014   Procedure: NASAL ENDOSCOPY WITH EPISTAXIS CONTROL;  Surgeon:  Melvenia Beam, MD;  Location: [facility] OR;  Service: ENT;  Laterality: N/A;   NASAL ENDOSCOPY WITH EPISTAXIS CONTROL Bilateral 07/15/2014   Procedure: NASAL ENDOSCOPY WITH EPISTAXIS CONTROL;  Surgeon: Melvenia Beam, MD;  Location: [facility] OR;  Service: ENT;  Laterality: Bilateral;   PACEMAKER INSERTION  2010   implanted by Dr Deborah Chalk (MDT)   PPM GENERATOR CHANGEOUT N/A 08/16/2019   Procedure: PPM GENERATOR CHANGEOUT;  Surgeon: Hillis Range, MD;  Location: [facility] INVASIVE CV LAB;  Service: Cardiovascular;  Laterality: N/A;   RADIOLOGY WITH ANESTHESIA N/A 07/14/2014   Procedure: RADIOLOGY WITH ANESTHESIA;  Surgeon: Medication Radiologist, MD;  Location: [facility] OR;  Service: Radiology;  Laterality: N/A;   TEE WITHOUT CARDIOVERSION N/A 07/01/2015   Procedure: TRANSESOPHAGEAL ECHOCARDIOGRAM (TEE);  Surgeon: Quintella Reichert, MD;  Location: [facility] ENDOSCOPY;  Service: Cardiovascular;  Laterality: N/A;   Patient Active Problem List   Diagnosis Date Noted   Chronic diastolic heart failure (HCC) 03/26/2021   CAD in native artery 03/26/2021   Atypical chest pain 09/22/2020   Lower extremity edema 09/22/2020   Genital herpes simplex 09/18/2019   Fatigue 06/08/2018   [place] 12/14/2016   Rectal pain 11/12/2016   Ptosis of eyelid 04/05/2016   Hypokalemia  Acute blood loss anemia    Hypomagnesemia    Chronic atrial fibrillation (HCC)    Essential hypertension    Other specified hypothyroidism    Posterior epistaxis    Hematoma complicating a procedure    Diastolic dysfunction 07/14/2014   Epistaxis, recurrent 07/14/2014   Sick sinus syndrome (HCC) 04/29/2014   Encounter for therapeutic drug monitoring 04/04/2013   Sciatica of left side 11/22/2011   Aortic valve insufficiency 03/25/2011   Dyspnea 11/24/2010   Essential hypertension, benign 04/18/2010   Hypothyroidism 04/15/2010   PALPITATIONS 04/15/2010   BRADYCARDIA-TACHYCARDIA SYNDROME 12/11/2008   IRRITABLE BOWEL SYNDROME 12/11/2008   History of colonic polyps  12/11/2008   Constipation 11/13/2008   ABDOMINAL BLOATING 11/13/2008   COAGULOPATHY 07/03/2008   Constipation 07/03/2008   CHANGE IN BOWELS 07/03/2008   Disorder of thyroid 07/02/2008   Diverticulosis of colon 07/02/2008    PCP: Dr Chilton Greathouse   REFERRING PROVIDER: [name] NP  REFERRING DIAG:   Diagnosis  R53.81 (ICD-10-CM) - Physical deconditioning    THERAPY DIAG:  Other abnormalities of gait and mobility  Muscle weakness (generalized)  Rationale for Evaluation and Treatment: Rehabilitation  ONSET DATE: Around the pandemic she began having shortness of breath   SUBJECTIVE:   SUBJECTIVE STATEMENT: Pt reports " I was lazy yesterday, so I don't think I'll do good today." PERTINENT HISTORY: A-fib, Chest pain 2022, lower extremity swelling, Pacemaker; Osteopenia,  PAIN:  Are you having pain?  General arthritis that comes and goes.   PRECAUTIONS: Pacemaker   RED FLAGS: None   WEIGHT BEARING RESTRICTIONS: No  FALLS:  Has patient fallen in last 6 months? No  LIVING ENVIRONMENT: Has steps she can walk but she doesn't do them much OCCUPATION:  Retired  Risk manager:  The pool, Walking, reading, going ot to dinner.   PLOF: Independent Uses a walker at this time for longer distances   PATIENT GOALS:  To get the strength in her legs back   NEXT MD VISIT: Nothing scheduled.   OBJECTIVE:   DIAGNOSTIC FINDINGS:  Nothing   PATIENT SURVEYS:  FOTO 55% at eval; 53% on 9/26  COGNITION: Overall cognitive status: Within functional limits for tasks assessed     SENSATION: WFL  EDEMA:  Bilateral LE edema controlled with ted hose and diuretics  MUSCLE LENGTH: POSTURE: No Significant postural limitations  PALPATION: No tenderness to plapation   LOWER EXTREMITY ROM:  Active ROM Right eval Left eval  Hip flexion    Hip extension    Hip abduction    Hip adduction    Hip internal rotation    Hip external rotation    Knee flexion    Knee  extension    Ankle dorsiflexion    Ankle plantarflexion    Ankle inversion    Ankle eversion     (Blank rows = not tested) within normal limits  LOWER EXTREMITY MMT:  MMT Right eval Left eval Right  10/4 Left 10/4  Hip flexion 15.5 14.8 19.3 17.3  Hip extension      Hip abduction 25.3 26.8 27.4 31.4  Hip adduction      Hip internal rotation      Hip external rotation      Knee flexion      Knee extension 23.9 18.5 30.1 23.7  Ankle dorsiflexion      Ankle plantarflexion      Ankle inversion      Ankle eversion       (Blank rows = not  tested) WNL  LOWER EXTREMITY SPECIAL TESTS:   FUNCTIONAL TESTS:  6 minute walk test: 643   1 min seated rest break at 425 ' HR not able to be obtained 2nd to cold fingers 5x sit to stand 13 sec; 9/23: 5xSTS: 8 seconds Base line HR 75 bpm  GAIT: Uses walker but does not put much weight on the walker    TODAY'S TREATMENT:                                                                                                                              DATE:    10/18  Nu-step L4 hurdle fwd and step/ side to side 4 hurdles 3 laps each no rail use- light CGA when needed   Airex marching 2x10 Rhomberg on airex EC 30sec x2  Punch 2 lbs 2x15 3lb Bicpes curl 2x15 3lbs   Leg press 70 pounds 1x10 , 80lbs 3x10ea LF triceps press down 1x10; 1x8 30lbs  Row machine 3x10 20 lbs     10/16 hurdle fwd and back step/ side to side 5 hurdles 3 laps each   Step onto air-ex and off/ fwd and lateral 2x10 each leg    Punch 2 lbs 2x15  Bicpes curl 2x15 2lbs   Hurdle weave 4 laps   Leg press 70 pounds 3x10  LF triceps press down 3x10  Row machine 3x10 30 lbs        10/12  Hurdle fwd and back step/ side to side 5 hurdles 3 laps each   Hurdle step with pace 2x20   Hallway walking head turns and nods 4x 100'   Changing speeds 4x 100'   Step onto air-ex and off/ fwd and lateral 2x10 each leg   Air-ex heel toe rocking   Punch 2  lbs 2x15  Bicpes curl 2x15 2lbs    Hurdle weave 4 laps    10/4  Performed 6 min walk test and strengthe measures FOTO perfomed last visit   Hurdle fwd and back step/ side to side 5 hurdles 3 laps each   Hurdle step with pace 2x20   Hallway walking head turns and nods 4x 100'   Changing speeds 4x 100'      9/26:  97%, 55bpm Nu step 5 min L3 Seated LAQ 2 x 15 each leg 3# Sit to stands 3x10 Gait without walker x241ft Romberg stance on airex with head turns/nods- 2x30sec ea SBA  Marching on airex 2x10 without UE support Obstacle course along rail: Step up/over airex pad, x4 hurdles fwd/reciporcal, airex balance beam tandem walk- x2 laps. Cues to decrease UE support with 2nd set. CGA Step ups 6" x10ea  Stair climbing (1/2 flight reciprocal) Lateral hurdle step overs 4 hurdles x2 laps, light CGA  9/23:  BP 107/62 (normal per pt) Nu step 5 min L3 Seated LAQ 2 x 15 each leg 3# Sit to stands 3x10 5xSTS test Gait with walker x2108ft Romberg  stance on airex with head turns/nods- 2x30sec ea Light CGA  Step ups onto airex 2x10 Step ups 6" 2x5ea Hurdle step overs fwd- 4 hurdles x3 laps with and without hand rail. CGA)  9/19:  Seated march 2x30 GTB Seated hip abduction 2x30GTB Seated LAQ 2 x 15 each leg 3#   Bilateral bicep curl 2 pounds 2 x 20 Bilateral punch 2 pounds 2 x 20 TB row seated- 2x20 GTB  Sit to stands 3x10  9/12 5 min L4  Hurdles: 4 hurdles step over no UE support with CGA/SBA x4 laps 4 hurdles lateral 4 laps   Romberg stance on airex with EC- 2x30sec  Step ups onto airex 2x10 Sit to stands x20  Gait without walker x248ft  Rhomberg with reaching across body- standing on airex x10ea  PATIENT EDUCATION:  Education details: HEP, symptom management, progression of activity  Person educated: Patient Education method: Explanation, Demonstration, Tactile cues, Verbal cues, and Handouts Education comprehension: verbalized understanding, returned  demonstration, verbal cues required, tactile cues required, and needs further education  HOME EXERCISE PROGRAM: Access Code: [email or web address] URL: [email or web address] Date: 09/14/2022 Prepared by: Lorayne Bender  Exercises - Seated Knee Extension with Resistance  - 1 x daily - 7 x weekly - 3 sets - 15 reps - Seated Hip Abduction with Resistance  - 1 x daily - 7 x weekly - 3 sets - 15 reps - Seated Knee Lifts with Resistance  - 1 x daily - 7 x weekly - 3 sets - 10 reps  ASSESSMENT:  CLINICAL IMPRESSION: Pt able to increase weight on leg press today without complaint. Some difficulty with triceps press down by 3rd set of 10 as she fatigued. Also had to decrease weight with row machine to 20lbs due to difficulty with 30lbs.    OBJECTIVE IMPAIRMENTS: cardiopulmonary status limiting activity, difficulty walking, decreased strength, and impaired perceived functional ability.   ACTIVITY LIMITATIONS: standing, stairs, and locomotion level  PARTICIPATION LIMITATIONS: meal prep, cleaning, laundry, shopping, and community activity  PERSONAL FACTORS: Age and 1 comorbidity: multiple cardiovascular issues  are also affecting patient's functional outcome.   REHAB POTENTIAL: Good  CLINICAL DECISION MAKING: Evolving/moderate complexity  EVALUATION COMPLEXITY: Moderate   GOALS: Goals reviewed with patient? Yes  SHORT TERM GOALS: Target date:12/12/2022         Patient will increase gross bilateral lower extremity strength by 5 pounds Baseline: Goal status:  progressing 10/6  2.  Patient will increase 6-minute walk test distance by 300 feet and reduce rest breaks by 1 Baseline:  Goal status:    3.  Patient will decrease time on 5 times sit to stand test by 3 seconds Baseline:  Goal status: MET  10/6  4.  Patient will be independent with base exercise program Baseline:  Goal status: Has base exercise profram 10/6    LONG TERM GOALS: Target date:   12/26/2022      Patient will walk to her meal hall without increased dyspnea Baseline:  Goal status: Ongoing SOB with short distances without walker improving as of lately 10/6   2.  Patient will go up and down steps without increase shortness of breath in order to improve community ambulation Baseline:  Goal status: Patient begging to practice stairs   3.  Patient will be able to go to the pool and back without significant fatigue Baseline:  Goal status: IN PROGRESS (remains significantly fatigued with this per pt) 9/23  4.    PLAN:  PT FREQUENCY: 1-2x/week  PT  DURATION: 8 weeks  PLANNED INTERVENTIONS: Therapeutic exercises, Therapeutic activity, Neuromuscular re-education, Balance training, Gait training, Patient/Family education, Self Care, Stair training, DME instructions, Aquatic Therapy; Electrical stimulation, Cryotherapy, Moist heat, Taping, Manual therapy, and Re-evaluation.   PLAN FOR NEXT SESSION:  Consider light stair training; consider intervals on nu-step or bike; monitor HR. Patient can ambualte about 1:30 min wouout a rest break. Work on increasing her time.  Consider gross exercise program.  Patient already has a strong home program to work on.   Donnel Saxon Orestes Geiman, PTA 11/26/2022, 1:39 PM

## 2022-12-02 ENCOUNTER — Encounter (HOSPITAL_BASED_OUTPATIENT_CLINIC_OR_DEPARTMENT_OTHER): Payer: Self-pay | Admitting: Cardiovascular Disease

## 2022-12-02 ENCOUNTER — Other Ambulatory Visit (HOSPITAL_BASED_OUTPATIENT_CLINIC_OR_DEPARTMENT_OTHER): Payer: Self-pay

## 2022-12-02 NOTE — Telephone Encounter (Signed)
Kathryn Gibson, patient was referred back in [place], I can't tell if anyone has called to get her set up, can you reach out to get her scheduled

## 2022-12-03 ENCOUNTER — Ambulatory Visit (HOSPITAL_BASED_OUTPATIENT_CLINIC_OR_DEPARTMENT_OTHER): Payer: Medicare Other | Admitting: Physical Therapy

## 2022-12-03 ENCOUNTER — Encounter (HOSPITAL_BASED_OUTPATIENT_CLINIC_OR_DEPARTMENT_OTHER): Payer: Self-pay | Admitting: Physical Therapy

## 2022-12-03 DIAGNOSIS — R2689 Other abnormalities of gait and mobility: Secondary | ICD-10-CM

## 2022-12-03 DIAGNOSIS — M6281 Muscle weakness (generalized): Secondary | ICD-10-CM

## 2022-12-03 NOTE — Therapy (Signed)
OUTPATIENT PHYSICAL THERAPY LOWER EXTREMITY TREATMENT   Patient Name: Kathryn Gibson MRN: [id number] DOB:Aug 27, 1933, 89 y.o., female Today's Date: 12/03/2022  END OF SESSION:        Progress Note Reporting Period 08/06 to 10/4  See note below for Objective Data and Assessment of Progress/Goals.          Past Medical History:  Diagnosis Date   Arthritis    Atrial fibrillation (HCC)    Atypical chest pain 09/22/2020   Blood transfusion without reported diagnosis    2017   Bradycardia    s/p PPM   CAD in native artery 03/26/2021   Cataract    Chronic diastolic heart failure (HCC) 03/26/2021   Colonic polyp    adenomatous   Constipation    Diverticulosis    Hemorrhoids    Hyperlipidemia    Hypertension    Hypothyroidism    Irritable bowel syndrome (IBS)    Lower extremity edema 09/22/2020   Osteoporosis    osteopenia   Pacemaker 2010   UTI (urinary tract infection)    Past Surgical History:  Procedure Laterality Date   BREAST CYST ASPIRATION Left 1980s   COLONOSCOPY     ELECTROPHYSIOLOGIC STUDY N/A 07/01/2015   Procedure: Atrial Fibrillation Ablation;  Surgeon: Hillis Range, MD;  Location: [facility] INVASIVE CV LAB;  Service: Cardiovascular;  Laterality: N/A;   EYE SURGERY     heart ablation     for atrial fib   HEMATOMA EVACUATION Right 07/15/2014   Procedure: EVACUATION Right Groin Hematoma;  Surgeon: Larina Earthly, MD;  Location: [facility] OR;  Service: Vascular;  Laterality: Right;   NASAL ENDOSCOPY WITH EPISTAXIS CONTROL N/A 07/14/2014   Procedure: NASAL ENDOSCOPY WITH EPISTAXIS CONTROL;  Surgeon: Melvenia Beam, MD;  Location: [facility] OR;  Service: ENT;  Laterality: N/A;   NASAL ENDOSCOPY WITH EPISTAXIS CONTROL Bilateral 07/15/2014   Procedure: NASAL ENDOSCOPY WITH EPISTAXIS CONTROL;  Surgeon: Melvenia Beam, MD;  Location: [facility] OR;  Service: ENT;  Laterality: Bilateral;   PACEMAKER INSERTION  2010   implanted by Dr Deborah Chalk ([facility])   PPM GENERATOR CHANGEOUT N/A 08/16/2019    Procedure: PPM GENERATOR CHANGEOUT;  Surgeon: Hillis Range, MD;  Location: [facility] INVASIVE CV LAB;  Service: Cardiovascular;  Laterality: N/A;   RADIOLOGY WITH ANESTHESIA N/A 07/14/2014   Procedure: RADIOLOGY WITH ANESTHESIA;  Surgeon: Medication Radiologist, MD;  Location: [facility] OR;  Service: Radiology;  Laterality: N/A;   TEE WITHOUT CARDIOVERSION N/A 07/01/2015   Procedure: TRANSESOPHAGEAL ECHOCARDIOGRAM (TEE);  Surgeon: Quintella Reichert, MD;  Location: [facility] ENDOSCOPY;  Service: Cardiovascular;  Laterality: N/A;   Patient Active Problem List   Diagnosis Date Noted   Chronic diastolic heart failure (HCC) 03/26/2021   CAD in native artery 03/26/2021   Atypical chest pain 09/22/2020   Lower extremity edema 09/22/2020   Genital herpes simplex 09/18/2019   Fatigue 06/08/2018   [place] 12/14/2016   Rectal pain 11/12/2016   Ptosis of eyelid 04/05/2016   Hypokalemia    Acute blood loss anemia    Hypomagnesemia    Chronic atrial fibrillation (HCC)    Essential hypertension    Other specified hypothyroidism    Posterior epistaxis    Hematoma complicating a procedure    Diastolic dysfunction 07/14/2014   Epistaxis, recurrent 07/14/2014   Sick sinus syndrome (HCC) 04/29/2014   Encounter for therapeutic drug monitoring 04/04/2013   Sciatica of left side 11/22/2011   Aortic valve insufficiency 03/25/2011   Dyspnea 11/24/2010   Essential hypertension, benign 04/18/2010  Hypothyroidism 04/15/2010   PALPITATIONS 04/15/2010   BRADYCARDIA-TACHYCARDIA SYNDROME 12/11/2008   IRRITABLE BOWEL SYNDROME 12/11/2008   History of colonic polyps 12/11/2008   Constipation 11/13/2008   ABDOMINAL BLOATING 11/13/2008   COAGULOPATHY 07/03/2008   Constipation 07/03/2008   CHANGE IN BOWELS 07/03/2008   Disorder of thyroid 07/02/2008   Diverticulosis of colon 07/02/2008    PCP: Dr Chilton Greathouse   REFERRING PROVIDER: [name] NP  REFERRING DIAG:   Diagnosis  R53.81 (ICD-10-CM) - Physical deconditioning     THERAPY DIAG:  No diagnosis found.  Rationale for Evaluation and Treatment: Rehabilitation  ONSET DATE: Around the pandemic she began having shortness of breath   SUBJECTIVE:   SUBJECTIVE STATEMENT: Patient has no complaints. She reports some days she gets more tired. Some days she does well.  PERTINENT HISTORY: A-fib, Chest pain 2022, lower extremity swelling, Pacemaker; Osteopenia,  PAIN:  Are you having pain?  General arthritis that comes and goes.   PRECAUTIONS: Pacemaker   RED FLAGS: None   WEIGHT BEARING RESTRICTIONS: No  FALLS:  Has patient fallen in last 6 months? No  LIVING ENVIRONMENT: Has steps she can walk but she doesn't do them much OCCUPATION:  Retired  Risk manager:  The pool, Walking, reading, going ot to dinner.   PLOF: Independent Uses a walker at this time for longer distances   PATIENT GOALS:  To get the strength in her legs back   NEXT MD VISIT: Nothing scheduled.   OBJECTIVE:   DIAGNOSTIC FINDINGS:  Nothing   PATIENT SURVEYS:  FOTO 55% at eval; 53% on 9/26  COGNITION: Overall cognitive status: Within functional limits for tasks assessed     SENSATION: WFL  EDEMA:  Bilateral LE edema controlled with ted hose and diuretics  MUSCLE LENGTH: POSTURE: No Significant postural limitations  PALPATION: No tenderness to plapation   LOWER EXTREMITY ROM:  Active ROM Right eval Left eval  Hip flexion    Hip extension    Hip abduction    Hip adduction    Hip internal rotation    Hip external rotation    Knee flexion    Knee extension    Ankle dorsiflexion    Ankle plantarflexion    Ankle inversion    Ankle eversion     (Blank rows = not tested) within normal limits  LOWER EXTREMITY MMT:  MMT Right eval Left eval Right  10/4 Left 10/4  Hip flexion 15.5 14.8 19.3 17.3  Hip extension      Hip abduction 25.3 26.8 27.4 31.4  Hip adduction      Hip internal rotation      Hip external rotation      Knee flexion       Knee extension 23.9 18.5 30.1 23.7  Ankle dorsiflexion      Ankle plantarflexion      Ankle inversion      Ankle eversion       (Blank rows = not tested) WNL  LOWER EXTREMITY SPECIAL TESTS:   FUNCTIONAL TESTS:  6 minute walk test: 643   1 min seated rest break at 425 ' HR not able to be obtained 2nd to cold fingers 5x sit to stand 13 sec; 9/23: 5xSTS: 8 seconds Base line HR 75 bpm  GAIT: Uses walker but does not put much weight on the walker    TODAY'S TREATMENT:  DATE:   10/25 Nu-step L4 hurdle fwd and step/ side to side 4 hurdles 3 laps each no rail use- light CGA when needed   Airex marching 2x10 Rhomberg on airex EC 30sec x2  Hurdle fwd and back step/ side to side 5 hurdles 3 laps each   Hurdle step with pace 2x20   Hallway walking head turns and nods 4x 100'   Changing speeds 4x 100'   Step onto air-ex and off/ fwd and lateral 2x10 each leg   Air-ex heel toe rocking x20  Squats x20    10/18  Nu-step L4 hurdle fwd and step/ side to side 4 hurdles 3 laps each no rail use- light CGA when needed   Airex marching 2x10 Rhomberg on airex EC 30sec x2  Punch 2 lbs 2x15 3lb Bicpes curl 2x15 3lbs   Leg press 70 pounds 1x10 , 80lbs 3x10ea LF triceps press down 1x10; 1x8 30lbs  Row machine 3x10 20 lbs     10/16 hurdle fwd and back step/ side to side 5 hurdles 3 laps each   Step onto air-ex and off/ fwd and lateral 2x10 each leg    Punch 2 lbs 2x15  Bicpes curl 2x15 2lbs   Hurdle weave 4 laps   Leg press 70 pounds 3x10  LF triceps press down 3x10  Row machine 3x10 30 lbs        10/12  Hurdle fwd and back step/ side to side 5 hurdles 3 laps each   Hurdle step with pace 2x20   Hallway walking head turns and nods 4x 100'   Changing speeds 4x 100'   Step onto air-ex and off/ fwd and lateral 2x10 each leg    Air-ex heel toe rocking   Punch 2 lbs 2x15  Bicpes curl 2x15 2lbs    Hurdle weave 4 laps    10/4  Performed 6 min walk test and strengthe measures FOTO perfomed last visit   Hurdle fwd and back step/ side to side 5 hurdles 3 laps each   Hurdle step with pace 2x20   Hallway walking head turns and nods 4x 100'   Changing speeds 4x 100'      9/26:  97%, 55bpm Nu step 5 min L3 Seated LAQ 2 x 15 each leg 3# Sit to stands 3x10 Gait without walker x224ft Romberg stance on airex with head turns/nods- 2x30sec ea SBA  Marching on airex 2x10 without UE support Obstacle course along rail: Step up/over airex pad, x4 hurdles fwd/reciporcal, airex balance beam tandem walk- x2 laps. Cues to decrease UE support with 2nd set. CGA Step ups 6" x10ea  Stair climbing (1/2 flight reciprocal) Lateral hurdle step overs 4 hurdles x2 laps, light CGA  PATIENT EDUCATION:  Education details: HEP, symptom management, progression of activity  Person educated: Patient Education method: Explanation, Demonstration, Tactile cues, Verbal cues, and Handouts Education comprehension: verbalized understanding, returned demonstration, verbal cues required, tactile cues required, and needs further education  HOME EXERCISE PROGRAM: Access Code: [email or web address] URL: [email or web address] Date: 09/14/2022 Prepared by: Lorayne Bender  Exercises - Seated Knee Extension with Resistance  - 1 x daily - 7 x weekly - 3 sets - 15 reps - Seated Hip Abduction with Resistance  - 1 x daily - 7 x weekly - 3 sets - 15 reps - Seated Knee Lifts with Resistance  - 1 x daily - 7 x weekly - 3 sets - 10 reps  ASSESSMENT:  CLINICAL IMPRESSION: Continues to progress the  patient.  She tolerated treatment well today.  We focus more on dynamic mobility training today secondary to some soreness from exercises she had done to the few days prior.  She is able to go into full squat position with her squats.  She had no  loss of balance with her exercises but reported slight increase in difficulty with her balance exercises.  We will continue to progress as tolerated.  OBJECTIVE IMPAIRMENTS: cardiopulmonary status limiting activity, difficulty walking, decreased strength, and impaired perceived functional ability.   ACTIVITY LIMITATIONS: standing, stairs, and locomotion level  PARTICIPATION LIMITATIONS: meal prep, cleaning, laundry, shopping, and community activity  PERSONAL FACTORS: Age and 1 comorbidity: multiple cardiovascular issues  are also affecting patient's functional outcome.   REHAB POTENTIAL: Good  CLINICAL DECISION MAKING: Evolving/moderate complexity  EVALUATION COMPLEXITY: Moderate   GOALS: Goals reviewed with patient? Yes  SHORT TERM GOALS: Target date:12/12/2022         Patient will increase gross bilateral lower extremity strength by 5 pounds Baseline: Goal status:  progressing 10/6  2.  Patient will increase 6-minute walk test distance by 300 feet and reduce rest breaks by 1 Baseline:  Goal status:    3.  Patient will decrease time on 5 times sit to stand test by 3 seconds Baseline:  Goal status: MET  10/6  4.  Patient will be independent with base exercise program Baseline:  Goal status: Has base exercise profram 10/6    LONG TERM GOALS: Target date:  12/26/2022      Patient will walk to her meal hall without increased dyspnea Baseline:  Goal status: Ongoing SOB with short distances without walker improving as of lately 10/6   2.  Patient will go up and down steps without increase shortness of breath in order to improve community ambulation Baseline:  Goal status: Patient begging to practice stairs   3.  Patient will be able to go to the pool and back without significant fatigue Baseline:  Goal status: IN PROGRESS (remains significantly fatigued with this per pt) 9/23  4.    PLAN:  PT FREQUENCY: 1-2x/week  PT DURATION: 8 weeks  PLANNED  INTERVENTIONS: Therapeutic exercises, Therapeutic activity, Neuromuscular re-education, Balance training, Gait training, Patient/Family education, Self Care, Stair training, DME instructions, Aquatic Therapy; Electrical stimulation, Cryotherapy, Moist heat, Taping, Manual therapy, and Re-evaluation.   PLAN FOR NEXT SESSION:  Consider light stair training; consider intervals on nu-step or bike; monitor HR. Patient can ambualte about 1:30 min wouout a rest break. Work on increasing her time.  Consider gross exercise program.  Patient already has a strong home program to work on.   Dessie Coma, PT 12/03/2022, 12:23 PM

## 2022-12-06 ENCOUNTER — Ambulatory Visit (HOSPITAL_BASED_OUTPATIENT_CLINIC_OR_DEPARTMENT_OTHER): Payer: Medicare Other | Admitting: Physical Therapy

## 2022-12-06 DIAGNOSIS — M6281 Muscle weakness (generalized): Secondary | ICD-10-CM

## 2022-12-06 DIAGNOSIS — R2689 Other abnormalities of gait and mobility: Secondary | ICD-10-CM | POA: Diagnosis not present

## 2022-12-06 NOTE — Therapy (Signed)
OUTPATIENT PHYSICAL THERAPY LOWER EXTREMITY TREATMENT   Patient Name: Kathryn Gibson MRN: [id number] DOB:03-22-1933, 89 y.o., female Today's Date: 12/07/2022  END OF SESSION:  PT End of Session - 12/07/22 0800     Visit Number 18    Number of Visits 25    Date for PT Re-Evaluation 12/26/22    PT Start Time 1315    PT Stop Time 1355    PT Time Calculation (min) 40 min    Activity Tolerance Patient tolerated treatment well    Behavior During Therapy [facility] for tasks assessed/performed                  Progress Note Reporting Period 08/06 to 10/4  See note below for Objective Data and Assessment of Progress/Goals.          Past Medical History:  Diagnosis Date   Arthritis    Atrial fibrillation (HCC)    Atypical chest pain 09/22/2020   Blood transfusion without reported diagnosis    2017   Bradycardia    s/p PPM   CAD in native artery 03/26/2021   Cataract    Chronic diastolic heart failure (HCC) 03/26/2021   Colonic polyp    adenomatous   Constipation    Diverticulosis    Hemorrhoids    Hyperlipidemia    Hypertension    Hypothyroidism    Irritable bowel syndrome (IBS)    Lower extremity edema 09/22/2020   Osteoporosis    osteopenia   Pacemaker 2010   UTI (urinary tract infection)    Past Surgical History:  Procedure Laterality Date   BREAST CYST ASPIRATION Left 1980s   COLONOSCOPY     ELECTROPHYSIOLOGIC STUDY N/A 07/01/2015   Procedure: Atrial Fibrillation Ablation;  Surgeon: Hillis Range, MD;  Location: [facility] INVASIVE CV LAB;  Service: Cardiovascular;  Laterality: N/A;   EYE SURGERY     heart ablation     for atrial fib   HEMATOMA EVACUATION Right 07/15/2014   Procedure: EVACUATION Right Groin Hematoma;  Surgeon: Larina Earthly, MD;  Location: [facility] OR;  Service: Vascular;  Laterality: Right;   NASAL ENDOSCOPY WITH EPISTAXIS CONTROL N/A 07/14/2014   Procedure: NASAL ENDOSCOPY WITH EPISTAXIS CONTROL;  Surgeon: Melvenia Beam, MD;  Location: [facility] OR;  Service:  ENT;  Laterality: N/A;   NASAL ENDOSCOPY WITH EPISTAXIS CONTROL Bilateral 07/15/2014   Procedure: NASAL ENDOSCOPY WITH EPISTAXIS CONTROL;  Surgeon: Melvenia Beam, MD;  Location: [facility] OR;  Service: ENT;  Laterality: Bilateral;   PACEMAKER INSERTION  2010   implanted by Dr Deborah Chalk ([facility]T)   PPM GENERATOR CHANGEOUT N/A 08/16/2019   Procedure: PPM GENERATOR CHANGEOUT;  Surgeon: Hillis Range, MD;  Location: [facility] INVASIVE CV LAB;  Service: Cardiovascular;  Laterality: N/A;   RADIOLOGY WITH ANESTHESIA N/A 07/14/2014   Procedure: RADIOLOGY WITH ANESTHESIA;  Surgeon: Medication Radiologist, MD;  Location: [facility] OR;  Service: Radiology;  Laterality: N/A;   TEE WITHOUT CARDIOVERSION N/A 07/01/2015   Procedure: TRANSESOPHAGEAL ECHOCARDIOGRAM (TEE);  Surgeon: Quintella Reichert, MD;  Location: [facility] ENDOSCOPY;  Service: Cardiovascular;  Laterality: N/A;   Patient Active Problem List   Diagnosis Date Noted   Chronic diastolic heart failure (HCC) 03/26/2021   CAD in native artery 03/26/2021   Atypical chest pain 09/22/2020   Lower extremity edema 09/22/2020   Genital herpes simplex 09/18/2019   Fatigue 06/08/2018   [place] 12/14/2016   Rectal pain 11/12/2016   Ptosis of eyelid 04/05/2016   Hypokalemia    Acute blood loss anemia  Hypomagnesemia    Chronic atrial fibrillation (HCC)    Essential hypertension    Other specified hypothyroidism    Posterior epistaxis    Hematoma complicating a procedure    Diastolic dysfunction 07/14/2014   Epistaxis, recurrent 07/14/2014   Sick sinus syndrome (HCC) 04/29/2014   Encounter for therapeutic drug monitoring 04/04/2013   Sciatica of left side 11/22/2011   Aortic valve insufficiency 03/25/2011   Dyspnea 11/24/2010   Essential hypertension, benign 04/18/2010   Hypothyroidism 04/15/2010   PALPITATIONS 04/15/2010   BRADYCARDIA-TACHYCARDIA SYNDROME 12/11/2008   IRRITABLE BOWEL SYNDROME 12/11/2008   History of colonic polyps 12/11/2008   Constipation 11/13/2008   ABDOMINAL  BLOATING 11/13/2008   COAGULOPATHY 07/03/2008   Constipation 07/03/2008   CHANGE IN BOWELS 07/03/2008   Disorder of thyroid 07/02/2008   Diverticulosis of colon 07/02/2008    PCP: Dr Chilton Greathouse   REFERRING PROVIDER: [name] NP  REFERRING DIAG:   Diagnosis  R53.81 (ICD-10-CM) - Physical deconditioning    THERAPY DIAG:  Other abnormalities of gait and mobility  Muscle weakness (generalized)  Rationale for Evaluation and Treatment: Rehabilitation  ONSET DATE: Around the pandemic she began having shortness of breath   SUBJECTIVE:   SUBJECTIVE STATEMENT: The patient continues to work on her home program. She added in a box step for home.    PERTINENT HISTORY: A-fib, Chest pain 2022, lower extremity swelling, Pacemaker; Osteopenia,  PAIN:  Are you having pain?  General arthritis that comes and goes.   PRECAUTIONS: Pacemaker   RED FLAGS: None   WEIGHT BEARING RESTRICTIONS: No  FALLS:  Has patient fallen in last 6 months? No  LIVING ENVIRONMENT: Has steps she can walk but she doesn't do them much OCCUPATION:  Retired  Risk manager:  The pool, Walking, reading, going ot to dinner.   PLOF: Independent Uses a walker at this time for longer distances   PATIENT GOALS:  To get the strength in her legs back   NEXT MD VISIT: Nothing scheduled.   OBJECTIVE:   DIAGNOSTIC FINDINGS:  Nothing   PATIENT SURVEYS:  FOTO 55% at eval; 53% on 9/26  COGNITION: Overall cognitive status: Within functional limits for tasks assessed     SENSATION: WFL  EDEMA:  Bilateral LE edema controlled with ted hose and diuretics  MUSCLE LENGTH: POSTURE: No Significant postural limitations  PALPATION: No tenderness to plapation   LOWER EXTREMITY ROM:  Active ROM Right eval Left eval  Hip flexion    Hip extension    Hip abduction    Hip adduction    Hip internal rotation    Hip external rotation    Knee flexion    Knee extension    Ankle dorsiflexion     Ankle plantarflexion    Ankle inversion    Ankle eversion     (Blank rows = not tested) within normal limits  LOWER EXTREMITY MMT:  MMT Right eval Left eval Right  10/4 Left 10/4  Hip flexion 15.5 14.8 19.3 17.3  Hip extension      Hip abduction 25.3 26.8 27.4 31.4  Hip adduction      Hip internal rotation      Hip external rotation      Knee flexion      Knee extension 23.9 18.5 30.1 23.7  Ankle dorsiflexion      Ankle plantarflexion      Ankle inversion      Ankle eversion       (Blank rows = not tested) WNL  LOWER EXTREMITY SPECIAL TESTS:   FUNCTIONAL TESTS:  6 minute walk test: 643   1 min seated rest break at 425 ' HR not able to be obtained 2nd to cold fingers 5x sit to stand 13 sec; 9/23: 5xSTS: 8 seconds Base line HR 75 bpm  GAIT: Uses walker but does not put much weight on the walker    TODAY'S TREATMENT:                                                                                                                              DATE:   10/28 Nustep 5 min L4   LF knee extension 3x12 LF hip abdcution 55 lbs 3x12  LF row 15 lbs 3x15  LF hip abduction 3x10  Hallway walking head turns and nods 4x 100'   Changing speeds 4x 100'   Hallway side stepping 100'x2   10/25 Nu-step L4 hurdle fwd and step/ side to side 4 hurdles 3 laps each no rail use- light CGA when needed   Airex marching 2x10 Rhomberg on airex EC 30sec x2  Hurdle fwd and back step/ side to side 5 hurdles 3 laps each   Hurdle step with pace 2x20   Hallway walking head turns and nods 4x 100'   Changing speeds 4x 100'   Step onto air-ex and off/ fwd and lateral 2x10 each leg   Air-ex heel toe rocking x20  Squats x20    10/18  Nu-step L4 hurdle fwd and step/ side to side 4 hurdles 3 laps each no rail use- light CGA when needed   Airex marching 2x10 Rhomberg on airex EC 30sec x2  Punch 2 lbs 2x15 3lb Bicpes curl 2x15 3lbs   Leg press 70 pounds 1x10 , 80lbs  3x10ea LF triceps press down 1x10; 1x8 30lbs  Row machine 3x10 20 lbs     10/16 hurdle fwd and back step/ side to side 5 hurdles 3 laps each   Step onto air-ex and off/ fwd and lateral 2x10 each leg    Punch 2 lbs 2x15  Bicpes curl 2x15 2lbs   Hurdle weave 4 laps   Leg press 70 pounds 3x10  LF triceps press down 3x10  Row machine 3x10 30 lbs        10/12  Hurdle fwd and back step/ side to side 5 hurdles 3 laps each   Hurdle step with pace 2x20   Hallway walking head turns and nods 4x 100'   Changing speeds 4x 100'   Step onto air-ex and off/ fwd and lateral 2x10 each leg   Air-ex heel toe rocking   Punch 2 lbs 2x15  Bicpes curl 2x15 2lbs    Hurdle weave 4 laps    10/4  Performed 6 min walk test and strengthe measures FOTO perfomed last visit   Hurdle fwd and back step/ side to side 5 hurdles 3 laps each   Hurdle step with pace  2x20   Hallway walking head turns and nods 4x 100'   Changing speeds 4x 100'      9/26:  97%, 55bpm Nu step 5 min L3 Seated LAQ 2 x 15 each leg 3# Sit to stands 3x10 Gait without walker x261ft Romberg stance on airex with head turns/nods- 2x30sec ea SBA  Marching on airex 2x10 without UE support Obstacle course along rail: Step up/over airex pad, x4 hurdles fwd/reciporcal, airex balance beam tandem walk- x2 laps. Cues to decrease UE support with 2nd set. CGA Step ups 6" x10ea  Stair climbing (1/2 flight reciprocal) Lateral hurdle step overs 4 hurdles x2 laps, light CGA  PATIENT EDUCATION:  Education details: HEP, symptom management, progression of activity  Person educated: Patient Education method: Explanation, Demonstration, Tactile cues, Verbal cues, and Handouts Education comprehension: verbalized understanding, returned demonstration, verbal cues required, tactile cues required, and needs further education  HOME EXERCISE PROGRAM: Access Code: [email or web address] URL: [email or web address] Date:  09/14/2022 Prepared by: Lorayne Bender  Exercises - Seated Knee Extension with Resistance  - 1 x daily - 7 x weekly - 3 sets - 15 reps - Seated Hip Abduction with Resistance  - 1 x daily - 7 x weekly - 3 sets - 15 reps - Seated Knee Lifts with Resistance  - 1 x daily - 7 x weekly - 3 sets - 10 reps  ASSESSMENT:  CLINICAL IMPRESSION: The patient reported mild fatigue with long distance  side stepping. She otherwise tolerated treatment well. She is making good progress. She was able to complete 4 machines without difficulty. We will continue to progress back towards a full program. She was offered aquatics as this was something she did before. She will consider and get back to [place].   OBJECTIVE IMPAIRMENTS: cardiopulmonary status limiting activity, difficulty walking, decreased strength, and impaired perceived functional ability.   ACTIVITY LIMITATIONS: standing, stairs, and locomotion level  PARTICIPATION LIMITATIONS: meal prep, cleaning, laundry, shopping, and community activity  PERSONAL FACTORS: Age and 1 comorbidity: multiple cardiovascular issues  are also affecting patient's functional outcome.   REHAB POTENTIAL: Good  CLINICAL DECISION MAKING: Evolving/moderate complexity  EVALUATION COMPLEXITY: Moderate   GOALS: Goals reviewed with patient? Yes  SHORT TERM GOALS: Target date:12/12/2022         Patient will increase gross bilateral lower extremity strength by 5 pounds Baseline: Goal status:  progressing 10/6  2.  Patient will increase 6-minute walk test distance by 300 feet and reduce rest breaks by 1 Baseline:  Goal status:    3.  Patient will decrease time on 5 times sit to stand test by 3 seconds Baseline:  Goal status: MET  10/6  4.  Patient will be independent with base exercise program Baseline:  Goal status: Has base exercise profram 10/6    LONG TERM GOALS: Target date:  12/26/2022      Patient will walk to her meal hall without increased  dyspnea Baseline:  Goal status: Ongoing SOB with short distances without walker improving as of lately 10/6   2.  Patient will go up and down steps without increase shortness of breath in order to improve community ambulation Baseline:  Goal status: Patient begging to practice stairs   3.  Patient will be able to go to the pool and back without significant fatigue Baseline:  Goal status: IN PROGRESS (remains significantly fatigued with this per pt) 9/23  4.    PLAN:  PT FREQUENCY: 1-2x/week  PT DURATION: 8 weeks  PLANNED INTERVENTIONS: Therapeutic  exercises, Therapeutic activity, Neuromuscular re-education, Balance training, Gait training, Patient/Family education, Self Care, Stair training, DME instructions, Aquatic Therapy; Electrical stimulation, Cryotherapy, Moist heat, Taping, Manual therapy, and Re-evaluation.   PLAN FOR NEXT SESSION:  Consider light stair training; consider intervals on nu-step or bike; monitor HR. Patient can ambualte about 1:30 min wouout a rest break. Work on increasing her time.  Consider gross exercise program.  Patient already has a strong home program to work on.   Dessie Coma, PT 12/07/2022, 8:09 AM

## 2022-12-07 ENCOUNTER — Encounter (HOSPITAL_BASED_OUTPATIENT_CLINIC_OR_DEPARTMENT_OTHER): Payer: Self-pay | Admitting: Physical Therapy

## 2022-12-10 ENCOUNTER — Ambulatory Visit (HOSPITAL_BASED_OUTPATIENT_CLINIC_OR_DEPARTMENT_OTHER): Payer: Medicare Other | Attending: Internal Medicine | Admitting: Physical Therapy

## 2022-12-10 DIAGNOSIS — R2689 Other abnormalities of gait and mobility: Secondary | ICD-10-CM | POA: Insufficient documentation

## 2022-12-10 DIAGNOSIS — M6281 Muscle weakness (generalized): Secondary | ICD-10-CM | POA: Diagnosis not present

## 2022-12-10 NOTE — Therapy (Signed)
OUTPATIENT PHYSICAL THERAPY LOWER EXTREMITY TREATMENT   Patient Name: Kathryn Gibson MRN: [id number] DOB:[birth year], 89 y.o., female Today's Date: 12/10/2022  END OF SESSION:  PT End of Session - 12/10/22 1408     Visit Number 19    Number of Visits 25    Date for PT Re-Evaluation 12/26/22    PT Start Time 1255    PT Stop Time 1340    PT Time Calculation (min) 45 min                   Progress Note Reporting Period 08/06 to 10/4  See note below for Objective Data and Assessment of Progress/Goals.          Past Medical History:  Diagnosis Date   Arthritis    Atrial fibrillation (HCC)    Atypical chest pain 09/22/2020   Blood transfusion without reported diagnosis    2017   Bradycardia    s/p PPM   CAD in native artery 03/26/2021   Cataract    Chronic diastolic heart failure (HCC) 03/26/2021   Colonic polyp    adenomatous   Constipation    Diverticulosis    Hemorrhoids    Hyperlipidemia    Hypertension    Hypothyroidism    Irritable bowel syndrome (IBS)    Lower extremity edema 09/22/2020   Osteoporosis    osteopenia   Pacemaker 2010   UTI (urinary tract infection)    Past Surgical History:  Procedure Laterality Date   BREAST CYST ASPIRATION Left 1980s   COLONOSCOPY     ELECTROPHYSIOLOGIC STUDY N/A 07/01/2015   Procedure: Atrial Fibrillation Ablation;  Surgeon: Hillis Range, MD;  Location: [facility] INVASIVE CV LAB;  Service: Cardiovascular;  Laterality: N/A;   EYE SURGERY     heart ablation     for atrial fib   HEMATOMA EVACUATION Right 07/15/2014   Procedure: EVACUATION Right Groin Hematoma;  Surgeon: Larina Earthly, MD;  Location: [facility] OR;  Service: Vascular;  Laterality: Right;   NASAL ENDOSCOPY WITH EPISTAXIS CONTROL N/A 07/14/2014   Procedure: NASAL ENDOSCOPY WITH EPISTAXIS CONTROL;  Surgeon: Melvenia Beam, MD;  Location[facility] OR;  Service: ENT;  Laterality: N/A;   NASAL ENDOSCOPY WITH EPISTAXIS CONTROL Bilateral 07/15/2014   Procedure: NASAL ENDOSCOPY  WITH EPISTAXIS CONTROL;  Surgeon: Melvenia Beam, MD;  Location: [facility] OR;  Service: ENT;  Laterality: Bilateral;   PACEMAKER INSERTION  2010   implanted by Dr Deborah Chalk (MDT)   PPM GENERATOR CHANGEOUT N/A 08/16/2019   Procedure: PPM GENERATOR CHANGEOUT;  Surgeon: Hillis Range, MD;  Location: [facility] INVASIVE CV LAB;  Service: Cardiovascular;  Laterality: N/A;   RADIOLOGY WITH ANESTHESIA N/A 07/14/2014   Procedure: RADIOLOGY WITH ANESTHESIA;  Surgeon: Medication Radiologist, MD;  Location: [facility] OR;  Service: Radiology;  Laterality: N/A;   TEE WITHOUT CARDIOVERSION N/A 07/01/2015   Procedure: TRANSESOPHAGEAL ECHOCARDIOGRAM (TEE);  Surgeon: Quintella Reichert, MD;  Location: [facility] ENDOSCOPY;  Service: Cardiovascular;  Laterality: N/A;   Patient Active Problem List   Diagnosis Date Noted   Chronic diastolic heart failure (HCC) 03/26/2021   CAD in native artery 03/26/2021   Atypical chest pain 09/22/2020   Lower extremity edema 09/22/2020   Genital herpes simplex 09/18/2019   Fatigue 06/08/2018   Ozena 12/14/2016   Rectal pain 11/12/2016   Ptosis of eyelid 04/05/2016   Hypokalemia    Acute blood loss anemia    Hypomagnesemia    Chronic atrial fibrillation (HCC)    Essential hypertension  Other specified hypothyroidism    Posterior epistaxis    Hematoma complicating a procedure    Diastolic dysfunction 07/14/2014   Epistaxis, recurrent 07/14/2014   Sick sinus syndrome (HCC) 04/29/2014   Encounter for therapeutic drug monitoring 04/04/2013   Sciatica of left side 11/22/2011   Aortic valve insufficiency 03/25/2011   Dyspnea 11/24/2010   Essential hypertension, benign 04/18/2010   Hypothyroidism 04/15/2010   PALPITATIONS 04/15/2010   BRADYCARDIA-TACHYCARDIA SYNDROME 12/11/2008   IRRITABLE BOWEL SYNDROME 12/11/2008   History of colonic polyps 12/11/2008   Constipation 11/13/2008   ABDOMINAL BLOATING 11/13/2008   COAGULOPATHY 07/03/2008   Constipation 07/03/2008   CHANGE IN BOWELS 07/03/2008    Disorder of thyroid 07/02/2008   Diverticulosis of colon 07/02/2008    PCP: Dr Chilton Greathouse   REFERRING PROVIDER: [name] NP  REFERRING DIAG:   Diagnosis  R53.81 (ICD-10-CM) - Physical deconditioning    THERAPY DIAG:  Other abnormalities of gait and mobility  Muscle weakness (generalized)  Rationale for Evaluation and Treatment: Rehabilitation  ONSET DATE: Around the pandemic she began having shortness of breath   SUBJECTIVE:   SUBJECTIVE STATEMENT: The patient reports she was able to walk better from her apartment down to her dinning hall.    PERTINENT HISTORY: A-fib, Chest pain 2022, lower extremity swelling, Pacemaker; Osteopenia,  PAIN:  Are you having pain?  General arthritis that comes and goes.   PRECAUTIONS: Pacemaker   RED FLAGS: None   WEIGHT BEARING RESTRICTIONS: No  FALLS:  Has patient fallen in last 6 months? No  LIVING ENVIRONMENT: Has steps she can walk but she doesn't do them much OCCUPATION:  Retired  Risk manager:  The pool, Walking, reading, going ot to dinner.   PLOF: Independent Uses a walker at this time for longer distances   PATIENT GOALS:  To get the strength in her legs back   NEXT MD VISIT: Nothing scheduled.   OBJECTIVE:   DIAGNOSTIC FINDINGS:  Nothing   PATIENT SURVEYS:  FOTO 55% at eval; 53% on 9/26  COGNITION: Overall cognitive status: Within functional limits for tasks assessed     SENSATION: WFL  EDEMA:  Bilateral LE edema controlled with ted hose and diuretics  MUSCLE LENGTH: POSTURE: No Significant postural limitations  PALPATION: No tenderness to plapation   LOWER EXTREMITY ROM:  Active ROM Right eval Left eval  Hip flexion    Hip extension    Hip abduction    Hip adduction    Hip internal rotation    Hip external rotation    Knee flexion    Knee extension    Ankle dorsiflexion    Ankle plantarflexion    Ankle inversion    Ankle eversion     (Blank rows = not tested) within  normal limits  LOWER EXTREMITY MMT:  MMT Right eval Left eval Right  10/4 Left 10/4  Hip flexion 15.5 14.8 19.3 17.3  Hip extension      Hip abduction 25.3 26.8 27.4 31.4  Hip adduction      Hip internal rotation      Hip external rotation      Knee flexion      Knee extension 23.9 18.5 30.1 23.7  Ankle dorsiflexion      Ankle plantarflexion      Ankle inversion      Ankle eversion       (Blank rows = not tested) WNL  LOWER EXTREMITY SPECIAL TESTS:   FUNCTIONAL TESTS:  6 minute walk test: 643  1 min seated rest break at 425 ' HR not able to be obtained 2nd to cold fingers 5x sit to stand 13 sec; 9/23: 5xSTS: 8 seconds Base line HR 75 bpm  GAIT: Uses walker but does not put much weight on the walker    TODAY'S TREATMENT:                                                                                                                              DATE:   11/1 Nustep 5 min L4  LF tricpes 3x10 20 lbs  LF row 3x12 20 lbs  Cybex leg press 3x12 80 lbs    hurdle fwd and back step/ side to side 5 hurdles 3 laps each    10/28 Nustep 5 min L4   LF knee extension 3x12 10 lbs  LF hip abdcution 55 lbs 3x12  LF row 15 lbs 3x15    Hallway walking head turns and nods 4x 100'   Changing speeds 4x 100'   Hallway side stepping 100'x2   10/25 Nu-step L4 hurdle fwd and step/ side to side 4 hurdles 3 laps each no rail use- light CGA when needed   Airex marching 2x10 Rhomberg on airex EC 30sec x2  Hurdle fwd and back step/ side to side 5 hurdles 3 laps each   Hurdle step with pace 2x20   Hallway walking head turns and nods 4x 100'   Changing speeds 4x 100'   Step onto air-ex and off/ fwd and lateral 2x10 each leg   Air-ex heel toe rocking x20  Squats x20    10/18  Nu-step L4 hurdle fwd and step/ side to side 4 hurdles 3 laps each no rail use- light CGA when needed   Airex marching 2x10 Rhomberg on airex EC 30sec x2  Punch 2 lbs 2x15  3lb Bicpes curl 2x15 3lbs   Leg press 70 pounds 1x10 , 80lbs 3x10ea LF triceps press down 1x10; 1x8 30lbs  Row machine 3x10 20 lbs     10/16 hurdle fwd and back step/ side to side 5 hurdles 3 laps each   Step onto air-ex and off/ fwd and lateral 2x10 each leg    Punch 2 lbs 2x15  Bicpes curl 2x15 2lbs   Hurdle weave 4 laps   Leg press 70 pounds 3x10  LF triceps press down 3x10  Row machine 3x10 30 lbs        10/12  Hurdle fwd and back step/ side to side 5 hurdles 3 laps each   Hurdle step with pace 2x20   Hallway walking head turns and nods 4x 100'   Changing speeds 4x 100'   Step onto air-ex and off/ fwd and lateral 2x10 each leg   Air-ex heel toe rocking   Punch 2 lbs 2x15  Bicpes curl 2x15 2lbs    Hurdle weave 4 laps     PATIENT EDUCATION:  Education details: HEP, symptom  management, progression of activity  Person educated: Patient Education method: Explanation, Demonstration, Tactile cues, Verbal cues, and Handouts Education comprehension: verbalized understanding, returned demonstration, verbal cues required, tactile cues required, and needs further education  HOME EXERCISE PROGRAM: Access Code: [email or web address] URL: [email or web address] Date: 09/14/2022 Prepared by: Lorayne Bender  Exercises - Seated Knee Extension with Resistance  - 1 x daily - 7 x weekly - 3 sets - 15 reps - Seated Hip Abduction with Resistance  - 1 x daily - 7 x weekly - 3 sets - 15 reps - Seated Knee Lifts with Resistance  - 1 x daily - 7 x weekly - 3 sets - 10 reps  ASSESSMENT:  CLINICAL IMPRESSION:  The patient is making great progress. She continues to do well with her gym exercises. She was able to self adjust her weights today to keep her RPE around 5-6. We also worked on hurdle exercises. She required slightly more guarding today with the hurdles. We talked to her about thinnking about what her program will look like in a few weeks as we work towards  discharge She is thinking about starting to go to the gym at [employer].   The patient continues to progress   OBJECTIVE IMPAIRMENTS: cardiopulmonary status limiting activity, difficulty walking, decreased strength, and impaired perceived functional ability.   ACTIVITY LIMITATIONS: standing, stairs, and locomotion level  PARTICIPATION LIMITATIONS: meal prep, cleaning, laundry, shopping, and community activity  PERSONAL FACTORS: Age and 1 comorbidity: multiple cardiovascular issues  are also affecting patient's functional outcome.   REHAB POTENTIAL: Good  CLINICAL DECISION MAKING: Evolving/moderate complexity  EVALUATION COMPLEXITY: Moderate   GOALS: Goals reviewed with patient? Yes  SHORT TERM GOALS: Target date:12/12/2022         Patient will increase gross bilateral lower extremity strength by 5 pounds Baseline: Goal status:  progressing 10/6  2.  Patient will increase 6-minute walk test distance by 300 feet and reduce rest breaks by 1 Baseline:  Goal status:    3.  Patient will decrease time on 5 times sit to stand test by 3 seconds Baseline:  Goal status: MET  10/6  4.  Patient will be independent with base exercise program Baseline:  Goal status: Has base exercise profram 10/6    LONG TERM GOALS: Target date:  12/26/2022      Patient will walk to her meal hall without increased dyspnea Baseline:  Goal status: Ongoing SOB with short distances without walker improving as of lately 10/6   2.  Patient will go up and down steps without increase shortness of breath in order to improve community ambulation Baseline:  Goal status: Patient begging to practice stairs   3.  Patient will be able to go to the pool and back without significant fatigue Baseline:  Goal status: IN PROGRESS (remains significantly fatigued with this per pt) 9/23  4.    PLAN:  PT FREQUENCY: 1-2x/week  PT DURATION: 8 weeks  PLANNED INTERVENTIONS: Therapeutic exercises,  Therapeutic activity, Neuromuscular re-education, Balance training, Gait training, Patient/Family education, Self Care, Stair training, DME instructions, Aquatic Therapy; Electrical stimulation, Cryotherapy, Moist heat, Taping, Manual therapy, and Re-evaluation.   PLAN FOR NEXT SESSION:  Consider light stair training; consider intervals on nu-step or bike; monitor HR. Patient can ambualte about 1:30 min wouout a rest break. Work on increasing her time.  Consider gross exercise program.  Patient already has a strong home program to work on.   Dessie Coma, PT 12/10/2022, 2:10 PM

## 2022-12-13 ENCOUNTER — Other Ambulatory Visit (HOSPITAL_BASED_OUTPATIENT_CLINIC_OR_DEPARTMENT_OTHER): Payer: Self-pay

## 2022-12-13 MED ORDER — ZOLPIDEM TARTRATE 5 MG PO TABS
5.0000 mg | ORAL_TABLET | Freq: Every day | ORAL | 0 refills | Status: DC
  Filled 2022-12-13: qty 30, 30d supply, fill #0

## 2022-12-15 ENCOUNTER — Ambulatory Visit (HOSPITAL_BASED_OUTPATIENT_CLINIC_OR_DEPARTMENT_OTHER): Payer: Medicare Other | Admitting: Physical Therapy

## 2022-12-15 ENCOUNTER — Encounter (HOSPITAL_BASED_OUTPATIENT_CLINIC_OR_DEPARTMENT_OTHER): Payer: Self-pay | Admitting: Physical Therapy

## 2022-12-15 DIAGNOSIS — R2689 Other abnormalities of gait and mobility: Secondary | ICD-10-CM | POA: Diagnosis not present

## 2022-12-15 DIAGNOSIS — M6281 Muscle weakness (generalized): Secondary | ICD-10-CM | POA: Diagnosis not present

## 2022-12-15 NOTE — Therapy (Unsigned)
OUTPATIENT PHYSICAL THERAPY LOWER EXTREMITY TREATMENT   Patient Name: Kathryn Gibson MRN: [id number] DOB:Jan 14, 1934, 89 y.o., female Today's Date: 12/15/2022  END OF SESSION:  PT End of Session - 12/15/22 1302     Visit Number 20    Number of Visits 25    Date for PT Re-Evaluation 12/26/22    Authorization Type progress note in 3 visits    PT Start Time 1300    PT Stop Time 1342    PT Time Calculation (min) 42 min    Activity Tolerance Patient tolerated treatment well    Behavior During Therapy [facility] for tasks assessed/performed                   Progress Note Reporting Period 08/06 to 10/4  See note below for Objective Data and Assessment of Progress/Goals.          Past Medical History:  Diagnosis Date   Arthritis    Atrial fibrillation (HCC)    Atypical chest pain 09/22/2020   Blood transfusion without reported diagnosis    2017   Bradycardia    s/p PPM   CAD in native artery 03/26/2021   Cataract    Chronic diastolic heart failure (HCC) 03/26/2021   Colonic polyp    adenomatous   Constipation    Diverticulosis    Hemorrhoids    Hyperlipidemia    Hypertension    Hypothyroidism    Irritable bowel syndrome (IBS)    Lower extremity edema 09/22/2020   Osteoporosis    osteopenia   Pacemaker 2010   UTI (urinary tract infection)    Past Surgical History:  Procedure Laterality Date   BREAST CYST ASPIRATION Left 1980s   COLONOSCOPY     ELECTROPHYSIOLOGIC STUDY N/A 07/01/2015   Procedure: Atrial Fibrillation Ablation;  Surgeon: Hillis Range, MD;  Location: [facility] INVASIVE CV LAB;  Service: Cardiovascular;  Laterality: N/A;   EYE SURGERY     heart ablation     for atrial fib   HEMATOMA EVACUATION Right 07/15/2014   Procedure: EVACUATION Right Groin Hematoma;  Surgeon: Larina Earthly, MD;  Location: [facility] OR;  Service: Vascular;  Laterality: Right;   NASAL ENDOSCOPY WITH EPISTAXIS CONTROL N/A 07/14/2014   Procedure: NASAL ENDOSCOPY WITH EPISTAXIS CONTROL;   Surgeon: Melvenia Beam, MD;  Location: [facility] OR;  Service: ENT;  Laterality: N/A;   NASAL ENDOSCOPY WITH EPISTAXIS CONTROL Bilateral 07/15/2014   Procedure: NASAL ENDOSCOPY WITH EPISTAXIS CONTROL;  Surgeon: Melvenia Beam, MD;  Location: [facility] OR;  Service: ENT;  Laterality: Bilateral;   PACEMAKER INSERTION  2010   implanted by Dr Deborah Chalk (MDT)   PPM GENERATOR CHANGEOUT N/A 08/16/2019   Procedure: PPM GENERATOR CHANGEOUT;  Surgeon: Hillis Range, MD;  Location: [facility] INVASIVE CV LAB;  Service: Cardiovascular;  Laterality: N/A;   RADIOLOGY WITH ANESTHESIA N/A 07/14/2014   Procedure: RADIOLOGY WITH ANESTHESIA;  Surgeon: Medication Radiologist, MD;  Location: [facility] OR;  Service: Radiology;  Laterality: N/A;   TEE WITHOUT CARDIOVERSION N/A 07/01/2015   Procedure: TRANSESOPHAGEAL ECHOCARDIOGRAM (TEE);  Surgeon: Quintella Reichert, MD;  Location: [facility] ENDOSCOPY;  Service: Cardiovascular;  Laterality: N/A;   Patient Active Problem List   Diagnosis Date Noted   Chronic diastolic heart failure (HCC) 03/26/2021   CAD in native artery 03/26/2021   Atypical chest pain 09/22/2020   Lower extremity edema 09/22/2020   Genital herpes simplex 09/18/2019   Fatigue 06/08/2018   Ozena 12/14/2016   Rectal pain 11/12/2016   Ptosis of eyelid 04/05/2016  Hypokalemia    Acute blood loss anemia    Hypomagnesemia    Chronic atrial fibrillation (HCC)    Essential hypertension    Other specified hypothyroidism    Posterior epistaxis    Hematoma complicating a procedure    Diastolic dysfunction 07/14/2014   Epistaxis, recurrent 07/14/2014   Sick sinus syndrome (HCC) 04/29/2014   Encounter for therapeutic drug monitoring 04/04/2013   Sciatica of left side 11/22/2011   Aortic valve insufficiency 03/25/2011   Dyspnea 11/24/2010   Essential hypertension, benign 04/18/2010   Hypothyroidism 04/15/2010   PALPITATIONS 04/15/2010   BRADYCARDIA-TACHYCARDIA SYNDROME 12/11/2008   IRRITABLE BOWEL SYNDROME 12/11/2008   History of colonic  polyps 12/11/2008   Constipation 11/13/2008   ABDOMINAL BLOATING 11/13/2008   COAGULOPATHY 07/03/2008   Constipation 07/03/2008   CHANGE IN BOWELS 07/03/2008   Disorder of thyroid 07/02/2008   Diverticulosis of colon 07/02/2008    PCP: Dr Chilton Greathouse   REFERRING PROVIDER: [name] NP  REFERRING DIAG:   Diagnosis  R53.81 (ICD-10-CM) - Physical deconditioning    THERAPY DIAG:  Other abnormalities of gait and mobility  Muscle weakness (generalized)  Rationale for Evaluation and Treatment: Rehabilitation  ONSET DATE: Around the pandemic she began having shortness of breath   SUBJECTIVE:   SUBJECTIVE STATEMENT: The patient reports she was able to walk better from her apartment down to her dinning hall.    PERTINENT HISTORY: A-fib, Chest pain 2022, lower extremity swelling, Pacemaker; Osteopenia,  PAIN:  Are you having pain?  General arthritis that comes and goes.   PRECAUTIONS: Pacemaker   RED FLAGS: None   WEIGHT BEARING RESTRICTIONS: No  FALLS:  Has patient fallen in last 6 months? No  LIVING ENVIRONMENT: Has steps she can walk but she doesn't do them much OCCUPATION:  Retired  Risk manager:  The pool, Walking, reading, going ot to dinner.   PLOF: Independent Uses a walker at this time for longer distances   PATIENT GOALS:  To get the strength in her legs back   NEXT MD VISIT: Nothing scheduled.   OBJECTIVE:   DIAGNOSTIC FINDINGS:  Nothing   PATIENT SURVEYS:  FOTO 55% at eval; 53% on 9/26  COGNITION: Overall cognitive status: Within functional limits for tasks assessed     SENSATION: WFL  EDEMA:  Bilateral LE edema controlled with ted hose and diuretics  MUSCLE LENGTH: POSTURE: No Significant postural limitations  PALPATION: No tenderness to plapation   LOWER EXTREMITY ROM:  Active ROM Right eval Left eval  Hip flexion    Hip extension    Hip abduction    Hip adduction    Hip internal rotation    Hip external rotation     Knee flexion    Knee extension    Ankle dorsiflexion    Ankle plantarflexion    Ankle inversion    Ankle eversion     (Blank rows = not tested) within normal limits  LOWER EXTREMITY MMT:  MMT Right eval Left eval Right  10/4 Left 10/4  Hip flexion 15.5 14.8 19.3 17.3  Hip extension      Hip abduction 25.3 26.8 27.4 31.4  Hip adduction      Hip internal rotation      Hip external rotation      Knee flexion      Knee extension 23.9 18.5 30.1 23.7  Ankle dorsiflexion      Ankle plantarflexion      Ankle inversion      Ankle eversion       (  Blank rows = not tested) WNL  LOWER EXTREMITY SPECIAL TESTS:   FUNCTIONAL TESTS:  6 minute walk test: 643   1 min seated rest break at 425 ' HR not able to be obtained 2nd to cold fingers 5x sit to stand 13 sec; 9/23: 5xSTS: 8 seconds Base line HR 75 bpm  GAIT: Uses walker but does not put much weight on the walker    TODAY'S TREATMENT:                                                                                                                              DATE:   11/6 LF hip abdcution 60 lbs 3x12  Stair case up and down   Cable Row 3x10 15 lbs  Cable extension 3x10 15 lbs   High extension  2x15 15 lbs   Row machine 3x15 25 lbs   Leg press 3x15 5 lbs    11/1 Nustep 5 min L4  LF tricpes 3x10 20 lbs  LF row 3x12 20 lbs  Cybex leg press 3x12 80 lbs    hurdle fwd and back step/ side to side 5 hurdles 3 laps each    10/28 Nustep 5 min L4   LF knee extension 3x12 10 lbs  LF hip abdcution 55 lbs 3x12  LF row 15 lbs 3x15    Hallway walking head turns and nods 4x 100'   Changing speeds 4x 100'   Hallway side stepping 100'x2   10/25 Nu-step L4 hurdle fwd and step/ side to side 4 hurdles 3 laps each no rail use- light CGA when needed   Airex marching 2x10 Rhomberg on airex EC 30sec x2  Hurdle fwd and back step/ side to side 5 hurdles 3 laps each   Hurdle step with pace 2x20   Hallway walking  head turns and nods 4x 100'   Changing speeds 4x 100'   Step onto air-ex and off/ fwd and lateral 2x10 each leg   Air-ex heel toe rocking x20  Squats x20    10/18  Nu-step L4 hurdle fwd and step/ side to side 4 hurdles 3 laps each no rail use- light CGA when needed   Airex marching 2x10 Rhomberg on airex EC 30sec x2  Punch 2 lbs 2x15 3lb Bicpes curl 2x15 3lbs   Leg press 70 pounds 1x10 , 80lbs 3x10ea LF triceps press down 1x10; 1x8 30lbs  Row machine 3x10 20 lbs       PATIENT EDUCATION:  Education details: HEP, symptom management, progression of activity  Person educated: Patient Education method: Explanation, Demonstration, Tactile cues, Verbal cues, and Handouts Education comprehension: verbalized understanding, returned demonstration, verbal cues required, tactile cues required, and needs further education  HOME EXERCISE PROGRAM: Access Code: [email or web address] URL: [email or web address] Date: 09/14/2022 Prepared by: Lorayne Bender  Exercises - Seated Knee Extension with Resistance  - 1 x daily - 7 x weekly - 3 sets - 15 reps - Seated  Hip Abduction with Resistance  - 1 x daily - 7 x weekly - 3 sets - 15 reps - Seated Knee Lifts with Resistance  - 1 x daily - 7 x weekly - 3 sets - 10 reps  ASSESSMENT:  CLINICAL IMPRESSION:  The patient had no shortness of breath with exercises. She was able to perform a complete gym program today. She has been independently adjusting her weights. Overall she is making great progress. She was encouraged to try to get in a routine of going to her gym. She did the steps with a reciprocal pattern.  The patient continues to progress   OBJECTIVE IMPAIRMENTS: cardiopulmonary status limiting activity, difficulty walking, decreased strength, and impaired perceived functional ability.   ACTIVITY LIMITATIONS: standing, stairs, and locomotion level  PARTICIPATION LIMITATIONS: meal prep, cleaning, laundry, shopping, and  community activity  PERSONAL FACTORS: Age and 1 comorbidity: multiple cardiovascular issues  are also affecting patient's functional outcome.   REHAB POTENTIAL: Good  CLINICAL DECISION MAKING: Evolving/moderate complexity  EVALUATION COMPLEXITY: Moderate   GOALS: Goals reviewed with patient? Yes  SHORT TERM GOALS: Target date:12/12/2022         Patient will increase gross bilateral lower extremity strength by 5 pounds Baseline: Goal status:  progressing 10/6  2.  Patient will increase 6-minute walk test distance by 300 feet and reduce rest breaks by 1 Baseline:  Goal status:    3.  Patient will decrease time on 5 times sit to stand test by 3 seconds Baseline:  Goal status: MET  10/6  4.  Patient will be independent with base exercise program Baseline:  Goal status: Has base exercise profram 10/6    LONG TERM GOALS: Target date:  12/26/2022      Patient will walk to her meal hall without increased dyspnea Baseline:  Goal status: Ongoing SOB with short distances without walker improving as of lately 10/6   2.  Patient will go up and down steps without increase shortness of breath in order to improve community ambulation Baseline:  Goal status: Patient begging to practice stairs   3.  Patient will be able to go to the pool and back without significant fatigue Baseline:  Goal status: IN PROGRESS (remains significantly fatigued with this per pt) 9/23  4.    PLAN:  PT FREQUENCY: 1-2x/week  PT DURATION: 8 weeks  PLANNED INTERVENTIONS: Therapeutic exercises, Therapeutic activity, Neuromuscular re-education, Balance training, Gait training, Patient/Family education, Self Care, Stair training, DME instructions, Aquatic Therapy; Electrical stimulation, Cryotherapy, Moist heat, Taping, Manual therapy, and Re-evaluation.   PLAN FOR NEXT SESSION:  Consider light stair training; consider intervals on nu-step or bike; monitor HR. Patient can ambualte about 1:30 min  wouout a rest break. Work on increasing her time.  Consider gross exercise program.  Patient already has a strong home program to work on.   Dessie Coma, PT 12/15/2022, 1:14 PM

## 2022-12-17 ENCOUNTER — Encounter (HOSPITAL_BASED_OUTPATIENT_CLINIC_OR_DEPARTMENT_OTHER): Payer: Self-pay | Admitting: Cardiovascular Disease

## 2022-12-17 ENCOUNTER — Encounter (HOSPITAL_BASED_OUTPATIENT_CLINIC_OR_DEPARTMENT_OTHER): Payer: Self-pay

## 2022-12-17 ENCOUNTER — Ambulatory Visit (HOSPITAL_BASED_OUTPATIENT_CLINIC_OR_DEPARTMENT_OTHER): Payer: Medicare Other | Admitting: Physical Therapy

## 2022-12-17 NOTE — Telephone Encounter (Signed)
If shortness of breath with laying down could be sign of volume overload. Difficult to ascertain cause without an office visit.   If she would like, could take Bumex 1.5 tablets in the morning and 1 tablet in the afternoon for Saturday and Sunday. Then return to 1 tablet twice daily. Follow up as scheduled Thursday with Dr. Duke Salvia.   Alver Sorrow, NP

## 2022-12-21 ENCOUNTER — Encounter (HOSPITAL_BASED_OUTPATIENT_CLINIC_OR_DEPARTMENT_OTHER): Payer: Medicare Other | Admitting: Physical Therapy

## 2022-12-23 ENCOUNTER — Encounter (HOSPITAL_BASED_OUTPATIENT_CLINIC_OR_DEPARTMENT_OTHER): Payer: Self-pay | Admitting: Cardiovascular Disease

## 2022-12-23 ENCOUNTER — Ambulatory Visit (HOSPITAL_BASED_OUTPATIENT_CLINIC_OR_DEPARTMENT_OTHER): Payer: Medicare Other | Admitting: Cardiovascular Disease

## 2022-12-23 ENCOUNTER — Other Ambulatory Visit (HOSPITAL_BASED_OUTPATIENT_CLINIC_OR_DEPARTMENT_OTHER): Payer: Self-pay

## 2022-12-23 VITALS — BP 112/60 | HR 72 | Ht 60.5 in | Wt 117.2 lb

## 2022-12-23 DIAGNOSIS — R300 Dysuria: Secondary | ICD-10-CM | POA: Diagnosis not present

## 2022-12-23 DIAGNOSIS — Z5181 Encounter for therapeutic drug level monitoring: Secondary | ICD-10-CM

## 2022-12-23 DIAGNOSIS — I10 Essential (primary) hypertension: Secondary | ICD-10-CM

## 2022-12-23 DIAGNOSIS — I2510 Atherosclerotic heart disease of native coronary artery without angina pectoris: Secondary | ICD-10-CM

## 2022-12-23 DIAGNOSIS — I482 Chronic atrial fibrillation, unspecified: Secondary | ICD-10-CM | POA: Diagnosis not present

## 2022-12-23 MED ORDER — BUMETANIDE 2 MG PO TABS
2.0000 mg | ORAL_TABLET | Freq: Two times a day (BID) | ORAL | 3 refills | Status: DC
  Filled 2022-12-23: qty 215, 108d supply, fill #0

## 2022-12-23 NOTE — Patient Instructions (Signed)
Medication Instructions:  TAKE THE BUMEX 2 MG TWICE A DAY AND 1 MG AS NEEDED FOR SWELLING   *If you need a refill on your cardiac medications before your next appointment, please call your pharmacy*  Lab Work: URINALYSIS TODAY   CMET/LP SOON   If you have labs (blood work) drawn today and your tests are completely normal, you will receive your results only by: MyChart Message (if you have MyChart) OR A paper copy in the mail If you have any lab test that is abnormal or we need to change your treatment, we will call you to review the results.  Testing/Procedures: NONE  Follow-Up: At [facility], you and your health needs are our priority.  As part of our continuing mission to provide you with exceptional heart care, we have created designated Provider Care Teams.  These Care Teams include your primary Cardiologist (physician) and Advanced Practice Providers (APPs -  Physician Assistants and Nurse Practitioners) who all work together to provide you with the care you need, when you need it.  We recommend signing up for the patient portal called "MyChart".  Sign up information is provided on this After Visit Summary.  MyChart is used to connect with patients for Virtual Visits (Telemedicine).  Patients are able to view lab/test results, encounter notes, upcoming appointments, etc.  Non-urgent messages can be sent to your provider as well.   To learn more about what you can do with MyChart, go to [email or web address].    Your next appointment:   3 month(s)  Provider:   Chilton Si, MD or [name], NP

## 2022-12-23 NOTE — Progress Notes (Signed)
Cardiology Office Note:  .   Date:  01/16/2023  ID:  Kathryn Gibson, DOB 09/16/1933, MRN [id number] PCP: Chilton Greathouse, MD  [facility] Providers Cardiologist:  Chilton Si, MD Electrophysiologist:  Maurice Small, MD    History of Present Illness: .   Kathryn Gibson is a 89 y.o. female  female with a hx of atrial fibrillation s/p ablation, SSS s/p PPM, severe tricuspid regurgitation, moderate mitral regurgitation, aortic atherosclerosis, asymptomatic coronary calcifications, recurrent epistaxis, and hypertension, who presents for follow-up.  Ms. Kathryn Gibson was previously a patient of Dr. Patty Sermons.  She underwent ablation for atrial fibrillation on 07/01/15.  She had an episode of atrial fibrillation 12/2015.  Amiodarone was previously increased due to atrial tachycardia.  This was discontinued due to elevated LFTs which subsequently normalized.  She saw Dr. Jacques Navy 07/2018 and reported exertional dyspnea.  Dr. Jacques Navy offered stress testing which she wanted to think about.  Her device was interrogated 01/08/19 and was nearing ERI.  There were no episodes of atrial fibrillation noted.   Her generator was changed out 08/2019.  Afterward she reported feeling poorly and felt like her heart was racing, though is in sinus rhythm in the 90s.  Metoprolol was increased.  She saw Dr. Johney Frame on 05/2020 and was doing well.  Her A. fib burden was at 3.4%.  On her remote check 08/2020 it was 10.6%. She followed up with EP on 11/2020 and they did not make any changes. She had some chest tightness that was thought to be due to her thoracic spine fracture. She had a nuclear stress test 09/2020 that was normal.   She reported some LE edema. BNP was 310. Lasix was increased to 40 mg. She followed up with EP 05/2021 and was doing well with minimal Afib. She was previously on 40 mg of Lasix every day and felt better.  However she had mild renal dysfunction.  We changed her Lasix to 40 mg on Monday, Wednesday,  Friday, and continued with 20 mg on the other days. At her follow-up 12/2021 with EP she was doing well.   At her appointment 04/2022 she was recovering from COVID-19.  She was seen 06/2022 after multiple MyChart messages reporting shortness of breath and edema.  She took increasing doses of torsemide with symptomatic improvement.  She saw Kathryn Shields, NP 06/2022 and was doing better.  She is taking torsemide 20 mg with an additional 20 mg as needed.  [name] recommended that she start [place].  BNP has been consistently in the 500s despite titrating torsemide.  GFR has been between 41 and 52.  Echo 08/2022 revealed increasing tricuspid regurgitation, which was now severe.  LVEF was 55-60% with grade 2 diastolic dysfunction.  She had moderate mitral regurgitation.  She was also noted to have aortic atherosclerosis.   At her visit 10/27/22 she was feeling poorly and volume overloaded.  Bumex was increased to bid and symptoms improved at follow up the following week. She called the office 12/2022 with increasing shortness of breath and was instructed to take 1.5 torsemide tablets.  [name] presents with concerns about decreased urine output despite being on a diuretic. She reports a recent weight gain of three pounds after a meal out, which she attributes to dietary indiscretion. She also mentions a recent episode of shortness of breath while lying down, which improved with an increase in her diuretic dosage. The patient denies any current shortness of breath and reports being able to walk a  good distance without becoming winded. She also notes some swelling in her legs, particularly in a leg previously injured in a football accident.  She has had intermittent episodes of atrial fibrillation, which she reports as self-resolving and thus largely ignored. She denies any associated lightheadedness or dizziness. She also denies any burning sensation during urination, but expresses uncertainty about this  symptom.  Ms. Kathryn Gibson also reports a recent bout of insomnia, lasting a week, during which she slept very little. She has since started on Ambien, which has improved her sleep. She expresses concern about the potential impact of this medication on her kidney function. She also mentions a recent increase in cravings for sweets, which she attributes to her medication, Farxiga.  She is scheduled to see the heart failure clinic in the near future and expresses some anxiety about potential dialysis, despite reassurances about her kidney function. She also expresses a desire to continue living a full life, including travel, despite her health conditions.      ROS:  As per HP  Studies Reviewed: .       Echo 08/2022:   1. Compared with the echo 04/2020, tricuspid regurgitation has increased  from mild/moderate to severe.   2. Left ventricular ejection fraction, by estimation, is 55 to 60%. The  left ventricle has normal function. The left ventricle has no regional  wall motion abnormalities. There is mild concentric left ventricular  hypertrophy. Left ventricular diastolic  parameters are consistent with Grade II diastolic dysfunction  (pseudonormalization).   3. Right ventricular systolic function is normal. The right ventricular  size is normal. There is moderately elevated pulmonary artery systolic  pressure.   4. Left atrial size was severely dilated.   5. Right atrial size was severely dilated.   6. Mitral valve regurgitant volume 45 mL. The mitral valve is normal in  structure. Moderate mitral valve regurgitation. No evidence of mitral  stenosis.   7. Tricuspid valve regurgitation is severe.   8. The aortic valve is tricuspid. Aortic valve regurgitation is moderate.  No aortic stenosis is present. Aortic regurgitation PHT measures 351 msec.   9. There is mild (Grade II) atheroma plaque involving the ascending  aorta.  10. The inferior vena cava is dilated in size with >50% respiratory   variability, suggesting right atrial pressure of 8 mmHg.   Risk Assessment/Calculations:    CHA2DS2-VASc Score = 5   This indicates a 7.2% annual risk of stroke. The patient's score is based upon: CHF History: 1 HTN History: 1 Diabetes History: 0 Stroke History: 0 Vascular Disease History: 0 Age Score: 2 Gender Score: 1        Physical Exam:   VS:  BP 112/60   Pulse 72   Ht 5' 0.5" (1.537 m)   Wt 117 lb 3.2 oz (53.2 kg)   SpO2 91%   BMI 22.51 kg/m    Wt Readings from Last 3 Encounters:  01/04/23 115 lb 12.8 oz (52.5 kg)  12/29/22 116 lb 3.2 oz (52.7 kg)  12/23/22 117 lb 3.2 oz (53.2 kg)    GEN: Well nourished, well developed in no acute distress NECK: No JVD; No carotid bruits CARDIAC: RRR, III/VI holosystolic murmurs, rubs, gallops RESPIRATORY:  Clear to auscultation without rales, wheezing or rhonchi  ABDOMEN: Soft, non-tender, non-distended EXTREMITIES:  No edema; No deformity   ASSESSMENT AND PLAN: .    # HFpEF:  Patient reports decreased urination despite being on a diuretic and a recent weight gain of 3  pounds. No current shortness of breath or significant leg swelling. Patient has been managing symptoms with increased diuretic dose as needed. -Order comprehensive metabolic panel and urinalysis to assess kidney function. -Advise patient to continue monitoring weight and symptoms, and to take an extra half tablet of diuretic as needed when symptoms or weight increase.  # Paroxysmal Atrial Fibrillation Patient reports possible brief episodes of Afib, but these resolved spontaneously and were not associated with any significant symptoms. -Continue current management and monitoring.  # Insomnia Patient reports improved sleep with 10mg  of Ambien. -Continue Ambien 10mg  as needed for sleep.  # Hyperlipidemia Due for cholesterol check. -Order fasting lipid panel.  Follow-up in 3 months. Patient also has an appointment with the heart failure clinic in the  interim.        Signed, Chilton Si, MD

## 2022-12-24 ENCOUNTER — Encounter (HOSPITAL_BASED_OUTPATIENT_CLINIC_OR_DEPARTMENT_OTHER): Payer: Self-pay | Admitting: Physical Therapy

## 2022-12-24 ENCOUNTER — Other Ambulatory Visit (HOSPITAL_BASED_OUTPATIENT_CLINIC_OR_DEPARTMENT_OTHER): Payer: Self-pay

## 2022-12-24 ENCOUNTER — Encounter (HOSPITAL_BASED_OUTPATIENT_CLINIC_OR_DEPARTMENT_OTHER): Payer: Self-pay | Admitting: Cardiovascular Disease

## 2022-12-24 ENCOUNTER — Ambulatory Visit (HOSPITAL_BASED_OUTPATIENT_CLINIC_OR_DEPARTMENT_OTHER): Payer: Medicare Other | Admitting: Physical Therapy

## 2022-12-24 DIAGNOSIS — I10 Essential (primary) hypertension: Secondary | ICD-10-CM | POA: Diagnosis not present

## 2022-12-24 DIAGNOSIS — Z5181 Encounter for therapeutic drug level monitoring: Secondary | ICD-10-CM | POA: Diagnosis not present

## 2022-12-24 DIAGNOSIS — I2510 Atherosclerotic heart disease of native coronary artery without angina pectoris: Secondary | ICD-10-CM | POA: Diagnosis not present

## 2022-12-24 DIAGNOSIS — M6281 Muscle weakness (generalized): Secondary | ICD-10-CM

## 2022-12-24 DIAGNOSIS — R2689 Other abnormalities of gait and mobility: Secondary | ICD-10-CM | POA: Diagnosis not present

## 2022-12-24 LAB — URINALYSIS
Bilirubin, UA: NEGATIVE
Ketones, UA: NEGATIVE
Nitrite, UA: NEGATIVE
Protein,UA: NEGATIVE
RBC, UA: NEGATIVE
Specific Gravity, UA: 1.01 (ref 1.005–1.030)
Urobilinogen, Ur: 0.2 mg/dL (ref 0.2–1.0)
pH, UA: 7 (ref 5.0–7.5)

## 2022-12-24 NOTE — Therapy (Signed)
OUTPATIENT PHYSICAL THERAPY LOWER EXTREMITY TREATMENT   Patient Name: Kathryn Gibson MRN: [id number] DOB:[birth year], 89 y.o., female Today's Date: 12/24/2022  END OF SESSION:  PT End of Session - 12/24/22 1351     Visit Number 21    Number of Visits 25    Date for PT Re-Evaluation 12/26/22    Authorization Type Progress note at 23    PT Start Time 1345    PT Stop Time 1428    PT Time Calculation (min) 43 min    Activity Tolerance Patient tolerated treatment well    Behavior During Therapy [facility] for tasks assessed/performed                   Progress Note Reporting Period 08/06 to 10/4  See note below for Objective Data and Assessment of Progress/Goals.          Past Medical History:  Diagnosis Date   Arthritis    Atrial fibrillation (HCC)    Atypical chest pain 09/22/2020   Blood transfusion without reported diagnosis    2017   Bradycardia    s/p PPM   CAD in native artery 03/26/2021   Cataract    Chronic diastolic heart failure (HCC) 03/26/2021   Colonic polyp    adenomatous   Constipation    Diverticulosis    Hemorrhoids    Hyperlipidemia    Hypertension    Hypothyroidism    Irritable bowel syndrome (IBS)    Lower extremity edema 09/22/2020   Osteoporosis    osteopenia   Pacemaker 2010   UTI (urinary tract infection)    Past Surgical History:  Procedure Laterality Date   BREAST CYST ASPIRATION Left 1980s   COLONOSCOPY     ELECTROPHYSIOLOGIC STUDY N/A 07/01/2015   Procedure: Atrial Fibrillation Ablation;  Surgeon: Hillis Range, MD;  Location: [facility] INVASIVE CV LAB;  Service: Cardiovascular;  Laterality: N/A;   EYE SURGERY     heart ablation     for atrial fib   HEMATOMA EVACUATION Right 07/15/2014   Procedure: EVACUATION Right Groin Hematoma;  Surgeon: Larina Earthly, MD;  Location: [facility] OR;  Service: Vascular;  Laterality: Right;   NASAL ENDOSCOPY WITH EPISTAXIS CONTROL N/A 07/14/2014   Procedure: NASAL ENDOSCOPY WITH EPISTAXIS CONTROL;  Surgeon:  Melvenia Beam, MD;  Location[facility] OR;  Service: ENT;  Laterality: N/A;   NASAL ENDOSCOPY WITH EPISTAXIS CONTROL Bilateral 07/15/2014   Procedure: NASAL ENDOSCOPY WITH EPISTAXIS CONTROL;  Surgeon: Melvenia Beam, MD;  Location: [facility] OR;  Service: ENT;  Laterality: Bilateral;   PACEMAKER INSERTION  2010   implanted by Dr Deborah Chalk (MDT)   PPM GENERATOR CHANGEOUT N/A 08/16/2019   Procedure: PPM GENERATOR CHANGEOUT;  Surgeon: Hillis Range, MD;  Location: [facility] INVASIVE CV LAB;  Service: Cardiovascular;  Laterality: N/A;   RADIOLOGY WITH ANESTHESIA N/A 07/14/2014   Procedure: RADIOLOGY WITH ANESTHESIA;  Surgeon: Medication Radiologist, MD;  Location: [facility] OR;  Service: Radiology;  Laterality: N/A;   TEE WITHOUT CARDIOVERSION N/A 07/01/2015   Procedure: TRANSESOPHAGEAL ECHOCARDIOGRAM (TEE);  Surgeon: Quintella Reichert, MD;  Location[facility] ENDOSCOPY;  Service: Cardiovascular;  Laterality: N/A;   Patient Active Problem List   Diagnosis Date Noted   Chronic diastolic heart failure (HCC) 03/26/2021   CAD in native artery 03/26/2021   Atypical chest pain 09/22/2020   Lower extremity edema 09/22/2020   Genital herpes simplex 09/18/2019   Fatigue 06/08/2018   [place] 12/14/2016   Rectal pain 11/12/2016   Ptosis of eyelid 04/05/2016  Hypokalemia    Acute blood loss anemia    Hypomagnesemia    Chronic atrial fibrillation (HCC)    Essential hypertension    Other specified hypothyroidism    Posterior epistaxis    Hematoma complicating a procedure    Diastolic dysfunction 07/14/2014   Epistaxis, recurrent 07/14/2014   Sick sinus syndrome (HCC) 04/29/2014   Encounter for therapeutic drug monitoring 04/04/2013   Sciatica of left side 11/22/2011   Aortic valve insufficiency 03/25/2011   Dyspnea 11/24/2010   Essential hypertension, benign 04/18/2010   Hypothyroidism 04/15/2010   PALPITATIONS 04/15/2010   BRADYCARDIA-TACHYCARDIA SYNDROME 12/11/2008   IRRITABLE BOWEL SYNDROME 12/11/2008   History of colonic polyps  12/11/2008   Constipation 11/13/2008   ABDOMINAL BLOATING 11/13/2008   COAGULOPATHY 07/03/2008   Constipation 07/03/2008   CHANGE IN BOWELS 07/03/2008   Disorder of thyroid 07/02/2008   Diverticulosis of colon 07/02/2008    PCP: Dr Chilton Greathouse   REFERRING PROVIDER: [name] NP  REFERRING DIAG:   Diagnosis  R53.81 (ICD-10-CM) - Physical deconditioning    THERAPY DIAG:  Other abnormalities of gait and mobility  Muscle weakness (generalized)  Rationale for Evaluation and Treatment: Rehabilitation  ONSET DATE: Around the pandemic she began having shortness of breath   SUBJECTIVE:   SUBJECTIVE STATEMENT: The patient reports she was able to walk better from her apartment down to her dinning hall.    PERTINENT HISTORY: A-fib, Chest pain 2022, lower extremity swelling, Pacemaker; Osteopenia,  PAIN:  Are you having pain?  General arthritis that comes and goes.   PRECAUTIONS: Pacemaker   RED FLAGS: None   WEIGHT BEARING RESTRICTIONS: No  FALLS:  Has patient fallen in last 6 months? No  LIVING ENVIRONMENT: Has steps she can walk but she doesn't do them much OCCUPATION:  Retired  Risk manager:  The pool, Walking, reading, going ot to dinner.   PLOF: Independent Uses a walker at this time for longer distances   PATIENT GOALS:  To get the strength in her legs back   NEXT MD VISIT: Nothing scheduled.   OBJECTIVE:   DIAGNOSTIC FINDINGS:  Nothing   PATIENT SURVEYS:  FOTO 55% at eval; 53% on 9/26  COGNITION: Overall cognitive status: Within functional limits for tasks assessed     SENSATION: WFL  EDEMA:  Bilateral LE edema controlled with ted hose and diuretics  MUSCLE LENGTH: POSTURE: No Significant postural limitations  PALPATION: No tenderness to plapation   LOWER EXTREMITY ROM:  Active ROM Right eval Left eval  Hip flexion    Hip extension    Hip abduction    Hip adduction    Hip internal rotation    Hip external rotation     Knee flexion    Knee extension    Ankle dorsiflexion    Ankle plantarflexion    Ankle inversion    Ankle eversion     (Blank rows = not tested) within normal limits  LOWER EXTREMITY MMT:  MMT Right eval Left eval Right  10/4 Left 10/4  Hip flexion 15.5 14.8 19.3 17.3  Hip extension      Hip abduction 25.3 26.8 27.4 31.4  Hip adduction      Hip internal rotation      Hip external rotation      Knee flexion      Knee extension 23.9 18.5 30.1 23.7  Ankle dorsiflexion      Ankle plantarflexion      Ankle inversion      Ankle eversion       (  Blank rows = not tested) WNL  LOWER EXTREMITY SPECIAL TESTS:   FUNCTIONAL TESTS:  6 minute walk test: 643   1 min seated rest break at 425 ' HR not able to be obtained 2nd to cold fingers 5x sit to stand 13 sec; 9/23: 5xSTS: 8 seconds Base line HR 75 bpm  GAIT: Uses walker but does not put much weight on the walker    TODAY'S TREATMENT:                                                                                                                              DATE:   11/15 LF hip abdcution 55 lbs 3x12  LF knee extension  2x15 15 lbs  LF triceps press down 2x15 20 lbs  LF row 2x15 20 lbs   Cybex leg press 3x15 70 lbs   Air-ex narrow base eyes closed 2x30 sec hold  Tandem stance 2x30 sec hold each leg       11/6 LF hip abdcution 60 lbs 3x12  Stair case up and down   Cable Row 3x10 15 lbs  Cable extension 3x10 15 lbs   High extension  2x15 15 lbs   Row machine 3x15 25 lbs   Leg press 3x15 5 lbs    11/1 Nustep 5 min L4  LF tricpes 3x10 20 lbs  LF row 3x12 20 lbs  Cybex leg press 3x12 80 lbs    hurdle fwd and back step/ side to side 5 hurdles 3 laps each    10/28 Nustep 5 min L4   LF knee extension 3x12 10 lbs  LF hip abdcution 55 lbs 3x12  LF row 15 lbs 3x15    Hallway walking head turns and nods 4x 100'   Changing speeds 4x 100'   Hallway side stepping 100'x2   10/25 Nu-step L4 hurdle  fwd and step/ side to side 4 hurdles 3 laps each no rail use- light CGA when needed   Airex marching 2x10 [place] on airex EC 30sec x2  Hurdle fwd and back step/ side to side 5 hurdles 3 laps each   Hurdle step with pace 2x20   Hallway walking head turns and nods 4x 100'   Changing speeds 4x 100'   Step onto air-ex and off/ fwd and lateral 2x10 each leg   Air-ex heel toe rocking x20  Squats x20    10/18  Nu-step L4 hurdle fwd and step/ side to side 4 hurdles 3 laps each no rail use- light CGA when needed   Airex marching 2x10 [place] on airex EC 30sec x2  Punch 2 lbs 2x15 3lb Bicpes curl 2x15 3lbs   Leg press 70 pounds 1x10 , 80lbs 3x10ea LF triceps press down 1x10; 1x8 30lbs  Row machine 3x10 20 lbs       PATIENT EDUCATION:  Education details: HEP, symptom management, progression of activity  Person educated: Patient Education method: Explanation, Demonstration, Tactile  cues, Verbal cues, and Handouts Education comprehension: verbalized understanding, returned demonstration, verbal cues required, tactile cues required, and needs further education  HOME EXERCISE PROGRAM: Access Code: [email or web address] URL: [email or web address] Date: 09/14/2022 Prepared by: Lorayne Bender  Exercises - Seated Knee Extension with Resistance  - 1 x daily - 7 x weekly - 3 sets - 15 reps - Seated Hip Abduction with Resistance  - 1 x daily - 7 x weekly - 3 sets - 15 reps - Seated Knee Lifts with Resistance  - 1 x daily - 7 x weekly - 3 sets - 10 reps  ASSESSMENT:  CLINICAL IMPRESSION:  Patient tolerated treatment well.  She came in with mild baseline soreness and fatigue.  Despite this surgery was able to work on gym exercises as well as balance exercises.  She did well with balance exercises.  Spoke with her about purchasing an Airex for balance work at home.  Therapy will continue to progress as tolerated.  Over the next 2 visits we will finalize her HEP would likely  discharge to HEP.  OBJECTIVE IMPAIRMENTS: cardiopulmonary status limiting activity, difficulty walking, decreased strength, and impaired perceived functional ability.   ACTIVITY LIMITATIONS: standing, stairs, and locomotion level  PARTICIPATION LIMITATIONS: meal prep, cleaning, laundry, shopping, and community activity  PERSONAL FACTORS: Age and 1 comorbidity: multiple cardiovascular issues  are also affecting patient's functional outcome.   REHAB POTENTIAL: Good  CLINICAL DECISION MAKING: Evolving/moderate complexity  EVALUATION COMPLEXITY: Moderate   GOALS: Goals reviewed with patient? Yes  SHORT TERM GOALS: Target date:12/12/2022         Patient will increase gross bilateral lower extremity strength by 5 pounds Baseline: Goal status:  progressing 10/6  2.  Patient will increase 6-minute walk test distance by 300 feet and reduce rest breaks by 1 Baseline:  Goal status:    3.  Patient will decrease time on 5 times sit to stand test by 3 seconds Baseline:  Goal status: MET  10/6  4.  Patient will be independent with base exercise program Baseline:  Goal status: Has base exercise profram 10/6    LONG TERM GOALS: Target date:  12/26/2022      Patient will walk to her meal hall without increased dyspnea Baseline:  Goal status: Ongoing SOB with short distances without walker improving as of lately 10/6   2.  Patient will go up and down steps without increase shortness of breath in order to improve community ambulation Baseline:  Goal status: Patient begging to practice stairs   3.  Patient will be able to go to the pool and back without significant fatigue Baseline:  Goal status: IN PROGRESS (remains significantly fatigued with this per pt) 9/23  4.    PLAN:  PT FREQUENCY: 1-2x/week  PT DURATION: 8 weeks  PLANNED INTERVENTIONS: Therapeutic exercises, Therapeutic activity, Neuromuscular re-education, Balance training, Gait training, Patient/Family  education, Self Care, Stair training, DME instructions, Aquatic Therapy; Electrical stimulation, Cryotherapy, Moist heat, Taping, Manual therapy, and Re-evaluation.   PLAN FOR NEXT SESSION:  Consider light stair training; consider intervals on nu-step or bike; monitor HR. Patient can ambualte about 1:30 min wouout a rest break. Work on increasing her time.  Consider gross exercise program.  Patient already has a strong home program to work on.   Dessie Coma, PT 12/24/2022, 1:58 PM

## 2022-12-25 LAB — COMPREHENSIVE METABOLIC PANEL
ALT: 29 [IU]/L (ref 0–32)
AST: 43 [IU]/L — ABNORMAL HIGH (ref 0–40)
Albumin: 4.3 g/dL (ref 3.7–4.7)
Alkaline Phosphatase: 123 [IU]/L — ABNORMAL HIGH (ref 44–121)
BUN/Creatinine Ratio: 24 (ref 12–28)
BUN: 26 mg/dL (ref 8–27)
Bilirubin Total: 0.8 mg/dL (ref 0.0–1.2)
CO2: 27 mmol/L (ref 20–29)
Calcium: 9.3 mg/dL (ref 8.7–10.3)
Chloride: 98 mmol/L (ref 96–106)
Creatinine, Ser: 1.08 mg/dL — ABNORMAL HIGH (ref 0.57–1.00)
Globulin, Total: 2.8 g/dL (ref 1.5–4.5)
Glucose: 97 mg/dL (ref 70–99)
Potassium: 3.8 mmol/L (ref 3.5–5.2)
Sodium: 142 mmol/L (ref 134–144)
Total Protein: 7.1 g/dL (ref 6.0–8.5)
eGFR: 50 mL/min/{1.73_m2} — ABNORMAL LOW (ref >59)

## 2022-12-25 LAB — LIPID PANEL
Chol/HDL Ratio: 2.3 ratio (ref 0.0–4.4)
Cholesterol, Total: 122 mg/dL (ref 100–199)
HDL: 54 mg/dL (ref >39)
LDL Chol Calc (NIH): 56 mg/dL (ref 0–99)
Triglycerides: 55 mg/dL (ref 0–149)
VLDL Cholesterol Cal: 12 mg/dL (ref 5–40)

## 2022-12-28 ENCOUNTER — Ambulatory Visit (HOSPITAL_BASED_OUTPATIENT_CLINIC_OR_DEPARTMENT_OTHER): Payer: Medicare Other | Admitting: Physical Therapy

## 2022-12-28 DIAGNOSIS — M6281 Muscle weakness (generalized): Secondary | ICD-10-CM

## 2022-12-28 DIAGNOSIS — R2689 Other abnormalities of gait and mobility: Secondary | ICD-10-CM

## 2022-12-28 NOTE — Therapy (Signed)
OUTPATIENT PHYSICAL THERAPY LOWER EXTREMITY TREATMENT/progress note    Patient Name: Kathryn Gibson MRN: [id number] DOB:[birth year], 89 y.o., female Today's Date: 12/28/2022  END OF SESSION:  PT End of Session - 12/28/22 1321     Visit Number 22    Number of Visits 25    Date for PT Re-Evaluation 02/08/23    PT Start Time 1300    PT Stop Time 1343    PT Time Calculation (min) 43 min    Activity Tolerance Patient tolerated treatment well    Behavior During Therapy [facility] for tasks assessed/performed                    Progress Note Reporting Period `0/4 to 11/19  See note below for Objective Data and Assessment of Progress/Goals.          Past Medical History:  Diagnosis Date   Arthritis    Atrial fibrillation (HCC)    Atypical chest pain 09/22/2020   Blood transfusion without reported diagnosis    2017   Bradycardia    s/p PPM   CAD in native artery 03/26/2021   Cataract    Chronic diastolic heart failure (HCC) 03/26/2021   Colonic polyp    adenomatous   Constipation    Diverticulosis    Hemorrhoids    Hyperlipidemia    Hypertension    Hypothyroidism    Irritable bowel syndrome (IBS)    Lower extremity edema 09/22/2020   Osteoporosis    osteopenia   Pacemaker 2010   UTI (urinary tract infection)    Past Surgical History:  Procedure Laterality Date   BREAST CYST ASPIRATION Left 1980s   COLONOSCOPY     ELECTROPHYSIOLOGIC STUDY N/A 07/01/2015   Procedure: Atrial Fibrillation Ablation;  Surgeon: Hillis Range, MD;  Location: [facility] INVASIVE CV LAB;  Service: Cardiovascular;  Laterality: N/A;   EYE SURGERY     heart ablation     for atrial fib   HEMATOMA EVACUATION Right 07/15/2014   Procedure: EVACUATION Right Groin Hematoma;  Surgeon: Larina Earthly, MD;  Location: [facility] OR;  Service: Vascular;  Laterality: Right;   NASAL ENDOSCOPY WITH EPISTAXIS CONTROL N/A 07/14/2014   Procedure: NASAL ENDOSCOPY WITH EPISTAXIS CONTROL;  Surgeon: Melvenia Beam, MD;   Location: [facility] OR;  Service: ENT;  Laterality: N/A;   NASAL ENDOSCOPY WITH EPISTAXIS CONTROL Bilateral 07/15/2014   Procedure: NASAL ENDOSCOPY WITH EPISTAXIS CONTROL;  Surgeon: Melvenia Beam, MD;  Location: [facility] OR;  Service: ENT;  Laterality: Bilateral;   PACEMAKER INSERTION  2010   implanted by Dr Deborah Chalk (MDT)   PPM GENERATOR CHANGEOUT N/A 08/16/2019   Procedure: PPM GENERATOR CHANGEOUT;  Surgeon: Hillis Range, MD;  Location: [facility] INVASIVE CV LAB;  Service: Cardiovascular;  Laterality: N/A;   RADIOLOGY WITH ANESTHESIA N/A 07/14/2014   Procedure: RADIOLOGY WITH ANESTHESIA;  Surgeon: Medication Radiologist, MD;  Location: [facility] OR;  Service: Radiology;  Laterality: N/A;   TEE WITHOUT CARDIOVERSION N/A 07/01/2015   Procedure: TRANSESOPHAGEAL ECHOCARDIOGRAM (TEE);  Surgeon: Quintella Reichert, MD;  Location: [facility] ENDOSCOPY;  Service: Cardiovascular;  Laterality: N/A;   Patient Active Problem List   Diagnosis Date Noted   Chronic diastolic heart failure (HCC) 03/26/2021   CAD in native artery 03/26/2021   Atypical chest pain 09/22/2020   Lower extremity edema 09/22/2020   Genital herpes simplex 09/18/2019   Fatigue 06/08/2018   [place] 12/14/2016   Rectal pain 11/12/2016   Ptosis of eyelid 04/05/2016   Hypokalemia    Acute  blood loss anemia    Hypomagnesemia    Chronic atrial fibrillation (HCC)    Essential hypertension    Other specified hypothyroidism    Posterior epistaxis    Hematoma complicating a procedure    Diastolic dysfunction 07/14/2014   Epistaxis, recurrent 07/14/2014   Sick sinus syndrome (HCC) 04/29/2014   Encounter for therapeutic drug monitoring 04/04/2013   Sciatica of left side 11/22/2011   Aortic valve insufficiency 03/25/2011   Dyspnea 11/24/2010   Essential hypertension, benign 04/18/2010   Hypothyroidism 04/15/2010   PALPITATIONS 04/15/2010   BRADYCARDIA-TACHYCARDIA SYNDROME 12/11/2008   IRRITABLE BOWEL SYNDROME 12/11/2008   History of colonic polyps 12/11/2008    Constipation 11/13/2008   ABDOMINAL BLOATING 11/13/2008   COAGULOPATHY 07/03/2008   Constipation 07/03/2008   CHANGE IN BOWELS 07/03/2008   Disorder of thyroid 07/02/2008   Diverticulosis of colon 07/02/2008    PCP: Dr Chilton Greathouse   REFERRING PROVIDER: [name] NP  REFERRING DIAG:   Diagnosis  R53.81 (ICD-10-CM) - Physical deconditioning    THERAPY DIAG:  Other abnormalities of gait and mobility  Muscle weakness (generalized)  Rationale for Evaluation and Treatment: Rehabilitation  ONSET DATE: Around the pandemic she began having shortness of breath   SUBJECTIVE:   SUBJECTIVE STATEMENT: The patient reports that her breathing has been doing well.    PERTINENT HISTORY: A-fib, Chest pain 2022, lower extremity swelling, Pacemaker; Osteopenia,  PAIN:  Are you having pain?  General arthritis that comes and goes.   PRECAUTIONS: Pacemaker   RED FLAGS: None   WEIGHT BEARING RESTRICTIONS: No  FALLS:  Has patient fallen in last 6 months? No  LIVING ENVIRONMENT: Has steps she can walk but she doesn't do them much OCCUPATION:  Retired  Risk manager:  The pool, Walking, reading, going ot to dinner.   PLOF: Independent Uses a walker at this time for longer distances   PATIENT GOALS:  To get the strength in her legs back   NEXT MD VISIT: Nothing scheduled.   OBJECTIVE:   DIAGNOSTIC FINDINGS:  Nothing   PATIENT SURVEYS:  FOTO 55% at eval; 53% on 9/26  COGNITION: Overall cognitive status: Within functional limits for tasks assessed     SENSATION: WFL  EDEMA:  Bilateral LE edema controlled with ted hose and diuretics  MUSCLE LENGTH: POSTURE: No Significant postural limitations  PALPATION: No tenderness to plapation   LOWER EXTREMITY ROM:  Active ROM Right eval Left eval  Hip flexion    Hip extension    Hip abduction    Hip adduction    Hip internal rotation    Hip external rotation    Knee flexion    Knee extension    Ankle  dorsiflexion    Ankle plantarflexion    Ankle inversion    Ankle eversion     (Blank rows = not tested) within normal limits  LOWER EXTREMITY MMT:  MMT Right eval Left eval Right  10/4 Left 10/4 right left  Hip flexion 15.5 14.8 19.3 17.3 24.0 22.8  Hip extension        Hip abduction 25.3 26.8 27.4 31.4 27.7 31.1  Hip adduction        Hip internal rotation        Hip external rotation        Knee flexion        Knee extension 23.9 18.5 30.1 23.7 24.1 22.9  Ankle dorsiflexion        Ankle plantarflexion  Ankle inversion        Ankle eversion         (Blank rows = not tested) WNL  LOWER EXTREMITY SPECIAL TESTS:   FUNCTIONAL TESTS:  6 minute walk test: 643   1 min seated rest break at 425 ' HR not able to be obtained 2nd to cold fingers 5x sit to stand 13 sec; 9/23: 5xSTS: 8 seconds    Base line HR 75 bpm  GAIT: Uses walker but does not put much weight on the walker    TODAY'S TREATMENT:                                                                                                                              DATE:   11/19 LF hip abdcution 60 lbs 3x12  LF knee extension  2x15 15 lbs  LF triceps press down 2x15 25 lbs  LF row 2x15 20 lbs   Cybex leg press 2x15 75  lbs 1x15 80 lbs   Cable rows RPE 3 x12 lbs   Tandem stance 2x30 sec hold   11/15 LF hip abdcution 55 lbs 3x12  LF knee extension  2x15 15 lbs  LF triceps press down 2x15 20 lbs  LF row 2x15 20 lbs   Cybex leg press 3x15 70 lbs   Air-ex narrow base eyes closed 2x30 sec hold  Tandem stance 2x30 sec hold each leg       11/6 LF hip abdcution 60 lbs 3x12  Stair case up and down   Cable Row 3x10 15 lbs  Cable extension 3x10 15 lbs   High extension  2x15 15 lbs   Row machine 3x15 25 lbs   Leg press 3x15 5 lbs    11/1 Nustep 5 min L4  LF tricpes 3x10 20 lbs  LF row 3x12 20 lbs  Cybex leg press 3x12 80 lbs    hurdle fwd and back step/ side to side 5 hurdles 3 laps each     10/28 Nustep 5 min L4   LF knee extension 3x12 10 lbs  LF hip abdcution 55 lbs 3x12  LF row 15 lbs 3x15    Hallway walking head turns and nods 4x 100'   Changing speeds 4x 100'   Hallway side stepping 100'x2   10/25 Nu-step L4 hurdle fwd and step/ side to side 4 hurdles 3 laps each no rail use- light CGA when needed   Airex marching 2x10 Rhomberg on airex EC 30sec x2  Hurdle fwd and back step/ side to side 5 hurdles 3 laps each   Hurdle step with pace 2x20   Hallway walking head turns and nods 4x 100'   Changing speeds 4x 100'   Step onto air-ex and off/ fwd and lateral 2x10 each leg   Air-ex heel toe rocking x20  Squats x20    10/18  Nu-step L4 hurdle fwd and step/ side to side 4 hurdles  3 laps each no rail use- light CGA when needed   Airex marching 2x10 Rhomberg on airex EC 30sec x2  Punch 2 lbs 2x15 3lb Bicpes curl 2x15 3lbs   Leg press 70 pounds 1x10 , 80lbs 3x10ea LF triceps press down 1x10; 1x8 30lbs  Row machine 3x10 20 lbs       PATIENT EDUCATION:  Education details: HEP, symptom management, progression of activity  Person educated: Patient Education method: Explanation, Demonstration, Tactile cues, Verbal cues, and Handouts Education comprehension: verbalized understanding, returned demonstration, verbal cues required, tactile cues required, and needs further education  HOME EXERCISE PROGRAM: Access Code: [email or web address] URL: [email or web address] Date: 09/14/2022 Prepared by: Lorayne Bender  Exercises - Seated Knee Extension with Resistance  - 1 x daily - 7 x weekly - 3 sets - 15 reps - Seated Hip Abduction with Resistance  - 1 x daily - 7 x weekly - 3 sets - 15 reps - Seated Knee Lifts with Resistance  - 1 x daily - 7 x weekly - 3 sets - 10 reps  ASSESSMENT:  CLINICAL IMPRESSION:  The patient continues to make good progress. We advanced her resistance with several exercises. She had no increase in dyspnea.  We also focused on balance exercises. Therapy reviewed FOTO and strength measurements. Her hip flexors have improved. Her FOTO score has also improved. She is near her gaol. We will continue 1 more visit to finalize her HEP. She plans on working on her HEP at her gym at [employer].   OBJECTIVE IMPAIRMENTS: cardiopulmonary status limiting activity, difficulty walking, decreased strength, and impaired perceived functional ability.   ACTIVITY LIMITATIONS: standing, stairs, and locomotion level  PARTICIPATION LIMITATIONS: meal prep, cleaning, laundry, shopping, and community activity  PERSONAL FACTORS: Age and 1 comorbidity: multiple cardiovascular issues  are also affecting patient's functional outcome.   REHAB POTENTIAL: Good  CLINICAL DECISION MAKING: Evolving/moderate complexity  EVALUATION COMPLEXITY: Moderate   GOALS: Goals reviewed with patient? Yes  SHORT TERM GOALS: Target date:12/12/2022         Patient will increase gross bilateral lower extremity strength by 5 pounds Baseline: Goal status:  progressing 11/19 improved but not met   2.  Patient will increase 6-minute walk test distance by 300 feet and reduce rest breaks by 1 Baseline:  Goal status:    3.  Patient will decrease time on 5 times sit to stand test by 3 seconds Baseline:  Goal status: MET  10/6  4.  Patient will be independent with base exercise program Baseline:  Goal status: Has base exercise profram 10/6    LONG TERM GOALS: Target date:  12/26/2022      Patient will walk to her meal hall without increased dyspnea Baseline:  Goal status: Ongoing SOB with short distances without walker improving as of lately 10/6   2.  Patient will go up and down steps without increase shortness of breath in order to improve community ambulation Baseline:  Goal status: Patient begging to practice stairs   3.  Patient will be able to go to the pool and back without significant fatigue Baseline:  Goal  status: IN PROGRESS (remains significantly fatigued with this per pt) 9/23   PLAN:  PT FREQUENCY: 1-2x/week  PT DURATION: 8 weeks  PLANNED INTERVENTIONS: Therapeutic exercises, Therapeutic activity, Neuromuscular re-education, Balance training, Gait training, Patient/Family education, Self Care, Stair training, DME instructions, Aquatic Therapy; Electrical stimulation, Cryotherapy, Moist heat, Taping, Manual therapy, and Re-evaluation.   PLAN FOR NEXT SESSION:  Consider light  stair training; consider intervals on nu-step or bike; monitor HR. Patient can ambualte about 1:30 min wouout a rest break. Work on increasing her time.  Consider gross exercise program.  Patient already has a strong home program to work on.   Dessie Coma, PT 12/28/2022, 3:07 PM

## 2022-12-29 ENCOUNTER — Encounter (HOSPITAL_COMMUNITY): Payer: Self-pay | Admitting: Cardiology

## 2022-12-29 ENCOUNTER — Other Ambulatory Visit (HOSPITAL_BASED_OUTPATIENT_CLINIC_OR_DEPARTMENT_OTHER): Payer: Self-pay

## 2022-12-29 ENCOUNTER — Ambulatory Visit (HOSPITAL_COMMUNITY)
Admission: RE | Admit: 2022-12-29 | Discharge: 2022-12-29 | Disposition: A | Discharge status: Home / Self Care | Payer: Medicare Other | Source: Ambulatory Visit | Attending: Cardiology | Admitting: Cardiology

## 2022-12-29 VITALS — BP 120/70 | HR 62 | Wt 116.2 lb

## 2022-12-29 DIAGNOSIS — I480 Paroxysmal atrial fibrillation: Secondary | ICD-10-CM | POA: Insufficient documentation

## 2022-12-29 DIAGNOSIS — I071 Rheumatic tricuspid insufficiency: Secondary | ICD-10-CM | POA: Diagnosis not present

## 2022-12-29 DIAGNOSIS — I495 Sick sinus syndrome: Secondary | ICD-10-CM | POA: Insufficient documentation

## 2022-12-29 DIAGNOSIS — Z79899 Other long term (current) drug therapy: Secondary | ICD-10-CM | POA: Diagnosis not present

## 2022-12-29 DIAGNOSIS — I361 Nonrheumatic tricuspid (valve) insufficiency: Secondary | ICD-10-CM | POA: Diagnosis not present

## 2022-12-29 DIAGNOSIS — Z8249 Family history of ischemic heart disease and other diseases of the circulatory system: Secondary | ICD-10-CM | POA: Insufficient documentation

## 2022-12-29 DIAGNOSIS — I110 Hypertensive heart disease with heart failure: Secondary | ICD-10-CM | POA: Insufficient documentation

## 2022-12-29 DIAGNOSIS — I5032 Chronic diastolic (congestive) heart failure: Secondary | ICD-10-CM | POA: Insufficient documentation

## 2022-12-29 DIAGNOSIS — Z7901 Long term (current) use of anticoagulants: Secondary | ICD-10-CM | POA: Diagnosis not present

## 2022-12-29 MED ORDER — SPIRONOLACTONE 25 MG PO TABS
12.5000 mg | ORAL_TABLET | Freq: Every day | ORAL | 3 refills | Status: DC
  Filled 2022-12-29: qty 45, 90d supply, fill #0

## 2022-12-29 MED ORDER — BUMETANIDE 2 MG PO TABS: 2.0000 mg | ORAL_TABLET | Freq: Two times a day (BID) | ORAL | Status: DC

## 2022-12-29 MED ORDER — BUMETANIDE 2 MG PO TABS
ORAL_TABLET | ORAL | 3 refills | Status: DC
  Filled 2022-12-29: qty 215, 86d supply, fill #0

## 2022-12-29 NOTE — Progress Notes (Signed)
PCP: Chilton Greathouse, MD Cardiology: Dr. Duke Salvia HF Cardiology: Dr. Shirlee Latch  89 y.o. with history of paroxysmal atrial fibrillation, sick sinus syndrome s/p Medtronic PPM, and chronic diastolic CHF was referred by Dr. Duke Salvia for evaluation of CHF.  Patient has a long history of atrial fibrillation.  She had an ablation in 2017.  She was on amiodarone for rhythm control initially, but developed elevated LFTs so this was ultimately stopped.  Since 2022, she has been on flecainide with no problems. She has sick sinus syndrome and is pacemaker dependent.  Patient had a stress Cardiolite in 8/22 that showed no ischemia/infarction.    Most recent echo in 7/24 showed EF 55-60%, mild LVH, normal RV, moderate MR, severe biatrial enlargement, moderate AI, severe TR.   For at least 6 months, she has has increased dyspnea and volume overload.  In 6/24, she began to note orthopnea and worsening dyspnea with her ADLs. She started using a walker when she walks to the dining room at [place] where she lives.  She is tired/short of breath after walking about 50 yards. She can climb a flight of stairs.  No problems with ADLs. No orthopnea/PND.  No chest pain.  No lightheadedness or falls.   Medtronic device interrogation: rare atrial fibrillation, 99% v-pacing  REDS clip 29%  ECG (personally reviewed): A-V sequential pacing with paced QTc 572 msec  Labs (11/24): K 3.8, creatinine 1.08, LDL 56  PMH: 1. Atrial fibrillation: Paroxysmal.   - AF ablation in 5/17.  - Elevated LFTs on amiodarone, stopped.  2. CAD: Coronary calcification noted on imaging.  - Cardiolite in 8/22 with no ischemia/infarction 3. Sick sinus syndrome: Medtronic PPM.  4. HTN 5. H/o atrial tachycardia 6. Hypothyroidism 7. Chronic diastolic CHF: Echo in 7/24 with EF 55-60%, mild LVH, normal RV, moderate MR, severe biatrial enlargement, moderate AI, severe TR.   SH: Widowed, lives at [facility].  Occasional ETOH.  No smoking.    Family History  Problem Relation Age of Onset   Hypertension Mother    Congestive Heart Failure Mother    Dementia Mother    Heart attack Father 74       MI cause of death   Diabetes Paternal Grandfather    CAD Brother        2 stents   Lung cancer Son    Leukemia Daughter    Colon cancer Neg Hx    Kidney disease Neg Hx    Liver disease Neg Hx    Esophageal cancer Neg Hx    Stroke Neg Hx    Rectal cancer Neg Hx    Stomach cancer Neg Hx    Current Outpatient Medications  Medication Sig Dispense Refill   acetaminophen (TYLENOL) 500 MG tablet Take 500-1,000 mg by mouth every 6 (six) hours as needed for moderate pain or headache.     Artificial Tear Ointment (DRY EYES OP) Place 1 drop into both eyes daily as needed (Dry eye).     Ascorbic Acid (VITAMIN C) 1000 MG tablet Take 1,000 mg by mouth daily.       calcium carbonate (OSCAL) 1500 (600 Ca) MG TABS tablet Take 1 tablet by mouth daily.     cholecalciferol (VITAMIN D) 1000 UNITS tablet Take 1,000 Units by mouth daily.       Coenzyme Q10 (CO Q-10 PO) Take 1 tablet by mouth daily.     cromolyn (OPTICROM) 4 % ophthalmic solution Instill 1 drop into both eyes four times a day 10 mL  11   dapagliflozin propanediol (FARXIGA) 10 MG TABS tablet Take 1 tablet (10 mg total) by mouth daily before breakfast. 30 tablet 5   diltiazem (CARDIZEM CD) 360 MG 24 hr capsule Take 1 capsule (360 mg total) by mouth daily. 90 capsule 2   diltiazem (CARDIZEM) 30 MG tablet Take 1/2 to 1 tablet every 4 hours AS NEEDED for heart rate >100 as long as top blood pressure >100. 45 tablet 3   famotidine (PEPCID) 20 MG tablet Take 20 mg by mouth daily.     flecainide (TAMBOCOR) 50 MG tablet Take 1 tablet (50 mg total) by mouth 2 (two) times daily. 60 tablet 11   levothyroxine (SYNTHROID) 50 MCG tablet Take 1 tablet in the morning on an empty stomach by mouth once a week for 90 days 36 tablet 3   levothyroxine (SYNTHROID) 75 MCG tablet TAKE 75 MCG BY MOUTH FOR 6  DAYS A WEEK (TAKE 50 MCG THE REMAINING 1 DAY OF THE WEEK). Oral 90 days 78 tablet 3   methylcellulose (CITRUCEL) oral powder Take 2 packets by mouth daily.     metoprolol succinate (TOPROL XL) 50 MG 24 hr tablet Take 1.5 tablets (75 mg total) by mouth 2 (two) times daily. 270 tablet 3   Multiple Vitamins-Minerals (ICAPS AREDS 2 PO) Take 1 capsule by mouth daily.      Omega-3 Fatty Acids (FISH OIL) 1200 MG CAPS Take 1,200 mg by mouth daily. Mega Red.     polyethylene glycol (MIRALAX / GLYCOLAX) 17 g packet Take 17 g by mouth as needed.     pravastatin (PRAVACHOL) 10 MG tablet Take 1 tablet (10 mg total) by mouth daily. 90 tablet 2   Probiotic Product (ALIGN PO) Take 1 capsule by mouth daily.     Rivaroxaban (XARELTO) 15 MG TABS tablet Take 1 tablet (15 mg total) by mouth daily with supper. 90 tablet 2   senna-docusate (SENOKOT S) 8.6-50 MG tablet Take 1 tablet by mouth as needed for mild constipation.     sodium chloride (OCEAN) 0.65 % SOLN nasal spray Place 1 spray into both nostrils daily.     spironolactone (ALDACTONE) 25 MG tablet Take 0.5 tablets (12.5 mg total) by mouth daily. 45 tablet 3   Zinc 50 MG CAPS Take 50 mg by mouth daily.      zolpidem (AMBIEN) 10 MG tablet Take 10 mg by mouth at bedtime as needed for sleep.     bumetanide (BUMEX) 2 MG tablet Take 1 tablet (2 mg total) by mouth 2 (two) times daily. Ok to use an extra 1/2 tablet as needed for swelling     No current facility-administered medications for this encounter.   BP 120/70   Pulse 62   Wt 52.7 kg (116 lb 3.2 oz)   SpO2 95%   BMI 22.32 kg/m  General: NAD Neck: JVP 8-9 cm with prominent CV waves, no thyromegaly or thyroid nodule.  Lungs: Clear to auscultation bilaterally with normal respiratory effort. CV: Nondisplaced PMI.  Heart regular S1/S2, no S3/S4, 2/6 HSM LLSB.  Trace ankle edema.  No carotid bruit.  Normal pedal pulses.  Abdomen: Soft, nontender, no hepatosplenomegaly, no distention.  Skin: Intact without  lesions or rashes.  Neurologic: Alert and oriented x 3.  Psych: Normal affect. Extremities: No clubbing or cyanosis.  HEENT: Normal.   Assessment/Plan: 1. Chronic diastolic CHF: Echo in 7/24 showed EF 55-60%, mild LVH, normal RV, moderate MR, severe biatrial enlargement, moderate AI, severe TR.  Her  CHF may be due to long-standing atriopathy with dilation of the atria and development of severe TR as well as diastolic LV dysfunction with moderate presumably pulmonary venous hypertension. She has some JVD today in the setting of severe TR though REDS clip is not elevated at 29% suggesting normal lung volume. NYHA class II-III symptoms, stable recently.   - I will keep bumetanide dose unchanged today at 2 mg bid given normal REDS clip.  - Continue Farxiga 10 mg daily.  - Add spironolactone 12.5 mg daily.  BMET/BNP 10 days.  2. Tricuspid regurgitation: Severe on echo from 7/24. Suspect atrial functional TR with severe right atrial enlargement in setting of atrial fibrillation.  - She may be a candidate for Triclip.  However, with relatively normal-appearing RV, think that it would be reasonable to continue medical management for now.  3. Atrial fibrillation: Paroxysmal.  She is in NSR today.  I am a little leery about flecainide use in a patient with structural heart disease, but she has been on it for 2 years with no problems.  - Continue flecainide 50 mg bid.  - Continue current Toprol XL and diltiazem CD.  - Continue Xarelto 15 mg daily.  4. Sick sinus syndrome: Has MDT PPM.   Followup in 6 wks.  Marca Ancona 12/30/2022

## 2022-12-29 NOTE — Progress Notes (Incomplete)
PCP: Chilton Greathouse, MD Cardiology: Dr. Duke Salvia HF Cardiology: Dr. Shirlee Latch  89 y.o.   ECG (personally reviewed): A-V sequential pacing with paced QTc 572 msec  Labs (11/24): K 3.8, creatinine 1.08, LDL 56  PMH: 1. Atrial fibrillation: Paroxysmal.   - AF ablation in 5/17.  - Elevated LFTs on amiodarone, stopped.  2. CAD: Coronary calcification noted on imaging.  - Cardiolite in  3. Sick sinus syndrome: [employer] PPM.  4. HTN 5. H/o atrial tachycardia 6. Hypothyroidism   SH: Widowed, lives at [facility].  Occasional ETOH.  No smoking.   Family History  Problem Relation Age of Onset  . Hypertension Mother   . Congestive Heart Failure Mother   . Dementia Mother   . Heart attack Father 9       MI cause of death  . Diabetes Paternal Grandfather   . CAD Brother        2 stents  . Lung cancer Son   . Leukemia Daughter   . Colon cancer Neg Hx   . Kidney disease Neg Hx   . Liver disease Neg Hx   . Esophageal cancer Neg Hx   . Stroke Neg Hx   . Rectal cancer Neg Hx   . Stomach cancer Neg Hx    Current Outpatient Medications  Medication Sig Dispense Refill  . acetaminophen (TYLENOL) 500 MG tablet Take 500-1,000 mg by mouth every 6 (six) hours as needed for moderate pain or headache.    . Artificial Tear Ointment (DRY EYES OP) Place 1 drop into both eyes daily as needed (Dry eye).    . Ascorbic Acid (VITAMIN C) 1000 MG tablet Take 1,000 mg by mouth daily.      . calcium carbonate (OSCAL) 1500 (600 Ca) MG TABS tablet Take 1 tablet by mouth daily.    . cholecalciferol (VITAMIN D) 1000 UNITS tablet Take 1,000 Units by mouth daily.      . Coenzyme Q10 (CO Q-10 PO) Take 1 tablet by mouth daily.    . cromolyn (OPTICROM) 4 % ophthalmic solution Instill 1 drop into both eyes four times a day 10 mL 11  . dapagliflozin propanediol (FARXIGA) 10 MG TABS tablet Take 1 tablet (10 mg total) by mouth daily before breakfast. 30 tablet 5  . diltiazem (CARDIZEM CD) 360 MG 24 hr capsule Take  1 capsule (360 mg total) by mouth daily. 90 capsule 2  . diltiazem (CARDIZEM) 30 MG tablet Take 1/2 to 1 tablet every 4 hours AS NEEDED for heart rate >100 as long as top blood pressure >100. 45 tablet 3  . famotidine (PEPCID) 20 MG tablet Take 20 mg by mouth daily.    . flecainide (TAMBOCOR) 50 MG tablet Take 1 tablet (50 mg total) by mouth 2 (two) times daily. 60 tablet 11  . levothyroxine (SYNTHROID) 50 MCG tablet Take 1 tablet in the morning on an empty stomach by mouth once a week for 90 days 36 tablet 3  . levothyroxine (SYNTHROID) 75 MCG tablet TAKE 75 MCG BY MOUTH FOR 6 DAYS A WEEK (TAKE 50 MCG THE REMAINING 1 DAY OF THE WEEK). Oral 90 days 78 tablet 3  . methylcellulose (CITRUCEL) oral powder Take 2 packets by mouth daily.    . metoprolol succinate (TOPROL XL) 50 MG 24 hr tablet Take 1.5 tablets (75 mg total) by mouth 2 (two) times daily. 270 tablet 3  . Multiple Vitamins-Minerals (ICAPS AREDS 2 PO) Take 1 capsule by mouth daily.     Marland Kitchen  Omega-3 Fatty Acids (FISH OIL) 1200 MG CAPS Take 1,200 mg by mouth daily. Mega Red.    . polyethylene glycol (MIRALAX / GLYCOLAX) 17 g packet Take 17 g by mouth as needed.    . pravastatin (PRAVACHOL) 10 MG tablet Take 1 tablet (10 mg total) by mouth daily. 90 tablet 2  . Probiotic Product (ALIGN PO) Take 1 capsule by mouth daily.    . Rivaroxaban (XARELTO) 15 MG TABS tablet Take 1 tablet (15 mg total) by mouth daily with supper. 90 tablet 2  . senna-docusate (SENOKOT S) 8.6-50 MG tablet Take 1 tablet by mouth as needed for mild constipation.    . sodium chloride (OCEAN) 0.65 % SOLN nasal spray Place 1 spray into both nostrils daily.    Marland Kitchen spironolactone (ALDACTONE) 25 MG tablet Take 0.5 tablets (12.5 mg total) by mouth daily. 45 tablet 3  . Zinc 50 MG CAPS Take 50 mg by mouth daily.     Marland Kitchen zolpidem (AMBIEN) 10 MG tablet Take 10 mg by mouth at bedtime as needed for sleep.    . bumetanide (BUMEX) 2 MG tablet Take 1 tablet (2 mg total) by mouth 2 (two) times  daily. Ok to use an extra 1/2 tablet as needed for swelling     No current facility-administered medications for this encounter.   BP 120/70   Pulse 62   Wt 52.7 kg (116 lb 3.2 oz)   SpO2 95%   BMI 22.32 kg/m  General: NAD Neck: JVP 8-9 cm with prominent CV waves, no thyromegaly or thyroid nodule.  Lungs: Clear to auscultation bilaterally with normal respiratory effort. CV: Nondisplaced PMI.  Heart regular S1/S2, no S3/S4, 2/6 HSM LLSB.  Trace ankle edema.  No carotid bruit.  Normal pedal pulses.  Abdomen: Soft, nontender, no hepatosplenomegaly, no distention.  Skin: Intact without lesions or rashes.  Neurologic: Alert and oriented x 3.  Psych: Normal affect. Extremities: No clubbing or cyanosis.  HEENT: Normal.   Assessment/Plan: 1.

## 2022-12-29 NOTE — Progress Notes (Addendum)
ReDS Vest / Clip - 12/29/22 1400       ReDS Vest / Clip   Station Marker A    Ruler Value 27    ReDS Value Range Low volume    ReDS Actual Value 29

## 2022-12-29 NOTE — Patient Instructions (Addendum)
  START Spironolactone 12.5 mg ( 1/2 tab) daily.  Blood work in 10 days.  Your physician recommends that you schedule a follow-up appointment in: 2 months (January 2025) ** PLEASE CALL THE OFFICE IN MID DECEMBER TO ARRANGE YOUR FOLLOW UP APPOINTMENT. **  If you have any questions or concerns before your next appointment please send [place] a message through [place] or call our office at [phone].    TO LEAVE A MESSAGE FOR THE NURSE SELECT OPTION 2, PLEASE LEAVE A MESSAGE INCLUDING: YOUR NAME DATE OF BIRTH CALL BACK NUMBER REASON FOR CALL**this is important as we prioritize the call backs  YOU WILL RECEIVE A CALL BACK THE SAME DAY AS LONG AS YOU CALL BEFORE 4:00 PM  At the [facility], you and your health needs are our priority. As part of our continuing mission to provide you with exceptional heart care, we have created designated Provider Care Teams. These Care Teams include your primary Cardiologist (physician) and Advanced Practice Providers (APPs- Physician Assistants and Nurse Practitioners) who all work together to provide you with the care you need, when you need it.   You may see any of the following providers on your designated Care Team at your next follow up: Dr Arvilla Meres Dr Marca Ancona Dr. Dorthula Nettles Dr. Clearnce Hasten Amy Filbert Schilder, NP Robbie Lis, [place] [facility] George, [place] Brynda Peon, NP [place] Lee, NP Karle Plumber, PharmD   Please be sure to bring in all your medications bottles to every appointment.    Thank you for choosing [facility]

## 2022-12-30 ENCOUNTER — Other Ambulatory Visit (HOSPITAL_BASED_OUTPATIENT_CLINIC_OR_DEPARTMENT_OTHER): Payer: Self-pay

## 2022-12-30 ENCOUNTER — Other Ambulatory Visit: Payer: Self-pay

## 2022-12-31 ENCOUNTER — Other Ambulatory Visit (HOSPITAL_BASED_OUTPATIENT_CLINIC_OR_DEPARTMENT_OTHER): Payer: Self-pay

## 2022-12-31 ENCOUNTER — Encounter (HOSPITAL_BASED_OUTPATIENT_CLINIC_OR_DEPARTMENT_OTHER): Payer: Self-pay

## 2022-12-31 ENCOUNTER — Ambulatory Visit (HOSPITAL_BASED_OUTPATIENT_CLINIC_OR_DEPARTMENT_OTHER): Payer: Medicare Other

## 2022-12-31 DIAGNOSIS — M6281 Muscle weakness (generalized): Secondary | ICD-10-CM

## 2022-12-31 DIAGNOSIS — R2689 Other abnormalities of gait and mobility: Secondary | ICD-10-CM | POA: Diagnosis not present

## 2022-12-31 MED ORDER — ZOLPIDEM TARTRATE 10 MG PO TABS
10.0000 mg | ORAL_TABLET | Freq: Every evening | ORAL | 0 refills | Status: DC | PRN
  Filled 2022-12-31: qty 30, 30d supply, fill #0

## 2022-12-31 NOTE — Therapy (Addendum)
 OUTPATIENT PHYSICAL THERAPY LOWER EXTREMITY TREATMENT DISCHARGE  PHYSICAL THERAPY DISCHARGE SUMMARY  Visits from Start of Care: 23  Current functional level related to goals / functional outcomes: indep   Remaining deficits: Use of AD for amb   Education / Equipment: Management of condition/HEP   Patient agrees to discharge. Patient goals were partially met. Patient is being discharged due to maximized rehab potential.     Patient Name: Kathryn Gibson MRN: [id number] DOB:September 11, 1933, 89 y.o., female Today's Date: 12/31/2022  END OF SESSION:  PT End of Session - 12/31/22 1310     Visit Number 23    Number of Visits 25    Date for PT Re-Evaluation 02/08/23    Authorization Type Progress note at 23    PT Start Time 1308    PT Stop Time 1350    PT Time Calculation (min) 42 min    Equipment Utilized During Treatment Gait belt    Activity Tolerance Patient tolerated treatment well    Behavior During Therapy [facility] for tasks assessed/performed                 Past Medical History:  Diagnosis Date   Arthritis    Atrial fibrillation (HCC)    Atypical chest pain 09/22/2020   Blood transfusion without reported diagnosis    2017   Bradycardia    s/p PPM   CAD in native artery 03/26/2021   Cataract    Chronic diastolic heart failure (HCC) 03/26/2021   Colonic polyp    adenomatous   Constipation    Diverticulosis    Hemorrhoids    Hyperlipidemia    Hypertension    Hypothyroidism    Irritable bowel syndrome (IBS)    Lower extremity edema 09/22/2020   Osteoporosis    osteopenia   Pacemaker 2010   UTI (urinary tract infection)    Past Surgical History:  Procedure Laterality Date   BREAST CYST ASPIRATION Left 1980s   COLONOSCOPY     ELECTROPHYSIOLOGIC STUDY N/A 07/01/2015   Procedure: Atrial Fibrillation Ablation;  Surgeon: Hillis Range, MD;  Location: [facility] INVASIVE CV LAB;  Service: Cardiovascular;  Laterality: N/A;   EYE SURGERY     heart ablation     for  atrial fib   HEMATOMA EVACUATION Right 07/15/2014   Procedure: EVACUATION Right Groin Hematoma;  Surgeon: Larina Earthly, MD;  Location[facility] OR;  Service: Vascular;  Laterality: Right;   NASAL ENDOSCOPY WITH EPISTAXIS CONTROL N/A 07/14/2014   Procedure: NASAL ENDOSCOPY WITH EPISTAXIS CONTROL;  Surgeon: Melvenia Beam, MD;  Location: [facility] OR;  Service: ENT;  Laterality: N/A;   NASAL ENDOSCOPY WITH EPISTAXIS CONTROL Bilateral 07/15/2014   Procedure: NASAL ENDOSCOPY WITH EPISTAXIS CONTROL;  Surgeon: Melvenia Beam, MD;  Location: [facility] OR;  Service: ENT;  Laterality: Bilateral;   PACEMAKER INSERTION  2010   implanted by Dr Deborah Chalk (MDT)   PPM GENERATOR CHANGEOUT N/A 08/16/2019   Procedure: PPM GENERATOR CHANGEOUT;  Surgeon: Hillis Range, MD;  Location: [facility] INVASIVE CV LAB;  Service: Cardiovascular;  Laterality: N/A;   RADIOLOGY WITH ANESTHESIA N/A 07/14/2014   Procedure: RADIOLOGY WITH ANESTHESIA;  Surgeon: Medication Radiologist, MD;  Location: [facility] OR;  Service: Radiology;  Laterality: N/A;   TEE WITHOUT CARDIOVERSION N/A 07/01/2015   Procedure: TRANSESOPHAGEAL ECHOCARDIOGRAM (TEE);  Surgeon: Quintella Reichert, MD;  Location: [facility] ENDOSCOPY;  Service: Cardiovascular;  Laterality: N/A;   Patient Active Problem List   Diagnosis Date Noted   Chronic diastolic heart failure (HCC) 03/26/2021  CAD in native artery 03/26/2021   Atypical chest pain 09/22/2020   Lower extremity edema 09/22/2020   Genital herpes simplex 09/18/2019   Fatigue 06/08/2018   Ozena 12/14/2016   Rectal pain 11/12/2016   Ptosis of eyelid 04/05/2016   Hypokalemia    Acute blood loss anemia    Hypomagnesemia    Chronic atrial fibrillation (HCC)    Essential hypertension    Other specified hypothyroidism    Posterior epistaxis    Hematoma complicating a procedure    Diastolic dysfunction 07/14/2014   Epistaxis, recurrent 07/14/2014   Sick sinus syndrome (HCC) 04/29/2014   Encounter for therapeutic drug monitoring 04/04/2013   Sciatica of left  side 11/22/2011   Aortic valve insufficiency 03/25/2011   Dyspnea 11/24/2010   Essential hypertension, benign 04/18/2010   Hypothyroidism 04/15/2010   PALPITATIONS 04/15/2010   BRADYCARDIA-TACHYCARDIA SYNDROME 12/11/2008   IRRITABLE BOWEL SYNDROME 12/11/2008   History of colonic polyps 12/11/2008   Constipation 11/13/2008   ABDOMINAL BLOATING 11/13/2008   COAGULOPATHY 07/03/2008   Constipation 07/03/2008   CHANGE IN BOWELS 07/03/2008   Disorder of thyroid 07/02/2008   Diverticulosis of colon 07/02/2008    PCP: Dr Chilton Greathouse   REFERRING PROVIDER: [name] NP  REFERRING DIAG:   Diagnosis  R53.81 (ICD-10-CM) - Physical deconditioning    THERAPY DIAG:  Other abnormalities of gait and mobility  Muscle weakness (generalized)  Rationale for Evaluation and Treatment: Rehabilitation  ONSET DATE: Around the pandemic she began having shortness of breath   SUBJECTIVE:   SUBJECTIVE STATEMENT: The patient reports that her breathing has been doing well.    PERTINENT HISTORY: A-fib, Chest pain 2022, lower extremity swelling, Pacemaker; Osteopenia,  PAIN:  Are you having pain?  General arthritis that comes and goes.   PRECAUTIONS: Pacemaker   RED FLAGS: None   WEIGHT BEARING RESTRICTIONS: No  FALLS:  Has patient fallen in last 6 months? No  LIVING ENVIRONMENT: Has steps she can walk but she doesn't do them much OCCUPATION:  Retired  Risk manager:  The pool, Walking, reading, going ot to dinner.   PLOF: Independent Uses a walker at this time for longer distances   PATIENT GOALS:  To get the strength in her legs back   NEXT MD VISIT: Nothing scheduled.   OBJECTIVE:   DIAGNOSTIC FINDINGS:  Nothing   PATIENT SURVEYS:  FOTO 55% at eval; 53% on 9/26  COGNITION: Overall cognitive status: Within functional limits for tasks assessed     SENSATION: WFL  EDEMA:  Bilateral LE edema controlled with ted hose and diuretics  MUSCLE LENGTH: POSTURE: No  Significant postural limitations  PALPATION: No tenderness to plapation   LOWER EXTREMITY ROM:  Active ROM Right eval Left eval  Hip flexion    Hip extension    Hip abduction    Hip adduction    Hip internal rotation    Hip external rotation    Knee flexion    Knee extension    Ankle dorsiflexion    Ankle plantarflexion    Ankle inversion    Ankle eversion     (Blank rows = not tested) within normal limits  LOWER EXTREMITY MMT:  MMT Right eval Left eval Right  10/4 Left 10/4 Right 11/19 Left 11/19  Hip flexion 15.5 14.8 19.3 17.3 24.0 22.8  Hip extension        Hip abduction 25.3 26.8 27.4 31.4 27.7 31.1  Hip adduction        Hip internal rotation  Hip external rotation        Knee flexion        Knee extension 23.9 18.5 30.1 23.7 24.1 22.9  Ankle dorsiflexion        Ankle plantarflexion        Ankle inversion        Ankle eversion         (Blank rows = not tested) WNL  LOWER EXTREMITY SPECIAL TESTS:   FUNCTIONAL TESTS:  6 minute walk test: 643   1 min seated rest break at 425 ' HR not able to be obtained 2nd to cold fingers walk test without walker- seated rest break at 11sec- 932ft total  5x sit to stand 13 sec; 9/23: 5xSTS: 8 seconds    Base line HR 75 bpm  GAIT: Uses walker but does not put much weight on the walker    TODAY'S TREATMENT:                                                                                                                              DATE:    11/22:  Review of goals HEP update and review  Marching on Airex 2 x 10 SBA Romberg stance on Airex with eyes closed 2 x 30 seconds Single-leg stance with fingertip support on Airex 2 x 20 seconds each Tandem walking along rail x 1 lap walking marches along rail x 2 laps  LF row 25lbs 2x10 LF hip abd 60lbs 3x10  11/19 LF hip abdcution 60 lbs 3x12  LF knee extension  2x15 15 lbs  LF triceps press down 2x15 25 lbs  LF row 2x1 25 lbs   Cybex  leg press 2x15 75  lbs 1x15 80 lbs   Cable rows RPE 3 x12 lbs   Tandem stance 2x30 sec hold   11/15 LF hip abdcution 55 lbs 3x12  LF knee extension  2x15 15 lbs  LF triceps press down 2x15 20 lbs  LF row 2x15 20 lbs   Cybex leg press 3x15 70 lbs   Air-ex narrow base eyes closed 2x30 sec hold  Tandem stance 2x30 sec hold each leg       11/6 LF hip abdcution 60 lbs 3x12  Stair case up and down   Cable Row 3x10 15 lbs  Cable extension 3x10 15 lbs   High extension  2x15 15 lbs   Row machine 3x15 25 lbs   Leg press 3x15 5 lbs    PATIENT EDUCATION:  Education details: HEP, symptom management, progression of activity  Person educated: Patient Education method: Explanation, Demonstration, Tactile cues, Verbal cues, and Handouts Education comprehension: verbalized understanding, returned demonstration, verbal cues required, tactile cues required, and needs further education  HOME EXERCISE PROGRAM: Access Code: [email or web address] URL: [email or web address] Date: 09/14/2022 Prepared by: Lorayne Bender  Exercises - Seated Knee Extension with Resistance  - 1 x daily - 7 x weekly - 3 sets - 15  reps - Seated Hip Abduction with Resistance  - 1 x daily - 7 x weekly - 3 sets - 15 reps - Seated Knee Lifts with Resistance  - 1 x daily - 7 x weekly - 3 sets - 10 reps  ASSESSMENT:  CLINICAL IMPRESSION:  Pt has attended 23 visits and has made excellent progress towards goals. She has been able to ambulate longer distances, though does continue to rely on walker as a precaution for SOB for longer distances. Pt able to increase to 926ft today with one seated rest break halfway through due to SOB. This was completed with AD and improved from initial assessment. Pt has met 3 of 4 short-term goals and 1 of 2 long-term goals.  Patient has reached maximum level of improvement with PT interventions and will do well with independent program at home and plans to utilize gym at  [facility].  Patient would also like to return to her pool exercises at [facility].  Patient was provided updated HEP and reviewed with verbalized understanding.  Patient discharged today due to her excellent progress.  OBJECTIVE IMPAIRMENTS: cardiopulmonary status limiting activity, difficulty walking, decreased strength, and impaired perceived functional ability.   ACTIVITY LIMITATIONS: standing, stairs, and locomotion level  PARTICIPATION LIMITATIONS: meal prep, cleaning, laundry, shopping, and community activity  PERSONAL FACTORS: Age and 1 comorbidity: multiple cardiovascular issues  are also affecting patient's functional outcome.   REHAB POTENTIAL: Good  CLINICAL DECISION MAKING: Evolving/moderate complexity  EVALUATION COMPLEXITY: Moderate   GOALS: Goals reviewed with patient? Yes  SHORT TERM GOALS: Target date:12/12/2022         Patient will increase gross bilateral lower extremity strength by 5 pounds Baseline: Goal status:  progressing 11/19 improved but not met   2.  Patient will increase 6-minute walk test distance by 300 feet and reduce rest breaks by 1 Baseline:  Goal status:  MET 11/22  3.  Patient will decrease time on 5 times sit to stand test by 3 seconds Baseline:  Goal status: MET  10/6  4.  Patient will be independent with base exercise program Baseline:  Goal status: MET 11/22   LONG TERM GOALS: Target date:  12/26/2022      Patient will walk to her meal hall without increased dyspnea Baseline:  Goal status: Hasn't tried without walker 11/22 ("I could probably go halfway.)  2.  Patient will go up and down steps without increase shortness of breath in order to improve community ambulation Baseline:  Goal status: MET 11/22  3.  Patient will be able to go to the pool and back without significant fatigue Baseline:  Goal status: IN PROGRESS hasn't done this 11/22   PLAN:  PT FREQUENCY: 1-2x/week  PT DURATION: 8 weeks  PLANNED  INTERVENTIONS: Therapeutic exercises, Therapeutic activity, Neuromuscular re-education, Balance training, Gait training, Patient/Family education, Self Care, Stair training, DME instructions, Aquatic Therapy; Electrical stimulation, Cryotherapy, Moist heat, Taping, Manual therapy, and Re-evaluation.   PLAN FOR NEXT SESSION:  Consider light stair training; consider intervals on nu-step or bike; monitor HR. Patient can ambualte about 1:30 min wouout a rest break. Work on increasing her time.  Consider gross exercise program.  Patient already has a strong home program to work on.   Donnel Saxon Remingtyn Depaola, PTA 12/31/2022, 2:07 PM   Therapist reviewed plan with PTA and agrees with DC.    Corrie Dandy [place]) [place] MPT 03/31/23 12:57 PM [facility] [address] [place], [place], [address] Phone: [phone]  Fax:  [phone]

## 2023-01-04 ENCOUNTER — Other Ambulatory Visit (HOSPITAL_BASED_OUTPATIENT_CLINIC_OR_DEPARTMENT_OTHER): Payer: Self-pay

## 2023-01-04 ENCOUNTER — Ambulatory Visit: Payer: Medicare Other | Attending: Cardiovascular Disease | Admitting: Cardiovascular Disease

## 2023-01-04 ENCOUNTER — Encounter: Payer: Self-pay | Admitting: Cardiovascular Disease

## 2023-01-04 VITALS — BP 104/60 | HR 64 | Ht 60.5 in | Wt 115.8 lb

## 2023-01-04 DIAGNOSIS — I495 Sick sinus syndrome: Secondary | ICD-10-CM | POA: Diagnosis not present

## 2023-01-04 DIAGNOSIS — I482 Chronic atrial fibrillation, unspecified: Secondary | ICD-10-CM

## 2023-01-04 LAB — CUP PACEART INCLINIC DEVICE CHECK
Battery Remaining Longevity: 88 mo
Battery Voltage: 2.99 V
Brady Statistic AP VP Percent: 98.14 %
Brady Statistic AP VS Percent: 0.22 %
Brady Statistic AS VP Percent: 0.86 %
Brady Statistic AS VS Percent: 0.78 %
Brady Statistic RA Percent Paced: 98.72 %
Brady Statistic RV Percent Paced: 98.98 %
Date Time Interrogation Session: 20241126132351
Implantable Lead Connection Status: 753985
Implantable Lead Connection Status: 753985
Implantable Lead Implant Date: 20101109
Implantable Lead Implant Date: 20101109
Implantable Lead Location: 753859
Implantable Lead Location: 753860
Implantable Lead Model: 4469
Implantable Lead Model: 4470
Implantable Lead Serial Number: 535369
Implantable Lead Serial Number: 657967
Implantable Pulse Generator Implant Date: 20210708
Lead Channel Impedance Value: 266 Ohm
Lead Channel Impedance Value: 399 Ohm
Lead Channel Impedance Value: 418 Ohm
Lead Channel Impedance Value: 437 Ohm
Lead Channel Pacing Threshold Amplitude: 0.5 V
Lead Channel Pacing Threshold Amplitude: 0.625 V
Lead Channel Pacing Threshold Amplitude: 0.75 V
Lead Channel Pacing Threshold Amplitude: 1 V
Lead Channel Pacing Threshold Pulse Width: 0.4 ms
Lead Channel Pacing Threshold Pulse Width: 0.4 ms
Lead Channel Pacing Threshold Pulse Width: 0.4 ms
Lead Channel Pacing Threshold Pulse Width: 0.4 ms
Lead Channel Sensing Intrinsic Amplitude: 0.25 mV
Lead Channel Sensing Intrinsic Amplitude: 0.25 mV
Lead Channel Sensing Intrinsic Amplitude: 3.125 mV
Lead Channel Sensing Intrinsic Amplitude: 3.125 mV
Lead Channel Setting Pacing Amplitude: 2.5 V
Lead Channel Setting Pacing Amplitude: 2.5 V
Lead Channel Setting Pacing Pulse Width: 0.4 ms
Lead Channel Setting Sensing Sensitivity: 1.2 mV
Zone Setting Status: 755011
Zone Setting Status: 755011

## 2023-01-04 NOTE — Progress Notes (Signed)
Cardiology Office Note Date:  01/04/2023  Patient ID:  Kathryn Gibson, Kathryn Gibson July 11, 1933, MRN [id number] PCP:  Chilton Greathouse, MD  Cardiologist:  Dr. Duke Salvia Electrophysiologist: Dr. Johney Frame    Chief Complaint:  6 mo  History of Present Illness: Kathryn Gibson is a 89 y.o. female with history of AFib, bradycardia w/PPM, HTN, hypothyroidism. She returns today for routine EP follow-up.  Since her last visit with [place] about 6 months ago, she saw Dr. Duke Salvia. At that time, she was having episodes of epistaxis.  Device information  MDT dual chamber PPM implanted 12/17/2008, gen change 08/16/2019  AFib hx 07/01/2015: PVI ablation, including Mitral isthmus and Cavo-tricuspid isthmus ablation noting Multiple atypical atrial flutter circuits observed too unstable for mapping  Has had an Atach  AAD  Amiodarone goes back at least to 2012 >>> STOPPED 2/2 abn LFTs Flecainide started Jan 2022      PHYSICAL EXAM:  VS:  BP 104/60 (BP Location: Left Arm, Patient Position: Sitting, Cuff Size: Normal)   Pulse 64   Ht 5' 0.5" (1.537 m)   Wt 115 lb 12.8 oz (52.5 kg)   SpO2 98%   BMI 22.24 kg/m  BMI: Body mass index is 22.24 kg/m. Well nourished, well developed, in no acute distress  HEENT: normocephalic, atraumatic  Neck: no JVD, carotid bruits or masses Cardiac:   RRR; no significant murmurs, no rubs, or gallops Lungs:  CTA b/l, no wheezing, rhonchi or rales  Abd: soft, nontender MS: no deformity or atrophy Ext:  trace is any edema noted b/l Skin: warm and dry, no rash Neuro:  No gross deficits appreciated Psych: euthymic mood, full affect  PPM site (R side) is stable, no tethering or discomfort   EKG:   EKG Interpretation Date/Time:  Tuesday January 04 2023 12:19:21 EST Ventricular Rate:  64 PR Interval:  198 QRS Duration:  220 QT Interval:  556 QTC Calculation: 573 R Axis:   -82  Text Interpretation: AV dual-paced rhythm When compared with ECG of 29-Dec-2022 13:53, Vent.  rate has increased BY   4 BPM Confirmed by York Pellant [phone]) on 01/04/2023 12:41:37 PM    TTE 08/16/2022 LVEF 55 to 60%.  No regional wall motion abnormalities.  Left and right atria are severely dilated.  PPM interrogation done today and reviewed by myself;  See Paceart   09/26/2020: stress myoview There was no ST segment deviation noted during stress. The study is normal. Negative for ischemia. This is a low risk study. This study was not gated.   07/01/2015; EPA/Ablation, Dr. Johney Frame CONCLUSIONS: 1. Atrial paced rhythm upon presentation.   2. Successful electrical isolation and anatomical encircling of all four pulmonary veins with radiofrequency current and a WACA approach.  Additional left atrial ablation was performed to create a standard box lesion along the posterior wall of the left atrium. 3. Multiple atypical atrial flutter circuits observed too unstable for mapping today.  Mitral isthmus and Cavo-tricuspid isthmus ablation performed today 4. Successful cardioversion to sinus rhythm 5. No early apparent complications   04/11/2020: TTE IMPRESSIONS   1. Left ventricular ejection fraction, by estimation, is 60 to 65%. The  left ventricle has normal function. The left ventricle has no regional  wall motion abnormalities. There is mild left ventricular hypertrophy.  Left ventricular diastolic parameters  are consistent with Grade III diastolic dysfunction (restrictive).  Elevated left atrial pressure.   2. Right ventricular systolic function is normal. The right ventricular  size is normal. There is mildly  elevated pulmonary artery systolic  pressure. The estimated right ventricular systolic pressure is 35.7 mmHg.   3. Left atrial size was moderately dilated.   4. Right atrial size was mildly dilated.   5. The mitral valve is normal in structure. Mild to moderate mitral valve  regurgitation.   6. Tricuspid valve regurgitation is mild to moderate.   7. The aortic valve  is tricuspid. Aortic valve regurgitation is moderate.  Mild aortic valve sclerosis is present, with no evidence of aortic valve  stenosis.   8. The inferior vena cava is normal in size with greater than 50%  respiratory variability, suggesting right atrial pressure of 3 mmHg.      Recent Labs: 10/22/2022: BNP 529.5 12/24/2022: ALT 29; BUN 26; Creatinine, Ser 1.08; Potassium 3.8; Sodium 142  12/24/2022: Chol/HDL Ratio 2.3; Cholesterol, Total 122; HDL 54; LDL Chol Calc (NIH) 56; Triglycerides 55   Estimated Creatinine Clearance: 27.1 mL/min (A) (by C-G formula based on SCr of 1.08 mg/dL (H)).   Wt Readings from Last 3 Encounters:  01/04/23 115 lb 12.8 oz (52.5 kg)  12/29/22 116 lb 3.2 oz (52.7 kg)  12/23/22 117 lb 3.2 oz (53.2 kg)     Other studies reviewed: Additional studies/records reviewed today include: summarized above  ASSESSMENT AND PLAN:  1. PPM     Intact function     No programming changes made     She is device dependent today   2. Paroxysmal AFib     Has had an Atach as well     CHA2DS2vasc is 4, on xarelto appropriately dosed -labs September 2024 reviewed     On flecainide, dilt      Device-detected < 0.1 % burden  She remains happy with her arrhythmia burden, does not want to make any changes. We discussed alternatives to flecainide, which she has tolerated well to-date. Her renal function is a little tenuous and she may have some mild LVH on TTE. I think the flecainide should be discontinued if she has any worsening in renal function. At present, she feels much better in normal rhythm and is symptomatic with her brief arrhythmia episodes.   .  3. HTN     Looks good         Signed, Maurice Small, MD  01/04/2023 12:41 PM     [facility] [address] [place] [address] [phone] (office)  [phone] (fax)

## 2023-01-04 NOTE — Patient Instructions (Signed)
Medication Instructions:  Your physician recommends that you continue on your current medications as directed. Please refer to the Current Medication list given to you today. *If you need a refill on your cardiac medications before your next appointment, please call your pharmacy*   Follow-Up: At [facility], you and your health needs are our priority.  As part of our continuing mission to provide you with exceptional heart care, we have created designated Provider Care Teams.  These Care Teams include your primary Cardiologist (physician) and Advanced Practice Providers (APPs -  Physician Assistants and Nurse Practitioners) who all work together to provide you with the care you need, when you need it.  We recommend signing up for the patient portal called "MyChart".  Sign up information is provided on this After Visit Summary.  MyChart is used to connect with patients for Virtual Visits (Telemedicine).  Patients are able to view lab/test results, encounter notes, upcoming appointments, etc.  Non-urgent messages can be sent to your provider as well.   To learn more about what you can do with MyChart, go to [email or web address].    Your next appointment:   6 month(s)  Provider:   Augustus Mealor, MD  

## 2023-01-05 ENCOUNTER — Encounter (HOSPITAL_BASED_OUTPATIENT_CLINIC_OR_DEPARTMENT_OTHER): Payer: Self-pay

## 2023-01-05 DIAGNOSIS — D1801 Hemangioma of skin and subcutaneous tissue: Secondary | ICD-10-CM | POA: Diagnosis not present

## 2023-01-05 DIAGNOSIS — Z85828 Personal history of other malignant neoplasm of skin: Secondary | ICD-10-CM | POA: Diagnosis not present

## 2023-01-05 DIAGNOSIS — L821 Other seborrheic keratosis: Secondary | ICD-10-CM | POA: Diagnosis not present

## 2023-01-11 ENCOUNTER — Other Ambulatory Visit (HOSPITAL_BASED_OUTPATIENT_CLINIC_OR_DEPARTMENT_OTHER): Payer: Self-pay

## 2023-01-12 ENCOUNTER — Other Ambulatory Visit (HOSPITAL_BASED_OUTPATIENT_CLINIC_OR_DEPARTMENT_OTHER): Payer: Self-pay

## 2023-01-13 ENCOUNTER — Other Ambulatory Visit (HOSPITAL_BASED_OUTPATIENT_CLINIC_OR_DEPARTMENT_OTHER): Payer: Self-pay

## 2023-01-14 ENCOUNTER — Other Ambulatory Visit (HOSPITAL_COMMUNITY): Payer: Self-pay | Admitting: Cardiology

## 2023-01-14 DIAGNOSIS — I5032 Chronic diastolic (congestive) heart failure: Secondary | ICD-10-CM | POA: Diagnosis not present

## 2023-01-15 LAB — BASIC METABOLIC PANEL
BUN/Creatinine Ratio: 25 (ref 12–28)
BUN: 31 mg/dL — ABNORMAL HIGH (ref 8–27)
CO2: 25 mmol/L (ref 20–29)
Calcium: 9.4 mg/dL (ref 8.7–10.3)
Chloride: 96 mmol/L (ref 96–106)
Creatinine, Ser: 1.23 mg/dL — ABNORMAL HIGH (ref 0.57–1.00)
Glucose: 104 mg/dL — ABNORMAL HIGH (ref 70–99)
Potassium: 3.8 mmol/L (ref 3.5–5.2)
Sodium: 139 mmol/L (ref 134–144)
eGFR: 43 mL/min/{1.73_m2} — ABNORMAL LOW (ref >59)

## 2023-01-15 LAB — BRAIN NATRIURETIC PEPTIDE: BNP: 358.5 pg/mL — ABNORMAL HIGH (ref 0.0–100.0)

## 2023-01-16 ENCOUNTER — Encounter (HOSPITAL_BASED_OUTPATIENT_CLINIC_OR_DEPARTMENT_OTHER): Payer: Self-pay | Admitting: Cardiovascular Disease

## 2023-01-17 ENCOUNTER — Encounter: Payer: Medicare Other | Admitting: Cardiovascular Disease

## 2023-01-21 ENCOUNTER — Other Ambulatory Visit (HOSPITAL_BASED_OUTPATIENT_CLINIC_OR_DEPARTMENT_OTHER): Payer: Self-pay

## 2023-01-21 ENCOUNTER — Encounter (HOSPITAL_BASED_OUTPATIENT_CLINIC_OR_DEPARTMENT_OTHER): Payer: Self-pay

## 2023-01-21 MED ORDER — DOXEPIN HCL 3 MG PO TABS
1.0000 | ORAL_TABLET | Freq: Every day | ORAL | 1 refills | Status: DC
  Filled 2023-01-21: qty 30, 30d supply, fill #0

## 2023-01-24 ENCOUNTER — Encounter: Payer: Self-pay | Admitting: Cardiovascular Disease

## 2023-01-25 ENCOUNTER — Telehealth: Payer: Self-pay

## 2023-01-25 NOTE — Telephone Encounter (Signed)
Some uptick in atrial arrhythmia burden starting after IOV w/ AM MD 11/26. Called pt to assess symptoms. Reports increased palps in side and chest. Some nonspecific symptoms.  Reports not taking spiro d.t uptick in AF coorelating with spiro initiation. Okay with f/u in AF clinic.

## 2023-01-28 ENCOUNTER — Other Ambulatory Visit (HOSPITAL_BASED_OUTPATIENT_CLINIC_OR_DEPARTMENT_OTHER): Payer: Self-pay

## 2023-01-31 ENCOUNTER — Ambulatory Visit (HOSPITAL_COMMUNITY)
Admission: RE | Admit: 2023-01-31 | Discharge: 2023-01-31 | Disposition: A | Discharge status: Home / Self Care | Payer: Medicare Other | Source: Ambulatory Visit | Attending: Internal Medicine | Admitting: Internal Medicine

## 2023-01-31 VITALS — BP 110/50 | HR 61 | Ht 60.5 in | Wt 113.4 lb

## 2023-01-31 DIAGNOSIS — I4891 Unspecified atrial fibrillation: Secondary | ICD-10-CM | POA: Diagnosis not present

## 2023-01-31 DIAGNOSIS — D6869 Other thrombophilia: Secondary | ICD-10-CM

## 2023-01-31 DIAGNOSIS — I4892 Unspecified atrial flutter: Secondary | ICD-10-CM | POA: Insufficient documentation

## 2023-01-31 DIAGNOSIS — I959 Hypotension, unspecified: Secondary | ICD-10-CM | POA: Diagnosis not present

## 2023-01-31 DIAGNOSIS — Z79899 Other long term (current) drug therapy: Secondary | ICD-10-CM | POA: Diagnosis not present

## 2023-01-31 DIAGNOSIS — I5032 Chronic diastolic (congestive) heart failure: Secondary | ICD-10-CM | POA: Insufficient documentation

## 2023-01-31 DIAGNOSIS — I480 Paroxysmal atrial fibrillation: Secondary | ICD-10-CM | POA: Diagnosis not present

## 2023-01-31 DIAGNOSIS — Z7901 Long term (current) use of anticoagulants: Secondary | ICD-10-CM | POA: Diagnosis not present

## 2023-01-31 NOTE — Progress Notes (Signed)
Primary Care Physician: Chilton Greathouse, MD Referring Physician: self referred EP: Dr. Corine Shelter is a 89 y.o. female with a h/o afib previously on amiodarone, stopped previously for elevated liver enzymes. She had a recent gen change out. At that time she was having some soft BP's and BB was stopped. After a few days she was calling the afib office with c/o that she felt her HR was running too fast. He was started back on low dose BB and called the office again on Friday, again with HR's in the 90's, 'heart beating too fast."  We resumed her piror BB dose. Hr had one dip in her BP, drank some water and BP improved. Asked to be seen this am as she felt her heart rate was irregular. Now in the office, she is a paced. She does not  feel the irregular HB now. She is due Thursday for wound check in the device clinic and can ask for her device to be interrogated to further understand heart irregularities.   Mrs Kovack wanted to be seen today,11/02/19, for having 2 episodes of irregular rhythm 2 days in a row as well as she doesn't understand why she is short of breath with waking but can do water aerobic's for an hour with  weights and pool pull up's and does not have  any shortness of breath with these activities. She  is also concerned that her weight use to run 124-128 lbs and now it runs 128 to 132 lbs. She  did have her thyroid med adjusted the end of August for an elevated TSH. She is in a junctional rhythm today.   Afib clinic,03/21/20, I  saw Mrs Schmidbauer 2 weeks ago for f/u of starting flecainide 50 mg bid. At that time, had noted less palpitations. Now she tells me that she was taking just once a day when I saw her, but for the last 2 weeks has been taking bid. She also reports that last Sunday pm, she woke up and was having difficulty catching  her breath. She went to [facility] and was told she may have an early pneumonia and was placed on steroids and antibiotics. She had f/u with her PCP  that though it was HF and stopped  meds but after just 3 days on the meds, she had improved. She saw Edd Fabian this am and he ordered an echo that is pending 3/4. She has not had any further difficulty. She has not noted any further palpitations.    On follow up 01/31/23. She is currently in AV paced rhythm. She contacted office on 12/16 noting palpitations. Device interrogation showed recent increase in burden; she stopped taking spironolactone as she believes increase in burden correlated with start of medication. She is on flecainide 50 mg BID. She is on diltiazem 360 mg daily. She has noted since making appointment she has not had any episodes. No bleeding issues on Xarelto 15 mg daily.    Today, she denies symptoms of palpitations, chest pain, shortness of breath, orthopnea, PND, lower extremity edema, dizziness, presyncope, syncope, or neurologic sequela. The patient is tolerating medications without difficulties and is otherwise without complaint today.   Past Medical History:  Diagnosis Date   Arthritis    Atrial fibrillation (HCC)    Atypical chest pain 09/22/2020   Blood transfusion without reported diagnosis    2017   Bradycardia    s/p PPM   CAD in native artery 03/26/2021   Cataract  Chronic diastolic heart failure (HCC) 03/26/2021   Colonic polyp    adenomatous   Constipation    Diverticulosis    Hemorrhoids    Hyperlipidemia    Hypertension    Hypothyroidism    Irritable bowel syndrome (IBS)    Lower extremity edema 09/22/2020   Osteoporosis    osteopenia   Pacemaker 2010   UTI (urinary tract infection)    Past Surgical History:  Procedure Laterality Date   BREAST CYST ASPIRATION Left 1980s   COLONOSCOPY     ELECTROPHYSIOLOGIC STUDY N/A 07/01/2015   Procedure: Atrial Fibrillation Ablation;  Surgeon: Hillis Range, MD;  Location: [facility] INVASIVE CV LAB;  Service: Cardiovascular;  Laterality: N/A;   EYE SURGERY     heart ablation     for atrial fib   HEMATOMA  EVACUATION Right 07/15/2014   Procedure: EVACUATION Right Groin Hematoma;  Surgeon: Larina Earthly, MD;  Location: [facility] OR;  Service: Vascular;  Laterality: Right;   NASAL ENDOSCOPY WITH EPISTAXIS CONTROL N/A 07/14/2014   Procedure: NASAL ENDOSCOPY WITH EPISTAXIS CONTROL;  Surgeon: Melvenia Beam, MD;  Location: [facility] OR;  Service: ENT;  Laterality: N/A;   NASAL ENDOSCOPY WITH EPISTAXIS CONTROL Bilateral 07/15/2014   Procedure: NASAL ENDOSCOPY WITH EPISTAXIS CONTROL;  Surgeon: Melvenia Beam, MD;  Location: [facility] OR;  Service: ENT;  Laterality: Bilateral;   PACEMAKER INSERTION  2010   implanted by Dr Deborah Chalk (MDT)   PPM GENERATOR CHANGEOUT N/A 08/16/2019   Procedure: PPM GENERATOR CHANGEOUT;  Surgeon: Hillis Range, MD;  Location: [facility] INVASIVE CV LAB;  Service: Cardiovascular;  Laterality: N/A;   RADIOLOGY WITH ANESTHESIA N/A 07/14/2014   Procedure: RADIOLOGY WITH ANESTHESIA;  Surgeon: Medication Radiologist, MD;  Location: [facility] OR;  Service: Radiology;  Laterality: N/A;   TEE WITHOUT CARDIOVERSION N/A 07/01/2015   Procedure: TRANSESOPHAGEAL ECHOCARDIOGRAM (TEE);  Surgeon: Quintella Reichert, MD;  Location: [facility] ENDOSCOPY;  Service: Cardiovascular;  Laterality: N/A;    Current Outpatient Medications  Medication Sig Dispense Refill   acetaminophen (TYLENOL) 500 MG tablet Take 500-1,000 mg by mouth as needed for moderate pain (pain score 4-6) or headache.     Artificial Tear Ointment (DRY EYES OP) Place 1 drop into both eyes daily as needed (Dry eye).     Ascorbic Acid (VITAMIN C) 1000 MG tablet Take 1,000 mg by mouth daily.       bumetanide (BUMEX) 2 MG tablet Take 1 tablet (2 mg total) by mouth 2 (two) times daily. Ok to use an extra 1/2 tablet as needed for swelling     calcium carbonate (OSCAL) 1500 (600 Ca) MG TABS tablet Take 1 tablet by mouth daily.     cholecalciferol (VITAMIN D) 1000 UNITS tablet Take 1,000 Units by mouth daily.       Coenzyme Q10 (CO Q-10 PO) Take 1 tablet by mouth daily.     cromolyn (OPTICROM) 4  % ophthalmic solution Instill 1 drop into both eyes four times a day 10 mL 11   dapagliflozin propanediol (FARXIGA) 10 MG TABS tablet Take 1 tablet (10 mg total) by mouth daily before breakfast. 30 tablet 5   diltiazem (CARDIZEM CD) 360 MG 24 hr capsule Take 1 capsule (360 mg total) by mouth daily. 90 capsule 2   diltiazem (CARDIZEM) 30 MG tablet Take 1/2 to 1 tablet every 4 hours AS NEEDED for heart rate >100 as long as top blood pressure >100. 45 tablet 3   Doxepin HCl 3 MG TABS Take 1 tablet (3 mg total)  by mouth at bedtime. 30 tablet 1   famotidine (PEPCID) 20 MG tablet Take 20 mg by mouth daily.     flecainide (TAMBOCOR) 50 MG tablet Take 1 tablet (50 mg total) by mouth 2 (two) times daily. 60 tablet 11   levothyroxine (SYNTHROID) 50 MCG tablet Take 1 tablet in the morning on an empty stomach by mouth once a week for 90 days 36 tablet 3   levothyroxine (SYNTHROID) 75 MCG tablet TAKE 75 MCG BY MOUTH FOR 6 DAYS A WEEK (TAKE 50 MCG THE REMAINING 1 DAY OF THE WEEK). Oral 90 days 78 tablet 3   methylcellulose (CITRUCEL) oral powder Take 2 packets by mouth daily.     metoprolol succinate (TOPROL XL) 50 MG 24 hr tablet Take 1.5 tablets (75 mg total) by mouth 2 (two) times daily. 270 tablet 3   Multiple Vitamins-Minerals (ICAPS AREDS 2 PO) Take 1 capsule by mouth daily.      Omega-3 Fatty Acids (FISH OIL) 1200 MG CAPS Take 1,200 mg by mouth daily. Mega Red.     polyethylene glycol (MIRALAX / GLYCOLAX) 17 g packet Take 17 g by mouth as needed.     pravastatin (PRAVACHOL) 10 MG tablet Take 1 tablet (10 mg total) by mouth daily. 90 tablet 2   Probiotic Product (ALIGN PO) Take 1 capsule by mouth daily.     Rivaroxaban (XARELTO) 15 MG TABS tablet Take 1 tablet (15 mg total) by mouth daily with supper. 90 tablet 2   senna-docusate (SENOKOT S) 8.6-50 MG tablet Take 1 tablet by mouth as needed for mild constipation.     sodium chloride (OCEAN) 0.65 % SOLN nasal spray Place 1 spray into both nostrils daily.      Zinc 50 MG CAPS Take 50 mg by mouth daily.      zolpidem (AMBIEN) 10 MG tablet Take 10 mg by mouth at bedtime as needed for sleep.     spironolactone (ALDACTONE) 25 MG tablet Take 0.5 tablets (12.5 mg total) by mouth daily. (Patient not taking: Reported on 01/31/2023) 45 tablet 3   No current facility-administered medications for this encounter.    Allergies  Allergen Reactions   Trazodone And Nefazodone     "twitching" and trembling   ROS- All systems are reviewed and negative except as per the HPI above  Physical Exam: Vitals:   01/31/23 1358  BP: (!) 110/50  Pulse: 61  Weight: 51.4 kg  Height: 5' 0.5" (1.537 m)    Wt Readings from Last 3 Encounters:  01/31/23 51.4 kg  01/04/23 52.5 kg  12/29/22 52.7 kg    Labs: Lab Results  Component Value Date   NA 139 01/14/2023   K 3.8 01/14/2023   CL 96 01/14/2023   CO2 25 01/14/2023   GLUCOSE 104 (H) 01/14/2023   BUN 31 (H) 01/14/2023   CREATININE 1.23 (H) 01/14/2023   CALCIUM 9.4 01/14/2023   MG 2.2 02/27/2020   Lab Results  Component Value Date   INR 2.3 06/24/2015   Lab Results  Component Value Date   CHOL 122 12/24/2022   HDL 54 12/24/2022   LDLCALC 56 12/24/2022   TRIG 55 12/24/2022   GEN- The patient is well appearing, alert and oriented x 3 today.   Neck - no JVD or carotid bruit noted Lungs- Clear to ausculation bilaterally, normal work of breathing Heart- Regular rate and rhythm, no murmurs, rubs or gallops, PMI not laterally displaced Extremities- no clubbing, cyanosis, or edema Skin - no  rash or ecchymosis noted   EKG-  Vent. rate 61 BPM PR interval 198 ms QRS duration 210 ms QT/QTcB 554/557 ms P-R-T axes 54 -81 96 AV dual-paced rhythm Abnormal ECG When compared with ECG of 04-Jan-2023 12:19, PREVIOUS ECG IS PRESENT  ECHO 08/16/22: 1. Compared with the echo 04/2020, tricuspid regurgitation has increased  from mild/moderate to severe.   2. Left ventricular ejection fraction, by estimation,  is 55 to 60%. The  left ventricle has normal function. The left ventricle has no regional  wall motion abnormalities. There is mild concentric left ventricular  hypertrophy. Left ventricular diastolic  parameters are consistent with Grade II diastolic dysfunction  (pseudonormalization).   3. Right ventricular systolic function is normal. The right ventricular  size is normal. There is moderately elevated pulmonary artery systolic  pressure.   4. Left atrial size was severely dilated.   5. Right atrial size was severely dilated.   6. Mitral valve regurgitant volume 45 mL. The mitral valve is normal in  structure. Moderate mitral valve regurgitation. No evidence of mitral  stenosis.   7. Tricuspid valve regurgitation is severe.   8. The aortic valve is tricuspid. Aortic valve regurgitation is moderate.  No aortic stenosis is present. Aortic regurgitation PHT measures 351 msec.   9. There is mild (Grade II) atheroma plaque involving the ascending  aorta.  10. The inferior vena cava is dilated in size with >50% respiratory  variability, suggesting right atrial pressure of 8 mmHg.    Assessment and Plan: 1. Afib/flutter/atrial tach  She is currently in AV paced rhythm.  Device interrogation shows recent increase in burden but patient notes since making appointment she has not had any episodes. After discussion for plan of care, patient wishes to continue on flecainide 50 mg BID without changes. In the future, if she has increased burden can consider adjusting to 75 mg BID.   Intervals are stable. Continue flecainide 50 mg BID. Continue metoprolol 75 mg BID.   2. CHA2DS2VASc score of at least 5  Continue Xarelto 15 mg daily   3. Hypotension Stable today   Follow up Afib clinic prn.   Justin Mend, PA-C Afib Clinic [facility] [address] [place], [place] [address] [phone]

## 2023-02-01 ENCOUNTER — Other Ambulatory Visit (HOSPITAL_BASED_OUTPATIENT_CLINIC_OR_DEPARTMENT_OTHER): Payer: Self-pay | Admitting: Cardiovascular Disease

## 2023-02-01 ENCOUNTER — Other Ambulatory Visit (HOSPITAL_BASED_OUTPATIENT_CLINIC_OR_DEPARTMENT_OTHER): Payer: Self-pay

## 2023-02-01 MED ORDER — ZOLPIDEM TARTRATE 10 MG PO TABS
10.0000 mg | ORAL_TABLET | Freq: Every day | ORAL | 0 refills | Status: DC
  Filled 2023-02-01: qty 30, 30d supply, fill #0

## 2023-02-03 ENCOUNTER — Other Ambulatory Visit: Payer: Self-pay

## 2023-02-03 ENCOUNTER — Other Ambulatory Visit (HOSPITAL_BASED_OUTPATIENT_CLINIC_OR_DEPARTMENT_OTHER): Payer: Self-pay

## 2023-02-03 MED ORDER — BUMETANIDE 2 MG PO TABS
2.0000 mg | ORAL_TABLET | Freq: Two times a day (BID) | ORAL | 3 refills | Status: DC
  Filled 2023-02-03: qty 180, 90d supply, fill #0

## 2023-02-04 ENCOUNTER — Other Ambulatory Visit (HOSPITAL_BASED_OUTPATIENT_CLINIC_OR_DEPARTMENT_OTHER): Payer: Self-pay

## 2023-02-10 ENCOUNTER — Other Ambulatory Visit (HOSPITAL_BASED_OUTPATIENT_CLINIC_OR_DEPARTMENT_OTHER): Payer: Self-pay

## 2023-02-11 ENCOUNTER — Ambulatory Visit (INDEPENDENT_AMBULATORY_CARE_PROVIDER_SITE_OTHER): Payer: Self-pay

## 2023-02-11 DIAGNOSIS — I495 Sick sinus syndrome: Secondary | ICD-10-CM | POA: Diagnosis not present

## 2023-02-11 LAB — CUP PACEART REMOTE DEVICE CHECK
Battery Remaining Longevity: 84 mo
Battery Voltage: 2.99 V
Brady Statistic AP VP Percent: 89.61 %
Brady Statistic AP VS Percent: 0.89 %
Brady Statistic AS VP Percent: 4.29 %
Brady Statistic AS VS Percent: 5.2 %
Brady Statistic RA Percent Paced: 92.15 %
Brady Statistic RV Percent Paced: 93.77 %
Date Time Interrogation Session: 20250103003232
Implantable Lead Connection Status: 753985
Implantable Lead Connection Status: 753985
Implantable Lead Implant Date: 20101109
Implantable Lead Implant Date: 20101109
Implantable Lead Location: 753859
Implantable Lead Location: 753860
Implantable Lead Model: 4469
Implantable Lead Model: 4470
Implantable Lead Serial Number: 535369
Implantable Lead Serial Number: 657967
Implantable Pulse Generator Implant Date: 20210708
Lead Channel Impedance Value: 247 Ohm
Lead Channel Impedance Value: 380 Ohm
Lead Channel Impedance Value: 380 Ohm
Lead Channel Impedance Value: 399 Ohm
Lead Channel Pacing Threshold Amplitude: 0.625 V
Lead Channel Pacing Threshold Amplitude: 1 V
Lead Channel Pacing Threshold Pulse Width: 0.4 ms
Lead Channel Pacing Threshold Pulse Width: 0.4 ms
Lead Channel Sensing Intrinsic Amplitude: 0.125 mV
Lead Channel Sensing Intrinsic Amplitude: 0.125 mV
Lead Channel Sensing Intrinsic Amplitude: 3.75 mV
Lead Channel Sensing Intrinsic Amplitude: 3.75 mV
Lead Channel Setting Pacing Amplitude: 2.5 V
Lead Channel Setting Pacing Amplitude: 2.5 V
Lead Channel Setting Pacing Pulse Width: 0.4 ms
Lead Channel Setting Sensing Sensitivity: 1.2 mV
Zone Setting Status: 755011
Zone Setting Status: 755011

## 2023-02-15 ENCOUNTER — Other Ambulatory Visit (HOSPITAL_BASED_OUTPATIENT_CLINIC_OR_DEPARTMENT_OTHER): Payer: Self-pay

## 2023-02-16 DIAGNOSIS — Z961 Presence of intraocular lens: Secondary | ICD-10-CM | POA: Diagnosis not present

## 2023-02-16 DIAGNOSIS — H04123 Dry eye syndrome of bilateral lacrimal glands: Secondary | ICD-10-CM | POA: Diagnosis not present

## 2023-02-23 ENCOUNTER — Other Ambulatory Visit (HOSPITAL_BASED_OUTPATIENT_CLINIC_OR_DEPARTMENT_OTHER): Payer: Self-pay

## 2023-02-23 MED ORDER — CROMOLYN SODIUM 4 % OP SOLN
1.0000 [drp] | Freq: Four times a day (QID) | OPHTHALMIC | 11 refills | Status: AC
  Filled 2023-02-23: qty 10, 50d supply, fill #0
  Filled 2023-05-31: qty 10, 50d supply, fill #1
  Filled 2023-08-21: qty 10, 50d supply, fill #2
  Filled 2024-01-04: qty 10, 50d supply, fill #3

## 2023-02-24 ENCOUNTER — Other Ambulatory Visit (HOSPITAL_BASED_OUTPATIENT_CLINIC_OR_DEPARTMENT_OTHER): Payer: Self-pay | Admitting: Family

## 2023-02-24 ENCOUNTER — Other Ambulatory Visit (HOSPITAL_BASED_OUTPATIENT_CLINIC_OR_DEPARTMENT_OTHER): Payer: Self-pay

## 2023-02-24 DIAGNOSIS — I5032 Chronic diastolic (congestive) heart failure: Secondary | ICD-10-CM

## 2023-02-24 MED ORDER — DAPAGLIFLOZIN PROPANEDIOL 10 MG PO TABS
10.0000 mg | ORAL_TABLET | Freq: Every day | ORAL | 5 refills | Status: DC
  Filled 2023-02-24: qty 30, 30d supply, fill #0
  Filled 2023-03-22: qty 30, 30d supply, fill #1
  Filled 2023-04-25: qty 30, 30d supply, fill #2
  Filled 2023-05-23: qty 30, 30d supply, fill #3
  Filled 2023-06-21: qty 30, 30d supply, fill #4
  Filled 2023-07-25 (×2): qty 30, 30d supply, fill #5

## 2023-02-25 ENCOUNTER — Other Ambulatory Visit (HOSPITAL_BASED_OUTPATIENT_CLINIC_OR_DEPARTMENT_OTHER): Payer: Self-pay

## 2023-03-03 ENCOUNTER — Other Ambulatory Visit (HOSPITAL_BASED_OUTPATIENT_CLINIC_OR_DEPARTMENT_OTHER): Payer: Self-pay

## 2023-03-03 DIAGNOSIS — I110 Hypertensive heart disease with heart failure: Secondary | ICD-10-CM | POA: Diagnosis not present

## 2023-03-09 ENCOUNTER — Telehealth: Payer: Self-pay | Admitting: Physician Assistant

## 2023-03-09 ENCOUNTER — Ambulatory Visit (HOSPITAL_COMMUNITY)
Admission: RE | Admit: 2023-03-09 | Discharge: 2023-03-09 | Disposition: A | Discharge status: Home / Self Care | Payer: Medicare Other | Source: Ambulatory Visit | Attending: Cardiology | Admitting: Cardiology

## 2023-03-09 VITALS — BP 126/66 | HR 77 | Wt 117.2 lb

## 2023-03-09 DIAGNOSIS — I5032 Chronic diastolic (congestive) heart failure: Secondary | ICD-10-CM | POA: Insufficient documentation

## 2023-03-09 DIAGNOSIS — I480 Paroxysmal atrial fibrillation: Secondary | ICD-10-CM | POA: Diagnosis not present

## 2023-03-09 DIAGNOSIS — I110 Hypertensive heart disease with heart failure: Secondary | ICD-10-CM | POA: Diagnosis not present

## 2023-03-09 DIAGNOSIS — Z950 Presence of cardiac pacemaker: Secondary | ICD-10-CM | POA: Insufficient documentation

## 2023-03-09 DIAGNOSIS — Z79899 Other long term (current) drug therapy: Secondary | ICD-10-CM | POA: Insufficient documentation

## 2023-03-09 DIAGNOSIS — I495 Sick sinus syndrome: Secondary | ICD-10-CM | POA: Diagnosis not present

## 2023-03-09 DIAGNOSIS — Z8249 Family history of ischemic heart disease and other diseases of the circulatory system: Secondary | ICD-10-CM | POA: Insufficient documentation

## 2023-03-09 DIAGNOSIS — Z7901 Long term (current) use of anticoagulants: Secondary | ICD-10-CM | POA: Insufficient documentation

## 2023-03-09 DIAGNOSIS — I071 Rheumatic tricuspid insufficiency: Secondary | ICD-10-CM | POA: Diagnosis not present

## 2023-03-09 LAB — BASIC METABOLIC PANEL
Anion gap: 13 (ref 5–15)
BUN: 29 mg/dL — ABNORMAL HIGH (ref 8–23)
CO2: 28 mmol/L (ref 22–32)
Calcium: 9.3 mg/dL (ref 8.9–10.3)
Chloride: 97 mmol/L — ABNORMAL LOW (ref 98–111)
Creatinine, Ser: 1.23 mg/dL — ABNORMAL HIGH (ref 0.44–1.00)
GFR, Estimated: 43 mL/min — ABNORMAL LOW (ref >60)
Glucose, Bld: 90 mg/dL (ref 70–99)
Potassium: 3.5 mmol/L (ref 3.5–5.1)
Sodium: 138 mmol/L (ref 135–145)

## 2023-03-09 LAB — BRAIN NATRIURETIC PEPTIDE: B Natriuretic Peptide: 553.7 pg/mL — ABNORMAL HIGH (ref 0.0–100.0)

## 2023-03-09 NOTE — Telephone Encounter (Signed)
Inbound call from patient stating she has had a change in her bowel habits and having a lot of bloating. Patient is scheduled for 3/10. Requesting a call to discuss options to help in the meantime. Please advise, thank you.

## 2023-03-09 NOTE — Patient Instructions (Addendum)
Take 3 mg of Bumex for the next 2 DOSES, then back to regular dose.  Labs done today, your results will be available in MyChart, we will contact you for abnormal readings.  Your physician has requested that you have an echocardiogram. Echocardiography is a painless test that uses sound waves to create images of your heart. It provides your doctor with information about the size and shape of your heart and how well your heart's chambers and valves are working. This procedure takes approximately one hour. There are no restrictions for this procedure. Please do NOT wear cologne, perfume, aftershave, or lotions (deodorant is allowed). Please arrive 15 minutes prior to your appointment time.  Please note: We ask at that you not bring children with you during ultrasound (echo/ vascular) testing. Due to room size and safety concerns, children are not allowed in the ultrasound rooms during exams. Our front office staff cannot provide observation of children in our lobby area while testing is being conducted. An adult accompanying a patient to their appointment will only be allowed in the ultrasound room at the discretion of the ultrasound technician under special circumstances. We apologize for any inconvenience.  Your physician recommends that you schedule a follow-up appointment in: 3 months with an echocardiogram ( April) ** PLEASE CALL THE OFFICE IN MID FEBRUARY TO ARRANGE YOUR FOLLOW UP APPOINTMENT.**  If you have any questions or concerns before your next appointment please send [place] a message through [place] or call our office at [phone].    TO LEAVE A MESSAGE FOR THE NURSE SELECT OPTION 2, PLEASE LEAVE A MESSAGE INCLUDING: YOUR NAME DATE OF BIRTH CALL BACK NUMBER REASON FOR CALL**this is important as we prioritize the call backs  YOU WILL RECEIVE A CALL BACK THE SAME DAY AS LONG AS YOU CALL BEFORE 4:00 PM  At the [facility], you and your health needs are our priority. As  part of our continuing mission to provide you with exceptional heart care, we have created designated Provider Care Teams. These Care Teams include your primary Cardiologist (physician) and Advanced Practice Providers (APPs- Physician Assistants and Nurse Practitioners) who all work together to provide you with the care you need, when you need it.   You may see any of the following providers on your designated Care Team at your next follow up: Dr Arvilla Meres Dr Marca Ancona Dr. Dorthula Nettles Dr. Clearnce Hasten Amy Filbert Schilder, NP Robbie Lis, [place] [facility] [place], [place] Brynda Peon, NP [place] Lee, NP Karle Plumber, PharmD   Please be sure to bring in all your medications bottles to every appointment.    Thank you for choosing [facility]

## 2023-03-09 NOTE — Telephone Encounter (Signed)
Pt requested a sooner appt. Pt offered an appt for tomorrow but states she has another appt tomorrow. Pt scheduled to see Hyacinth Meeker PA 03/21/23 at 1:30pm. Pt aware of appt.

## 2023-03-10 NOTE — Progress Notes (Addendum)
PCP: Chilton Greathouse, MD Cardiology: Dr. Duke Salvia HF Cardiology: Dr. Shirlee Latch  Chief Complaint: CHF  89 y.o. with history of paroxysmal atrial fibrillation, sick sinus syndrome s/p Medtronic PPM, and chronic diastolic CHF was referred by Dr. Duke Salvia for evaluation of CHF.  Patient has a long history of atrial fibrillation.  She had an ablation in 2017.  She was on amiodarone for rhythm control initially, but developed elevated LFTs so this was ultimately stopped.  Since 2022, she has been on flecainide with no problems. She has sick sinus syndrome and is pacemaker dependent.  Patient had a stress Cardiolite in 8/22 that showed no ischemia/infarction.    Most recent echo in 7/24 showed EF 55-60%, mild LVH, normal RV, moderate MR, severe biatrial enlargement, moderate AI, severe TR.   Patient returns for followup of diastolic CHF/tricuspid regurgitation.  Weight is stable.  She generally takes bumetanide 2 mg bid, but took extra bumetanide a couple of times this week because her weight was up.  She says that she ate several times in the dining room at [facility], and the food is higher in sodium than she normally gets. She is short of breath after walking about 100 yards, mildly worse this week.  No lightheadedness.  No orthopnea/PND.  No chest pain.  At last appointment, I had started her on low dose spironolactone.  She stopped this because she felt like it triggered atrial fibrillation after she started taking it.   Medtronic device interrogation: rare atrial fibrillation, >99% RV pacing  ECG (personally reviewed): A-V sequential pacing with paced QTc 550 msec  Labs (11/24): K 3.8, creatinine 1.08, LDL 56 Labs (12/24): BNP 359, K 3.8, creatinine 1.23  PMH: 1. Atrial fibrillation: Paroxysmal.   - AF ablation in 5/17.  - Elevated LFTs on amiodarone, stopped.  2. CAD: Coronary calcification noted on imaging.  - Cardiolite in 8/22 with no ischemia/infarction 3. Sick sinus syndrome: Medtronic  PPM.  4. HTN 5. H/o atrial tachycardia 6. Hypothyroidism 7. Chronic diastolic CHF: Echo in 7/24 with EF 55-60%, mild LVH, normal RV, moderate MR, severe biatrial enlargement, moderate AI, severe TR.   SH: Widowed, lives at [facility].  Occasional ETOH.  No smoking.   Family History  Problem Relation Age of Onset   Hypertension Mother    Congestive Heart Failure Mother    Dementia Mother    Heart attack Father 2       MI cause of death   Diabetes Paternal Grandfather    CAD Brother        2 stents   Lung cancer Son    Leukemia Daughter    Colon cancer Neg Hx    Kidney disease Neg Hx    Liver disease Neg Hx    Esophageal cancer Neg Hx    Stroke Neg Hx    Rectal cancer Neg Hx    Stomach cancer Neg Hx    Current Outpatient Medications  Medication Sig Dispense Refill   acetaminophen (TYLENOL) 500 MG tablet Take 500-1,000 mg by mouth as needed for moderate pain (pain score 4-6) or headache.     Artificial Tear Ointment (DRY EYES OP) Place 1 drop into both eyes daily as needed (Dry eye).     Ascorbic Acid (VITAMIN C) 1000 MG tablet Take 1,000 mg by mouth daily.       bumetanide (BUMEX) 2 MG tablet Take 1 tablet (2 mg total) by mouth 2 (two) times daily. Ok to use an extra 1/2 tablet as needed for  swelling     calcium carbonate (OSCAL) 1500 (600 Ca) MG TABS tablet Take 1 tablet by mouth daily.     cholecalciferol (VITAMIN D) 1000 UNITS tablet Take 1,000 Units by mouth daily.       Coenzyme Q10 (CO Q-10 PO) Take 1 tablet by mouth daily.     cromolyn (OPTICROM) 4 % ophthalmic solution Place 1 drop into both eyes 4 (four) times daily. 10 mL 11   dapagliflozin propanediol (FARXIGA) 10 MG TABS tablet Take 1 tablet (10 mg total) by mouth daily before breakfast. 30 tablet 5   diltiazem (CARDIZEM CD) 360 MG 24 hr capsule Take 1 capsule (360 mg total) by mouth daily. 90 capsule 2   diltiazem (CARDIZEM) 30 MG tablet Take 1/2 to 1 tablet every 4 hours AS NEEDED for heart rate >100 as long as  top blood pressure >100. 45 tablet 3   famotidine (PEPCID) 20 MG tablet Take 20 mg by mouth daily.     flecainide (TAMBOCOR) 50 MG tablet Take 1 tablet (50 mg total) by mouth 2 (two) times daily. 60 tablet 11   levothyroxine (SYNTHROID) 50 MCG tablet Take 1 tablet in the morning on an empty stomach by mouth once a week for 90 days 36 tablet 3   levothyroxine (SYNTHROID) 75 MCG tablet TAKE 75 MCG BY MOUTH FOR 6 DAYS A WEEK (TAKE 50 MCG THE REMAINING 1 DAY OF THE WEEK). Oral 90 days 78 tablet 3   methylcellulose (CITRUCEL) oral powder Take 2 packets by mouth daily.     metoprolol succinate (TOPROL XL) 50 MG 24 hr tablet Take 1.5 tablets (75 mg total) by mouth 2 (two) times daily. 270 tablet 3   Multiple Vitamins-Minerals (ICAPS AREDS 2 PO) Take 1 capsule by mouth daily.      Omega-3 Fatty Acids (FISH OIL) 1200 MG CAPS Take 1,200 mg by mouth daily. Mega Red.     polyethylene glycol (MIRALAX / GLYCOLAX) 17 g packet Take 17 g by mouth as needed.     pravastatin (PRAVACHOL) 10 MG tablet Take 1 tablet (10 mg total) by mouth daily. 90 tablet 2   Probiotic Product (ALIGN PO) Take 1 capsule by mouth daily.     Rivaroxaban (XARELTO) 15 MG TABS tablet Take 1 tablet (15 mg total) by mouth daily with supper. 90 tablet 2   senna-docusate (SENOKOT S) 8.6-50 MG tablet Take 1 tablet by mouth as needed for mild constipation.     sodium chloride (OCEAN) 0.65 % SOLN nasal spray Place 1 spray into both nostrils daily.     Zinc 50 MG CAPS Take 50 mg by mouth daily.      No current facility-administered medications for this encounter.   BP 126/66   Pulse 77   Wt 53.2 kg (117 lb 3.2 oz)   SpO2 93%   BMI 22.51 kg/m  General: NAD Neck: JVP 8-9 cm with prominent CV waves, no thyromegaly or thyroid nodule.  Lungs: Clear to auscultation bilaterally with normal respiratory effort. CV: Nondisplaced PMI.  Heart regular S1/S2, no S3/S4, 2/6 HSM LLSB.  No peripheral edema.  No carotid bruit.  Normal pedal pulses.   Abdomen: Soft, nontender, no hepatosplenomegaly, no distention.  Skin: Intact without lesions or rashes.  Neurologic: Alert and oriented x 3.  Psych: Normal affect. Extremities: No clubbing or cyanosis.  HEENT: Normal.   Assessment/Plan: 1. Chronic diastolic CHF: Echo in 7/24 showed EF 55-60%, mild LVH, normal RV, moderate MR, severe biatrial enlargement, moderate AI,  severe TR.  Her CHF may be due to long-standing atriopathy with dilation of the atria and development of severe TR as well as diastolic LV dysfunction with moderate presumably pulmonary venous hypertension. She has some JVD but also has severe TR.  Weight up about 1 lb compared to prior.  NYHA class II symptoms.  Increased dietary sodium recently.  - Increase bumetanide to 3 mg bid x 2 doses then back to 2 mg bid. BMET/BNP today.  - Continue Farxiga 10 mg daily.  - She stopped spironolactone because she felt like it triggered AF.  I will leave her off it for now.   - Repeat echo at followup in 3 months to reassess RV and TR.  2. Tricuspid regurgitation: Severe on echo from 7/24. Suspect atrial functional TR with severe right atrial enlargement in setting of atrial fibrillation.  - She may be a candidate for Triclip.  However, with relatively normal-appearing RV, think that it would be reasonable to continue medical management for now.  3. Atrial fibrillation: Paroxysmal.  She is in NSR today.  I am a little leery about flecainide use in a patient with structural heart disease, but she has been on it for 2 years with no problems.  Rare short AF runs on device interrogation.  - Continue flecainide 50 mg bid.  - Continue current Toprol XL and diltiazem CD.  - Continue Xarelto 15 mg daily.  4. Sick sinus syndrome: Has MDT PPM.   Followup in 3 months with echo.   I spent 32 minutes reviewing records, interviewing/examining patient, and managing orders.    Kathryn Gibson 03/10/2023

## 2023-03-17 ENCOUNTER — Encounter (HOSPITAL_BASED_OUTPATIENT_CLINIC_OR_DEPARTMENT_OTHER): Payer: Self-pay

## 2023-03-17 ENCOUNTER — Other Ambulatory Visit (HOSPITAL_BASED_OUTPATIENT_CLINIC_OR_DEPARTMENT_OTHER): Payer: Self-pay

## 2023-03-18 NOTE — Progress Notes (Signed)
 Remote pacemaker transmission.

## 2023-03-21 ENCOUNTER — Telehealth: Payer: Self-pay | Admitting: Internal Medicine

## 2023-03-21 ENCOUNTER — Ambulatory Visit: Payer: Medicare Other | Admitting: Physician Assistant

## 2023-03-21 NOTE — Telephone Encounter (Signed)
 Patient called and stated that she would like to know if she can get an earlier appointment either with Dr. Elvin Hammer or any APP's. Patient stated that she has schedule an appointment for 03/18 but does not want to wait another month to be seen. Patient is requesting a call back. Please advise.

## 2023-03-22 NOTE — Telephone Encounter (Signed)
Pt scheduled to see Doug Sou PA 03/30/23 at 1:30pm. Please notify pt of appt, appt in March cancelled.

## 2023-03-24 ENCOUNTER — Other Ambulatory Visit (HOSPITAL_BASED_OUTPATIENT_CLINIC_OR_DEPARTMENT_OTHER): Payer: Self-pay

## 2023-03-28 ENCOUNTER — Ambulatory Visit (HOSPITAL_BASED_OUTPATIENT_CLINIC_OR_DEPARTMENT_OTHER): Payer: Medicare Other | Admitting: Family

## 2023-03-30 ENCOUNTER — Ambulatory Visit: Payer: Medicare Other | Admitting: Gastroenterology

## 2023-04-07 ENCOUNTER — Telehealth: Payer: Self-pay

## 2023-04-07 ENCOUNTER — Encounter: Payer: Self-pay | Admitting: Gastroenterology

## 2023-04-07 ENCOUNTER — Ambulatory Visit: Payer: Medicare Other | Admitting: Gastroenterology

## 2023-04-07 ENCOUNTER — Other Ambulatory Visit (HOSPITAL_BASED_OUTPATIENT_CLINIC_OR_DEPARTMENT_OTHER): Payer: Self-pay

## 2023-04-07 VITALS — BP 106/70 | HR 60 | Ht 60.5 in | Wt 117.0 lb

## 2023-04-07 DIAGNOSIS — R194 Change in bowel habit: Secondary | ICD-10-CM | POA: Diagnosis not present

## 2023-04-07 DIAGNOSIS — R140 Abdominal distension (gaseous): Secondary | ICD-10-CM | POA: Diagnosis not present

## 2023-04-07 DIAGNOSIS — K589 Irritable bowel syndrome without diarrhea: Secondary | ICD-10-CM

## 2023-04-07 MED ORDER — RIFAXIMIN 550 MG PO TABS
550.0000 mg | ORAL_TABLET | Freq: Three times a day (TID) | ORAL | 0 refills | Status: DC
  Filled 2023-04-07: qty 42, 14d supply, fill #0

## 2023-04-07 NOTE — Telephone Encounter (Signed)
 Pharmacy Patient Advocate Encounter   Received notification from CoverMyMeds that prior authorization for Xifaxan 550 mg tablets is required/requested.   Insurance verification completed.   The patient is insured through [facility] .   Per test claim: PA required; PA submitted to above mentioned insurance via CoverMyMeds Key/confirmation #/EOC [id number] Status is pending

## 2023-04-07 NOTE — Patient Instructions (Signed)
 We have sent the following medications to your pharmacy for you to pick up at your convenience: Xifixan 550 mg three times daily for 14 days  _______________________________________________________  If your blood pressure at your visit was 140/90 or greater, please contact your primary care physician to follow up on this.  _______________________________________________________  If you are age 88 or older, your body mass index should be between 23-30. Your Body mass index is 22.47 kg/m. If this is out of the aforementioned range listed, please consider follow up with your Primary Care Provider.  If you are age 50 or younger, your body mass index should be between 19-25. Your Body mass index is 22.47 kg/m. If this is out of the aformentioned range listed, please consider follow up with your Primary Care Provider.   ________________________________________________________  The [place] [facility] providers would like to encourage you to use [facility] to communicate with providers for non-urgent requests or questions.  Due to long hold times on the telephone, sending your provider a message by [facility] may be a faster and more efficient way to get a response.  Please allow 48 business hours for a response.  Please remember that this is for non-urgent requests.  _______________________________________________________

## 2023-04-07 NOTE — Progress Notes (Signed)
 04/07/2023 Kathryn Gibson [id number] Dec 22, 1933   HISTORY OF PRESENT ILLNESS: This is an 89 year old female who is a patient of Dr. Lamar Sprinkles.  She has past medical history of A-fib, bradycardia status post pacemaker, IBS C, etc.  She was last seen here in May 2024 by another one of our PAs, [employer], PA-C.  She is here today with complaints of change in bowel habits.  She takes MiraLAX daily and uses Citrucel daily to help with her bowel movements as she has had chronic issues with constipation.  She says about 6 to 8 weeks ago things kind of changed and she is now having frequent stools, all throughout the day.  They are not diarrhea as they are formed, but just seem to be frequent.  She has had a lot of gas and bloating.  She has tried Beano and that has not helped.  She also reports a lot of noise in her gut, which she reported at her last visit as well.  Her weight is stable.  She denies any abdominal pain.  No rectal bleeding.  Last colonoscopy April 2022 with only diverticulosis in the transverse and left colon and a mild rectal stricture.   Past Medical History:  Diagnosis Date   Arthritis    Atrial fibrillation (HCC)    Atypical chest pain 09/22/2020   Blood transfusion without reported diagnosis    2017   Bradycardia    s/p PPM   CAD in native artery 03/26/2021   Cataract    Chronic diastolic heart failure (HCC) 03/26/2021   Colonic polyp    adenomatous   Constipation    Diverticulosis    Hemorrhoids    Hyperlipidemia    Hypertension    Hypothyroidism    Irritable bowel syndrome (IBS)    Lower extremity edema 09/22/2020   Osteoporosis    osteopenia   Pacemaker 2010   UTI (urinary tract infection)    Past Surgical History:  Procedure Laterality Date   BREAST CYST ASPIRATION Left 1980s   COLONOSCOPY     ELECTROPHYSIOLOGIC STUDY N/A 07/01/2015   Procedure: Atrial Fibrillation Ablation;  Surgeon: Hillis Range, MD;  Location: [facility] INVASIVE CV LAB;  Service:  Cardiovascular;  Laterality: N/A;   EYE SURGERY     heart ablation     for atrial fib   HEMATOMA EVACUATION Right 07/15/2014   Procedure: EVACUATION Right Groin Hematoma;  Surgeon: Larina Earthly, MD;  Location: [facility] OR;  Service: Vascular;  Laterality: Right;   NASAL ENDOSCOPY WITH EPISTAXIS CONTROL N/A 07/14/2014   Procedure: NASAL ENDOSCOPY WITH EPISTAXIS CONTROL;  Surgeon: Melvenia Beam, MD;  Location: [facility] OR;  Service: ENT;  Laterality: N/A;   NASAL ENDOSCOPY WITH EPISTAXIS CONTROL Bilateral 07/15/2014   Procedure: NASAL ENDOSCOPY WITH EPISTAXIS CONTROL;  Surgeon: Melvenia Beam, MD;  Location: [facility] OR;  Service: ENT;  Laterality: Bilateral;   PACEMAKER INSERTION  2010   implanted by Dr Deborah Chalk (MDT)   PPM GENERATOR CHANGEOUT N/A 08/16/2019   Procedure: PPM GENERATOR CHANGEOUT;  Surgeon: Hillis Range, MD;  Location: [facility] INVASIVE CV LAB;  Service: Cardiovascular;  Laterality: N/A;   RADIOLOGY WITH ANESTHESIA N/A 07/14/2014   Procedure: RADIOLOGY WITH ANESTHESIA;  Surgeon: Medication Radiologist, MD;  Location: [facility] OR;  Service: Radiology;  Laterality: N/A;   TEE WITHOUT CARDIOVERSION N/A 07/01/2015   Procedure: TRANSESOPHAGEAL ECHOCARDIOGRAM (TEE);  Surgeon: Quintella Reichert, MD;  Location: [facility] ENDOSCOPY;  Service: Cardiovascular;  Laterality: N/A;    reports that she  quit smoking about 43 years ago. Her smoking use included cigarettes. She has never used smokeless tobacco. She reports current alcohol use of about 7.0 standard drinks of alcohol per week. She reports that she does not use drugs. family history includes CAD in her brother; Congestive Heart Failure in her mother; Dementia in her mother; Diabetes in her paternal grandfather; Heart attack (age of onset: 88) in her father; Hypertension in her mother; Leukemia in her daughter; Lung cancer in her son. Allergies  Allergen Reactions   Trazodone And Nefazodone     "twitching" and trembling      Outpatient Encounter Medications as of 04/07/2023   Medication Sig   acetaminophen (TYLENOL) 500 MG tablet Take 500-1,000 mg by mouth as needed for moderate pain (pain score 4-6) or headache.   Artificial Tear Ointment (DRY EYES OP) Place 1 drop into both eyes daily as needed (Dry eye).   Ascorbic Acid (VITAMIN C) 1000 MG tablet Take 1,000 mg by mouth daily.     bumetanide (BUMEX) 2 MG tablet Take 1 tablet (2 mg total) by mouth 2 (two) times daily. Ok to use an extra 1/2 tablet as needed for swelling   calcium carbonate (OSCAL) 1500 (600 Ca) MG TABS tablet Take 1 tablet by mouth daily.   cholecalciferol (VITAMIN D) 1000 UNITS tablet Take 1,000 Units by mouth daily.     Coenzyme Q10 (CO Q-10 PO) Take 1 tablet by mouth daily.   cromolyn (OPTICROM) 4 % ophthalmic solution Place 1 drop into both eyes 4 (four) times daily.   dapagliflozin propanediol (FARXIGA) 10 MG TABS tablet Take 1 tablet (10 mg total) by mouth daily before breakfast.   diltiazem (CARDIZEM CD) 360 MG 24 hr capsule Take 1 capsule (360 mg total) by mouth daily.   diltiazem (CARDIZEM) 30 MG tablet Take 1/2 to 1 tablet every 4 hours AS NEEDED for heart rate >100 as long as top blood pressure >100.   famotidine (PEPCID) 20 MG tablet Take 20 mg by mouth daily.   flecainide (TAMBOCOR) 50 MG tablet Take 1 tablet (50 mg total) by mouth 2 (two) times daily.   levothyroxine (SYNTHROID) 50 MCG tablet Take 1 tablet in the morning on an empty stomach by mouth once a week for 90 days   levothyroxine (SYNTHROID) 75 MCG tablet TAKE 75 MCG BY MOUTH FOR 6 DAYS A WEEK (TAKE 50 MCG THE REMAINING 1 DAY OF THE WEEK). Oral 90 days   methylcellulose (CITRUCEL) oral powder Take 2 packets by mouth daily.   metoprolol succinate (TOPROL XL) 50 MG 24 hr tablet Take 1.5 tablets (75 mg total) by mouth 2 (two) times daily.   Multiple Vitamins-Minerals (ICAPS AREDS 2 PO) Take 1 capsule by mouth daily.    Omega-3 Fatty Acids (FISH OIL) 1200 MG CAPS Take 1,200 mg by mouth daily. Mega Red.   polyethylene glycol  (MIRALAX / GLYCOLAX) 17 g packet Take 17 g by mouth as needed.   pravastatin (PRAVACHOL) 10 MG tablet Take 1 tablet (10 mg total) by mouth daily.   Probiotic Product (ALIGN PO) Take 1 capsule by mouth daily.   Rivaroxaban (XARELTO) 15 MG TABS tablet Take 1 tablet (15 mg total) by mouth daily with supper.   senna-docusate (SENOKOT S) 8.6-50 MG tablet Take 1 tablet by mouth as needed for mild constipation.   sodium chloride (OCEAN) 0.65 % SOLN nasal spray Place 1 spray into both nostrils daily.   Zinc 50 MG CAPS Take 50 mg by mouth daily.  No facility-administered encounter medications on file as of 04/07/2023.    REVIEW OF SYSTEMS  : All other systems reviewed and negative except where noted in the History of Present Illness.   PHYSICAL EXAM: BP 106/70   Pulse 60   Ht 5' 0.5" (1.537 m)   Wt 117 lb (53.1 kg)   BMI 22.47 kg/m  General: Well developed white female in no acute distress Head: Normocephalic and atraumatic Eyes:  Sclerae anicteric, conjunctiva pink. Ears: Normal auditory acuity Lungs: Clear throughout to auscultation; no W/R/R. Heart: Regular rate and rhythm; no M/R/G. Abdomen: Soft, non-distended.  BS present.  Minimal diffuse TTP. Musculoskeletal: Symmetrical with no gross deformities  Skin: No lesions on visible extremities Extremities: No edema  Neurological: Alert oriented x 4, grossly non-focal Psychological:  Alert and cooperative. Normal mood and affect  ASSESSMENT AND PLAN: *IBS, altered bowel habits, bloating: Underlyingly tends to have constipation and takes MiraLAX and citrucel every day.  For the past 6 to 8 weeks she has had a lot of issues with increase frequency of stool, but not necessarily diarrhea per se.  Seems to be back and forth to the bathroom several times.  Stool are formed, however.  Has a lot of increase in foul-smelling gas and abdominal bloating.  Could have SIBO.  It looks like Xifaxan is preferred Level One on her medication so we will see  if we can get that for her and treat her with 550 mg 3 times daily x 14 days.  She will call and give [place] an update on her symptoms after completing the course of antibiotics.   CC:  Chilton Greathouse, MD

## 2023-04-08 ENCOUNTER — Other Ambulatory Visit (HOSPITAL_COMMUNITY): Payer: Self-pay

## 2023-04-08 ENCOUNTER — Other Ambulatory Visit (HOSPITAL_BASED_OUTPATIENT_CLINIC_OR_DEPARTMENT_OTHER): Payer: Self-pay

## 2023-04-08 NOTE — Progress Notes (Signed)
 Noted.

## 2023-04-08 NOTE — Telephone Encounter (Signed)
 Pharmacy Patient Advocate Encounter  Received notification from [facility] that Prior Authorization for Xifaxan 550mg  tabs   has been APPROVED from 04/07/2023 to 04/06/2024. Unable to obtain price due to refill too soon rejection, last fill date 04/08/2023 next available fill date03/12/2023

## 2023-04-13 DIAGNOSIS — K080 Exfoliation of teeth due to systemic causes: Secondary | ICD-10-CM | POA: Diagnosis not present

## 2023-04-18 ENCOUNTER — Ambulatory Visit: Payer: Medicare Other | Admitting: Physician Assistant

## 2023-04-26 ENCOUNTER — Ambulatory Visit: Payer: Medicare Other | Admitting: Gastroenterology

## 2023-05-05 ENCOUNTER — Other Ambulatory Visit: Payer: Self-pay | Admitting: Cardiovascular Disease

## 2023-05-05 ENCOUNTER — Other Ambulatory Visit (HOSPITAL_BASED_OUTPATIENT_CLINIC_OR_DEPARTMENT_OTHER): Payer: Self-pay | Admitting: Cardiovascular Disease

## 2023-05-05 ENCOUNTER — Other Ambulatory Visit (HOSPITAL_BASED_OUTPATIENT_CLINIC_OR_DEPARTMENT_OTHER): Payer: Self-pay

## 2023-05-06 ENCOUNTER — Other Ambulatory Visit (HOSPITAL_BASED_OUTPATIENT_CLINIC_OR_DEPARTMENT_OTHER): Payer: Self-pay

## 2023-05-06 ENCOUNTER — Encounter (HOSPITAL_BASED_OUTPATIENT_CLINIC_OR_DEPARTMENT_OTHER): Payer: Self-pay

## 2023-05-06 MED ORDER — RIVAROXABAN 15 MG PO TABS
15.0000 mg | ORAL_TABLET | Freq: Every day | ORAL | 2 refills | Status: DC
  Filled 2023-05-06: qty 90, 90d supply, fill #0
  Filled 2023-08-11: qty 90, 90d supply, fill #1
  Filled 2023-11-09: qty 90, 90d supply, fill #2

## 2023-05-06 NOTE — Telephone Encounter (Signed)
 Pt last saw Dr Shirlee Latch 03/09/23, last labs 03/09/23 Creat 1.23, age 89, weight 53.1kg, CrCl 27.01, based on CrCl pt is on appropriate dosage of Xarelto 15mg  every day for afib.  Will refill rx.

## 2023-05-07 ENCOUNTER — Encounter (HOSPITAL_BASED_OUTPATIENT_CLINIC_OR_DEPARTMENT_OTHER): Payer: Self-pay | Admitting: Cardiovascular Disease

## 2023-05-07 ENCOUNTER — Other Ambulatory Visit (HOSPITAL_BASED_OUTPATIENT_CLINIC_OR_DEPARTMENT_OTHER): Payer: Self-pay

## 2023-05-09 ENCOUNTER — Other Ambulatory Visit: Payer: Self-pay

## 2023-05-09 ENCOUNTER — Other Ambulatory Visit (HOSPITAL_BASED_OUTPATIENT_CLINIC_OR_DEPARTMENT_OTHER): Payer: Self-pay

## 2023-05-09 MED ORDER — BUMETANIDE 2 MG PO TABS
2.0000 mg | ORAL_TABLET | Freq: Two times a day (BID) | ORAL | 3 refills | Status: DC
  Filled 2023-05-09: qty 180, 90d supply, fill #0

## 2023-05-09 NOTE — Addendum Note (Signed)
 Addended by: Marlene Lard on: 05/09/2023 09:56 AM   Modules accepted: Orders

## 2023-05-10 ENCOUNTER — Other Ambulatory Visit (HOSPITAL_BASED_OUTPATIENT_CLINIC_OR_DEPARTMENT_OTHER): Payer: Self-pay

## 2023-05-12 ENCOUNTER — Other Ambulatory Visit (HOSPITAL_BASED_OUTPATIENT_CLINIC_OR_DEPARTMENT_OTHER): Payer: Self-pay | Admitting: Cardiovascular Disease

## 2023-05-12 ENCOUNTER — Other Ambulatory Visit (HOSPITAL_BASED_OUTPATIENT_CLINIC_OR_DEPARTMENT_OTHER): Payer: Self-pay

## 2023-05-12 DIAGNOSIS — I480 Paroxysmal atrial fibrillation: Secondary | ICD-10-CM

## 2023-05-12 MED ORDER — DILTIAZEM HCL ER COATED BEADS 360 MG PO CP24
360.0000 mg | ORAL_CAPSULE | Freq: Every day | ORAL | 2 refills | Status: DC
  Filled 2023-05-12: qty 90, 90d supply, fill #0
  Filled 2023-08-11: qty 90, 90d supply, fill #1
  Filled 2023-10-28: qty 90, 90d supply, fill #2

## 2023-05-16 ENCOUNTER — Ambulatory Visit (INDEPENDENT_AMBULATORY_CARE_PROVIDER_SITE_OTHER): Payer: Self-pay

## 2023-05-16 DIAGNOSIS — I480 Paroxysmal atrial fibrillation: Secondary | ICD-10-CM | POA: Diagnosis not present

## 2023-05-16 DIAGNOSIS — I495 Sick sinus syndrome: Secondary | ICD-10-CM

## 2023-05-16 LAB — CUP PACEART REMOTE DEVICE CHECK
Battery Remaining Longevity: 81 mo
Battery Voltage: 2.98 V
Brady Statistic AP VP Percent: 94.05 %
Brady Statistic AP VS Percent: 0.66 %
Brady Statistic AS VP Percent: 2.72 %
Brady Statistic AS VS Percent: 2.58 %
Brady Statistic RA Percent Paced: 95.48 %
Brady Statistic RV Percent Paced: 96.62 %
Date Time Interrogation Session: 20250406202555
Implantable Lead Connection Status: 753985
Implantable Lead Connection Status: 753985
Implantable Lead Implant Date: 20101109
Implantable Lead Implant Date: 20101109
Implantable Lead Location: 753859
Implantable Lead Location: 753860
Implantable Lead Model: 4469
Implantable Lead Model: 4470
Implantable Lead Serial Number: 535369
Implantable Lead Serial Number: 657967
Implantable Pulse Generator Implant Date: 20210708
Lead Channel Impedance Value: 247 Ohm
Lead Channel Impedance Value: 380 Ohm
Lead Channel Impedance Value: 380 Ohm
Lead Channel Impedance Value: 418 Ohm
Lead Channel Pacing Threshold Amplitude: 0.5 V
Lead Channel Pacing Threshold Amplitude: 1 V
Lead Channel Pacing Threshold Pulse Width: 0.4 ms
Lead Channel Pacing Threshold Pulse Width: 0.4 ms
Lead Channel Sensing Intrinsic Amplitude: 0.125 mV
Lead Channel Sensing Intrinsic Amplitude: 0.125 mV
Lead Channel Sensing Intrinsic Amplitude: 3.25 mV
Lead Channel Sensing Intrinsic Amplitude: 3.25 mV
Lead Channel Setting Pacing Amplitude: 2.5 V
Lead Channel Setting Pacing Amplitude: 2.5 V
Lead Channel Setting Pacing Pulse Width: 0.4 ms
Lead Channel Setting Sensing Sensitivity: 1.2 mV
Zone Setting Status: 755011
Zone Setting Status: 755011

## 2023-05-22 ENCOUNTER — Encounter: Payer: Self-pay | Admitting: Cardiovascular Disease

## 2023-05-23 ENCOUNTER — Other Ambulatory Visit (HOSPITAL_BASED_OUTPATIENT_CLINIC_OR_DEPARTMENT_OTHER): Payer: Self-pay

## 2023-05-25 DIAGNOSIS — M8580 Other specified disorders of bone density and structure, unspecified site: Secondary | ICD-10-CM | POA: Diagnosis not present

## 2023-05-25 DIAGNOSIS — E039 Hypothyroidism, unspecified: Secondary | ICD-10-CM | POA: Diagnosis not present

## 2023-05-25 DIAGNOSIS — E785 Hyperlipidemia, unspecified: Secondary | ICD-10-CM | POA: Diagnosis not present

## 2023-05-25 DIAGNOSIS — D649 Anemia, unspecified: Secondary | ICD-10-CM | POA: Diagnosis not present

## 2023-05-31 ENCOUNTER — Other Ambulatory Visit (HOSPITAL_BASED_OUTPATIENT_CLINIC_OR_DEPARTMENT_OTHER): Payer: Self-pay

## 2023-05-31 ENCOUNTER — Other Ambulatory Visit: Payer: Self-pay | Admitting: Cardiovascular Disease

## 2023-05-31 ENCOUNTER — Other Ambulatory Visit: Payer: Self-pay

## 2023-05-31 MED ORDER — FLECAINIDE ACETATE 50 MG PO TABS
50.0000 mg | ORAL_TABLET | Freq: Two times a day (BID) | ORAL | 9 refills | Status: AC
  Filled 2023-05-31: qty 60, 30d supply, fill #0
  Filled 2023-07-04: qty 60, 30d supply, fill #1
  Filled 2023-07-31: qty 60, 30d supply, fill #2
  Filled 2023-08-30: qty 60, 30d supply, fill #3
  Filled 2023-09-29: qty 60, 30d supply, fill #4
  Filled 2023-10-28: qty 60, 30d supply, fill #5
  Filled 2023-12-08 (×2): qty 60, 30d supply, fill #6
  Filled 2023-12-19 – 2024-01-09 (×2): qty 60, 30d supply, fill #7
  Filled 2024-02-06: qty 60, 30d supply, fill #8
  Filled 2024-03-08: qty 60, 30d supply, fill #9

## 2023-06-02 ENCOUNTER — Encounter (HOSPITAL_BASED_OUTPATIENT_CLINIC_OR_DEPARTMENT_OTHER): Payer: Self-pay | Admitting: Cardiovascular Disease

## 2023-06-02 ENCOUNTER — Other Ambulatory Visit (HOSPITAL_BASED_OUTPATIENT_CLINIC_OR_DEPARTMENT_OTHER): Payer: Self-pay

## 2023-06-02 ENCOUNTER — Encounter (HOSPITAL_BASED_OUTPATIENT_CLINIC_OR_DEPARTMENT_OTHER): Payer: Self-pay | Admitting: *Deleted

## 2023-06-02 ENCOUNTER — Ambulatory Visit (HOSPITAL_BASED_OUTPATIENT_CLINIC_OR_DEPARTMENT_OTHER): Payer: Medicare Other | Admitting: Cardiovascular Disease

## 2023-06-02 VITALS — BP 126/64 | HR 62 | Resp 16 | Ht 60.0 in | Wt 119.0 lb

## 2023-06-02 DIAGNOSIS — I495 Sick sinus syndrome: Secondary | ICD-10-CM

## 2023-06-02 DIAGNOSIS — I10 Essential (primary) hypertension: Secondary | ICD-10-CM

## 2023-06-02 DIAGNOSIS — E7800 Pure hypercholesterolemia, unspecified: Secondary | ICD-10-CM | POA: Diagnosis not present

## 2023-06-02 DIAGNOSIS — I5032 Chronic diastolic (congestive) heart failure: Secondary | ICD-10-CM

## 2023-06-02 DIAGNOSIS — I2510 Atherosclerotic heart disease of native coronary artery without angina pectoris: Secondary | ICD-10-CM

## 2023-06-02 DIAGNOSIS — I493 Ventricular premature depolarization: Secondary | ICD-10-CM

## 2023-06-02 DIAGNOSIS — I482 Chronic atrial fibrillation, unspecified: Secondary | ICD-10-CM

## 2023-06-02 DIAGNOSIS — I071 Rheumatic tricuspid insufficiency: Secondary | ICD-10-CM | POA: Diagnosis not present

## 2023-06-02 HISTORY — DX: Rheumatic tricuspid insufficiency: I07.1

## 2023-06-02 NOTE — Progress Notes (Signed)
 Cardiology Office Note:  .   Date:  06/02/2023  ID:  Romualdo Cobble, DOB 01-18-1934, MRN [id number] PCP: Lonzie Robins, MD  [facility] Providers Cardiologist:  Maudine Sos, MD Electrophysiologist:  Efraim Grange, MD    History of Present Illness: .   Kathryn Gibson is a 89 y.o. female  with a hx of atrial fibrillation s/p ablation, SSS s/p PPM, severe tricuspid regurgitation, moderate mitral regurgitation, aortic atherosclerosis, asymptomatic coronary calcifications, recurrent epistaxis, and hypertension, who presents for follow-up.  Kathryn Gibson was previously a patient of Dr. Santiago Cuff.  She underwent ablation for atrial fibrillation on 07/01/15.  She had an episode of atrial fibrillation 12/2015.  Amiodarone  was previously increased due to atrial tachycardia.  This was discontinued due to elevated LFTs which subsequently normalized.  She saw Dr. Chancy Comber 07/2018 and reported exertional dyspnea.  Dr. Chancy Comber offered stress testing which she wanted to think about.  Her device was interrogated 01/08/19 and was nearing ERI.  There were no episodes of atrial fibrillation noted.   Her generator was changed out 08/2019.  Afterward she reported feeling poorly and felt like her heart was racing, though is in sinus rhythm in the 90s.  Metoprolol  was increased.  She saw Dr. Nunzio Belch on 05/2020 and was doing well.  Her A. fib burden was at 3.4%.  On her remote check 08/2020 it was 10.6%. She followed up with EP on 11/2020 and they did not make any changes. She had some chest tightness that was thought to be due to her thoracic spine fracture. She had a nuclear stress test 09/2020 that was normal.   She reported some LE edema. BNP was 310. Lasix  was increased to 40 mg. She followed up with EP 05/2021 and was doing well with minimal Afib. She was previously on 40 mg of Lasix  every day and felt better.  However she had mild renal dysfunction.  We changed her Lasix  to 40 mg on Monday, Wednesday, Friday,  and continued with 20 mg on the other days. At her follow-up 12/2021 with EP she was doing well.    At her appointment 04/2022 she was recovering from COVID-19.  She was seen 06/2022 after multiple MyChart messages reporting shortness of breath and edema.  She took increasing doses of torsemide  with symptomatic improvement.  She saw Neomi Banks, NP 06/2022 and was doing better.  She is taking torsemide  20 mg with an additional 20 mg as needed.  [name] recommended that she start Farxiga .  BNP has been consistently in the 500s despite titrating torsemide .  GFR has been between 41 and 52.  Echo 08/2022 revealed increasing tricuspid regurgitation, which was now severe.  LVEF was 55-60% with grade 2 diastolic dysfunction.  She had moderate mitral regurgitation.  She was also noted to have aortic atherosclerosis.   At her visit 10/27/22 she was feeling poorly and volume overloaded.  Bumex  was increased to bid and symptoms improved at follow up the following week. She called the office 12/2022 with increasing shortness of breath and was instructed to take 1.5 torsemide  tablets.  She saw Dr. Mitzie Anda in heart failure clinic 12/2022 with concern that her heart failure symptoms may due to her severe TR, diastolic dysfunction, atriopathy and pulmonary venous hypertension.  She had JVD  though her Reds vest was not elevated at 29%.  He added spironolactone  to her Bumex  and Farxiga .  There was consideration for Tri clip.  However given her normal-appearing right ventricle he elected to continue  with medical management.  She saw Dr. Arlester Ladd 12/2022 and afib burdon was <1%. There has been discussion about stopping flecainide  due to underlying structural heart disease, but she declines.   Kathryn Gibson followed up with Dr. Mitzie Anda 02/2023 had discontinued her spironolactone  because she felt that it triggered her atrial fibrillation.  She is scheduled for repeat echocardiogram.    Discussed the use of AI scribe software for clinical  note transcription with the patient, who gave verbal consent to proceed.  History of Present Illness Kathryn Gibson has been experiencing increased shortness of breath recently and has gained about seven pounds since Christmas, which she attributes to increased food intake, particularly sweets. She craves sweets even at breakfast, which is unusual for her. She is concerned about whether her Farxiga  might be causing these cravings.  She also worries it could adversely affect her kidneys.  She has been less active recently, not returning to swimming due to lack of a partner and feeling uncomfortable going alone. She continues to exercise but not as much as before. She is currently on Bumex  for fluid management and Farxiga . She is also on flecainide  and diltiazem  for atrial fibrillation management and has not needed extra doses of diltiazem  recently.  She experiences significant insomnia, sleeping as little as one and a half to four hours per night. She has tried various remedies including chamomile tea, CBD gummies, melatonin, and Tylenol  PM, but these have not been effective. She reports involuntary movements and twitching, which she associates with her previous use of temazepam , a medication she has discontinued. Her sleep pattern is erratic, with some nights being better than others, such as when she had wine at dinner for her birthday.  She forgot to take her afternoon diuretic on Tuesday, which coincided with consuming champagne, leading to some swelling. She is currently able to walk without her walker for short distances but uses it for longer walks. She is concerned about her tricuspid valve and is awaiting an echocardiogram to assess its status.  No recent episodes of atrial fibrillation and her ankles are not swollen. Her cholesterol levels are good.  ROS:  As per HPI  Studies Reviewed: .       Echo 08/2022: 1. Compared with the echo 04/2020, tricuspid regurgitation has increased  from  mild/moderate to severe.   2. Left ventricular ejection fraction, by estimation, is 55 to 60%. The  left ventricle has normal function. The left ventricle has no regional  wall motion abnormalities. There is mild concentric left ventricular  hypertrophy. Left ventricular diastolic  parameters are consistent with Grade II diastolic dysfunction  (pseudonormalization).   3. Right ventricular systolic function is normal. The right ventricular  size is normal. There is moderately elevated pulmonary artery systolic  pressure.   4. Left atrial size was severely dilated.   5. Right atrial size was severely dilated.   6. Mitral valve regurgitant volume 45 mL. The mitral valve is normal in  structure. Moderate mitral valve regurgitation. No evidence of mitral  stenosis.   7. Tricuspid valve regurgitation is severe.   8. The aortic valve is tricuspid. Aortic valve regurgitation is moderate.  No aortic stenosis is present. Aortic regurgitation PHT measures 351 msec.   9. There is mild (Grade II) atheroma plaque involving the ascending  aorta.  10. The inferior vena cava is dilated in size with >50% respiratory  variability, suggesting right atrial pressure of 8 mmHg.   Risk Assessment/Calculations:    CHA2DS2-VASc Score = 5  This indicates a 7.2% annual risk of stroke. The patient's score is based upon: CHF History: 1 HTN History: 1 Diabetes History: 0 Stroke History: 0 Vascular Disease History: 0 Age Score: 2 Gender Score: 1            Physical Exam:   VS:  BP 126/64 (BP Location: Right Arm, Patient Position: Sitting, Cuff Size: Normal)   Pulse 62   Resp 16   Ht 5' (1.524 m)   Wt 119 lb (54 kg)   SpO2 95%   BMI 23.24 kg/m  , BMI Body mass index is 23.24 kg/m. GENERAL:  Well appearing HEENT: Pupils equal round and reactive, fundi not visualized, oral mucosa unremarkable NECK:  JVP 2 cm above clavicle at 45 degrees.  waveform within normal limits, carotid upstroke brisk and  symmetric, no bruits, no thyromegaly LUNGS:  Clear to auscultation bilaterally HEART:  RRR.  PMI not displaced or sustained,S1 and S2 within normal limits, no S3, no S4, no clicks, no rubs, II/VI systolic murmur at the apex ABD:  Flat, positive bowel sounds normal in frequency in pitch, no bruits, no rebound, no guarding, no midline pulsatile mass, no hepatomegaly, no splenomegaly EXT:  2 plus pulses throughout, no edema, no cyanosis no clubbing SKIN:  No rashes no nodules NEURO:  Cranial nerves II through XII grossly intact, motor grossly intact throughout PSYCH:  Cognitively intact, oriented to person place and time   ASSESSMENT AND PLAN: .    Assessment & Plan  # Severe TR:  # PH:  HFpEF:  She is euvolemic.  She has been gaining weight in the setting of celebrating her birthday with dinners and no exercising as regularly.  Volume status is stable.  Continue taking Bumex , Farxiga , and metoprolol .  She did not tolerate spironolactone .  She has been following the heart failure clinic and considering Tri clip.  # Paroxysmal Atrial Fibrillation Atrial fibrillation well-controlled with flecainide  and diltiazem .  She is s/p ablation.  Stopping flecanide has been discussed but she prefers to continue it.  Very low afib burden on her PPM interrogations.  - Continue flecainide  and diltiazem . - Continue Xarelto  - Monitor atrial fibrillation status with Doctor Benedetto Brady.  # Chronic Kidney Disease 3a-b Chronic kidney disease managed with Farxiga , slowing progression and reducing dialysis likelihood. Reassured her about Farxiga 's protective effects. - Continue Farxiga . - Monitor kidney function regularly. - BP is controlled  # Insomnia Chronic insomnia with significant disruption. Temazepam  discontinued due to adverse effects. No current sleep aids. - Discuss insomnia management with primary care physician during upcoming physical. - Consider referral to a sleep specialist if primary care  management is insufficient.            Dispo: f/u in 4 months  Signed, Maudine Sos, MD

## 2023-06-02 NOTE — Patient Instructions (Signed)
 Medication Instructions:   Your physician recommends that you continue on your current medications as directed. Please refer to the Current Medication list given to you today.   *If you need a refill on your cardiac medications before your next appointment, please call your pharmacy*  Lab Work:  None ordered.  If you have labs (blood work) drawn today and your tests are completely normal, you will receive your results only by: MyChart Message (if you have MyChart) OR A paper copy in the mail If you have any lab test that is abnormal or we need to change your treatment, we will call you to review the results.  Testing/Procedures:  None ordered.  Follow-Up: At [facility], you and your health needs are our priority.  As part of our continuing mission to provide you with exceptional heart care, our providers are all part of one team.  This team includes your primary Cardiologist (physician) and Advanced Practice Providers or APPs (Physician Assistants and Nurse Practitioners) who all work together to provide you with the care you need, when you need it.  Your next appointment:   4 month(s)  Provider:   Maudine Sos, MD    We recommend signing up for the patient portal called "MyChart".  Sign up information is provided on this After Visit Summary.  MyChart is used to connect with patients for Virtual Visits (Telemedicine).  Patients are able to view lab/test results, encounter notes, upcoming appointments, etc.  Non-urgent messages can be sent to your provider as well.   To learn more about what you can do with MyChart, go to [email or web address].

## 2023-06-06 ENCOUNTER — Other Ambulatory Visit (HOSPITAL_BASED_OUTPATIENT_CLINIC_OR_DEPARTMENT_OTHER): Payer: Self-pay

## 2023-06-06 DIAGNOSIS — Z0000 Encounter for general adult medical examination without abnormal findings: Secondary | ICD-10-CM | POA: Diagnosis not present

## 2023-06-06 DIAGNOSIS — I110 Hypertensive heart disease with heart failure: Secondary | ICD-10-CM | POA: Diagnosis not present

## 2023-06-06 DIAGNOSIS — Z1331 Encounter for screening for depression: Secondary | ICD-10-CM | POA: Diagnosis not present

## 2023-06-06 DIAGNOSIS — Z1339 Encounter for screening examination for other mental health and behavioral disorders: Secondary | ICD-10-CM | POA: Diagnosis not present

## 2023-06-06 DIAGNOSIS — I10 Essential (primary) hypertension: Secondary | ICD-10-CM | POA: Diagnosis not present

## 2023-06-06 DIAGNOSIS — R82998 Other abnormal findings in urine: Secondary | ICD-10-CM | POA: Diagnosis not present

## 2023-06-07 ENCOUNTER — Other Ambulatory Visit (HOSPITAL_BASED_OUTPATIENT_CLINIC_OR_DEPARTMENT_OTHER): Payer: Self-pay

## 2023-06-07 MED ORDER — AMITRIPTYLINE HCL 10 MG PO TABS
ORAL_TABLET | ORAL | 3 refills | Status: DC
  Filled 2023-06-07: qty 30, 30d supply, fill #0
  Filled 2023-07-04: qty 30, 30d supply, fill #1

## 2023-06-20 ENCOUNTER — Other Ambulatory Visit: Payer: Self-pay | Admitting: Internal Medicine

## 2023-06-20 ENCOUNTER — Encounter (HOSPITAL_COMMUNITY): Payer: Self-pay | Admitting: Cardiology

## 2023-06-20 ENCOUNTER — Ambulatory Visit (HOSPITAL_BASED_OUTPATIENT_CLINIC_OR_DEPARTMENT_OTHER)
Admission: RE | Admit: 2023-06-20 | Discharge: 2023-06-20 | Disposition: A | Discharge status: Home / Self Care | Source: Ambulatory Visit | Attending: Cardiology | Admitting: Cardiology

## 2023-06-20 ENCOUNTER — Ambulatory Visit (HOSPITAL_COMMUNITY)
Admission: RE | Admit: 2023-06-20 | Discharge: 2023-06-20 | Disposition: A | Discharge status: Home / Self Care | Source: Ambulatory Visit | Attending: Cardiology | Admitting: Cardiology

## 2023-06-20 ENCOUNTER — Other Ambulatory Visit (HOSPITAL_BASED_OUTPATIENT_CLINIC_OR_DEPARTMENT_OTHER): Payer: Self-pay

## 2023-06-20 VITALS — BP 130/70 | HR 65 | Wt 119.2 lb

## 2023-06-20 DIAGNOSIS — I2510 Atherosclerotic heart disease of native coronary artery without angina pectoris: Secondary | ICD-10-CM | POA: Insufficient documentation

## 2023-06-20 DIAGNOSIS — I110 Hypertensive heart disease with heart failure: Secondary | ICD-10-CM | POA: Diagnosis not present

## 2023-06-20 DIAGNOSIS — I083 Combined rheumatic disorders of mitral, aortic and tricuspid valves: Secondary | ICD-10-CM | POA: Insufficient documentation

## 2023-06-20 DIAGNOSIS — I480 Paroxysmal atrial fibrillation: Secondary | ICD-10-CM | POA: Insufficient documentation

## 2023-06-20 DIAGNOSIS — Z950 Presence of cardiac pacemaker: Secondary | ICD-10-CM | POA: Insufficient documentation

## 2023-06-20 DIAGNOSIS — Z8249 Family history of ischemic heart disease and other diseases of the circulatory system: Secondary | ICD-10-CM | POA: Insufficient documentation

## 2023-06-20 DIAGNOSIS — I5032 Chronic diastolic (congestive) heart failure: Secondary | ICD-10-CM | POA: Insufficient documentation

## 2023-06-20 DIAGNOSIS — Z79899 Other long term (current) drug therapy: Secondary | ICD-10-CM | POA: Insufficient documentation

## 2023-06-20 DIAGNOSIS — I495 Sick sinus syndrome: Secondary | ICD-10-CM | POA: Diagnosis not present

## 2023-06-20 DIAGNOSIS — Z1231 Encounter for screening mammogram for malignant neoplasm of breast: Secondary | ICD-10-CM

## 2023-06-20 DIAGNOSIS — Z7901 Long term (current) use of anticoagulants: Secondary | ICD-10-CM | POA: Diagnosis not present

## 2023-06-20 LAB — ECHOCARDIOGRAM COMPLETE
Area-P 1/2: 5.27 cm2
MV M vel: 5.13 m/s
MV Peak grad: 105.3 mmHg
P 1/2 time: 370 ms
Radius: 0.4 cm
S' Lateral: 3.1 cm
Single Plane A2C EF: 56.7 %

## 2023-06-20 LAB — BASIC METABOLIC PANEL WITH GFR
Anion gap: 12 (ref 5–15)
BUN: 24 mg/dL — ABNORMAL HIGH (ref 8–23)
CO2: 30 mmol/L (ref 22–32)
Calcium: 10 mg/dL (ref 8.9–10.3)
Chloride: 98 mmol/L (ref 98–111)
Creatinine, Ser: 0.97 mg/dL (ref 0.44–1.00)
GFR, Estimated: 56 mL/min — ABNORMAL LOW (ref >60)
Glucose, Bld: 113 mg/dL — ABNORMAL HIGH (ref 70–99)
Potassium: 3.5 mmol/L (ref 3.5–5.1)
Sodium: 140 mmol/L (ref 135–145)

## 2023-06-20 LAB — BRAIN NATRIURETIC PEPTIDE: B Natriuretic Peptide: 399 pg/mL — ABNORMAL HIGH (ref 0.0–100.0)

## 2023-06-20 MED ORDER — BUMETANIDE 1 MG PO TABS
ORAL_TABLET | ORAL | 6 refills | Status: AC
  Filled 2023-06-20: qty 155, 31d supply, fill #0
  Filled 2023-06-21: qty 65, 13d supply, fill #0
  Filled 2023-08-21: qty 220, 44d supply, fill #1
  Filled 2023-10-09: qty 220, 44d supply, fill #2
  Filled 2023-11-17: qty 220, 44d supply, fill #3
  Filled 2024-01-02: qty 220, 44d supply, fill #4
  Filled 2024-02-18 – 2024-02-21 (×2): qty 220, 44d supply, fill #5

## 2023-06-20 MED ORDER — POTASSIUM CHLORIDE CRYS ER 20 MEQ PO TBCR
20.0000 meq | EXTENDED_RELEASE_TABLET | Freq: Every day | ORAL | 3 refills | Status: DC
  Filled 2023-06-20: qty 90, 90d supply, fill #0

## 2023-06-20 NOTE — Patient Instructions (Signed)
 CHANGE Bumex  to 3 mg every morning and 2 mg every evening.  START Potassium 20 mEq ( 1 Tab) daily.  Labs done today, your results will be available in MyChart, we will contact you for abnormal readings.  Repeat blood work in 10 days at [facility].  Your physician recommends that you schedule a follow-up appointment in: 5 months.  If you have any questions or concerns before your next appointment please send us  a message through [place] or call our office at [phone].    TO LEAVE A MESSAGE FOR THE NURSE SELECT OPTION 2, PLEASE LEAVE A MESSAGE INCLUDING: YOUR NAME DATE OF BIRTH CALL BACK NUMBER REASON FOR CALL**this is important as we prioritize the call backs  YOU WILL RECEIVE A CALL BACK THE SAME DAY AS LONG AS YOU CALL BEFORE 4:00 PM  At the [facility], you and your health needs are our priority. As part of our continuing mission to provide you with exceptional heart care, we have created designated Provider Care Teams. These Care Teams include your primary Cardiologist (physician) and Advanced Practice Providers (APPs- Physician Assistants and Nurse Practitioners) who all work together to provide you with the care you need, when you need it.   You may see any of the following providers on your designated Care Team at your next follow up: Dr Jules Oar Dr Peder Bourdon Dr. Alwin Baars Dr. Arta Lark Amy Marijane Shoulders, NP Ruddy Corral, [facility] [place], [place] Dennise Fitz, NP [place] Lee, NP Shawnee Dellen, NP Luster Salters, PharmD Bevely Brush, PharmD   Please be sure to bring in all your medications bottles to every appointment.    Thank you for choosing [facility]

## 2023-06-20 NOTE — Progress Notes (Signed)
  Echocardiogram 2D Echocardiogram has been performed.  Kathryn Gibson 06/20/2023, 2:50 PM

## 2023-06-21 ENCOUNTER — Other Ambulatory Visit: Payer: Self-pay

## 2023-06-21 ENCOUNTER — Other Ambulatory Visit (HOSPITAL_BASED_OUTPATIENT_CLINIC_OR_DEPARTMENT_OTHER): Payer: Self-pay

## 2023-06-21 NOTE — Progress Notes (Signed)
 PCP: Avva, Ravisankar, MD Cardiology: Dr. Theodis Fiscal HF Cardiology: Dr. Mitzie Anda  Chief Complaint: CHF  89 y.o. with history of paroxysmal atrial fibrillation, sick sinus syndrome s/p Medtronic PPM, and chronic diastolic CHF was referred by Dr. Theodis Fiscal for evaluation of CHF.  Patient has a long history of atrial fibrillation.  She had an ablation in 2017.  She was on amiodarone  for rhythm control initially, but developed elevated LFTs so this was ultimately stopped.  Since 2022, she has been on flecainide  with no problems. She has sick sinus syndrome and is pacemaker dependent.  Patient had a stress Cardiolite in 8/22 that showed no ischemia/infarction.    Echo in 7/24 showed EF 55-60%, mild LVH, normal RV, moderate MR, severe biatrial enlargement, moderate AI, severe TR.    Echo was done today and reviewed, EF 55-60%, mild LVH, mild RV dilation with mildly decreased RV systolic function, moderate MR, moderate-severe TR, PASP 48 mmHg, mild AI, no AS, moderate-severe LAE.   Patient returns for followup of diastolic CHF/tricuspid regurgitation.  Weight is up 2 lbs.  She is short of breath if she walks long distances.  She uses a walker to go longer distances.  No chest pain. No lightheadedness.  She enjoys doing aerobics and walking in a pool, she did this today.    Medtronic device interrogation: rare atrial fibrillation, >99% RV pacing  Labs (11/24): K 3.8, creatinine 1.08, LDL 56 Labs (12/24): BNP 359, K 3.8, creatinine 1.23 Labs (1/25): BNP 554, K 3.5, creatinine 1.23  PMH: 1. Atrial fibrillation: Paroxysmal.   - AF ablation in 5/17.  - Elevated LFTs on amiodarone , stopped.  2. CAD: Coronary calcification noted on imaging.  - Cardiolite in 8/22 with no ischemia/infarction 3. Sick sinus syndrome: Medtronic PPM.  4. HTN 5. H/o atrial tachycardia 6. Hypothyroidism 7. Chronic diastolic CHF: Echo in 7/24 with EF 55-60%, mild LVH, normal RV, moderate MR, severe biatrial enlargement, moderate  AI, severe TR.  - Echo (5/25, my review): EF 55-60%, mild LVH, mild RV dilation with mildly decreased RV systolic function, moderate MR, moderate-severe TR, PASP 48 mmHg, mild AI, no AS, moderate-severe LAE.   SH: Widowed, lives at [facility].  Occasional ETOH.  No smoking.   Family History  Problem Relation Age of Onset   Hypertension Mother    Congestive Heart Failure Mother    Dementia Mother    Heart attack Father 34       MI cause of death   Diabetes Paternal Grandfather    CAD Brother        2 stents   Lung cancer Son    Leukemia Daughter    Colon cancer Neg Hx    Kidney disease Neg Hx    Liver disease Neg Hx    Esophageal cancer Neg Hx    Stroke Neg Hx    Rectal cancer Neg Hx    Stomach cancer Neg Hx    Current Outpatient Medications  Medication Sig Dispense Refill   acetaminophen  (TYLENOL ) 500 MG tablet Take 500-1,000 mg by mouth as needed for moderate pain (pain score 4-6) or headache.     amitriptyline  (ELAVIL ) 10 MG tablet 1 tablet at bedtime Orally Once a day 30 days 30 tablet 3   Artificial Tear Ointment (DRY EYES OP) Place 1 drop into both eyes daily at 2 PM.     Ascorbic Acid (VITAMIN C) 1000 MG tablet Take 1,000 mg by mouth daily.       B Complex-C (SUPER B COMPLEX  PO) Take 1 capsule by mouth daily at 2 PM.     calcium  carbonate (OSCAL) 1500 (600 Ca) MG TABS tablet Take 1 tablet by mouth daily.     cholecalciferol (VITAMIN D) 1000 UNITS tablet Take 1,000 Units by mouth daily.       Coenzyme Q10 (CO Q-10 PO) Take 1 tablet by mouth daily.     cromolyn  (OPTICROM ) 4 % ophthalmic solution Place 1 drop into both eyes 4 (four) times daily. 10 mL 11   dapagliflozin  propanediol (FARXIGA ) 10 MG TABS tablet Take 1 tablet (10 mg total) by mouth daily before breakfast. 30 tablet 5   diltiazem  (CARDIZEM  CD) 360 MG 24 hr capsule Take 1 capsule (360 mg total) by mouth daily. 90 capsule 2   diltiazem  (CARDIZEM ) 30 MG tablet Take 1/2 to 1 tablet every 4 hours AS NEEDED for heart  rate >100 as long as top blood pressure >100. 45 tablet 3   famotidine (PEPCID) 20 MG tablet Take 20 mg by mouth daily.     flecainide  (TAMBOCOR ) 50 MG tablet Take 1 tablet (50 mg total) by mouth 2 (two) times daily. 60 tablet 9   levothyroxine  (SYNTHROID ) 50 MCG tablet Take 1 tablet in the morning on an empty stomach by mouth once a week for 90 days (Patient taking differently: Take 50 mcg by mouth once a week. Sundays) 36 tablet 3   levothyroxine  (SYNTHROID ) 75 MCG tablet TAKE 75 MCG BY MOUTH FOR 6 DAYS A WEEK (TAKE 50 MCG THE REMAINING 1 DAY OF THE WEEK). Oral 90 days 78 tablet 3   methylcellulose (CITRUCEL) oral powder Take 2 packets by mouth daily.     metoprolol  succinate (TOPROL  XL) 50 MG 24 hr tablet Take 1.5 tablets (75 mg total) by mouth 2 (two) times daily. 270 tablet 3   Multiple Vitamins-Minerals (ICAPS AREDS 2 PO) Take 1 capsule by mouth daily.      Omega-3 Fatty Acids (FISH OIL) 1200 MG CAPS Take 1,200 mg by mouth daily. Mega Red.     polyethylene glycol (MIRALAX  / GLYCOLAX ) 17 g packet Take 17 g by mouth as needed.     potassium chloride  SA (KLOR-CON  M) 20 MEQ tablet Take 1 tablet (20 mEq total) by mouth daily. 90 tablet 3   pravastatin  (PRAVACHOL ) 10 MG tablet Take 1 tablet (10 mg total) by mouth daily. 90 tablet 2   Probiotic Product (ALIGN PO) Take 1 capsule by mouth daily.     Rivaroxaban  (XARELTO ) 15 MG TABS tablet Take 1 tablet (15 mg total) by mouth daily with supper. 90 tablet 2   sodium chloride  (OCEAN) 0.65 % SOLN nasal spray Place 1 spray into both nostrils daily.     Zinc  50 MG CAPS Take 50 mg by mouth daily.      bumetanide  (BUMEX ) 1 MG tablet Take 3 tablets (3 mg total) by mouth in the morning AND 2 tablets (2 mg total) every evening. Ok to use an extra 1/2 tablet as needed for swelling. 220 tablet 6   No current facility-administered medications for this encounter.   BP 130/70   Pulse 65   Wt 54.1 kg (119 lb 3.2 oz)   SpO2 96%   BMI 23.28 kg/m  General:  NAD Neck: JVP 8-9 cm, no thyromegaly or thyroid  nodule.  Lungs: Clear to auscultation bilaterally with normal respiratory effort. CV: Nondisplaced PMI.  Heart regular S1/S2, no S3/S4, 2/6 HSM LLSB.  Trace ankle edema.  No carotid bruit.  Normal pedal  pulses.  Abdomen: Soft, nontender, no hepatosplenomegaly, no distention.  Skin: Intact without lesions or rashes.  Neurologic: Alert and oriented x 3.  Psych: Normal affect. Extremities: No clubbing or cyanosis.  HEENT: Normal.   Assessment/Plan: 1. Chronic diastolic CHF: Echo in 7/24 showed EF 55-60%, mild LVH, normal RV, moderate MR, severe biatrial enlargement, moderate AI, severe TR.  Echo today on my review showed EF 55-60%, mild LVH, mild RV dilation with mildly decreased RV systolic function, moderate MR, moderate-severe TR, PASP 48 mmHg, mild AI, no AS, moderate-severe LAE.  Her CHF may be due to long-standing atriopathy with dilation of the atria and development of significant TR as well as diastolic LV dysfunction with moderate presumably pulmonary venous hypertension. Weight is up 2 lbs and she looks mildly volume overloaded on exam.  NYHA class II symptoms.   - Increase bumetanide  to 3 mg qam/2 mg qpm. BMET/BNP today and BMET in 10 days. Add KCl 20 daily.  - Continue Farxiga  10 mg daily.  - She stopped spironolactone  because she felt like it triggered AF.  I will leave her off it for now.   2. Tricuspid regurgitation: Severe on echo from 7/24. Moderate to severe on today's echo.  Suspect atrial functional TR with severe right atrial enlargement in setting of atrial fibrillation.  - She may be a candidate for Triclip.  However, with minimal symptoms would hold off for the time being.   3. Atrial fibrillation: Paroxysmal.  She has been in NSR  I am a little leery about flecainide  use in a patient with structural heart disease, but she has been on it for >2 years with no problems.    - Continue flecainide  50 mg bid.  - Continue current  Toprol  XL and diltiazem  CD.  - Continue Xarelto  15 mg daily.  4. Sick sinus syndrome: Has MDT PPM.   Followup in 3 months with APP   I spent 32 minutes reviewing records, interviewing/examining patient, and managing orders.    Peder Bourdon 06/21/2023

## 2023-06-23 ENCOUNTER — Encounter

## 2023-06-23 DIAGNOSIS — Z1231 Encounter for screening mammogram for malignant neoplasm of breast: Secondary | ICD-10-CM

## 2023-06-27 NOTE — Progress Notes (Signed)
 Remote pacemaker transmission.

## 2023-06-28 ENCOUNTER — Ambulatory Visit
Admission: RE | Admit: 2023-06-28 | Discharge: 2023-06-28 | Disposition: A | Discharge status: Home / Self Care | Source: Ambulatory Visit | Attending: Internal Medicine | Admitting: Internal Medicine

## 2023-06-28 DIAGNOSIS — Z1231 Encounter for screening mammogram for malignant neoplasm of breast: Secondary | ICD-10-CM

## 2023-07-04 ENCOUNTER — Other Ambulatory Visit: Payer: Self-pay | Admitting: Cardiovascular Disease

## 2023-07-05 ENCOUNTER — Other Ambulatory Visit: Payer: Self-pay

## 2023-07-05 ENCOUNTER — Other Ambulatory Visit (HOSPITAL_BASED_OUTPATIENT_CLINIC_OR_DEPARTMENT_OTHER): Payer: Self-pay

## 2023-07-05 ENCOUNTER — Other Ambulatory Visit (HOSPITAL_COMMUNITY): Payer: Self-pay | Admitting: Cardiology

## 2023-07-05 ENCOUNTER — Other Ambulatory Visit (HOSPITAL_COMMUNITY): Payer: Self-pay | Admitting: Internal Medicine

## 2023-07-05 DIAGNOSIS — I5032 Chronic diastolic (congestive) heart failure: Secondary | ICD-10-CM | POA: Diagnosis not present

## 2023-07-05 MED ORDER — PRAVASTATIN SODIUM 10 MG PO TABS
10.0000 mg | ORAL_TABLET | Freq: Every day | ORAL | 3 refills | Status: AC
  Filled 2023-07-05: qty 90, 90d supply, fill #0
  Filled 2023-10-05: qty 90, 90d supply, fill #1
  Filled 2024-01-04: qty 90, 90d supply, fill #2

## 2023-07-06 ENCOUNTER — Encounter (HOSPITAL_COMMUNITY): Payer: Self-pay

## 2023-07-06 ENCOUNTER — Other Ambulatory Visit (HOSPITAL_BASED_OUTPATIENT_CLINIC_OR_DEPARTMENT_OTHER): Payer: Self-pay

## 2023-07-06 ENCOUNTER — Ambulatory Visit (HOSPITAL_COMMUNITY): Payer: Self-pay | Admitting: Cardiology

## 2023-07-06 LAB — BASIC METABOLIC PANEL WITH GFR
BUN/Creatinine Ratio: 30 — ABNORMAL HIGH (ref 12–28)
BUN: 32 mg/dL — ABNORMAL HIGH (ref 8–27)
CO2: 23 mmol/L (ref 20–29)
Calcium: 9.1 mg/dL (ref 8.7–10.3)
Chloride: 95 mmol/L — ABNORMAL LOW (ref 96–106)
Creatinine, Ser: 1.06 mg/dL — ABNORMAL HIGH (ref 0.57–1.00)
Glucose: 102 mg/dL — ABNORMAL HIGH (ref 70–99)
Potassium: 3.8 mmol/L (ref 3.5–5.2)
Sodium: 138 mmol/L (ref 134–144)
eGFR: 51 mL/min/{1.73_m2} — ABNORMAL LOW (ref >59)

## 2023-07-25 ENCOUNTER — Other Ambulatory Visit (HOSPITAL_BASED_OUTPATIENT_CLINIC_OR_DEPARTMENT_OTHER): Payer: Self-pay

## 2023-08-12 ENCOUNTER — Other Ambulatory Visit: Payer: Self-pay

## 2023-08-13 ENCOUNTER — Other Ambulatory Visit (HOSPITAL_BASED_OUTPATIENT_CLINIC_OR_DEPARTMENT_OTHER): Payer: Self-pay

## 2023-08-15 ENCOUNTER — Ambulatory Visit: Attending: Cardiovascular Disease | Admitting: Cardiovascular Disease

## 2023-08-15 ENCOUNTER — Encounter: Payer: Self-pay | Admitting: Cardiovascular Disease

## 2023-08-15 ENCOUNTER — Other Ambulatory Visit: Payer: Self-pay

## 2023-08-15 ENCOUNTER — Ambulatory Visit: Payer: Self-pay

## 2023-08-15 VITALS — BP 120/60 | HR 61 | Ht 60.0 in | Wt 120.7 lb

## 2023-08-15 DIAGNOSIS — I495 Sick sinus syndrome: Secondary | ICD-10-CM

## 2023-08-15 DIAGNOSIS — I482 Chronic atrial fibrillation, unspecified: Secondary | ICD-10-CM | POA: Diagnosis not present

## 2023-08-15 DIAGNOSIS — I493 Ventricular premature depolarization: Secondary | ICD-10-CM

## 2023-08-15 LAB — CUP PACEART REMOTE DEVICE CHECK
Battery Remaining Longevity: 77 mo
Battery Voltage: 2.98 V
Brady Statistic AP VP Percent: 92.28 %
Brady Statistic AP VS Percent: 1 %
Brady Statistic AS VP Percent: 3.21 %
Brady Statistic AS VS Percent: 3.5 %
Brady Statistic RA Percent Paced: 93.95 %
Brady Statistic RV Percent Paced: 95.03 %
Date Time Interrogation Session: 20250707035407
Implantable Lead Connection Status: 753985
Implantable Lead Connection Status: 753985
Implantable Lead Implant Date: 20101109
Implantable Lead Implant Date: 20101109
Implantable Lead Location: 753859
Implantable Lead Location: 753860
Implantable Lead Model: 4469
Implantable Lead Model: 4470
Implantable Lead Serial Number: 535369
Implantable Lead Serial Number: 657967
Implantable Pulse Generator Implant Date: 20210708
Lead Channel Impedance Value: 247 Ohm
Lead Channel Impedance Value: 361 Ohm
Lead Channel Impedance Value: 399 Ohm
Lead Channel Impedance Value: 418 Ohm
Lead Channel Pacing Threshold Amplitude: 0.625 V
Lead Channel Pacing Threshold Amplitude: 1 V
Lead Channel Pacing Threshold Pulse Width: 0.4 ms
Lead Channel Pacing Threshold Pulse Width: 0.4 ms
Lead Channel Sensing Intrinsic Amplitude: 0.125 mV
Lead Channel Sensing Intrinsic Amplitude: 0.125 mV
Lead Channel Sensing Intrinsic Amplitude: 3.75 mV
Lead Channel Sensing Intrinsic Amplitude: 3.75 mV
Lead Channel Setting Pacing Amplitude: 2.5 V
Lead Channel Setting Pacing Amplitude: 2.5 V
Lead Channel Setting Pacing Pulse Width: 0.4 ms
Lead Channel Setting Sensing Sensitivity: 1.2 mV
Zone Setting Status: 755011
Zone Setting Status: 755011

## 2023-08-15 LAB — CUP PACEART INCLINIC DEVICE CHECK
Date Time Interrogation Session: 20250707153248
Implantable Lead Connection Status: 753985
Implantable Lead Connection Status: 753985
Implantable Lead Implant Date: 20101109
Implantable Lead Implant Date: 20101109
Implantable Lead Location: 753859
Implantable Lead Location: 753860
Implantable Lead Model: 4469
Implantable Lead Model: 4470
Implantable Lead Serial Number: 535369
Implantable Lead Serial Number: 657967
Implantable Pulse Generator Implant Date: 20210708

## 2023-08-15 NOTE — Progress Notes (Signed)
 Cardiology Office Note Date:  08/15/2023  Patient ID:  Kathryn, Gibson 08-25-1933, MRN [id number] PCP:  Janey Santos, MD  Cardiologist:  Dr. Raford Electrophysiologist: Dr. Kelsie    Chief Complaint:  6 mo  History of Present Illness: Kathryn Gibson is a 89 y.o. female with history of AFib, bradycardia w/PPM, HTN, hypothyroidism. She returns today for routine EP follow-up.  she has no device related complaints -- no new tenderness, drainage, redness.  She has not had any episodes of atrial fibrillation that she has been aware of.     Device information  MDT dual chamber PPM implanted 12/17/2008, gen change 08/16/2019  AFib hx 07/01/2015: PVI ablation, including Mitral isthmus and Cavo-tricuspid isthmus ablation noting Multiple atypical atrial flutter circuits observed too unstable for mapping  Has had an Atach  AAD  Amiodarone  goes back at least to 2012 >>> STOPPED 2/2 abn LFTs Flecainide  started Jan 2022      PHYSICAL EXAM:  VS:  BP 120/60 (BP Location: Left Arm, Patient Position: Sitting, Cuff Size: Normal)   Pulse 61   Ht 5' (1.524 m)   Wt 120 lb 11.2 oz (54.7 kg)   SpO2 97%   BMI 23.57 kg/m  BMI: Body mass index is 23.57 kg/m. Well nourished, well developed, in no acute distress  HEENT: normocephalic, atraumatic  Neck: no JVD, carotid bruits or masses Cardiac:   RRR; no significant murmurs, no rubs, or gallops Lungs:  CTA b/l, no wheezing, rhonchi or rales  Abd: soft, nontender MS: no deformity or atrophy Ext:  trace is any edema noted b/l Skin: warm and dry, no rash Neuro:  No gross deficits appreciated Psych: euthymic mood, full affect  PPM site (R side) is stable, no tethering or discomfort   EKG:        TTE 08/16/2022 LVEF 55 to 60%.  No regional wall motion abnormalities.  Left and right atria are severely dilated.  PPM interrogation done today and reviewed by myself;  See Paceart   09/26/2020: stress myoview  There was no ST segment  deviation noted during stress. The study is normal. Negative for ischemia. This is a low risk study. This study was not gated.   07/01/2015; EPA/Ablation, Dr. Kelsie CONCLUSIONS: 1. Atrial paced rhythm upon presentation.   2. Successful electrical isolation and anatomical encircling of all four pulmonary veins with radiofrequency current and a WACA approach.  Additional left atrial ablation was performed to create a standard box lesion along the posterior wall of the left atrium. 3. Multiple atypical atrial flutter circuits observed too unstable for mapping today.  Mitral isthmus and Cavo-tricuspid isthmus ablation performed today 4. Successful cardioversion to sinus rhythm 5. No early apparent complications   04/11/2020: TTE IMPRESSIONS   1. Left ventricular ejection fraction, by estimation, is 60 to 65%. The  left ventricle has normal function. The left ventricle has no regional  wall motion abnormalities. There is mild left ventricular hypertrophy.  Left ventricular diastolic parameters  are consistent with Grade III diastolic dysfunction (restrictive).  Elevated left atrial pressure.   2. Right ventricular systolic function is normal. The right ventricular  size is normal. There is mildly elevated pulmonary artery systolic  pressure. The estimated right ventricular systolic pressure is 35.7 mmHg.   3. Left atrial size was moderately dilated.   4. Right atrial size was mildly dilated.   5. The mitral valve is normal in structure. Mild to moderate mitral valve  regurgitation.   6. Tricuspid valve regurgitation  is mild to moderate.   7. The aortic valve is tricuspid. Aortic valve regurgitation is moderate.  Mild aortic valve sclerosis is present, with no evidence of aortic valve  stenosis.   8. The inferior vena cava is normal in size with greater than 50%  respiratory variability, suggesting right atrial pressure of 3 mmHg.      Recent Labs: 12/24/2022: ALT 29 06/20/2023: B  Natriuretic Peptide 399.0 07/05/2023: BUN 32; Creatinine, Ser 1.06; Potassium 3.8; Sodium 138  12/24/2022: Chol/HDL Ratio 2.3; Cholesterol, Total 122; HDL 54; LDL Chol Calc (NIH) 56; Triglycerides 55   CrCl cannot be calculated (Patient's most recent lab result is older than the maximum 21 days allowed.).   Wt Readings from Last 3 Encounters:  08/15/23 120 lb 11.2 oz (54.7 kg)  06/20/23 119 lb 3.2 oz (54.1 kg)  06/02/23 119 lb (54 kg)     Other studies reviewed: Additional studies/records reviewed today include: summarized above  ASSESSMENT AND PLAN:  1. PPM     Intact function     No programming changes made     She is device dependent today   2. Paroxysmal AFib     Has had an Atach as well     CHA2DS2vasc is 4, on xarelto  appropriately dosed -labs September 2024 reviewed     On flecainide , dilt      Device-detected < 0.1 % burden  She remains happy with her arrhythmia burden, does not want to make any changes. We discussed alternatives to flecainide , which she has tolerated well to-date. Her renal function is a little tenuous and she may have some mild LVH on TTE. I think the flecainide  should be discontinued if she has any worsening in renal function. At present, she feels much better in normal rhythm and is symptomatic with her brief arrhythmia episodes.   .  3. HTN     Looks good         Signed, Eulas FORBES Furbish, MD  08/15/2023 3:25 PM     [facility] [address] [place] [address] [phone] (office)  [phone] (fax)

## 2023-08-15 NOTE — Patient Instructions (Signed)
 Medication Instructions:  Your physician recommends that you continue on your current medications as directed. Please refer to the Current Medication list given to you today.  *If you need a refill on your cardiac medications before your next appointment, please call your pharmacy*  Lab Work: None ordered.  If you have labs (blood work) drawn today and your tests are completely normal, you will receive your results only by: MyChart Message (if you have MyChart) OR A paper copy in the mail If you have any lab test that is abnormal or we need to change your treatment, we will call you to review the results.  Testing/Procedures: None ordered.   Follow-Up: At [facility], you and your health needs are our priority.  As part of our continuing mission to provide you with exceptional heart care, our providers are all part of one team.  This team includes your primary Cardiologist (physician) and Advanced Practice Providers or APPs (Physician Assistants and Nurse Practitioners) who all work together to provide you with the care you need, when you need it.  Your next appointment:   6 months with Dr Mealor

## 2023-08-16 ENCOUNTER — Ambulatory Visit: Payer: Self-pay | Admitting: Cardiovascular Disease

## 2023-08-16 ENCOUNTER — Other Ambulatory Visit: Payer: Self-pay

## 2023-08-21 ENCOUNTER — Encounter (HOSPITAL_BASED_OUTPATIENT_CLINIC_OR_DEPARTMENT_OTHER): Payer: Self-pay | Admitting: Cardiovascular Disease

## 2023-08-22 ENCOUNTER — Other Ambulatory Visit: Payer: Self-pay

## 2023-08-22 ENCOUNTER — Other Ambulatory Visit (HOSPITAL_BASED_OUTPATIENT_CLINIC_OR_DEPARTMENT_OTHER): Payer: Self-pay

## 2023-08-22 NOTE — Telephone Encounter (Signed)
 She is okay to try this.  It should be taken 2-4 hours apart from her other medications to avoid interaction.  Also have her be aware that for some people magnesium can cause diarrhea or loose stools.

## 2023-08-23 ENCOUNTER — Other Ambulatory Visit (HOSPITAL_BASED_OUTPATIENT_CLINIC_OR_DEPARTMENT_OTHER): Payer: Self-pay | Admitting: Family

## 2023-08-23 ENCOUNTER — Encounter (HOSPITAL_BASED_OUTPATIENT_CLINIC_OR_DEPARTMENT_OTHER): Payer: Self-pay | Admitting: Cardiovascular Disease

## 2023-08-23 ENCOUNTER — Other Ambulatory Visit (HOSPITAL_BASED_OUTPATIENT_CLINIC_OR_DEPARTMENT_OTHER): Payer: Self-pay

## 2023-08-23 ENCOUNTER — Other Ambulatory Visit: Payer: Self-pay

## 2023-08-23 DIAGNOSIS — I5032 Chronic diastolic (congestive) heart failure: Secondary | ICD-10-CM

## 2023-08-23 MED ORDER — DAPAGLIFLOZIN PROPANEDIOL 10 MG PO TABS
10.0000 mg | ORAL_TABLET | Freq: Every day | ORAL | 9 refills | Status: AC
  Filled 2023-08-23: qty 30, 30d supply, fill #0
  Filled 2023-09-19: qty 30, 30d supply, fill #1
  Filled 2023-10-22: qty 30, 30d supply, fill #2
  Filled 2023-11-17: qty 30, 30d supply, fill #3
  Filled 2023-12-19: qty 30, 30d supply, fill #4
  Filled 2024-01-18: qty 30, 30d supply, fill #5
  Filled 2024-02-15: qty 30, 30d supply, fill #6
  Filled 2024-03-16: qty 30, 30d supply, fill #7

## 2023-08-25 ENCOUNTER — Telehealth (HOSPITAL_COMMUNITY): Payer: Self-pay

## 2023-08-25 NOTE — Telephone Encounter (Signed)
 Called to confirm/remind patient of their appointment at the [facility] on 08/26/23.   Appointment:   [x] Confirmed  [] Left mess   [] No answer/No voice mail  [] VM Full/unable to leave message  [] Phone not in service  Patient reminded to bring all medications and/or complete list.  Confirmed patient has transportation. Gave directions, instructed to utilize valet parking.

## 2023-08-26 ENCOUNTER — Ambulatory Visit (HOSPITAL_COMMUNITY)
Admission: RE | Admit: 2023-08-26 | Discharge: 2023-08-26 | Disposition: A | Discharge status: Home / Self Care | Source: Ambulatory Visit | Attending: Adult Health | Admitting: Adult Health

## 2023-08-26 ENCOUNTER — Ambulatory Visit (HOSPITAL_COMMUNITY): Payer: Self-pay | Admitting: Adult Health

## 2023-08-26 ENCOUNTER — Encounter (HOSPITAL_COMMUNITY): Payer: Self-pay

## 2023-08-26 VITALS — BP 125/66 | HR 76 | Ht 60.0 in | Wt 122.0 lb

## 2023-08-26 DIAGNOSIS — I083 Combined rheumatic disorders of mitral, aortic and tricuspid valves: Secondary | ICD-10-CM | POA: Diagnosis not present

## 2023-08-26 DIAGNOSIS — I5189 Other ill-defined heart diseases: Secondary | ICD-10-CM

## 2023-08-26 DIAGNOSIS — Z7901 Long term (current) use of anticoagulants: Secondary | ICD-10-CM | POA: Diagnosis not present

## 2023-08-26 DIAGNOSIS — Z79899 Other long term (current) drug therapy: Secondary | ICD-10-CM | POA: Insufficient documentation

## 2023-08-26 DIAGNOSIS — R0602 Shortness of breath: Secondary | ICD-10-CM | POA: Diagnosis not present

## 2023-08-26 DIAGNOSIS — Z950 Presence of cardiac pacemaker: Secondary | ICD-10-CM | POA: Diagnosis not present

## 2023-08-26 DIAGNOSIS — I5032 Chronic diastolic (congestive) heart failure: Secondary | ICD-10-CM | POA: Insufficient documentation

## 2023-08-26 DIAGNOSIS — I480 Paroxysmal atrial fibrillation: Secondary | ICD-10-CM | POA: Diagnosis not present

## 2023-08-26 DIAGNOSIS — I495 Sick sinus syndrome: Secondary | ICD-10-CM | POA: Diagnosis not present

## 2023-08-26 LAB — CBC
HCT: 34.6 % — ABNORMAL LOW (ref 36.0–46.0)
Hemoglobin: 11 g/dL — ABNORMAL LOW (ref 12.0–15.0)
MCH: 24.1 pg — ABNORMAL LOW (ref 26.0–34.0)
MCHC: 31.8 g/dL (ref 30.0–36.0)
MCV: 75.7 fL — ABNORMAL LOW (ref 80.0–100.0)
Platelets: 254 K/uL (ref 150–400)
RBC: 4.57 MIL/uL (ref 3.87–5.11)
RDW: 17.2 % — ABNORMAL HIGH (ref 11.5–15.5)
WBC: 7.7 K/uL (ref 4.0–10.5)
nRBC: 0 % (ref 0.0–0.2)

## 2023-08-26 LAB — BASIC METABOLIC PANEL WITH GFR
Anion gap: 13 (ref 5–15)
BUN: 24 mg/dL — ABNORMAL HIGH (ref 8–23)
CO2: 27 mmol/L (ref 22–32)
Calcium: 9.4 mg/dL (ref 8.9–10.3)
Chloride: 98 mmol/L (ref 98–111)
Creatinine, Ser: 0.96 mg/dL (ref 0.44–1.00)
GFR, Estimated: 57 mL/min — ABNORMAL LOW (ref >60)
Glucose, Bld: 112 mg/dL — ABNORMAL HIGH (ref 70–99)
Potassium: 3.4 mmol/L — ABNORMAL LOW (ref 3.5–5.1)
Sodium: 138 mmol/L (ref 135–145)

## 2023-08-26 NOTE — Progress Notes (Signed)
 PCP: Avva, Ravisankar, MD Cardiology: Dr. Raford HF Cardiology: Dr. Rolan  Chief Complaint: Heart Failure . Shortness of breath.   89 y.o. with history of paroxysmal atrial fibrillation, sick sinus syndrome s/p Medtronic PPM, and chronic diastolic CHF was referred by Dr. Raford for evaluation of CHF.  Patient has a long history of atrial fibrillation.  She had an ablation in 2017.  She was on amiodarone  for rhythm control initially, but developed elevated LFTs so this was ultimately stopped.  Since 2022, she has been on flecainide  with no problems. She has sick sinus syndrome and is pacemaker dependent.  Patient had a stress Cardiolite in 8/22 that showed no ischemia/infarction.    Echo in 7/24 showed EF 55-60%, mild LVH, normal RV, moderate MR, severe biatrial enlargement, moderate AI, severe TR.    Echo 06/2023  EF 55-60%, mild LVH, mild RV dilation with mildly decreased RV systolic function, moderate MR, moderate-severe TR, PASP 48 mmHg, mild AI, no AS, moderate-severe LAE.   Today she returns for HF follow up. On Wednesday she noticed abdominal bloating and orthopnea. Says its better today but she is concerned about some bloating. No BRBP.  Having regular bowl movements.  Appetite ok. No fever or chills. Taking all medications.  Medtronic device interrogation: Activity 1.2 hr per day.   Labs (11/24): K 3.8, creatinine 1.08, LDL 56 Labs (12/24): BNP 359, K 3.8, creatinine 1.23 Labs (1/25): BNP 554, K 3.5, creatinine 1.23  PMH: 1. Atrial fibrillation: Paroxysmal.   - AF ablation in 5/17.  - Elevated LFTs on amiodarone , stopped.  2. CAD: Coronary calcification noted on imaging.  - Cardiolite in 8/22 with no ischemia/infarction 3. Sick sinus syndrome: Medtronic PPM.  4. HTN 5. H/o atrial tachycardia 6. Hypothyroidism 7. Chronic diastolic CHF: Echo in 7/24 with EF 55-60%, mild LVH, normal RV, moderate MR, severe biatrial enlargement, moderate AI, severe TR.  - Echo (5/25, my  review): EF 55-60%, mild LVH, mild RV dilation with mildly decreased RV systolic function, moderate MR, moderate-severe TR, PASP 48 mmHg, mild AI, no AS, moderate-severe LAE.   SH: Widowed, lives at [facility].  Occasional ETOH.  No smoking.   Family History  Problem Relation Age of Onset   Hypertension Mother    Congestive Heart Failure Mother    Dementia Mother    Heart attack Father 69       MI cause of death   Diabetes Paternal Grandfather    CAD Brother        2 stents   Lung cancer Son    Leukemia Daughter    Colon cancer Neg Hx    Kidney disease Neg Hx    Liver disease Neg Hx    Esophageal cancer Neg Hx    Stroke Neg Hx    Rectal cancer Neg Hx    Stomach cancer Neg Hx    Current Outpatient Medications  Medication Sig Dispense Refill   acetaminophen  (TYLENOL ) 500 MG tablet Take 500-1,000 mg by mouth as needed for moderate pain (pain score 4-6) or headache.     Artificial Tear Ointment (DRY EYES OP) Place 1 drop into both eyes daily at 2 PM.     Ascorbic Acid (VITAMIN C) 1000 MG tablet Take 1,000 mg by mouth daily.       B Complex-C (SUPER B COMPLEX PO) Take 1 capsule by mouth daily at 2 PM.     bumetanide  (BUMEX ) 1 MG tablet Take 3 tablets (3 mg total) by mouth in the morning  AND 2 tablets (2 mg total) every evening. Ok to use an extra 1/2 tablet as needed for swelling. 220 tablet 6   calcium  carbonate (OSCAL) 1500 (600 Ca) MG TABS tablet Take 1 tablet by mouth daily.     cholecalciferol (VITAMIN D) 1000 UNITS tablet Take 1,000 Units by mouth daily.       Coenzyme Q10 (CO Q-10 PO) Take 1 tablet by mouth daily.     dapagliflozin  propanediol (FARXIGA ) 10 MG TABS tablet Take 1 tablet (10 mg total) by mouth daily before breakfast. 30 tablet 9   diltiazem  (CARDIZEM  CD) 360 MG 24 hr capsule Take 1 capsule (360 mg total) by mouth daily. 90 capsule 2   diltiazem  (CARDIZEM ) 30 MG tablet Take 1/2 to 1 tablet every 4 hours AS NEEDED for heart rate >100 as long as top blood pressure  >100. 45 tablet 3   famotidine (PEPCID) 20 MG tablet Take 20 mg by mouth daily.     flecainide  (TAMBOCOR ) 50 MG tablet Take 1 tablet (50 mg total) by mouth 2 (two) times daily. 60 tablet 9   levothyroxine  (SYNTHROID ) 50 MCG tablet Take 1 tablet in the morning on an empty stomach by mouth once a week for 90 days 36 tablet 3   levothyroxine  (SYNTHROID ) 75 MCG tablet TAKE 75 MCG BY MOUTH FOR 6 DAYS A WEEK (TAKE 50 MCG THE REMAINING 1 DAY OF THE WEEK). Oral 90 days 78 tablet 3   methylcellulose (CITRUCEL) oral powder Take 2 packets by mouth daily.     metoprolol  succinate (TOPROL  XL) 50 MG 24 hr tablet Take 1.5 tablets (75 mg total) by mouth 2 (two) times daily. 270 tablet 3   Multiple Vitamins-Minerals (ICAPS AREDS 2 PO) Take 1 capsule by mouth daily.      Omega-3 Fatty Acids (FISH OIL) 1200 MG CAPS Take 1,200 mg by mouth daily. Mega Red.     polyethylene glycol (MIRALAX  / GLYCOLAX ) 17 g packet Take 17 g by mouth as needed.     potassium chloride  SA (KLOR-CON  M) 20 MEQ tablet Take 1 tablet (20 mEq total) by mouth daily. 90 tablet 3   pravastatin  (PRAVACHOL ) 10 MG tablet Take 1 tablet (10 mg total) by mouth daily. 90 tablet 3   Probiotic Product (ALIGN PO) Take 1 capsule by mouth daily.     Rivaroxaban  (XARELTO ) 15 MG TABS tablet Take 1 tablet (15 mg total) by mouth daily with supper. 90 tablet 2   sodium chloride  (OCEAN) 0.65 % SOLN nasal spray Place 1 spray into both nostrils daily.     Zinc  50 MG CAPS Take 50 mg by mouth daily.      cromolyn  (OPTICROM ) 4 % ophthalmic solution Place 1 drop into both eyes 4 (four) times daily. 10 mL 11   No current facility-administered medications for this encounter.   BP 125/66   Pulse 76   Ht 5' (1.524 m)   Wt 55.3 kg (122 lb)   SpO2 96%   BMI 23.83 kg/m  General:   No resp difficulty Neck: no JVD.  Cor: Regular rate & rhythm. Lungs: clear Abdomen: soft, nontender, nondistended.  Extremities: no  edema Neuro: alert & oriented  x3   Assessment/Plan: 1. Chronic diastolic CHF: Echo in 7/24 showed EF 55-60%, mild LVH, normal RV, moderate MR, severe biatrial enlargement, moderate AI, severe TR.  Echo today on my review showed EF 55-60%, mild LVH, mild RV dilation with mildly decreased RV systolic function, moderate MR, moderate-severe TR, PASP  48 mmHg, mild AI, no AS, moderate-severe LAE.  Her CHF may be due to long-standing atriopathy with dilation of the atria and development of significant TR as well as diastolic LV dysfunction with moderate presumably pulmonary venous hypertension.  ReDs reading: 28 %, normal. Not suggestive of fluid accumulation.  -NYHA  II. I dont think dyspnea is related to HF.  - Appears euvolemic.  Continue bumetanide  to 3 mg qam/2 mg qpm.  - Continue Farxiga  10 mg daily.  - She stopped spironolactone  because she felt like it triggered AF.   -Check BMET  2. Tricuspid regurgitation: Severe on echo from 7/24. Moderate to severe on recent Echo.  Suspect atrial functional TR with severe right atrial enlargement in setting of atrial fibrillation.  - She may be a candidate for Triclip.  However, with minimal symptoms would hold off for the time being.   3. Atrial fibrillation: Paroxysmal. Of not she has been on flecainide  >2 years despite structural heart disease. No recent A fib.  - Continue flecainide  50 mg bid.  - Continue current Toprol  XL and diltiazem  CD.  - Continue Xarelto  15 mg daily.  - Check CBC  4. Sick sinus syndrome: Has MDT PPM.    Follow up in 3 months Dr Rolan.       Kathryn Kitson NP-C  08/26/2023

## 2023-08-26 NOTE — Patient Instructions (Signed)
 Medication Changes:  No Changes In Medications at this time.   Lab Work:  Labs done today, your results will be available in MyChart, we will contact you for abnormal readings.  Follow-Up in: 3 months PLEASE CALL OUR OFFICE AROUND SEPTEMBER TO GET SCHEDULED FOR YOUR APPOINTMENT. PHONE NUMBER IS [phone] OPTION 2   At the [facility], you and your health needs are our priority. We have a designated team specialized in the treatment of Heart Failure. This Care Team includes your primary Heart Failure Specialized Cardiologist (physician), Advanced Practice Providers (APPs- Physician Assistants and Nurse Practitioners), and Pharmacist who all work together to provide you with the care you need, when you need it.   You may see any of the following providers on your designated Care Team at your next follow up:  Dr. Toribio Fuel Dr. Ezra Shuck Dr. Ria Commander Dr. Odis Brownie Greig Mosses, NP Caffie Shed, [place] [facility] [place], [place] Beckey Coe, NP [place] Lee, NP Tinnie Redman, PharmD   Please be sure to bring in all your medications bottles to every appointment.   Need to Contact Us :  If you have any questions or concerns before your next appointment please send us  a message through [place] or call our office at [phone].    TO LEAVE A MESSAGE FOR THE NURSE SELECT OPTION 2, PLEASE LEAVE A MESSAGE INCLUDING: YOUR NAME DATE OF BIRTH CALL BACK NUMBER REASON FOR CALL**this is important as we prioritize the call backs  YOU WILL RECEIVE A CALL BACK THE SAME DAY AS LONG AS YOU CALL BEFORE 4:00 PM

## 2023-08-26 NOTE — Progress Notes (Signed)
 ReDS Vest / Clip - 08/26/23 0900       ReDS Vest / Clip   Station Marker A    Ruler Value 27    ReDS Value Range Low volume    ReDS Actual Value 28

## 2023-08-29 DIAGNOSIS — I110 Hypertensive heart disease with heart failure: Secondary | ICD-10-CM | POA: Diagnosis not present

## 2023-08-29 DIAGNOSIS — D649 Anemia, unspecified: Secondary | ICD-10-CM | POA: Diagnosis not present

## 2023-08-30 ENCOUNTER — Other Ambulatory Visit (HOSPITAL_BASED_OUTPATIENT_CLINIC_OR_DEPARTMENT_OTHER): Payer: Self-pay

## 2023-08-30 MED ORDER — POTASSIUM CHLORIDE CRYS ER 20 MEQ PO TBCR
40.0000 meq | EXTENDED_RELEASE_TABLET | Freq: Every day | ORAL | 3 refills | Status: AC
  Filled 2023-08-30: qty 180, 90d supply, fill #0
  Filled 2024-01-02: qty 180, 90d supply, fill #1

## 2023-09-02 ENCOUNTER — Other Ambulatory Visit (HOSPITAL_BASED_OUTPATIENT_CLINIC_OR_DEPARTMENT_OTHER): Payer: Self-pay

## 2023-09-02 MED ORDER — ACCRUFER 30 MG PO CAPS
30.0000 mg | ORAL_CAPSULE | Freq: Two times a day (BID) | ORAL | 3 refills | Status: DC
  Filled 2023-09-02: qty 60, 30d supply, fill #0

## 2023-09-08 ENCOUNTER — Other Ambulatory Visit (HOSPITAL_BASED_OUTPATIENT_CLINIC_OR_DEPARTMENT_OTHER): Payer: Self-pay

## 2023-09-22 ENCOUNTER — Other Ambulatory Visit (HOSPITAL_BASED_OUTPATIENT_CLINIC_OR_DEPARTMENT_OTHER): Payer: Self-pay

## 2023-09-23 ENCOUNTER — Other Ambulatory Visit (HOSPITAL_BASED_OUTPATIENT_CLINIC_OR_DEPARTMENT_OTHER): Payer: Self-pay

## 2023-09-23 MED ORDER — AMITRIPTYLINE HCL 25 MG PO TABS
25.0000 mg | ORAL_TABLET | Freq: Every day | ORAL | 3 refills | Status: DC
  Filled 2023-09-23: qty 30, 30d supply, fill #0
  Filled 2023-10-24: qty 30, 30d supply, fill #1
  Filled 2023-11-17: qty 30, 30d supply, fill #2
  Filled 2023-12-15 – 2023-12-16 (×2): qty 30, 30d supply, fill #3

## 2023-09-26 DIAGNOSIS — Z961 Presence of intraocular lens: Secondary | ICD-10-CM | POA: Diagnosis not present

## 2023-09-26 DIAGNOSIS — H04123 Dry eye syndrome of bilateral lacrimal glands: Secondary | ICD-10-CM | POA: Diagnosis not present

## 2023-10-05 ENCOUNTER — Other Ambulatory Visit (HOSPITAL_BASED_OUTPATIENT_CLINIC_OR_DEPARTMENT_OTHER): Payer: Self-pay

## 2023-10-22 ENCOUNTER — Other Ambulatory Visit (HOSPITAL_BASED_OUTPATIENT_CLINIC_OR_DEPARTMENT_OTHER): Payer: Self-pay

## 2023-10-24 ENCOUNTER — Other Ambulatory Visit (HOSPITAL_BASED_OUTPATIENT_CLINIC_OR_DEPARTMENT_OTHER): Payer: Self-pay

## 2023-10-28 ENCOUNTER — Other Ambulatory Visit (HOSPITAL_BASED_OUTPATIENT_CLINIC_OR_DEPARTMENT_OTHER): Payer: Self-pay

## 2023-11-01 DIAGNOSIS — D509 Iron deficiency anemia, unspecified: Secondary | ICD-10-CM | POA: Diagnosis not present

## 2023-11-01 DIAGNOSIS — Z7901 Long term (current) use of anticoagulants: Secondary | ICD-10-CM | POA: Diagnosis not present

## 2023-11-02 ENCOUNTER — Ambulatory Visit (HOSPITAL_BASED_OUTPATIENT_CLINIC_OR_DEPARTMENT_OTHER): Admitting: Cardiovascular Disease

## 2023-11-02 ENCOUNTER — Encounter (HOSPITAL_BASED_OUTPATIENT_CLINIC_OR_DEPARTMENT_OTHER): Payer: Self-pay | Admitting: Cardiovascular Disease

## 2023-11-02 VITALS — BP 116/60 | HR 62 | Ht 60.5 in | Wt 122.3 lb

## 2023-11-02 DIAGNOSIS — Z5181 Encounter for therapeutic drug level monitoring: Secondary | ICD-10-CM

## 2023-11-02 DIAGNOSIS — I495 Sick sinus syndrome: Secondary | ICD-10-CM

## 2023-11-02 DIAGNOSIS — I480 Paroxysmal atrial fibrillation: Secondary | ICD-10-CM

## 2023-11-02 DIAGNOSIS — I5032 Chronic diastolic (congestive) heart failure: Secondary | ICD-10-CM | POA: Diagnosis not present

## 2023-11-02 DIAGNOSIS — I2510 Atherosclerotic heart disease of native coronary artery without angina pectoris: Secondary | ICD-10-CM

## 2023-11-02 DIAGNOSIS — I482 Chronic atrial fibrillation, unspecified: Secondary | ICD-10-CM

## 2023-11-02 DIAGNOSIS — I10 Essential (primary) hypertension: Secondary | ICD-10-CM

## 2023-11-02 DIAGNOSIS — R0602 Shortness of breath: Secondary | ICD-10-CM | POA: Diagnosis not present

## 2023-11-02 DIAGNOSIS — I361 Nonrheumatic tricuspid (valve) insufficiency: Secondary | ICD-10-CM

## 2023-11-02 NOTE — Patient Instructions (Signed)
 Medication Instructions:  Your physician recommends that you continue on your current medications as directed. Please refer to the Current Medication list given to you today.   *If you need a refill on your cardiac medications before your next appointment, please call your pharmacy*  Lab Work: BNP/BMET TODAY   If you have labs (blood work) drawn today and your tests are completely normal, you will receive your results only by: MyChart Message (if you have MyChart) OR A paper copy in the mail If you have any lab test that is abnormal or we need to change your treatment, we will call you to review the results.  Testing/Procedures: NONE  Follow-Up: At [facility], you and your health needs are our priority.  As part of our continuing mission to provide you with exceptional heart care, our providers are all part of one team.  This team includes your primary Cardiologist (physician) and Advanced Practice Providers or APPs (Physician Assistants and Nurse Practitioners) who all work together to provide you with the care you need, when you need it.  Your next appointment:   4 month(s)  Provider:   Annabella Scarce, MD or Reche Finder, NP     We recommend signing up for the patient portal called MyChart.  Sign up information is provided on this After Visit Summary.  MyChart is used to connect with patients for Virtual Visits (Telemedicine).  Patients are able to view lab/test results, encounter notes, upcoming appointments, etc.  Non-urgent messages can be sent to your provider as well.   To learn more about what you can do with MyChart, go to [email or web address].

## 2023-11-02 NOTE — Progress Notes (Signed)
 Cardiology Office Note:  .   Date:  11/06/2023  ID:  Kathryn Gibson, DOB December 11, 1933, MRN [id number] PCP: Kathryn Santos, MD  [facility] Providers Cardiologist:  Kathryn Scarce, MD Electrophysiologist:  Kathryn FORBES Furbish, MD    History of Present Illness: .   Kathryn Gibson is a 89 y.o. female with a hx of atrial fibrillation s/p ablation, SSS s/p PPM, severe tricuspid regurgitation, moderate mitral regurgitation, aortic atherosclerosis, asymptomatic coronary calcifications, recurrent epistaxis, and hypertension, who presents for follow-up.  Kathryn Gibson was previously a patient of Kathryn Gibson.  She underwent ablation for atrial fibrillation on 07/01/15.  She had an episode of atrial fibrillation 12/2015.  Amiodarone  was previously increased due to atrial tachycardia.  This was discontinued due to elevated LFTs which subsequently normalized.  She saw Kathryn Gibson 07/2018 and reported exertional dyspnea.  Kathryn Gibson offered stress testing which she wanted to think about.  Her device was interrogated 01/08/19 and was nearing ERI.  There were no episodes of atrial fibrillation noted.   Her generator was changed out 08/2019.  Afterward she reported feeling poorly and felt like her heart was racing, though is in sinus rhythm in the 90s.  Metoprolol  was increased.  She saw Kathryn Gibson on 05/2020 and was doing well.  Her A. fib burden was at 3.4%.  On her remote check 08/2020 it was 10.6%. She followed up with EP on 11/2020 and they did not make any changes. She had some chest tightness that was thought to be due to her thoracic spine fracture. She had a nuclear stress test 09/2020 that was normal.   She reported some LE edema. BNP was 310. Lasix  was increased to 40 mg. She followed up with EP 05/2021 and was doing well with minimal Afib. She was previously on 40 mg of Lasix  every day and felt better.  However she had mild renal dysfunction.  We changed her Lasix  to 40 mg on Monday, Wednesday, Friday, and  continued with 20 mg on the other days. At her follow-up 12/2021 with EP she was doing well.    At her appointment 04/2022 she was recovering from COVID-19.  She was seen 06/2022 after multiple MyChart messages reporting shortness of breath and edema.  She took increasing doses of torsemide  with symptomatic improvement.  She saw Kathryn Finder, NP 06/2022 and was doing better.  She is taking torsemide  20 mg with an additional 20 mg as needed.  Kathryn Gibson recommended that she start Farxiga .  BNP has been consistently in the 500s despite titrating torsemide .  GFR has been between 41 and 52.  Echo 08/2022 revealed increasing tricuspid regurgitation, which was now severe.  LVEF was 55-60% with grade 2 diastolic dysfunction.  She had moderate mitral regurgitation.  She was also noted to have aortic atherosclerosis.   At her visit 10/27/22 she was feeling poorly and volume overloaded.  Bumex  was increased to bid and symptoms improved at follow up the following week. She called the office 12/2022 with increasing shortness of breath and was instructed to take 1.5 torsemide  tablets.  She saw Kathryn Gibson in heart failure clinic 12/2022 with concern that her heart failure symptoms may due to her severe TR, diastolic dysfunction, atriopathy and pulmonary venous hypertension.  She had JVD  though her Kathryn Gibson was not elevated at 29%.  He added spironolactone  to her Bumex  and Farxiga .  There was consideration for Tri clip.  However given her normal-appearing right ventricle he elected to continue with  medical management.  She saw Kathryn Gibson 12/2022 and afib burdon was <1%. There has been discussion about stopping flecainide  due to underlying structural heart disease, but she declines.    Kathryn Gibson followed up with Kathryn Gibson 02/2023 had discontinued her spironolactone  because she felt that it triggered her atrial fibrillation. At her visit 05/2023 she noted 7lb weight gain and shortness of breath.  Echo 06/2023 revealed LVEF 55-60%  with mild LVH, mild RV dilated, mildly reduced RV function, mod MR, mod-severe TR, PASP 48.  She has followed in CHF clinic and volume status was stable per Kathryn Gibson.  Her shortness of breath was not thought to be related to HF.  They elected to hold off on tri-clip due to being euvolmic and lack of symptopms.      Discussed the use of AI scribe software for clinical note transcription with the patient, who gave verbal consent to proceed.  History of Present Illness Ms. Kathryn Gibson has experienced weight gain since Christmas, noting an increase of ten pounds. She describes a pattern of gaining three pounds after dining out, losing two pounds the next day, resulting in a net gain of one pound. She is concerned about fluid retention, particularly noticing swelling at night and a 'little basketball' like swelling in her abdomen.  She denies LE edema, orthopnea or PND.  She has been eating more than usual.   She is currently on Bumex , taking three milligrams in the morning and two at night, but it does not significantly increase her urination. Her current pills are not scored, so she has not taken additional doses.  She experiences a dry mouth, which she attributes to Bumex , affecting her taste and leading her to eat more in an attempt to find satisfying flavors. She also reports occasional coughing, which she believes is related to her dry mouth.  She has not experienced any recent episodes of atrial fibrillation. She is currently on a regimen that includes Farxiga , diltiazem , flecainide , metoprolol , Xarelto , and pravastatin .  She had lab work done recently at Kathryn Gibson, focusing on her iron  levels, as she is currently on iron  supplements. She mentions a new iron  supplement that is less constipating but expensive, though she found a more affordable option through a pharmacy in [place].  No significant shortness of breath with regular activities, only experiencing it after walking long  distances.    ROS:  As per HPI  Studies Reviewed: .       Echo 06/2023:  1. Apical windows are foreshortened. Makes accurate assessment of LVEF  difficult.   2. Distal inferior, inferoseptal and apical hypokinesis.. Left  ventricular ejection fraction, by estimation, is 50 to 55%. The left  ventricle has low normal function. There is mild concentric left  ventricular hypertrophy.   3. Right ventricular systolic function is mildly reduced. The right  ventricular size is normal. There is severely elevated pulmonary artery  systolic pressure.   4. Left atrial size was severely dilated.   5. Right atrial size was moderately dilated.   6. Moderate mitral valve regurgitation.   7. Tricuspid valve regurgitation is severe.   8. Aortic valve regurgitation is mild.   9. The inferior vena cava is dilated in size with >50% respiratory  variability, suggesting right atrial pressure of 8 mmHg.   Risk Assessment/Calculations:    CHA2DS2-VASc Score = 5   This indicates a 7.2% annual risk of stroke. The patient's score is based upon: CHF History: 1 HTN History: 1 Diabetes History:  0 Stroke History: 0 Vascular Disease History: 0 Age Score: 2 Gender Score: 1     Physical Exam:   VS:  BP 116/60   Pulse 62   Ht 5' 0.5 (1.537 m)   Wt 122 lb 4.8 oz (55.5 kg)   SpO2 95%   BMI 23.49 kg/m  , BMI Body mass index is 23.49 kg/m. GENERAL:  Well appearing HEENT: Pupils equal round and reactive, fundi not visualized, oral mucosa unremarkable NECK:  No jugular venous distention, waveform within normal limits, carotid upstroke brisk and symmetric, no bruits, no thyromegaly LUNGS:  Clear to auscultation bilaterally HEART:  RRR.  PMI not displaced or sustained,S1 and S2 within normal limits, no S3, no S4, no clicks, no rubs, II/VI systolic murmur ABD:  Flat, positive bowel sounds normal in frequency in pitch, no bruits, no rebound, no guarding, no midline pulsatile mass, no hepatomegaly, no  splenomegaly EXT:  2 plus pulses throughout, no edema, no cyanosis no clubbing SKIN:  No rashes no nodules NEURO:  Cranial nerves II through XII grossly intact, motor grossly intact throughout PSYCH:  Cognitively intact, oriented to person place and time   ASSESSMENT AND PLAN: .    Assessment & Plan # Chronic diastolic heart failure with volume management Bumex  regimen effective without excessive diuresis or significant dyspnea.  She doesn't appear volume overloaded on exam.  Weight gain seems more related to eating than fluid.  - Check BNP and BMP  - Continue Bumex  3 mg in the morning and 2 mg at night. - Continue Farxiga  for fluid management and renal protection.   # Atrial fibrillation:  - Continue diltiazem  for heart rate and blood pressure control. - Continue flecainide  for rhythm control. - Continue metoprolol  for heart failure and AFib management. - Continue Xarelto  for stroke prevention.  # Nonrheumatic tricuspid valve insufficiency Volume management as abo ve.   # Atherosclerotic heart disease of native coronary artery without angina # Hyperlipidemia: Atherosclerotic heart disease well-managed without angina. - Continue pravastatin  for cholesterol management.  # Essential hypertension - Continue current antihypertensive regimen.  # Xerostomia (dry mouth) Xerostomia likely exacerbated by Bumex , affecting taste and causing discomfort. - Recommend biotin for dry mouth. - Suggest sugar-free lemon candy to stimulate salivation.         Dispo: F/U IN 4 MONHTS  Signed, Kathryn Scarce, MD

## 2023-11-03 LAB — BASIC METABOLIC PANEL WITH GFR
BUN/Creatinine Ratio: 16 (ref 12–28)
BUN: 18 mg/dL (ref 8–27)
CO2: 26 mmol/L (ref 20–29)
Calcium: 9.2 mg/dL (ref 8.7–10.3)
Chloride: 96 mmol/L (ref 96–106)
Creatinine, Ser: 1.11 mg/dL — ABNORMAL HIGH (ref 0.57–1.00)
Glucose: 91 mg/dL (ref 70–99)
Potassium: 3.7 mmol/L (ref 3.5–5.2)
Sodium: 140 mmol/L (ref 134–144)
eGFR: 48 mL/min/1.73 — ABNORMAL LOW (ref >59)

## 2023-11-03 LAB — BRAIN NATRIURETIC PEPTIDE: BNP: 222.7 pg/mL — ABNORMAL HIGH (ref 0.0–100.0)

## 2023-11-06 ENCOUNTER — Encounter (HOSPITAL_BASED_OUTPATIENT_CLINIC_OR_DEPARTMENT_OTHER): Payer: Self-pay | Admitting: Cardiovascular Disease

## 2023-11-11 ENCOUNTER — Telehealth (HOSPITAL_COMMUNITY): Payer: Self-pay | Admitting: *Deleted

## 2023-11-11 NOTE — Telephone Encounter (Signed)
 Called to confirm/remind patient of their appointment at the [facility] on  11/14/23:       Appointment:              [x] Confirmed             [] Left mess              [] No answer/No voice mail             [] Phone not in service   Patient reminded to bring all medications and/or complete list.   Confirmed patient has transportation. Gave directions, instructed to utilize valet parking.

## 2023-11-12 ENCOUNTER — Other Ambulatory Visit (HOSPITAL_BASED_OUTPATIENT_CLINIC_OR_DEPARTMENT_OTHER): Payer: Self-pay

## 2023-11-14 ENCOUNTER — Ambulatory Visit (HOSPITAL_COMMUNITY)
Admission: RE | Admit: 2023-11-14 | Discharge: 2023-11-14 | Disposition: A | Source: Ambulatory Visit | Attending: Family Medicine

## 2023-11-14 ENCOUNTER — Encounter (HOSPITAL_COMMUNITY): Payer: Self-pay

## 2023-11-14 ENCOUNTER — Ambulatory Visit (INDEPENDENT_AMBULATORY_CARE_PROVIDER_SITE_OTHER): Payer: Self-pay

## 2023-11-14 VITALS — BP 126/68 | HR 67 | Wt 121.8 lb

## 2023-11-14 DIAGNOSIS — I110 Hypertensive heart disease with heart failure: Secondary | ICD-10-CM | POA: Diagnosis not present

## 2023-11-14 DIAGNOSIS — Z79899 Other long term (current) drug therapy: Secondary | ICD-10-CM | POA: Diagnosis not present

## 2023-11-14 DIAGNOSIS — Z7984 Long term (current) use of oral hypoglycemic drugs: Secondary | ICD-10-CM | POA: Diagnosis not present

## 2023-11-14 DIAGNOSIS — R0600 Dyspnea, unspecified: Secondary | ICD-10-CM | POA: Diagnosis not present

## 2023-11-14 DIAGNOSIS — I071 Rheumatic tricuspid insufficiency: Secondary | ICD-10-CM | POA: Insufficient documentation

## 2023-11-14 DIAGNOSIS — I361 Nonrheumatic tricuspid (valve) insufficiency: Secondary | ICD-10-CM | POA: Diagnosis not present

## 2023-11-14 DIAGNOSIS — Z7901 Long term (current) use of anticoagulants: Secondary | ICD-10-CM | POA: Diagnosis not present

## 2023-11-14 DIAGNOSIS — I495 Sick sinus syndrome: Secondary | ICD-10-CM | POA: Insufficient documentation

## 2023-11-14 DIAGNOSIS — I480 Paroxysmal atrial fibrillation: Secondary | ICD-10-CM | POA: Insufficient documentation

## 2023-11-14 DIAGNOSIS — I5032 Chronic diastolic (congestive) heart failure: Secondary | ICD-10-CM | POA: Insufficient documentation

## 2023-11-14 NOTE — Patient Instructions (Signed)
 No Changes In Medications at this time.   Follow-Up in: 6 months with Dr. Rolan PLEASE CALL OUR OFFICE AROUND February TO GET SCHEDULED FOR YOUR APPOINTMENT. PHONE NUMBER IS [phone] OPTION 2   At the [facility], you and your health needs are our priority. We have a designated team specialized in the treatment of Heart Failure. This Care Team includes your primary Heart Failure Specialized Cardiologist (physician), Advanced Practice Providers (APPs- Physician Assistants and Nurse Practitioners), and Pharmacist who all work together to provide you with the care you need, when you need it.   You may see any of the following providers on your designated Care Team at your next follow up:  Dr. Toribio Fuel Dr. Ezra Rolan Dr. Ria Commander Dr. Odis Brownie Greig Mosses, NP Caffie Shed, [place] [facility], [place] Beckey Coe, NP [place] Lee, NP Tinnie Redman, PharmD   Please be sure to bring in all your medications bottles to every appointment.   Need to Contact Us :  If you have any questions or concerns before your next appointment please send us  a message through [place] or call our office at [phone].    TO LEAVE A MESSAGE FOR THE NURSE SELECT OPTION 2, PLEASE LEAVE A MESSAGE INCLUDING: YOUR NAME DATE OF BIRTH CALL BACK NUMBER REASON FOR CALL**this is important as we prioritize the call backs  YOU WILL RECEIVE A CALL BACK THE SAME DAY AS LONG AS YOU CALL BEFORE 4:00 PM

## 2023-11-14 NOTE — Progress Notes (Signed)
 PCP: Avva, Ravisankar, MD Cardiology: Dr. Raford EP: Dr. Nancey HF Cardiology: Dr. Rolan  89 y.o. with history of paroxysmal atrial fibrillation, sick sinus syndrome s/p Medtronic PPM, and chronic diastolic CHF was referred by Dr. Raford for evaluation of CHF.  Patient has a long history of atrial fibrillation.  She had an ablation in 2017.  She was on amiodarone  for rhythm control initially, but developed elevated LFTs so this was ultimately stopped.  Since 2022, she has been on flecainide  with no problems. She has sick sinus syndrome and is pacemaker dependent.  Patient had a stress Cardiolite in 8/22 that showed no ischemia/infarction.    Echo in 7/24 showed EF 55-60%, mild LVH, normal RV, moderate MR, severe biatrial enlargement, moderate AI, severe TR.    Echo 06/2023  EF 55-60%, mild LVH, mild RV dilation with mildly decreased RV systolic function, moderate MR, moderate-severe TR, PASP 48 mmHg, mild AI, no AS, moderate-severe LAE.   Today she returns for HF follow up. Overall feeling fine. She has SOB walking further distances on flat ground. She uses her rolling walker when she has to walk further distances, Denies palpitations, abnormal bleeding, CP, dizziness, edema, or PND/Orthopnea. Appetite ok. Weight at home 119 pounds. Taking all medications.   Medtronic device interrogation (personally reviewed): no AT/AF, 1 hr/day of activity.   Labs (11/24): K 3.8, creatinine 1.08, LDL 56 Labs (12/24): BNP 359, K 3.8, creatinine 1.23 Labs (1/25): BNP 554, K 3.5, creatinine 1.23 Labs (9/25): K 3.7, creatinine 1.11  PMH: 1. Atrial fibrillation: Paroxysmal.   - AF ablation in 5/17.  - Elevated LFTs on amiodarone , stopped.  2. CAD: Coronary calcification noted on imaging.  - Cardiolite in 8/22 with no ischemia/infarction 3. Sick sinus syndrome: Medtronic PPM.  4. HTN 5. H/o atrial tachycardia 6. Hypothyroidism 7. Chronic diastolic CHF: Echo in 7/24 with EF 55-60%, mild LVH, normal RV,  moderate MR, severe biatrial enlargement, moderate AI, severe TR.  - Echo (5/25, my review): EF 55-60%, mild LVH, mild RV dilation with mildly decreased RV systolic function, moderate MR, moderate-severe TR, PASP 48 mmHg, mild AI, no AS, moderate-severe LAE.   SH: Widowed, lives at [facility].  Occasional ETOH.  No smoking.   Family History  Problem Relation Age of Onset   Hypertension Mother    Congestive Heart Failure Mother    Dementia Mother    Heart attack Father 26       MI cause of death   Diabetes Paternal Grandfather    CAD Brother        2 stents   Lung cancer Son    Leukemia Daughter    Colon cancer Neg Hx    Kidney disease Neg Hx    Liver disease Neg Hx    Esophageal cancer Neg Hx    Stroke Neg Hx    Rectal cancer Neg Hx    Stomach cancer Neg Hx    Current Outpatient Medications  Medication Sig Dispense Refill   ACCRUFER  30 MG CAPS Take 1 capsule by mouth 2 (two) times daily.     acetaminophen  (TYLENOL ) 500 MG tablet Take 500-1,000 mg by mouth as needed for moderate pain (pain score 4-6) or headache.     amitriptyline  (ELAVIL ) 25 MG tablet Take 1 tablet (25 mg total) by mouth at bedtime. 30 tablet 3   Artificial Tear Ointment (DRY EYES OP) Place 1 drop into both eyes daily at 2 PM.     Ascorbic Acid (VITAMIN C) 1000 MG tablet  Take 1,000 mg by mouth daily.       B Complex-C (SUPER B COMPLEX PO) Take 1 capsule by mouth daily at 2 PM.     bumetanide  (BUMEX ) 1 MG tablet Take 3 tablets (3 mg total) by mouth in the morning AND 2 tablets (2 mg total) every evening. Ok to use an extra 1/2 tablet as needed for swelling. 220 tablet 6   calcium  carbonate (OSCAL) 1500 (600 Ca) MG TABS tablet Take 1 tablet by mouth daily.     cholecalciferol (VITAMIN D) 1000 UNITS tablet Take 1,000 Units by mouth daily.       Coenzyme Q10 (CO Q-10 PO) Take 1 tablet by mouth daily.     cromolyn  (OPTICROM ) 4 % ophthalmic solution Place 1 drop into both eyes 4 (four) times daily. 10 mL 11    dapagliflozin  propanediol (FARXIGA ) 10 MG TABS tablet Take 1 tablet (10 mg total) by mouth daily before breakfast. 30 tablet 9   diltiazem  (CARDIZEM  CD) 360 MG 24 hr capsule Take 1 capsule (360 mg total) by mouth daily. 90 capsule 2   diltiazem  (CARDIZEM ) 30 MG tablet Take 1/2 to 1 tablet every 4 hours AS NEEDED for heart rate >100 as long as top blood pressure >100. 45 tablet 3   famotidine (PEPCID) 20 MG tablet Take 20 mg by mouth daily.     flecainide  (TAMBOCOR ) 50 MG tablet Take 1 tablet (50 mg total) by mouth 2 (two) times daily. 60 tablet 9   levothyroxine  (SYNTHROID ) 75 MCG tablet TAKE 75 MCG BY MOUTH FOR 6 DAYS A WEEK (TAKE 50 MCG THE REMAINING 1 DAY OF THE WEEK). Oral 90 days 78 tablet 3   methylcellulose (CITRUCEL) oral powder Take 2 packets by mouth daily.     metoprolol  succinate (TOPROL  XL) 50 MG 24 hr tablet Take 1.5 tablets (75 mg total) by mouth 2 (two) times daily. 270 tablet 3   Multiple Vitamins-Minerals (ICAPS AREDS 2 PO) Take 1 capsule by mouth daily.      Omega-3 Fatty Acids (FISH OIL) 1200 MG CAPS Take 1,200 mg by mouth daily. Mega Red.     polyethylene glycol (MIRALAX  / GLYCOLAX ) 17 g packet Take 17 g by mouth as needed.     potassium chloride  SA (KLOR-CON  M) 20 MEQ tablet Take 2 tablets (40 mEq total) by mouth daily. 180 tablet 3   pravastatin  (PRAVACHOL ) 10 MG tablet Take 1 tablet (10 mg total) by mouth daily. 90 tablet 3   Probiotic Product (ALIGN PO) Take 1 capsule by mouth daily.     Rivaroxaban  (XARELTO ) 15 MG TABS tablet Take 1 tablet (15 mg total) by mouth daily with supper. 90 tablet 2   sodium chloride  (OCEAN) 0.65 % SOLN nasal spray Place 1 spray into both nostrils daily.     Zinc  50 MG CAPS Take 50 mg by mouth daily.      No current facility-administered medications for this encounter.   Wt Readings from Last 3 Encounters:  11/14/23 55.2 kg (121 lb 12.8 oz)  11/02/23 55.5 kg (122 lb 4.8 oz)  08/26/23 55.3 kg (122 lb)   BP 126/68   Pulse 67   Wt 55.2 kg  (121 lb 12.8 oz)   SpO2 94%   BMI 23.40 kg/m   Physical Exam General:  NAD. No resp difficulty, walked into clinic with rolling clinic HEENT: Normal Neck: Supple. No JVD. Cor: Regular rate & rhythm. No rubs, gallops, 2/6 HSM LLSB Lungs: Clear Abdomen: Soft, nontender,  nondistended.  Extremities: No cyanosis, clubbing, rash, edema Neuro: Alert & oriented x 3, moves all 4 extremities w/o difficulty. Affect pleasant.  Assessment/Plan: 1. Chronic diastolic CHF: Echo in 7/24 showed EF 55-60%, mild LVH, normal RV, moderate MR, severe biatrial enlargement, moderate AI, severe TR.  Echo today on my review showed EF 55-60%, mild LVH, mild RV dilation with mildly decreased RV systolic function, moderate MR, moderate-severe TR, PASP 48 mmHg, mild AI, no AS, moderate-severe LAE.  Her CHF may be due to long-standing atriopathy with dilation of the atria and development of significant TR as well as diastolic LV dysfunction with moderate presumably pulmonary venous hypertension. NYHA II. She is not volume overloaded. I dont think dyspnea is related to HF.  - Continue bumetanide  3 mg qam/2 mg qpm. Recent labs reviewed and are stable. - Continue Farxiga  10 mg daily.  - She stopped spironolactone  because she felt like it triggered AF.   2. Tricuspid regurgitation: Severe on echo from 7/24. Moderate to severe on recent Echo.  Suspect atrial functional TR with severe right atrial enlargement in setting of atrial fibrillation.  - She may be a candidate for Triclip.  However, with minimal symptoms would hold off for the time being.   3. Atrial fibrillation: Paroxysmal. Of not she has been on flecainide  >2 years despite structural heart disease. No recent A fib. Regular on exam.  - Continue flecainide  50 mg bid.  - Continue current Toprol  XL and diltiazem  CD.  - Continue Xarelto  15 mg daily. No bleeding issues 4. Sick sinus syndrome: Has MDT PPM.  - Interrogation as above  Follow up in 6 months Dr. Rolan.       Harlene HER[facility] FNP-BC  11/14/2023

## 2023-11-16 LAB — CUP PACEART REMOTE DEVICE CHECK
Battery Remaining Longevity: 74 mo
Battery Voltage: 2.97 V
Brady Statistic AP VP Percent: 94.57 %
Brady Statistic AP VS Percent: 0.58 %
Brady Statistic AS VP Percent: 2.37 %
Brady Statistic AS VS Percent: 2.48 %
Brady Statistic RA Percent Paced: 96.35 %
Brady Statistic RV Percent Paced: 96.92 %
Date Time Interrogation Session: 20251006010231
Implantable Lead Connection Status: 753985
Implantable Lead Connection Status: 753985
Implantable Lead Implant Date: 20101109
Implantable Lead Implant Date: 20101109
Implantable Lead Location: 753859
Implantable Lead Location: 753860
Implantable Lead Model: 4469
Implantable Lead Model: 4470
Implantable Lead Serial Number: 535369
Implantable Lead Serial Number: 657967
Implantable Pulse Generator Implant Date: 20210708
Lead Channel Impedance Value: 247 Ohm
Lead Channel Impedance Value: 380 Ohm
Lead Channel Impedance Value: 399 Ohm
Lead Channel Impedance Value: 418 Ohm
Lead Channel Pacing Threshold Amplitude: 0.625 V
Lead Channel Pacing Threshold Amplitude: 1 V
Lead Channel Pacing Threshold Pulse Width: 0.4 ms
Lead Channel Pacing Threshold Pulse Width: 0.4 ms
Lead Channel Sensing Intrinsic Amplitude: 0.125 mV
Lead Channel Sensing Intrinsic Amplitude: 0.125 mV
Lead Channel Sensing Intrinsic Amplitude: 4 mV
Lead Channel Sensing Intrinsic Amplitude: 4 mV
Lead Channel Setting Pacing Amplitude: 2.5 V
Lead Channel Setting Pacing Amplitude: 2.5 V
Lead Channel Setting Pacing Pulse Width: 0.4 ms
Lead Channel Setting Sensing Sensitivity: 1.2 mV
Zone Setting Status: 755011
Zone Setting Status: 755011

## 2023-11-17 ENCOUNTER — Other Ambulatory Visit (HOSPITAL_BASED_OUTPATIENT_CLINIC_OR_DEPARTMENT_OTHER): Payer: Self-pay

## 2023-11-17 ENCOUNTER — Other Ambulatory Visit (HOSPITAL_BASED_OUTPATIENT_CLINIC_OR_DEPARTMENT_OTHER): Payer: Self-pay | Admitting: Cardiovascular Disease

## 2023-11-17 NOTE — Progress Notes (Signed)
 Remote PPM Transmission

## 2023-11-21 ENCOUNTER — Other Ambulatory Visit (HOSPITAL_BASED_OUTPATIENT_CLINIC_OR_DEPARTMENT_OTHER): Payer: Self-pay

## 2023-11-21 ENCOUNTER — Ambulatory Visit: Payer: Self-pay | Admitting: Cardiovascular Disease

## 2023-11-21 MED ORDER — METOPROLOL SUCCINATE ER 50 MG PO TB24
75.0000 mg | ORAL_TABLET | Freq: Two times a day (BID) | ORAL | 3 refills | Status: AC
  Filled 2023-11-21: qty 270, 90d supply, fill #0
  Filled 2024-02-18: qty 270, 90d supply, fill #1

## 2023-11-21 NOTE — Progress Notes (Signed)
 Remote PPM Transmission

## 2023-11-23 ENCOUNTER — Ambulatory Visit: Payer: Self-pay | Admitting: Cardiovascular Disease

## 2023-12-06 ENCOUNTER — Other Ambulatory Visit: Payer: Self-pay | Admitting: Internal Medicine

## 2023-12-06 DIAGNOSIS — N281 Cyst of kidney, acquired: Secondary | ICD-10-CM

## 2023-12-06 DIAGNOSIS — K862 Cyst of pancreas: Secondary | ICD-10-CM

## 2023-12-07 ENCOUNTER — Other Ambulatory Visit (HOSPITAL_BASED_OUTPATIENT_CLINIC_OR_DEPARTMENT_OTHER): Payer: Self-pay

## 2023-12-07 MED ORDER — FLUZONE HIGH-DOSE 0.5 ML IM SUSY
0.5000 mL | PREFILLED_SYRINGE | Freq: Once | INTRAMUSCULAR | 0 refills | Status: AC
  Filled 2023-12-07: qty 0.5, 1d supply, fill #0

## 2023-12-08 ENCOUNTER — Other Ambulatory Visit (HOSPITAL_BASED_OUTPATIENT_CLINIC_OR_DEPARTMENT_OTHER): Payer: Self-pay

## 2023-12-13 DIAGNOSIS — K080 Exfoliation of teeth due to systemic causes: Secondary | ICD-10-CM | POA: Diagnosis not present

## 2023-12-15 ENCOUNTER — Ambulatory Visit
Admission: RE | Admit: 2023-12-15 | Discharge: 2023-12-15 | Disposition: A | Discharge status: Home / Self Care | Source: Ambulatory Visit | Attending: Internal Medicine | Admitting: Internal Medicine

## 2023-12-15 ENCOUNTER — Other Ambulatory Visit (HOSPITAL_BASED_OUTPATIENT_CLINIC_OR_DEPARTMENT_OTHER): Payer: Self-pay

## 2023-12-15 DIAGNOSIS — K862 Cyst of pancreas: Secondary | ICD-10-CM | POA: Diagnosis not present

## 2023-12-15 DIAGNOSIS — N281 Cyst of kidney, acquired: Secondary | ICD-10-CM

## 2023-12-15 MED ORDER — IOPAMIDOL (ISOVUE-370) INJECTION 76%: 68.0000 mL | Freq: Once | INTRAVENOUS | Status: DC | PRN

## 2023-12-15 MED ORDER — IOPAMIDOL (ISOVUE-370) INJECTION 76%
68.0000 mL | Freq: Once | INTRAVENOUS | Status: AC | PRN
  Administered 2023-12-15: 68 mL via INTRAVENOUS

## 2023-12-16 ENCOUNTER — Other Ambulatory Visit (HOSPITAL_BASED_OUTPATIENT_CLINIC_OR_DEPARTMENT_OTHER): Payer: Self-pay

## 2023-12-19 ENCOUNTER — Other Ambulatory Visit (HOSPITAL_BASED_OUTPATIENT_CLINIC_OR_DEPARTMENT_OTHER): Payer: Self-pay

## 2023-12-19 ENCOUNTER — Other Ambulatory Visit: Payer: Self-pay

## 2023-12-22 ENCOUNTER — Other Ambulatory Visit (HOSPITAL_BASED_OUTPATIENT_CLINIC_OR_DEPARTMENT_OTHER): Payer: Self-pay

## 2024-01-09 ENCOUNTER — Other Ambulatory Visit (HOSPITAL_BASED_OUTPATIENT_CLINIC_OR_DEPARTMENT_OTHER): Payer: Self-pay

## 2024-01-10 ENCOUNTER — Other Ambulatory Visit (HOSPITAL_BASED_OUTPATIENT_CLINIC_OR_DEPARTMENT_OTHER): Payer: Self-pay

## 2024-01-10 DIAGNOSIS — L814 Other melanin hyperpigmentation: Secondary | ICD-10-CM | POA: Diagnosis not present

## 2024-01-10 DIAGNOSIS — Z85828 Personal history of other malignant neoplasm of skin: Secondary | ICD-10-CM | POA: Diagnosis not present

## 2024-01-10 DIAGNOSIS — L570 Actinic keratosis: Secondary | ICD-10-CM | POA: Diagnosis not present

## 2024-01-10 DIAGNOSIS — L821 Other seborrheic keratosis: Secondary | ICD-10-CM | POA: Diagnosis not present

## 2024-01-10 DIAGNOSIS — L565 Disseminated superficial actinic porokeratosis (DSAP): Secondary | ICD-10-CM | POA: Diagnosis not present

## 2024-01-18 ENCOUNTER — Other Ambulatory Visit (HOSPITAL_BASED_OUTPATIENT_CLINIC_OR_DEPARTMENT_OTHER): Payer: Self-pay

## 2024-01-18 ENCOUNTER — Other Ambulatory Visit: Payer: Self-pay

## 2024-01-18 MED ORDER — AMITRIPTYLINE HCL 25 MG PO TABS
25.0000 mg | ORAL_TABLET | Freq: Every day | ORAL | 3 refills | Status: AC
  Filled 2024-01-18: qty 30, 30d supply, fill #0
  Filled 2024-02-06 – 2024-02-15 (×2): qty 30, 30d supply, fill #1
  Filled 2024-03-15: qty 30, 30d supply, fill #2

## 2024-01-19 ENCOUNTER — Other Ambulatory Visit (HOSPITAL_BASED_OUTPATIENT_CLINIC_OR_DEPARTMENT_OTHER): Payer: Self-pay

## 2024-01-30 ENCOUNTER — Other Ambulatory Visit (HOSPITAL_BASED_OUTPATIENT_CLINIC_OR_DEPARTMENT_OTHER): Payer: Self-pay | Admitting: Cardiovascular Disease

## 2024-01-30 ENCOUNTER — Other Ambulatory Visit (HOSPITAL_BASED_OUTPATIENT_CLINIC_OR_DEPARTMENT_OTHER): Payer: Self-pay

## 2024-01-30 DIAGNOSIS — I480 Paroxysmal atrial fibrillation: Secondary | ICD-10-CM

## 2024-01-31 ENCOUNTER — Other Ambulatory Visit (HOSPITAL_BASED_OUTPATIENT_CLINIC_OR_DEPARTMENT_OTHER): Payer: Self-pay

## 2024-01-31 MED ORDER — DILTIAZEM HCL ER COATED BEADS 360 MG PO CP24
360.0000 mg | ORAL_CAPSULE | Freq: Every day | ORAL | 2 refills | Status: AC
  Filled 2024-01-31: qty 90, 90d supply, fill #0

## 2024-02-06 ENCOUNTER — Other Ambulatory Visit: Payer: Self-pay | Admitting: Cardiology

## 2024-02-06 ENCOUNTER — Other Ambulatory Visit (HOSPITAL_BASED_OUTPATIENT_CLINIC_OR_DEPARTMENT_OTHER): Payer: Self-pay

## 2024-02-06 ENCOUNTER — Other Ambulatory Visit: Payer: Self-pay

## 2024-02-06 MED ORDER — RIVAROXABAN 15 MG PO TABS
15.0000 mg | ORAL_TABLET | Freq: Every day | ORAL | 2 refills | Status: AC
  Filled 2024-02-06: qty 90, 90d supply, fill #0

## 2024-02-10 ENCOUNTER — Other Ambulatory Visit (HOSPITAL_BASED_OUTPATIENT_CLINIC_OR_DEPARTMENT_OTHER): Payer: Self-pay

## 2024-02-13 ENCOUNTER — Ambulatory Visit (INDEPENDENT_AMBULATORY_CARE_PROVIDER_SITE_OTHER): Payer: Self-pay

## 2024-02-13 DIAGNOSIS — I480 Paroxysmal atrial fibrillation: Secondary | ICD-10-CM | POA: Diagnosis not present

## 2024-02-15 ENCOUNTER — Other Ambulatory Visit (HOSPITAL_BASED_OUTPATIENT_CLINIC_OR_DEPARTMENT_OTHER): Payer: Self-pay

## 2024-02-15 LAB — CUP PACEART REMOTE DEVICE CHECK
Battery Remaining Longevity: 71 mo
Battery Voltage: 2.97 V
Brady Statistic AP VP Percent: 95.5 %
Brady Statistic AP VS Percent: 0.73 %
Brady Statistic AS VP Percent: 1.03 %
Brady Statistic AS VS Percent: 2.74 %
Brady Statistic RA Percent Paced: 97.48 %
Brady Statistic RV Percent Paced: 96.48 %
Date Time Interrogation Session: 20260105041111
Implantable Lead Connection Status: 753985
Implantable Lead Connection Status: 753985
Implantable Lead Implant Date: 20101109
Implantable Lead Implant Date: 20101109
Implantable Lead Location: 753859
Implantable Lead Location: 753860
Implantable Lead Model: 4469
Implantable Lead Model: 4470
Implantable Lead Serial Number: 535369
Implantable Lead Serial Number: 657967
Implantable Pulse Generator Implant Date: 20210708
Lead Channel Impedance Value: 266 Ohm
Lead Channel Impedance Value: 399 Ohm
Lead Channel Impedance Value: 399 Ohm
Lead Channel Impedance Value: 418 Ohm
Lead Channel Pacing Threshold Amplitude: 0.5 V
Lead Channel Pacing Threshold Amplitude: 1 V
Lead Channel Pacing Threshold Pulse Width: 0.4 ms
Lead Channel Pacing Threshold Pulse Width: 0.4 ms
Lead Channel Sensing Intrinsic Amplitude: 0.125 mV
Lead Channel Sensing Intrinsic Amplitude: 0.125 mV
Lead Channel Sensing Intrinsic Amplitude: 4.625 mV
Lead Channel Sensing Intrinsic Amplitude: 4.625 mV
Lead Channel Setting Pacing Amplitude: 2.5 V
Lead Channel Setting Pacing Amplitude: 2.5 V
Lead Channel Setting Pacing Pulse Width: 0.4 ms
Lead Channel Setting Sensing Sensitivity: 1.2 mV
Zone Setting Status: 755011
Zone Setting Status: 755011

## 2024-02-16 NOTE — Progress Notes (Signed)
 Remote PPM Transmission

## 2024-02-17 ENCOUNTER — Other Ambulatory Visit (HOSPITAL_BASED_OUTPATIENT_CLINIC_OR_DEPARTMENT_OTHER): Payer: Self-pay

## 2024-02-18 ENCOUNTER — Ambulatory Visit: Payer: Self-pay | Admitting: Cardiovascular Disease

## 2024-02-20 ENCOUNTER — Other Ambulatory Visit (HOSPITAL_BASED_OUTPATIENT_CLINIC_OR_DEPARTMENT_OTHER): Payer: Self-pay

## 2024-02-21 ENCOUNTER — Other Ambulatory Visit (HOSPITAL_BASED_OUTPATIENT_CLINIC_OR_DEPARTMENT_OTHER): Payer: Self-pay

## 2024-02-21 ENCOUNTER — Other Ambulatory Visit: Payer: Self-pay

## 2024-02-22 ENCOUNTER — Other Ambulatory Visit (HOSPITAL_BASED_OUTPATIENT_CLINIC_OR_DEPARTMENT_OTHER): Payer: Self-pay

## 2024-02-23 ENCOUNTER — Other Ambulatory Visit (HOSPITAL_COMMUNITY): Payer: Self-pay

## 2024-02-25 ENCOUNTER — Other Ambulatory Visit (HOSPITAL_BASED_OUTPATIENT_CLINIC_OR_DEPARTMENT_OTHER): Payer: Self-pay

## 2024-03-02 ENCOUNTER — Ambulatory Visit (HOSPITAL_BASED_OUTPATIENT_CLINIC_OR_DEPARTMENT_OTHER): Admitting: Cardiovascular Disease

## 2024-03-02 ENCOUNTER — Encounter (HOSPITAL_BASED_OUTPATIENT_CLINIC_OR_DEPARTMENT_OTHER): Payer: Self-pay | Admitting: Cardiovascular Disease

## 2024-03-02 VITALS — BP 110/60 | HR 68 | Ht 60.0 in | Wt 117.9 lb

## 2024-03-02 DIAGNOSIS — Z5181 Encounter for therapeutic drug level monitoring: Secondary | ICD-10-CM

## 2024-03-02 DIAGNOSIS — I10 Essential (primary) hypertension: Secondary | ICD-10-CM

## 2024-03-02 NOTE — Patient Instructions (Signed)
 Medication Instructions:  OK TO HOLD YOUR AFTERNOON DOSE OF BUMEX  ON MONDAY, WEDNESDAY, AND FRIDAY IF YOU ARE GETTING TOO DRY   *If you need a refill on your cardiac medications before your next appointment, please call your pharmacy*  Lab Work: BMET SOON  If you have labs (blood work) drawn today and your tests are completely normal, you will receive your results only by: MyChart Message (if you have MyChart) OR A paper copy in the mail If you have any lab test that is abnormal or we need to change your treatment, we will call you to review the results.  Testing/Procedures: NONE  Follow-Up: At [facility], you and your health needs are our priority.  As part of our continuing mission to provide you with exceptional heart care, our providers are all part of one team.  This team includes your primary Cardiologist (physician) and Advanced Practice Providers or APPs (Physician Assistants and Nurse Practitioners) who all work together to provide you with the care you need, when you need it.  Your next appointment:   4 month(s)  Provider:   Annabella Scarce, MD or Reche Finder, NP    We recommend signing up for the patient portal called MyChart.  Sign up information is provided on this After Visit Summary.  MyChart is used to connect with patients for Virtual Visits (Telemedicine).  Patients are able to view lab/test results, encounter notes, upcoming appointments, etc.  Non-urgent messages can be sent to your provider as well.   To learn more about what you can do with MyChart, go to [email or web address].   Other Instructions

## 2024-03-02 NOTE — Progress Notes (Unsigned)
 " Cardiology Office Note:  .   Date:  03/02/2024  ID:  Kathryn Gibson, DOB Apr 02, 1933, MRN [id number] PCP: Kathryn Santos, MD  [facility] Providers Cardiologist:  Kathryn Scarce, MD Electrophysiologist:  Kathryn FORBES Furbish, MD { Click to update primary MD,subspecialty MD or APP then REFRESH:1}   History of Present Illness: .   Kathryn Gibson is a 89 y.o. female with a hx of atrial fibrillation s/p ablation, SSS s/p PPM, severe tricuspid regurgitation, moderate mitral regurgitation, aortic atherosclerosis, asymptomatic coronary calcifications, recurrent epistaxis, and hypertension, who presents for follow-up.  Ms. Kathryn Gibson was previously a patient of Dr. Dominick.  She underwent ablation for atrial fibrillation on 07/01/15.  She had an episode of atrial fibrillation 12/2015.  Amiodarone  was previously increased due to atrial tachycardia.  This was discontinued due to elevated LFTs which subsequently normalized.  She saw Dr. Loni 07/2018 and reported exertional dyspnea.  Dr. Loni offered stress testing which she wanted to think about.  Her device was interrogated 01/08/19 and was nearing ERI.  There were no episodes of atrial fibrillation noted.   Her generator was changed out 08/2019.  Afterward she reported feeling poorly and felt like her heart was racing, though is in sinus rhythm in the 90s.  Metoprolol  was increased.  She saw Dr. Kelsie on 05/2020 and was doing well.  Her A. fib burden was at 3.4%.  On her remote check 08/2020 it was 10.6%. She followed up with EP on 11/2020 and they did not make any changes. She had some chest tightness that was thought to be due to her thoracic spine fracture. She had a nuclear stress test 09/2020 that was normal.   She reported some LE edema. BNP was 310. Lasix  was increased to 40 mg. She followed up with EP 05/2021 and was doing well with minimal Afib. She was previously on 40 mg of Lasix  every day and felt better.  However she had mild renal  dysfunction.  We changed her Lasix  to 40 mg on Monday, Wednesday, Friday, and continued with 20 mg on the other days. At her follow-up 12/2021 with EP she was doing well.    At her appointment 04/2022 she was recovering from COVID-19.  She was seen 06/2022 after multiple MyChart messages reporting shortness of breath and edema.  She took increasing doses of torsemide  with symptomatic improvement.  She saw Kathryn Finder, NP 06/2022 and was doing better.  She is taking torsemide  20 mg with an additional 20 mg as needed.  Kathryn Gibson recommended that she start Farxiga .  BNP has been consistently in the 500s despite titrating torsemide .  GFR has been between 41 and 52.  Echo 08/2022 revealed increasing tricuspid regurgitation, which was now severe.  LVEF was 55-60% with grade 2 diastolic dysfunction.  She had moderate mitral regurgitation.  She was also noted to have aortic atherosclerosis.   At her visit 10/27/22 she was feeling poorly and volume overloaded.  Bumex  was increased to bid and symptoms improved at follow up the following week. She called the office 12/2022 with increasing shortness of breath and was instructed to take 1.5 torsemide  tablets.  She saw Dr. Rolan in heart failure clinic 12/2022 with concern that her heart failure symptoms may due to her severe TR, diastolic dysfunction, atriopathy and pulmonary venous hypertension.  She had JVD  though her Reds vest was not elevated at 29%.  He added spironolactone  to her Bumex  and Farxiga .  There was consideration for Tri clip.  However given her normal-appearing right ventricle he elected to continue with medical management.  She saw Dr. Nancey 12/2022 and afib burdon was <1%. There has been discussion about stopping flecainide  due to underlying structural heart disease, but she declines.    Ms. Kathryn Gibson followed up with Dr. Rolan 02/2023 had discontinued her spironolactone  because she felt that it triggered her atrial fibrillation. At her visit 05/2023 she  noted 7lb weight gain and shortness of breath.  Echo 06/2023 revealed LVEF 55-60% with mild LVH, mild RV dilated, mildly reduced RV function, mod MR, mod-severe TR, PASP 48.  She has followed in CHF clinic and volume status was stable per ReDs vest.  Her shortness of breath was not thought to be related to HF.  They elected to hold off on tri-clip due to being euvolmic and lack of symptopms.   At her visit 10/2023 she was struggling with weight gain.     Discussed the use of AI scribe software for clinical note transcription with the patient, who gave verbal consent to proceed.  History of Present Illness     ROS:  As per HPI  Studies Reviewed: .        *** Risk Assessment/Calculations:   {Does this patient have ATRIAL FIBRILLATION?:[id number]}         Physical Exam:   VS:  BP 110/60 (BP Location: Left Arm, Patient Position: Sitting, Cuff Size: Normal)   Pulse 68   Ht 5' (1.524 m)   Wt 117 lb 14.4 oz (53.5 kg)   BMI 23.03 kg/m  , BMI Body mass index is 23.03 kg/m. GENERAL:  Well appearing HEENT: Pupils equal round and reactive, fundi not visualized, oral mucosa unremarkable NECK:  No jugular venous distention, waveform within normal limits, carotid upstroke brisk and symmetric, no bruits, no thyromegaly LYMPHATICS:  No cervical adenopathy LUNGS:  Clear to auscultation bilaterally HEART:  RRR.  PMI not displaced or sustained,S1 and S2 within normal limits, no S3, no S4, no clicks, no rubs, *** murmurs ABD:  Flat, positive bowel sounds normal in frequency in pitch, no bruits, no rebound, no guarding, no midline pulsatile mass, no hepatomegaly, no splenomegaly EXT:  2 plus pulses throughout, no edema, no cyanosis no clubbing SKIN:  No rashes no nodules NEURO:  Cranial nerves II through XII grossly intact, motor grossly intact throughout PSYCH:  Cognitively intact, oriented to person place and time   ASSESSMENT AND PLAN: .   *** Assessment and Plan Assessment &  Plan         {Are you ordering a CV Procedure (e.g. stress test, cath, DCCV, TEE, etc)?   Press F2        :[id number]}  Dispo: ***  Signed, Kathryn Scarce, MD   "

## 2024-03-08 ENCOUNTER — Other Ambulatory Visit (HOSPITAL_BASED_OUTPATIENT_CLINIC_OR_DEPARTMENT_OTHER): Payer: Self-pay

## 2024-03-13 ENCOUNTER — Other Ambulatory Visit (HOSPITAL_BASED_OUTPATIENT_CLINIC_OR_DEPARTMENT_OTHER): Payer: Self-pay

## 2024-03-13 LAB — BASIC METABOLIC PANEL WITH GFR
BUN/Creatinine Ratio: 17 (ref 12–28)
BUN: 23 mg/dL (ref 8–27)
CO2: 23 mmol/L (ref 20–29)
Calcium: 9.7 mg/dL (ref 8.7–10.3)
Chloride: 96 mmol/L (ref 96–106)
Creatinine, Ser: 1.36 mg/dL — ABNORMAL HIGH (ref 0.57–1.00)
Glucose: 88 mg/dL (ref 70–99)
Potassium: 4.1 mmol/L (ref 3.5–5.2)
Sodium: 140 mmol/L (ref 134–144)
eGFR: 37 mL/min/{1.73_m2} — ABNORMAL LOW

## 2024-03-16 ENCOUNTER — Other Ambulatory Visit (HOSPITAL_BASED_OUTPATIENT_CLINIC_OR_DEPARTMENT_OTHER): Payer: Self-pay

## 2024-03-16 ENCOUNTER — Other Ambulatory Visit: Payer: Self-pay

## 2024-05-02 ENCOUNTER — Ambulatory Visit: Admitting: Cardiovascular Disease

## 2024-06-26 ENCOUNTER — Ambulatory Visit (HOSPITAL_BASED_OUTPATIENT_CLINIC_OR_DEPARTMENT_OTHER): Admitting: Cardiovascular Disease
